# Patient Record
Sex: Female | Born: 1955 | Race: Black or African American | Hispanic: No | Smoking: Current every day smoker
Health system: Southern US, Community
[De-identification: ages and names within clinical notes are randomized; demographics above are authoritative.]

## PROBLEM LIST (undated history)

## (undated) DIAGNOSIS — K5790 Diverticulosis of intestine, part unspecified, without perforation or abscess without bleeding: Secondary | ICD-10-CM

## (undated) DIAGNOSIS — K529 Noninfective gastroenteritis and colitis, unspecified: Secondary | ICD-10-CM

## (undated) DIAGNOSIS — D126 Benign neoplasm of colon, unspecified: Secondary | ICD-10-CM

## (undated) DIAGNOSIS — K469 Unspecified abdominal hernia without obstruction or gangrene: Secondary | ICD-10-CM

## (undated) DIAGNOSIS — S82142A Displaced bicondylar fracture of left tibia, initial encounter for closed fracture: Secondary | ICD-10-CM

## (undated) DIAGNOSIS — E119 Type 2 diabetes mellitus without complications: Secondary | ICD-10-CM

## (undated) DIAGNOSIS — K627 Radiation proctitis: Secondary | ICD-10-CM

## (undated) DIAGNOSIS — E785 Hyperlipidemia, unspecified: Secondary | ICD-10-CM

## (undated) DIAGNOSIS — I10 Essential (primary) hypertension: Secondary | ICD-10-CM

## (undated) DIAGNOSIS — F1410 Cocaine abuse, uncomplicated: Secondary | ICD-10-CM

## (undated) DIAGNOSIS — C19 Malignant neoplasm of rectosigmoid junction: Secondary | ICD-10-CM

## (undated) DIAGNOSIS — Z9289 Personal history of other medical treatment: Secondary | ICD-10-CM

## (undated) DIAGNOSIS — G629 Polyneuropathy, unspecified: Secondary | ICD-10-CM

## (undated) DIAGNOSIS — F32A Depression, unspecified: Secondary | ICD-10-CM

## (undated) DIAGNOSIS — G479 Sleep disorder, unspecified: Secondary | ICD-10-CM

## (undated) DIAGNOSIS — R159 Full incontinence of feces: Secondary | ICD-10-CM

## (undated) DIAGNOSIS — D649 Anemia, unspecified: Secondary | ICD-10-CM

## (undated) DIAGNOSIS — IMO0002 Reserved for concepts with insufficient information to code with codable children: Secondary | ICD-10-CM

## (undated) DIAGNOSIS — F329 Major depressive disorder, single episode, unspecified: Secondary | ICD-10-CM

## (undated) HISTORY — DX: Unspecified abdominal hernia without obstruction or gangrene: K46.9

## (undated) HISTORY — DX: Anemia, unspecified: D64.9

## (undated) HISTORY — DX: Hyperlipidemia, unspecified: E78.5

## (undated) HISTORY — PX: PORTACATH PLACEMENT: SHX2246

## (undated) HISTORY — DX: Malignant neoplasm of rectosigmoid junction: C19

## (undated) HISTORY — DX: Displaced bicondylar fracture of left tibia, initial encounter for closed fracture: S82.142A

## (undated) HISTORY — DX: Benign neoplasm of colon, unspecified: D12.6

## (undated) HISTORY — DX: Polyneuropathy, unspecified: G62.9

## (undated) HISTORY — DX: Diverticulosis of intestine, part unspecified, without perforation or abscess without bleeding: K57.90

## (undated) HISTORY — DX: Essential (primary) hypertension: I10

## (undated) HISTORY — DX: Reserved for concepts with insufficient information to code with codable children: IMO0002

## (undated) HISTORY — DX: Cocaine abuse, uncomplicated: F14.10

## (undated) HISTORY — PX: HERNIA REPAIR: SHX51

## (undated) HISTORY — PX: FINGER FRACTURE SURGERY: SHX638

## (undated) HISTORY — DX: Noninfective gastroenteritis and colitis, unspecified: K52.9

## (undated) HISTORY — DX: Radiation proctitis: K62.7

---

## 2008-10-24 ENCOUNTER — Inpatient Hospital Stay (HOSPITAL_COMMUNITY): Admission: EM | Admit: 2008-10-24 | Discharge: 2008-10-27 | Payer: Self-pay | Admitting: Emergency Medicine

## 2008-10-25 ENCOUNTER — Encounter: Payer: Self-pay | Admitting: Internal Medicine

## 2008-10-25 ENCOUNTER — Encounter (INDEPENDENT_AMBULATORY_CARE_PROVIDER_SITE_OTHER): Payer: Self-pay | Admitting: Gastroenterology

## 2008-10-25 ENCOUNTER — Encounter (INDEPENDENT_AMBULATORY_CARE_PROVIDER_SITE_OTHER): Payer: Self-pay | Admitting: *Deleted

## 2008-10-27 ENCOUNTER — Encounter (INDEPENDENT_AMBULATORY_CARE_PROVIDER_SITE_OTHER): Payer: Self-pay | Admitting: *Deleted

## 2008-11-01 ENCOUNTER — Ambulatory Visit: Payer: Self-pay | Admitting: Oncology

## 2008-11-02 LAB — CBC WITH DIFFERENTIAL/PLATELET
BASO%: 0.7 % (ref 0.0–2.0)
Basophils Absolute: 0.1 10*3/uL (ref 0.0–0.1)
EOS%: 2.5 % (ref 0.0–7.0)
Eosinophils Absolute: 0.3 10*3/uL (ref 0.0–0.5)
HCT: 34.5 % — ABNORMAL LOW (ref 34.8–46.6)
HGB: 10.2 g/dL — ABNORMAL LOW (ref 11.6–15.9)
LYMPH%: 25.9 % (ref 14.0–49.7)
MCH: 20.6 pg — ABNORMAL LOW (ref 25.1–34.0)
MCHC: 29.6 g/dL — ABNORMAL LOW (ref 31.5–36.0)
MCV: 69.6 fL — ABNORMAL LOW (ref 79.5–101.0)
MONO#: 0.8 10*3/uL (ref 0.1–0.9)
MONO%: 8.4 % (ref 0.0–14.0)
NEUT#: 6.2 10*3/uL (ref 1.5–6.5)
NEUT%: 62.5 % (ref 38.4–76.8)
Platelets: 443 10*3/uL — ABNORMAL HIGH (ref 145–400)
RBC: 4.96 10*6/uL (ref 3.70–5.45)
RDW: 27.9 % — ABNORMAL HIGH (ref 11.2–14.5)
WBC: 9.9 10*3/uL (ref 3.9–10.3)
lymph#: 2.6 10*3/uL (ref 0.9–3.3)
nRBC: 0 % (ref 0–0)

## 2008-11-02 LAB — COMPREHENSIVE METABOLIC PANEL
ALT: 15 U/L (ref 0–35)
AST: 20 U/L (ref 0–37)
Albumin: 3.6 g/dL (ref 3.5–5.2)
Alkaline Phosphatase: 87 U/L (ref 39–117)
BUN: 9 mg/dL (ref 6–23)
CO2: 25 mEq/L (ref 19–32)
Calcium: 9 mg/dL (ref 8.4–10.5)
Chloride: 109 mEq/L (ref 96–112)
Creatinine, Ser: 0.69 mg/dL (ref 0.40–1.20)
Glucose, Bld: 101 mg/dL — ABNORMAL HIGH (ref 70–99)
Potassium: 4.2 mEq/L (ref 3.5–5.3)
Sodium: 141 mEq/L (ref 135–145)
Total Bilirubin: 1 mg/dL (ref 0.3–1.2)
Total Protein: 6.4 g/dL (ref 6.0–8.3)

## 2008-11-02 LAB — CEA: CEA: 1.2 ng/mL (ref 0.0–5.0)

## 2008-11-06 ENCOUNTER — Ambulatory Visit (HOSPITAL_COMMUNITY): Admission: RE | Admit: 2008-11-06 | Discharge: 2008-11-06 | Payer: Self-pay | Admitting: General Surgery

## 2008-11-07 ENCOUNTER — Ambulatory Visit: Admission: RE | Admit: 2008-11-07 | Discharge: 2008-12-30 | Payer: Self-pay | Admitting: Radiation Oncology

## 2008-11-13 ENCOUNTER — Ambulatory Visit (HOSPITAL_COMMUNITY): Admission: RE | Admit: 2008-11-13 | Discharge: 2008-11-13 | Payer: Self-pay | Admitting: Oncology

## 2008-11-27 LAB — CBC WITH DIFFERENTIAL/PLATELET
BASO%: 0.7 % (ref 0.0–2.0)
Basophils Absolute: 0 10*3/uL (ref 0.0–0.1)
EOS%: 2.7 % (ref 0.0–7.0)
Eosinophils Absolute: 0.2 10*3/uL (ref 0.0–0.5)
HCT: 32.4 % — ABNORMAL LOW (ref 34.8–46.6)
HGB: 9.7 g/dL — ABNORMAL LOW (ref 11.6–15.9)
LYMPH%: 24 % (ref 14.0–49.7)
MCH: 22.2 pg — ABNORMAL LOW (ref 25.1–34.0)
MCHC: 29.9 g/dL — ABNORMAL LOW (ref 31.5–36.0)
MCV: 74.1 fL — ABNORMAL LOW (ref 79.5–101.0)
MONO#: 0.4 10*3/uL (ref 0.1–0.9)
MONO%: 7.1 % (ref 0.0–14.0)
NEUT#: 3.7 10*3/uL (ref 1.5–6.5)
NEUT%: 65.5 % (ref 38.4–76.8)
Platelets: 360 10*3/uL (ref 145–400)
RBC: 4.37 10*6/uL (ref 3.70–5.45)
RDW: 27.4 % — ABNORMAL HIGH (ref 11.2–14.5)
WBC: 5.7 10*3/uL (ref 3.9–10.3)
lymph#: 1.4 10*3/uL (ref 0.9–3.3)

## 2008-11-27 LAB — COMPREHENSIVE METABOLIC PANEL
ALT: 13 U/L (ref 0–35)
AST: 16 U/L (ref 0–37)
Albumin: 4.1 g/dL (ref 3.5–5.2)
Alkaline Phosphatase: 90 U/L (ref 39–117)
BUN: 13 mg/dL (ref 6–23)
CO2: 22 mEq/L (ref 19–32)
Calcium: 9.6 mg/dL (ref 8.4–10.5)
Chloride: 108 mEq/L (ref 96–112)
Creatinine, Ser: 0.58 mg/dL (ref 0.40–1.20)
Glucose, Bld: 116 mg/dL — ABNORMAL HIGH (ref 70–99)
Potassium: 3.8 mEq/L (ref 3.5–5.3)
Sodium: 139 mEq/L (ref 135–145)
Total Bilirubin: 0.4 mg/dL (ref 0.3–1.2)
Total Protein: 6.4 g/dL (ref 6.0–8.3)

## 2008-12-04 ENCOUNTER — Ambulatory Visit: Payer: Self-pay | Admitting: Oncology

## 2008-12-04 LAB — CBC WITH DIFFERENTIAL/PLATELET
BASO%: 0.4 % (ref 0.0–2.0)
Basophils Absolute: 0 10*3/uL (ref 0.0–0.1)
EOS%: 2.5 % (ref 0.0–7.0)
Eosinophils Absolute: 0.1 10*3/uL (ref 0.0–0.5)
HCT: 31.7 % — ABNORMAL LOW (ref 34.8–46.6)
HGB: 9.8 g/dL — ABNORMAL LOW (ref 11.6–15.9)
LYMPH%: 14.6 % (ref 14.0–49.7)
MCH: 23.2 pg — ABNORMAL LOW (ref 25.1–34.0)
MCHC: 30.9 g/dL — ABNORMAL LOW (ref 31.5–36.0)
MCV: 75.1 fL — ABNORMAL LOW (ref 79.5–101.0)
MONO#: 0.5 10*3/uL (ref 0.1–0.9)
MONO%: 8.5 % (ref 0.0–14.0)
NEUT#: 4.1 10*3/uL (ref 1.5–6.5)
NEUT%: 74 % (ref 38.4–76.8)
Platelets: 257 10*3/uL (ref 145–400)
RBC: 4.22 10*6/uL (ref 3.70–5.45)
RDW: 27.8 % — ABNORMAL HIGH (ref 11.2–14.5)
WBC: 5.6 10*3/uL (ref 3.9–10.3)
lymph#: 0.8 10*3/uL — ABNORMAL LOW (ref 0.9–3.3)

## 2008-12-04 LAB — COMPREHENSIVE METABOLIC PANEL
ALT: 9 U/L (ref 0–35)
AST: 16 U/L (ref 0–37)
Albumin: 4 g/dL (ref 3.5–5.2)
Alkaline Phosphatase: 83 U/L (ref 39–117)
BUN: 13 mg/dL (ref 6–23)
CO2: 23 mEq/L (ref 19–32)
Calcium: 9.6 mg/dL (ref 8.4–10.5)
Chloride: 106 mEq/L (ref 96–112)
Creatinine, Ser: 0.65 mg/dL (ref 0.40–1.20)
Glucose, Bld: 146 mg/dL — ABNORMAL HIGH (ref 70–99)
Potassium: 4.2 mEq/L (ref 3.5–5.3)
Sodium: 140 mEq/L (ref 135–145)
Total Bilirubin: 0.6 mg/dL (ref 0.3–1.2)
Total Protein: 6.5 g/dL (ref 6.0–8.3)

## 2008-12-11 LAB — COMPREHENSIVE METABOLIC PANEL WITH GFR
ALT: 10 U/L (ref 0–35)
AST: 14 U/L (ref 0–37)
Albumin: 3.7 g/dL (ref 3.5–5.2)
Alkaline Phosphatase: 88 U/L (ref 39–117)
BUN: 13 mg/dL (ref 6–23)
CO2: 21 meq/L (ref 19–32)
Calcium: 9.3 mg/dL (ref 8.4–10.5)
Chloride: 108 meq/L (ref 96–112)
Creatinine, Ser: 0.67 mg/dL (ref 0.40–1.20)
Glucose, Bld: 99 mg/dL (ref 70–99)
Potassium: 4.2 meq/L (ref 3.5–5.3)
Sodium: 141 meq/L (ref 135–145)
Total Bilirubin: 0.4 mg/dL (ref 0.3–1.2)
Total Protein: 6.2 g/dL (ref 6.0–8.3)

## 2008-12-11 LAB — CBC WITH DIFFERENTIAL/PLATELET
BASO%: 0.3 % (ref 0.0–2.0)
Basophils Absolute: 0 10*3/uL (ref 0.0–0.1)
EOS%: 1.9 % (ref 0.0–7.0)
Eosinophils Absolute: 0.1 10*3/uL (ref 0.0–0.5)
HCT: 32.6 % — ABNORMAL LOW (ref 34.8–46.6)
HGB: 10 g/dL — ABNORMAL LOW (ref 11.6–15.9)
LYMPH%: 8.8 % — ABNORMAL LOW (ref 14.0–49.7)
MCH: 23.6 pg — ABNORMAL LOW (ref 25.1–34.0)
MCHC: 30.7 g/dL — ABNORMAL LOW (ref 31.5–36.0)
MCV: 77.1 fL — ABNORMAL LOW (ref 79.5–101.0)
MONO#: 0.6 10*3/uL (ref 0.1–0.9)
MONO%: 7.8 % (ref 0.0–14.0)
NEUT#: 5.8 10*3/uL (ref 1.5–6.5)
NEUT%: 81.2 % — ABNORMAL HIGH (ref 38.4–76.8)
Platelets: 374 10*3/uL (ref 145–400)
RBC: 4.23 10*6/uL (ref 3.70–5.45)
RDW: 28.2 % — ABNORMAL HIGH (ref 11.2–14.5)
WBC: 7.2 10*3/uL (ref 3.9–10.3)
lymph#: 0.6 10*3/uL — ABNORMAL LOW (ref 0.9–3.3)

## 2008-12-19 LAB — COMPREHENSIVE METABOLIC PANEL
ALT: 16 U/L (ref 0–35)
AST: 19 U/L (ref 0–37)
Albumin: 4 g/dL (ref 3.5–5.2)
Alkaline Phosphatase: 87 U/L (ref 39–117)
BUN: 13 mg/dL (ref 6–23)
CO2: 22 mEq/L (ref 19–32)
Calcium: 8.8 mg/dL (ref 8.4–10.5)
Chloride: 106 mEq/L (ref 96–112)
Creatinine, Ser: 0.84 mg/dL (ref 0.40–1.20)
Glucose, Bld: 121 mg/dL — ABNORMAL HIGH (ref 70–99)
Potassium: 3.5 mEq/L (ref 3.5–5.3)
Sodium: 141 mEq/L (ref 135–145)
Total Bilirubin: 0.9 mg/dL (ref 0.3–1.2)
Total Protein: 6.4 g/dL (ref 6.0–8.3)

## 2008-12-19 LAB — CBC WITH DIFFERENTIAL/PLATELET
BASO%: 0.7 % (ref 0.0–2.0)
Basophils Absolute: 0 10*3/uL (ref 0.0–0.1)
EOS%: 3 % (ref 0.0–7.0)
Eosinophils Absolute: 0.1 10*3/uL (ref 0.0–0.5)
HCT: 32.9 % — ABNORMAL LOW (ref 34.8–46.6)
HGB: 10.2 g/dL — ABNORMAL LOW (ref 11.6–15.9)
LYMPH%: 15.5 % (ref 14.0–49.7)
MCH: 24.5 pg — ABNORMAL LOW (ref 25.1–34.0)
MCHC: 31 g/dL — ABNORMAL LOW (ref 31.5–36.0)
MCV: 78.9 fL — ABNORMAL LOW (ref 79.5–101.0)
MONO#: 0.5 10*3/uL (ref 0.1–0.9)
MONO%: 10.9 % (ref 0.0–14.0)
NEUT#: 3.1 10*3/uL (ref 1.5–6.5)
NEUT%: 69.9 % (ref 38.4–76.8)
Platelets: 450 10*3/uL — ABNORMAL HIGH (ref 145–400)
RBC: 4.17 10*6/uL (ref 3.70–5.45)
RDW: 28.1 % — ABNORMAL HIGH (ref 11.2–14.5)
WBC: 4.4 10*3/uL (ref 3.9–10.3)
lymph#: 0.7 10*3/uL — ABNORMAL LOW (ref 0.9–3.3)

## 2008-12-25 LAB — CBC WITH DIFFERENTIAL/PLATELET
BASO%: 0.2 % (ref 0.0–2.0)
Basophils Absolute: 0 10*3/uL (ref 0.0–0.1)
EOS%: 1.4 % (ref 0.0–7.0)
Eosinophils Absolute: 0.1 10*3/uL (ref 0.0–0.5)
HCT: 35.4 % (ref 34.8–46.6)
HGB: 11 g/dL — ABNORMAL LOW (ref 11.6–15.9)
LYMPH%: 6.5 % — ABNORMAL LOW (ref 14.0–49.7)
MCH: 25.7 pg (ref 25.1–34.0)
MCHC: 31.1 g/dL — ABNORMAL LOW (ref 31.5–36.0)
MCV: 82.7 fL (ref 79.5–101.0)
MONO#: 0.7 10*3/uL (ref 0.1–0.9)
MONO%: 8 % (ref 0.0–14.0)
NEUT#: 7.3 10*3/uL — ABNORMAL HIGH (ref 1.5–6.5)
NEUT%: 83.9 % — ABNORMAL HIGH (ref 38.4–76.8)
Platelets: 312 10*3/uL (ref 145–400)
RBC: 4.28 10*6/uL (ref 3.70–5.45)
RDW: 28.3 % — ABNORMAL HIGH (ref 11.2–14.5)
WBC: 8.7 10*3/uL (ref 3.9–10.3)
lymph#: 0.6 10*3/uL — ABNORMAL LOW (ref 0.9–3.3)

## 2009-02-01 ENCOUNTER — Ambulatory Visit: Payer: Self-pay | Admitting: Oncology

## 2009-02-05 LAB — CBC WITH DIFFERENTIAL/PLATELET
BASO%: 0.1 % (ref 0.0–2.0)
Basophils Absolute: 0 10*3/uL (ref 0.0–0.1)
EOS%: 1.4 % (ref 0.0–7.0)
Eosinophils Absolute: 0.1 10*3/uL (ref 0.0–0.5)
HCT: 34.1 % — ABNORMAL LOW (ref 34.8–46.6)
HGB: 11 g/dL — ABNORMAL LOW (ref 11.6–15.9)
LYMPH%: 13.4 % — ABNORMAL LOW (ref 14.0–49.7)
MCH: 26.9 pg (ref 25.1–34.0)
MCHC: 32.2 g/dL (ref 31.5–36.0)
MCV: 83.5 fL (ref 79.5–101.0)
MONO#: 0.5 10*3/uL (ref 0.1–0.9)
MONO%: 7.7 % (ref 0.0–14.0)
NEUT#: 4.6 10*3/uL (ref 1.5–6.5)
NEUT%: 77.4 % — ABNORMAL HIGH (ref 38.4–76.8)
Platelets: 379 10*3/uL (ref 145–400)
RBC: 4.08 10*6/uL (ref 3.70–5.45)
RDW: 24 % — ABNORMAL HIGH (ref 11.2–14.5)
WBC: 5.9 10*3/uL (ref 3.9–10.3)
lymph#: 0.8 10*3/uL — ABNORMAL LOW (ref 0.9–3.3)

## 2009-02-05 LAB — COMPREHENSIVE METABOLIC PANEL
ALT: 17 U/L (ref 0–35)
AST: 18 U/L (ref 0–37)
Albumin: 4 g/dL (ref 3.5–5.2)
Alkaline Phosphatase: 90 U/L (ref 39–117)
BUN: 13 mg/dL (ref 6–23)
CO2: 22 mEq/L (ref 19–32)
Calcium: 9.3 mg/dL (ref 8.4–10.5)
Chloride: 108 mEq/L (ref 96–112)
Creatinine, Ser: 0.81 mg/dL (ref 0.40–1.20)
Glucose, Bld: 99 mg/dL (ref 70–99)
Potassium: 4.2 mEq/L (ref 3.5–5.3)
Sodium: 140 mEq/L (ref 135–145)
Total Bilirubin: 0.4 mg/dL (ref 0.3–1.2)
Total Protein: 6.8 g/dL (ref 6.0–8.3)

## 2009-02-05 LAB — CEA: CEA: 1.1 ng/mL (ref 0.0–5.0)

## 2009-03-07 ENCOUNTER — Encounter (INDEPENDENT_AMBULATORY_CARE_PROVIDER_SITE_OTHER): Payer: Self-pay | Admitting: General Surgery

## 2009-03-07 ENCOUNTER — Inpatient Hospital Stay (HOSPITAL_COMMUNITY): Admission: RE | Admit: 2009-03-07 | Discharge: 2009-03-12 | Payer: Self-pay | Admitting: General Surgery

## 2009-03-07 ENCOUNTER — Encounter (INDEPENDENT_AMBULATORY_CARE_PROVIDER_SITE_OTHER): Payer: Self-pay | Admitting: *Deleted

## 2009-03-07 HISTORY — PX: LOW ANTERIOR BOWEL RESECTION: SUR1240

## 2009-03-12 ENCOUNTER — Encounter (INDEPENDENT_AMBULATORY_CARE_PROVIDER_SITE_OTHER): Payer: Self-pay | Admitting: *Deleted

## 2009-04-02 ENCOUNTER — Ambulatory Visit: Payer: Self-pay | Admitting: Oncology

## 2009-04-04 LAB — CBC WITH DIFFERENTIAL/PLATELET
BASO%: 0.3 % (ref 0.0–2.0)
Basophils Absolute: 0 10*3/uL (ref 0.0–0.1)
EOS%: 0.9 % (ref 0.0–7.0)
Eosinophils Absolute: 0.1 10*3/uL (ref 0.0–0.5)
HCT: 32.7 % — ABNORMAL LOW (ref 34.8–46.6)
HGB: 9.6 g/dL — ABNORMAL LOW (ref 11.6–15.9)
LYMPH%: 12.7 % — ABNORMAL LOW (ref 14.0–49.7)
MCH: 23.6 pg — ABNORMAL LOW (ref 25.1–34.0)
MCHC: 29.4 g/dL — ABNORMAL LOW (ref 31.5–36.0)
MCV: 80.3 fL (ref 79.5–101.0)
MONO#: 0.6 10*3/uL (ref 0.1–0.9)
MONO%: 8.3 % (ref 0.0–14.0)
NEUT#: 5.3 10*3/uL (ref 1.5–6.5)
NEUT%: 77.8 % — ABNORMAL HIGH (ref 38.4–76.8)
Platelets: 469 10*3/uL — ABNORMAL HIGH (ref 145–400)
RBC: 4.07 10*6/uL (ref 3.70–5.45)
RDW: 18.4 % — ABNORMAL HIGH (ref 11.2–14.5)
WBC: 6.8 10*3/uL (ref 3.9–10.3)
lymph#: 0.9 10*3/uL (ref 0.9–3.3)

## 2009-04-04 LAB — COMPREHENSIVE METABOLIC PANEL
ALT: 8 U/L (ref 0–35)
AST: 13 U/L (ref 0–37)
Albumin: 4.2 g/dL (ref 3.5–5.2)
Alkaline Phosphatase: 100 U/L (ref 39–117)
BUN: 8 mg/dL (ref 6–23)
CO2: 24 mEq/L (ref 19–32)
Calcium: 9.7 mg/dL (ref 8.4–10.5)
Chloride: 105 mEq/L (ref 96–112)
Creatinine, Ser: 0.68 mg/dL (ref 0.40–1.20)
Glucose, Bld: 113 mg/dL — ABNORMAL HIGH (ref 70–99)
Potassium: 3.6 mEq/L (ref 3.5–5.3)
Sodium: 141 mEq/L (ref 135–145)
Total Bilirubin: 0.9 mg/dL (ref 0.3–1.2)
Total Protein: 7.4 g/dL (ref 6.0–8.3)

## 2009-04-04 LAB — CEA: CEA: 0.9 ng/mL (ref 0.0–5.0)

## 2009-04-14 ENCOUNTER — Ambulatory Visit: Payer: Self-pay | Admitting: Oncology

## 2009-04-14 LAB — HM MAMMOGRAPHY: HM Mammogram: NORMAL

## 2009-04-14 LAB — CONVERTED CEMR LAB

## 2009-04-16 LAB — CBC WITH DIFFERENTIAL/PLATELET
BASO%: 0.8 % (ref 0.0–2.0)
Basophils Absolute: 0.1 10*3/uL (ref 0.0–0.1)
EOS%: 2.7 % (ref 0.0–7.0)
Eosinophils Absolute: 0.2 10*3/uL (ref 0.0–0.5)
HCT: 35.6 % (ref 34.8–46.6)
HGB: 10.8 g/dL — ABNORMAL LOW (ref 11.6–15.9)
LYMPH%: 15.6 % (ref 14.0–49.7)
MCH: 24.2 pg — ABNORMAL LOW (ref 25.1–34.0)
MCHC: 30.3 g/dL — ABNORMAL LOW (ref 31.5–36.0)
MCV: 79.6 fL (ref 79.5–101.0)
MONO#: 0.5 10*3/uL (ref 0.1–0.9)
MONO%: 8.2 % (ref 0.0–14.0)
NEUT#: 4.8 10*3/uL (ref 1.5–6.5)
NEUT%: 72.7 % (ref 38.4–76.8)
Platelets: 543 10*3/uL — ABNORMAL HIGH (ref 145–400)
RBC: 4.47 10*6/uL (ref 3.70–5.45)
RDW: 20.2 % — ABNORMAL HIGH (ref 11.2–14.5)
WBC: 6.6 10*3/uL (ref 3.9–10.3)
lymph#: 1 10*3/uL (ref 0.9–3.3)
nRBC: 0 % (ref 0–0)

## 2009-04-17 LAB — COMPREHENSIVE METABOLIC PANEL
ALT: 8 U/L (ref 0–35)
AST: 20 U/L (ref 0–37)
Albumin: 4.2 g/dL (ref 3.5–5.2)
Alkaline Phosphatase: 96 U/L (ref 39–117)
BUN: 9 mg/dL (ref 6–23)
CO2: 18 mEq/L — ABNORMAL LOW (ref 19–32)
Calcium: 9.6 mg/dL (ref 8.4–10.5)
Chloride: 103 mEq/L (ref 96–112)
Creatinine, Ser: 0.67 mg/dL (ref 0.40–1.20)
Glucose, Bld: 104 mg/dL — ABNORMAL HIGH (ref 70–99)
Potassium: 4.5 mEq/L (ref 3.5–5.3)
Sodium: 138 mEq/L (ref 135–145)
Total Bilirubin: 0.7 mg/dL (ref 0.3–1.2)
Total Protein: 7 g/dL (ref 6.0–8.3)

## 2009-04-17 LAB — CEA: CEA: 0.6 ng/mL (ref 0.0–5.0)

## 2009-04-17 LAB — LACTATE DEHYDROGENASE: LDH: 234 U/L (ref 94–250)

## 2009-04-30 LAB — CBC WITH DIFFERENTIAL/PLATELET
BASO%: 0 % (ref 0.0–2.0)
Basophils Absolute: 0 10*3/uL (ref 0.0–0.1)
EOS%: 0.8 % (ref 0.0–7.0)
Eosinophils Absolute: 0 10*3/uL (ref 0.0–0.5)
HCT: 36.6 % (ref 34.8–46.6)
HGB: 11.6 g/dL (ref 11.6–15.9)
LYMPH%: 14.9 % (ref 14.0–49.7)
MCH: 25.9 pg (ref 25.1–34.0)
MCHC: 31.7 g/dL (ref 31.5–36.0)
MCV: 81.8 fL (ref 79.5–101.0)
MONO#: 0.6 10*3/uL (ref 0.1–0.9)
MONO%: 11.7 % (ref 0.0–14.0)
NEUT#: 4 10*3/uL (ref 1.5–6.5)
NEUT%: 72.6 % (ref 38.4–76.8)
Platelets: 387 10*3/uL (ref 145–400)
RBC: 4.47 10*6/uL (ref 3.70–5.45)
RDW: 24.9 % — ABNORMAL HIGH (ref 11.2–14.5)
WBC: 5.6 10*3/uL (ref 3.9–10.3)
lymph#: 0.8 10*3/uL — ABNORMAL LOW (ref 0.9–3.3)

## 2009-04-30 LAB — COMPREHENSIVE METABOLIC PANEL
ALT: 8 U/L (ref 0–35)
AST: 14 U/L (ref 0–37)
Albumin: 4.2 g/dL (ref 3.5–5.2)
Alkaline Phosphatase: 95 U/L (ref 39–117)
BUN: 10 mg/dL (ref 6–23)
CO2: 23 mEq/L (ref 19–32)
Calcium: 9.7 mg/dL (ref 8.4–10.5)
Chloride: 104 mEq/L (ref 96–112)
Creatinine, Ser: 0.74 mg/dL (ref 0.40–1.20)
Glucose, Bld: 138 mg/dL — ABNORMAL HIGH (ref 70–99)
Potassium: 4.3 mEq/L (ref 3.5–5.3)
Sodium: 139 mEq/L (ref 135–145)
Total Bilirubin: 0.7 mg/dL (ref 0.3–1.2)
Total Protein: 6.8 g/dL (ref 6.0–8.3)

## 2009-05-14 ENCOUNTER — Ambulatory Visit: Payer: Self-pay | Admitting: Oncology

## 2009-05-14 LAB — COMPREHENSIVE METABOLIC PANEL
ALT: 11 U/L (ref 0–35)
AST: 18 U/L (ref 0–37)
Albumin: 3.7 g/dL (ref 3.5–5.2)
Alkaline Phosphatase: 103 U/L (ref 39–117)
BUN: 9 mg/dL (ref 6–23)
CO2: 19 mEq/L (ref 19–32)
Calcium: 8.9 mg/dL (ref 8.4–10.5)
Chloride: 106 mEq/L (ref 96–112)
Creatinine, Ser: 0.62 mg/dL (ref 0.40–1.20)
Glucose, Bld: 104 mg/dL — ABNORMAL HIGH (ref 70–99)
Potassium: 3.6 mEq/L (ref 3.5–5.3)
Sodium: 139 mEq/L (ref 135–145)
Total Bilirubin: 0.5 mg/dL (ref 0.3–1.2)
Total Protein: 6.4 g/dL (ref 6.0–8.3)

## 2009-05-14 LAB — CBC WITH DIFFERENTIAL/PLATELET
BASO%: 0.4 % (ref 0.0–2.0)
Basophils Absolute: 0 10*3/uL (ref 0.0–0.1)
EOS%: 1.1 % (ref 0.0–7.0)
Eosinophils Absolute: 0.1 10*3/uL (ref 0.0–0.5)
HCT: 36.1 % (ref 34.8–46.6)
HGB: 11.1 g/dL — ABNORMAL LOW (ref 11.6–15.9)
LYMPH%: 14.5 % (ref 14.0–49.7)
MCH: 25.8 pg (ref 25.1–34.0)
MCHC: 30.7 g/dL — ABNORMAL LOW (ref 31.5–36.0)
MCV: 84 fL (ref 79.5–101.0)
MONO#: 0.7 10*3/uL (ref 0.1–0.9)
MONO%: 12.4 % (ref 0.0–14.0)
NEUT#: 4 10*3/uL (ref 1.5–6.5)
NEUT%: 71.6 % (ref 38.4–76.8)
Platelets: 218 10*3/uL (ref 145–400)
RBC: 4.3 10*6/uL (ref 3.70–5.45)
RDW: 22.6 % — ABNORMAL HIGH (ref 11.2–14.5)
WBC: 5.6 10*3/uL (ref 3.9–10.3)
lymph#: 0.8 10*3/uL — ABNORMAL LOW (ref 0.9–3.3)

## 2009-05-28 LAB — CBC WITH DIFFERENTIAL/PLATELET
BASO%: 0.5 % (ref 0.0–2.0)
Basophils Absolute: 0 10*3/uL (ref 0.0–0.1)
EOS%: 0.9 % (ref 0.0–7.0)
Eosinophils Absolute: 0.1 10*3/uL (ref 0.0–0.5)
HCT: 39.3 % (ref 34.8–46.6)
HGB: 12.4 g/dL (ref 11.6–15.9)
LYMPH%: 10.3 % — ABNORMAL LOW (ref 14.0–49.7)
MCH: 26.5 pg (ref 25.1–34.0)
MCHC: 31.6 g/dL (ref 31.5–36.0)
MCV: 84 fL (ref 79.5–101.0)
MONO#: 0.5 10*3/uL (ref 0.1–0.9)
MONO%: 9.2 % (ref 0.0–14.0)
NEUT#: 4.5 10*3/uL (ref 1.5–6.5)
NEUT%: 79.1 % — ABNORMAL HIGH (ref 38.4–76.8)
Platelets: 207 10*3/uL (ref 145–400)
RBC: 4.68 10*6/uL (ref 3.70–5.45)
RDW: 23.3 % — ABNORMAL HIGH (ref 11.2–14.5)
WBC: 5.7 10*3/uL (ref 3.9–10.3)
lymph#: 0.6 10*3/uL — ABNORMAL LOW (ref 0.9–3.3)
nRBC: 0 % (ref 0–0)

## 2009-05-28 LAB — COMPREHENSIVE METABOLIC PANEL
ALT: 11 U/L (ref 0–35)
AST: 22 U/L (ref 0–37)
Albumin: 4 g/dL (ref 3.5–5.2)
Alkaline Phosphatase: 96 U/L (ref 39–117)
BUN: 4 mg/dL — ABNORMAL LOW (ref 6–23)
CO2: 20 mEq/L (ref 19–32)
Calcium: 9 mg/dL (ref 8.4–10.5)
Chloride: 100 mEq/L (ref 96–112)
Creatinine, Ser: 0.73 mg/dL (ref 0.40–1.20)
Glucose, Bld: 111 mg/dL — ABNORMAL HIGH (ref 70–99)
Potassium: 3.3 mEq/L — ABNORMAL LOW (ref 3.5–5.3)
Sodium: 134 mEq/L — ABNORMAL LOW (ref 135–145)
Total Bilirubin: 0.6 mg/dL (ref 0.3–1.2)
Total Protein: 6.8 g/dL (ref 6.0–8.3)

## 2009-06-11 LAB — CBC WITH DIFFERENTIAL/PLATELET
BASO%: 2.6 % — ABNORMAL HIGH (ref 0.0–2.0)
Basophils Absolute: 0.1 10*3/uL (ref 0.0–0.1)
EOS%: 2.2 % (ref 0.0–7.0)
Eosinophils Absolute: 0.1 10*3/uL (ref 0.0–0.5)
HCT: 34.3 % — ABNORMAL LOW (ref 34.8–46.6)
HGB: 10.7 g/dL — ABNORMAL LOW (ref 11.6–15.9)
LYMPH%: 33.3 % (ref 14.0–49.7)
MCH: 27 pg (ref 25.1–34.0)
MCHC: 31.2 g/dL — ABNORMAL LOW (ref 31.5–36.0)
MCV: 86.6 fL (ref 79.5–101.0)
MONO#: 0.6 10*3/uL (ref 0.1–0.9)
MONO%: 25 % — ABNORMAL HIGH (ref 0.0–14.0)
NEUT#: 0.8 10*3/uL — ABNORMAL LOW (ref 1.5–6.5)
NEUT%: 36.9 % — ABNORMAL LOW (ref 38.4–76.8)
Platelets: 199 10*3/uL (ref 145–400)
RBC: 3.96 10*6/uL (ref 3.70–5.45)
RDW: 23.6 % — ABNORMAL HIGH (ref 11.2–14.5)
WBC: 2.3 10*3/uL — ABNORMAL LOW (ref 3.9–10.3)
lymph#: 0.8 10*3/uL — ABNORMAL LOW (ref 0.9–3.3)
nRBC: 0 % (ref 0–0)

## 2009-06-11 LAB — TECHNOLOGIST REVIEW

## 2009-06-13 ENCOUNTER — Ambulatory Visit: Payer: Self-pay | Admitting: Oncology

## 2009-06-18 LAB — CBC WITH DIFFERENTIAL/PLATELET
BASO%: 2.1 % — ABNORMAL HIGH (ref 0.0–2.0)
Basophils Absolute: 0.1 10*3/uL (ref 0.0–0.1)
EOS%: 1.7 % (ref 0.0–7.0)
Eosinophils Absolute: 0.1 10*3/uL (ref 0.0–0.5)
HCT: 37.7 % (ref 34.8–46.6)
HGB: 11.7 g/dL (ref 11.6–15.9)
LYMPH%: 21.3 % (ref 14.0–49.7)
MCH: 27.4 pg (ref 25.1–34.0)
MCHC: 31 g/dL — ABNORMAL LOW (ref 31.5–36.0)
MCV: 88.3 fL (ref 79.5–101.0)
MONO#: 1.1 10*3/uL — ABNORMAL HIGH (ref 0.1–0.9)
MONO%: 22.3 % — ABNORMAL HIGH (ref 0.0–14.0)
NEUT#: 2.5 10*3/uL (ref 1.5–6.5)
NEUT%: 52.6 % (ref 38.4–76.8)
Platelets: 481 10*3/uL — ABNORMAL HIGH (ref 145–400)
RBC: 4.27 10*6/uL (ref 3.70–5.45)
RDW: 25.1 % — ABNORMAL HIGH (ref 11.2–14.5)
WBC: 4.7 10*3/uL (ref 3.9–10.3)
lymph#: 1 10*3/uL (ref 0.9–3.3)

## 2009-06-18 LAB — COMPREHENSIVE METABOLIC PANEL
ALT: 12 U/L (ref 0–35)
AST: 27 U/L (ref 0–37)
Albumin: 3.8 g/dL (ref 3.5–5.2)
Alkaline Phosphatase: 102 U/L (ref 39–117)
BUN: 8 mg/dL (ref 6–23)
CO2: 24 mEq/L (ref 19–32)
Calcium: 9.6 mg/dL (ref 8.4–10.5)
Chloride: 103 mEq/L (ref 96–112)
Creatinine, Ser: 0.64 mg/dL (ref 0.40–1.20)
Glucose, Bld: 109 mg/dL — ABNORMAL HIGH (ref 70–99)
Potassium: 3.8 mEq/L (ref 3.5–5.3)
Sodium: 140 mEq/L (ref 135–145)
Total Bilirubin: 0.4 mg/dL (ref 0.3–1.2)
Total Protein: 6.8 g/dL (ref 6.0–8.3)

## 2009-06-18 LAB — TECHNOLOGIST REVIEW

## 2009-07-02 LAB — COMPREHENSIVE METABOLIC PANEL
ALT: 9 U/L (ref 0–35)
AST: 21 U/L (ref 0–37)
Albumin: 4.2 g/dL (ref 3.5–5.2)
Alkaline Phosphatase: 168 U/L — ABNORMAL HIGH (ref 39–117)
BUN: 14 mg/dL (ref 6–23)
CO2: 21 mEq/L (ref 19–32)
Calcium: 9.6 mg/dL (ref 8.4–10.5)
Chloride: 103 mEq/L (ref 96–112)
Creatinine, Ser: 0.84 mg/dL (ref 0.40–1.20)
Glucose, Bld: 117 mg/dL — ABNORMAL HIGH (ref 70–99)
Potassium: 3.7 mEq/L (ref 3.5–5.3)
Sodium: 139 mEq/L (ref 135–145)
Total Bilirubin: 0.6 mg/dL (ref 0.3–1.2)
Total Protein: 7.1 g/dL (ref 6.0–8.3)

## 2009-07-02 LAB — CBC WITH DIFFERENTIAL/PLATELET
BASO%: 0.5 % (ref 0.0–2.0)
Basophils Absolute: 0.1 10*3/uL (ref 0.0–0.1)
EOS%: 1 % (ref 0.0–7.0)
Eosinophils Absolute: 0.1 10*3/uL (ref 0.0–0.5)
HCT: 39.9 % (ref 34.8–46.6)
HGB: 12.6 g/dL (ref 11.6–15.9)
LYMPH%: 7.8 % — ABNORMAL LOW (ref 14.0–49.7)
MCH: 28.8 pg (ref 25.1–34.0)
MCHC: 31.6 g/dL (ref 31.5–36.0)
MCV: 91.1 fL (ref 79.5–101.0)
MONO#: 1.1 10*3/uL — ABNORMAL HIGH (ref 0.1–0.9)
MONO%: 8.3 % (ref 0.0–14.0)
NEUT#: 10.5 10*3/uL — ABNORMAL HIGH (ref 1.5–6.5)
NEUT%: 82.4 % — ABNORMAL HIGH (ref 38.4–76.8)
Platelets: 177 10*3/uL (ref 145–400)
RBC: 4.38 10*6/uL (ref 3.70–5.45)
RDW: 25.3 % — ABNORMAL HIGH (ref 11.2–14.5)
WBC: 12.8 10*3/uL — ABNORMAL HIGH (ref 3.9–10.3)
lymph#: 1 10*3/uL (ref 0.9–3.3)
nRBC: 0 % (ref 0–0)

## 2009-07-16 ENCOUNTER — Ambulatory Visit: Payer: Self-pay | Admitting: Oncology

## 2009-07-16 LAB — CBC WITH DIFFERENTIAL/PLATELET
BASO%: 0.3 % (ref 0.0–2.0)
Basophils Absolute: 0 10*3/uL (ref 0.0–0.1)
EOS%: 1.3 % (ref 0.0–7.0)
Eosinophils Absolute: 0.1 10*3/uL (ref 0.0–0.5)
HCT: 38.3 % (ref 34.8–46.6)
HGB: 12.2 g/dL (ref 11.6–15.9)
LYMPH%: 7.7 % — ABNORMAL LOW (ref 14.0–49.7)
MCH: 29.6 pg (ref 25.1–34.0)
MCHC: 31.9 g/dL (ref 31.5–36.0)
MCV: 93 fL (ref 79.5–101.0)
MONO#: 1 10*3/uL — ABNORMAL HIGH (ref 0.1–0.9)
MONO%: 10.4 % (ref 0.0–14.0)
NEUT#: 7.4 10*3/uL — ABNORMAL HIGH (ref 1.5–6.5)
NEUT%: 80.3 % — ABNORMAL HIGH (ref 38.4–76.8)
Platelets: 149 10*3/uL (ref 145–400)
RBC: 4.12 10*6/uL (ref 3.70–5.45)
RDW: 23 % — ABNORMAL HIGH (ref 11.2–14.5)
WBC: 9.2 10*3/uL (ref 3.9–10.3)
lymph#: 0.7 10*3/uL — ABNORMAL LOW (ref 0.9–3.3)

## 2009-07-16 LAB — COMPREHENSIVE METABOLIC PANEL
ALT: 11 U/L (ref 0–35)
AST: 22 U/L (ref 0–37)
Albumin: 3.9 g/dL (ref 3.5–5.2)
Alkaline Phosphatase: 154 U/L — ABNORMAL HIGH (ref 39–117)
BUN: 17 mg/dL (ref 6–23)
CO2: 21 mEq/L (ref 19–32)
Calcium: 9.1 mg/dL (ref 8.4–10.5)
Chloride: 104 mEq/L (ref 96–112)
Creatinine, Ser: 0.77 mg/dL (ref 0.40–1.20)
Glucose, Bld: 172 mg/dL — ABNORMAL HIGH (ref 70–99)
Potassium: 3.2 mEq/L — ABNORMAL LOW (ref 3.5–5.3)
Sodium: 139 mEq/L (ref 135–145)
Total Bilirubin: 0.6 mg/dL (ref 0.3–1.2)
Total Protein: 6.5 g/dL (ref 6.0–8.3)

## 2009-07-23 LAB — CBC WITH DIFFERENTIAL/PLATELET
BASO%: 0.2 % (ref 0.0–2.0)
Basophils Absolute: 0 10*3/uL (ref 0.0–0.1)
EOS%: 1.1 % (ref 0.0–7.0)
Eosinophils Absolute: 0.1 10*3/uL (ref 0.0–0.5)
HCT: 35.2 % (ref 34.8–46.6)
HGB: 11.5 g/dL — ABNORMAL LOW (ref 11.6–15.9)
LYMPH%: 7.8 % — ABNORMAL LOW (ref 14.0–49.7)
MCH: 30.3 pg (ref 25.1–34.0)
MCHC: 32.7 g/dL (ref 31.5–36.0)
MCV: 92.6 fL (ref 79.5–101.0)
MONO#: 1.5 10*3/uL — ABNORMAL HIGH (ref 0.1–0.9)
MONO%: 12.2 % (ref 0.0–14.0)
NEUT#: 9.4 10*3/uL — ABNORMAL HIGH (ref 1.5–6.5)
NEUT%: 78.7 % — ABNORMAL HIGH (ref 38.4–76.8)
Platelets: 152 10*3/uL (ref 145–400)
RBC: 3.8 10*6/uL (ref 3.70–5.45)
RDW: 21.1 % — ABNORMAL HIGH (ref 11.2–14.5)
WBC: 11.9 10*3/uL — ABNORMAL HIGH (ref 3.9–10.3)
lymph#: 0.9 10*3/uL (ref 0.9–3.3)

## 2009-07-30 LAB — CBC WITH DIFFERENTIAL/PLATELET
BASO%: 0.4 % (ref 0.0–2.0)
Basophils Absolute: 0 10*3/uL (ref 0.0–0.1)
EOS%: 1.6 % (ref 0.0–7.0)
Eosinophils Absolute: 0.1 10*3/uL (ref 0.0–0.5)
HCT: 38.3 % (ref 34.8–46.6)
HGB: 12.3 g/dL (ref 11.6–15.9)
LYMPH%: 15.2 % (ref 14.0–49.7)
MCH: 30.1 pg (ref 25.1–34.0)
MCHC: 32.1 g/dL (ref 31.5–36.0)
MCV: 93.9 fL (ref 79.5–101.0)
MONO#: 0.9 10*3/uL (ref 0.1–0.9)
MONO%: 12.2 % (ref 0.0–14.0)
NEUT#: 4.9 10*3/uL (ref 1.5–6.5)
NEUT%: 70.6 % (ref 38.4–76.8)
Platelets: 196 10*3/uL (ref 145–400)
RBC: 4.08 10*6/uL (ref 3.70–5.45)
RDW: 20.2 % — ABNORMAL HIGH (ref 11.2–14.5)
WBC: 7 10*3/uL (ref 3.9–10.3)
lymph#: 1.1 10*3/uL (ref 0.9–3.3)
nRBC: 0 % (ref 0–0)

## 2009-07-30 LAB — COMPREHENSIVE METABOLIC PANEL
ALT: 9 U/L (ref 0–35)
AST: 24 U/L (ref 0–37)
Albumin: 4.1 g/dL (ref 3.5–5.2)
Alkaline Phosphatase: 194 U/L — ABNORMAL HIGH (ref 39–117)
BUN: 11 mg/dL (ref 6–23)
CO2: 22 mEq/L (ref 19–32)
Calcium: 9.1 mg/dL (ref 8.4–10.5)
Chloride: 102 mEq/L (ref 96–112)
Creatinine, Ser: 0.76 mg/dL (ref 0.40–1.20)
Glucose, Bld: 111 mg/dL — ABNORMAL HIGH (ref 70–99)
Potassium: 3.7 mEq/L (ref 3.5–5.3)
Sodium: 136 mEq/L (ref 135–145)
Total Bilirubin: 0.8 mg/dL (ref 0.3–1.2)
Total Protein: 7 g/dL (ref 6.0–8.3)

## 2009-07-30 LAB — MAGNESIUM: Magnesium: 2 mg/dL (ref 1.5–2.5)

## 2009-08-07 ENCOUNTER — Ambulatory Visit: Payer: Self-pay | Admitting: Dentistry

## 2009-08-07 ENCOUNTER — Encounter: Admission: RE | Admit: 2009-08-07 | Discharge: 2009-08-07 | Payer: Self-pay | Admitting: Dentistry

## 2009-08-13 LAB — COMPREHENSIVE METABOLIC PANEL
ALT: 10 U/L (ref 0–35)
AST: 22 U/L (ref 0–37)
Albumin: 3.3 g/dL — ABNORMAL LOW (ref 3.5–5.2)
Alkaline Phosphatase: 149 U/L — ABNORMAL HIGH (ref 39–117)
BUN: 12 mg/dL (ref 6–23)
CO2: 17 mEq/L — ABNORMAL LOW (ref 19–32)
Calcium: 7.5 mg/dL — ABNORMAL LOW (ref 8.4–10.5)
Chloride: 111 mEq/L (ref 96–112)
Creatinine, Ser: 0.69 mg/dL (ref 0.40–1.20)
Glucose, Bld: 87 mg/dL (ref 70–99)
Potassium: 2.9 mEq/L — ABNORMAL LOW (ref 3.5–5.3)
Sodium: 142 mEq/L (ref 135–145)
Total Bilirubin: 0.5 mg/dL (ref 0.3–1.2)
Total Protein: 5.7 g/dL — ABNORMAL LOW (ref 6.0–8.3)

## 2009-08-13 LAB — CBC WITH DIFFERENTIAL/PLATELET
BASO%: 0.4 % (ref 0.0–2.0)
Basophils Absolute: 0 10*3/uL (ref 0.0–0.1)
EOS%: 0.8 % (ref 0.0–7.0)
Eosinophils Absolute: 0.1 10*3/uL (ref 0.0–0.5)
HCT: 37.3 % (ref 34.8–46.6)
HGB: 12 g/dL (ref 11.6–15.9)
LYMPH%: 6.9 % — ABNORMAL LOW (ref 14.0–49.7)
MCH: 31.1 pg (ref 25.1–34.0)
MCHC: 32.2 g/dL (ref 31.5–36.0)
MCV: 96.6 fL (ref 79.5–101.0)
MONO#: 1.4 10*3/uL — ABNORMAL HIGH (ref 0.1–0.9)
MONO%: 15.5 % — ABNORMAL HIGH (ref 0.0–14.0)
NEUT#: 7.1 10*3/uL — ABNORMAL HIGH (ref 1.5–6.5)
NEUT%: 76.4 % (ref 38.4–76.8)
Platelets: 160 10*3/uL (ref 145–400)
RBC: 3.86 10*6/uL (ref 3.70–5.45)
RDW: 19.9 % — ABNORMAL HIGH (ref 11.2–14.5)
WBC: 9.3 10*3/uL (ref 3.9–10.3)
lymph#: 0.6 10*3/uL — ABNORMAL LOW (ref 0.9–3.3)

## 2009-08-15 ENCOUNTER — Ambulatory Visit: Payer: Self-pay | Admitting: Oncology

## 2009-08-23 ENCOUNTER — Ambulatory Visit: Payer: Self-pay | Admitting: Dentistry

## 2009-08-23 LAB — CBC WITH DIFFERENTIAL/PLATELET
BASO%: 0.4 % (ref 0.0–2.0)
Basophils Absolute: 0.1 10*3/uL (ref 0.0–0.1)
EOS%: 0.7 % (ref 0.0–7.0)
Eosinophils Absolute: 0.1 10*3/uL (ref 0.0–0.5)
HCT: 35.4 % (ref 34.8–46.6)
HGB: 11.7 g/dL (ref 11.6–15.9)
LYMPH%: 3.9 % — ABNORMAL LOW (ref 14.0–49.7)
MCH: 31.5 pg (ref 25.1–34.0)
MCHC: 33.1 g/dL (ref 31.5–36.0)
MCV: 95.2 fL (ref 79.5–101.0)
MONO#: 2 10*3/uL — ABNORMAL HIGH (ref 0.1–0.9)
MONO%: 12.8 % (ref 0.0–14.0)
NEUT#: 12.6 10*3/uL — ABNORMAL HIGH (ref 1.5–6.5)
NEUT%: 82.2 % — ABNORMAL HIGH (ref 38.4–76.8)
Platelets: 142 10*3/uL — ABNORMAL LOW (ref 145–400)
RBC: 3.72 10*6/uL (ref 3.70–5.45)
RDW: 18.9 % — ABNORMAL HIGH (ref 11.2–14.5)
WBC: 15.3 10*3/uL — ABNORMAL HIGH (ref 3.9–10.3)
lymph#: 0.6 10*3/uL — ABNORMAL LOW (ref 0.9–3.3)

## 2009-08-27 LAB — CBC WITH DIFFERENTIAL/PLATELET
BASO%: 0.2 % (ref 0.0–2.0)
Basophils Absolute: 0 10*3/uL (ref 0.0–0.1)
EOS%: 1.5 % (ref 0.0–7.0)
Eosinophils Absolute: 0.1 10*3/uL (ref 0.0–0.5)
HCT: 35.6 % (ref 34.8–46.6)
HGB: 11.7 g/dL (ref 11.6–15.9)
LYMPH%: 8.4 % — ABNORMAL LOW (ref 14.0–49.7)
MCH: 31.6 pg (ref 25.1–34.0)
MCHC: 32.9 g/dL (ref 31.5–36.0)
MCV: 96.2 fL (ref 79.5–101.0)
MONO#: 1.4 10*3/uL — ABNORMAL HIGH (ref 0.1–0.9)
MONO%: 15 % — ABNORMAL HIGH (ref 0.0–14.0)
NEUT#: 6.9 10*3/uL — ABNORMAL HIGH (ref 1.5–6.5)
NEUT%: 74.9 % (ref 38.4–76.8)
Platelets: 128 10*3/uL — ABNORMAL LOW (ref 145–400)
RBC: 3.7 10*6/uL (ref 3.70–5.45)
RDW: 18.6 % — ABNORMAL HIGH (ref 11.2–14.5)
WBC: 9.3 10*3/uL (ref 3.9–10.3)
lymph#: 0.8 10*3/uL — ABNORMAL LOW (ref 0.9–3.3)

## 2009-08-27 LAB — COMPREHENSIVE METABOLIC PANEL
ALT: 12 U/L (ref 0–35)
AST: 29 U/L (ref 0–37)
Albumin: 4 g/dL (ref 3.5–5.2)
Alkaline Phosphatase: 190 U/L — ABNORMAL HIGH (ref 39–117)
BUN: 14 mg/dL (ref 6–23)
CO2: 23 mEq/L (ref 19–32)
Calcium: 9.1 mg/dL (ref 8.4–10.5)
Chloride: 101 mEq/L (ref 96–112)
Creatinine, Ser: 0.77 mg/dL (ref 0.40–1.20)
Glucose, Bld: 102 mg/dL — ABNORMAL HIGH (ref 70–99)
Potassium: 3.3 mEq/L — ABNORMAL LOW (ref 3.5–5.3)
Sodium: 138 mEq/L (ref 135–145)
Total Bilirubin: 1 mg/dL (ref 0.3–1.2)
Total Protein: 7.1 g/dL (ref 6.0–8.3)

## 2009-09-12 LAB — CBC WITH DIFFERENTIAL/PLATELET
BASO%: 0.3 % (ref 0.0–2.0)
Basophils Absolute: 0 10*3/uL (ref 0.0–0.1)
EOS%: 1.1 % (ref 0.0–7.0)
Eosinophils Absolute: 0.1 10*3/uL (ref 0.0–0.5)
HCT: 35.6 % (ref 34.8–46.6)
HGB: 11.7 g/dL (ref 11.6–15.9)
LYMPH%: 9 % — ABNORMAL LOW (ref 14.0–49.7)
MCH: 31.5 pg (ref 25.1–34.0)
MCHC: 32.9 g/dL (ref 31.5–36.0)
MCV: 96 fL (ref 79.5–101.0)
MONO#: 1.2 10*3/uL — ABNORMAL HIGH (ref 0.1–0.9)
MONO%: 13.3 % (ref 0.0–14.0)
NEUT#: 6.9 10*3/uL — ABNORMAL HIGH (ref 1.5–6.5)
NEUT%: 76.3 % (ref 38.4–76.8)
Platelets: 168 10*3/uL (ref 145–400)
RBC: 3.71 10*6/uL (ref 3.70–5.45)
RDW: 18.5 % — ABNORMAL HIGH (ref 11.2–14.5)
WBC: 9.1 10*3/uL (ref 3.9–10.3)
lymph#: 0.8 10*3/uL — ABNORMAL LOW (ref 0.9–3.3)
nRBC: 0 % (ref 0–0)

## 2009-09-12 LAB — COMPREHENSIVE METABOLIC PANEL
ALT: 15 U/L (ref 0–35)
AST: 35 U/L (ref 0–37)
Albumin: 4 g/dL (ref 3.5–5.2)
Alkaline Phosphatase: 177 U/L — ABNORMAL HIGH (ref 39–117)
BUN: 11 mg/dL (ref 6–23)
CO2: 21 mEq/L (ref 19–32)
Calcium: 8.8 mg/dL (ref 8.4–10.5)
Chloride: 101 mEq/L (ref 96–112)
Creatinine, Ser: 0.82 mg/dL (ref 0.40–1.20)
Glucose, Bld: 109 mg/dL — ABNORMAL HIGH (ref 70–99)
Potassium: 3.6 mEq/L (ref 3.5–5.3)
Sodium: 137 mEq/L (ref 135–145)
Total Bilirubin: 0.7 mg/dL (ref 0.3–1.2)
Total Protein: 6.8 g/dL (ref 6.0–8.3)

## 2009-09-21 ENCOUNTER — Ambulatory Visit: Payer: Self-pay | Admitting: Oncology

## 2009-09-25 LAB — COMPREHENSIVE METABOLIC PANEL
ALT: 17 U/L (ref 0–35)
AST: 32 U/L (ref 0–37)
Albumin: 4.3 g/dL (ref 3.5–5.2)
Alkaline Phosphatase: 180 U/L — ABNORMAL HIGH (ref 39–117)
BUN: 14 mg/dL (ref 6–23)
CO2: 25 mEq/L (ref 19–32)
Calcium: 9.2 mg/dL (ref 8.4–10.5)
Chloride: 103 mEq/L (ref 96–112)
Creatinine, Ser: 0.85 mg/dL (ref 0.40–1.20)
Glucose, Bld: 99 mg/dL (ref 70–99)
Potassium: 3.9 mEq/L (ref 3.5–5.3)
Sodium: 139 mEq/L (ref 135–145)
Total Bilirubin: 1 mg/dL (ref 0.3–1.2)
Total Protein: 7.2 g/dL (ref 6.0–8.3)

## 2009-09-25 LAB — CBC WITH DIFFERENTIAL/PLATELET
BASO%: 0.3 % (ref 0.0–2.0)
Basophils Absolute: 0 10*3/uL (ref 0.0–0.1)
EOS%: 0.6 % (ref 0.0–7.0)
Eosinophils Absolute: 0 10*3/uL (ref 0.0–0.5)
HCT: 33.9 % — ABNORMAL LOW (ref 34.8–46.6)
HGB: 11.6 g/dL (ref 11.6–15.9)
LYMPH%: 7.4 % — ABNORMAL LOW (ref 14.0–49.7)
MCH: 33.7 pg (ref 25.1–34.0)
MCHC: 34.2 g/dL (ref 31.5–36.0)
MCV: 98.5 fL (ref 79.5–101.0)
MONO#: 0.5 10*3/uL (ref 0.1–0.9)
MONO%: 6.4 % (ref 0.0–14.0)
NEUT#: 6.8 10*3/uL — ABNORMAL HIGH (ref 1.5–6.5)
NEUT%: 85.3 % — ABNORMAL HIGH (ref 38.4–76.8)
Platelets: 189 10*3/uL (ref 145–400)
RBC: 3.44 10*6/uL — ABNORMAL LOW (ref 3.70–5.45)
RDW: 21 % — ABNORMAL HIGH (ref 11.2–14.5)
WBC: 8 10*3/uL (ref 3.9–10.3)
lymph#: 0.6 10*3/uL — ABNORMAL LOW (ref 0.9–3.3)

## 2009-10-12 HISTORY — PX: MOUTH SURGERY: SHX715

## 2009-10-18 ENCOUNTER — Ambulatory Visit (HOSPITAL_COMMUNITY): Admission: RE | Admit: 2009-10-18 | Discharge: 2009-10-18 | Payer: Self-pay | Admitting: Dentistry

## 2009-10-18 ENCOUNTER — Ambulatory Visit: Payer: Self-pay | Admitting: Dentistry

## 2009-11-26 ENCOUNTER — Ambulatory Visit: Payer: Self-pay | Admitting: Oncology

## 2009-11-27 ENCOUNTER — Encounter (INDEPENDENT_AMBULATORY_CARE_PROVIDER_SITE_OTHER): Payer: Self-pay | Admitting: *Deleted

## 2009-11-27 ENCOUNTER — Ambulatory Visit (HOSPITAL_COMMUNITY): Admission: RE | Admit: 2009-11-27 | Discharge: 2009-11-27 | Payer: Self-pay | Admitting: Oncology

## 2009-11-28 ENCOUNTER — Telehealth: Payer: Self-pay | Admitting: Internal Medicine

## 2009-11-28 LAB — COMPREHENSIVE METABOLIC PANEL
ALT: 24 U/L (ref 0–35)
AST: 34 U/L (ref 0–37)
Albumin: 4.4 g/dL (ref 3.5–5.2)
Alkaline Phosphatase: 168 U/L — ABNORMAL HIGH (ref 39–117)
BUN: 16 mg/dL (ref 6–23)
CO2: 22 mEq/L (ref 19–32)
Calcium: 9.5 mg/dL (ref 8.4–10.5)
Chloride: 107 mEq/L (ref 96–112)
Creatinine, Ser: 0.88 mg/dL (ref 0.40–1.20)
Glucose, Bld: 105 mg/dL — ABNORMAL HIGH (ref 70–99)
Potassium: 4.2 mEq/L (ref 3.5–5.3)
Sodium: 139 mEq/L (ref 135–145)
Total Bilirubin: 0.6 mg/dL (ref 0.3–1.2)
Total Protein: 6.8 g/dL (ref 6.0–8.3)

## 2009-11-28 LAB — CBC WITH DIFFERENTIAL/PLATELET
BASO%: 0.1 % (ref 0.0–2.0)
Basophils Absolute: 0 10*3/uL (ref 0.0–0.1)
EOS%: 2.6 % (ref 0.0–7.0)
Eosinophils Absolute: 0.1 10*3/uL (ref 0.0–0.5)
HCT: 37.5 % (ref 34.8–46.6)
HGB: 12.7 g/dL (ref 11.6–15.9)
LYMPH%: 12.8 % — ABNORMAL LOW (ref 14.0–49.7)
MCH: 33.1 pg (ref 25.1–34.0)
MCHC: 33.8 g/dL (ref 31.5–36.0)
MCV: 97.8 fL (ref 79.5–101.0)
MONO#: 0.3 10*3/uL (ref 0.1–0.9)
MONO%: 7.9 % (ref 0.0–14.0)
NEUT#: 3.1 10*3/uL (ref 1.5–6.5)
NEUT%: 76.6 % (ref 38.4–76.8)
Platelets: 223 10*3/uL (ref 145–400)
RBC: 3.83 10*6/uL (ref 3.70–5.45)
RDW: 14.7 % — ABNORMAL HIGH (ref 11.2–14.5)
WBC: 4 10*3/uL (ref 3.9–10.3)
lymph#: 0.5 10*3/uL — ABNORMAL LOW (ref 0.9–3.3)

## 2009-11-28 LAB — CEA: CEA: 1.7 ng/mL (ref 0.0–5.0)

## 2009-12-06 DIAGNOSIS — D649 Anemia, unspecified: Secondary | ICD-10-CM | POA: Insufficient documentation

## 2009-12-06 DIAGNOSIS — R5383 Other fatigue: Secondary | ICD-10-CM | POA: Insufficient documentation

## 2009-12-06 DIAGNOSIS — G629 Polyneuropathy, unspecified: Secondary | ICD-10-CM | POA: Insufficient documentation

## 2009-12-06 DIAGNOSIS — R197 Diarrhea, unspecified: Secondary | ICD-10-CM | POA: Insufficient documentation

## 2009-12-06 DIAGNOSIS — I10 Essential (primary) hypertension: Secondary | ICD-10-CM | POA: Insufficient documentation

## 2009-12-06 DIAGNOSIS — E1159 Type 2 diabetes mellitus with other circulatory complications: Secondary | ICD-10-CM | POA: Insufficient documentation

## 2009-12-18 ENCOUNTER — Ambulatory Visit: Payer: Self-pay | Admitting: Internal Medicine

## 2009-12-19 ENCOUNTER — Ambulatory Visit: Payer: Self-pay | Admitting: Internal Medicine

## 2009-12-19 LAB — HM COLONOSCOPY

## 2009-12-20 ENCOUNTER — Telehealth: Payer: Self-pay | Admitting: Internal Medicine

## 2009-12-21 ENCOUNTER — Encounter: Payer: Self-pay | Admitting: Internal Medicine

## 2009-12-24 ENCOUNTER — Telehealth (INDEPENDENT_AMBULATORY_CARE_PROVIDER_SITE_OTHER): Payer: Self-pay | Admitting: *Deleted

## 2010-01-01 ENCOUNTER — Ambulatory Visit: Payer: Self-pay | Admitting: Dentistry

## 2010-01-21 ENCOUNTER — Ambulatory Visit: Payer: Self-pay | Admitting: Oncology

## 2010-01-22 ENCOUNTER — Telehealth (INDEPENDENT_AMBULATORY_CARE_PROVIDER_SITE_OTHER): Payer: Self-pay | Admitting: *Deleted

## 2010-03-13 ENCOUNTER — Ambulatory Visit: Payer: Self-pay | Admitting: Oncology

## 2010-03-15 ENCOUNTER — Encounter: Payer: Self-pay | Admitting: Internal Medicine

## 2010-03-15 LAB — CBC WITH DIFFERENTIAL/PLATELET
BASO%: 0.3 % (ref 0.0–2.0)
Basophils Absolute: 0 10*3/uL (ref 0.0–0.1)
EOS%: 2.1 % (ref 0.0–7.0)
Eosinophils Absolute: 0.1 10*3/uL (ref 0.0–0.5)
HCT: 37 % (ref 34.8–46.6)
HGB: 12.8 g/dL (ref 11.6–15.9)
LYMPH%: 15.6 % (ref 14.0–49.7)
MCH: 31.9 pg (ref 25.1–34.0)
MCHC: 34.6 g/dL (ref 31.5–36.0)
MCV: 92.1 fL (ref 79.5–101.0)
MONO#: 0.6 10*3/uL (ref 0.1–0.9)
MONO%: 9.5 % (ref 0.0–14.0)
NEUT#: 4.5 10*3/uL (ref 1.5–6.5)
NEUT%: 72.5 % (ref 38.4–76.8)
Platelets: 261 10*3/uL (ref 145–400)
RBC: 4.01 10*6/uL (ref 3.70–5.45)
RDW: 15.5 % — ABNORMAL HIGH (ref 11.2–14.5)
WBC: 6.2 10*3/uL (ref 3.9–10.3)
lymph#: 1 10*3/uL (ref 0.9–3.3)

## 2010-03-15 LAB — CEA: CEA: 1 ng/mL (ref 0.0–5.0)

## 2010-03-15 LAB — COMPREHENSIVE METABOLIC PANEL
ALT: 27 U/L (ref 0–35)
AST: 33 U/L (ref 0–37)
Albumin: 4.3 g/dL (ref 3.5–5.2)
Alkaline Phosphatase: 176 U/L — ABNORMAL HIGH (ref 39–117)
BUN: 13 mg/dL (ref 6–23)
CO2: 23 mEq/L (ref 19–32)
Calcium: 9.2 mg/dL (ref 8.4–10.5)
Chloride: 106 mEq/L (ref 96–112)
Creatinine, Ser: 0.96 mg/dL (ref 0.40–1.20)
Glucose, Bld: 91 mg/dL (ref 70–99)
Potassium: 4.4 mEq/L (ref 3.5–5.3)
Sodium: 139 mEq/L (ref 135–145)
Total Bilirubin: 0.4 mg/dL (ref 0.3–1.2)
Total Protein: 6.8 g/dL (ref 6.0–8.3)

## 2010-03-18 ENCOUNTER — Ambulatory Visit (HOSPITAL_COMMUNITY)
Admission: RE | Admit: 2010-03-18 | Discharge: 2010-03-18 | Payer: Self-pay | Source: Home / Self Care | Admitting: Oncology

## 2010-03-21 ENCOUNTER — Telehealth: Payer: Self-pay | Admitting: Internal Medicine

## 2010-03-21 ENCOUNTER — Encounter: Payer: Self-pay | Admitting: Internal Medicine

## 2010-04-16 ENCOUNTER — Ambulatory Visit
Admission: RE | Admit: 2010-04-16 | Discharge: 2010-04-16 | Payer: Self-pay | Source: Home / Self Care | Attending: Internal Medicine | Admitting: Internal Medicine

## 2010-04-16 DIAGNOSIS — F17200 Nicotine dependence, unspecified, uncomplicated: Secondary | ICD-10-CM

## 2010-04-16 DIAGNOSIS — J309 Allergic rhinitis, unspecified: Secondary | ICD-10-CM | POA: Insufficient documentation

## 2010-04-16 DIAGNOSIS — F17210 Nicotine dependence, cigarettes, uncomplicated: Secondary | ICD-10-CM | POA: Insufficient documentation

## 2010-04-16 DIAGNOSIS — Z720 Tobacco use: Secondary | ICD-10-CM | POA: Insufficient documentation

## 2010-05-01 ENCOUNTER — Ambulatory Visit
Admission: RE | Admit: 2010-05-01 | Discharge: 2010-05-01 | Payer: Self-pay | Source: Home / Self Care | Attending: Internal Medicine | Admitting: Internal Medicine

## 2010-05-06 ENCOUNTER — Ambulatory Visit: Payer: Self-pay | Admitting: Oncology

## 2010-05-14 NOTE — Discharge Summary (Signed)
Summary: Rectal Cancer  NAME:  [name], [name]                ACCOUNT NO.:  [id number]      MEDICAL RECORD NO.:  [id number]           PATIENT TYPE:      LOCATION:                                 FACILITY:      PHYSICIAN:  Jordan Hawks. Elnoria Howard, MD    DATE OF BIRTH:  01-17-1956      DATE OF ADMISSION:   DATE OF DISCHARGE:                                  DISCHARGE SUMMARY      DISCHARGE DIAGNOSES:   1. Rectal adenocarcinoma.   2. Severe iron deficiency secondary to cancer.   3. Rectal bleeding secondary to the rectal cancer.      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      HOSPITAL COURSE:  Please see the original H and P for full details.  The   patient was admitted to the hospital and noted to have a markedly low   hemoglobin in 5 range.  She is subsequently transfused with 4 units of   packed red blood cells and she felt much better afterwards.  She   underwent a colonoscopy during her second hospital stay and she was   noted to have a large rectal mass that was 50% in the conference and   measuring from 7 cm to 11-12 cm from the anal verge.  No other masses or   polyps were identified during the colonoscopy.  Subsequently, she   underwent a rectal endoscopic ultrasound.  She was found to have a T3,   N0 rectal mass.  The patient did respond well with the blood   transfusions and because of her good response it was felt that she could   be followed up and treated on an outpatient basis.  The patient had the   option of staying in the hospital for further treatment, but she decided   she wanted to go home.      PLAN:  Plan at this time is to arrange for surgical and oncologic   evaluation and repeat colonoscopy in 1 year.               Jordan Hawks Elnoria Howard, MD   Electronically Signed            PDH/MEDQ  D:  10/27/2008  T:  10/28/2008  Job:  [id number]

## 2010-05-14 NOTE — Discharge Summary (Signed)
Summary: Rectal Cancer  NAME:  [name], [name]                ACCOUNT NO.:  [id number]      MEDICAL RECORD NO.:  [id number]          PATIENT TYPE:  INP      LOCATION:  5151                         FACILITY:  [facility]      PHYSICIAN:  Cherylynn Ridges, M.D.    DATE OF BIRTH:  June 29, 1955      DATE OF ADMISSION:  03/07/2009   DATE OF DISCHARGE:  03/12/2009                                  DISCHARGE SUMMARY      DISCHARGE DIAGNOSIS:  Rectal cancer status post neoadjuvant x-ray   therapy and chemotherapy.      PRINCIPAL PROCEDURE:  Low anterior resection performed by Dr. Lindie Spruce on   day of admission.      DISCHARGE MEDICATIONS:  Compazine and Tylenol that the patient was   taking preoperatively and Percocet p.r.n. for pain.      She is follow up to see me in 1 week at which time we will have staple   removal.      DIET ON DISCHARGE:  Soft.      CONDITION:  Stable.      She will be given a prescription for discharge medication.  She will   return to see me in 1 week.      BRIEF SUMMARY OF HOSPITAL COURSE:  The patient was admitted on day of   surgery after a course of neoadjuvant radiation and chemotherapy for low   anterior resection performed for rectal cancer proven by biopsy.   Surgery went well.  She had a low anterior anastomosis without diverting   ileostomy.  First 2 and 3 postop day, she had a postoperative ileus.   She did not receive her preoperative dose of Entereg or alvimopan,   however, did start receiving postop.  By postop day #3, she began to   pass flatus, had a bowel movement on postop day #4, was advanced to a   soft diet.      She has been ambulatory with no evidence of DVT.  Hemoglobin postop was   within normal limits.      Her incision was well healed with no evidence of infection.  She can   shower and pat her wound dry.  She will take Percocet for pain.  Her   Port-A-Cath which was used for access in the hospital was decannulated.   She will go home with that  and use it as needed as an outpatient.  She   will return to see me in 1 week.  She is doing well and otherwise she   has no problems.  Pathology is pending at the time of discharge.               Cherylynn Ridges, M.D.   Electronically Signed            JOW/MEDQ  D:  03/12/2009  T:  03/13/2009  Job:  [id number]

## 2010-05-14 NOTE — Miscellaneous (Signed)
Summary: rx for proctocort  Clinical Lists Changes  Medications: Added new medication of PROCTOCORT 30 MG SUPP (HYDROCORTISONE ACETATE) insert rectally at bedtime - Signed Rx of PROCTOCORT 30 MG SUPP (HYDROCORTISONE ACETATE) insert rectally at bedtime;  #30 x 1;  Signed;  Entered by: Oda Cogan RN;  Authorized by: Hart Carwin MD;  Method used: Electronically to [facility] [phone]*, [address], [place], [place]  [address], Ph: [phone], Fax: [phone]    Prescriptions: PROCTOCORT 30 MG SUPP (HYDROCORTISONE ACETATE) insert rectally at bedtime  #30 x 1   Entered by:   Oda Cogan RN   Authorized by:   Hart Carwin MD   Signed by:   Oda Cogan RN on 12/19/2009   Method used:   Electronically to        [facility] [phone]* (retail)       [address]       [place], [place]  [address]       Ph: [phone]       Fax: [phone]   RxID:   [id number]

## 2010-05-14 NOTE — Progress Notes (Signed)
  Phone Note Other Incoming   Request: Send information Summary of Call: Request for records received from [facility]. Request forwarded to [employer].      Appended Document:  Request for records received from [facility]. Request forwarded to [employer].

## 2010-05-14 NOTE — Progress Notes (Signed)
Summary: Sending Records for Review/ FYI  Phone Note From Other Clinic   Caller: [facility] @ Dr Dante Gang [phone] Call For: Dr Juanda Chance ( Doc of the Day ) Reason for Call: Schedule Patient Appt Summary of Call: Wants to schedule appt within 1 month for HX of rectal bleeding. Hx with Dr Elnoria Howard Nov 2010. Had a Colon. Dr Elnoria Howard refuses to see her anymore because her insurance [employer] maxed out. I asked Meliisa to fax records over for review and we would call her back with appt for patient. Initial call taken by: Leanor Kail [facility],  November 28, 2009 10:05 AM  Follow-up for Phone Call        noted. Follow-up by: Lamona Curl CMA Duncan Dull),  November 28, 2009 11:05 AM

## 2010-05-14 NOTE — Procedures (Signed)
Summary: Colonoscopy  Patient: [name] Note: All result statuses are Final unless otherwise noted.  Tests: (1) Colonoscopy (COL)   COL Colonoscopy           DONE     [facility]     [address]     [place], [place]  [address]           COLONOSCOPY PROCEDURE REPORT           PATIENT:  [name], [name]  MR#:  [id number]     BIRTHDATE:  [birth year], 54 yrs. old  GENDER:  female     ENDOSCOPIST:  Hedwig Morton. Juanda Chance, MD     REF. 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  Digital rectal exam was performed and     revealed decreased sphincter tone.   The LB PCF-H180AL [id number]     endoscope was introduced through the anus and advanced to the     cecum, which was identified by both the appendix and ileocecal     valve, without limitations.  The quality of the prep was good,     using MiraLax.  The instrument was then slowly withdrawn as the     colon was fully examined.     <<PROCEDUREIMAGES>>           FINDINGS:  Colitis was found in the rectum and sigmoid colon.     edematous mucosa 0-10 cm, loss of submucosal blood vessels, no     friability, retained suture at 5 cm, macerated mucosa at the     dentate line With standard forceps, biopsy was obtained and sent     to pathology (see image1, image2, image13, image12, image11, and     image10).  Two polyps were found. at 60 cm 4 and 5 mm sessile     polyps Polyps were snared without cautery. Retrieval was     successful (see image9 and image8). snare polyp  Moderate     diverticulosis was found throughout the colon.   Retroflexed views     in the  rectum revealed no abnormalities.    The scope was then     withdrawn from the patient and the procedure completed.           COMPLICATIONS:  None     ENDOSCOPIC IMPRESSION:     1) Colitis in the rectum and sigmoid colon     2) Two polyps     3) Moderate diverticulosis throughout the colon     mild radiation proctitis, decreased rectal sphincter, no     evidence of recurrent cancer/ ss/p biopsies     RECOMMENDATIONS:     1) Await pathology results     Proctocort HC supp 25 mg q hs, # 30, 1 refill     REPEAT EXAM:  In 2 year(s) for.           ______________________________  Hedwig Morton. Juanda Chance, MD           CC:           n.     eSIGNED:   Hedwig Morton. Brodie at 12/19/2009 02:52 PM           Lenna Sciara, [id number]  Note: An exclamation mark (!) indicates a result that was not dispersed into the flowsheet. Document Creation Date: 12/19/2009 2:53 PM _______________________________________________________________________  (1) Order result status: Final Collection or observation date-time: 12/19/2009 14:39 Requested date-time:  Receipt date-time:  Reported date-time:  Referring Physician:   Ordering Physician: [name] [phone]) Specimen Source:  Source: Launa Grill Order Number: [id number] Lab site:   Appended Document: Colonoscopy CC: Dr Jethro Bolus

## 2010-05-14 NOTE — Letter (Signed)
Summary: Out of Work  [facility]  [address] [place], [place] [address]   Phone: [phone]  Fax: [phone]    March 21, 2010   Employee:  [name]    To Whom It May Concern:   For Medical reasons, please excuse the above named employee from work for the following dates:  Start:03/21/2010   May return to work on 03/25/2010   If you need additional information, please feel free to contact our office.         Sincerely,    [name] Fall RN

## 2010-05-14 NOTE — Progress Notes (Signed)
Summary: med ?  Phone Note From Pharmacy Call back at [phone]   Caller: Cala Bradford, pharmacist Call For: Dr. Juanda Chance  Summary of Call: would like to know if the generic form, Hemril, of Proctocort suppositories is ok Initial call taken by: Vallarie Mare,  December 20, 2009 9:44 AM  Follow-up for Phone Call        Advised pharmacist, Kim that Hemril is okay to use in substitution of proctocort suppositories as long as it is a hydrocortisone suppository. Follow-up by: Lamona Curl CMA Duncan Dull),  December 20, 2009 1:23 PM

## 2010-05-14 NOTE — Procedures (Signed)
Summary: Colonoscopy  Patient: [name] Note: All result statuses are Final unless otherwise noted.  Tests: (1) Colonoscopy (COL)   COL Colonoscopy           DONE (C)     [facility]     [address]     [place], [place]  [address]           COLONOSCOPY PROCEDURE REPORT           PATIENT:  [name], [name]  MR#:  [id number]     BIRTHDATE:  1956-03-29, 54 yrs. old  GENDER:  female     ENDOSCOPIST:  Hedwig Morton. Juanda Chance, MD     REF. 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  Digital rectal exam was performed and     revealed decreased sphincter tone.   The LB PCF-H180AL [id number]     endoscope was introduced through the anus and advanced to the     cecum, which was identified by both the appendix and ileocecal     valve, without limitations.  The quality of the prep was good,     using MiraLax.  The instrument was then slowly withdrawn as the     colon was fully examined.     <<PROCEDUREIMAGES>>           FINDINGS:  Colitis was found in the rectum and sigmoid colon.     edematous mucosa 0-10 cm, loss of submucosal blood vessels, no     friability, retained suture at 5 cm, macerated mucosa at the     dentate line With standard forceps, biopsy was obtained and sent     to pathology (see image1, image2, image13, image12, image11, and     image10).  Two polyps were found. at 60 cm 4 and 5 mm sessile     polyps Polyps were snared without cautery. Retrieval was     successful (see image9 and image8). snare polyp  Moderate     diverticulosis was found throughout the colon.   Retroflexed views     in  the rectum revealed no abnormalities.    The scope was then     withdrawn from the patient and the procedure completed.           COMPLICATIONS:  None     ENDOSCOPIC IMPRESSION:     1) Colitis in the rectum and sigmoid colon     2) Two polyps     3) Moderate diverticulosis throughout the colon     mild radiation proctitis, decreased rectal sphincter, no     evidence of recurrent cancer/ ss/p biopsies     RECOMMENDATIONS:     1) Await pathology results     Proctocort HC supp 25 mg q hs, # 30, 1 refill     REPEAT EXAM:  In 2 year(s) for.           ______________________________  Hedwig Morton. Juanda Chance, MD           CC:           n.     REVISED:  12/21/2009 09:01 AM     eSIGNED:   Hedwig Morton. Brodie at 12/21/2009 09:01 AM           Lenna Sciara, [id number]  Note: An exclamation mark (!) indicates a result that was not dispersed into the flowsheet. Document Creation Date: 12/21/2009 9:02 AM _______________________________________________________________________  (1) Order result status: Final Collection or observation date-time: 12/19/2009 14:39 Requested date-time:  Receipt date-time:  Reported date-time:  Referring Physician:   Ordering Physician: [name] [phone]) Specimen Source:  Source: Launa Grill Order Number: [id number] Lab site:   Appended Document: Colonoscopy     Procedures Next Due Date:    Colonoscopy: 12/2011

## 2010-05-14 NOTE — Letter (Signed)
Summary: Patient Notice- Polyp Results  [facility]  [address] [place], [place] [address]   Phone: [phone]  Fax: [phone]        December 21, 2009 MRN: [id number]    [facility] [address] [place], [place]  [address]    Dear Ms. GRAHAM,  I am pleased to inform you that the colon polyp(s) removed during your recent colonoscopy was (were) found to be benign (no cancer detected) upon pathologic examination.The polyp was adenomatous ( precancerous)  I recommend you have a repeat colonoscopy examination in 2_ years to look for recurrent polyps, as having colon polyps increases your risk for having recurrent polyps or even colon cancer in the future.  Should you develop new or worsening symptoms of abdominal pain, bowel habit changes or bleeding from the rectum or bowels, please schedule an evaluation with either your primary care physician or with me.  Additional information/recommendations:  _x_ No further action with gastroenterology is needed at this time. Please      follow-up with your primary care physician for your other healthcare      needs.  __ Please call [phone] to schedule a return visit to review your      situation.  __ Please keep your follow-up visit as already scheduled.  __ Continue treatment plan as outlined the day of your exam.  Please call us if you are having persistent problems or have questions about your condition that have not been fully answered at this time.  Sincerely,  Hart Carwin MD  This letter has been electronically signed by your physician.  Appended Document: Patient Notice- Polyp Results letter mailed.

## 2010-05-14 NOTE — Progress Notes (Signed)
  Phone Note Other Incoming   Request: Send information Summary of Call: Request for records received from [employer]. Request forwarded to [employer].     

## 2010-05-14 NOTE — Assessment & Plan Note (Signed)
Summary: 1 month rectal bleeding, hx colon ca/dn   History of Present Illness Visit Type: consult  Primary GI MD: [name] MD Primary Provider: na Requesting Provider: Jethro Bolus, MD Chief Complaint: One month of rectal bleeding, bloating, rectal pain, nausea, constipation, diarrhea, fecal incontinence, and hemorrhoids  History of Present Illness:   This is a 55 year old African American female with Hemoccult-positive stool and bright red blood per rectum before each bowel movement. She has diarrhea and constipation. She was diagnosed with adenocarcinoma of the rectum in July 2010 when she presented to the hospital with a hemoglobin of 5.4. She underwent preoperative chemotherapy and radiation and subsequently a lower anterior resection in November 2010. She did have a diverting loop ileostomy. A recent CT scan of the abdomen in August 2011 showed no active disease with prominent periaortic lymph nodes. She has a positive family history of colon cancer and a positive family history of gastric cancer in her sister. She has had rectal soreness from the radiation and lower abdominal discomfort. She lost about 30 pounds since the surgery.   GI Review of Systems    Reports bloating and  nausea.      Denies abdominal pain, acid reflux, belching, chest pain, dysphagia with liquids, dysphagia with solids, heartburn, loss of appetite, vomiting, vomiting blood, weight loss, and  weight gain.      Reports constipation, diarrhea, fecal incontinence, hemorrhoids, rectal bleeding, and  rectal pain.     Denies anal fissure, black tarry stools, change in bowel habit, diverticulosis, heme positive stool, irritable bowel syndrome, jaundice, light color stool, and  liver problems.    Current Medications (verified): 1)  Hydrochlorothiazide 25 Mg Tabs (Hydrochlorothiazide) .... Take 1 Tablet By Mouth Once A Day 2)  Ferrous Sulfate 325 (65 Fe) Mg Tbec (Ferrous Sulfate) .... Take 1 Tablet By Mouth Once A Day 3)   Percocet (Unknown Dosage) .... Take 1 Tablet By Mouth Every 8 Hours As Needed Forpain 4)  Prochlorperazine Maleate 10 Mg Tabs (Prochlorperazine Maleate) .... Take 1 Tablet By Mouth Every 6 Hours As Needed For Nausea and Vomtiing 5)  Vitamin B6 (Unknown Dosage) Tablet .... Take 1 Tablet By Mouth Every Day 6)  Lyrica 50 Mg Caps (Pregabalin) .... One Every Eight Hours As Needed 7)  Anti-Diarrheal 2 Mg Tabs (Loperamide Hcl) .... As Needed 8)  Laxative 25 Mg Tabs (Sennosides) .... As Needed  Allergies (verified): No Known Drug Allergies  Past History:  Past Medical History: Reviewed history from 12/06/2009 and no changes required. Current Problems:  Family Hx of COLON CANCER (ICD-153.9) NEUROPATHY (ICD-355.9) HYPERTENSION (ICD-401.9) FATIGUE (ICD-780.79) DIARRHEA (ICD-787.91) CONSTIPATION (ICD-564.00) Hx of ADENOCARCINOMA, RECTUM (ICD-154.1) GASTROINTESTINAL HEMORRHAGE, HX OF (ICD-V12.79) UNSPECIFIED ANEMIA (ICD-285.9)    Past Surgical History: Colon Resection Dental Surgery  Family History: Family History of Colon Cancer: Mother Family History of Stomach Cancer: Sister Family History of Colon Polyps:Sister  Family History of Diabetes: Father   Social History: Personnel officer at [employer] Patient currently smokes.  Alcohol Use - yes 1-3 drinks per night Illicit Drug Use - yes-cocaine--stopped 15 yrs ago per patient  Daily Caffeine Use: one daily   Review of Systems       The patient complains of fatigue and muscle pains/cramps.  The patient denies allergy/sinus, anemia, anxiety-new, arthritis/joint pain, back pain, blood in urine, breast changes/lumps, change in vision, confusion, cough, coughing up blood, depression-new, fainting, fever, headaches-new, hearing problems, heart murmur, heart rhythm changes, itching, menstrual pain, night sweats, nosebleeds,  pregnancy symptoms, shortness of breath, skin rash, sleeping problems, sore throat, swelling of feet/legs, swollen lymph  glands, thirst - excessive , urination - excessive , urination changes/pain, urine leakage, vision changes, and voice change.         Pertinent positive and negative review of systems were noted in the above HPI. All other ROS was otherwise negative.   Vital Signs:  Patient profile:   55 year old female Height:      66 inches Weight:      152 pounds BMI:     24.62 BSA:     1.78 Pulse rate:   68 / minute Pulse rhythm:   regular BP sitting:   126 / 80  (left arm) Cuff size:   regular  Vitals Entered By: Ok Anis CMA (December 18, 2009 10:18 AM)  Physical Exam  General:  edentulous, alert, oriented and no distress. Eyes:  nonicteric. Mouth:  normal oral mucosa. Neck:  jugular veins not distended. Lungs:  Clear throughout to auscultation. Heart:  Regular rate and rhythm; no murmurs, rubs,  or bruits. Abdomen:  soft abdomen with the well-healed surgical scar. Normoactive bowel sounds. No significant tenderness. Liver edge at costal margin. Rectal:  normal perianal area. Marked tenderness on digital exam of the anal canal and rectal ampulla consistent with radiation changes. Stool is trace Hemoccult positive. Extremities:  no edema. Neurologic:  neuropathy of both feet. Skin:  tattoo marks for radiation landmarks below the umbilicus. Psych:  Alert and cooperative. Normal mood and affect.   Impression & Recommendations:  Problem # 1:  Hx of ADENOCARCINOMA, RECTUM (ICD-154.1)  It has now been one year since the initial diagnosis. She is due for a repeat colonoscopy. We will schedule a colonoscopy with MiraLax prep. I discussed it with the patient.  Orders: Colonoscopy (Colon)  Problem # 2:  DIARRHEA (ICD-787.91) Patient has alternating bowel habits. We need to rule out radiation proctitis.  Problem # 3:  GASTROINTESTINAL HEMORRHAGE, HX OF (ICD-V12.79) Patient has heme positive stool. We will proceed with a colonoscopy to assess for recurrent cancer versus radiation  proctitis.  Patient Instructions: 1)  colonoscopy with biopsies using MiraLax prep. 2)  Depending on the results, patient may need specific treatment for proctitis. 3)  Copy sent to : Dr Gaylyn Rong 4)  The medication list was reviewed and reconciled.  All changed / newly prescribed medications were explained.  A complete medication list was provided to the patient / caregiver. Prescriptions: DULCOLAX 5 MG  TBEC (BISACODYL) Day before procedure take 2 at 3pm and 2 at 8pm.  #4 x 0   Entered by:   Lamona Curl CMA (AAMA)   Authorized by:   Hart Carwin MD   Signed by:   Lamona Curl CMA (AAMA) on 12/18/2009   Method used:   Electronically to        [facility] [phone]* (retail)       [address]       [place], [place]  [address]       Ph: [phone]       Fax: [phone]   RxID:   [id number] REGLAN 10 MG  TABS (METOCLOPRAMIDE HCL) As per prep instructions.  #2 x 0   Entered by:   Lamona Curl CMA (AAMA)   Authorized by:   Hart Carwin MD   Signed by:   Lamona Curl CMA (AAMA) on 12/18/2009   Method used:   Electronically to  [facility] [address] [phone]* (retail)       [address]       [place], [place]  [address]       Ph: [phone]       Fax: [phone]   RxID:   [id number] MIRALAX   POWD (POLYETHYLENE GLYCOL 3350) As per prep  instructions.  #255gm x 0   Entered by:   Lamona Curl CMA (AAMA)   Authorized by:   Hart Carwin MD   Signed by:   Lamona Curl CMA (AAMA) on 12/18/2009   Method used:   Electronically to        [facility] [phone] (retail)       [address]       [place], [place]  [address]       Ph: [phone]       Fax: [phone]   RxID:   [id number]

## 2010-05-14 NOTE — Letter (Signed)
Summary: [facility] Instructions  [facility]  [address] [place], [place] [address]   Phone: [phone]  Fax: [phone]       [name]    April 21, 1955    MRN: [id number]       Procedure Day /Date: 12/19/09 Tuesday     Arrival Time: 1:30 pm     Procedure Time: 2:30 pm     Location of Procedure:                    _x _  [facility] (4th Floor)  PREPARATION FOR COLONOSCOPY WITH MIRALAX    THE DAY BEFORE YOUR PROCEDURE         DATE: 12/18/09 DAY: Monday  1   Drink clear liquids the entire day-NO SOLID FOOD  2   Do not drink anything colored red or purple.  Avoid juices with pulp.  No orange juice.  3   Drink at least 64 oz. (8 glasses) of fluid/clear liquids during the day to prevent dehydration and help the prep work efficiently.  CLEAR LIQUIDS INCLUDE: Water Jello Ice Popsicles Tea (sugar ok, no milk/cream) Powdered fruit flavored drinks Coffee (sugar ok, no milk/cream) Gatorade Juice: apple, white grape, white cranberry  Lemonade Clear bullion, consomm, broth Carbonated beverages (any kind) Strained chicken noodle soup Hard Candy  4   Mix the entire bottle of Miralax with 64 oz. of Gatorade/Powerade in the morning and put in the refrigerator to chill.  5   At 3:00 pm take 2 Dulcolax/Bisacodyl tablets.  6   At 4:30 pm take one Reglan/Metoclopramide tablet.  7  Starting at 5:00 pm drink one 8 oz glass of the Miralax mixture every 15-20 minutes until you have finished drinking the entire 64 oz.  You should finish drinking prep around 7:30 or 8:00 pm.  8   If you are nauseated, you may take the 2nd Reglan/Metoclopramide tablet at 6:30 pm.        9    At 8:00 pm take 2 more DULCOLAX/Bisacodyl tablets.            THE DAY OF YOUR PROCEDURE      DATE:  12/19/09 DAY: Tuesday  You may drink clear liquids until 12:30 pm  (2 HOURS BEFORE PROCEDURE).   MEDICATION INSTRUCTIONS  Unless otherwise instructed, you should take regular  prescription medications with a small sip of water as early as possible the morning of your procedure.         OTHER INSTRUCTIONS  You will need a responsible adult at least 54 years of age to accompany you and drive you home.   This person must remain in the waiting room during your procedure.  Wear loose fitting clothing that is easily removed.  Leave jewelry and other valuables at home.  However, you may wish to bring a book to read or an iPod/MP3 player to listen to music as you wait for your procedure to start.  Remove all body piercing jewelry and leave at home.  Total time from sign-in until discharge is approximately 2-3 hours.  You should go home directly after your procedure and rest.  You can resume normal activities the day after your procedure.  The day of your procedure you should not:   Drive   Make legal decisions   Operate machinery   Drink alcohol   Return to work  You will receive specific instructions about eating, activities and medications before you leave.  The above instructions have been reviewed and explained to me by   Lamona Curl CMA Duncan Dull)  December 18, 2009 10:56 AM    I fully understand and can verbalize these instructions _____________________________ Date 12/18/09

## 2010-05-14 NOTE — Op Note (Signed)
Summary: Lower Anterior Resection   NAME:  [name], [name]                ACCOUNT NO.:  [id number]      MEDICAL RECORD NO.:  [id number]          PATIENT TYPE:  INP      LOCATION:  5151                         FACILITY:  [facility]      PHYSICIAN:  Cherylynn Ridges, M.D.    DATE OF BIRTH:  November 18, 1955      DATE OF PROCEDURE:  03/07/2009   DATE OF DISCHARGE:                                  OPERATIVE REPORT      PREOPERATIVE DIAGNOSIS:  Rectal cancer treated with neoadjuvant chemo   and radiation therapy.      POSTOPERATIVE DIAGNOSIS:  Rectal cancer treated with neoadjuvant chemo   and radiation therapy.      PROCEDURES:   1. Low anterior resection.   2. Rigid sigmoidoscopy.      SURGEON:  Marta Lamas. Lindie Spruce, MD      ASSISTANTS:   1. Thomas A. Cornett, MD   2. Almond Lint, MD      ANESTHESIA:  General endotracheal.      ESTIMATED BLOOD LOSS:  150 mL.      COMPLICATIONS:  None.      CONDITION:  Stable.      FINDINGS:  The patient had only what appeared to be residual scar tissue   at the area of her previous tumor that been treated with x-ray therapy   and chemotherapy.  Rigid sigmoidoscopy preoperatively showed this tissue   to be the only area of evidence of disease of post neoadjuvant therapy.      CONDITION:  Stable.      SPECIMEN:  Rectosigmoid colon.      INDICATIONS FOR OPERATION:  The patient is a 55 year old female who had   undergone a colonoscopy for bleeding and anemia requiring 4 units of   blood prior to getting chemo and radiation therapy for rectal cancer   that was about 7-10 cm from the anal verge.  She now comes in for post-   adjuvant treatment colectomy.      OPERATION IN DETAIL:  The patient was taken to the operating room and   placed on table in supine position.  After an adequate general   endotracheal anesthetic was administered, she was placed in lithotomy   and a rigid sigmoidoscopy was done before she was prepped for the other   part of the procedure.       Using a rigid sigmoidoscope, we were able to insert it through the anus   with minimal difficulty.  As we got up to about 7-8 cm from the anal   verge, we could see a tightening in the area what appeared to be some   scar tissue and stricturing likely at the site where she had had a   previous tumor.  No bulky tumor was noted in that area but it was   friable and we were able to move beyond that to approximately 21 cm   where there appeared to be normal colon.  We retracted the scope and   irrigated with Betadine  on the way out and then subsequently placed the   patient out of steep lithotomy position in order to do the abdominal   portion of her surgery.      We subsequently prepped and draped her in lithotomy position and also   the abdomen in the usual sterile manner.      A midline incision was made from just left of the umbilicus down to the   pubic crest.  We took it down to and through the midline fascia using   electrocautery.  When we were in the peritoneal cavity, we took it   distally down to the peritoneal reflection just above the bladder and   took the fascia proximally parallel to the umbilicus.      A Balfour retractor was used to obtain adequate retraction in the lower   pelvis as we mobilized the sigmoid colon for adequate anastomosis and   subsequent anastomosis.  With a Balfour retractor in place, we placed   the patient in Trendelenburg position.  Lap tapes were used to secure   the small bowel out of the field.  We came across the mid-to-distal   sigmoid colon with a GIA-75 stapler and then subsequently to the   mesentery distally using the LigaSure device.  We mobilized the colon at   the line of Toldt and subsequently identified the left ureter.  The   right ureter was well away from my area of resection and was not   identified or even sought.      The proximal colon which had been transected was placed back up in the   upper abdomen and packed it up  with lap tapes.  We subsequently came   down the mesocolon with LigaSure device and once we got into an adequate   plane along the anterior portion of the sacral, we had to bluntly   dissect down in order to lift up and go posterior and distal to the   tumor.  The superior, middle, and the inferior hemorrhoidal vessels were   taken with a LigaSure device primarily and also electrocautery was used.      Anteriorly at the peritoneal reflection, the patient still had uterus in   place.  We were able to get into the peritoneum posterior to the uterus   and the vaginal cuff.  Allis clamps were used to the lift up the vaginal   cuff.  We dissected between that and the anterior rectal wall without   injury to either of the structures that we could tell.  A lot of   dissection was done sharply and bluntly with electrocautery, however, we   were able to get circumferentially down the rectum in that area to where   we were below the area what we thought was palpable tumor.  It was just   bulky and mesenteric by mesocolon mass.      As we got distal to the area of what we thought to be tumor and staying   away from the vagina and the cervix, we were able to circumferentially   come around the distal rectosigmoid colon.  In this part of the rectum,   we used a Contour blue stapler to come across the rectum transecting and   allowing it to retract just above where the vaginal cuff was.      We removed the specimen out of field and opened it up and we could see   that the scar from  previous tumor where the regress was about 1-2 cm   from the distal margin.  An additional centimeter margin was sent with   the ring from the EEA stapler.      The assistant surgeon at that point changed to Dr. Donell Beers who was able   to go down below and do the anastomosis after we had adequately sized   the proximal colon to be a 33-mm size and placed an anvil in the   proximal colon.  With the assistant surgeon between  the patient's legs   using dilatation and get into the anus and rectum, we were able to pass   33-mm green Endo Premium EEA, just in the middle portion of the staple   line.  As we were doing so, we lifted up the vaginal cuff with Allis   clamps making sure not to get it caught in the anastomosis.  We were   able to secure the anvil in the proximal colon using a pursestring   suture placed with a pursestring device and Keith needle with a 3-0   Prolene suture.  We subsequently passed the anvil onto the protruding   tip of the premium EEA locking and then closing it down making sure not   to entrap the vaginal cuff in the process.  We did so easily and then   fired the Premium EEA and subsequent donuts were 100% intact.      Once the Premium EEA was removed and the distal margin was sent for   specimen, we tested the anastomosis for leakage.  The pelvis was filled   with saline solution.  A spring clamp was placed on the mid sigmoid   colon.  We insufflated the area with gas and there was no evidence of   bulging or leaking.      A decision was made not to perform a diverting loop ileostomy.  We   aspirated all fluid and gas.  We irrigated further after the surgeons   changed gloves and gowns.      After we had irrigated adequately, we closed the abdomen using a running   looped #1 PDS suture.  Stainless steel staples were used on the skin.   All counts were correct.  There was excellent hemostasis.               Cherylynn Ridges, M.D.   Electronically Signed            JOW/MEDQ  D:  03/07/2009  T:  03/08/2009  Job:  [id number]      cc:   Jordan Hawks. Elnoria Howard, MD   Jethro Bolus, MD

## 2010-05-14 NOTE — Progress Notes (Signed)
Summary: Excuse for work  Phone Note Call from Patient Call back at [phone]   Caller: Patient Call For: Dr. Juanda Chance Reason for Call: Talk to Nurse Summary of Call: Having a flareup with her Colitis today and wants to know if she can get a note for work Initial call taken by: Karna Christmas,  March 21, 2010 10:52 AM  Follow-up for Phone Call        Patient called requesting a note to be out of work today because she has a " colitis flare up". Patient states she has had diarrhea since last week. She states she has gone 10-15 times this AM. She states she is taking antitdiarrheal meds. and this sometimes helps. She states she only has bleeding when she wipes due to "being irritated down there." States she has a lot of gas and some stomach cramping. Offered patient an appointment to be seen but she declines to come. She states she only wants to note to be out of work today. Please, advise. Follow-up by: [name] Fall RN,  March 21, 2010 11:16 AM  Additional Follow-up for Phone Call Additional follow up Details #1::        OK to write her an excuse. Additional Follow-up by: Hart Carwin MD,  March 21, 2010 1:04 PM    Additional Follow-up for Phone Call Additional follow up Details #2::    Note written. Message left for patient to call back.[name] Fall RN  March 21, 2010 1:24 PM Spoke with patient she will pick up the letter tomorrow AM. Follow-up by: [name] Fall RN,  March 21, 2010 3:30 PM

## 2010-05-14 NOTE — Procedures (Signed)
Summary: Dortha Kern MD  [facility] Elnoria Howard MD   Imported By: Lester Grapeville 12/27/2009 11:18:57  _____________________________________________________________________  External Attachment:    Type:   Image     Comment:   External Document

## 2010-05-16 NOTE — Letter (Signed)
Summary: [facility]  [facility]   Imported By: Sherian Rein 03/29/2010 11:11:00  _____________________________________________________________________  External Attachment:    Type:   Image     Comment:   External Document

## 2010-05-16 NOTE — Assessment & Plan Note (Signed)
Summary: NEW [employer] PT--#-PER MELISSA/DR HA HUAN/[facility]   Vital Signs:  Patient profile:   55 year old female Height:      66.5 inches (168.91 cm) Weight:      151.0 pounds (68.64 kg) O2 Sat:      99 % on Room air Temp:     98.4 degrees F (36.89 degrees C) oral Pulse rate:   61 / minute BP sitting:   142 / 88  (left arm) Cuff size:   regular  Vitals Entered By: Orlan Leavens RMA (April 16, 2010 8:18 AM)  O2 Flow:  Room air CC: New patient Is Patient Diabetic? No Pain Assessment Patient in pain? no        Primary Care Provider:  Newt Lukes MD  CC:  New patient.  History of Present Illness: new pt to me and our division, known to GI - here to est care  1) colon ca dx 10/2008 - s/p low ant resection 02/2009 and chemo thru 09/26/09 - f/u colo has been unremarkable - residual constip alt with severe diarrhea - takes laxatives then immodium respectively -   2) neuropathy - chemo induced (oxaliplatin) - primarily affects hands and feet - intol of lyrica due to exac of GI signs - remains on percocet 2x/d for control of same  3) HTN - reports compliance with ongoing medical treatment and no changes in medication dose or frequency. denies adverse side effects related to current therapy.   Preventive Screening-Counseling & Management  Alcohol-Tobacco     Alcohol drinks/day: <1     Alcohol Counseling: not indicated; use of alcohol is not excessive or problematic     Smoking Status: current     Tobacco Counseling: to quit use of tobacco products  Caffeine-Diet-Exercise     Does Patient Exercise: no     Exercise Counseling: to improve exercise regimen     Depression Counseling: not indicated; screening negative for depression  Safety-Violence-Falls     Seat Belt Counseling: not indicated; patient wears seat belts     Helmet Counseling: not indicated; patient wears helmet when riding bicycle/motocycle     Firearm Counseling: not applicable     Violence Counseling: not  applicable     Fall Risk Counseling: not indicated; no significant falls noted  Clinical Review Panels:  Prevention   Last Mammogram:  No specific mammographic evidence of malignancy.   (04/14/2009)   Last Pap Smear:  Interpretation Result:Negative for intraepithelial Lesion or Malignancy.    (04/14/2009)   Last Colonoscopy:  DONE (12/19/2009)   Current Medications (verified): 1)  Hydrochlorothiazide 25 Mg Tabs (Hydrochlorothiazide) .... Take 1 Tablet By Mouth Once A Day 2)  Ferrous Sulfate 325 (65 Fe) Mg Tbec (Ferrous Sulfate) .... Take 1 Tablet By Mouth Once A Day 3)  Prochlorperazine Maleate 10 Mg Tabs (Prochlorperazine Maleate) .... Take 1 Tablet By Mouth Every 6 Hours As Needed For Nausea and Vomtiing 4)  Vitamin B6 (Unknown Dosage) Tablet .... Take 1 Tablet By Mouth Every Day 5)  Anti-Diarrheal 2 Mg Tabs (Loperamide Hcl) .... As Needed 6)  Laxative 25 Mg Tabs (Sennosides) .... As Needed 7)  Proctocort 30 Mg Supp (Hydrocortisone Acetate) .... Insert Rectally At Bedtime 8)  Percocet 5-325 Mg Tabs (Oxycodone-Acetaminophen) .... Take 1 Every Eight Hours As Needed  Allergies (verified): 1)  ! * [place]an  Past History:  Past Medical History: COLON CANCER (adenoca of rectum) - dx 10/2008, LAR surg 02/2009, chemo thru 09/2009 NEUROPATHY- chemo induced -  hands/feet HYPERTENSION DIARRHEA s/p LAR CONSTIPATION  UNSPECIFIED ANEMIA Allergic rhinitis  Md roster: onc - [facility] - [facility] - [facility]  Past Surgical History: Colon Resection 02/2009 Dental Surgery  Family History: Family History of Colon Cancer: Mother - expired age 57y Family History of Stomach Cancer: Sister Family History of Colon Polyps:Sister  Family History of Diabetes: Father - expired age 66 due to DM coma  bro expired from primary pulm HTN age 51 bro expired age 11 from cirrhosis  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  Alcohol Use - yes 1-3 drinks per night Illicit Drug Use - yes-cocaine--stopped 15 yrs ago per patient;  occ MJ Daily Caffeine Use: one daily  Does Patient Exercise:  no  Review of Systems       see HPI above. I have reviewed all other systems and they were negative.   Physical Exam  General:  alert, well-developed, well-nourished, and cooperative to examination.    Head:  Normocephalic and atraumatic without obvious abnormalities. No apparent alopecia or balding. Eyes:  vision grossly intact; pupils equal, round and reactive to light.  conjunctiva and lids normal.    Ears:  normal pinnae bilaterally, without erythema, swelling, or tenderness to palpation. TMs clear, without effusion, or cerumen impaction. Hearing grossly normal bilaterally  Mouth:  teeth and gums in good repair; mucous membranes moist, without lesions or ulcers. oropharynx clear without exudate, no erythema.  Neck:  supple, full ROM, no masses, no thyromegaly; no thyroid nodules or tenderness. no JVD or carotid bruits.   Lungs:  normal respiratory effort, no intercostal retractions or use of accessory muscles; normal breath sounds bilaterally - no crackles and no wheezes.    Heart:  normal rate, regular rhythm, no murmur, and no rub. BLE without edema. normal DP pulses and normal cap refill in all 4 extremities    Abdomen:  soft, non-tender, normal bowel sounds, no distention; no masses and no appreciable hepatomegaly or splenomegaly.   Genitalia:  defer Msk:  No deformity or scoliosis noted of thoracic or lumbar spine.   Neurologic:  alert & oriented X3 and cranial nerves II-XII symetrically intact.  strength normal in all extremities, sensation intact to light touch, and gait normal. speech fluent without dysarthria or aphasia; follows commands with good comprehension.  Skin:  no rashes, vesicles, ulcers, or erythema. No nodules or irregularity to palpation.  Psych:  Oriented X3, memory  intact for recent and remote, normally interactive, good eye contact, not anxious appearing, not depressed appearing, and not agitated.      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[name]... As needed    Lomotil 2.5-0.025 Mg Tabs (Diphenoxylate-atropine) .[name]... 1 by mouth every 6 hours as needed for diarrhea symptoms    Align Caps (Probiotic product) .[name]... 1 by mouth once daily x 30days  Problem # 4:  HYPERTENSION (ICD-401.9) elev today but no meds taken yet - usually well controlled -  recheck 2-3 weeks and add meds as needed  review prior labs and draw next OV as needed  Her updated medication list for this problem includes:    Hydrochlorothiazide 25 Mg Tabs (Hydrochlorothiazide) .[name]... Take 1 tablet by mouth once a day  BP today: 142/88 Prior BP: 126/80 (12/18/2009) Time spent with patient 45 minutes, more than 50% of this time was spent counseling patient on diarrhea, neuropathy and hx cancer as well as  med review and plans for f/u  Complete Medication List: 1)  Hydrochlorothiazide 25 Mg Tabs (Hydrochlorothiazide) .... Take 1 tablet by mouth once a day 2)  Prochlorperazine Maleate 10 Mg Tabs (Prochlorperazine maleate) .... Take 1 tablet by mouth every 6 hours as needed for nausea and vomtiing 3)  Vitamin B6 (unknown Dosage) Tablet  .... Take 1 tablet by mouth every day 4)  Anti-diarrheal 2 Mg Tabs (Loperamide hcl) .... As needed 5)  Laxative 25 Mg Tabs (Sennosides) .... As needed 6)  Proctocort 30 Mg Supp (Hydrocortisone acetate) .... Insert rectally at bedtime 7)  Percocet 5-325 Mg Tabs (Oxycodone-acetaminophen) .... Take 1 every 12 hours as needed 8)  Lomotil 2.5-0.025 Mg Tabs (Diphenoxylate-atropine) .[name].. 1 by mouth every 6 hours as needed for diarrhea symptoms 9)  Gabapentin 100 Mg Caps (Gabapentin) .[name].. 1 by mouth two times a day x 3 days, then 2 by mouth two times a day x 3 days, then 3 by mouth two times a day (or as instructed) 10)  Align Caps (Probiotic product) .[name].. 1 by mouth once daily x 30days  Patient Instructions: 1)  it was good to see you today. 2)  medical history, medications and symptoms reviewed today 3)  start gabapentin for neuropathy and pain symptoms and lomotil for diarhhea symptoms - your prescriptions have been electronically submitted or faxed to your pharmacy. Please take as directed. Contact our office if you believe you're having problems with the medication(s).  4)  also start OTC align for GI health (or phillips colon health) - take qone once daily x 30days to see if this helps 5)  Please schedule a follow-up appointment in 2-3 weeks to review symptoms and medications and recheck blood pressure, call sooner if problems. will do cpx labs at that time Prescriptions: GABAPENTIN 100 MG CAPS (GABAPENTIN) 1 by mouth two times a day x 3 days, then 2 by mouth two times a day x 3 days, then 3 by mouth two times a day (or as instructed)  #180 x 1   Entered and  Authorized by:   Newt Lukes MD   Signed by:   Newt Lukes MD on 04/16/2010   Method used:   Electronically to        [facility] [phone]* (retail)       [address]       [place], [place]  [address]       Ph: [phone]  Fax: [phone]   RxID:   [id number] LOMOTIL 2.5-0.025 MG TABS (DIPHENOXYLATE-ATROPINE) 1 by mouth every 6 hours as needed for diarrhea symptoms  #30 x 0   Entered and Authorized by:   Newt Lukes MD   Signed by:   Newt Lukes MD on 04/16/2010   Method used:   Printed then faxed to ...       [facility] [address] [phone]* (retail)       [address]       [place], [place]  [address]       Ph: [phone]       Fax: [phone]   RxID:   [id number]    Orders Added: 1)  New Patient Level IV [99204] 2)  Prescription Created Electronically [phone]     Pap Smear  Procedure date:  04/14/2009  Findings:      Interpretation Result:Negative for intraepithelial Lesion or Malignancy.     Mammogram  Procedure date:  04/14/2009  Findings:      No specific mammographic evidence of malignancy.

## 2010-05-16 NOTE — Assessment & Plan Note (Signed)
Summary: f/u #/cd   Vital Signs:  Patient profile:   55 year old female Height:      66.5 inches (168.91 cm) Weight:      151 pounds (68.64 kg) O2 Sat:      98 % on Room air Temp:     98.7 degrees F (37.06 degrees C) oral Pulse rate:   80 / minute BP sitting:   122 / 70  (left arm) Cuff size:   regular  Vitals Entered By: Orlan Leavens RMA (May 01, 2010 8:20 AM)  O2 Flow:  Room air CC: 3 week follow-up Is Patient Diabetic? No Pain Assessment Patient in pain? yes     Location: (R) knee Type: aching Onset of pain  Pt states she fell last night injured her knee   Primary Care Provider:  Newt Lukes MD  CC:  3 week follow-up.  History of Present Illness: here for f/u  1) colon ca dx 10/2008 - s/p low ant resection 02/2009 and chemo thru 09/26/09 - f/u colo has been unremarkable - residual constip alt with severe diarrhea - takes laxatives then immodium respectively - started lomotil 04/2010 - much improved, ?keep as scheduled med  2) neuropathy - chemo induced (oxaliplatin) - primarily affects hands and feet - intol of lyrica due to exac of GI signs - remains on percocet 2x/d for control of same - gabapentin started 04/2010 - improving  3) HTN - reports compliance with ongoing medical treatment and no changes in medication dose or frequency. denies adverse side effects related to current therapy. no edema or CP or HA  Preventive Screening-Counseling & Management  Alcohol-Tobacco     Alcohol drinks/day: <1     Alcohol Counseling: not indicated; use of alcohol is not excessive or problematic     Smoking Status: current     Tobacco Counseling: to quit use of tobacco products  Caffeine-Diet-Exercise     Does Patient Exercise: no     Exercise Counseling: to improve exercise regimen     Depression Counseling: not indicated; screening negative for depression  Clinical Review Panels:  Prevention   Last Mammogram:  No specific mammographic evidence of malignancy.  (04/14/2009)   Last Pap Smear:  Interpretation Result:Negative for intraepithelial Lesion or Malignancy.    (04/14/2009)   Last Colonoscopy:  DONE (12/19/2009)   Contraindications/Deferment of Procedures/Staging:    Test/Procedure: FLU VAX    Reason for deferment: patient declined   Current Medications (verified): 1)  Hydrochlorothiazide 25 Mg Tabs (Hydrochlorothiazide) .... Take 1 Tablet By Mouth Once A Day 2)  Prochlorperazine Maleate 10 Mg Tabs (Prochlorperazine Maleate) .... Take 1 Tablet By Mouth Every 6 Hours As Needed For Nausea and Vomtiing 3)  Vitamin B6 (Unknown Dosage) Tablet .... Take 1 Tablet By Mouth Every Day 4)  Anti-Diarrheal 2 Mg Tabs (Loperamide Hcl) .... As Needed 5)  Laxative 25 Mg Tabs (Sennosides) .... As Needed 6)  Proctocort 30 Mg Supp (Hydrocortisone Acetate) .... Insert Rectally At Bedtime 7)  Percocet 5-325 Mg Tabs (Oxycodone-Acetaminophen) .... Take 1 Every 12 Hours As Needed 8)  Lomotil 2.5-0.025 Mg Tabs (Diphenoxylate-Atropine) .[name].. 1 By Mouth Every 6 Hours As Needed For Diarrhea Symptoms 9)  Gabapentin 100 Mg Caps (Gabapentin) .[name].. 1 By Mouth Two Times A Day X 3 Days, Then 2 By Mouth Two Times A Day X 3 Days, Then 3 By Mouth Two Times A Day (Or As Instructed) 10)  Align  Caps (Probiotic Product) .[name].. 1 By Mouth Once Daily  X 30days  Allergies (verified): 1)  ! * Amipicillan  Past History:  Past medical, surgical, family and social histories (including risk factors) reviewed, and no changes noted (except as noted below).  Past Medical History: Reviewed history from 04/16/2010 and no changes required. COLON CANCER (adenoca of rectum) - dx 10/2008, LAR surg 02/2009, chemo thru 09/2009 NEUROPATHY- chemo induced - hands/feet HYPERTENSION DIARRHEA s/p LAR CONSTIPATION  UNSPECIFIED ANEMIA Allergic rhinitis  Md roster: onc - [facility] - [facility] - [facility] - [facility]  Past Surgical History: Reviewed history from 04/16/2010 and no changes  required. Colon Resection 02/2009 Dental Surgery  Family History: Reviewed history from 04/16/2010 and no changes required. Family History of Colon Cancer: Mother - expired age 60y Family History of Stomach Cancer: Sister Family History of Colon Polyps:Sister  Family History of Diabetes: Father - expired age 68 due to DM coma  bro expired from primary pulm HTN age 60 bro expired age 31 from cirrhosis  Social History: Reviewed history from 04/16/2010 and no changes required. Food Service at [employer] Divorced, lives with sister sadie Patient currently smokes.  Alcohol Use - yes 1-3 drinks per night Illicit Drug Use - yes-cocaine--stopped 15 yrs ago per patient;  occ MJ Daily Caffeine Use: one daily   Review of Systems       see HPI above. I have reviewed all other systems and they were negative.   Physical Exam  General:  alert, well-developed, well-nourished, and cooperative to examination.    Lungs:  normal respiratory effort, no intercostal retractions or use of accessory muscles; normal breath sounds bilaterally - no crackles and no wheezes.    Heart:  normal rate, regular rhythm, no murmur, and no rub. BLE without edema. normal DP pulses and normal cap refill in all 4 extremities      Impression & Recommendations:  Problem # 1:  NEUROPATHY (ICD-355.9)  residual from chemo tx (oxaliplatin) - intol of lyrica due to GI exac of diarrhea - improved on gabapentin - cont same ok cont percocet as needed (one  two times a day at this time) - helps diarrhea as well as pain per pt  Problem # 2:  DIARRHEA (ICD-787.91)  Her updated medication list for this problem includes:    Anti-diarrheal 2 Mg Tabs (Loperamide hcl) .[name]... As needed    Lomotil 2.5-0.025 Mg Tabs (Diphenoxylate-atropine) .[name]... 1 by mouth every 6 hours as needed for diarrhea symptoms    Align Caps (Probiotic product) .[name]... 1 by mouth once daily x 30days  hx reviewed - alt severe  diarrhea with constipation - complicated by constipation and diarrhea with otc tx for each with immodium and laxatives - rec use of lomotil as needed with daily probiotoics - reassured recent colo 12/2009 found no residual cancer or surg problems  Problem # 3:  HYPERTENSION (ICD-401.9)  Her updated medication list for this problem includes:    Hydrochlorothiazide 25 Mg Tabs (Hydrochlorothiazide) .[name]... Take 1 tablet by mouth once a day  BP today: 122/70 Prior BP: 142/88 (04/16/2010) Prior BP: 126/80 (12/18/2009)  Complete Medication List: 1)  Hydrochlorothiazide 25 Mg Tabs (Hydrochlorothiazide) .... Take 1 tablet by mouth once a day 2)  Prochlorperazine Maleate 10 Mg Tabs (Prochlorperazine maleate) .... Take 1 tablet by mouth every 6 hours as needed for nausea and vomtiing 3)  Vitamin B6 (unknown Dosage) Tablet  .... Take 1 tablet by mouth every day 4)  Anti-diarrheal 2 Mg Tabs (Loperamide hcl) .Marland KitchenMarland KitchenMarland Kitchen  As needed 5)  Laxative 25 Mg Tabs (Sennosides) .... As needed 6)  Proctocort 30 Mg Supp (Hydrocortisone acetate) .... Insert rectally at bedtime 7)  Percocet 5-325 Mg Tabs (Oxycodone-acetaminophen) .... Take 1 every 12 hours as needed 8)  Lomotil 2.5-0.025 Mg Tabs (Diphenoxylate-atropine) .[name].. 1 by mouth every 6 hours as needed for diarrhea symptoms 9)  Gabapentin 300 Mg Caps (Gabapentin) .[name].. 1 by mouth two times a day 10)  Align Caps (Probiotic product) .[name].. 1 by mouth once daily x 30days  Other Orders: Tdap => 18yrs IM (40981) Admin 1st Vaccine (19147)  Patient Instructions: 1)  it was good to see you today. 2)  continue lomotil as discussed - use 2-4/day to control diarrhea symptoms  3)  continue gabapentin - new prescription for 300mg  caps sent to pharmacy 4)  Tdap given today 5)  Please schedule a follow-up appointment for medical physical and labs, call sooner if problems.  Prescriptions: LOMOTIL 2.5-0.025 MG TABS (DIPHENOXYLATE-ATROPINE) 1 by mouth every 6 hours as needed for  diarrhea symptoms  #100 x 3   Entered and Authorized by:   Newt Lukes MD   Signed by:   Newt Lukes MD on 05/01/2010   Method used:   Printed then faxed to ...       [facility] [address] [phone]* (retail)       [address]       [place], [place]  [address]       Ph: [phone]       Fax: [phone]   RxID:   [id number] GABAPENTIN 300 MG CAPS (GABAPENTIN) 1 by mouth two times a day  #60 x 3   Entered and Authorized by:   Newt Lukes MD   Signed by:   Newt Lukes MD on 05/01/2010   Method used:   Electronically to        [facility] [phone]* (retail)       [address]       [place], [place]  [address]       Ph: [phone]       Fax: [phone]   RxID:   [id number]    Orders Added: 1)  Est. Patient Level III [54270] 2)  Tdap => 27yrs IM [62376] 3)  Admin 1st Vaccine [28315]   Immunizations Administered:  Tetanus Vaccine:    Vaccine Type: Tdap    Site: left deltoid    Mfr: Sanofi Pasteur    Dose: 0.5 ml    Route: IM    Given by: Orlan Leavens RMA    Exp. Date: 07/05/2011    Lot #: [id number]    VIS given: 05/01/10   Immunizations Administered:  Tetanus Vaccine:    Vaccine Type: Tdap    Site: left deltoid    Mfr: Sanofi Pasteur    Dose: 0.5 ml    Route: IM    Given by: Orlan Leavens RMA    Exp. Date: 07/05/2011    Lot #: [id number]    VIS given: 05/01/10

## 2010-05-23 ENCOUNTER — Other Ambulatory Visit: Payer: Self-pay

## 2010-05-24 ENCOUNTER — Telehealth: Payer: Self-pay | Admitting: Internal Medicine

## 2010-05-27 ENCOUNTER — Encounter: Payer: Self-pay | Admitting: Internal Medicine

## 2010-05-30 ENCOUNTER — Encounter (INDEPENDENT_AMBULATORY_CARE_PROVIDER_SITE_OTHER): Payer: Managed Care, Other (non HMO) | Admitting: Internal Medicine

## 2010-05-30 ENCOUNTER — Encounter (INDEPENDENT_AMBULATORY_CARE_PROVIDER_SITE_OTHER): Payer: Self-pay | Admitting: *Deleted

## 2010-05-30 ENCOUNTER — Encounter: Payer: Self-pay | Admitting: Internal Medicine

## 2010-05-30 ENCOUNTER — Other Ambulatory Visit: Payer: Managed Care, Other (non HMO)

## 2010-05-30 ENCOUNTER — Other Ambulatory Visit: Payer: Self-pay | Admitting: Internal Medicine

## 2010-05-30 DIAGNOSIS — E785 Hyperlipidemia, unspecified: Secondary | ICD-10-CM

## 2010-05-30 DIAGNOSIS — C20 Malignant neoplasm of rectum: Secondary | ICD-10-CM

## 2010-05-30 DIAGNOSIS — G589 Mononeuropathy, unspecified: Secondary | ICD-10-CM

## 2010-05-30 DIAGNOSIS — I10 Essential (primary) hypertension: Secondary | ICD-10-CM

## 2010-05-30 DIAGNOSIS — Z0000 Encounter for general adult medical examination without abnormal findings: Secondary | ICD-10-CM

## 2010-05-30 DIAGNOSIS — R197 Diarrhea, unspecified: Secondary | ICD-10-CM

## 2010-05-30 LAB — LIPID PANEL
Cholesterol: 168 mg/dL (ref 0–200)
HDL: 53.1 mg/dL (ref >39.00)
Total CHOL/HDL Ratio: 3
Triglycerides: 354 mg/dL — ABNORMAL HIGH (ref 0.0–149.0)
VLDL: 70.8 mg/dL — ABNORMAL HIGH (ref 0.0–40.0)

## 2010-05-30 LAB — HEPATIC FUNCTION PANEL
ALT: 22 U/L (ref 0–35)
AST: 25 U/L (ref 0–37)
Albumin: 3.8 g/dL (ref 3.5–5.2)
Alkaline Phosphatase: 168 U/L — ABNORMAL HIGH (ref 39–117)
Bilirubin, Direct: 0.2 mg/dL (ref 0.0–0.3)
Total Bilirubin: 1.3 mg/dL — ABNORMAL HIGH (ref 0.3–1.2)
Total Protein: 6.9 g/dL (ref 6.0–8.3)

## 2010-05-30 LAB — CBC WITH DIFFERENTIAL/PLATELET
Basophils Absolute: 0 10*3/uL (ref 0.0–0.1)
Basophils Relative: 0.4 % (ref 0.0–3.0)
Eosinophils Absolute: 0.1 10*3/uL (ref 0.0–0.7)
Eosinophils Relative: 1.7 % (ref 0.0–5.0)
HCT: 39.4 % (ref 36.0–46.0)
Hemoglobin: 13.5 g/dL (ref 12.0–15.0)
Lymphocytes Relative: 19.5 % (ref 12.0–46.0)
Lymphs Abs: 1.2 10*3/uL (ref 0.7–4.0)
MCHC: 34.2 g/dL (ref 30.0–36.0)
MCV: 93.8 fl (ref 78.0–100.0)
Monocytes Absolute: 0.6 10*3/uL (ref 0.1–1.0)
Monocytes Relative: 10.4 % (ref 3.0–12.0)
Neutro Abs: 4 10*3/uL (ref 1.4–7.7)
Neutrophils Relative %: 68 % (ref 43.0–77.0)
Platelets: 316 10*3/uL (ref 150.0–400.0)
RBC: 4.2 Mil/uL (ref 3.87–5.11)
RDW: 14.3 % (ref 11.5–14.6)
WBC: 5.9 10*3/uL (ref 4.5–10.5)

## 2010-05-30 LAB — BASIC METABOLIC PANEL
BUN: 16 mg/dL (ref 6–23)
CO2: 25 mEq/L (ref 19–32)
Calcium: 9.4 mg/dL (ref 8.4–10.5)
Chloride: 110 mEq/L (ref 96–112)
Creatinine, Ser: 0.8 mg/dL (ref 0.4–1.2)
GFR: 93.44 mL/min (ref >60.00)
Glucose, Bld: 83 mg/dL (ref 70–99)
Potassium: 4.7 mEq/L (ref 3.5–5.1)
Sodium: 143 mEq/L (ref 135–145)

## 2010-05-30 LAB — LDL CHOLESTEROL, DIRECT: Direct LDL: 78.9 mg/dL

## 2010-05-30 LAB — TSH: TSH: 0.53 u[IU]/mL (ref 0.35–5.50)

## 2010-06-03 ENCOUNTER — Telehealth: Payer: Self-pay | Admitting: Internal Medicine

## 2010-06-05 ENCOUNTER — Encounter: Payer: Self-pay | Admitting: Physician Assistant

## 2010-06-05 ENCOUNTER — Encounter: Payer: Self-pay | Admitting: Internal Medicine

## 2010-06-05 ENCOUNTER — Other Ambulatory Visit: Payer: Managed Care, Other (non HMO)

## 2010-06-05 ENCOUNTER — Other Ambulatory Visit: Payer: Self-pay | Admitting: Physician Assistant

## 2010-06-05 ENCOUNTER — Ambulatory Visit (INDEPENDENT_AMBULATORY_CARE_PROVIDER_SITE_OTHER): Payer: Managed Care, Other (non HMO) | Admitting: Physician Assistant

## 2010-06-05 ENCOUNTER — Encounter (INDEPENDENT_AMBULATORY_CARE_PROVIDER_SITE_OTHER): Payer: Self-pay | Admitting: *Deleted

## 2010-06-05 DIAGNOSIS — C20 Malignant neoplasm of rectum: Secondary | ICD-10-CM

## 2010-06-05 DIAGNOSIS — Z85038 Personal history of other malignant neoplasm of large intestine: Secondary | ICD-10-CM

## 2010-06-05 DIAGNOSIS — R197 Diarrhea, unspecified: Secondary | ICD-10-CM

## 2010-06-05 DIAGNOSIS — K6289 Other specified diseases of anus and rectum: Secondary | ICD-10-CM

## 2010-06-05 DIAGNOSIS — K625 Hemorrhage of anus and rectum: Secondary | ICD-10-CM | POA: Insufficient documentation

## 2010-06-05 DIAGNOSIS — Z85048 Personal history of other malignant neoplasm of rectum, rectosigmoid junction, and anus: Secondary | ICD-10-CM | POA: Insufficient documentation

## 2010-06-05 LAB — CBC WITH DIFFERENTIAL/PLATELET
Basophils Absolute: 0 10*3/uL (ref 0.0–0.1)
Basophils Relative: 0.2 % (ref 0.0–3.0)
Eosinophils Absolute: 0.1 10*3/uL (ref 0.0–0.7)
Eosinophils Relative: 1 % (ref 0.0–5.0)
HCT: 39.7 % (ref 36.0–46.0)
Hemoglobin: 13.7 g/dL (ref 12.0–15.0)
Lymphocytes Relative: 10.9 % — ABNORMAL LOW (ref 12.0–46.0)
Lymphs Abs: 1 10*3/uL (ref 0.7–4.0)
MCHC: 34.4 g/dL (ref 30.0–36.0)
MCV: 93.2 fl (ref 78.0–100.0)
Monocytes Absolute: 0.6 10*3/uL (ref 0.1–1.0)
Monocytes Relative: 6.4 % (ref 3.0–12.0)
Neutro Abs: 7.5 10*3/uL (ref 1.4–7.7)
Neutrophils Relative %: 81.5 % — ABNORMAL HIGH (ref 43.0–77.0)
Platelets: 319 10*3/uL (ref 150.0–400.0)
RBC: 4.26 Mil/uL (ref 3.87–5.11)
RDW: 14.5 % (ref 11.5–14.6)
WBC: 9.2 10*3/uL (ref 4.5–10.5)

## 2010-06-05 LAB — HIGH SENSITIVITY CRP: CRP, High Sensitivity: 1.72 mg/L (ref 0.00–5.00)

## 2010-06-05 NOTE — Letter (Signed)
Summary: Out of Work  [facility]  [address] [place], [place] [address]   Phone: [phone]  Fax: [phone]    May 30, 2010   Employee:  BENICIA BERGEVIN    To Whom It May Concern:   For Medical reasons, please excuse the above named employee from work for the following dates:  Start: 05/26/10, 05/28/10, and 05/30/10    End: 05/31/10, May return to Work on Friday    If you need additional information, please feel free to contact our office.         Sincerely,    Dr. Rene Paci

## 2010-06-05 NOTE — Progress Notes (Signed)
Summary: Note for work  Phone Note Call from Patient Call back at [employer] [phone] Call back at OR [phone]   Call For: Dr Juanda Chance Reason for Call: Talk to Nurse Summary of Call: Has Col[place] and missed work 05-21-10 thru 2-9 Wonders if she can get a note excusing her from work these days? Will come pick up when ready.  Initial call taken by: Leanor Kail [employer],  May 24, 2010 9:46 AM  Follow-up for Phone Call        Dr Juanda Chance, would you like for me to give patient work excuse? Follow-up by: Lamona Curl CMA Duncan Dull),  May 24, 2010 9:58 AM  Additional Follow-up for Phone Call Additional follow up Details #1::        OK Additional Follow-up by: Hart Carwin MD,  May 24, 2010 10:25 PM    Additional Follow-up for Phone Call Additional follow up Details #2::    work note created and given to patient. Dottie Nelson-Smith CMA Duncan Dull)  May 27, 2010 8:19 AM

## 2010-06-05 NOTE — Letter (Signed)
Summary: Work [facility]  [address] [place], [place] [address]   Phone: [phone]  Fax: [phone]         Today's Date: May 27, 2010  Name of Patient: [name]  The above named patient had a medical condition requiring that she miss time from work.   Please take this into consideration when reviewing the time away from work.    Special Instructions:  [ x ] Other-please excuse patient from work on 05/21/10, 05/22/10 and 05/23/10.   Sincerely yours,     Hart Carwin, MD

## 2010-06-10 ENCOUNTER — Encounter: Payer: Self-pay | Admitting: Physician Assistant

## 2010-06-11 ENCOUNTER — Other Ambulatory Visit: Payer: Self-pay | Admitting: Internal Medicine

## 2010-06-11 ENCOUNTER — Other Ambulatory Visit (AMBULATORY_SURGERY_CENTER): Payer: Managed Care, Other (non HMO) | Admitting: Internal Medicine

## 2010-06-11 ENCOUNTER — Encounter: Payer: Self-pay | Admitting: Internal Medicine

## 2010-06-11 DIAGNOSIS — R197 Diarrhea, unspecified: Secondary | ICD-10-CM

## 2010-06-11 DIAGNOSIS — Z85048 Personal history of other malignant neoplasm of rectum, rectosigmoid junction, and anus: Secondary | ICD-10-CM

## 2010-06-11 DIAGNOSIS — K5120 Ulcerative (chronic) proctitis without complications: Secondary | ICD-10-CM

## 2010-06-11 IMAGING — XA IR US GUIDE VASC ACCESS RIGHT
1 series · 1 of 1 positions shown · non-contrast
Comparison: none

CLINICAL DATA: Rectal carcinoma.  Need for Port-A-Cath for
chemotherapy.

[Series 1000: run · 0.17mm/px · 1 of 1 slices shown]
[im 1/1]
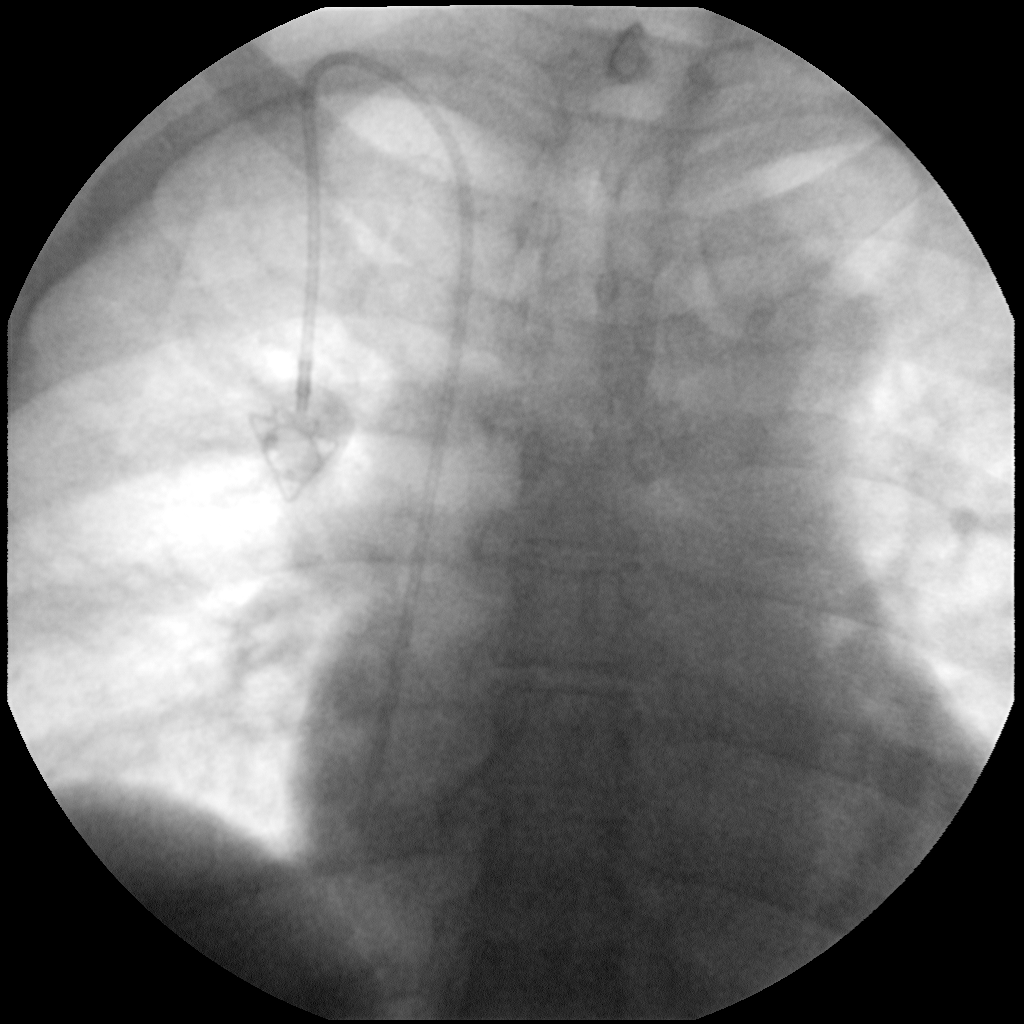

[1 of 1 positions shown; findings below may reference images not displayed]

IMPLANTED PORT A CATH PLACEMENT WITH ULTRASOUND AND FLUOROSCOPIC
GUIDANCE

Sedation:  4.0 mg IV Versed; 100 the mcg IV Fentanyl.

Total Moderate Sedation Time:  45 minutes.

Additional Medications:  1 gram IV Ancef.  IV antibiotic was given
in a appropriate time interval prior to skin puncture.

Fluoroscopy Time:  0.4 minutes.

Procedure:  The procedure, risks, benefits, and alternatives were
explained to the patient.  Questions regarding the procedure were
encouraged and answered.  The patient understands and consents to
the procedure.

The right neck and chest were prepped with chlorhexidine in a
sterile fashion, and a sterile drape was applied covering the
operative field.  Maximum barrier sterile technique with sterile
gowns and gloves were used for the procedure.  Local anesthesia was
provided with 1% lidocaine and lidocaine with epinephrine.

After creating a small venotomy incision, a 21 gauge needle was
advanced into the right internal jugular vein under direct, real-
time ultrasound guidance.  Ultrasound image documentation was
performed.  After securing guidewire access, an 8 Fr dilator was
placed.  A J-wire was kinked to measure appropriate catheter
length.

A subcutaneous port pocket was then created along the upper chest
wall utilizing sharp and blunt dissection.  Portable cautery was
utilized.  The pocket was irrigated with sterile saline.

A single lumen power injectable port was chosen for placement.  The
8 Fr catheter was tunneled from the port pocket site to the
venotomy incision.  The port was placed in the pocket and secured
with two Ethilon tacking sutures.  External catheter was trimmed to
appropriate length based on guidewire measurement.

At the venotomy, an 8 Fr peel-away sheath was placed over a
guidewire.  The catheter was then placed through the sheath and the
sheath removed.  Final catheter positioning was confirmed and
documented with a fluoroscopic spot image.  The port was accessed
with a needle and aspirated and flushed with heparinized saline.
The needle was remove.

The venotomy and port pocket incisions were closed with
subcutaneous 3-0 Monocryl and subcuticular 4-0 Vicryl.  Dermabond
was applied to both incisions.

Complications: None.  No pneumothorax.
FINDINGS: After catheter placement, the tip lies at the cavoatrial
junction.  The catheter aspirates normally and is ready for
immediate use.
IMPRESSION: Placement of single lumen port a cath via right internal jugular
vein.  The catheter tip lies at the cavoatrial junction.  A power
injectable port a cath was placed and is ready for immediate use.

## 2010-06-11 NOTE — Letter (Signed)
Summary: Out of Work  [facility]  [address] [place], [place] [address]   Phone: [phone]  Fax: [phone]    June 05, 2010   Employee:  MAURY BAMBA    To Whom It May Concern:   For Medical reasons, please excuse the above named employee from work for the following dates:  Start:   06-05-2010  End:   06-12-2010  If you need additional information, please feel free to contact our office.         Sincerely,       Amy Essterwood PA-C

## 2010-06-11 NOTE — Progress Notes (Signed)
Summary: triage  Phone Note From Other Clinic Call back at [phone]   Caller: Gavin Pound from Dr Felicity Coyer office Call For: Dr Juanda Chance Reason for Call: Schedule Patient Appt Summary of Call: Dr Felicity Coyer would like this patient seen for chronic diarrhea, theres no available appts. Initial call taken by: Tawni Levy,  June 03, 2010 2:33 PM  Follow-up for Phone Call        [name] and gave her an appointment on 06/05/10 at 10:00 AM with Mike Gip, PA.She will notify the patient Follow-up by: [name] Fall RN,  June 04, 2010 8:42 AM

## 2010-06-11 NOTE — Letter (Signed)
Summary: [facility]  [address] [place], [place] [address]   Phone: [phone]  Fax: [phone]       [name]    November 05, 1955    MRN: [id number]        Procedure Day /Date: 06-11-2010     Arrival Time: 12:30 PM      Procedure Time: 1:30 PM     Location of Procedure:                    X     [facility] (4th Floor)   PREPARATION FOR FLEXIBLE SIGMOIDOSCOPY WITH MAGNESIUM CITRATE  Prior to the day before your procedure, purchase one 8 oz. bottle of Magnesium Citrate and one Fleet Enema from the laxative section of your drugstore.  _________________________________________________________________________________________________  THE DAY BEFORE YOUR PROCEDURE             DATE: 2-272012  Monday 1.   Have a clear liquid dinner the night before your procedure.  2.   Do not drink anything colored red or purple.  Avoid juices with pulp.  No orange juice.              CLEAR LIQUIDS INCLUDE: Water Jello Ice Popsicles Tea (sugar ok, no milk/cream) Powdered fruit flavored drinks Coffee (sugar ok, no milk/cream) Gatorade Juice: apple, white grape, white cranberry  Lemonade Clear bullion, consomm, broth Carbonated beverages (any kind) Strained chicken noodle soup Hard Candy   3.   At 7:00 pm the night before your procedure, drink one bottle of Magnesium Citrate over ice.  4.   Drink at least 3 more glasses of clear liquids before bedtime (preferably juices).  5.   Results are expected usually within 1 to 6 hours after taking the Magnesium Citrate.  ___________________________________________________________________________________________________  THE DAY OF YOUR PROCEDURE            DATE: 06-11-2010  Tuesday  1.   Use Fleet Enema one hour prior to coming for procedure.  2.   You may drink clear liquids until 11:30 AM Before the Exam.       MEDICATION INSTRUCTIONS  Unless otherwise instructed, you should take  regular prescription medications with a small sip of water as early as possible the morning of your procedure.        OTHER INSTRUCTIONS  You will need a responsible adult at least 54 years of age to accompany you and drive you home.   This person must remain in the waiting room during your procedure.  Wear loose fitting clothing that is easily removed.  Leave jewelry and other valuables at home.  However, you may wish to bring a book to read or an iPod/MP3 player to listen to music as you wait for your procedure to start.  Remove all body piercing jewelry and leave at home.  Total time from sign-in until discharge is approximately 2-3 hours.  You should go home directly after your procedure and rest.  You can resume normal activities the day after your procedure.  The day of your procedure you should not:   Drive   Make legal decisions   Operate machinery   Drink alcohol   Return to work  You will receive specific instructions about eating, activities and medications before you leave.   The above instructions have been reviewed and explained to me by   _______________________    I fully understand and can verbalize these instructions _____________________________ Date _________

## 2010-06-11 NOTE — Assessment & Plan Note (Addendum)
Summary: Chronic diarrhea, referral Dr. Felicity Coyer, Gavin Pound, hx Juanda Chance n...   History of Present Illness Visit Type: Initial Consult Primary GI MD: [name] MD Primary Provider: Newt Lukes MD Requesting Provider: Newt Lukes MD Chief Complaint: Pt c/o rectal pain, change in bowel habits, constipation, diarrhea, and hemorrhoids. Pt has history of colorectal cancer  History of Present Illness:   PLEASANT 55 YO FEMALE KNOWN TO DR. Juanda Chance WITH HX OF RECTOSIGMOID ADENOCARCINOMA DX 7/10. SHE UNDERWENT CHEMO/RADIATION ,THEN LOW ANTERIOR RESECTION 11/10. SHE HAD FOLLOW UP COLONOSCOPY IN 9/11 WHICH SHOWED EVIDENCE OF COLITIS IN THE RECTUM/SIGMOID WITH EDEMATOUS MUCOSA AND LOSS OF SUBMUCOSAL BLOOD VESSELS.ALSO SOME MACERATION OF MUCOSA AT THE DENTATE LINE. IT WAS FELT SHE MAY HAVE SOME RADIATION PROCTITIS. BX WAS UNREMARKABLE. SHE WAS GIVEN PROCTOCORT SUPP. PT SAYS SHE HAS HAD SOME CHRONIC DIARRHEA SINCE HER SURGERY, AT TIMES ALTERNATING WITH CONSTIPATION. NOW OVER THE PAST MONTH HAS HAD MUCH WORSE DIARRHEA-HAVING BM'S UP TO 10-15 X PER DAY. STOOL LIQUID OR PENCIL THIN. SHE SEES BLOOD ON THE TISSUE. SHE HAS ALSO BEEN HAVING SHARP  SHOOTING PAINS IN HER RECTUM. APPETITEFAIR, FEELS LIKE EVERYTHING SHE EATS GOES RIGHT THRU. NO ABDOMINAL PAIN. NO FEVER. SHE DENIES ANY ANTIBIOTIC USE IN THE PAST SEVERAL MONTHS. SHE HAS BEEN USING LOMOTIL 6/DAY WITH SOME BENEFIT. STARTED QUESTRAN EARLIER THIS WEEK - NOT SURE IT IS HELPING. SHE IS HAVING A HARD TIME WORKING BECAUSE OF THE DIARRHEA AND URGENCY/WEARING DEPENDS.   GI Review of Systems      Denies abdominal pain, acid reflux, belching, bloating, chest pain, dysphagia with liquids, dysphagia with solids, heartburn, loss of appetite, nausea, vomiting, vomiting blood, weight loss, and  weight gain.      Reports change in bowel habits, constipation, diarrhea, diverticulosis, fecal incontinence, hemorrhoids, rectal bleeding, and  rectal pain.     Denies  anal fissure, black tarry stools, heme positive stool, irritable bowel syndrome, jaundice, light color stool, and  liver problems.    Current Medications (verified): 1)  Hydrochlorothiazide 25 Mg Tabs (Hydrochlorothiazide) .... Take 1 Tablet By Mouth Once A Day 2)  Prochlorperazine Maleate 10 Mg Tabs (Prochlorperazine Maleate) .... As Needed For Nausea and Vomtiing 3)  Vitamin B6 (Unknown Dosage) Tablet .... Take 1 Tablet By Mouth Every Day 4)  Percocet 5-325 Mg Tabs (Oxycodone-Acetaminophen) .... Take 1 Every 12 Hours As Needed 5)  Lomotil 2.5-0.025 Mg Tabs (Diphenoxylate-Atropine) .[name].. 1 By Mouth Every 6 Hours As Needed For Diarrhea Symptoms 6)  Gabapentin 300 Mg Caps (Gabapentin) .[name].. 1 By Mouth Two Times A Day 7)  Questran 4 Gm/dose Powd (Cholestyramine) .... 4g Scoop Three Times A Day Before Each Meal  Allergies (verified): 1)  ! * Amipicillan  Past History:  Past Medical History: COLON CANCER (adenoca of rectum) - dx 10/2008, LAR surg 02/2009, chemo thru 09/2009 NEUROPATHY- chemo induced - hands/feet HYPERTENSION DIARRHEA s/p LAR CONSTIPATION  UNSPECIFIED ANEMIA Allergic rhinitis Colon Polyps  Md roster: onc - [facility] - [facility]  surg - [facility] Du[facility]  Past Surgical History: Colon Resection 02/2009 RECTO-SIGMOID ADENOCARCINOMA Dental Surgery  COLONOSCOPY 9/11 DR. BRODIE  Family History: Reviewed history from 05/30/2010 and no changes required. Family History of Colon Cancer: Mother - expired age 22y Family History of Stomach Cancer: Sister Family History of Colon Polyps:Sister  Family History of Diabetes: Father - expired age 63 due to DM coma  bro expired from primary pulm HTN age 23  bro expired age 74 from cirrhosis  Social History:  Reviewed history from 05/30/2010 and no changes required. 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  Alcohol Use - yes 1-3 drinks per night Illicit Drug  Use - yes-cocaine--stopped 15 yrs ago per patient;  occ MJ Daily Caffeine Use: one daily   Review of Systems       The patient complains of hematochezia and difficulty walking.  The patient denies anorexia, fever, weight loss, weight gain, vision loss, hoarseness, chest pain, syncope, dyspnea on exertion, peripheral edema, prolonged cough, headaches, hemoptysis, abdominal pain, melena, severe indigestion/heartburn, hematuria, incontinence, genital sores, muscle weakness, suspicious skin lesions, unusual weight change, abnormal bleeding, enlarged lymph nodes, and testicular masses.    Vital Signs:  Patient profile:   55 year old female Height:      66.5 inches Weight:      148 pounds BMI:     23.62 BSA:     1.77 Pulse rate:   88 / minute Pulse rhythm:   regular BP sitting:   128 / 76  (left arm) Cuff size:   regular  Vitals Entered By: Ok Anis CMA (June 05, 2010 10:05 AM)  Physical Exam  General:  Well developed, well nourished, no acute distress.THIN Head:  Normocephalic and atraumatic. Eyes:  PERRLA, no icterus. Lungs:  Clear throughout to auscultation. Heart:  Regular rate and rhythm; no murmurs, rubs,  or bruits. 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 Normal mood and affect.anxious.     Impression & Recommendations:  Problem # 1:  DIARRHEA (ICD-787.91) Assessment Deteriorated 55 YO YO FEMALE WITH HX OF RECTO-SIGMOID ADENOCARCINOME 7/10-COMPLETED CHEM/RADIATION, THEN LOW ANTERIOR RESECTION 11/10. PT WITH HX MILD DIARRHEA-PROGRESSIVE X ONE MONTH, AND ASSOCIATED WITH SHARP RECTAL PAIN. R/O RADIATION INDUCED PROCTITIS/COLITIS. R/O RECURRENT MALIGNANCY LABS/STOOL STUDIES AS BELOW SCHEDULE FOR FLEX-SIGMOID WITH DR. Juanda Chance CONTINUE LOMOTIL  6/DAY. Orders: T-Culture, Stool ([id number]) T-C diff by PCR ([id number]) T-Fecal WBC ([id number]) Flex with Sedation (Flex w/Sed) TLB-CRP-High Sensitivity (C-Reactive Protein) (86140-FCRP) TLB-CBC Platelet - w/Differential (85025-CBCD)  Problem # 2:  HYPERTENSION (ICD-401.9) Assessment: Comment Only  Problem # 3:  NEUROPATHY (ICD-355.9) Assessment: Comment Only CHEMO INDUCED  Patient Instructions: 1)  Please go to lab, basement level. 2)  We have scheduled the Flexible Sigmoidoscopy on 06-11-2010. 3)  Directions and brochure provided. 4)  [facility] Patient Information Guide given to patient. 5)  Copy sent to : Dr Otho Najjar 6)  We have given you a work note.

## 2010-06-11 NOTE — Assessment & Plan Note (Signed)
Summary: CPX/NWS # Resch from BMP List/CD   Vital Signs:  Patient profile:   55 year old female Height:      66.5 inches (168.91 cm) Weight:      147 pounds (66.82 kg) O2 Sat:      96 % on Room air Temp:     98.5 degrees F (36.94 degrees C) oral Pulse rate:   88 / minute BP sitting:   120 / 72  (left arm) Cuff size:   regular  Vitals Entered By: Orlan Leavens RMA (May 30, 2010 8:09 AM)  O2 Flow:  Room air CC: CPX Is Patient Diabetic? No Pain Assessment Patient in pain? no        Primary Care Provider:  Newt Lukes MD  CC:  CPX.  History of Present Illness: patient is here today for annual physical. Patient feels well except for severe diarrhea flare - see below  also reviewed chronic med issues: 1) colon ca dx 10/2008 - s/p low ant resection 02/2009 and chemo thru 09/26/09 - f/u colo has been unremarkable - residual constip alt with severe diarrhea - takes laxatives then immodium respectively - started lomotil 04/2010 - much improved until this week - now rectal pain but no soreness - concerned with ?"something not right"  2) neuropathy - chemo induced (oxaliplatin) - primarily affects hands and feet - intol of lyrica due to exac of GI signs - remains on percocet 2x/d for control of same - gabapentin started 04/2010 - improving  3) HTN - reports compliance with ongoing medical treatment and no changes in medication dose or frequency. denies adverse side effects related to current therapy. no edema or CP or HA  Preventive Screening-Counseling & Management  Alcohol-Tobacco     Alcohol drinks/day: <1     Alcohol Counseling: not indicated; use of alcohol is not excessive or problematic     Smoking Status: current     Tobacco Counseling: to quit use of tobacco products  Caffeine-Diet-Exercise     Does Patient Exercise: no     Exercise Counseling: to improve exercise regimen     Depression Counseling: not indicated; screening negative for  depression  Safety-Violence-Falls     Seat Belt Counseling: not indicated; patient wears seat belts     Helmet Counseling: not indicated; patient wears helmet when riding bicycle/motocycle     Firearm Counseling: not applicable     Violence Counseling: not applicable     Fall Risk Counseling: not indicated; no significant falls noted  Clinical Review Panels:  Prevention   Last Mammogram:  No specific mammographic evidence of malignancy.   (04/14/2009)   Last Pap Smear:  Interpretation Result:Negative for intraepithelial Lesion or Malignancy.    (04/14/2009)   Last Colonoscopy:  DONE (12/19/2009)  Immunizations   Last Tetanus Booster:  Tdap (05/01/2010)   Current Medications (verified): 1)  Hydrochlorothiazide 25 Mg Tabs (Hydrochlorothiazide) .... Take 1 Tablet By Mouth Once A Day 2)  Prochlorperazine Maleate 10 Mg Tabs (Prochlorperazine Maleate) .... Take 1 Tablet By Mouth Every 6 Hours As Needed For Nausea and Vomtiing 3)  Vitamin B6 (Unknown Dosage) Tablet .... Take 1 Tablet By Mouth Every Day 4)  Anti-Diarrheal 2 Mg Tabs (Loperamide Hcl) .... As Needed 5)  Laxative 25 Mg Tabs (Sennosides) .... As Needed 6)  Proctocort 30 Mg Supp (Hydrocortisone Acetate) .... Insert Rectally At Bedtime 7)  Percocet 5-325 Mg Tabs (Oxycodone-Acetaminophen) .... Take 1 Every 12 Hours As Needed 8)  Lomotil 2.5-0.025 Mg Tabs (  Diphenoxylate-Atropine) .[name].. 1 By Mouth Every 6 Hours As Needed For Diarrhea Symptoms 9)  Gabapentin 300 Mg Caps (Gabapentin) .[name].. 1 By Mouth Two Times A Day  Allergies (verified): 1)  ! * Amipicillan  Past History:  Past medical, surgical, family and social histories (including risk factors) reviewed, and no changes noted (except as noted below).  Past Medical History: COLON CANCER (adenoca of rectum) - dx 10/2008, LAR surg 02/2009, chemo thru 09/2009 NEUROPATHY- chemo induced - hands/feet HYPERTENSION DIARRHEA s/p LAR CONSTIPATION  UNSPECIFIED ANEMIA Allergic  rhinitis  Md roster: onc - [facility] - [facility]  surg - [facility] Kristin Bruins  Past Surgical History: Colon Resection 02/2009 Dental Surgery   Family History: Reviewed history from 04/16/2010 and no changes required. 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  Alcohol Use - yes 1-3 drinks per night Illicit Drug Use - yes-cocaine--stopped 15 yrs ago per patient;  occ MJ Daily Caffeine Use: one daily   Review of Systems       see HPI above. I have reviewed all other systems and they were negative.   Physical Exam  General:  alert, well-developed, well-nourished, and cooperative to examination.    Head:  Normocephalic and atraumatic without obvious abnormalities. No apparent alopecia or balding. Eyes:  vision grossly intact; pupils equal, round and reactive to light.  conjunctiva and lids normal.    Ears:  normal pinnae bilaterally, without erythema, swelling, or tenderness to palpation. TMs clear, without effusion, or cerumen impaction. Hearing grossly normal bilaterally  Mouth:  teeth and gums in good repair; mucous membranes moist, without lesions or ulcers. oropharynx clear without exudate, no erythema.  Neck:  supple, full ROM, no masses, no thyromegaly; no thyroid nodules or tenderness. no JVD or carotid bruits.   Lungs:  normal respiratory effort, no intercostal retractions or use of accessory muscles; normal breath sounds bilaterally - no crackles and no wheezes.    Heart:  normal rate, regular rhythm, no murmur, and no rub. BLE without edema. normal DP  pulses and normal cap refill in all 4 extremities    Abdomen:  soft, non-tender, normal bowel sounds, no distention; no masses and no appreciable hepatomegaly or splenomegaly.   Genitalia:  defer Msk:  No deformity or scoliosis noted of thoracic or lumbar spine.   Neurologic:  alert & oriented X3 and cranial nerves II-XII symetrically intact.  strength normal in all extremities, sensation intact to light touch, and gait normal. speech fluent without dysarthria or aphasia; follows commands with good comprehension.  Skin:  no rashes, vesicles, ulcers, or erythema. No nodules or irregularity to palpation.  Psych:  Oriented X3, memory intact for recent and remote, normally interactive, good eye contact, not anxious appearing, not depressed appearing, and not agitated.      Impression & Recommendations:  Problem # 1:  PREVENTIVE HEALTH CARE (ICD-V70.0)  Patient has been counseled on age-appropriate routine health concerns for screening and prevention. These are reviewed and up-to-date. Immunizations are up-to-date or declined. Labs and ECG reviewed.   Orders: TLB-BMP (Basic Metabolic Panel-BMET) (80048-METABOL) TLB-CBC  Platelet - w/Differential (85025-CBCD) TLB-Hepatic/Liver Function Pnl (80076-HEPATIC) TLB-TSH (Thyroid Stimulating Hormone) (84443-TSH) TLB-Lipid Panel (80061-LIPID)  Problem # 2:  DIARRHEA (ICD-787.91)  Her updated medication list for this problem includes:    Anti-diarrheal 2 Mg Tabs (Loperamide hcl) .[name]... As needed    Lomotil 2.5-0.025 Mg Tabs (Diphenoxylate-atropine) .[name]... 1 by mouth every 6 hours as needed for diarrhea symptoms  hx reviewed - alt severe diarrhea with constipation - complicated by constipation and diarrhea with otc tx for each with immodium and laxatives - rec cont use of lomotil as needed with daily probiotoics - also add Lanetta Inch - erx done reassured recent colo 12/2009 found no residual cancer or surg problems will refer back to GI and ?surg for  reassurance as needed   Orders: Gastroenterology Referral (GI) Prescription Created Electronically [phone])  Problem # 3:  Hx of ADENOCARCINOMA, RECTUM (ICD-154.1)  follows with GI and onc for same dx 10/2008 - surg 02/2009 - chemo thru 09/2009 clear on f/u colo 12/2009 - residual GI issues related to prior surg - see above - also neuropathy - cont gabapent hx reviewed with pt today  Problem # 4:  NEUROPATHY (ICD-355.9)  residual from chemo tx (oxaliplatin) - intol of lyrica due to GI exac of diarrhea - improved on gabapentin - cont same ok cont percocet as needed (one  two times a day at this time) - helps diarrhea as well as pain per pt  Problem # 5:  HYPERTENSION (ICD-401.9)  Her updated medication list for this problem includes:    Hydrochlorothiazide 25 Mg Tabs (Hydrochlorothiazide) .[name]... Take 1 tablet by mouth once a day  Orders: EKG w/ Interpretation (93000)  Prior BP: 142/88 (04/16/2010) Prior BP: 126/80 (12/18/2009)  BP today: 120/72 Prior BP: 122/70 (05/01/2010)  Complete Medication List: 1)  Hydrochlorothiazide 25 Mg Tabs (Hydrochlorothiazide) .... Take 1 tablet by mouth once a day 2)  Prochlorperazine Maleate 10 Mg Tabs (Prochlorperazine maleate) .... Take 1 tablet by mouth every 6 hours as needed for nausea and vomtiing 3)  Vitamin B6 (unknown Dosage) Tablet  .... Take 1 tablet by mouth every day 4)  Anti-diarrheal 2 Mg Tabs (Loperamide hcl) .... As needed 5)  Laxative 25 Mg Tabs (Sennosides) .... As needed 6)  Proctocort 30 Mg Supp (Hydrocortisone acetate) .... Insert rectally at bedtime 7)  Percocet 5-325 Mg Tabs (Oxycodone-acetaminophen) .... Take 1 every 12 hours as needed 8)  Lomotil 2.5-0.025 Mg Tabs (Diphenoxylate-atropine) .[name].. 1 by mouth every 6 hours as needed for diarrhea symptoms 9)  Gabapentin 300 Mg Caps (Gabapentin) .[name].. 1 by mouth two times a day 10)  Questran 4 Gm/dose Powd (Cholestyramine) .... 4g scoop three times a day before each meal 11)   Analpram-hc 1-2.5 % Lotn (Hydrocortisone ace-pramoxine) .... Apply around rectum three times a day as needed  Patient Instructions: 1)  it was good to see you today. 2)  EKG and exam look fine  3)  test(s) ordered today - your results will be posted on the phone tree for review in 48-72 hours from the time of test completion; call [phone] and enter your 9 digit MRN (listed above on this page, just below your name); if any changes need to be made or there are abnormal results, you will be contacted directly.  4)  we'll make referral to Dr. Juanda Chance (or Dr. Lindie Spruce). Our office will contact you regarding this appointment once made.  5)  In meanwhile, use Questram powder three times a day and Analpram hc as needed  for rectal pain - your prescriptions have been electronically submitted to your pharmacy. Please take as directed. Contact our office if you believe you're having problems with the medication(s). ok to use with lomotil (refills done today) 6)  Followup with gynecology for your PAP/pelvic and mammogram this summer as scheduled 7)  work note given for Feb 12,14 and 16 as requested 8)  Please schedule a follow-up appointment in 4 months to monitor blood pressure, call sooner if problems.  Prescriptions: ANALPRAM-HC 1-2.5 % LOTN (HYDROCORTISONE ACE-PRAMOXINE) apply around rectum three times a day as needed  #1 x 1   Entered and Authorized by:   Newt Lukes MD   Signed by:   Newt Lukes MD on 05/30/2010   Method used:   Electronically to        [facility] [phone]* (retail)       [address]       [place], [place]  [address]       Ph: [phone]       Fax: [phone]   RxID:   [id number] 4 GM/DOSE POWD (CHOLESTYRAMINE) 4g scoop three times a day before each meal  #1 can x 1   Entered and Authorized by:   Newt Lukes MD   Signed by:   Newt Lukes MD on 05/30/2010   Method used:   Electronically to        [facility]  [phone]* (retail)       [address]       [place], [place]  [address]       Ph: [phone]       Fax: [phone]   RxID:   [id number]    Orders Added: 1)  EKG w/ Interpretation [93000] 2)  Est. Patient 40-64 years [99396] 3)  TLB-BMP (Basic Metabolic Panel-BMET) [80048-METABOL] 4)  TLB-CBC Platelet - w/Differential [85025-CBCD] 5)  TLB-Hepatic/Liver Function Pnl [80076-HEPATIC] 6)  TLB-TSH (Thyroid Stimulating Hormone) [84443-TSH] 7)  TLB-Lipid Panel [80061-LIPID] 8)  Gastroenterology Referral [GI] 9)  Est. Patient Level IV [32355] 10)  Prescription Created Electronically [D3220]    Contraindications/Deferment of Procedures/Staging:    Test/Procedure: FLU VAX    Reason for deferment: patient declined

## 2010-06-12 ENCOUNTER — Telehealth (INDEPENDENT_AMBULATORY_CARE_PROVIDER_SITE_OTHER): Payer: Self-pay | Admitting: *Deleted

## 2010-06-12 ENCOUNTER — Telehealth: Payer: Self-pay | Admitting: Internal Medicine

## 2010-06-13 ENCOUNTER — Telehealth: Payer: Self-pay | Admitting: Internal Medicine

## 2010-06-17 ENCOUNTER — Encounter: Payer: Self-pay | Admitting: Internal Medicine

## 2010-06-18 ENCOUNTER — Telehealth (INDEPENDENT_AMBULATORY_CARE_PROVIDER_SITE_OTHER): Payer: Self-pay | Admitting: *Deleted

## 2010-06-20 NOTE — Progress Notes (Signed)
Summary: OTC med  Phone Note Call from Patient Call back at Home Phone [phone]   Caller: Patient Summary of Call: Pt called c/o cold sxs, congestion, cough, ST. Pt is requesting MD advisement on OTC medication that can be used with Rx medication. Pt states she is unable to come in for OV at this time. Initial call taken by: Margaret Pyle, CMA,  June 13, 2010 4:45 PM  Follow-up for Phone Call        robitussin cough/cold per label directions (or mucinex cold/flu) - thanks Follow-up by: Newt Lukes MD,  June 13, 2010 4:55 PM  Additional Follow-up for Phone Call Additional follow up Details #1::        Pt advised of above Additional Follow-up by: Margaret Pyle, CMA,  June 14, 2010 8:08 AM    New/Updated Medications: ROBITUSSIN COUGH/COLD CF 5-10-100 MG/5ML LIQD (PHENYLEPHRINE-DM-GG) as directed (see label)

## 2010-06-20 NOTE — Procedures (Addendum)
Summary: Flexible Sigmoidoscopy  Patient: [name] Note: All result statuses are Final unless otherwise noted.  Tests: (1) Flexible Sigmoidoscopy (FLX)  FLX Flexible Sigmoidoscopy                             DONE     [facility]     [address]     [place], [place]  [address]          FLEXIBLE SIGMOIDOSCOPY PROCEDURE REPORT          PATIENT:  [name], [name]  MR#:  [id number]     BIRTHDATE:  05-07-1955, 55 yrs. old  GENDER:  female          ENDOSCOPIST:  Hedwig Morton. Juanda Chance, MD     Referred by:  Jethro Bolus, M.D.          PROCEDURE DATE:  06/11/2010     PROCEDURE:  Flexible Sigmoidoscopy with biopsy     ASA CLASS:  Class III     INDICATIONS:  hematochezia adenoca rectosigmoid 7/22010, s/p chemo     ans Radiation, s/p ant. resection 02/2009,     colonosc 12/2009 no recurrent cancer     frequent diarrhea and incontinence          MEDICATIONS:   Versed 5 mg, Fentanyl 50 mcg          DESCRIPTION OF PROCEDURE:   After the risks benefits and     alternatives of the procedure were thoroughly explained, informed     consent was obtained.  Digital rectal exam was performed and     revealed decreased sphincter tone.   The [id number]     endoscope was introduced through the anus and advanced to the     descending colon, without limitations.  The quality of the prep     was poor.  The instrument was then slowly withdrawn as the mucosa     was fully examined.     <<PROCEDUREIMAGES>>          A postop change was noted. anastomosis at 5 cm, wide open,     essentially absent rectal shincter tone, mild erythema of the     pouch With standard forceps, biopsy was obtained and sent to     pathology (see image7, image6, image8, and image9).  Mild     diverticulosis was found (see image1).  The examination was     otherwise normal (see image2, image3, image4, and image5).     Retroflexed views in the rectum revealed not possible.    The scope     was then withdrawn from  the patient and the procedure terminated.          COMPLICATIONS:  None          ENDOSCOPIC IMPRESSION:     1) Postop change     2) Mild diverticulosis     3) Otherwise normal examination.     4) Not possible     laxed rectal spincter tone, mild radiation proctitis, wide open     anastomosis at 5 cm- very low lying.     RECOMMENDATIONS:     1) await biopsy results     low residue diet to decrease the stool bulk     Questran 4 gm daily. #30 packa, 2 refills     Bentyl 20 mg bid, #60, 2 refills  OV 6 weeks     may need colostomy if incontinence not controlled,     trial of Pelvic floor and incontinence clinic consultation          REPEAT EXAM:  In 2 year(s) for.          ______________________________     Hedwig Morton. Juanda Chance, MD          CC:          n.     eSIGNED:   Hedwig Morton. Lavance Beazer at 06/11/2010 02:13 PM          Lenna Sciara, [phone]  Note: An exclamation mark (!) indicates a result that was not dispersed into the flowsheet. Document Creation Date: 06/11/2010 2:13 PM _______________________________________________________________________  (1) Order result status: Final Collection or observation date-time: 06/11/2010 13:55 Requested date-time:  Receipt date-time:  Reported date-time:  Referring Physician:   Ordering Physician: [name] [phone]) Specimen Source:  Source: Launa Grill Order Number: [id number] Lab site:   Appended Document: Flexible Sigmoidoscopy     Procedures Next Due Date:    Colonoscopy: 06/2013

## 2010-06-20 NOTE — Progress Notes (Signed)
  Phone Note Outgoing Call   Call placed by: [name] Fall, RN Call placed to: Patient Summary of Call: Called and gave patient appointment to see Dr. Juanda Chance on 07/23/10 at 8:30 AM Initial call taken by: [name] Fall RN,  June 12, 2010 8:47 AM

## 2010-06-20 NOTE — Miscellaneous (Signed)
Summary: new med  Clinical Lists Changes  Medications: Added new medication of BENTYL 20 MG  TABS (DICYCLOMINE HCL) take twice daily - Signed Added new medication of QUESTRAN 4 GM  PACK (CHOLESTYRAMINE) take one pack daily - Signed Rx of BENTYL 20 MG  TABS (DICYCLOMINE HCL) take twice daily;  #60 x 2;  Signed;  Entered by: Oda Cogan RN;  Authorized by: Hart Carwin MD;  Method used: Electronically to [facility] [phone]*, [address], [place], [place]  [address], Ph: [phone], Fax: [phone] Rx of QUESTRAN 4 GM  PACK (CHOLESTYRAMINE) take one pack daily;  #30 x 2;  Signed;  Entered by: Oda Cogan RN;  Authorized by: Hart Carwin MD;  Method used: Electronically to [facility] [phone]*, [address] [place], [place]  [address], Ph: [phone], Fax: [phone]    Prescriptions: Lanetta Inch 4 GM  PACK (CHOLESTYRAMINE) take one pack daily  #30 x 2   Entered by:   Oda Cogan RN   Authorized by:   Hart Carwin MD   Signed by:   Oda Cogan RN on 06/11/2010   Method used:   Electronically to        [facility] [phone]* (retail)       [address]       [place], [place]  [address]       Ph: [phone]       Fax: [phone]   RxID:   [id number] BENTYL 20 MG  TABS (DICYCLOMINE HCL) take twice daily  #60 x 2   Entered by:   Oda Cogan RN   Authorized by:   Hart Carwin MD   Signed by:   Oda Cogan RN on 06/11/2010   Method used:   Electronically to        [facility] [phone]* (retail)       [address]       [place], [place]  [address]       Ph: [phone]       Fax: [phone]   RxID:   [id number]

## 2010-06-20 NOTE — Progress Notes (Signed)
  Phone Note Outgoing Call   Call placed by: Joylene John RN,  June 12, 2010 8:07 AM  Follow-up for Phone Call        Did follow up call this am--pt c/o rectal pain 4 of 5--she hasnt picked up the Questran or bentyl as prescribed yesterday--states continues to have diarrhea--did take lomotil yest but this wasnt helping prior to yesterday--no changes--pt instructed to pick up scripts at pharmacy and schedule OV in 6 weeks...any other instructions need to be given? Follow-up by: Joylene John RN,  June 12, 2010 8:10 AM  Additional Follow-up for Phone Call Additional follow up Details #1::        no, we will refer her to incontinence clinic when I see her in the office. Additional Follow-up by: Hart Carwin MD,  June 12, 2010 9:04 AM    Additional Follow-up for Phone Call Additional follow up Details #2::    Pt called and informed of md instructions...informed to pick up scripts today from pharmacy. instructed pt if pain worsens or changes to please call the office asap. also to call with questions  Pt states she has alreasdy received reck appt in 6 weeks with Dr Juanda Chance

## 2010-06-25 NOTE — Progress Notes (Addendum)
----   Converted from flag ---- ---- 06/17/2010 8:29 PM, Hart Carwin MD wrote: Dagoberto Reef, please send Canasa supp 1000mg , #30, insert 1 at bedtime x 2 weeks then 3x/week. 1 refill. ------------------------------  Phone Note Outgoing Call Call back at Home Phone [phone]   Call placed by: [name] Fall, RN Call placed to: Patient Summary of Call: Message left for patient to call back. [name] Fall, RN 06/18/10 10:37 AM Spoke with patient re: Canasa supp. rx as per Dr. Juanda Chance. Patient wants to wait to have the rx called in until next week because she is on a fixed income and she will get paid next week and can pick up the rx.  Initial call taken by: [name] Fall RN,  June 18, 2010 11:32 AM  Follow-up for Phone Call        reviewed and agree. Follow-up by: Hart Carwin MD,  June 18, 2010 9:46 PM     Appended Document:     Clinical Lists Changes  Medications: Added new medication of CANASA 1000 MG SUPP (MESALAMINE) Insert one per rectum at bedtime x 2 weeks then insert one per rectum at bedtime 3 times per week. - Signed Rx of CANASA 1000 MG SUPP (MESALAMINE) Insert one per rectum at bedtime x 2 weeks then insert one per rectum at bedtime 3 times per week.;  #30 x 1;  Signed;  Entered by: [name] Fall RN;  Authorized by: Hart Carwin MD;  Method used: Electronically to [facility] [phone]*, [address] [place], [place]  [address], Ph: [phone], Fax: [phone]    Prescriptions: CANASA 1000 MG SUPP (MESALAMINE) Insert one per rectum at bedtime x 2 weeks then insert one per rectum at bedtime 3 times per week.  #30 x 1   Entered by:   [name] Fall RN   Authorized by:   Hart Carwin MD   Signed by:   [name] Fall RN on 06/24/2010   Method used:   Electronically to        [facility] [phone]* (retail)       [address]       [place], [place]  [address]       Ph: [phone]       Fax: [phone]   RxID:   [id number]    Appended  Document:  Rx sent to patients pharmacy as per patient request.

## 2010-06-25 NOTE — Letter (Addendum)
Summary: Patient Notice- Colon Biospy Results  [facility]  [address] [place], [place] [address]   Phone: [phone]  Fax: [phone]        June 17, 2010 MRN: [id number]    [facility] [address] [place], [place]  [address]    Dear Ms. GRAHAM,  I am pleased to inform you that the biopsies taken during your recent colonoscopy did not show any evidence of cancer upon pathologic examination.The rectal biopsies show inflammation due to radiation.  Additional information/recommendations:  __No further action is needed at this time.  Please follow-up with      your primary care physician for your other healthcare needs.  _x_Please call [phone] to schedule a return visit to review      your condition.  _x_Continue with the treatment plan as outlined on the day of your      exam.We will send You a new prescription for Canasa suppository 1000mg , to be inserted every night for treatment of the colitis.  _x_You should have a repeat colonoscopy examination for this problem           in 3_ years.  Please call us if you are having persistent problems or have questions about your condition that have not been fully answered at this time.  Sincerely,  Hart Carwin MD   This letter has been electronically signed by your physician.  Appended Document: Patient Notice- Colon Biospy Results letter mailed

## 2010-06-30 LAB — BASIC METABOLIC PANEL
BUN: 14 mg/dL (ref 6–23)
CO2: 29 mEq/L (ref 19–32)
Calcium: 9.6 mg/dL (ref 8.4–10.5)
Chloride: 101 mEq/L (ref 96–112)
Creatinine, Ser: 0.87 mg/dL (ref 0.4–1.2)
GFR calc Af Amer: >60 mL/min (ref >60)
GFR calc non Af Amer: >60 mL/min (ref >60)
Glucose, Bld: 161 mg/dL — ABNORMAL HIGH (ref 70–99)
Potassium: 3.6 mEq/L (ref 3.5–5.1)
Sodium: 138 mEq/L (ref 135–145)

## 2010-06-30 LAB — SURGICAL PCR SCREEN
MRSA, PCR: NEGATIVE
Staphylococcus aureus: NEGATIVE

## 2010-06-30 LAB — CBC
HCT: 35.5 % — ABNORMAL LOW (ref 36.0–46.0)
Hemoglobin: 12.2 g/dL (ref 12.0–15.0)
MCH: 34 pg (ref 26.0–34.0)
MCHC: 34.3 g/dL (ref 30.0–36.0)
MCV: 99.2 fL (ref 78.0–100.0)
Platelets: 260 10*3/uL (ref 150–400)
RBC: 3.58 MIL/uL — ABNORMAL LOW (ref 3.87–5.11)
RDW: 19.5 % — ABNORMAL HIGH (ref 11.5–15.5)
WBC: 7.9 10*3/uL (ref 4.0–10.5)

## 2010-06-30 LAB — APTT: aPTT: 33 seconds (ref 24–37)

## 2010-06-30 LAB — PROTIME-INR
INR: 0.97 (ref 0.00–1.49)
Prothrombin Time: 12.8 seconds (ref 11.6–15.2)

## 2010-07-09 ENCOUNTER — Other Ambulatory Visit: Payer: Self-pay | Admitting: Internal Medicine

## 2010-07-09 NOTE — Telephone Encounter (Signed)
Doctor, general practice Pharmacy at [facility]. They state that they do have rx on hold. They filled it originally but put it back on shelf as patient never picked it up. They will now fill rx again. Patient advised.

## 2010-07-10 ENCOUNTER — Other Ambulatory Visit: Payer: Self-pay | Admitting: Oncology

## 2010-07-10 ENCOUNTER — Encounter (HOSPITAL_BASED_OUTPATIENT_CLINIC_OR_DEPARTMENT_OTHER): Payer: Managed Care, Other (non HMO) | Admitting: Oncology

## 2010-07-10 DIAGNOSIS — D509 Iron deficiency anemia, unspecified: Secondary | ICD-10-CM

## 2010-07-10 DIAGNOSIS — R112 Nausea with vomiting, unspecified: Secondary | ICD-10-CM

## 2010-07-10 DIAGNOSIS — C20 Malignant neoplasm of rectum: Secondary | ICD-10-CM

## 2010-07-10 DIAGNOSIS — C19 Malignant neoplasm of rectosigmoid junction: Secondary | ICD-10-CM

## 2010-07-10 DIAGNOSIS — Z5111 Encounter for antineoplastic chemotherapy: Secondary | ICD-10-CM

## 2010-07-10 LAB — COMPREHENSIVE METABOLIC PANEL
ALT: 22 U/L (ref 0–35)
AST: 25 U/L (ref 0–37)
Albumin: 3.9 g/dL (ref 3.5–5.2)
Alkaline Phosphatase: 149 U/L — ABNORMAL HIGH (ref 39–117)
BUN: 10 mg/dL (ref 6–23)
CO2: 23 mEq/L (ref 19–32)
Calcium: 9.1 mg/dL (ref 8.4–10.5)
Chloride: 106 mEq/L (ref 96–112)
Creatinine, Ser: 0.76 mg/dL (ref 0.40–1.20)
Glucose, Bld: 149 mg/dL — ABNORMAL HIGH (ref 70–99)
Potassium: 3.3 mEq/L — ABNORMAL LOW (ref 3.5–5.3)
Sodium: 140 mEq/L (ref 135–145)
Total Bilirubin: 0.4 mg/dL (ref 0.3–1.2)
Total Protein: 6.1 g/dL (ref 6.0–8.3)

## 2010-07-10 LAB — CBC WITH DIFFERENTIAL/PLATELET
BASO%: 0.4 % (ref 0.0–2.0)
Basophils Absolute: 0 10*3/uL (ref 0.0–0.1)
EOS%: 1.6 % (ref 0.0–7.0)
Eosinophils Absolute: 0.1 10*3/uL (ref 0.0–0.5)
HCT: 38.6 % (ref 34.8–46.6)
HGB: 12.8 g/dL (ref 11.6–15.9)
LYMPH%: 18.2 % (ref 14.0–49.7)
MCH: 30.3 pg (ref 25.1–34.0)
MCHC: 33.2 g/dL (ref 31.5–36.0)
MCV: 91.3 fL (ref 79.5–101.0)
MONO#: 0.4 10*3/uL (ref 0.1–0.9)
MONO%: 8 % (ref 0.0–14.0)
NEUT#: 3.7 10*3/uL (ref 1.5–6.5)
NEUT%: 71.8 % (ref 38.4–76.8)
Platelets: 208 10*3/uL (ref 145–400)
RBC: 4.23 10*6/uL (ref 3.70–5.45)
RDW: 14.2 % (ref 11.2–14.5)
WBC: 5.1 10*3/uL (ref 3.9–10.3)
lymph#: 0.9 10*3/uL (ref 0.9–3.3)
nRBC: 0 % (ref 0–0)

## 2010-07-10 LAB — CEA: CEA: 0.9 ng/mL (ref 0.0–5.0)

## 2010-07-17 LAB — PROTIME-INR
INR: 0.9 (ref 0.00–1.49)
Prothrombin Time: 12.1 seconds (ref 11.6–15.2)

## 2010-07-17 LAB — CEA: CEA: 0.5 ng/mL (ref 0.0–5.0)

## 2010-07-17 LAB — CROSSMATCH
ABO/RH(D): O POS
Antibody Screen: NEGATIVE

## 2010-07-17 LAB — DIFFERENTIAL
Basophils Absolute: 0 10*3/uL (ref 0.0–0.1)
Basophils Relative: 0 % (ref 0–1)
Eosinophils Absolute: 0.1 10*3/uL (ref 0.0–0.7)
Eosinophils Relative: 1 % (ref 0–5)
Lymphocytes Relative: 12 % (ref 12–46)
Lymphs Abs: 0.7 10*3/uL (ref 0.7–4.0)
Monocytes Absolute: 0.5 10*3/uL (ref 0.1–1.0)
Monocytes Relative: 8 % (ref 3–12)
Neutro Abs: 4.7 10*3/uL (ref 1.7–7.7)
Neutrophils Relative %: 79 % — ABNORMAL HIGH (ref 43–77)

## 2010-07-17 LAB — COMPREHENSIVE METABOLIC PANEL
ALT: 19 U/L (ref 0–35)
AST: 21 U/L (ref 0–37)
Albumin: 3.9 g/dL (ref 3.5–5.2)
Alkaline Phosphatase: 93 U/L (ref 39–117)
BUN: 12 mg/dL (ref 6–23)
CO2: 23 mEq/L (ref 19–32)
Calcium: 9.7 mg/dL (ref 8.4–10.5)
Chloride: 106 mEq/L (ref 96–112)
Creatinine, Ser: 0.68 mg/dL (ref 0.4–1.2)
GFR calc Af Amer: >60 mL/min (ref >60)
GFR calc non Af Amer: >60 mL/min (ref >60)
Glucose, Bld: 133 mg/dL — ABNORMAL HIGH (ref 70–99)
Potassium: 3.8 mEq/L (ref 3.5–5.1)
Sodium: 137 mEq/L (ref 135–145)
Total Bilirubin: 0.9 mg/dL (ref 0.3–1.2)
Total Protein: 7.3 g/dL (ref 6.0–8.3)

## 2010-07-17 LAB — CBC
HCT: 31 % — ABNORMAL LOW (ref 36.0–46.0)
HCT: 35.9 % — ABNORMAL LOW (ref 36.0–46.0)
Hemoglobin: 10.2 g/dL — ABNORMAL LOW (ref 12.0–15.0)
Hemoglobin: 11.8 g/dL — ABNORMAL LOW (ref 12.0–15.0)
MCHC: 32.9 g/dL (ref 30.0–36.0)
MCHC: 33 g/dL (ref 30.0–36.0)
MCV: 82.7 fL (ref 78.0–100.0)
MCV: 83.6 fL (ref 78.0–100.0)
Platelets: 384 10*3/uL (ref 150–400)
Platelets: 394 10*3/uL (ref 150–400)
RBC: 3.71 MIL/uL — ABNORMAL LOW (ref 3.87–5.11)
RBC: 4.34 MIL/uL (ref 3.87–5.11)
RDW: 22.2 % — ABNORMAL HIGH (ref 11.5–15.5)
RDW: 22.3 % — ABNORMAL HIGH (ref 11.5–15.5)
WBC: 10.8 10*3/uL — ABNORMAL HIGH (ref 4.0–10.5)
WBC: 6 10*3/uL (ref 4.0–10.5)

## 2010-07-21 LAB — CBC
HCT: 21.7 % — ABNORMAL LOW (ref 36.0–46.0)
HCT: 30 % — ABNORMAL LOW (ref 36.0–46.0)
HCT: 31.4 % — ABNORMAL LOW (ref 36.0–46.0)
HCT: 32.4 % — ABNORMAL LOW (ref 36.0–46.0)
HCT: 36.1 % (ref 36.0–46.0)
Hemoglobin: 11 g/dL — ABNORMAL LOW (ref 12.0–15.0)
Hemoglobin: 6 g/dL — CL (ref 12.0–15.0)
Hemoglobin: 9.3 g/dL — ABNORMAL LOW (ref 12.0–15.0)
Hemoglobin: 9.8 g/dL — ABNORMAL LOW (ref 12.0–15.0)
Hemoglobin: 9.9 g/dL — ABNORMAL LOW (ref 12.0–15.0)
MCHC: 27.6 g/dL — ABNORMAL LOW (ref 30.0–36.0)
MCHC: 30.6 g/dL (ref 30.0–36.0)
MCHC: 30.6 g/dL (ref 30.0–36.0)
MCHC: 31.1 g/dL (ref 30.0–36.0)
MCHC: 31.3 g/dL (ref 30.0–36.0)
MCV: 58 fL — ABNORMAL LOW (ref 78.0–100.0)
MCV: 69.9 fL — ABNORMAL LOW (ref 78.0–100.0)
MCV: 70 fL — ABNORMAL LOW (ref 78.0–100.0)
MCV: 70.2 fL — ABNORMAL LOW (ref 78.0–100.0)
MCV: 70.2 fL — ABNORMAL LOW (ref 78.0–100.0)
Platelets: 376 10*3/uL (ref 150–400)
Platelets: 402 10*3/uL — ABNORMAL HIGH (ref 150–400)
Platelets: 429 10*3/uL — ABNORMAL HIGH (ref 150–400)
Platelets: 487 10*3/uL — ABNORMAL HIGH (ref 150–400)
Platelets: 523 10*3/uL — ABNORMAL HIGH (ref 150–400)
RBC: 3.74 MIL/uL — ABNORMAL LOW (ref 3.87–5.11)
RBC: 4.29 MIL/uL (ref 3.87–5.11)
RBC: 4.48 MIL/uL (ref 3.87–5.11)
RBC: 4.63 MIL/uL (ref 3.87–5.11)
RBC: 5.14 MIL/uL — ABNORMAL HIGH (ref 3.87–5.11)
RDW: 21.3 % — ABNORMAL HIGH (ref 11.5–15.5)
RDW: 31.9 % — ABNORMAL HIGH (ref 11.5–15.5)
RDW: 32.2 % — ABNORMAL HIGH (ref 11.5–15.5)
RDW: 32.5 % — ABNORMAL HIGH (ref 11.5–15.5)
RDW: 32.7 % — ABNORMAL HIGH (ref 11.5–15.5)
WBC: 6.1 10*3/uL (ref 4.0–10.5)
WBC: 7.2 10*3/uL (ref 4.0–10.5)
WBC: 7.3 10*3/uL (ref 4.0–10.5)
WBC: 7.6 10*3/uL (ref 4.0–10.5)
WBC: 9 10*3/uL (ref 4.0–10.5)

## 2010-07-21 LAB — CROSSMATCH
ABO/RH(D): O POS
Antibody Screen: NEGATIVE

## 2010-07-21 LAB — DIFFERENTIAL
Basophils Absolute: 0 10*3/uL (ref 0.0–0.1)
Basophils Absolute: 0.1 10*3/uL (ref 0.0–0.1)
Basophils Relative: 0 % (ref 0–1)
Basophils Relative: 1 % (ref 0–1)
Eosinophils Absolute: 0.1 10*3/uL (ref 0.0–0.7)
Eosinophils Absolute: 0.2 10*3/uL (ref 0.0–0.7)
Eosinophils Relative: 1 % (ref 0–5)
Eosinophils Relative: 2 % (ref 0–5)
Lymphocytes Relative: 16 % (ref 12–46)
Lymphocytes Relative: 25 % (ref 12–46)
Lymphs Abs: 1.4 10*3/uL (ref 0.7–4.0)
Lymphs Abs: 1.9 10*3/uL (ref 0.7–4.0)
Monocytes Absolute: 0.5 10*3/uL (ref 0.1–1.0)
Monocytes Absolute: 0.5 10*3/uL (ref 0.1–1.0)
Monocytes Relative: 6 % (ref 3–12)
Monocytes Relative: 6 % (ref 3–12)
Neutro Abs: 5.1 10*3/uL (ref 1.7–7.7)
Neutro Abs: 6.8 10*3/uL (ref 1.7–7.7)
Neutrophils Relative %: 68 % (ref 43–77)
Neutrophils Relative %: 75 % (ref 43–77)
Smear Review: INCREASED

## 2010-07-21 LAB — BASIC METABOLIC PANEL
BUN: 2 mg/dL — ABNORMAL LOW (ref 6–23)
BUN: 4 mg/dL — ABNORMAL LOW (ref 6–23)
BUN: 4 mg/dL — ABNORMAL LOW (ref 6–23)
CO2: 24 mEq/L (ref 19–32)
CO2: 25 mEq/L (ref 19–32)
CO2: 26 mEq/L (ref 19–32)
Calcium: 8.8 mg/dL (ref 8.4–10.5)
Calcium: 9 mg/dL (ref 8.4–10.5)
Calcium: 9.1 mg/dL (ref 8.4–10.5)
Chloride: 109 mEq/L (ref 96–112)
Chloride: 109 mEq/L (ref 96–112)
Chloride: 111 mEq/L (ref 96–112)
Creatinine, Ser: 0.59 mg/dL (ref 0.4–1.2)
Creatinine, Ser: 0.65 mg/dL (ref 0.4–1.2)
Creatinine, Ser: 0.73 mg/dL (ref 0.4–1.2)
GFR calc Af Amer: >60 mL/min (ref >60)
GFR calc Af Amer: >60 mL/min (ref >60)
GFR calc Af Amer: >60 mL/min (ref >60)
GFR calc non Af Amer: >60 mL/min (ref >60)
GFR calc non Af Amer: >60 mL/min (ref >60)
GFR calc non Af Amer: >60 mL/min (ref >60)
Glucose, Bld: 103 mg/dL — ABNORMAL HIGH (ref 70–99)
Glucose, Bld: 134 mg/dL — ABNORMAL HIGH (ref 70–99)
Glucose, Bld: 90 mg/dL (ref 70–99)
Potassium: 3.3 mEq/L — ABNORMAL LOW (ref 3.5–5.1)
Potassium: 4 mEq/L (ref 3.5–5.1)
Potassium: 4.3 mEq/L (ref 3.5–5.1)
Sodium: 140 mEq/L (ref 135–145)
Sodium: 141 mEq/L (ref 135–145)
Sodium: 141 mEq/L (ref 135–145)

## 2010-07-21 LAB — COMPREHENSIVE METABOLIC PANEL
ALT: 13 U/L (ref 0–35)
ALT: 17 U/L (ref 0–35)
AST: 21 U/L (ref 0–37)
AST: 28 U/L (ref 0–37)
Albumin: 3.7 g/dL (ref 3.5–5.2)
Albumin: 3.7 g/dL (ref 3.5–5.2)
Alkaline Phosphatase: 101 U/L (ref 39–117)
Alkaline Phosphatase: 81 U/L (ref 39–117)
BUN: 11 mg/dL (ref 6–23)
BUN: 3 mg/dL — ABNORMAL LOW (ref 6–23)
CO2: 23 mEq/L (ref 19–32)
CO2: 24 mEq/L (ref 19–32)
Calcium: 9.4 mg/dL (ref 8.4–10.5)
Calcium: 9.5 mg/dL (ref 8.4–10.5)
Chloride: 107 mEq/L (ref 96–112)
Chloride: 109 mEq/L (ref 96–112)
Creatinine, Ser: 0.62 mg/dL (ref 0.4–1.2)
Creatinine, Ser: 0.7 mg/dL (ref 0.4–1.2)
GFR calc Af Amer: >60 mL/min (ref >60)
GFR calc Af Amer: >60 mL/min (ref >60)
GFR calc non Af Amer: >60 mL/min (ref >60)
GFR calc non Af Amer: >60 mL/min (ref >60)
Glucose, Bld: 105 mg/dL — ABNORMAL HIGH (ref 70–99)
Glucose, Bld: 123 mg/dL — ABNORMAL HIGH (ref 70–99)
Potassium: 3.4 mEq/L — ABNORMAL LOW (ref 3.5–5.1)
Potassium: 3.9 mEq/L (ref 3.5–5.1)
Sodium: 139 mEq/L (ref 135–145)
Sodium: 139 mEq/L (ref 135–145)
Total Bilirubin: 0.9 mg/dL (ref 0.3–1.2)
Total Bilirubin: 0.9 mg/dL (ref 0.3–1.2)
Total Protein: 6.7 g/dL (ref 6.0–8.3)
Total Protein: 6.8 g/dL (ref 6.0–8.3)

## 2010-07-21 LAB — CEA
CEA: 1.2 ng/mL (ref 0.0–5.0)
CEA: 1.2 ng/mL (ref 0.0–5.0)

## 2010-07-21 LAB — ABO/RH: ABO/RH(D): O POS

## 2010-07-23 ENCOUNTER — Ambulatory Visit (INDEPENDENT_AMBULATORY_CARE_PROVIDER_SITE_OTHER): Payer: Managed Care, Other (non HMO) | Admitting: Internal Medicine

## 2010-07-23 ENCOUNTER — Encounter: Payer: Self-pay | Admitting: Internal Medicine

## 2010-07-23 DIAGNOSIS — Z85048 Personal history of other malignant neoplasm of rectum, rectosigmoid junction, and anus: Secondary | ICD-10-CM

## 2010-07-23 DIAGNOSIS — R159 Full incontinence of feces: Secondary | ICD-10-CM

## 2010-07-23 DIAGNOSIS — T8189XA Other complications of procedures, not elsewhere classified, initial encounter: Secondary | ICD-10-CM

## 2010-07-23 DIAGNOSIS — K520 Gastroenteritis and colitis due to radiation: Secondary | ICD-10-CM

## 2010-07-23 NOTE — Patient Instructions (Addendum)
You have been scheduled to see Ruben Gottron, Physical Therapist @ [facility] for assessment of your pelvic floor and for rectal sphincter incontinence. Your appointment is scheduled for Aug 15, 2010 @ 9 am. Please make certain to bring ALL medications, a picture ID and insurance cards as well as any copay you may have. [facility] will be sending you new patient paperwork in the mail. CC: Ruben Gottron , PT [facility]

## 2010-07-23 NOTE — Progress Notes (Signed)
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 A colonoscopy in September 2011 showed no recurrence. A flexible sigmoidoscopy in February 2012 for evaluation of fecal incontinence and diarrhea showed a patent anastomosis at 5 cm, decreased rectal sphincter tone and radiation proctitis with ulcerations. She continues to have accidents on a daily basis. 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 She complains of rectal soreness and  odor. She is afraid to go out. She had a bowel movement during intercourse which embarrassed her. She is somewhat depressed about her status. She usually does not eat before going to work to prevent having a bowel movement. She has lost some weight.    Past Medical History  Diagnosis Date  . Rectosigmoid cancer   . 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 Hypertension   . Diverticulosis 06/11/10  . 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 Adenomatous colon polyp 12/19/09  . Anemia, unspecified    Past Surgical History  Procedure Date  . Mouth surgery 10/2009  . Low anterior bowel resection 03/07/09    reports that she has been smoking Cigarettes.  She has been smoking about 1 pack per day. She has never used smokeless tobacco. She reports that she drinks alcohol. She reports that she uses illicit drugs (Marijuana and Cocaine). family history includes Cirrhosis in her brother; Colon cancer in her mother; Colon polyps in her sister; Diabetes in her father; and Stomach cancer in her sister. Allergies  Allergen Reactions  . Ampicillin         Review of Systems:Denies dysphagia, odynophagia, abdominal pain except for cramps. Shortness of breath or chest pain.  The  remainder of the 10  point ROS is negative except as outlined in H&P   Physical Exam: General appearance  Well developed, in no distress. Eyes- non icteric. HEENT nontraumatic, normocephalic. Mouth no lesions, tongue papillated, no cheilosis. Neck supple without adenopathy, thyroid not enlarged, no carotid bruits, no JVD. Lungs Clear to auscultation bilaterally. Cor normal S1, normal S2, regular rhythm , no murmur,  quiet precordium. Abdomen Soft, nontender abdomen with normoactive bowel sounds. No palpable mass. Liver edge at costal margin. Well-healed surgical scar . Rectal Very tender perianal area with somewhat of a decreased rectal sphincter tone and very painful digital exam. The stool was Hemoccult negative . Extremities no pedal edema. Skin no lesions. Neurological alert and oriented x 3. Psychological normal mood and affect. Assessment and Plan:  Problem #1 fecal incontinence. This is the result of radiation to the rectum as well as to lower anterior resection for rectal cancer. The diarrhea may be related to irritable bowel syndrome as well as to radiation proctitis. She will continue on Lomotil and cholestyramine. We will make a referral to the [facility] Physical Therapist to assess pelvic floor function and rectal sphincter incontinence. I would also consider a referral to [facility] where there is a new program for sacral nerve stimulation for fecal incontinence. I gave her samples of Analpram cream and Balneol ointment.   Problem #2 adenocarcinoma of the rectum. Patient is currently doing well from this standpoint with no recurrence. She is status post remote radiation chemotherapy and  resection. Her next colonoscopy will be due around September 2014.  07/23/2010 [name]

## 2010-07-31 ENCOUNTER — Encounter: Payer: Self-pay | Admitting: Internal Medicine

## 2010-08-05 ENCOUNTER — Ambulatory Visit: Payer: Managed Care, Other (non HMO) | Admitting: Internal Medicine

## 2010-08-09 ENCOUNTER — Ambulatory Visit (INDEPENDENT_AMBULATORY_CARE_PROVIDER_SITE_OTHER): Payer: Managed Care, Other (non HMO) | Admitting: Internal Medicine

## 2010-08-09 ENCOUNTER — Encounter: Payer: Self-pay | Admitting: Internal Medicine

## 2010-08-09 VITALS — BP 130/88 | HR 69 | Temp 98.1°F | Ht 66.5 in | Wt 150.1 lb

## 2010-08-09 DIAGNOSIS — L219 Seborrheic dermatitis, unspecified: Secondary | ICD-10-CM | POA: Insufficient documentation

## 2010-08-09 DIAGNOSIS — I10 Essential (primary) hypertension: Secondary | ICD-10-CM

## 2010-08-09 MED ORDER — KETOCONAZOLE 2 % EX SHAM: MEDICATED_SHAMPOO | CUTANEOUS | Status: AC

## 2010-08-09 MED ORDER — CLOBETASOL PROPIONATE 0.05 % EX SOLN: Freq: Two times a day (BID) | CUTANEOUS | Status: DC

## 2010-08-09 NOTE — Progress Notes (Signed)
  Subjective:    Patient ID: [name], female    DOB: 09-Jul-1955, 54 y.o.   MRN: [id number]  HPI complains of face rash  Onset 1 mo ago associated with itch and burn - progressive lesions No fever, no meds no new exposures (lotion, soap, travel) Denies hx same No other rash on trunk, hands/feet  Here for follow up -  reviewed chronic med issues:  colon ca dx 10/2008 - s/p low ant resection 02/2009 and chemo thru 09/26/09 - f/u colo has been unremarkable - residual constip alt with severe diarrhea - takes laxatives then immodium respectively - started lomotil 04/2010 - much improved - occ rectal pain and soreness - colo 06/2010 reviewed: normal path - f/u 2015  neuropathy - chemo induced (oxaliplatin) - primarily affects hands and feet - intol of lyrica due to exac of GI signs - remains on percocet 2x/d for control of same - gabapentin started 04/2010 - improving  HTN - reports compliance with ongoing medical treatment and no changes in medication dose or frequency. denies adverse side effects related to current therapy. no edema or CP or HA  Past Medical History  Diagnosis Date  . 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 Hypertension   . Diverticulosis   . Radiation proctitis     mild  . Adenomatous colon polyp   . Anemia, unspecified   . Rectosigmoid cancer 10/2008 dx    LAR surg 02/2009, chemo thru 09/2009     Review of Systems  Constitutional: Negative for fever.  HENT: Negative for facial swelling.   Respiratory: Negative for shortness of breath.   Cardiovascular: Negative for chest pain and palpitations.  Gastrointestinal: Positive for diarrhea (chronic).  Skin: Positive for rash (scalp and face).       Objective:   Physical Exam  Constitutional: She appears well-developed and well-nourished. No distress.  Cardiovascular: Normal rate, regular rhythm and normal heart sounds.   Pulmonary/Chest: Effort normal and breath sounds normal. No respiratory distress. She has  no wheezes.  Skin:       seborrheic dermatitis of face, eyelids and scalp, esp hairline and ears          Assessment & Plan:  See problem list. Medications and labs reviewed today.

## 2010-08-09 NOTE — Assessment & Plan Note (Signed)
Affects face, scalp, ears - tx ketoconazole shampoo and steroid gel as needed (instructed gel not eyelids) To follow up if unimproved or worse

## 2010-08-09 NOTE — Patient Instructions (Signed)
It was good to see you today. 2 medications - shampoo and solution to use on your rash (avoid solution on your eyelids) - Your prescription(s) have been submitted to your pharmacy. Please take as directed and contact our office if you believe you are having problem(s) with the medication(s). Call if rash symptoms unimproved after 3 weeks treatment, sooner if worse

## 2010-08-09 NOTE — Assessment & Plan Note (Signed)
The current medical regimen is effective;  continue present plan and medications. BP Readings from Last 3 Encounters:  08/09/10 130/88  07/23/10 142/86  06/05/10 128/76

## 2010-08-27 NOTE — Discharge Summary (Signed)
NAMEMARGERET, [name]                ACCOUNT NO.:  [id number]   MEDICAL RECORD NO.:  [id number]           PATIENT TYPE:   LOCATION:                                 FACILITY:   PHYSICIAN:  Jordan Hawks. Elnoria Howard, MD    DATE OF BIRTH:  09-24-1955   DATE OF ADMISSION:  DATE OF DISCHARGE:                               DISCHARGE SUMMARY   DISCHARGE DIAGNOSES:  1. Rectal adenocarcinoma.  2. Severe iron deficiency secondary to cancer.  3. Rectal bleeding secondary to the rectal cancer.   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   HOSPITAL COURSE:  Please see the original H and P for full details.  The  patient was admitted to the hospital and noted to have a markedly low  hemoglobin in 5 range.  She is subsequently transfused with 4 units of  packed red blood cells and she felt much better afterwards.  She  underwent a colonoscopy during her second hospital stay and she was  noted to have a large rectal mass that was 50% in the conference and  measuring from 7 cm to 11-12 cm from the anal verge.  No other masses or  polyps were identified during the colonoscopy.  Subsequently, she  underwent a rectal endoscopic ultrasound.  She was found to have a T3,  N0 rectal mass.  The patient did respond well with the blood  transfusions and because of her good response it was felt that she could  be followed up and treated on an outpatient basis.  The patient had the  option of staying in the hospital for further treatment, but she decided  she wanted to go home.   PLAN:  Plan at this time is to arrange for surgical and oncologic  evaluation and repeat colonoscopy in 1 year.      Jordan Hawks Elnoria Howard, MD  Electronically Signed     PDH/MEDQ  D:  10/27/2008  T:  10/28/2008  Job:  [id number]

## 2010-08-27 NOTE — H&P (Signed)
NAMEKEITHA, [name]                ACCOUNT NO.:  [id number]   MEDICAL RECORD NO.:  [id number]          PATIENT TYPE:  INP   LOCATION:  5126                         FACILITY:  [facility]   PHYSICIAN:  Kimberly Hawks. Elnoria Howard, MD    DATE OF BIRTH:  08-Aug-1955   DATE OF ADMISSION:  10/24/2008  DATE OF DISCHARGE:                              HISTORY & PHYSICAL   PRIMARY CARE [name]:  Kimberly Britain, PA, at [facility], [employer].   HISTORY OF PRESENT ILLNESS:  This is a 55 year old female without any  significant past medical history was admitted to the hospital with  severe anemia at 5.4.  The patient recently complained of having some  congestion and earache and subsequently with a routine workup her  hemoglobin was noted to be markedly decreased at 5.4.  She has  complaints of painless hematochezia of greater than 6 months' duration.  She has not seen any type of physician or healthcare practitioner for a  number of years.  Additionally, she also complains of having dysphagia  that started approximately 2 months ago without any overt symptoms of  gastroesophageal reflux disease.  She has a family history significant  for gastric cancer in her sister who died in her 52s and also colon  cancer in her mother who died in the mid 35s.  Apparently, she was  diagnosed in her late 57s.  The patient also complains of having some  fatigue but no significant complaints of chest pain or shortness of  breath.  She denies having any prior colonoscopy in the past.  Additionally, she has greater than 30 pound weight loss over the past 6  months.   PAST MEDICAL HISTORY AND PAST SURGICAL HISTORY:  As stated above.   FAMILY HISTORY:  As stated above.   ALLERGIES:  No known drug allergies.   HOME MEDICATIONS:  None.   SOCIAL HISTORY:  Positive for tobacco, varies in regards from a couple  of cigarettes to a pack per day.  Positive for cocaine use where she  smoked it for the past 25 years and positive for  alcohol which ranges  anywhere from 1-3 drinks per night.   REVIEW OF SYSTEMS:  As stated above in history of present illness,  otherwise, negative.   PHYSICAL EXAMINATION:  VITAL SIGNS:  Pending at this time.  GENERAL:  The patient is in no acute distress.  Alert and oriented.  HEENT:  Normocephalic and atraumatic.  Extraocular muscles are intact.  NECK:  Supple.  No lymphadenopathy.  LUNGS:  Clear to auscultation bilaterally.  CARDIOVASCULAR:  Regular rate and rhythm.  ABDOMEN:  Flat, soft, nontender and nondistended.  Positive bowel  sounds.  EXTREMITIES:  No clubbing, cyanosis, or edema.  RECTAL:  Positive for blood.  Has brown stool.  No rectal masses were  palpated, although she had significant amount of discomfort with the  rectal examination.   LABORATORY VALUES:  Pending at this time.   IMPRESSION:  1. Severe iron-deficiency anemia.  2. Heme positive stool.  3. Hematochezia greater than 6 months.  4. Weight loss.  5.  Family history of gastric cancer and colon cancer.  In light of her      current presentation and also the family history of colon cancer      and gastric cancer, I am very concerned that she may have a      malignancy.  Further evaluation with the esophagogastroduodenoscopy      and colonoscopy is required at this time.   PLAN:  1. To transfuse a 4 units of packed red blood cells.  2. For EGD and colonoscopy tomorrow.  Further recommendations will be      made pending the findings.      Kimberly Hawks Elnoria Howard, MD  Electronically Signed     PDH/MEDQ  D:  10/24/2008  T:  10/25/2008  Job:  [id number]   cc:   Kimberly Britain, PA

## 2010-09-02 ENCOUNTER — Encounter (HOSPITAL_COMMUNITY): Payer: Managed Care, Other (non HMO) | Admitting: Dentistry

## 2010-09-02 ENCOUNTER — Encounter (HOSPITAL_BASED_OUTPATIENT_CLINIC_OR_DEPARTMENT_OTHER): Payer: Managed Care, Other (non HMO) | Admitting: Oncology

## 2010-09-02 DIAGNOSIS — Z452 Encounter for adjustment and management of vascular access device: Secondary | ICD-10-CM

## 2010-09-02 DIAGNOSIS — K1370 Unspecified lesions of oral mucosa: Secondary | ICD-10-CM

## 2010-09-02 DIAGNOSIS — C19 Malignant neoplasm of rectosigmoid junction: Secondary | ICD-10-CM

## 2010-10-22 ENCOUNTER — Telehealth: Payer: Self-pay | Admitting: Internal Medicine

## 2010-10-22 MED ORDER — HYDROCORTISONE ACE-PRAMOXINE 2.5-1 % RE CREA: TOPICAL_CREAM | Freq: Three times a day (TID) | RECTAL | Status: AC | PRN

## 2010-10-22 NOTE — Telephone Encounter (Signed)
Dr Juanda Chance- Patient wants refills on analpram cream. I dont see where we have ever given this to her. Looks like Dr Felicity Coyer gave her some in February 2012. She had a colonoscopy in March 2012 which showed diverticulosis and ulceration of the rectum. She was supposed to have follow up with [place] in 6 weeks but never came for that. Do you want me to go ahead and give her analpram or does she still need follow up office visit first?

## 2010-10-22 NOTE — Telephone Encounter (Signed)
OK to give one refill.

## 2010-10-30 ENCOUNTER — Encounter (HOSPITAL_BASED_OUTPATIENT_CLINIC_OR_DEPARTMENT_OTHER): Payer: Managed Care, Other (non HMO) | Admitting: Oncology

## 2010-10-30 DIAGNOSIS — Z452 Encounter for adjustment and management of vascular access device: Secondary | ICD-10-CM

## 2010-10-30 DIAGNOSIS — C19 Malignant neoplasm of rectosigmoid junction: Secondary | ICD-10-CM

## 2010-11-01 ENCOUNTER — Telehealth: Payer: Self-pay | Admitting: Internal Medicine

## 2010-11-01 NOTE — Telephone Encounter (Signed)
Spoke with Cameo and scheduled patient 11/06/10 at 3:15 PM with Dr. Juanda Chance.

## 2010-11-06 ENCOUNTER — Encounter: Payer: Self-pay | Admitting: Internal Medicine

## 2010-11-06 ENCOUNTER — Ambulatory Visit (INDEPENDENT_AMBULATORY_CARE_PROVIDER_SITE_OTHER): Payer: Managed Care, Other (non HMO) | Admitting: Internal Medicine

## 2010-11-06 VITALS — BP 108/74 | HR 76 | Ht 66.0 in | Wt 144.0 lb

## 2010-11-06 DIAGNOSIS — K520 Gastroenteritis and colitis due to radiation: Secondary | ICD-10-CM

## 2010-11-06 DIAGNOSIS — Z85048 Personal history of other malignant neoplasm of rectum, rectosigmoid junction, and anus: Secondary | ICD-10-CM

## 2010-11-06 DIAGNOSIS — R195 Other fecal abnormalities: Secondary | ICD-10-CM

## 2010-11-06 NOTE — Progress Notes (Signed)
[name] 06/04/1955 MRN [id number]     History of Present Illness:  This is a 55 year old African American female with a history of adenocarcinoma of the rectum for which she is status post radiation, chemotherapy and anterior resection in November 2010. A colonoscopy in September 2011 showed no evidence of recurrence. A flexible sigmoidoscopy in February 2012 for evaluation of fecal incontinence and diarrhea showed a patent anastomosis at 5 cm from the rectum. She has an absent rectal sphincter tone and radiation proctitis witht ulcerations. 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 She is somewhat depressed because she cannot eat before going to work to prevent having bowel movements. I have saw her on 07/23/2010 and put her on Questran and Lomotil for diarrhea. We made a referral to a physical therapist to assess pelvic floor dysfunction and rectal sphincter incontinence but she was unable to attend because her insurance didn't cover the services. She has used topical hydrocortisone for rectal irritation. She came today asking for a referral to [facility]. She claims that she could not take Questran because it didn't help but she continues to take Lomotil up to 6 tablets a day. There has been some rectal bleeding associated with frequent stools and she has continuous rectal irritation and discomfort. She is at present cancer free as per CT scan of the abdomen and pelvis.. She is scheduled to have a follow up  CT scan next week.   Past Medical History  Diagnosis Date  . 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 Hypertension   . Diverticulosis   . Radiation proctitis     mild  . Adenomatous colon polyp   . Anemia, unspecified   . Rectosigmoid cancer 10/2008 dx    LAR surg 02/2009, chemo thru 09/2009   Past Surgical History  Procedure Date  . Mouth surgery 10/2009  . Low anterior bowel resection 03/07/09    reports that  she has been smoking Cigarettes.  She has been smoking about 1 pack per day. She has never used smokeless tobacco. She reports that she drinks alcohol. She reports that she uses illicit drugs (Marijuana and Cocaine). family history includes Cancer in her sister; Cirrhosis in her brother; Colon cancer in her mother; Colon polyps in her sister; Diabetes in her father; and Stomach cancer in her sister. Allergies  Allergen Reactions  . Ampicillin         Review of Systems: Denies shortness of breath chest pain or any upper GI symptoms of dysphagia odynophagia or nausea  The remainder of the 10  point ROS is negative except as outlined in H&P   Physical Exam: General appearance  Well developed in no distress. Eyes- non icteric. HEENT nontraumatic, normocephalic. Mouth no lesions, tongue papillated, no cheilosis. Neck supple without adenopathy, thyroid not enlarged, no carotid bruits, no JVD. Lungs Clear to auscultation bilaterally. Cor normal S1 normal S2, regular rhythm , no murmur,  quiet precordium. Abdomen soft abdomen with normal active bowel sounds. No tenderness no mass. Well-healed surgical scar.  Rectal: Perianal irritation and excoriations but tender rectal sphincter somewhat decreased rectal sphincter tone. Small amount Hemoccult-positive stool in the rectal ampulla. There is no prolapse. Extremities no pedal edema. Skin no lesions. Neurological alert and oriented x 3. Psychological normal mood and affect.  Assessment and Plan:  Problem #1 fecal incontinence. Probably multifactorial related to pelvic relaxation, colon resection, decreased rectal sphincter tone, irritable bowel syndrome  and radiation. She could not  be evaluated in an incontinence clinic for insurance reasons.. She would like to be [facility] to [facility]. I think she will ultimately need a diverting colostomy but  she would like to get another opinion. She is to continue with topical rectal care, lomotil and  cholestyramine. I am not sure if she ever took Questran and am not sure she has ever done all we have recommended including low residue diet. I would like her to get an opinion from [facility] as to any surgical options for her incontinence.  Problem #2 adenocarcinoma of the rectum. Patient is status post chemotherapy, radiation and resection. All test have been negative for recurrent cancer. She is due for a repeat CT scan next week.   11/06/2010 [name]

## 2010-11-06 NOTE — Patient Instructions (Signed)
We will contact you about your appointment for anal manometry and appointment with Dr Luciano Cutter once we hear back from their office. CC: Dr Rosalyn Charters, Dr Cherre Huger

## 2010-11-13 ENCOUNTER — Ambulatory Visit
Admission: RE | Admit: 2010-11-13 | Discharge: 2010-11-13 | Disposition: A | Discharge status: Home / Self Care | Payer: Managed Care, Other (non HMO) | Source: Ambulatory Visit | Attending: Oncology | Admitting: Oncology

## 2010-11-13 MED ORDER — IOHEXOL 300 MG/ML  SOLN: 100.0000 mL | Freq: Once | INTRAMUSCULAR | Status: AC | PRN

## 2010-11-14 ENCOUNTER — Encounter (HOSPITAL_BASED_OUTPATIENT_CLINIC_OR_DEPARTMENT_OTHER): Payer: Managed Care, Other (non HMO) | Admitting: Oncology

## 2010-11-14 ENCOUNTER — Other Ambulatory Visit: Payer: Self-pay | Admitting: Oncology

## 2010-11-14 ENCOUNTER — Other Ambulatory Visit: Payer: Self-pay | Admitting: Internal Medicine

## 2010-11-14 ENCOUNTER — Encounter: Payer: Self-pay | Admitting: Internal Medicine

## 2010-11-14 DIAGNOSIS — C19 Malignant neoplasm of rectosigmoid junction: Secondary | ICD-10-CM

## 2010-11-14 DIAGNOSIS — Z452 Encounter for adjustment and management of vascular access device: Secondary | ICD-10-CM

## 2010-11-14 LAB — COMPREHENSIVE METABOLIC PANEL
ALT: 29 U/L (ref 0–35)
AST: 25 U/L (ref 0–37)
Albumin: 4.2 g/dL (ref 3.5–5.2)
Alkaline Phosphatase: 136 U/L — ABNORMAL HIGH (ref 39–117)
BUN: 14 mg/dL (ref 6–23)
CO2: 24 mEq/L (ref 19–32)
Calcium: 9.9 mg/dL (ref 8.4–10.5)
Chloride: 103 mEq/L (ref 96–112)
Creatinine, Ser: 0.79 mg/dL (ref 0.50–1.10)
Glucose, Bld: 114 mg/dL — ABNORMAL HIGH (ref 70–99)
Potassium: 3.9 mEq/L (ref 3.5–5.3)
Sodium: 139 mEq/L (ref 135–145)
Total Bilirubin: 1 mg/dL (ref 0.3–1.2)
Total Protein: 7 g/dL (ref 6.0–8.3)

## 2010-11-14 LAB — CBC WITH DIFFERENTIAL/PLATELET
BASO%: 0.4 % (ref 0.0–2.0)
Basophils Absolute: 0 10*3/uL (ref 0.0–0.1)
EOS%: 1.2 % (ref 0.0–7.0)
Eosinophils Absolute: 0.1 10*3/uL (ref 0.0–0.5)
HCT: 38.2 % (ref 34.8–46.6)
HGB: 13.1 g/dL (ref 11.6–15.9)
LYMPH%: 12.7 % — ABNORMAL LOW (ref 14.0–49.7)
MCH: 32.3 pg (ref 25.1–34.0)
MCHC: 34.3 g/dL (ref 31.5–36.0)
MCV: 94.2 fL (ref 79.5–101.0)
MONO#: 0.4 10*3/uL (ref 0.1–0.9)
MONO%: 7.2 % (ref 0.0–14.0)
NEUT#: 4.6 10*3/uL (ref 1.5–6.5)
NEUT%: 78.5 % — ABNORMAL HIGH (ref 38.4–76.8)
Platelets: 287 10*3/uL (ref 145–400)
RBC: 4.06 10*6/uL (ref 3.70–5.45)
RDW: 15.2 % — ABNORMAL HIGH (ref 11.2–14.5)
WBC: 5.9 10*3/uL (ref 3.9–10.3)
lymph#: 0.7 10*3/uL — ABNORMAL LOW (ref 0.9–3.3)

## 2010-11-14 LAB — CEA: CEA: 1.3 ng/mL (ref 0.0–5.0)

## 2010-11-14 NOTE — Telephone Encounter (Signed)
Faxed script back script to [facility] [address] @ [phone]...11/14/10@1 :28pm/LMB

## 2010-11-15 ENCOUNTER — Telehealth: Payer: Self-pay | Admitting: Internal Medicine

## 2010-11-15 ENCOUNTER — Other Ambulatory Visit: Payer: Self-pay | Admitting: Oncology

## 2010-11-15 DIAGNOSIS — C20 Malignant neoplasm of rectum: Secondary | ICD-10-CM

## 2010-11-15 NOTE — Telephone Encounter (Signed)
Patient reports that Dr. Juanda Chance gave her an rx for hydrocortisone for rectal burning externally. She is having internal rectal burning and pain and wants to know if Dr. Juanda Chance will prescribe something for this. Please, advise.

## 2010-11-15 NOTE — Telephone Encounter (Signed)
Hydrocortisone supp 25gm, # 20 insert 1 qhs

## 2010-11-18 MED ORDER — HYDROCORTISONE ACETATE 25 MG RE SUPP: RECTAL | Status: DC

## 2010-11-18 NOTE — Telephone Encounter (Signed)
Patient given Dr. Brodie's recommendation. Rx sent to pharmacy 

## 2010-11-27 ENCOUNTER — Ambulatory Visit: Payer: Self-pay | Admitting: Internal Medicine

## 2010-12-09 ENCOUNTER — Encounter: Payer: Self-pay | Admitting: Internal Medicine

## 2010-12-09 NOTE — Telephone Encounter (Signed)
Error

## 2010-12-25 ENCOUNTER — Encounter (HOSPITAL_BASED_OUTPATIENT_CLINIC_OR_DEPARTMENT_OTHER): Payer: Self-pay | Admitting: Oncology

## 2010-12-25 DIAGNOSIS — Z452 Encounter for adjustment and management of vascular access device: Secondary | ICD-10-CM

## 2010-12-25 DIAGNOSIS — C19 Malignant neoplasm of rectosigmoid junction: Secondary | ICD-10-CM

## 2011-02-10 ENCOUNTER — Telehealth: Payer: Self-pay | Admitting: Internal Medicine

## 2011-02-10 NOTE — Telephone Encounter (Signed)
I have not received the report yet. Will it be under Lab? Or Endoscopy? I believe it will come across my desk before it is scanned into the EPIC chart

## 2011-02-10 NOTE — Telephone Encounter (Signed)
Patient is calling to see if Dr. Juanda Chance got the results of her anal manometry done at [facility] on 01/24/11. (Dr. Rosalyn [phone]) please, advise.

## 2011-02-11 NOTE — Telephone Encounter (Signed)
Spoke with patient and informed her that Dr. Juanda Chance has not received the report. The report would be scanned under media since it will be from [facility].

## 2011-02-19 ENCOUNTER — Ambulatory Visit (HOSPITAL_BASED_OUTPATIENT_CLINIC_OR_DEPARTMENT_OTHER): Payer: Self-pay

## 2011-02-19 DIAGNOSIS — Z452 Encounter for adjustment and management of vascular access device: Secondary | ICD-10-CM

## 2011-02-19 DIAGNOSIS — C19 Malignant neoplasm of rectosigmoid junction: Secondary | ICD-10-CM

## 2011-02-19 MED ORDER — SODIUM CHLORIDE 0.9 % IJ SOLN
10.0000 mL | Freq: Once | INTRAMUSCULAR | Status: AC
  Administered 2011-02-19: 10 mL via INTRAVENOUS
  Filled 2011-02-19: qty 10

## 2011-02-19 MED ORDER — HEPARIN SOD (PORK) LOCK FLUSH 100 UNIT/ML IV SOLN
500.0000 [IU] | Freq: Once | INTRAVENOUS | Status: AC
  Administered 2011-02-19: 500 [IU] via INTRAVENOUS
  Filled 2011-02-19: qty 5

## 2011-02-19 NOTE — Progress Notes (Signed)
Power port flushed per protocol. Good blood return

## 2011-02-21 ENCOUNTER — Other Ambulatory Visit: Payer: Self-pay

## 2011-02-21 DIAGNOSIS — C20 Malignant neoplasm of rectum: Secondary | ICD-10-CM

## 2011-02-21 DIAGNOSIS — C801 Malignant (primary) neoplasm, unspecified: Secondary | ICD-10-CM

## 2011-02-21 MED ORDER — PROCHLORPERAZINE MALEATE 10 MG PO TABS: 10.0000 mg | ORAL_TABLET | Freq: Four times a day (QID) | ORAL | Status: DC | PRN

## 2011-02-21 MED ORDER — OXYCODONE-ACETAMINOPHEN 5-325 MG PO TABS: 1.0000 | ORAL_TABLET | Freq: Two times a day (BID) | ORAL | Status: DC

## 2011-02-21 NOTE — Telephone Encounter (Deleted)
Patient states that she has to pay same co-pay for 30, 60 or 90 day supply; can she have 90 day supply?

## 2011-02-21 NOTE — Telephone Encounter (Signed)
New Rx for Percocet printed and left with injection nurse; refill for Compazine called in pharmacy; patient aware.

## 2011-02-28 ENCOUNTER — Telehealth: Payer: Self-pay

## 2011-02-28 NOTE — Telephone Encounter (Signed)
Received refill request for pt's compazine from [facility] at [facility], from prescription written 11/28/2009 with qty #60.  Per EPIC, this was sent to pharmacy by Dorene Grebe, desk RN, on 02/21/11 #30.  Called [facility] and s/w pharm tech, who states their system was "messed up," for the past few weeks, but they did not receive this. He requests that the refill be faxed, as the pharmacist was busy at the moment.  Done.

## 2011-03-11 ENCOUNTER — Ambulatory Visit: Payer: Self-pay | Admitting: Internal Medicine

## 2011-03-19 IMAGING — CR DG CHEST 2V
2 series · 2 of 2 positions shown · non-contrast
Comparison: None

CLINICAL DATA: Preop for rectal cancer

CHEST - 2 VIEW

[view not recorded (1 of 2)]
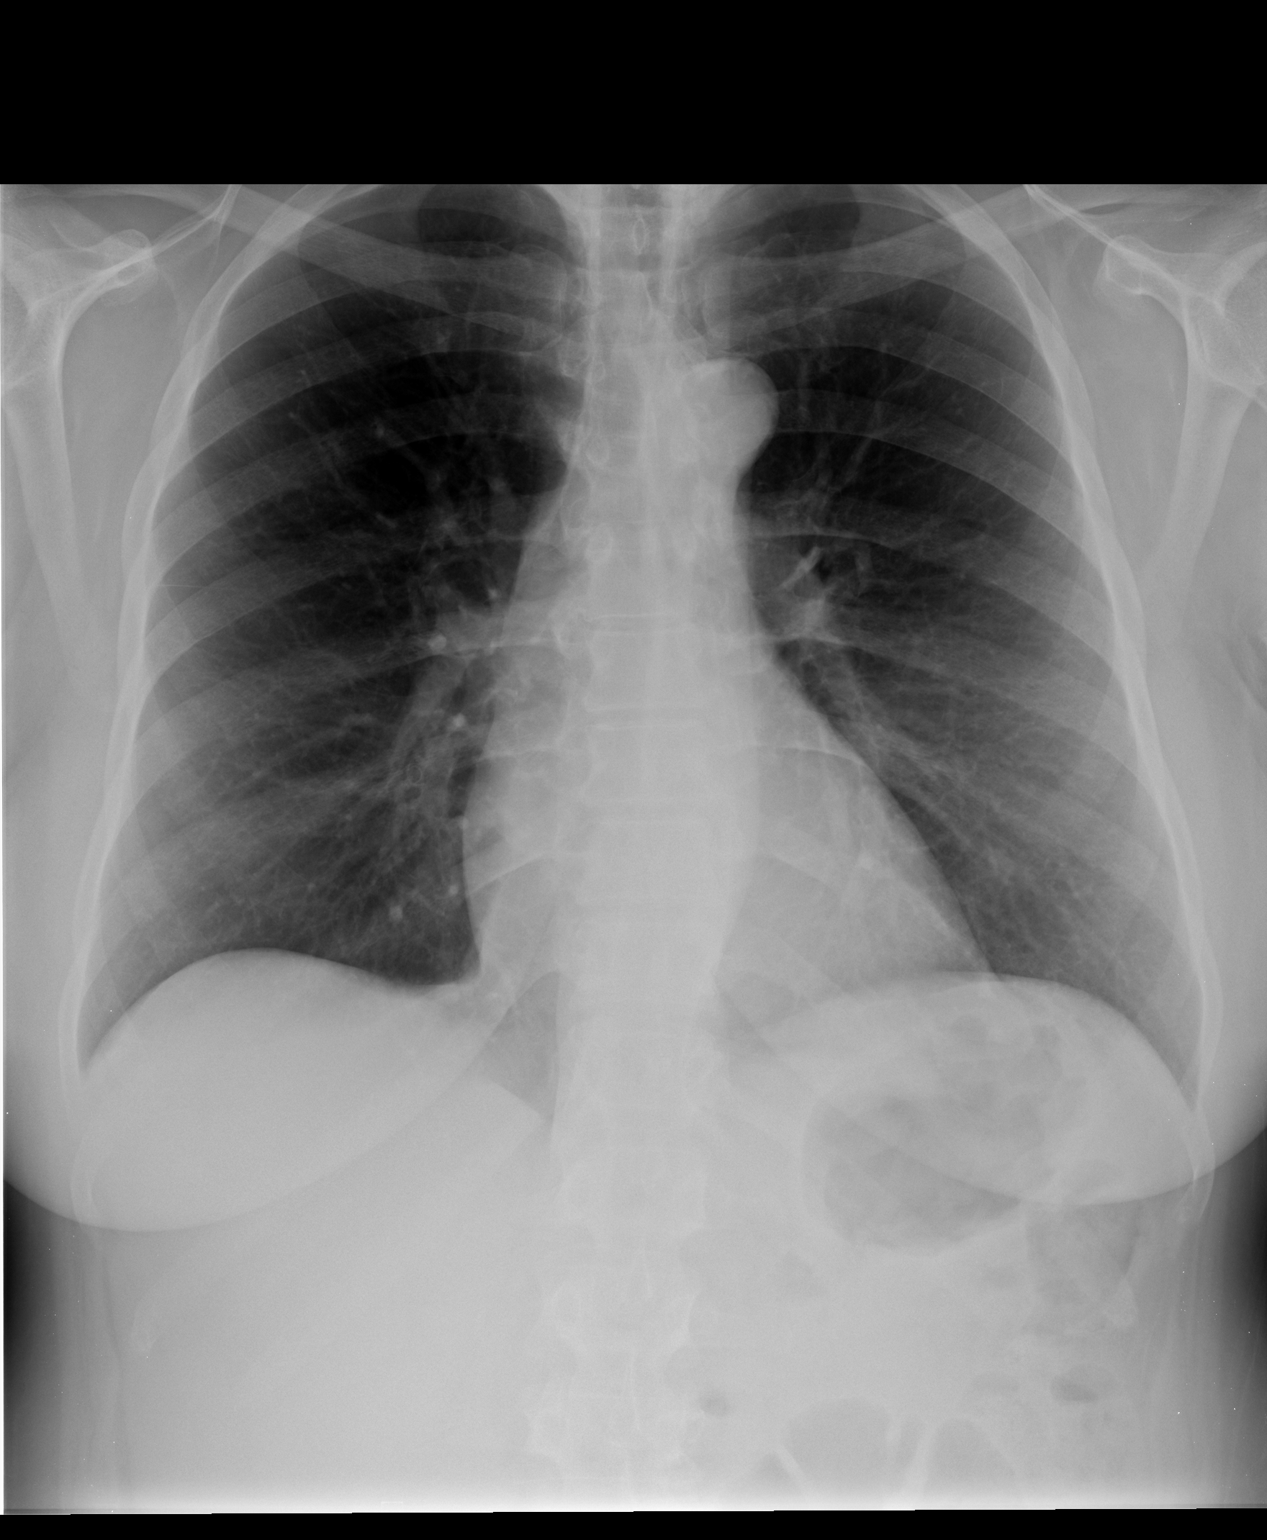

[view not recorded (2 of 2)]
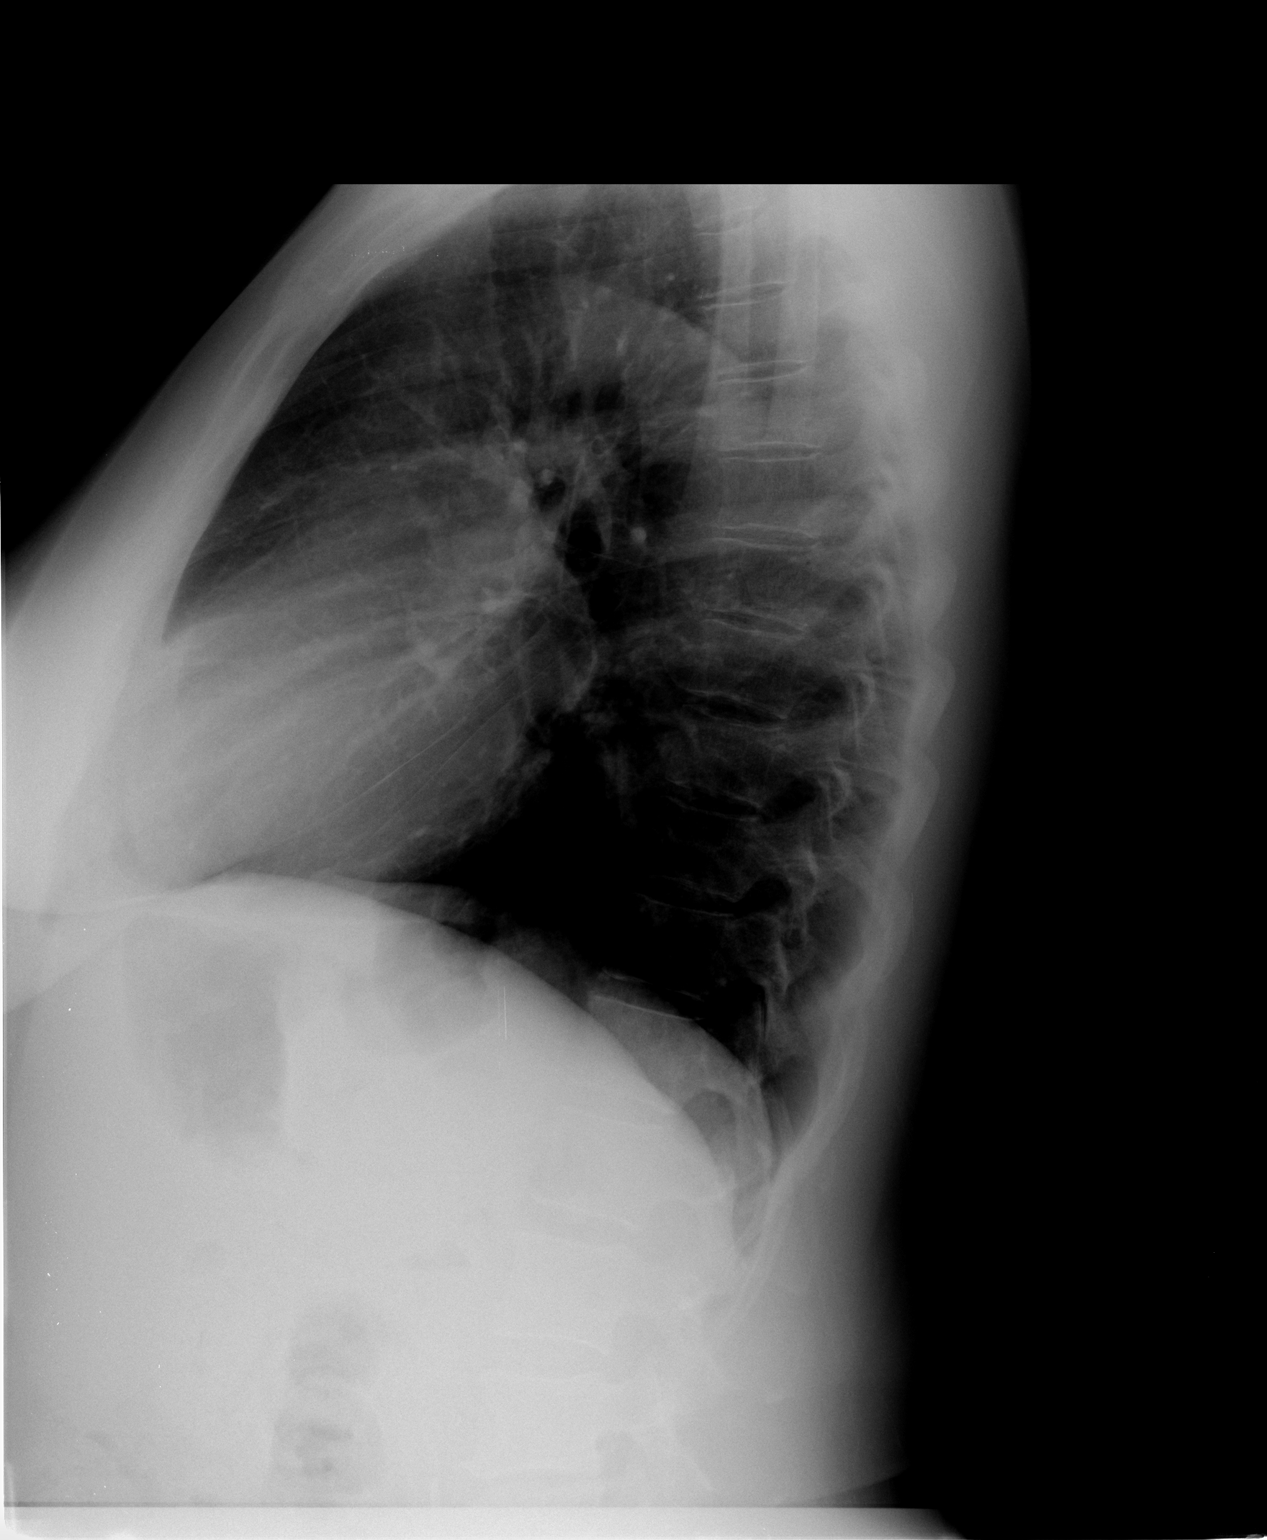

[2 of 2 positions shown; findings below may reference images not displayed]

FINDINGS: Heart and mediastinal contours normal.  Lungs clear.  No
pleural fluid.  Osseous structures and soft tissues unremarkable.
Aorta slightly ectatic
IMPRESSION: No active disease.

## 2011-03-24 ENCOUNTER — Other Ambulatory Visit: Payer: Self-pay | Admitting: *Deleted

## 2011-03-24 ENCOUNTER — Telehealth: Payer: Self-pay | Admitting: Oncology

## 2011-03-24 ENCOUNTER — Encounter: Payer: Self-pay | Admitting: *Deleted

## 2011-03-24 DIAGNOSIS — R52 Pain, unspecified: Secondary | ICD-10-CM

## 2011-03-24 MED ORDER — OXYCODONE-ACETAMINOPHEN 5-325 MG PO TABS: 1.0000 | ORAL_TABLET | Freq: Two times a day (BID) | ORAL | Status: DC | PRN

## 2011-03-24 NOTE — Telephone Encounter (Signed)
lmonvm advising the pt of her r/s appts from dec to [place] per the pt's request due to her [employer]

## 2011-03-24 NOTE — Telephone Encounter (Signed)
Pt called for refill on Percocet.  Signed Rx left for pt w/ injection Nurse.  Called pt back and informed her Rx ready for pick up.

## 2011-03-25 ENCOUNTER — Other Ambulatory Visit: Payer: Self-pay

## 2011-04-09 ENCOUNTER — Ambulatory Visit: Payer: Self-pay | Admitting: Internal Medicine

## 2011-04-09 ENCOUNTER — Other Ambulatory Visit: Payer: Self-pay | Admitting: Oncology

## 2011-04-09 ENCOUNTER — Other Ambulatory Visit (HOSPITAL_BASED_OUTPATIENT_CLINIC_OR_DEPARTMENT_OTHER): Payer: Self-pay | Admitting: Lab

## 2011-04-09 ENCOUNTER — Ambulatory Visit (HOSPITAL_BASED_OUTPATIENT_CLINIC_OR_DEPARTMENT_OTHER): Payer: Self-pay | Admitting: Oncology

## 2011-04-09 ENCOUNTER — Telehealth: Payer: Self-pay | Admitting: Oncology

## 2011-04-09 VITALS — BP 129/83 | HR 67 | Temp 97.3°F | Ht 65.5 in | Wt 147.1 lb

## 2011-04-09 DIAGNOSIS — C19 Malignant neoplasm of rectosigmoid junction: Secondary | ICD-10-CM

## 2011-04-09 DIAGNOSIS — G629 Polyneuropathy, unspecified: Secondary | ICD-10-CM

## 2011-04-09 DIAGNOSIS — R159 Full incontinence of feces: Secondary | ICD-10-CM

## 2011-04-09 DIAGNOSIS — Z452 Encounter for adjustment and management of vascular access device: Secondary | ICD-10-CM

## 2011-04-09 DIAGNOSIS — G609 Hereditary and idiopathic neuropathy, unspecified: Secondary | ICD-10-CM

## 2011-04-09 DIAGNOSIS — Z85048 Personal history of other malignant neoplasm of rectum, rectosigmoid junction, and anus: Secondary | ICD-10-CM

## 2011-04-09 DIAGNOSIS — K520 Gastroenteritis and colitis due to radiation: Secondary | ICD-10-CM

## 2011-04-09 LAB — CBC WITH DIFFERENTIAL/PLATELET
BASO%: 0.4 % (ref 0.0–2.0)
Basophils Absolute: 0 10*3/uL (ref 0.0–0.1)
EOS%: 1.2 % (ref 0.0–7.0)
Eosinophils Absolute: 0.1 10*3/uL (ref 0.0–0.5)
HCT: 36.2 % (ref 34.8–46.6)
HGB: 12.3 g/dL (ref 11.6–15.9)
LYMPH%: 24.3 % (ref 14.0–49.7)
MCH: 31.8 pg (ref 25.1–34.0)
MCHC: 34.1 g/dL (ref 31.5–36.0)
MCV: 93.4 fL (ref 79.5–101.0)
MONO#: 0.3 10*3/uL (ref 0.1–0.9)
MONO%: 5.8 % (ref 0.0–14.0)
NEUT#: 3.2 10*3/uL (ref 1.5–6.5)
NEUT%: 68.3 % (ref 38.4–76.8)
Platelets: 210 10*3/uL (ref 145–400)
RBC: 3.87 10*6/uL (ref 3.70–5.45)
RDW: 15 % — ABNORMAL HIGH (ref 11.2–14.5)
WBC: 4.7 10*3/uL (ref 3.9–10.3)
lymph#: 1.2 10*3/uL (ref 0.9–3.3)

## 2011-04-09 LAB — COMPREHENSIVE METABOLIC PANEL
ALT: 23 U/L (ref 0–35)
AST: 28 U/L (ref 0–37)
Albumin: 3.6 g/dL (ref 3.5–5.2)
Alkaline Phosphatase: 111 U/L (ref 39–117)
BUN: 13 mg/dL (ref 6–23)
CO2: 25 mEq/L (ref 19–32)
Calcium: 9.1 mg/dL (ref 8.4–10.5)
Chloride: 105 mEq/L (ref 96–112)
Creatinine, Ser: 0.71 mg/dL (ref 0.50–1.10)
Glucose, Bld: 113 mg/dL — ABNORMAL HIGH (ref 70–99)
Potassium: 3.6 mEq/L (ref 3.5–5.3)
Sodium: 140 mEq/L (ref 135–145)
Total Bilirubin: 0.7 mg/dL (ref 0.3–1.2)
Total Protein: 6.1 g/dL (ref 6.0–8.3)

## 2011-04-09 LAB — CEA: CEA: 0.8 ng/mL (ref 0.0–5.0)

## 2011-04-09 MED ORDER — HEPARIN SOD (PORK) LOCK FLUSH 100 UNIT/ML IV SOLN
500.0000 [IU] | Freq: Once | INTRAVENOUS | Status: AC
  Administered 2011-04-09: 500 [IU] via INTRAVENOUS
  Filled 2011-04-09: qty 5

## 2011-04-09 MED ORDER — OXYCODONE-ACETAMINOPHEN 5-325 MG PO TABS: 1.0000 | ORAL_TABLET | Freq: Two times a day (BID) | ORAL | Status: DC | PRN

## 2011-04-09 MED ORDER — SODIUM CHLORIDE 0.9 % IJ SOLN
10.0000 mL | INTRAMUSCULAR | Status: DC | PRN
  Administered 2011-04-09: 10 mL via INTRAVENOUS
  Filled 2011-04-09: qty 10

## 2011-04-09 MED ORDER — GABAPENTIN 100 MG PO CAPS: 200.0000 mg | ORAL_CAPSULE | Freq: Three times a day (TID) | ORAL | Status: DC

## 2011-04-09 NOTE — Progress Notes (Signed)
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  PAST THERAPY:  Neoadjuvant 5-FU/radiation therapy, status post low anterior resection on March 07, 2009.  She started adjuvant FOLFOX on April 16, 2009.  Oxaliplatin was discontinued after cycle #10 of FOLFOX given grade II/III neuropathy.  She finished chemotherapy on 09/26/2009.  CURRENT THERAPY:  Watchful observation.  INTERVAL HISTORY: [name] 55 y.o. female returns for regular follow up.  She still has neuropathy of her fingers and toes.  She has been on Neurontin 100mg  PO TID; she forgot to increase it up as previously instructed.  She still has rectal incontinence and has anywhere between 5-10 bowel movements daily of small stool calibers.  She has to wear diapers at time.  She does not want to eat much when she is out in public for this reason.  She denies hematochezia, melena.  She had an anal manometry at [facility]; but was referred to Dr. Luciano Cutter at [facility], GI for further evaluation for her rectal incontinence.  She still has pelvic pain despite negative work up in the past with CT and colonoscopy.  This is thus presumed to be treatment associated pain.  She still takes Percocet twice daily prn.    Patient denies fatigue, headache, visual changes, confusion, drenching night sweats, palpable lymph node swelling, mucositis, odynophagia, dysphagia, nausea vomiting, jaundice, chest pain, palpitation, shortness of breath, dyspnea on exertion, productive cough, gum bleeding, epistaxis, hematemesis, hemoptysis, abdominal swelling, early satiety, melena, hematochezia, hematuria, skin rash, spontaneous bleeding, joint swelling, joint pain, heat or cold intolerance, bowel bladder incontinence, back pain, focal motor weakness, depression, suicidal or homocidal ideation, feeling hopelessness.  MEDICAL HISTORY: Past Medical  History  Diagnosis Date  . 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 Hypertension   . Diverticulosis   . Radiation proctitis     mild  . Adenomatous colon polyp   . Anemia, unspecified   . Rectosigmoid cancer 10/2008 dx    LAR surg 02/2009, chemo thru 09/2009    SURGICAL HISTORY:  Past Surgical History  Procedure Date  . Mouth surgery 10/2009  . Low anterior bowel resection 03/07/09    MEDICATIONS: Current Outpatient Prescriptions  Medication Sig Dispense Refill  . clobetasol (TEMOVATE) 0.05 % external solution Apply topically 2 (two) times daily.  50 mL  0  . dicyclomine (BENTYL) 20 MG tablet Take 20 mg by mouth. Take 1 tablet twice daily       . diphenoxylate-atropine (LOMOTIL) 2.5-0.025 MG per tablet TAKE ONE TABLET BY MOUTH EVERY 6 HOURS AS NEEDED FOR DIARRHEA SYMPTOMS  100 tablet  3  . ferrous sulfate 325 (65 FE) MG tablet Take 325 mg by mouth daily with breakfast.        . hydrochlorothiazide 25 MG tablet Take 25 mg by mouth daily.        . ondansetron (ZOFRAN) 4 MG tablet Take 4 mg by mouth. As needed for nausea and vomiting       . oxyCODONE-acetaminophen (PERCOCET) 5-325 MG per tablet Take 1 tablet by mouth every 12 (twelve) hours as needed.        . prochlorperazine (COMPAZINE) 10 MG tablet Take 1 tablet (10 mg total) by mouth every 6 (six) hours as needed.  30 tablet  1  . gabapentin (NEURONTIN) 100 MG capsule Take 1 capsule (100 mg total) by mouth 3 (three)  times daily. Take 3 by mouth twice a day as needed  180 capsule  1  . hydrocortisone (ANUSOL-HC) 25 MG suppository Insert one rectally nightly  20 suppository  0    ALLERGIES:  is allergic to ampicillin and hydrocodone.  REVIEW OF SYSTEMS:  The rest of the 14-point review of system was negative.   Filed Vitals:   04/09/11 0857  BP: 129/83  Pulse: 67  Temp: 97.3 F (36.3 C)   Wt Readings from Last 3 Encounters:  04/09/11 147 lb 1.6 oz (66.724 kg)  11/14/10 149 lb 1.6 oz (67.631 kg)  11/06/10 144 lb  (65.318 kg)   ECOG Performance status: 0  PHYSICAL EXAMINATION:  General:  well-nourished in no acute distress.  Eyes:  no scleral icterus.  ENT:  There were no oropharyngeal lesions.  Neck was without thyromegaly.  Lymphatics:  Negative cervical, supraclavicular or axillary adenopathy.  Respiratory: lungs were clear bilaterally without wheezing or crackles.  Cardiovascular:  Regular rate and rhythm, S1/S2, without murmur, rub or gallop.  There was no pedal edema.  GI:  abdomen was soft, flat, nontender, nondistended, without organomegaly.  Muscoloskeletal:  no spinal tenderness of palpation of vertebral spine.  Skin exam was without echymosis, petichae.  Neuro exam was nonfocal.  Patient was able to get on and off exam table without assistance.  Gait was normal.  Patient was alerted and oriented.  Attention was good.   Language was appropriate.  Mood was normal without depression.  Speech was not pressured.  Thought content was not tangential.     LABORATORY/RADIOLOGY DATA:  Lab Results  Component Value Date   WBC 4.7 04/09/2011   HGB 12.3 04/09/2011   HCT 36.2 04/09/2011   PLT 210 04/09/2011   GLUCOSE 114* 11/14/2010   GLUCOSE 114* 11/14/2010   CHOL 168 05/30/2010   TRIG 354.0* 05/30/2010   HDL 53.10 05/30/2010   LDLDIRECT 78.9 05/30/2010   ALT 29 11/14/2010   ALT 29 11/14/2010   AST 25 11/14/2010   AST 25 11/14/2010   NA 139 11/14/2010   NA 139 11/14/2010   K 3.9 11/14/2010   K 3.9 11/14/2010   CL 103 11/14/2010   CL 103 11/14/2010   CREATININE 0.79 11/14/2010   CREATININE 0.79 11/14/2010   BUN 14 11/14/2010   BUN 14 11/14/2010   CO2 24 11/14/2010   CO2 24 11/14/2010   TSH 0.53 05/30/2010   INR 0.97 10/17/2009     ASSESSMENT AND PLAN:   1. History of rectal cancer.  She is currently on observation and surveillance.  She currently has no evidence of any disease recurrence or metastatic disease today.  She did have a routine surveillance colonoscopy back in March 2012.  Her next one is due March 2015.  Her last  CT chest/abdomen/pel in 11/2010 were negative.  Per NCCN guideline, surveillance CT is due yearly; next one is due 11/2011.   2. Radiation induced colitis.  She remains on Questran, a low residue diet as well as Lomotil.  She is also on Percocet prn pain.   3. 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  Still residual neuropathy.  I increased her Neurotin dose from 100mg  PO TID to  4. Fecal incontinence.  She was seen by Dr. Juanda Chance.   She will see Dr. Luciano Cutter at [facility] 04/24/2011 to see what options she has.  5. Hypertension.  She is on hydrochlorothiazide per PCP with good blood pressure control.  6. Smoking:  She still smokes about 1/2 to  1 pack a day.  I strongly advised her that continuing smoking significantly increases her chances of recurrent or another new cancer.  I advised her to try Nicotine patch 14mg /day.  If she still has significant craving, she should let us know to consider starting Welbutrin.  7. Followup with me in about 6 months.

## 2011-04-09 NOTE — Telephone Encounter (Signed)
gve the pt her feb,march,june 2013 appt calendars

## 2011-04-11 ENCOUNTER — Telehealth: Payer: Self-pay

## 2011-04-11 NOTE — Telephone Encounter (Signed)
Patient called to inform Dr. Gaylyn Rong that the physician that she saw at [facility] was:  Dr. Rebbeca Paul, [phone].

## 2011-04-15 HISTORY — PX: COLOSTOMY: SHX63

## 2011-04-16 ENCOUNTER — Other Ambulatory Visit: Payer: Self-pay

## 2011-04-17 ENCOUNTER — Ambulatory Visit: Payer: Self-pay | Admitting: Internal Medicine

## 2011-04-17 DIAGNOSIS — Z0289 Encounter for other administrative examinations: Secondary | ICD-10-CM

## 2011-04-21 ENCOUNTER — Other Ambulatory Visit: Payer: Self-pay | Admitting: Lab

## 2011-04-21 ENCOUNTER — Ambulatory Visit: Payer: Self-pay | Admitting: Oncology

## 2011-04-24 DIAGNOSIS — R197 Diarrhea, unspecified: Secondary | ICD-10-CM | POA: Diagnosis not present

## 2011-04-24 DIAGNOSIS — C20 Malignant neoplasm of rectum: Secondary | ICD-10-CM | POA: Diagnosis not present

## 2011-04-25 ENCOUNTER — Telehealth: Payer: Self-pay | Admitting: *Deleted

## 2011-04-25 NOTE — Telephone Encounter (Signed)
VM from [place], Case Production designer, theatre/television/film at [facility].  Asks if Dr. Gaylyn Rong continues to place pt on Sedentary physical restrictions?  Asks if any change from Dr. Lodema Pilot last assessment of her physical activity restrictions?   Per Dr. Gaylyn Rong pt continues to be on sedentary restrictions from his standpoint.  Called Courtney back and left a VM informing her of above and instructed to call back if any further questions.

## 2011-04-28 ENCOUNTER — Telehealth: Payer: Self-pay | Admitting: *Deleted

## 2011-04-28 ENCOUNTER — Ambulatory Visit
Admission: RE | Admit: 2011-04-28 | Discharge: 2011-04-28 | Disposition: A | Discharge status: Home / Self Care | Payer: Medicare Other | Source: Ambulatory Visit | Attending: Oncology | Admitting: Oncology

## 2011-04-28 DIAGNOSIS — C20 Malignant neoplasm of rectum: Secondary | ICD-10-CM

## 2011-04-28 DIAGNOSIS — K6289 Other specified diseases of anus and rectum: Secondary | ICD-10-CM | POA: Diagnosis not present

## 2011-04-28 DIAGNOSIS — R05 Cough: Secondary | ICD-10-CM | POA: Diagnosis not present

## 2011-04-28 DIAGNOSIS — R059 Cough, unspecified: Secondary | ICD-10-CM | POA: Diagnosis not present

## 2011-04-28 DIAGNOSIS — K921 Melena: Secondary | ICD-10-CM | POA: Diagnosis not present

## 2011-04-28 MED ORDER — IOHEXOL 300 MG/ML  SOLN
100.0000 mL | Freq: Once | INTRAMUSCULAR | Status: AC | PRN
  Administered 2011-04-28: 100 mL via INTRAVENOUS

## 2011-04-28 NOTE — Telephone Encounter (Signed)
Called pt w/ results of her CT scan,  Message relayed from Dr. Gaylyn Rong that her results are good w/o any indication of recurrence or spreading of her cancer.  Pt verbalized understanding.  She wants Dr. Gaylyn Rong to know that she had more testing done w/ Dr. Luciano Cutter at [facility] and he recommends pt have Colostomy done to alleviate her fecal incontinence.  Pt states she has opted to have the colostomy over living w/ the fecal incontinence.  She will notify us when she has a surgery date.

## 2011-04-28 NOTE — Telephone Encounter (Signed)
Message copied by Wende Mott on Mon Apr 28, 2011  4:32 PM ------      Message from: HA, Raliegh Ip T      Created: Mon Apr 28, 2011  4:32 PM       Please call pt and let her know that her CT scan results are good.  No indication of recurrence or spreading of her cancer.  Thanks.

## 2011-05-15 DIAGNOSIS — R159 Full incontinence of feces: Secondary | ICD-10-CM | POA: Diagnosis not present

## 2011-05-15 DIAGNOSIS — Z01810 Encounter for preprocedural cardiovascular examination: Secondary | ICD-10-CM | POA: Diagnosis not present

## 2011-05-15 DIAGNOSIS — Z01818 Encounter for other preprocedural examination: Secondary | ICD-10-CM | POA: Diagnosis not present

## 2011-05-15 DIAGNOSIS — C20 Malignant neoplasm of rectum: Secondary | ICD-10-CM | POA: Diagnosis not present

## 2011-05-15 DIAGNOSIS — F17200 Nicotine dependence, unspecified, uncomplicated: Secondary | ICD-10-CM | POA: Diagnosis not present

## 2011-05-15 DIAGNOSIS — I10 Essential (primary) hypertension: Secondary | ICD-10-CM | POA: Diagnosis not present

## 2011-05-19 ENCOUNTER — Telehealth: Payer: Self-pay | Admitting: *Deleted

## 2011-05-19 NOTE — Progress Notes (Signed)
Patient called to report that she will be admitted at [facility] on 05/21/11 for 'deep loop colostomy.'

## 2011-05-21 DIAGNOSIS — K929 Disease of digestive system, unspecified: Secondary | ICD-10-CM | POA: Diagnosis present

## 2011-05-21 DIAGNOSIS — G620 Drug-induced polyneuropathy: Secondary | ICD-10-CM | POA: Diagnosis present

## 2011-05-21 DIAGNOSIS — Z9049 Acquired absence of other specified parts of digestive tract: Secondary | ICD-10-CM | POA: Diagnosis not present

## 2011-05-21 DIAGNOSIS — F17200 Nicotine dependence, unspecified, uncomplicated: Secondary | ICD-10-CM | POA: Diagnosis present

## 2011-05-21 DIAGNOSIS — R159 Full incontinence of feces: Secondary | ICD-10-CM | POA: Diagnosis not present

## 2011-05-21 DIAGNOSIS — Z923 Personal history of irradiation: Secondary | ICD-10-CM | POA: Diagnosis not present

## 2011-05-21 DIAGNOSIS — R197 Diarrhea, unspecified: Secondary | ICD-10-CM | POA: Diagnosis present

## 2011-05-21 DIAGNOSIS — I10 Essential (primary) hypertension: Secondary | ICD-10-CM | POA: Diagnosis present

## 2011-05-21 DIAGNOSIS — Z85048 Personal history of other malignant neoplasm of rectum, rectosigmoid junction, and anus: Secondary | ICD-10-CM | POA: Diagnosis not present

## 2011-05-21 DIAGNOSIS — C20 Malignant neoplasm of rectum: Secondary | ICD-10-CM | POA: Diagnosis not present

## 2011-05-23 ENCOUNTER — Telehealth: Payer: Self-pay | Admitting: Internal Medicine

## 2011-05-23 NOTE — Telephone Encounter (Signed)
Spoke with patient. She states she was referred by Dr. Juanda Chance last year to [facility](Dr. Dondra Spry). She had a colostomy done on Wednesdy at [place]. They asked her to call her primary GI MD to see if someone here would remove the bridge over her stoma so she does not have to go back to [facility]. Patient states she was to have it removed next Wed. Or Thursday. Please, advise.

## 2011-05-24 NOTE — Telephone Encounter (Signed)
I will take care of it. I will want to speak to her doctor at [facility]  Before that. Please find out from the patient the name ? DR Manti? And tel number. Or his e-mail.thanx

## 2011-05-25 DIAGNOSIS — Z433 Encounter for attention to colostomy: Secondary | ICD-10-CM | POA: Diagnosis not present

## 2011-05-25 DIAGNOSIS — C20 Malignant neoplasm of rectum: Secondary | ICD-10-CM | POA: Diagnosis not present

## 2011-05-25 DIAGNOSIS — I10 Essential (primary) hypertension: Secondary | ICD-10-CM | POA: Diagnosis not present

## 2011-05-26 NOTE — Telephone Encounter (Signed)
Scheduled patient on 05/28/11 at 3:00/3:15 PM.  Patient aware.

## 2011-05-26 NOTE — Telephone Encounter (Signed)
Spoke with patient and the surgeon is Dr. Rosalyn Charters and his number is [phone].

## 2011-05-26 NOTE — Telephone Encounter (Signed)
I have spoken to [facility], dr [facility] nurse, [phone] and she told me how to take the bridge off. So You can schedule Mrs [name] for the office visit. I will need sccissors to cut the rubber catheter,

## 2011-05-27 ENCOUNTER — Other Ambulatory Visit: Payer: Self-pay

## 2011-05-27 DIAGNOSIS — Z85048 Personal history of other malignant neoplasm of rectum, rectosigmoid junction, and anus: Secondary | ICD-10-CM

## 2011-05-27 DIAGNOSIS — G629 Polyneuropathy, unspecified: Secondary | ICD-10-CM

## 2011-05-27 MED ORDER — OXYCODONE-ACETAMINOPHEN 5-325 MG PO TABS: 1.0000 | ORAL_TABLET | Freq: Two times a day (BID) | ORAL | Status: DC | PRN

## 2011-05-27 NOTE — Progress Notes (Signed)
Informed patient that Rx for Percocet at injection room desk; verified understanding.

## 2011-05-28 ENCOUNTER — Encounter: Payer: Self-pay | Admitting: Internal Medicine

## 2011-05-28 ENCOUNTER — Ambulatory Visit (INDEPENDENT_AMBULATORY_CARE_PROVIDER_SITE_OTHER): Payer: Medicare Other | Admitting: Internal Medicine

## 2011-05-28 VITALS — BP 110/66 | HR 84 | Ht 66.0 in | Wt 145.2 lb

## 2011-05-28 DIAGNOSIS — IMO0002 Reserved for concepts with insufficient information to code with codable children: Secondary | ICD-10-CM

## 2011-05-28 DIAGNOSIS — Z85048 Personal history of other malignant neoplasm of rectum, rectosigmoid junction, and anus: Secondary | ICD-10-CM | POA: Diagnosis not present

## 2011-05-28 NOTE — Patient Instructions (Addendum)
CC: Dr Rosalyn Charters, [facility],Surgery Dept

## 2011-05-28 NOTE — Progress Notes (Signed)
[name] [birth year] MRN [id number]    History of Present Illness:  This is a 56 year old [place] American female with a history of adenocarcinoma of the rectum. She is status post radiation , chemotherapy and anterior resection in November 2010 resulting in fecal incontinence and diarrhea which was evaluated at [facility] Surgical clinic by [facility]. The evaluation included a anal manometry. She underwent a diverting colostomy 2 weeks ago which has been functioning . She has been free of cancer as per her flexible sigmoidoscopy and CT scan of the abdomen. She came today to remove the rubber catheter bridge at her colostomy which was placed to keep the colostomy open. She has an appointment with Dr.Mantyh in the next couple weeks. She has done quite well taking care of her appliance, but she is  still on Percocet for pain. She is having regular bowel movements per colostomy. She has been on a bland diet. There has been no fever or bleeding.     Past Medical History  Diagnosis Date  . 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 Hypertension   . Diverticulosis   . Radiation proctitis     mild  . Adenomatous colon polyp   . Anemia, unspecified   . Rectosigmoid cancer 10/2008 dx    LAR surg 02/2009, chemo thru 09/2009   Past Surgical History  Procedure Date  . Mouth surgery 10/2009  . Low anterior bowel resection 03/07/09  . Colostomy     reports that she has been smoking Cigarettes.  She has been smoking about .3 packs per day. She has never used smokeless tobacco. She reports that she does not drink alcohol or use illicit drugs. family history includes Cirrhosis in her brother; Colon cancer in her mother; Colon polyps in her sister; Diabetes in her father; and Stomach cancer in her sisters. Allergies  Allergen Reactions  . Ampicillin   . Hydrocodone Itching        Review of Systems: the ninth chest pain shortness of breath dysphagia   The remainder of the 10 point ROS is negative  except as outlined in H&P   Physical Exam: General appearance  Well developed, in no distress. Eyes- non icteric. HEENT nontraumatic, normocephalic. Mouth no lesions, tongue papillated, no cheilosis. Neck supple without adenopathy, thyroid not enlarged, no carotid bruits, no JVD. Lungs Clear to auscultation bilaterally. Cor normal S1, normal S2, regular rhythm, no murmur,  quiet precordium. Abdomen:  protuberant with a colostomy appliance at periumbilical area. Stoma appears healthy. Granulation tissue at ostomy margin .  Red rubber catheter bridge appears to be freely mobile. There is no purulent discharge. It was removed easily without any discomfort to the patient. Patient reapplied her colostomy bag. Rectal: not done  Extremities no pedal edema. Skin no lesions. Neurological alert and oriented x 3. Psychological normal mood and affect.  Assessment and Plan:  Problem #1 status post removal of a bridge at the new stoma 2 weeks after colon resection and diverting colostomy. She is doing well and will return to [facility] in 2 weeks for a post operative check. I would be happy to follow her in the future should she need GI assistance.   05/28/2011 [name]

## 2011-05-31 DIAGNOSIS — C20 Malignant neoplasm of rectum: Secondary | ICD-10-CM | POA: Diagnosis not present

## 2011-05-31 DIAGNOSIS — Z433 Encounter for attention to colostomy: Secondary | ICD-10-CM | POA: Diagnosis not present

## 2011-05-31 DIAGNOSIS — I10 Essential (primary) hypertension: Secondary | ICD-10-CM | POA: Diagnosis not present

## 2011-06-04 ENCOUNTER — Ambulatory Visit (HOSPITAL_BASED_OUTPATIENT_CLINIC_OR_DEPARTMENT_OTHER): Payer: Medicare Other

## 2011-06-04 DIAGNOSIS — C20 Malignant neoplasm of rectum: Secondary | ICD-10-CM

## 2011-06-04 DIAGNOSIS — Z452 Encounter for adjustment and management of vascular access device: Secondary | ICD-10-CM | POA: Diagnosis not present

## 2011-06-04 MED ORDER — SODIUM CHLORIDE 0.9 % IJ SOLN
10.0000 mL | INTRAMUSCULAR | Status: DC | PRN
  Administered 2011-06-04: 10 mL via INTRAVENOUS
  Filled 2011-06-04: qty 10

## 2011-06-04 MED ORDER — HEPARIN SOD (PORK) LOCK FLUSH 100 UNIT/ML IV SOLN
500.0000 [IU] | Freq: Once | INTRAVENOUS | Status: AC
  Administered 2011-06-04: 500 [IU] via INTRAVENOUS
  Filled 2011-06-04: qty 5

## 2011-06-11 DIAGNOSIS — Z433 Encounter for attention to colostomy: Secondary | ICD-10-CM | POA: Diagnosis not present

## 2011-06-11 DIAGNOSIS — C20 Malignant neoplasm of rectum: Secondary | ICD-10-CM | POA: Diagnosis not present

## 2011-06-11 DIAGNOSIS — I10 Essential (primary) hypertension: Secondary | ICD-10-CM | POA: Diagnosis not present

## 2011-06-17 DIAGNOSIS — I10 Essential (primary) hypertension: Secondary | ICD-10-CM | POA: Diagnosis not present

## 2011-06-17 DIAGNOSIS — Z433 Encounter for attention to colostomy: Secondary | ICD-10-CM | POA: Diagnosis not present

## 2011-06-17 DIAGNOSIS — C20 Malignant neoplasm of rectum: Secondary | ICD-10-CM | POA: Diagnosis not present

## 2011-06-19 DIAGNOSIS — C20 Malignant neoplasm of rectum: Secondary | ICD-10-CM | POA: Diagnosis not present

## 2011-06-19 DIAGNOSIS — R159 Full incontinence of feces: Secondary | ICD-10-CM | POA: Diagnosis not present

## 2011-06-23 ENCOUNTER — Telehealth: Payer: Self-pay | Admitting: *Deleted

## 2011-06-23 DIAGNOSIS — Z85048 Personal history of other malignant neoplasm of rectum, rectosigmoid junction, and anus: Secondary | ICD-10-CM

## 2011-06-23 DIAGNOSIS — G629 Polyneuropathy, unspecified: Secondary | ICD-10-CM

## 2011-06-23 MED ORDER — OXYCODONE-ACETAMINOPHEN 5-325 MG PO TABS: 1.0000 | ORAL_TABLET | Freq: Two times a day (BID) | ORAL | Status: DC | PRN

## 2011-06-23 NOTE — Telephone Encounter (Signed)
Pt requests refill on percocet.  Left Rx for Dr. Gaylyn Rong to sign on his desk. Instructed pt to check back tomorrow for Rx.  She verbalized understanding.

## 2011-06-25 IMAGING — CT CT CHEST W/ CM
2 of 6 series · 16 of 46 positions shown, 18 images · IV contrast (agent unspecified)
Comparison: Plain film chest of 11/06/2008.  Abdominal/pelvic CT of
10/25/2008

CT CHEST

CLINICAL DATA: Rectal cancer diagnosed [DATE].  Restaging.  History
of anemia and GI bleeding.  No current complaints.  Chemotherapy
and radiation therapy complete.

CT CHEST, ABDOMEN AND PELVIS WITH CONTRAST
TECHNIQUE: Contiguous axial images of the chest abdomen and pelvis
were obtained after IV contrast administration.
Contrast: 100  ml Cmnipaque-JEE

[Series 2: cap with st · axial · 0.79mm/px · z∈[-562,-42]mm · 13 of 120 slices shown, 15 images]
[im 8/120  soft-tissue]
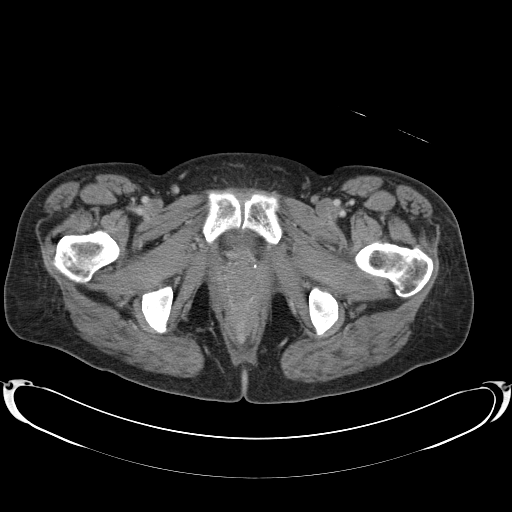
[im 8/120  bone]
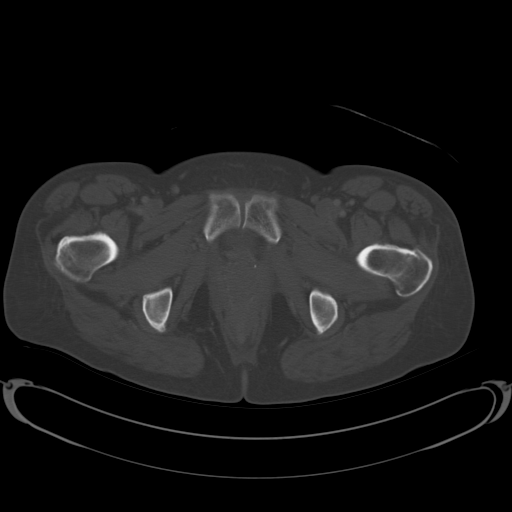
[im 15/120  soft-tissue]
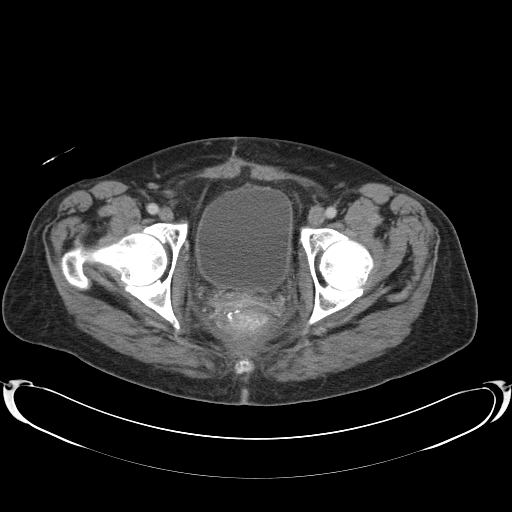
[im 23/120  soft-tissue]
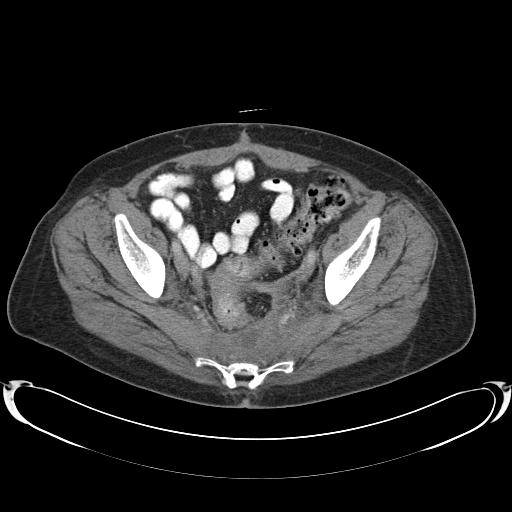
[im 38/120  soft-tissue]
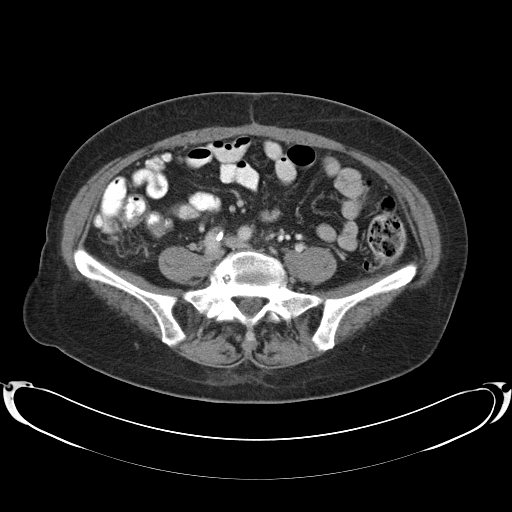
[im 45/120  soft-tissue]
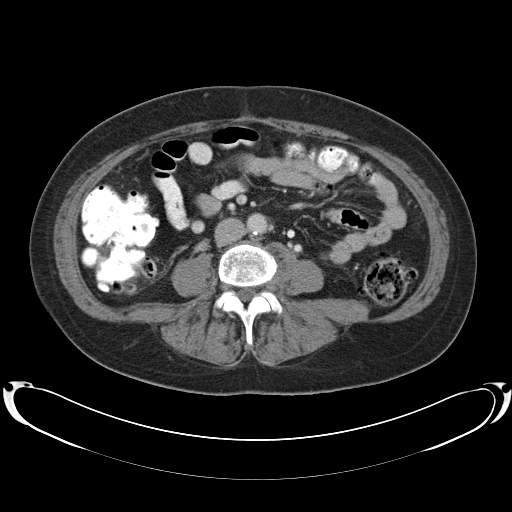
[im 53/120  soft-tissue]
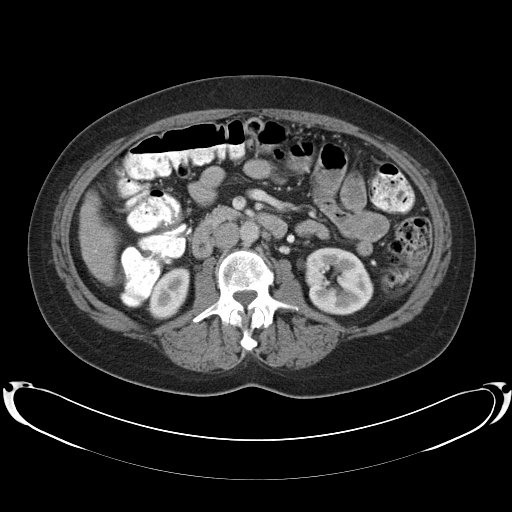
[im 60/120  soft-tissue]
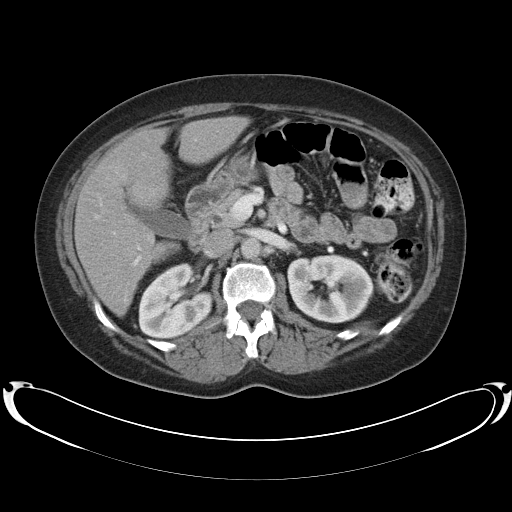
[im 67/120  soft-tissue]
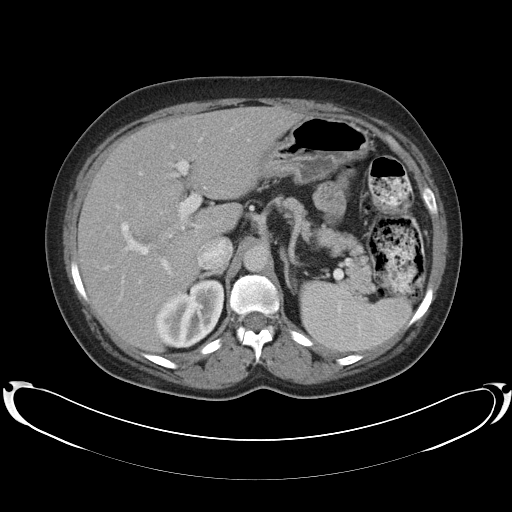
[im 75/120  soft-tissue]
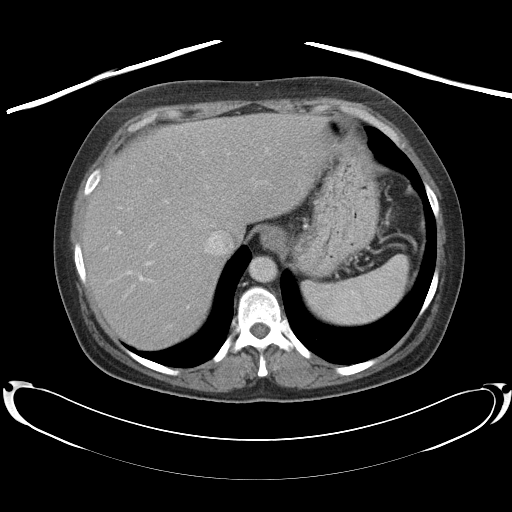
[im 75/120  bone]
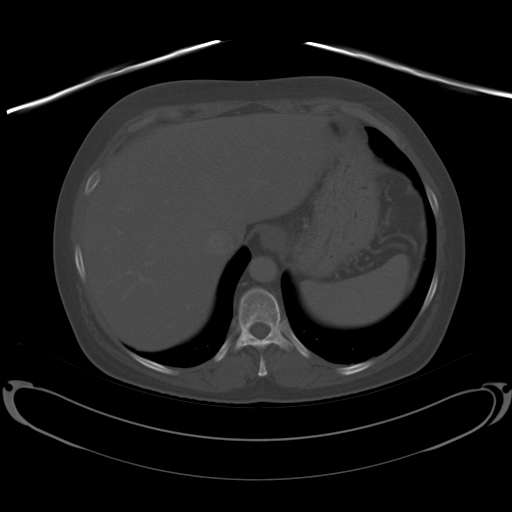
[im 82/120  soft-tissue]
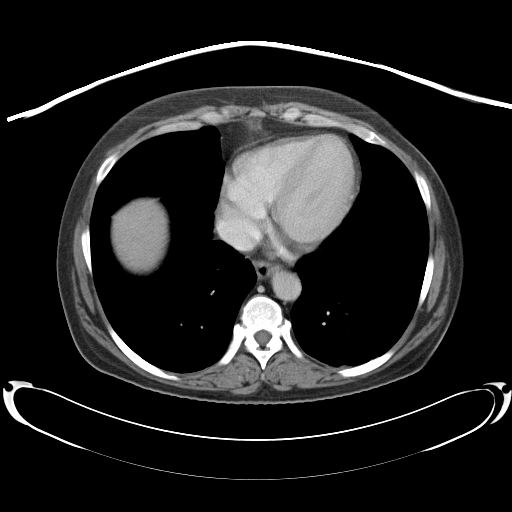
[im 97/120  soft-tissue]
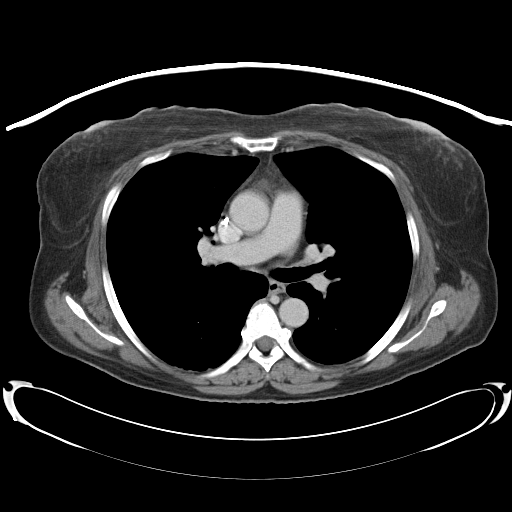
[im 105/120  soft-tissue]
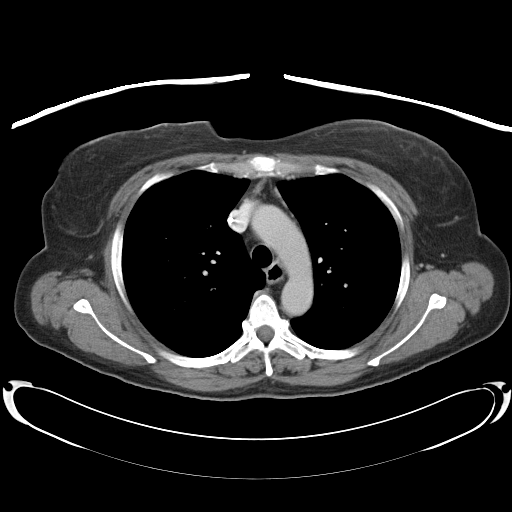
[im 112/120  soft-tissue]
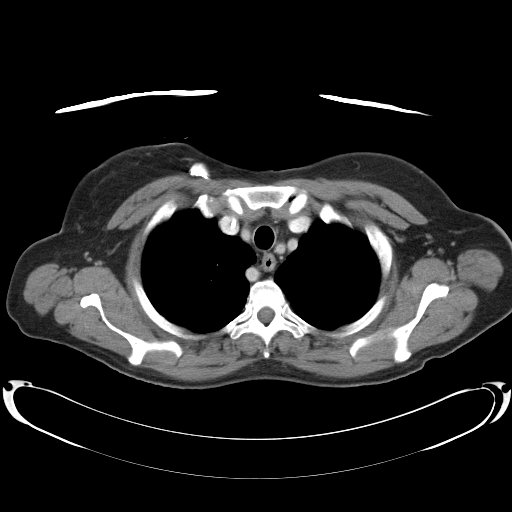

[Series 602: <mpr thick range> · coronal · 1.17mm/px · 3 of 91 slices shown]
[im 31/91  soft-tissue]
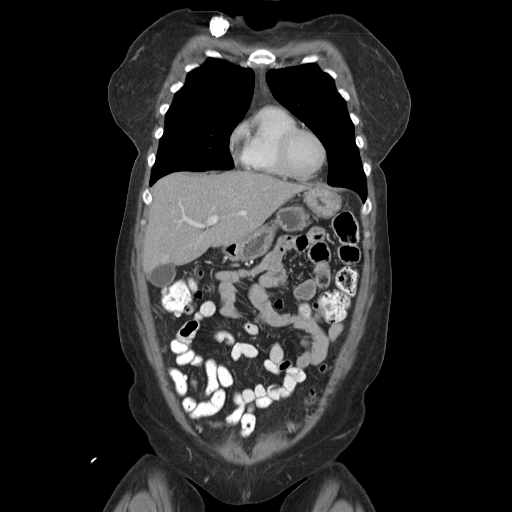
[im 41/91  soft-tissue]
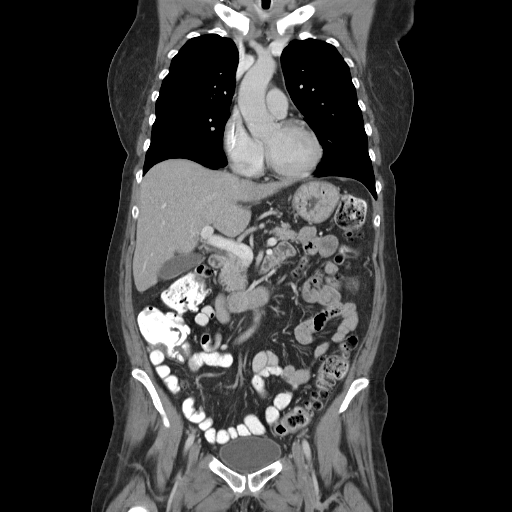
[im 51/91  soft-tissue]
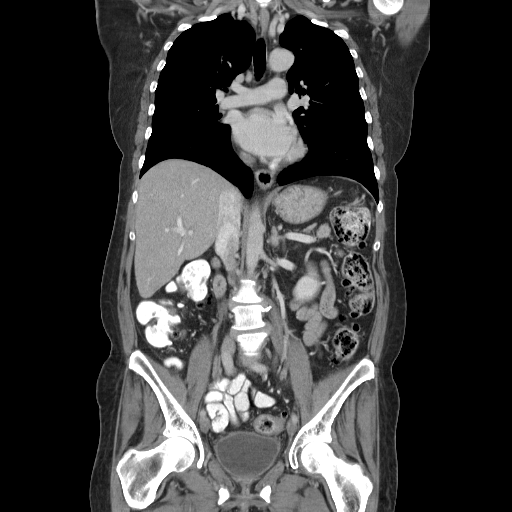

[16 of 46 positions shown; findings below may reference images not displayed]

FINDINGS: Lung windows demonstrate no suspicious lung nodule.
Minimal subsegmental atelectasis with pleural based nodularity is
similar to on the prior exam.

Soft tissue windows demonstrate a right-sided Port-A-Cath which
terminates at the high right atrium.  An aberrant right subclavian
artery, arising from the distal transverse segment.  This extends
posterior to the esophagus without significant proximal esophageal
dilatation. Normal heart size without pericardial or pleural
effusion.  8 mm right paratracheal lymph node. No mediastinal or
hilar adenopathy.Healing 6th lateral left rib fracture on image 30
is without underlying mass.
IMPRESSION: 1. No acute process or evidence of metastatic disease in the chest.
2.  Aberrant right subclavian artery.
3.  Healing left sixth rib fracture.

CT ABDOMEN AND PELVIS
FINDINGS: No focal liver lesions.  The previous described tiny
hypoattenuating areas are not appreciated on the current exam.
Normal spleen.  The proximal stomach is underdistended.  Normal
pancreas, gallbladder, biliary tract, adrenal glands, kidneys.
Small retroperitoneal lymph nodes are again identified.  A left
para-aortic node measures 7 mm on image 64 versus 1.0 cm on the
prior exam.  No new adenopathy.

There has been interval resection of the previous described rectal
primary.  Reanastomosis with surgical sutures identified on image
102.  The immediately proximal rectosigmoid junction is
underdistended.  There is extensive colonic diverticulosis. Normal
terminal ileum and appendix.

There is presacral fascial thickening an ill-defined fluid density.
No abnormal enhancement to suggest abscess or locally
recurrent/residual tumor.  No osseous destruction.

Normal small bowel without abdominal ascites.

No pelvic sidewall adenopathy.  The perirectal lymph nodes
described on the prior exam have been resected.  Normal appearance
of the urinary bladder.  Possible partial hysterectomy.  No adnexal
mass or significant cul-de-sac fluid.

Degenerative disc disease at L3-L4.  Spondylosis at L3-S1.
IMPRESSION: 1.  Interval resection of previously described rectal cancer.
Presacral fascial thickening and ill defined edema/fluid, likely
postoperative.
2.  No evidence of metastatic disease within the pelvis.
3.  Slight decrease in size of a left periaortic lymph node.  This
was borderline enlarged on the prior exam.  This could simply be a
reactive node or represent response to therapy of a retroperitoneal
metastasis.  The former is favored.

## 2011-07-04 ENCOUNTER — Other Ambulatory Visit: Payer: Self-pay | Admitting: Oncology

## 2011-07-07 ENCOUNTER — Telehealth: Payer: Self-pay | Admitting: *Deleted

## 2011-07-07 MED ORDER — HYDROCHLOROTHIAZIDE 25 MG PO TABS: 25.0000 mg | ORAL_TABLET | Freq: Every day | ORAL | Status: DC

## 2011-07-07 NOTE — Telephone Encounter (Signed)
Pt called,  States needs refill on HCTZ.  It appears Rx was sent on 07/04/11 to [facility] on [address],  But pt says she spoke to [facility] today and they say they do not have the refill.   I re-sent the Rx  Electronically to [facility] and instructed pt to call tomorrow if they say they still do not have it.  She verbalized understanding.

## 2011-07-16 ENCOUNTER — Ambulatory Visit (HOSPITAL_BASED_OUTPATIENT_CLINIC_OR_DEPARTMENT_OTHER): Payer: Medicare Other

## 2011-07-16 VITALS — BP 117/76 | HR 64 | Temp 98.0°F

## 2011-07-16 DIAGNOSIS — C20 Malignant neoplasm of rectum: Secondary | ICD-10-CM

## 2011-07-16 DIAGNOSIS — Z469 Encounter for fitting and adjustment of unspecified device: Secondary | ICD-10-CM | POA: Diagnosis not present

## 2011-07-16 MED ORDER — SODIUM CHLORIDE 0.9 % IJ SOLN
10.0000 mL | INTRAMUSCULAR | Status: DC | PRN
  Administered 2011-07-16: 10 mL via INTRAVENOUS
  Filled 2011-07-16: qty 10

## 2011-07-16 MED ORDER — HEPARIN SOD (PORK) LOCK FLUSH 100 UNIT/ML IV SOLN
500.0000 [IU] | Freq: Once | INTRAVENOUS | Status: AC
  Administered 2011-07-16: 500 [IU] via INTRAVENOUS
  Filled 2011-07-16: qty 5

## 2011-07-25 ENCOUNTER — Other Ambulatory Visit: Payer: Self-pay | Admitting: *Deleted

## 2011-07-25 DIAGNOSIS — Z85048 Personal history of other malignant neoplasm of rectum, rectosigmoid junction, and anus: Secondary | ICD-10-CM

## 2011-07-25 DIAGNOSIS — G629 Polyneuropathy, unspecified: Secondary | ICD-10-CM

## 2011-07-25 MED ORDER — OXYCODONE-ACETAMINOPHEN 5-325 MG PO TABS: 1.0000 | ORAL_TABLET | Freq: Two times a day (BID) | ORAL | Status: DC | PRN

## 2011-07-25 NOTE — Telephone Encounter (Signed)
Pt called for refill on percocet.  Signed Rx left in book in injection room for pt to pick up.  Called pt back and notified rx ready for pick up.  She verbalized understanding.

## 2011-08-27 ENCOUNTER — Other Ambulatory Visit: Payer: Self-pay | Admitting: *Deleted

## 2011-08-27 DIAGNOSIS — Z85048 Personal history of other malignant neoplasm of rectum, rectosigmoid junction, and anus: Secondary | ICD-10-CM

## 2011-08-27 DIAGNOSIS — G629 Polyneuropathy, unspecified: Secondary | ICD-10-CM

## 2011-08-27 MED ORDER — OXYCODONE-ACETAMINOPHEN 5-325 MG PO TABS: 1.0000 | ORAL_TABLET | Freq: Two times a day (BID) | ORAL | Status: DC | PRN

## 2011-08-27 NOTE — Telephone Encounter (Signed)
Pt left VM requesting refill on Percocet.  Rx left in book/injection room and left Vm for pt that rx is ready to pick up.

## 2011-09-26 ENCOUNTER — Other Ambulatory Visit: Payer: Self-pay | Admitting: *Deleted

## 2011-09-26 DIAGNOSIS — Z85048 Personal history of other malignant neoplasm of rectum, rectosigmoid junction, and anus: Secondary | ICD-10-CM

## 2011-09-26 DIAGNOSIS — G629 Polyneuropathy, unspecified: Secondary | ICD-10-CM

## 2011-09-26 MED ORDER — OXYCODONE-ACETAMINOPHEN 5-325 MG PO TABS: 1.0000 | ORAL_TABLET | Freq: Two times a day (BID) | ORAL | Status: DC | PRN

## 2011-09-26 NOTE — Telephone Encounter (Signed)
Pt called to request refill on percocet and wants to confirm her next appt..  Rx left for pt to p/u in injection room book.  Informed pt of rx ready to p/u and confirmed her appts on 10/13/11.  She verbalized understanding.

## 2011-10-10 ENCOUNTER — Emergency Department (HOSPITAL_COMMUNITY)
Admission: EM | Admit: 2011-10-10 | Discharge: 2011-10-11 | Disposition: A | Discharge status: Home / Self Care | Payer: Medicare Other | Attending: Emergency Medicine | Admitting: Emergency Medicine

## 2011-10-10 ENCOUNTER — Emergency Department (HOSPITAL_COMMUNITY): Payer: Medicare Other

## 2011-10-10 ENCOUNTER — Encounter (HOSPITAL_COMMUNITY): Payer: Self-pay | Admitting: Family Medicine

## 2011-10-10 DIAGNOSIS — K5289 Other specified noninfective gastroenteritis and colitis: Secondary | ICD-10-CM | POA: Diagnosis not present

## 2011-10-10 DIAGNOSIS — K529 Noninfective gastroenteritis and colitis, unspecified: Secondary | ICD-10-CM

## 2011-10-10 DIAGNOSIS — K9401 Colostomy hemorrhage: Secondary | ICD-10-CM

## 2011-10-10 DIAGNOSIS — R10819 Abdominal tenderness, unspecified site: Secondary | ICD-10-CM | POA: Insufficient documentation

## 2011-10-10 DIAGNOSIS — Z85038 Personal history of other malignant neoplasm of large intestine: Secondary | ICD-10-CM | POA: Diagnosis not present

## 2011-10-10 DIAGNOSIS — R109 Unspecified abdominal pain: Secondary | ICD-10-CM | POA: Diagnosis not present

## 2011-10-10 DIAGNOSIS — IMO0002 Reserved for concepts with insufficient information to code with codable children: Secondary | ICD-10-CM | POA: Insufficient documentation

## 2011-10-10 DIAGNOSIS — I10 Essential (primary) hypertension: Secondary | ICD-10-CM | POA: Insufficient documentation

## 2011-10-10 HISTORY — DX: Noninfective gastroenteritis and colitis, unspecified: K52.9

## 2011-10-10 LAB — COMPREHENSIVE METABOLIC PANEL
ALT: 52 U/L — ABNORMAL HIGH (ref 0–35)
AST: 38 U/L — ABNORMAL HIGH (ref 0–37)
Albumin: 3.6 g/dL (ref 3.5–5.2)
Alkaline Phosphatase: 112 U/L (ref 39–117)
BUN: 14 mg/dL (ref 6–23)
CO2: 20 mEq/L (ref 19–32)
Calcium: 8.9 mg/dL (ref 8.4–10.5)
Chloride: 101 mEq/L (ref 96–112)
Creatinine, Ser: 0.67 mg/dL (ref 0.50–1.10)
GFR calc Af Amer: >90 mL/min (ref >90)
GFR calc non Af Amer: >90 mL/min (ref >90)
Glucose, Bld: 69 mg/dL — ABNORMAL LOW (ref 70–99)
Potassium: 3.4 mEq/L — ABNORMAL LOW (ref 3.5–5.1)
Sodium: 137 mEq/L (ref 135–145)
Total Bilirubin: 0.7 mg/dL (ref 0.3–1.2)
Total Protein: 6.5 g/dL (ref 6.0–8.3)

## 2011-10-10 LAB — TYPE AND SCREEN
ABO/RH(D): O POS
Antibody Screen: NEGATIVE

## 2011-10-10 LAB — CBC WITH DIFFERENTIAL/PLATELET
Basophils Absolute: 0 10*3/uL (ref 0.0–0.1)
Basophils Relative: 0 % (ref 0–1)
Eosinophils Absolute: 0.1 10*3/uL (ref 0.0–0.7)
Eosinophils Relative: 1 % (ref 0–5)
HCT: 35.4 % — ABNORMAL LOW (ref 36.0–46.0)
Hemoglobin: 11.9 g/dL — ABNORMAL LOW (ref 12.0–15.0)
Lymphocytes Relative: 21 % (ref 12–46)
Lymphs Abs: 2.1 10*3/uL (ref 0.7–4.0)
MCH: 32.3 pg (ref 26.0–34.0)
MCHC: 33.6 g/dL (ref 30.0–36.0)
MCV: 96.2 fL (ref 78.0–100.0)
Monocytes Absolute: 0.9 10*3/uL (ref 0.1–1.0)
Monocytes Relative: 9 % (ref 3–12)
Neutro Abs: 6.6 10*3/uL (ref 1.7–7.7)
Neutrophils Relative %: 68 % (ref 43–77)
Platelets: 308 10*3/uL (ref 150–400)
RBC: 3.68 MIL/uL — ABNORMAL LOW (ref 3.87–5.11)
RDW: 14.2 % (ref 11.5–15.5)
WBC: 9.7 10*3/uL (ref 4.0–10.5)

## 2011-10-10 LAB — LIPASE, BLOOD: Lipase: 15 U/L (ref 11–59)

## 2011-10-10 MED ORDER — METRONIDAZOLE IN NACL 5-0.79 MG/ML-% IV SOLN
500.0000 mg | Freq: Once | INTRAVENOUS | Status: AC
  Administered 2011-10-10: 500 mg via INTRAVENOUS
  Filled 2011-10-10: qty 100

## 2011-10-10 MED ORDER — IOHEXOL 300 MG/ML  SOLN
100.0000 mL | Freq: Once | INTRAMUSCULAR | Status: AC | PRN
  Administered 2011-10-10: 100 mL via INTRAVENOUS

## 2011-10-10 MED ORDER — MORPHINE SULFATE 4 MG/ML IJ SOLN
4.0000 mg | Freq: Once | INTRAMUSCULAR | Status: AC
  Administered 2011-10-10: 4 mg via INTRAVENOUS
  Filled 2011-10-10: qty 1

## 2011-10-10 MED ORDER — SODIUM CHLORIDE 0.9 % IV SOLN
Freq: Once | INTRAVENOUS | Status: AC
  Administered 2011-10-10: 22:00:00 via INTRAVENOUS

## 2011-10-10 MED ORDER — CIPROFLOXACIN IN D5W 400 MG/200ML IV SOLN
400.0000 mg | Freq: Once | INTRAVENOUS | Status: AC
  Administered 2011-10-11: 400 mg via INTRAVENOUS
  Filled 2011-10-10: qty 200

## 2011-10-10 NOTE — ED Notes (Signed)
Patient attempted to void in cup and missed cup or forgot to go in cup. Will attempt again.

## 2011-10-10 NOTE — ED Provider Notes (Signed)
History     CSN: [id number]  Arrival date & time 10/10/11  1800   First MD Initiated Contact with Patient 10/10/11 2019      Chief Complaint  Patient presents with  . Abdominal Pain  . Blood in colostomy bag     (Consider location/radiation/quality/duration/timing/severity/associated sxs/prior treatment) HPI  H/o colon ca (last chemo 2011, last radiation 2010) s/p colostomy pw BRB per colostomy. C/O mild weakness. No dizziness. C/O diffuse abdominal cramping > lower abd this morning. Denies hematuria/dysuria/freq/urgency. No anticoagulants. H/O GI bleed in past from colon cancer. Denies diarrhea. Colostomy placed 05/2011 at [facility].  Normal amount of ostomy output.   ED Notes, ED Provider Notes from 10/10/11 0000 to 10/10/11 18:31:58       Benay Spice Dupell, RN 10/10/2011 18:30      Pt reports noting brb in colostomy bag this morning. Reports bag would get about 1/3 full of blood, and she has changed it approx 5 times. Reports colostomy done in Feb 2013. States stoma is normal for her as far as she knows.    Past Medical History  Diagnosis Date  . 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 Hypertension   . Diverticulosis   . Radiation proctitis     mild  . Adenomatous colon polyp   . Anemia, unspecified   . 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 Mouth surgery 10/2009  . Low anterior bowel resection 03/07/09  . Colostomy     Family History  Problem Relation Age of Onset  . Colon cancer Mother   . Stomach cancer Sister   . Stomach cancer Sister   . Colon polyps Sister   . Diabetes Father   . Cirrhosis Brother     History  Substance Use Topics  . Smoking status: Current Everyday Smoker -- 0.3 packs/day    Types: Cigarettes  . Smokeless tobacco: Never Used   Comment: divorced, lives with sister Meryl Dare. 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 Alcohol Use: No      one can  domestic beer per night    OB History    Grav Para Term Preterm Abortions TAB SAB Ect Mult Living                  Review of Systems  All other systems reviewed and are negative.  except as noted HPI   Allergies  Ampicillin and Hydrocodone  Home Medications   Current Outpatient Rx  Name Route Sig Dispense Refill  . GABAPENTIN 100 MG PO CAPS Oral Take 300 mg by mouth 2 (two) times daily.    [name] HYDROCHLOROTHIAZIDE 25 MG PO TABS Oral Take 1 tablet (25 mg total) by mouth daily. 90 tablet 2  . OXYCODONE-ACETAMINOPHEN 5-325 MG PO TABS Oral Take 1 tablet by mouth every 12 (twelve) hours as needed for pain. 60 tablet 0  . CIPROFLOXACIN HCL 500 MG PO TABS Oral Take 1 tablet (500 mg total) by mouth 3 (three) times daily. 20 tablet 0  . METRONIDAZOLE 500 MG PO TABS Oral Take 1 tablet (500 mg total) by mouth 3 (three) times daily. 30 tablet 0    BP 137/90  Pulse 65  Temp 98.9 F (37.2 C) (Oral)  Resp 16  SpO2 98%  Physical Exam  Nursing note and vitals reviewed. Constitutional: She is oriented to person, place, and time. She appears well-developed.  HENT:  Head: Atraumatic.  Mouth/Throat: Oropharynx is clear and moist.  Eyes: Conjunctivae and EOM are normal. Pupils are equal, round, and reactive to light.  Neck: Normal range of motion. Neck supple.  Cardiovascular: Normal rate, regular rhythm, normal heart sounds and intact distal pulses.   Pulmonary/Chest: Effort normal and breath sounds normal. No respiratory distress. She has no wheezes. She has no rales.  Abdominal: Soft. She exhibits no distension. There is tenderness. There is no rebound and no guarding.       Diffuse lower abd ttp Stoma pink and viable BRB per stoma  Musculoskeletal: Normal range of motion.  Neurological: She is alert and oriented to person, place, and time.  Skin: Skin is warm and dry. No rash noted.  Psychiatric: She has a normal mood and affect.    ED Course  Procedures (including critical care  time)  Labs Reviewed  CBC WITH DIFFERENTIAL - Abnormal; Notable for the following:    RBC 3.68 (*)     Hemoglobin 11.9 (*)     HCT 35.4 (*)     All other components within normal limits  COMPREHENSIVE METABOLIC PANEL - Abnormal; Notable for the following:    Potassium 3.4 (*)     Glucose, Bld 69 (*)     AST 38 (*)     ALT 52 (*)     All other components within normal limits  LIPASE, BLOOD  TYPE AND SCREEN  SPECIMEN HOLD  URINALYSIS, ROUTINE W REFLEX MICROSCOPIC  ABO/RH   Ct Abdomen Pelvis W Contrast  10/10/2011  *RADIOLOGY REPORT*  Clinical Data: Blood in colostomy bag.  CT ABDOMEN AND PELVIS WITH CONTRAST  Technique:  Multidetector CT imaging of the abdomen and pelvis was performed following the standard protocol during bolus administration of intravenous contrast.  Contrast: OMNIPAQUE IOHEXOL 300 MG/ML  SOLN  Comparison: 04/28/2011  Findings: The tip of a central venous catheter is noted at the cavoatrial junction.  Normal heart size.  Lung bases are clear.  No pleural or pericardial effusion.  Focal hypoattenuation adjacent to the falciform ligament.  Unremarkable biliary system, spleen, pancreas, adrenal glands.  Symmetric renal enhancement.  No hydronephrosis or hydroureter.  No bowel obstruction.  Postoperative changes involving the rectum with anastomotic suture noted.  There is thickening of the presacral fat and a small loculated fluid collection that is similar to prior.  Colonic diverticulosis.  Left lower quadrant diverting colostomy. There is marked thickening of the segment of transverse colon leading to the colostomy with associated pericolonic fat stranding.  No free intraperitoneal air or fluid.  No lymphadenopathy.  There is scattered atherosclerotic calcification of the aorta and its branches. No aneurysmal dilatation.  Thin-walled bladder.  Unchanged appearance to the uterus and adnexa.  Multilevel degenerative changes.  No acute osseous finding.  IMPRESSION: Status  post diverting colostomy.  There is marked thickening of the segment of transverse colon leading to the colostomy, in keeping with a nonspecific colitis (infectious, inflammatory, and ischemic considerations).  Given the underlying diverticulosis, diverticulitis is also a consideration.  Presacral soft tissue and fluid attenuation is without significant interval change in this patient status post surgery and radiation.  Hypoattenuation within the liver adjacent to the falciform ligament.  This is a common location for focal fat are very perfusion.  However, given that I do not appreciate this appearance on the prior, recommend close attention on follow-up.  Original Report Authenticated By: Waneta Martins, M.D.    1. Colitis  2. Bleeding from colostomy     MDM  History of rectal cancer, colostomy presents with bright red blood per colostomy. She seemed to have a significant amount of bleeding prior to arrival to the emergency department. Since she's been here there has not been a significant amount of blood per her ostomy. She is not having a significant amount of pain and appears well. CT abdomen pelvis with nonspecific colitis. I discussed the case with the [facility] hospitalist who feels that she did not meet inpatient criteria. I also discussed with surgery. They recommend clear liquid diet over the weekend. Patient given Cipro and Flagyl for possible infectious etiology. She appears quite well, ischemic colitis a consideration given her history radiation. Patient is agreeable to discharge home. She has been given strict precautions were returned. She will followup with gastroenterology, oncology, her primary care doctor in 3 days. Strict precautions for return to the Emergency Department.        Forbes Cellar, MD 10/11/11 [phone]

## 2011-10-10 NOTE — ED Notes (Addendum)
AC notified that pt needs colostomy supplies, AC to obtain from central supply and bring to pt room

## 2011-10-10 NOTE — ED Notes (Signed)
Paged IV team to access PORTA Cath

## 2011-10-10 NOTE — ED Notes (Addendum)
Pt has LUQ colostomy, paged facilities to order supplies for pt.

## 2011-10-10 NOTE — ED Notes (Signed)
Pt reports noting brb in colostomy bag this morning.  Reports bag would get about 1/3 full of blood, and she has changed it approx 5 times. Reports colostomy done in Feb 2013.  States stoma is normal for her as far as she knows.

## 2011-10-11 LAB — URINALYSIS, ROUTINE W REFLEX MICROSCOPIC
Bilirubin Urine: NEGATIVE
Glucose, UA: NEGATIVE mg/dL
Ketones, ur: 15 mg/dL — AB
Nitrite: NEGATIVE
Protein, ur: NEGATIVE mg/dL
Specific Gravity, Urine: >1.046 — ABNORMAL HIGH (ref 1.005–1.030)
Urobilinogen, UA: 0.2 mg/dL (ref 0.0–1.0)
pH: 5 (ref 5.0–8.0)

## 2011-10-11 LAB — URINE MICROSCOPIC-ADD ON

## 2011-10-11 LAB — ABO/RH: ABO/RH(D): O POS

## 2011-10-11 MED ORDER — CIPROFLOXACIN HCL 500 MG PO TABS: 500.0000 mg | ORAL_TABLET | Freq: Three times a day (TID) | ORAL | Status: AC

## 2011-10-11 MED ORDER — METRONIDAZOLE 500 MG PO TABS: 500.0000 mg | ORAL_TABLET | Freq: Three times a day (TID) | ORAL | Status: DC

## 2011-10-11 NOTE — Discharge Instructions (Signed)
Return to ED immediately for worsening pain, bleeding, or if you are concerned.  Clear Liquid Diet The clear liquid dietconsists of foods that are liquid or will become liquid at room temperature.You should be able to see through the liquid and beverages. Examples of foods allowed on a clear liquid diet include fruit juice, broth or bouillon, gelatin, or frozen ice pops. The purpose of this diet is to provide necessary fluid, electrolytes such as sodium and potassium, and energy to keep the body functioning during times when you are not able to consume a regular diet.A clear liquid diet should not be continued for long periods of time as it is not nutritionally adequate.  REASONS FOR USING A CLEAR LIQUID DIET  In sudden onset (acute) conditions for a patient before or after surgery.   As the first step in oral feeding.   For fluid and electrolyte replacement in diarrheal diseases.   As a diet before certain medical tests are performed.  ADEQUACY The clear liquid diet is adequate only in ascorbic acid, according to the Recommended Dietary Allowances of the [employer]. CHOOSING FOODS Breads and Starches  Allowed:  None are allowed.   Avoid: All are avoided.  Vegetables  Allowed:  Strained tomato or vegetable juice.   Avoid: Any others.  Fruit  Allowed:  Strained fruit juices and fruit drinks. Include 1 serving of citrus or vitamin C-enriched fruit juice daily.   Avoid: Any others.  Meat and Meat Substitutes  Allowed:  None are allowed.   Avoid: All are avoided.  Milk  Allowed:  None are allowed.   Avoid: All are avoided.  Soups and Combination Foods  Allowed:  Clear bouillon, broth, or strained broth-based soups.   Avoid: Any others.  Desserts and Sweets  Allowed:  Sugar, honey. High protein gelatin. Flavored gelatin, ices, or frozen ice pops that do not contain milk.   Avoid: Any others.  Fats and Oils  Allowed:  None are allowed.   Avoid: All  are avoided.  Beverages  Allowed: Cereal beverages, coffee (regular or decaffeinated), tea, or soda at the discretion of your caregiver.   Avoid: Any others.  Condiments  Allowed:  Iodized salt.   Avoid: Any others, including pepper.  Supplements  Allowed:  Liquid nutrition beverages.   Avoid: Any others that contain lactose or fiber.  SAMPLE MEAL PLAN Breakfast  4 oz (120 mL) strained orange juice.    to 1 cup (125 to 250 mL) gelatin (plain or fortified).   1 cup (250 mL) beverage (coffee or tea).   Sugar, if desired.  Midmorning Snack   cup (125 mL) gelatin (plain or fortified).  Lunch  1 cup (250 mL) broth or consomm.   4 oz (120 mL) strained grapefruit juice.    cup (125 mL) gelatin (plain or fortified).   1 cup (250 mL) beverage (coffee or tea).   Sugar, if desired.  Midafternoon Snack   cup (125 mL) fruit ice.    cup (125 mL) strained fruit juice.  Dinner  1 cup (250 mL) broth or consomm.    cup (125 mL) cranberry juice.    cup (125 mL) flavored gelatin (plain or fortified).   1 cup (250 mL) beverage (coffee or tea).   Sugar, if desired.  Evening Snack  4 oz (120 mL) strained apple juice (vitamin C-fortified).    cup (125 mL) flavored gelatin (plain or fortified).  Document Released: 03/31/2005 Document Revised: 03/20/2011 Document Reviewed: 06/28/2010 ExitCare Patient Information  [address] [place].  Colitis Colitis is inflammation of the colon. Colitis can be a short-term or long-standing (chronic) illness. Crohn's disease and ulcerative colitis are 2 types of colitis which are chronic. They usually require lifelong treatment. CAUSES  There are many different causes of colitis, including:  Viruses.   Germs (bacteria).   Medicine reactions.  SYMPTOMS   Diarrhea.   Intestinal bleeding.   Pain.   Fever.   Throwing up (vomiting).   Tiredness (fatigue).   Weight loss.   Bowel blockage.  DIAGNOSIS  The diagnosis  of colitis is based on examination and stool or blood tests. X-rays, CT scan, and colonoscopy may also be needed. TREATMENT  Treatment may include:  Fluids given through the vein (intravenously).   Bowel rest (nothing to eat or drink for a period of time).   Medicine for pain and diarrhea.   Medicines (antibiotics) that kill germs.   Cortisone medicines.   Surgery.  HOME CARE INSTRUCTIONS   Get plenty of rest.   Drink enough water and fluids to keep your urine clear or pale yellow.   Eat a well-balanced diet.   Call your caregiver for follow-up as recommended.  SEEK IMMEDIATE MEDICAL CARE IF:   You develop chills.   You have an oral temperature above 102 F (38.9 C), not controlled by medicine.   You have extreme weakness, fainting, or dehydration.   You have repeated vomiting.   You develop severe belly (abdominal) pain or are passing bloody or tarry stools.  MAKE SURE YOU:   Understand these instructions.   Will watch your condition.   Will get help right away if you are not doing well or get worse.  Document Released: 05/08/2004 Document Revised: 03/20/2011 Document Reviewed: 08/03/2009 [facility] Patient Information 2012 [place], [place].  RESOURCE GUIDE  Dental Problems  Patients with Medicaid: [facility] [phone]                                           [phone] [address] Phone:  [phone]                                                   Phone:  [phone]  If unable to pay or uninsured, contact:  [facility] or [facility]. to become qualified for the adult dental clinic.  Chronic Pain Problems Contact [facility]  [phone] Patients need to be referred by their primary care doctor.  Insufficient Money for Medicine Contact United Way:  call "211" or [facility] [phone].  No Primary Care Doctor Call [facility]  [phone] Other agencies  that provide inexpensive medical care    [facility]  [phone]    [facility]  [phone]    [facility]  [phone]    [facility]  [phone]    [facility]anned Parenthood  [phone]    [facility]  [phone]  Psychological Services [facility]  [phone] [facility] Services  [phone] [facility] Mental Health   [phone] (emergency services [phone])  Abuse/Neglect [facility] Child Abuse Hotline [phone] [facility] Child Abuse Hotline ([phone] (After Hours)  Emergency Shelter [facility] Ministries [phone]  [facility] 9391289451 [place] Alert Services [phone]  MRSA Hotline #:   7164348806    [facility] Resources  [facility]  United [employer]                           [facility] Dept. [address] [place]                     [address]         [address]  Blondell Reveal Phone:  [phone]                                  Phone:  [phone]                   Phone:  [phone]  [facility] Mental Health Phone:  [phone]  [facility] Child Abuse Hotline [phone] [phone] (After Hours)

## 2011-10-12 NOTE — Patient Instructions (Addendum)
1.  History of rectal cancer:  Most recent CT abdomen showed no evidence of recurrence or metastatic disease.  Next CT scan is due in June 2014.   2.  Blood in colostomy bag:  Most likely due to diverticulosis vs inflammatory colitis.  If recurrent problem, may consider follow up with GI to see if further work up or treatment are indicated.   3.  Follow up in about 4-6 months.

## 2011-10-13 ENCOUNTER — Ambulatory Visit (HOSPITAL_BASED_OUTPATIENT_CLINIC_OR_DEPARTMENT_OTHER): Payer: Medicare Other | Admitting: Oncology

## 2011-10-13 ENCOUNTER — Telehealth: Payer: Self-pay | Admitting: Oncology

## 2011-10-13 ENCOUNTER — Other Ambulatory Visit: Payer: Medicare Other | Admitting: Lab

## 2011-10-13 VITALS — BP 132/81 | HR 61 | Temp 98.0°F | Ht 66.0 in | Wt 144.6 lb

## 2011-10-13 DIAGNOSIS — G622 Polyneuropathy due to other toxic agents: Secondary | ICD-10-CM

## 2011-10-13 DIAGNOSIS — Z933 Colostomy status: Secondary | ICD-10-CM | POA: Diagnosis not present

## 2011-10-13 DIAGNOSIS — Z85048 Personal history of other malignant neoplasm of rectum, rectosigmoid junction, and anus: Secondary | ICD-10-CM

## 2011-10-13 DIAGNOSIS — C20 Malignant neoplasm of rectum: Secondary | ICD-10-CM | POA: Diagnosis not present

## 2011-10-13 DIAGNOSIS — K520 Gastroenteritis and colitis due to radiation: Secondary | ICD-10-CM | POA: Diagnosis not present

## 2011-10-13 DIAGNOSIS — G629 Polyneuropathy, unspecified: Secondary | ICD-10-CM

## 2011-10-13 DIAGNOSIS — K625 Hemorrhage of anus and rectum: Secondary | ICD-10-CM

## 2011-10-13 LAB — CBC WITH DIFFERENTIAL/PLATELET
BASO%: 0.4 % (ref 0.0–2.0)
Basophils Absolute: 0 10*3/uL (ref 0.0–0.1)
EOS%: 1.2 % (ref 0.0–7.0)
Eosinophils Absolute: 0.1 10*3/uL (ref 0.0–0.5)
HCT: 36.7 % (ref 34.8–46.6)
HGB: 11.9 g/dL (ref 11.6–15.9)
LYMPH%: 14.3 % (ref 14.0–49.7)
MCH: 32.3 pg (ref 25.1–34.0)
MCHC: 32.5 g/dL (ref 31.5–36.0)
MCV: 99.1 fL (ref 79.5–101.0)
MONO#: 0.6 10*3/uL (ref 0.1–0.9)
MONO%: 8.8 % (ref 0.0–14.0)
NEUT#: 5.5 10*3/uL (ref 1.5–6.5)
NEUT%: 75.3 % (ref 38.4–76.8)
Platelets: 265 10*3/uL (ref 145–400)
RBC: 3.7 10*6/uL (ref 3.70–5.45)
RDW: 14.3 % (ref 11.2–14.5)
WBC: 7.3 10*3/uL (ref 3.9–10.3)
lymph#: 1 10*3/uL (ref 0.9–3.3)

## 2011-10-13 LAB — COMPREHENSIVE METABOLIC PANEL
ALT: 33 U/L (ref 0–35)
AST: 32 U/L (ref 0–37)
Albumin: 3.9 g/dL (ref 3.5–5.2)
Alkaline Phosphatase: 116 U/L (ref 39–117)
BUN: 11 mg/dL (ref 6–23)
CO2: 23 mEq/L (ref 19–32)
Calcium: 8.8 mg/dL (ref 8.4–10.5)
Chloride: 108 mEq/L (ref 96–112)
Creatinine, Ser: 0.71 mg/dL (ref 0.50–1.10)
Glucose, Bld: 96 mg/dL (ref 70–99)
Potassium: 4.2 mEq/L (ref 3.5–5.3)
Sodium: 141 mEq/L (ref 135–145)
Total Bilirubin: 0.7 mg/dL (ref 0.3–1.2)
Total Protein: 6.3 g/dL (ref 6.0–8.3)

## 2011-10-13 LAB — CEA: CEA: 1.1 ng/mL (ref 0.0–5.0)

## 2011-10-13 NOTE — Progress Notes (Signed)
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   PAST THERAPY: Neoadjuvant 5-FU/radiation therapy, status post low anterior resection on March 07, 2009. She started adjuvant FOLFOX on April 16, 2009. Oxaliplatin was discontinued after cycle #10 of FOLFOX given grade II/III neuropathy. She finished chemotherapy on 09/26/2009.  She underwent at [facility] in 05/2011 with transverse loop colostomy due to poor bowel function.    CURRENT THERAPY: Watchful observation.  INTERVAL HISTORY: [name] 56 y.o. female returns for regular follow up.  She had had slight hematochezia ever since the loop colostomy in 05/2011.  However, last week, she had one episode of large hematochezia.  She presented to ED.  It spontaneously resolved.  She still has frequent stool output.  She changes her colostomy bag about 5 times daily with diarrheal stools.  She still has pelvic pain from history of cancer and treatment.  She requires chronic pain meds.  She also has lower extremity neuropathic pain requiring Neurontin.  She still smokes cigarettes and would like to attend smoking cessation class.  She slept on her right arm last week and has pain in the right wrist; about 5/10; without numbness/tingling.  She has no problem with her right elbow and shoulder joints.  She has chronic fatigue.  She is no longer able to work due to these symptoms.   Patient denies headache, visual changes, confusion, drenching night sweats, palpable lymph node swelling, mucositis, odynophagia, dysphagia, nausea vomiting, jaundice, chest pain, palpitation, shortness of breath, dyspnea on exertion, productive cough, gum bleeding, epistaxis, hematemesis, hemoptysis, abdominal pain, abdominal swelling, early satiety, melena, hematuria, skin rash, spontaneous bleeding, joint  swelling, joint pain, heat or cold intolerance, bowel bladder incontinence, back pain.    Past Medical History  Diagnosis Date  . 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 Hypertension   . Diverticulosis   . Radiation proctitis     mild  . Adenomatous colon polyp   . Anemia, unspecified   . 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 Mouth surgery 10/2009  . Low anterior bowel resection 03/07/09  . Colostomy     Current Outpatient Prescriptions  Medication Sig Dispense Refill  . ciprofloxacin (CIPRO) 500 MG tablet Take 1 tablet (500 mg total) by mouth 3 (three) times daily.  20 tablet  0  . gabapentin (NEURONTIN) 100 MG capsule Take 300 mg by mouth 2 (two) times daily.      . hydrochlorothiazide (HYDRODIURIL) 25 MG tablet Take 1 tablet (25 mg total) by mouth daily.  90 tablet  2  . metroNIDAZOLE (FLAGYL) 500 MG tablet Take 1 tablet (500 mg total) by mouth 3 (three) times daily.  30 tablet  0  . oxyCODONE-acetaminophen (PERCOCET) 5-325 MG per tablet Take 1 tablet by mouth every 12 (twelve) hours as needed for pain.  60 tablet  0    ALLERGIES:  is allergic to ampicillin and hydrocodone.  REVIEW OF SYSTEMS:  The rest of the 14-point review of system was negative.   Filed Vitals:   10/13/11 0939  BP: 132/81  Pulse: 61  Temp: 98 F (36.7 C)   Wt Readings from Last 3 Encounters:  10/13/11 144 lb 9.6 oz (65.59 kg)  05/28/11 145 lb  3.2 oz (65.862 kg)  04/09/11 147 lb 1.6 oz (66.724 kg)   ECOG Performance status: 1  PHYSICAL EXAMINATION:  General: well nourished woman, in no acute distress. Eyes: no scleral icterus. ENT: There were no oropharyngeal lesions. Neck was without thyromegaly. Lymphatics: Negative cervical, supraclavicular or axillary adenopathy. Respiratory: lungs were clear bilaterally without wheezing or crackles. Cardiovascular: Regular rate and rhythm, S1/S2, without murmur, rub or gallop.  There was no pedal edema. GI: abdomen was soft, flat, nontender, nondistended, without organomegaly. Muscoloskeletal: no spinal tenderness of palpation of vertebral spine. Skin exam was without echymosis, petichae. Neuro exam was nonfocal. Patient was able to get on and off exam table without assistance. Gait was normal. Patient was alerted and oriented. Attention was good. Language was appropriate. Mood was normal without depression. Speech was not pressured. Thought content was not tangential.   LABORATORY/RADIOLOGY DATA:  Lab Results  Component Value Date   WBC 7.3 10/13/2011   HGB 11.9 10/13/2011   HCT 36.7 10/13/2011   PLT 265 10/13/2011   GLUCOSE 96 10/13/2011   CHOL 168 05/30/2010   TRIG 354.0* 05/30/2010   HDL 53.10 05/30/2010   LDLDIRECT 78.9 05/30/2010   ALKPHOS 116 10/13/2011   ALT 33 10/13/2011   AST 32 10/13/2011   NA 141 10/13/2011   K 4.2 10/13/2011   CL 108 10/13/2011   CREATININE 0.71 10/13/2011   BUN 11 10/13/2011   CO2 23 10/13/2011   INR 0.97 10/17/2009    Ct Abdomen Pelvis W Contrast:  I personally reviewed this CT scan.   10/10/2011  *RADIOLOGY REPORT*  Clinical Data: Blood in colostomy bag.  CT ABDOMEN AND PELVIS WITH CONTRAST  Technique:  Multidetector CT imaging of the abdomen and pelvis was performed following the standard protocol during bolus administration of intravenous contrast.  Contrast: OMNIPAQUE IOHEXOL 300 MG/ML  SOLN  Comparison: 04/28/2011  Findings: The tip of a central venous catheter is noted at the cavoatrial junction.  Normal heart size.  Lung bases are clear.  No pleural or pericardial effusion.  Focal hypoattenuation adjacent to the falciform ligament.  Unremarkable biliary system, spleen, pancreas, adrenal glands.  Symmetric renal enhancement.  No hydronephrosis or hydroureter.  No bowel obstruction.  Postoperative changes involving the rectum with anastomotic suture noted.  There is thickening of the presacral fat and a small loculated fluid collection that is similar  to prior.  Colonic diverticulosis.  Left lower quadrant diverting colostomy. There is marked thickening of the segment of transverse colon leading to the colostomy with associated pericolonic fat stranding.  No free intraperitoneal air or fluid.  No lymphadenopathy.  There is scattered atherosclerotic calcification of the aorta and its branches. No aneurysmal dilatation.  Thin-walled bladder.  Unchanged appearance to the uterus and adnexa.  Multilevel degenerative changes.  No acute osseous finding.  IMPRESSION: Status post diverting colostomy.  There is marked thickening of the segment of transverse colon leading to the colostomy, in keeping with a nonspecific colitis (infectious, inflammatory, and ischemic considerations).  Given the underlying diverticulosis, diverticulitis is also a consideration.  Presacral soft tissue and fluid attenuation is without significant interval change in this patient status post surgery and radiation.  Hypoattenuation within the liver adjacent to the falciform ligament.  This is a common location for focal fat are very perfusion.  However, given that I do not appreciate this appearance on the prior, recommend close attention on follow-up.  Original Report Authenticated By: Waneta Martins, M.D.    ASSESSMENT AND PLAN:  1. History of rectal cancer. She is currently on observation and surveillance. She currently has no evidence of any disease recurrence or metastatic disease today on clinical history, lab and CT. She did have a routine surveillance colonoscopy back in March 2012. Her next one is due March 2015. Her last CT abd 09/2011 was negative Per NCCN guideline, surveillance CT is due yearly; next one is due 09/2012.  2. Radiation induced colitis. She remains on a low residue diet as well as Lomotil prn. She is also on Percocet prn pain.  3. 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 Still residual neuropathy. I increased her Neurotin dose 300mg  PO BID.  4. Hematochezia:  No  anemia.  This is most likely due to diverticulosis vs inflammatory colitis.  It is less common to have radiation-induced colitis about 3 years out.  She is calling GI for appointment for further eval and treatment as appropriate.  5. Hypertension. She is on hydrochlorothiazide per PCP with good blood pressure control.  6. Smoking: She still smokes about 1/2 to 1 pack a day. She is at high risk of secondary cancer.  I referred her to smoking cessation class.  7. Right wrist pain:  Most likely musculoskeletal.  Exam showed no problem in the right shoulder or elbow.  Her right wrist did not show swelling or erythema.  I advised prn pain med and limit activity with right wrist for the next few days.  8. Followup with the Cancer Center in about 4-6 months.         The length of time of the face-to-face encounter was 25 minutes. More than 50% of time was spent counseling and coordination of care.

## 2011-10-13 NOTE — Telephone Encounter (Signed)
gv pt appt schedule for August thru December. Flush appts added per pt request. Per pt port last accessed in ED this past weekend.

## 2011-10-14 ENCOUNTER — Encounter: Payer: Self-pay | Admitting: Internal Medicine

## 2011-10-14 IMAGING — CR DG CHEST 2V
2 series · 2 of 2 positions shown · non-contrast
Comparison: The CT 11/27/2009

CLINICAL DATA: Rectal cancer.  No chest pain, shortness of breath.
History of hypertension.  History of smoking.  The patient reports
a knot on the sternum, soreness.

CHEST - 2 VIEW

[w chest pa]
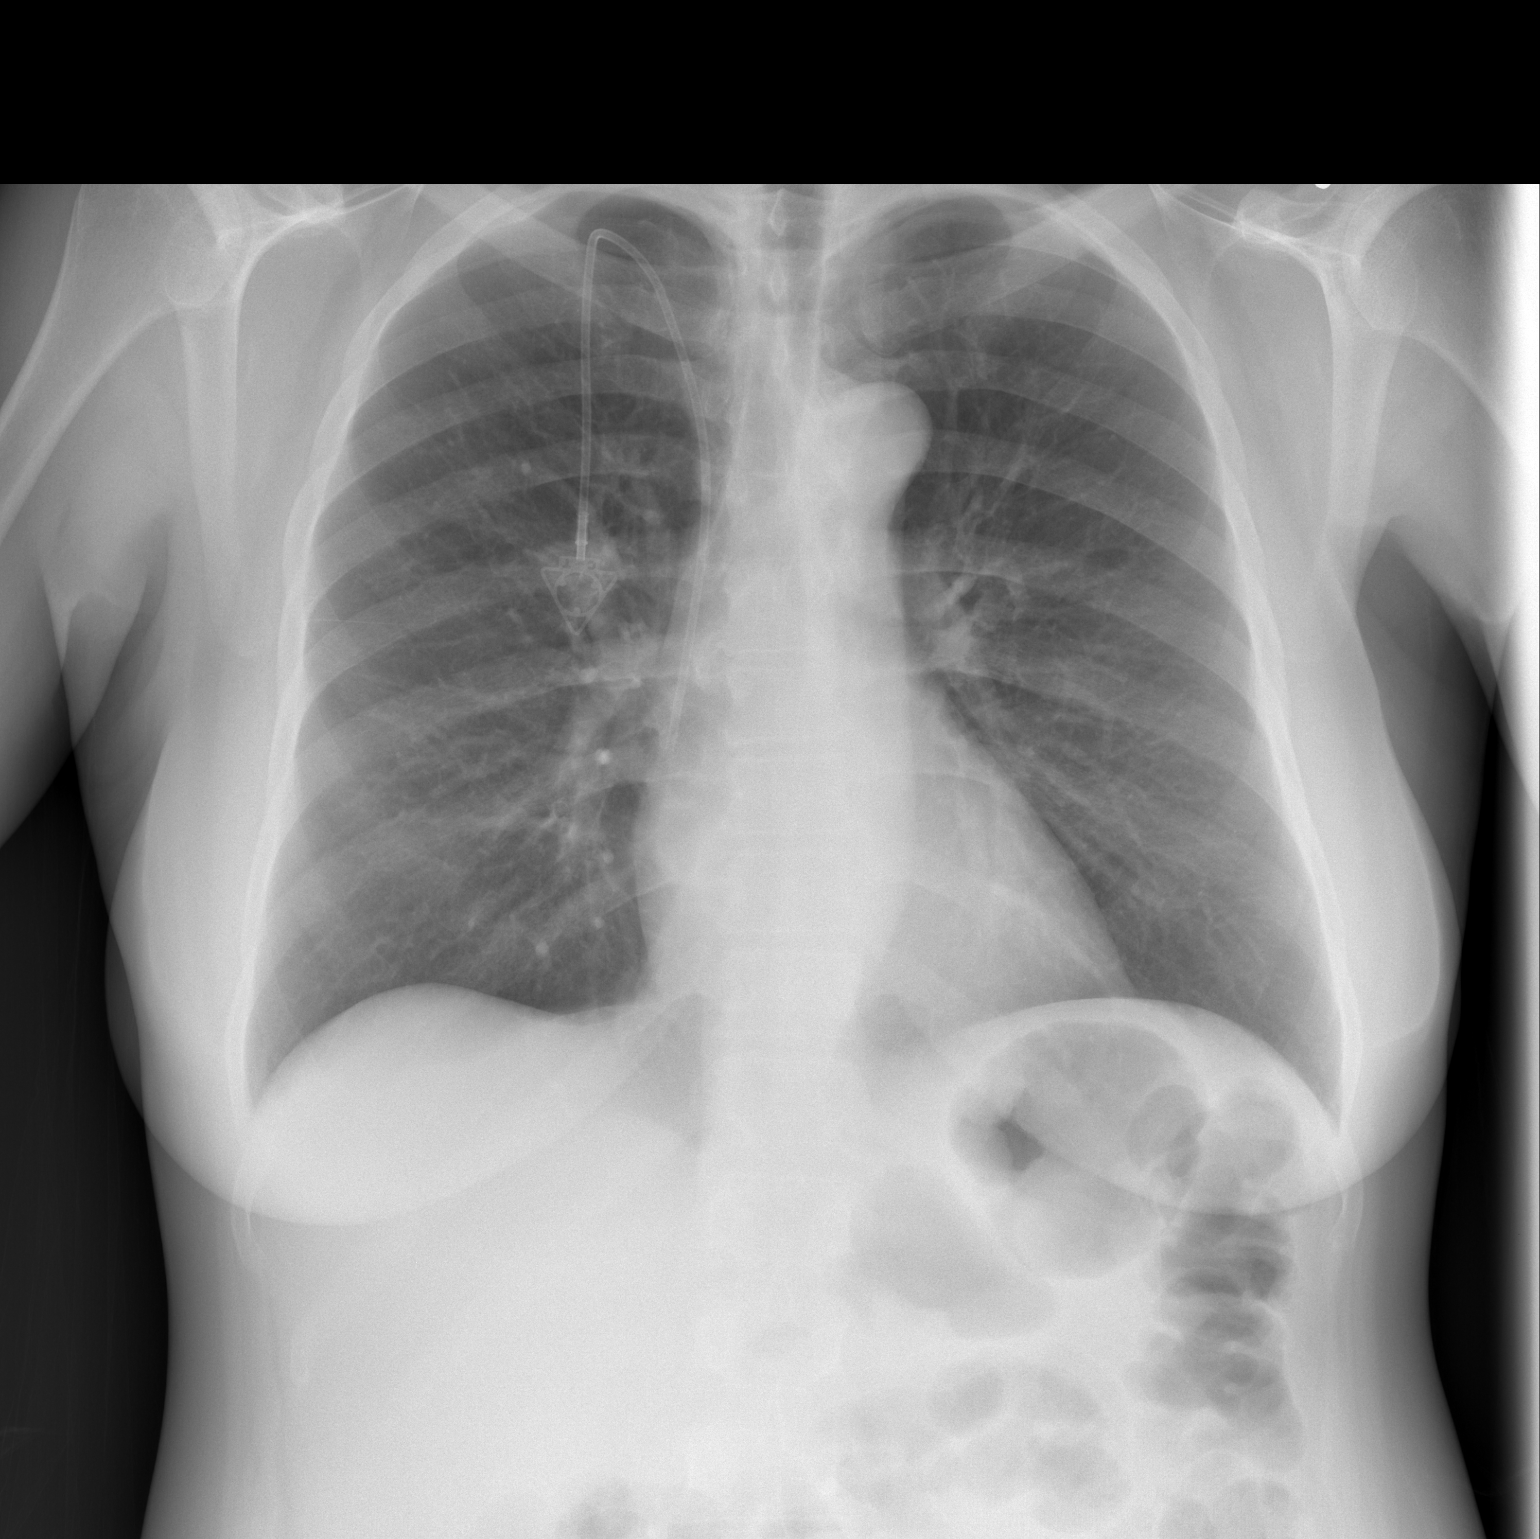

[w chest lat]
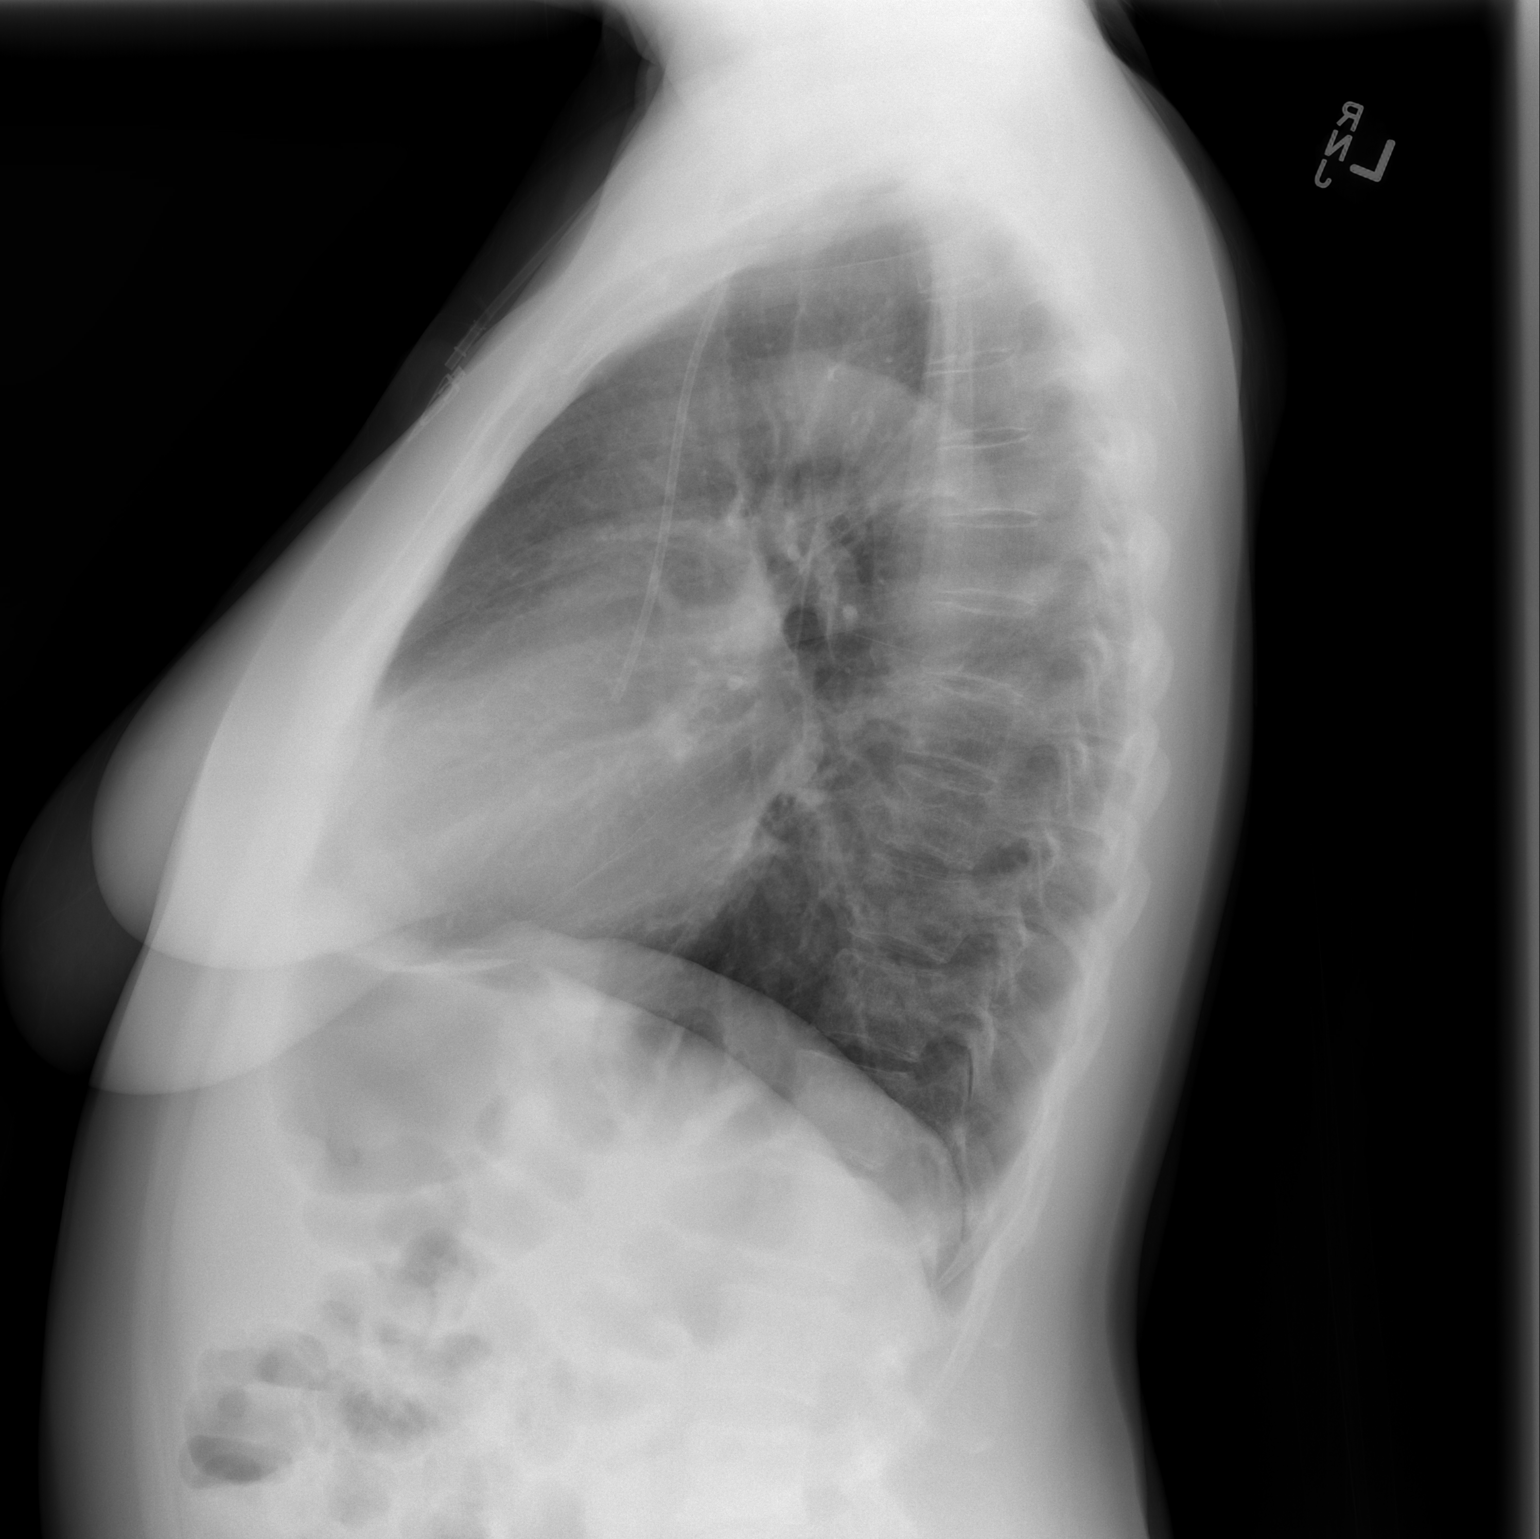

[2 of 2 positions shown; findings below may reference images not displayed]

FINDINGS: The patient has a right-sided Port-A-Cath with tip to the
level of superior vena cava.  The heart size is normal.  Lungs are
free of focal consolidations and pleural effusions.  No pulmonary
nodules are identified. Visualized osseous structures have a normal
appearance.
IMPRESSION: No evidence for acute cardiopulmonary abnormality.

## 2011-10-20 ENCOUNTER — Ambulatory Visit: Payer: Medicare Other | Admitting: Internal Medicine

## 2011-10-21 ENCOUNTER — Telehealth (HOSPITAL_COMMUNITY): Payer: Self-pay | Admitting: Licensed Clinical Social Worker

## 2011-10-21 ENCOUNTER — Encounter (HOSPITAL_COMMUNITY): Payer: Self-pay | Admitting: *Deleted

## 2011-10-21 ENCOUNTER — Emergency Department (HOSPITAL_COMMUNITY)
Admission: EM | Admit: 2011-10-21 | Discharge: 2011-10-21 | Disposition: A | Discharge status: Home / Self Care | Payer: Medicare Other | Attending: Emergency Medicine | Admitting: Emergency Medicine

## 2011-10-21 DIAGNOSIS — T50901A Poisoning by unspecified drugs, medicaments and biological substances, accidental (unintentional), initial encounter: Secondary | ICD-10-CM | POA: Insufficient documentation

## 2011-10-21 DIAGNOSIS — T50904A Poisoning by unspecified drugs, medicaments and biological substances, undetermined, initial encounter: Secondary | ICD-10-CM | POA: Insufficient documentation

## 2011-10-21 DIAGNOSIS — F10929 Alcohol use, unspecified with intoxication, unspecified: Secondary | ICD-10-CM

## 2011-10-21 DIAGNOSIS — Z933 Colostomy status: Secondary | ICD-10-CM | POA: Insufficient documentation

## 2011-10-21 DIAGNOSIS — E876 Hypokalemia: Secondary | ICD-10-CM | POA: Insufficient documentation

## 2011-10-21 DIAGNOSIS — F1010 Alcohol abuse, uncomplicated: Secondary | ICD-10-CM | POA: Insufficient documentation

## 2011-10-21 DIAGNOSIS — T50902A Poisoning by unspecified drugs, medicaments and biological substances, intentional self-harm, initial encounter: Secondary | ICD-10-CM

## 2011-10-21 HISTORY — DX: Depression, unspecified: F32.A

## 2011-10-21 HISTORY — DX: Major depressive disorder, single episode, unspecified: F32.9

## 2011-10-21 LAB — COMPREHENSIVE METABOLIC PANEL
ALT: 21 U/L (ref 0–35)
AST: 28 U/L (ref 0–37)
Albumin: 3.5 g/dL (ref 3.5–5.2)
Alkaline Phosphatase: 102 U/L (ref 39–117)
BUN: 9 mg/dL (ref 6–23)
CO2: 24 mEq/L (ref 19–32)
Calcium: 9.2 mg/dL (ref 8.4–10.5)
Chloride: 103 mEq/L (ref 96–112)
Creatinine, Ser: 0.74 mg/dL (ref 0.50–1.10)
GFR calc Af Amer: >90 mL/min (ref >90)
GFR calc non Af Amer: >90 mL/min (ref >90)
Glucose, Bld: 100 mg/dL — ABNORMAL HIGH (ref 70–99)
Potassium: 2.8 mEq/L — ABNORMAL LOW (ref 3.5–5.1)
Sodium: 140 mEq/L (ref 135–145)
Total Bilirubin: 0.3 mg/dL (ref 0.3–1.2)
Total Protein: 6.6 g/dL (ref 6.0–8.3)

## 2011-10-21 LAB — ACETAMINOPHEN LEVEL: Acetaminophen (Tylenol), Serum: <15 ug/mL (ref 10–30)

## 2011-10-21 LAB — ETHANOL: Alcohol, Ethyl (B): 255 mg/dL — ABNORMAL HIGH (ref 0–11)

## 2011-10-21 LAB — CBC
HCT: 36.2 % (ref 36.0–46.0)
Hemoglobin: 11.9 g/dL — ABNORMAL LOW (ref 12.0–15.0)
MCH: 32 pg (ref 26.0–34.0)
MCHC: 32.9 g/dL (ref 30.0–36.0)
MCV: 97.3 fL (ref 78.0–100.0)
Platelets: 335 10*3/uL (ref 150–400)
RBC: 3.72 MIL/uL — ABNORMAL LOW (ref 3.87–5.11)
RDW: 14.2 % (ref 11.5–15.5)
WBC: 6.5 10*3/uL (ref 4.0–10.5)

## 2011-10-21 LAB — RAPID URINE DRUG SCREEN, HOSP PERFORMED
Amphetamines: NOT DETECTED
Barbiturates: NOT DETECTED
Benzodiazepines: NOT DETECTED
Cocaine: POSITIVE — AB
Opiates: POSITIVE — AB
Tetrahydrocannabinol: NOT DETECTED

## 2011-10-21 LAB — SALICYLATE LEVEL: Salicylate Lvl: <2 mg/dL — ABNORMAL LOW (ref 2.8–20.0)

## 2011-10-21 MED ORDER — POTASSIUM CHLORIDE CRYS ER 20 MEQ PO TBCR
40.0000 meq | EXTENDED_RELEASE_TABLET | Freq: Once | ORAL | Status: AC
  Administered 2011-10-21: 40 meq via ORAL
  Filled 2011-10-21 (×2): qty 2

## 2011-10-21 MED ORDER — ACETAMINOPHEN 325 MG PO TABS: 650.0000 mg | ORAL_TABLET | ORAL | Status: DC | PRN

## 2011-10-21 MED ORDER — ZOLPIDEM TARTRATE 5 MG PO TABS: 5.0000 mg | ORAL_TABLET | Freq: Every evening | ORAL | Status: DC | PRN

## 2011-10-21 MED ORDER — NALOXONE HCL 1 MG/ML IJ SOLN
INTRAMUSCULAR | Status: AC
  Administered 2011-10-21: 2 mg via NASAL
  Filled 2011-10-21: qty 4

## 2011-10-21 MED ORDER — POTASSIUM CHLORIDE CRYS ER 20 MEQ PO TBCR
40.0000 meq | EXTENDED_RELEASE_TABLET | Freq: Once | ORAL | Status: AC
  Administered 2011-10-21: 40 meq via ORAL
  Filled 2011-10-21: qty 2

## 2011-10-21 MED ORDER — HYDROCHLOROTHIAZIDE 25 MG PO TABS
25.0000 mg | ORAL_TABLET | Freq: Every day | ORAL | Status: DC
  Administered 2011-10-21: 25 mg via ORAL
  Filled 2011-10-21: qty 1

## 2011-10-21 MED ORDER — GABAPENTIN 300 MG PO CAPS
300.0000 mg | ORAL_CAPSULE | Freq: Two times a day (BID) | ORAL | Status: DC
  Administered 2011-10-21: 300 mg via ORAL
  Filled 2011-10-21 (×2): qty 1

## 2011-10-21 MED ORDER — NICOTINE 21 MG/24HR TD PT24
21.0000 mg | MEDICATED_PATCH | Freq: Every day | TRANSDERMAL | Status: DC
  Filled 2011-10-21: qty 1

## 2011-10-21 MED ORDER — CITALOPRAM HYDROBROMIDE 10 MG PO TABS: 10.0000 mg | ORAL_TABLET | Freq: Every day | ORAL | Status: DC

## 2011-10-21 NOTE — ED Notes (Signed)
Pt inf faxed and tele psych called. Expecting call back within an hour.

## 2011-10-21 NOTE — BH Assessment (Signed)
Assessment Note   [name] is an 56 y.o. female found by her sister unresponsive. The paramedics arrived to assist patient with medical treatment. EMS found the patient unresponsive with pinpoint pupils on the sofa in her home. Per ED notes, on arrival to the emergency department the patient is without complaints. EDP noted that she did admit to drinking alcohol as she also admitted during the [facility] assessment. She does not remember taking any pills. Sts, "I must have taken the remainder of my pills but I don't remember doing this to myself". ED notes state that patient suspects taking 20-25 pills. Patient's bottle of Vicoprofen was found to be empty. Patient denies previously feeling suicidal and has no mental health history. No suicidal thoughts at this time and patient stating that she is able to contract for safety. Patient admits to on-going and depression. Her depression is a result of her feeling sick. Patient sts, "I'm sick of being sick". 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  She denies homicidal ideations.  No AVH's. Patient reports times of excessive alcohol use and other times she will not drink for weeks at a time. She admits to drinking 1pint of liqour last night. Sts that she doesn't typically drink that much at one time. She also admits to cocaine use. She last used last night. Pt unable to provide amt of use however sts that she rarely uses cocaine.   Patient pending a telepsych consult to assist with determining her disposition. Telepsych initiated by [facility] staff.   Axis I: Major Depressive Disorder, Single Episode, without Psychotic Features. Axis II: Deferred Axis III:  Past Medical History  Diagnosis Date  . 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 Hypertension   . Diverticulosis   . Radiation proctitis     mild  . Adenomatous colon polyp   .  Anemia, unspecified   . 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 Depression    Axis IV: other psychosocial or environmental problems, problems related to social environment, problems with access to health care services and problems with primary support group Axis V: 31-40 impairment in reality testing  Past Medical History:  Past Medical History  Diagnosis Date  . 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 Hypertension   . Diverticulosis   . Radiation proctitis     mild  . Adenomatous colon polyp   . Anemia, unspecified   . 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 Depression     Past Surgical History  Procedure Date  . Mouth surgery 10/2009  . Low anterior bowel resection 03/07/09  . Colostomy     Family History:  Family History  Problem Relation Age of Onset  . Colon cancer Mother   . Stomach cancer Sister   . Stomach cancer Sister   . Colon polyps Sister   . Diabetes Father   . Cirrhosis Brother     Social History:  reports that she has been smoking Cigarettes.  She has been smoking about .3 packs per day. She has never used smokeless tobacco. She reports that she drinks alcohol. She reports that she does not use illicit drugs.  Additional Social History:  Alcohol / Drug Use Pain Medications: SEE MAR and/or [facility] notes  Prescriptions: SEE MAR and/or [facility] notes Over the Counter: SEE MAR and/or [facility] notes History of alcohol / drug use?: Yes Substance #1 Name of Substance 1: Alcohol 1 - Age of First Use: "5 or 56 yrs old" 1 - Amount (size/oz): varies; "anywhere from a little bit to 1 pint" 1 - Frequency: varies; "some weeks I don't drink at all and others I drink every day" 1 - Duration: on-going  1 - Last Use / Amount: last night 10/20/2011; 1 pint Substance #2 Name of Substance 2: Cocaine 2 - Age of First Use: "I don't recall" 2 - Amount (size/oz): varies; "I just really can't say .Marland KitchenMarland KitchenI don't know" 2 -  Frequency:  "I don't really use like that and I can't even remember the last time I used before last night" 2 - Duration: on-going 2 - Last Use / Amount: last night 10/20/2011; pt unable to recall amt of use last night Substance #3 Name of Substance 3: THC, per nurses notes. Patient denies THC use. UDS is also negative. 3 - Age of First Use: [name] 3 - Amount (size/oz): [name] 3 - Frequency: [name] 3 - Duration: [name] 3 - Last Use / Amount: [name]  CIWA: CIWA-Ar BP: 107/72 mmHg Pulse Rate: 78  COWS:    Allergies:  Allergies  Allergen Reactions  . Ampicillin Hives  . Hydrocodone Itching    Home Medications:  (Not in a hospital admission)  OB/GYN Status:  No LMP recorded. Patient is postmenopausal.  General Assessment Data Location of Assessment: [facility] Living Arrangements: Other (Comment);Other relatives (lives with sister) Can pt return to current living arrangement?: No Admission Status: Involuntary Is patient capable of signing voluntary admission?: Yes Transfer from: Acute Hospital Referral Source: Self/Family/Friend  Education Status Is patient currently in school?: No  Risk to self Suicidal Ideation: No Suicidal Intent: No (OD, drank 1 pint of liqour, used cocaine-denies intentional) Is patient at risk for suicide?: No Suicidal Plan?: No Access to Means: No What has been your use of drugs/alcohol within the last 12 months?:  (cocaine and alcohol; THC also noted in nurses notes) Previous Attempts/Gestures: No How many times?:  (no previous attempts per patient) Other Self Harm Risks:  (none reported) Triggers for Past Attempts: None known Intentional Self Injurious Behavior: None Family Suicide History: No Recent stressful life event(s): Other (Comment) (medical complications (pt reports having cancer)) Persecutory voices/beliefs?: No Depression: Yes Depression Symptoms: Feeling worthless/self pity;Loss of interest in usual  pleasures;Fatigue;Isolating;Tearfulness;Despondent;Insomnia Substance abuse history and/or treatment for substance abuse?: No Suicide prevention information given to non-admitted patients: Not applicable  Risk to Others Homicidal Ideation: No Thoughts of Harm to Others: No Current Homicidal Intent: No Current Homicidal Plan: No Access to Homicidal Means: No Identified Victim:  ([name]) History of harm to others?: No Assessment of Violence: None Noted Violent Behavior Description:  (patient has remained calm and cooperative ) Does patient have access to weapons?: No Criminal Charges Pending?: No Does patient have a court date: No  Psychosis Hallucinations: None noted Delusions: None noted  Mental Status Report Appear/Hygiene: Disheveled Eye Contact: Good Motor Activity: Freedom of movement Speech: Logical/coherent Level of Consciousness: Alert Mood: Depressed Affect: Appropriate to circumstance Anxiety Level: Minimal Thought Processes: Coherent Judgement: Impaired Orientation: Person;Place;Time;Situation Obsessive Compulsive Thoughts/Behaviors: None  Cognitive Functioning Concentration: Normal Memory: Recent Intact;Remote Intact IQ: Average Insight: Poor Impulse Control: Fair Appetite: Good Weight Loss:  ([name]) Weight Gain:  ([name]) Sleep: Decreased Total Hours of Sleep:  ("I sleep for a few hour wake up  and then I'm up for hrs") Vegetative Symptoms: None  ADLScreening [facility] Assessment Services) Patient's cognitive ability adequate to safely complete daily activities?: Yes Patient able to express need for assistance with ADLs?: Yes Independently performs ADLs?: Yes  Abuse/Neglect [facility]) Physical Abuse: Denies Verbal Abuse: Denies Sexual Abuse: Denies  Prior Inpatient Therapy Prior Inpatient Therapy: No Prior Therapy Dates:  ([name]) Prior Therapy Facilty/Provider(s):  ([name]) Reason for Treatment:  ([name])  Prior Outpatient Therapy Prior Outpatient Therapy: No Prior  Therapy Dates:  ([name]) Prior Therapy Facilty/Provider(s):  ([name]) Reason for Treatment:  ([name])  ADL Screening (condition at time of admission) Patient's cognitive ability adequate to safely complete daily activities?: Yes Patient able to express need for assistance with ADLs?: Yes Independently performs ADLs?: Yes Weakness of Legs: None Weakness of Arms/Hands: None  Home Assistive Devices/Equipment Home Assistive Devices/Equipment: None    Abuse/Neglect Assessment (Assessment to be complete while patient is alone) Physical Abuse: Denies Verbal Abuse: Denies Sexual Abuse: Denies Exploitation of patient/patient's resources: Denies Self-Neglect: Denies Values / Beliefs Cultural Requests During Hospitalization: None Spiritual Requests During Hospitalization: None     Nutrition Screen Diet: Regular  Additional Information 1:1 In Past 12 Months?: No CIRT Risk: No Elopement Risk: No Does patient have medical clearance?: Yes     Disposition:  Disposition Disposition of Patient: Other dispositions (Disposition pending telepsych consult (telepsych  initiated))  On Site Evaluation by:   Reviewed with Physician:     Octaviano Batty 10/21/2011 12:20 PM

## 2011-10-21 NOTE — ED Notes (Addendum)
Sister just called to inform me that pt may have taken her Vicoprofen 7.5 - 200 mg. Sister states she had at least 30+ pills but now they're all gone. Dr. Patria Mane made aware

## 2011-10-21 NOTE — ED Notes (Signed)
Pt's DC complete, awaiting outpatient counseling info from ACT team prior to pt leaving, will monitor.

## 2011-10-21 NOTE — ED Notes (Signed)
Dr. Patria Mane made aware of pt unable to take PO K+

## 2011-10-21 NOTE — ED Provider Notes (Signed)
Patient reassessed at 1600. She is calm cooperative in no distress. She denies SI or HI. She is not recall events of last night. The chart he had overdosed on unknown pills and alcohol. Her tox labs were unremarkable other than her elevated alcohol level. Her hypokalemia has been treated. Psychiatry consult is complete and patient demonstrated no suicidal or homicidal thoughts and was thought to be stable for discharge. BP 99/55  Pulse 70  Temp 98 F (36.7 C) (Oral)  Resp 16  SpO2 98%   Glynn Octave, MD 10/21/11 1553

## 2011-10-21 NOTE — ED Provider Notes (Addendum)
History     CSN: [id number]  Arrival date & time 10/21/11  0408   First MD Initiated Contact with Patient 10/21/11 [phone]      Chief Complaint  Patient presents with  . Drug Overdose    (Consider location/radiation/quality/duration/timing/severity/associated sxs/prior treatment) The history is provided by the patient and the EMS personnel. History Limited By: Level V caveat: Intoxication.   as reported that the patient presented from home after drinking alcohol and taking unknown amount of prescription medication in attempt to herself.  As reported that she then called family members and his sister called the paramedics.  EMS found the patient unresponsive with pinpoint pupils on the sofa and EMS gave 2 mg of intranasal Narcan with increased alertness.  On arrival to the emergency department the patient is without complaints.  She does admit to drinking alcohol.  She does not remember taking any pills.  She denies homicidal or suicidal thoughts at this time.  She does report a history of rectosigmoid cancer and states she is having issues dealing with her new colostomy as she feels as though she is not herself with it.   Past Medical History  Diagnosis Date  . 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 Hypertension   . Diverticulosis   . Radiation proctitis     mild  . Adenomatous colon polyp   . Anemia, unspecified   . 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 Mouth surgery 10/2009  . Low anterior bowel resection 03/07/09  . Colostomy     Family History  Problem Relation Age of Onset  . Colon cancer Mother   . Stomach cancer Sister   . Stomach cancer Sister   . Colon polyps Sister   . Diabetes Father   . Cirrhosis Brother     History  Substance Use Topics  . Smoking status: Current Everyday Smoker -- 0.3 packs/day    Types: Cigarettes  . Smokeless tobacco: Never Used   Comment: divorced, lives  with sister Meryl Dare. Food Service at [employer]  . Alcohol Use: No      one can domestic beer per night    OB History    Grav Para Term Preterm Abortions TAB SAB Ect Mult Living                  Review of Systems  Unable to perform ROS   Allergies  Ampicillin and Hydrocodone  Home Medications   Current Outpatient Rx  Name Route Sig Dispense Refill  . CIPROFLOXACIN HCL 500 MG PO TABS Oral Take 1 tablet (500 mg total) by mouth 3 (three) times daily. 20 tablet 0  . GABAPENTIN 100 MG PO CAPS Oral Take 300 mg by mouth 2 (two) times daily.    [name] HYDROCHLOROTHIAZIDE 25 MG PO TABS Oral Take 1 tablet (25 mg total) by mouth daily. 90 tablet 2  . METRONIDAZOLE 500 MG PO TABS Oral Take 1 tablet (500 mg total) by mouth 3 (three) times daily. 30 tablet 0  . OXYCODONE-ACETAMINOPHEN 5-325 MG PO TABS Oral Take 1 tablet by mouth every 12 (twelve) hours as needed for pain. 60 tablet 0    BP 118/70  Pulse 83  Temp 98 F (36.7 C) (Oral)  Resp 16  SpO2 85%  Physical Exam  Nursing note and vitals reviewed. Constitutional: She appears well-developed and well-nourished. No distress.  HENT:  Head: Normocephalic and atraumatic.  Eyes: EOM are normal.  Neck: Normal range of motion.  Cardiovascular: Normal rate, regular rhythm and normal heart sounds.   Pulmonary/Chest: Effort normal and breath sounds normal.  Abdominal: Soft. She exhibits no distension. There is no tenderness.  Musculoskeletal: Normal range of motion.  Neurological: She is alert.  Skin: Skin is warm and dry.  Psychiatric: She has a normal mood and affect. Judgment normal.    ED Course  Procedures (including critical care time)  Labs Reviewed  CBC - Abnormal; Notable for the following:    RBC 3.72 (*)     Hemoglobin 11.9 (*)     All other components within normal limits  COMPREHENSIVE METABOLIC PANEL - Abnormal; Notable for the following:    Potassium 2.8 (*)     Glucose, Bld 100 (*)     All  other components within normal limits  ETHANOL - Abnormal; Notable for the following:    Alcohol, Ethyl (B) 255 (*)     All other components within normal limits  URINE RAPID DRUG SCREEN (HOSP PERFORMED) - Abnormal; Notable for the following:    Opiates POSITIVE (*)     Cocaine POSITIVE (*)     All other components within normal limits  ACETAMINOPHEN LEVEL  SALICYLATE LEVEL   No results found.   No diagnosis found.    MDM  The patient is intoxicated this time.  She denies homicidal or suicidal thoughts at this time.  It'll be nice to discuss this further with the patient when she is more sober.  We'll continue to monitor the patient emergency department.   Lyanne Co, MD 10/21/11 [phone]  7:25 AM The nursing team spoke with the patient's sister who went to her house and noted that her bottle of Vicoprofen was empty.  She suspects that there were 20-25 missing.  Discussed the case with the act team who will by with the patient emergency department when she becomes more sober  Lyanne Co, MD 10/21/11 [phone]

## 2011-10-21 NOTE — ED Notes (Signed)
MD (Dr. Manus Gunning) at bedside, pt for possible DC home after tele psych, awaiting further orders.

## 2011-10-21 NOTE — ED Notes (Signed)
Per EMS: pt coming from home with c/o drug overdose. Pt took unknown amount of prescription medication in an attempt to hurt herself. She then called son or nephew. Sister who lives with pt called EMS. EMS found pt on sofa unresponsive with pin point pupils, EMS gave 2 mg of narcan intranasally. Pt is now A&O with no complaints.

## 2011-10-21 NOTE — ED Notes (Signed)
ACT team at bedside.  

## 2011-10-21 NOTE — ED Notes (Signed)
ZOX:[id number]<AV> Expected date:<BR> Expected time:<BR> Means of arrival:<BR> Comments:<BR> Suicidal overdose

## 2011-10-23 ENCOUNTER — Other Ambulatory Visit: Payer: Self-pay | Admitting: *Deleted

## 2011-10-23 ENCOUNTER — Encounter: Payer: Self-pay | Admitting: *Deleted

## 2011-10-23 DIAGNOSIS — G629 Polyneuropathy, unspecified: Secondary | ICD-10-CM

## 2011-10-23 DIAGNOSIS — Z85048 Personal history of other malignant neoplasm of rectum, rectosigmoid junction, and anus: Secondary | ICD-10-CM

## 2011-10-23 MED ORDER — OXYCODONE-ACETAMINOPHEN 5-325 MG PO TABS: 1.0000 | ORAL_TABLET | Freq: Two times a day (BID) | ORAL | Status: DC | PRN

## 2011-10-23 NOTE — Telephone Encounter (Signed)
Rx for Percocet left in injection room book for pt to pick up.

## 2011-10-27 ENCOUNTER — Telehealth: Payer: Self-pay | Admitting: *Deleted

## 2011-10-27 NOTE — Telephone Encounter (Signed)
Patient referred by Dr. Gaylyn Rong to smoking cessation classes. Danelle Berry Smoking Cessation class facilitator called patient 10/27/11 to let patient know about our smoking cessation program at the [facility]. Patient signed up for the next class that starts on December 16, 2011. Patient given details of classes and told patient will call a few days prior to remind. Patient verbalized understanding.

## 2011-11-11 ENCOUNTER — Ambulatory Visit (INDEPENDENT_AMBULATORY_CARE_PROVIDER_SITE_OTHER): Payer: Medicare Other | Admitting: Internal Medicine

## 2011-11-11 ENCOUNTER — Ambulatory Visit: Payer: Medicare Other | Admitting: Internal Medicine

## 2011-11-11 ENCOUNTER — Encounter: Payer: Self-pay | Admitting: Internal Medicine

## 2011-11-11 VITALS — BP 102/70 | HR 64 | Ht 66.0 in | Wt 141.2 lb

## 2011-11-11 DIAGNOSIS — K469 Unspecified abdominal hernia without obstruction or gangrene: Secondary | ICD-10-CM | POA: Diagnosis not present

## 2011-11-11 DIAGNOSIS — K5289 Other specified noninfective gastroenteritis and colitis: Secondary | ICD-10-CM

## 2011-11-11 DIAGNOSIS — K435 Parastomal hernia without obstruction or  gangrene: Secondary | ICD-10-CM

## 2011-11-11 DIAGNOSIS — R197 Diarrhea, unspecified: Secondary | ICD-10-CM | POA: Diagnosis not present

## 2011-11-11 DIAGNOSIS — R933 Abnormal findings on diagnostic imaging of other parts of digestive tract: Secondary | ICD-10-CM

## 2011-11-11 DIAGNOSIS — K529 Noninfective gastroenteritis and colitis, unspecified: Secondary | ICD-10-CM

## 2011-11-11 MED ORDER — METRONIDAZOLE 250 MG PO TABS: 250.0000 mg | ORAL_TABLET | Freq: Three times a day (TID) | ORAL | Status: DC

## 2011-11-11 MED ORDER — MOVIPREP 100 G PO SOLR: ORAL | Status: DC

## 2011-11-11 NOTE — Patient Instructions (Addendum)
You have been given a separate informational sheet regarding your tobacco use, the importance of quitting and local resources to help you quit. You have been scheduled for a colonoscopy with propofol. Please follow written instructions given to you at your visit today.  Please pick up your prep kit at the pharmacy within the next 1-3 days. If you use inhalers (even only as needed), please bring them with you on the day of your procedure. We have sent the following medications to your pharmacy for you to pick up at your convenience: Flagyl-3 times daily x 14 days. It is very important that you take the prescription until ALL tablets are gone. Your physician has requested that you go to the basement for the following lab work before leaving today: Stool for C Diff. CC: Dr Rene Paci, Dr Gaylyn Rong

## 2011-11-11 NOTE — Progress Notes (Signed)
[name] [birth year] MRN [id number]   History of Present Illness:  This is a 56 year old African American female with a parastomal hernia and colitis on a CT scan of the abdomen in June 2013 showing marked thickening of the transverse colon leading to the colostomy with associated pericolic stranding consistent with nonspecific colitis. Diverticulitis could not be ruled out. She was treated with Cipro and Flagyl. She has a history of adenocarcinoma of the rectum T2N1M0, status post radiation, chemotherapy and anterior resection in November 2010 resulting in fecal incontinence.She subsequently underwent  diverting transverse colostomy at [facility] in January 2013.which solved the problem of soiling, leakage and incontinence. I saw her in February 2013 for removal of the catheter bridge which was keeping the colostomy open. She was recently seen in the emergency room on 10/21/2011 with alcohol intoxication and positive cocaine and opiates in the blood. She is complaining of loose stools and drainage. The smell to her stool is suggestive of  C. difficile colitis. She denies fever or abdominal pain. She denies any significant rectal drainage. Her weight has been stable.  Past Medical History  Diagnosis Date  . 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 Hypertension   . Diverticulosis   . Radiation proctitis     mild  . Adenomatous colon polyp   . Anemia, unspecified   . 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 Depression   . Nonspecific colitis 10/10/11  . Cocaine abuse     as recently as 10/21/11   Past Surgical History  Procedure Date  . Mouth surgery 10/2009  . Low anterior bowel resection 03/07/09  . Colostomy     reports that she has been smoking Cigarettes.  She has been smoking about .3 packs per day. She has never used smokeless tobacco. She reports that she drinks alcohol. She reports that she uses illicit drugs (Marijuana and Cocaine). family history  includes Cirrhosis in her brother; Colon cancer in her mother; Colon polyps in her sister; Diabetes in her father; and Stomach cancer in her sisters. Allergies  Allergen Reactions  . Ampicillin Hives  . Hydrocodone Itching  . Penicillins         Review of Systems: Denies heartburn dysphagia odynophagia or abdominal pain, positive for diarrhea  The remainder of the 10 point ROS is negative except as outlined in H&P   Physical Exam: General appearance  Well developed, in no distress. Eyes- non icteric. HEENT nontraumatic, normocephalic. Mouth no lesions, tongue papillated, no cheilosis. Neck supple without adenopathy, thyroid not enlarged, no carotid bruits, no JVD. Lungs Clear to auscultation bilaterally. Cor normal S1, normal S2, regular rhythm, no murmur,  quiet precordium. Abdomen: Protuberant with large peristomal hernia in the upper abdomen. The stoma appears healthy. There is a small amount of dermatitis under the appliance. The stool is Hemoccult negative. Digital exam of the stoma shows colostomy to be open. The hernia is easily reducible when patient lays down. Rectal: Not done. Extremities no pedal edema. Skin no lesions. Neurological alert and oriented x 3. Psychological normal mood and affect.  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 Patient will apply an abdominal binder of some type for reduction of the hernia.  Problem #2 Question of recurrent C. difficile colitis. Nonspecific colitis was found on a CT scan of the abdomen 2 months ago. Stool now smells like C.  difficile again. We will start her on Flagyl 250 mg by mouth 3 times a day and check the stool for C. Difficile. We will also set her up for a colonoscopy to rule out ischemic colitis versus infectious colitis versus diverticulitis.  Problem #3 History of adenocarcinoma of the rectum for which patient is status post  resection and diverting transverse colostomy. Patient is cancer free at this point.  Problem #4 radiation proctitis- has not bee na problem since the diverting colostomy has been functioning.  Problem #5 Mild anemia with a hemoglobin of 11.9, hematocrit of 36.7. Her albumin was 3.5 on recent blood tests. She is Hemoccult-negative.  Problem #6  Drug abuse; specifically alcohol and cocaine found in the blood on recent ER visit on 10/21/2011.    11/11/2011 [name]

## 2011-11-12 ENCOUNTER — Encounter: Payer: Self-pay | Admitting: Internal Medicine

## 2011-11-12 ENCOUNTER — Other Ambulatory Visit: Payer: Medicare Other

## 2011-11-12 ENCOUNTER — Ambulatory Visit (INDEPENDENT_AMBULATORY_CARE_PROVIDER_SITE_OTHER): Payer: Medicare Other | Admitting: Internal Medicine

## 2011-11-12 VITALS — BP 120/80 | HR 77 | Temp 98.4°F | Ht 66.0 in | Wt 147.2 lb

## 2011-11-12 DIAGNOSIS — G589 Mononeuropathy, unspecified: Secondary | ICD-10-CM

## 2011-11-12 DIAGNOSIS — Z85048 Personal history of other malignant neoplasm of rectum, rectosigmoid junction, and anus: Secondary | ICD-10-CM | POA: Diagnosis not present

## 2011-11-12 DIAGNOSIS — F329 Major depressive disorder, single episode, unspecified: Secondary | ICD-10-CM | POA: Diagnosis not present

## 2011-11-12 DIAGNOSIS — F32A Depression, unspecified: Secondary | ICD-10-CM | POA: Insufficient documentation

## 2011-11-12 DIAGNOSIS — K529 Noninfective gastroenteritis and colitis, unspecified: Secondary | ICD-10-CM

## 2011-11-12 MED ORDER — CITALOPRAM HYDROBROMIDE 10 MG PO TABS: 10.0000 mg | ORAL_TABLET | Freq: Every day | ORAL | Status: DC

## 2011-11-12 NOTE — Patient Instructions (Signed)
It was good to see you today. We have reviewed your interval records including labs and tests today Medications reviewed, no changes at this time. Refill on medication(s) as discussed today. Call if you need a referral to individual counseling  Please schedule followup in 3-6 months to review depression and medications, call sooner if problems.

## 2011-11-12 NOTE — Progress Notes (Signed)
Subjective:    Patient ID: [name], female    DOB: Sep 30, 1955, 56 y.o.   MRN: [id number]  HPI  Here for follow up - 10/21/11 hospitalization for unintentional OD with EtOH and muscle relaxers in setting of marked depression about colostomy and cancer hx   Denies SI/HI, then and now Started citalopram and reaching out to church and family (sister) - feels like she is improved  Also reviewed chronic medical issues:  colon ca dx 10/2008 - s/p low ant resection 02/2009 and chemo thru 09/26/09 - f/u colo has been unremarkable - residual constip alt with severe diarrhea - takes laxatives then immodium respectively - occ rectal pain and soreness, relieved with oxy prn colo 06/2010 reviewed: normal path - f/u 12/2011 due to colitis on CT  neuropathy - chemo induced (oxaliplatin) - primarily affects hands and feet - intol of lyrica due to exac of GI signs - remains on percocet 2x/d for control of same - gabapentin started 04/2010 - improving  HTN - reports compliance with ongoing medical treatment and no changes in medication dose or frequency. denies adverse side effects related to current therapy. no edema, chest pain or headache   Past Medical History  Diagnosis Date  . 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 Hypertension   . Diverticulosis   . Radiation proctitis     mild  . Adenomatous colon polyp   . Anemia, unspecified   . 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 Depression   . Nonspecific colitis 10/10/11  . Cocaine abuse     as recently as 10/21/11    Review of Systems  Constitutional: Negative for fever and fatigue.  HENT: Negative for facial swelling.   Respiratory: Negative for shortness of breath.   Cardiovascular: Negative for chest pain and palpitations.  Gastrointestinal: Positive for diarrhea (chronic) and rectal pain. Negative for vomiting.  Psychiatric/Behavioral: Negative for suicidal ideas, behavioral problems, confusion, disturbed  wake/sleep cycle, dysphoric mood and decreased concentration. The patient is not nervous/anxious.        Objective:   Physical Exam BP 120/80  Pulse 77  Temp 98.4 F (36.9 C) (Oral)  Ht 5\' 6"  (1.676 m)  Wt 147 lb 3.2 oz (66.769 kg)  BMI 23.76 kg/m2  SpO2 97% Wt Readings from Last 3 Encounters:  11/12/11 147 lb 3.2 oz (66.769 kg)  11/11/11 141 lb 3.2 oz (64.048 kg)  10/13/11 144 lb 9.6 oz (65.59 kg)   Constitutional: She appears well-developed and well-nourished. No distress.  Neck: Normal range of motion. Neck supple. No JVD present. No thyromegaly present.  Cardiovascular: Normal rate, regular rhythm and normal heart sounds.  No murmur heard. No BLE edema. Pulmonary/Chest: Effort normal and breath sounds normal. No respiratory distress. She has no wheezes.  Abdominal:  Colostomy bag intact across transverse colostomy site (mid anterior upper abdomen) - Soft. Bowel sounds are normal. She exhibits no distension. There is no tenderness. no masses Skin: Skin is warm and dry. No rash noted. No erythema.  Psychiatric: She has a normal mood and affect. Her behavior is normal. Judgment and thought content normal.   Lab Results  Component Value Date   WBC 6.5 10/21/2011   HGB 11.9* 10/21/2011   HCT 36.2 10/21/2011   PLT 335 10/21/2011   GLUCOSE 100* 10/21/2011   CHOL 168 05/30/2010   TRIG 354.0* 05/30/2010   HDL 53.10 05/30/2010   LDLDIRECT 78.9 05/30/2010  ALT 21 10/21/2011   AST 28 10/21/2011   NA 140 10/21/2011   K 2.8* 10/21/2011   CL 103 10/21/2011   CREATININE 0.74 10/21/2011   BUN 9 10/21/2011   CO2 24 10/21/2011   TSH 0.53 05/30/2010   INR 0.97 10/17/2009        Assessment & Plan:  See problem list. Medications and labs reviewed today.  Time spent with pt today 25 minutes, greater than 50% time spent counseling patient on depression and medication review. Also review of interval records and support

## 2011-11-12 NOTE — Assessment & Plan Note (Signed)
Hands/feet - related to prior chemo  Started gabapentin 2012 for same - titrate up as needed, but declines need for change at this time Also uses Oxy prn neuro pain < rectal pain

## 2011-11-12 NOTE — Assessment & Plan Note (Signed)
Hospitalization for same with unintentional overdose 10/2011 Started citalopram and improved - continue same [facility] or individual therapy, but will call if needs change Support offered today

## 2011-11-14 LAB — CLOSTRIDIUM DIFFICILE BY PCR: Toxigenic C. Difficile by PCR: NOT DETECTED

## 2011-11-24 ENCOUNTER — Ambulatory Visit: Payer: Medicare Other

## 2011-11-24 NOTE — Progress Notes (Signed)
Patient missed appointment.  Called and left message on machine with phone number to call to reschedule.

## 2011-11-28 NOTE — Progress Notes (Signed)
Patient called to request refill on Percocet; patient needs to sign a Pain Contract, per Dr. Gaylyn Rong to receive future refills; attempted to contact patient [phone]) several times with no response.

## 2011-12-01 ENCOUNTER — Telehealth: Payer: Self-pay | Admitting: *Deleted

## 2011-12-01 NOTE — Telephone Encounter (Signed)
Pt called about request for refill on percocet.  Informed her of note from desk nurse stating Dr. Gaylyn Rong wants pt to sign a pain contract.   Dr. Gaylyn Rong not in office today and Clenton Pare, NP says she is not familiar w/ pt or pain contract and instructs for Dr. Gaylyn Rong to address this tomorrow.   I called pt back and she says she is willing to sign pain contract.  She has appt for PAC flush tomorrow morning.  Instructed her to ask for desk nurse tomorrow after her flush appt and I will ask Dr. Gaylyn Rong about Rx.   Copy of pain contract in nurse's folder at desk.   Pt verbalized understanding.

## 2011-12-02 ENCOUNTER — Other Ambulatory Visit: Payer: Self-pay

## 2011-12-02 ENCOUNTER — Ambulatory Visit (HOSPITAL_BASED_OUTPATIENT_CLINIC_OR_DEPARTMENT_OTHER): Payer: Medicare Other

## 2011-12-02 VITALS — BP 116/86 | HR 56 | Temp 98.5°F

## 2011-12-02 DIAGNOSIS — Z452 Encounter for adjustment and management of vascular access device: Secondary | ICD-10-CM | POA: Diagnosis not present

## 2011-12-02 DIAGNOSIS — Z85048 Personal history of other malignant neoplasm of rectum, rectosigmoid junction, and anus: Secondary | ICD-10-CM

## 2011-12-02 DIAGNOSIS — G629 Polyneuropathy, unspecified: Secondary | ICD-10-CM

## 2011-12-02 DIAGNOSIS — C20 Malignant neoplasm of rectum: Secondary | ICD-10-CM

## 2011-12-02 MED ORDER — OXYCODONE-ACETAMINOPHEN 5-325 MG PO TABS: 1.0000 | ORAL_TABLET | Freq: Two times a day (BID) | ORAL | Status: DC | PRN

## 2011-12-02 MED ORDER — SODIUM CHLORIDE 0.9 % IJ SOLN
10.0000 mL | INTRAMUSCULAR | Status: DC | PRN
  Administered 2011-12-02: 10 mL via INTRAVENOUS
  Filled 2011-12-02: qty 10

## 2011-12-02 MED ORDER — HEPARIN SOD (PORK) LOCK FLUSH 100 UNIT/ML IV SOLN
500.0000 [IU] | Freq: Once | INTRAVENOUS | Status: AC
  Administered 2011-12-02: 500 [IU] via INTRAVENOUS
  Filled 2011-12-02: qty 5

## 2011-12-02 NOTE — Patient Instructions (Signed)
Call MD for problems 

## 2011-12-17 ENCOUNTER — Other Ambulatory Visit: Payer: Self-pay | Admitting: *Deleted

## 2011-12-17 MED ORDER — CITALOPRAM HYDROBROMIDE 10 MG PO TABS: 10.0000 mg | ORAL_TABLET | Freq: Every day | ORAL | Status: DC

## 2011-12-17 NOTE — Telephone Encounter (Signed)
Received fax stating pt is wanting a 90 day on her citalopram due to insurance...Raechel Chute

## 2011-12-21 NOTE — Progress Notes (Signed)
Rescheduled

## 2011-12-23 ENCOUNTER — Telehealth: Payer: Self-pay | Admitting: Internal Medicine

## 2011-12-23 NOTE — Telephone Encounter (Signed)
Pharmacy states that they did fill the prescription 11-11-11 but patient never came to get it so they put it back on the shelf. I have asked that they fill rx again for patient.

## 2011-12-23 NOTE — Telephone Encounter (Signed)
Advised patient that I will put a free prep coupon at the front desk for her.

## 2011-12-29 ENCOUNTER — Telehealth: Payer: Self-pay | Admitting: *Deleted

## 2011-12-29 NOTE — Telephone Encounter (Signed)
Pt called requests to pick up refill Rx for Percocet on Wed 12/31/11. Pt has colonoscopy scheduled that day across the street and would like to pick up Rx prior to procedure.   Notified Dr. Gaylyn Rong of request for refill and he orders for Nurse to get urine tox screen on Pt when she comes in for refill per her signed pain contract.

## 2011-12-31 ENCOUNTER — Ambulatory Visit (AMBULATORY_SURGERY_CENTER): Payer: Medicare Other | Admitting: Internal Medicine

## 2011-12-31 ENCOUNTER — Ambulatory Visit: Payer: Medicare Other

## 2011-12-31 ENCOUNTER — Encounter: Payer: Self-pay | Admitting: Internal Medicine

## 2011-12-31 ENCOUNTER — Other Ambulatory Visit: Payer: Self-pay | Admitting: *Deleted

## 2011-12-31 VITALS — BP 136/85 | HR 57 | Temp 98.4°F | Resp 29 | Ht 66.0 in | Wt 141.0 lb

## 2011-12-31 DIAGNOSIS — Z85038 Personal history of other malignant neoplasm of large intestine: Secondary | ICD-10-CM | POA: Diagnosis not present

## 2011-12-31 DIAGNOSIS — K5289 Other specified noninfective gastroenteritis and colitis: Secondary | ICD-10-CM | POA: Diagnosis not present

## 2011-12-31 DIAGNOSIS — Z85048 Personal history of other malignant neoplasm of rectum, rectosigmoid junction, and anus: Secondary | ICD-10-CM

## 2011-12-31 DIAGNOSIS — I10 Essential (primary) hypertension: Secondary | ICD-10-CM | POA: Diagnosis not present

## 2011-12-31 DIAGNOSIS — C189 Malignant neoplasm of colon, unspecified: Secondary | ICD-10-CM | POA: Diagnosis not present

## 2011-12-31 DIAGNOSIS — R197 Diarrhea, unspecified: Secondary | ICD-10-CM | POA: Diagnosis not present

## 2011-12-31 DIAGNOSIS — D126 Benign neoplasm of colon, unspecified: Secondary | ICD-10-CM | POA: Diagnosis not present

## 2011-12-31 DIAGNOSIS — Z933 Colostomy status: Secondary | ICD-10-CM

## 2011-12-31 DIAGNOSIS — G629 Polyneuropathy, unspecified: Secondary | ICD-10-CM

## 2011-12-31 DIAGNOSIS — R52 Pain, unspecified: Secondary | ICD-10-CM | POA: Diagnosis not present

## 2011-12-31 MED ORDER — OXYCODONE-ACETAMINOPHEN 5-325 MG PO TABS: 1.0000 | ORAL_TABLET | Freq: Two times a day (BID) | ORAL | Status: DC | PRN

## 2011-12-31 MED ORDER — SODIUM CHLORIDE 0.9 % IV SOLN: 500.0000 mL | INTRAVENOUS | Status: DC

## 2011-12-31 NOTE — Op Note (Signed)
[facility] [address] [place], [address]   COLONOSCOPY PROCEDURE REPORT  PATIENT: [name], [name]  MR#: [id number] BIRTHDATE: March 03, 1956 , 56  yrs. old GENDER: Female ENDOSCOPIST: Hart Carwin, MD REFERRED BY: PROCEDURE DATE:  12/31/2011 PROCEDURE:   Colonoscopy via stoma and biopsy ASA CLASS:   Class III INDICATIONS:an abnormal CT, 09/2011, rectal carcinoma 02/2009, s/p radiation and diverting colostomy 04/2011, CT scan in 09/2011 shows colitis in transverse colon. MEDICATIONS: MAC sedation, administered by CRNA and Propofol (Diprivan) 350 mg IV  DESCRIPTION OF PROCEDURE:   After the risks and benefits and of the procedure were explained, informed consent was obtained.  A digital rectal exam was not performed.    The LB [id number] endoscope was introduced through the anus and advanced to the cecum, which was identified by the appendix .  The quality of the prep was Moviprep fair .Pt has a double barrel colostomy, efferent limg  is 60 cm long and terminates in the rectum, biopsies were taken to r/o radfiation colitis. The afferent limb of the colon is 50 cm long and mucosa appears normal to the cecum and ileocecal valve, biopsies were taken There is nothing to suggest colitis  The instrument was then slowly withdrawn as the colon was fully examined.   Retroflexion was not performed.     The scope was then withdrawn from the patient and the procedure completed.  COMPLICATIONS: There were no complications. ENDOSCOPIC IMPRESSION: normal colonoscopy via double barrell colostomy with normal appearing efferent and afferent limbs, no evidence of colitis, random biopsies pending  Diverticulosis of the left and the right colon  RECOMMENDATIONS: colostomy care , high fiber diet observe  stomal hernia for any complications await biopsy results  REPEAT EXAM: In 3 year(s)  for Colonoscopy.  cc:  _______________________________ eSignedHart Carwin, MD 12/31/2011 3:16 PM     PATIENT NAME:  [name], [name] MR#: [id number]

## 2011-12-31 NOTE — Telephone Encounter (Signed)
Pt came in to pick up Rx for refill on Percocet.  Dr. Gaylyn Rong ordered urine drug screen prior to giving pt Rx.  Pt complied w/ request and urine specimen obtained and given to lab.  Signed Rx given to pt by this RN.   Pt just had Colonscopy this afternoon.  States she was told it was negative for any polyps or signs of cancer.

## 2011-12-31 NOTE — Progress Notes (Signed)
Patient did not experience any of the following events: a burn prior to discharge; a fall within the facility; wrong site/side/patient/procedure/implant event; or a hospital transfer or hospital admission upon discharge from the facility. (G8907) Patient did not have preoperative order for IV antibiotic SSI prophylaxis. (G8918)  

## 2011-12-31 NOTE — Patient Instructions (Addendum)

## 2012-01-01 ENCOUNTER — Telehealth: Payer: Self-pay

## 2012-01-01 ENCOUNTER — Other Ambulatory Visit: Payer: Self-pay | Admitting: Oncology

## 2012-01-01 ENCOUNTER — Telehealth: Payer: Self-pay | Admitting: *Deleted

## 2012-01-01 DIAGNOSIS — Z85048 Personal history of other malignant neoplasm of rectum, rectosigmoid junction, and anus: Secondary | ICD-10-CM

## 2012-01-01 DIAGNOSIS — K6289 Other specified diseases of anus and rectum: Secondary | ICD-10-CM

## 2012-01-01 LAB — URINE DRUGS OF ABUSE SCREEN W ALC, ROUTINE (REF LAB)
Amphetamine Screen, Ur: NEGATIVE
Barbiturate Quant, Ur: NEGATIVE
Benzodiazepines.: NEGATIVE
Cocaine Metabolites: POSITIVE — AB
Creatinine,U: 37.9 mg/dL
Marijuana Metabolite: NEGATIVE
Methadone: NEGATIVE
Opiate Screen, Urine: NEGATIVE
Phencyclidine (PCP): NEGATIVE
Propoxyphene: NEGATIVE

## 2012-01-01 NOTE — Telephone Encounter (Signed)
  Follow up Call-  Call back number 12/31/2011  Post procedure Call Back phone  # [phone] home or cell [phone]  Permission to leave phone message Yes     Patient questions:  Do you have a fever, pain , or abdominal swelling? no Pain Score  0 *  Have you tolerated food without any problems? yes  Have you been able to return to your normal activities? yes  Do you have any questions about your discharge instructions: Diet   no Medications  no Follow up visit  no  Do you have questions or concerns about your Care? no  Actions: * If pain score is 4 or above: No action needed, pain <4.

## 2012-01-01 NOTE — Telephone Encounter (Signed)
Called pt and informed her of Positve Tox Screen for Cocaine. Informed her Dr. Gaylyn Rong not able to prescribe her pain meds any longer as she broke the pain contract.  Explained she has option of having pain managed by pain clinic and Dr. Gaylyn Rong made referral or she can f/u w/ her PCP, RadOnc or GI surgeon.  Pt states she will likely f/u w/ PCP for pain management.  Pt verbalized understanding,  States hopes Dr. Gaylyn Rong not "disappointed w/ me."  Had Homero Fellers discussion w/ pt,  Explained no judgement, but we are concerned for her if she has addiction problem and wants help there is help available.  Pt states does not feel she has addiction issues and she will discuss this w/ Dr. Felicity Coyer.  Pt states will keep next appt as scheduled w/ Dr. Gaylyn Rong and understands he will not prescribe her pain meds any longer.

## 2012-01-01 NOTE — Telephone Encounter (Signed)
Message copied by Wende Mott on Thu Jan 01, 2012  3:27 PM ------      Message from: HA, Raliegh Ip T      Created: Thu Jan 01, 2012  8:23 AM       Please call and inform patient that her urine tox again showed cocaine.  This is the 2nd time.  She signed a pain contract promising not to use illegal substance.  Therefore, I will not be able to prescribe her pain med any more.  She has chronic abdominal pain from treatment of rectal cancer.  There is no active cancer at this time.  She can see her primary care physician, radiation oncologist or GI surgeon to ask if they would be willing to prescribe her pain med.  Another option is a referral to pain clinic which I will do today.

## 2012-01-05 ENCOUNTER — Telehealth: Payer: Self-pay | Admitting: *Deleted

## 2012-01-05 ENCOUNTER — Encounter: Payer: Self-pay | Admitting: Internal Medicine

## 2012-01-05 NOTE — Telephone Encounter (Signed)
Per patient voicemail I have canceled the appt for today. The patient stated "I'm not feeling well today and I will call back to reschedule."   JMW

## 2012-01-06 ENCOUNTER — Telehealth: Payer: Self-pay | Admitting: Oncology

## 2012-01-06 NOTE — Telephone Encounter (Signed)
S/w the pain clinc regarding an appt with dr Ethelene Hal and was informed that they do not participate in rectal pain. S/w the desk nurse natalie and she is aware.

## 2012-02-10 ENCOUNTER — Telehealth: Payer: Self-pay | Admitting: Oncology

## 2012-02-10 NOTE — Telephone Encounter (Signed)
Changed pt appt for pt...need 11.5.13 appt

## 2012-02-13 ENCOUNTER — Ambulatory Visit: Payer: Medicare Other | Admitting: Oncology

## 2012-02-13 ENCOUNTER — Other Ambulatory Visit: Payer: Medicare Other | Admitting: Lab

## 2012-02-16 ENCOUNTER — Ambulatory Visit: Payer: Medicare Other | Admitting: Internal Medicine

## 2012-02-16 DIAGNOSIS — Z0289 Encounter for other administrative examinations: Secondary | ICD-10-CM

## 2012-02-17 ENCOUNTER — Other Ambulatory Visit: Payer: Medicare Other | Admitting: Lab

## 2012-02-17 ENCOUNTER — Ambulatory Visit: Payer: Medicare Other | Admitting: Oncology

## 2012-02-29 ENCOUNTER — Other Ambulatory Visit: Payer: Self-pay | Admitting: Oncology

## 2012-03-01 ENCOUNTER — Telehealth: Payer: Self-pay | Admitting: Oncology

## 2012-03-01 NOTE — Telephone Encounter (Signed)
S/w the pt and she is aware of her dec appts that were r/s. Pt missed her last appts in nov.

## 2012-03-16 ENCOUNTER — Telehealth: Payer: Self-pay | Admitting: Oncology

## 2012-03-16 ENCOUNTER — Ambulatory Visit (HOSPITAL_BASED_OUTPATIENT_CLINIC_OR_DEPARTMENT_OTHER): Payer: Medicare Other | Admitting: Oncology

## 2012-03-16 ENCOUNTER — Other Ambulatory Visit (HOSPITAL_BASED_OUTPATIENT_CLINIC_OR_DEPARTMENT_OTHER): Payer: Medicare Other | Admitting: Lab

## 2012-03-16 ENCOUNTER — Encounter: Payer: Self-pay | Admitting: Oncology

## 2012-03-16 VITALS — BP 152/99 | HR 71 | Temp 97.9°F | Resp 20 | Ht 66.0 in | Wt 158.2 lb

## 2012-03-16 DIAGNOSIS — F17200 Nicotine dependence, unspecified, uncomplicated: Secondary | ICD-10-CM | POA: Diagnosis not present

## 2012-03-16 DIAGNOSIS — C19 Malignant neoplasm of rectosigmoid junction: Secondary | ICD-10-CM | POA: Diagnosis not present

## 2012-03-16 DIAGNOSIS — G620 Drug-induced polyneuropathy: Secondary | ICD-10-CM

## 2012-03-16 DIAGNOSIS — G589 Mononeuropathy, unspecified: Secondary | ICD-10-CM | POA: Diagnosis not present

## 2012-03-16 DIAGNOSIS — Z85048 Personal history of other malignant neoplasm of rectum, rectosigmoid junction, and anus: Secondary | ICD-10-CM

## 2012-03-16 DIAGNOSIS — G629 Polyneuropathy, unspecified: Secondary | ICD-10-CM

## 2012-03-16 DIAGNOSIS — K520 Gastroenteritis and colitis due to radiation: Secondary | ICD-10-CM

## 2012-03-16 LAB — CBC WITH DIFFERENTIAL/PLATELET
BASO%: 0.4 % (ref 0.0–2.0)
Basophils Absolute: 0 10*3/uL (ref 0.0–0.1)
EOS%: 1 % (ref 0.0–7.0)
Eosinophils Absolute: 0.1 10*3/uL (ref 0.0–0.5)
HCT: 44.1 % (ref 34.8–46.6)
HGB: 14.8 g/dL (ref 11.6–15.9)
LYMPH%: 17.3 % (ref 14.0–49.7)
MCH: 30.6 pg (ref 25.1–34.0)
MCHC: 33.5 g/dL (ref 31.5–36.0)
MCV: 91.2 fL (ref 79.5–101.0)
MONO#: 0.5 10*3/uL (ref 0.1–0.9)
MONO%: 6.4 % (ref 0.0–14.0)
NEUT#: 6.3 10*3/uL (ref 1.5–6.5)
NEUT%: 74.9 % (ref 38.4–76.8)
Platelets: 289 10*3/uL (ref 145–400)
RBC: 4.84 10*6/uL (ref 3.70–5.45)
RDW: 14.3 % (ref 11.2–14.5)
WBC: 8.4 10*3/uL (ref 3.9–10.3)
lymph#: 1.5 10*3/uL (ref 0.9–3.3)

## 2012-03-16 LAB — COMPREHENSIVE METABOLIC PANEL (CC13)
ALT: 45 U/L (ref 0–55)
AST: 33 U/L (ref 5–34)
Albumin: 4 g/dL (ref 3.5–5.0)
Alkaline Phosphatase: 112 U/L (ref 40–150)
BUN: 16 mg/dL (ref 7.0–26.0)
CO2: 20 mEq/L — ABNORMAL LOW (ref 22–29)
Calcium: 9.6 mg/dL (ref 8.4–10.4)
Chloride: 105 mEq/L (ref 98–107)
Creatinine: 0.8 mg/dL (ref 0.6–1.1)
Glucose: 109 mg/dl — ABNORMAL HIGH (ref 70–99)
Potassium: 3.5 mEq/L (ref 3.5–5.1)
Sodium: 139 mEq/L (ref 136–145)
Total Bilirubin: 2 mg/dL — ABNORMAL HIGH (ref 0.20–1.20)
Total Protein: 7.2 g/dL (ref 6.4–8.3)

## 2012-03-16 LAB — CEA: CEA: 1.2 ng/mL (ref 0.0–5.0)

## 2012-03-16 MED ORDER — SODIUM CHLORIDE 0.9 % IJ SOLN
10.0000 mL | INTRAMUSCULAR | Status: DC | PRN
  Administered 2012-03-16: 10 mL via INTRAVENOUS
  Filled 2012-03-16: qty 10

## 2012-03-16 MED ORDER — HEPARIN SOD (PORK) LOCK FLUSH 100 UNIT/ML IV SOLN
500.0000 [IU] | Freq: Once | INTRAVENOUS | Status: AC
  Administered 2012-03-16: 500 [IU] via INTRAVENOUS
  Filled 2012-03-16: qty 5

## 2012-03-16 NOTE — Telephone Encounter (Signed)
appts made and printed for pt aom °

## 2012-03-16 NOTE — Progress Notes (Signed)
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   PAST THERAPY: Neoadjuvant 5-FU/radiation therapy, status post low anterior resection on March 07, 2009. She started adjuvant FOLFOX on April 16, 2009. Oxaliplatin was discontinued after cycle #10 of FOLFOX given grade II/III neuropathy. She finished chemotherapy on 09/26/2009.  She underwent at [facility] in 05/2011 with transverse loop colostomy due to poor bowel function.    CURRENT THERAPY: Watchful observation.  INTERVAL HISTORY: [name] 56 y.o. female returns for regular follow up.  She denies recurrent hematochezia in her colostomy. She still has frequent stool output.  She changes her colostomy bag about 5 times daily with diarrheal stools.  She still has pelvic pain from history of cancer and treatment.  Off Percocet at this time, but plans to discuss pain with PCP at next visit. She also has lower extremity neuropathic pain requiring Neurontin.  She still smokes cigarettes and does not wish to quit at this time. She has chronic fatigue.  She is no longer able to work due to these symptoms.   Patient denies headache, visual changes, confusion, drenching night sweats, palpable lymph node swelling, mucositis, odynophagia, dysphagia, nausea vomiting, jaundice, chest pain, palpitation, shortness of breath, dyspnea on exertion, productive cough, gum bleeding, epistaxis, hematemesis, hemoptysis, abdominal pain, abdominal swelling, early satiety, melena, hematuria, skin rash, spontaneous bleeding, joint swelling, joint pain, heat or cold intolerance, bowel bladder incontinence, back pain.    Past Medical History  Diagnosis Date  . 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 Hypertension   . Diverticulosis   . Radiation proctitis     mild  . Adenomatous colon polyp     . Anemia, unspecified   . 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 Depression   . Nonspecific colitis 10/10/11  . Cocaine abuse     as recently as 10/21/11    Past Surgical History  Procedure Date  . Mouth surgery 10/2009  . Low anterior bowel resection 03/07/09  . Colostomy   . Portacath placement     Current Outpatient Prescriptions  Medication Sig Dispense Refill  . citalopram (CELEXA) 10 MG tablet Take 1 tablet (10 mg total) by mouth daily.  90 tablet  1  . gabapentin (NEURONTIN) 100 MG capsule Take 300 mg by mouth 2 (two) times daily.      . hydrochlorothiazide (HYDRODIURIL) 25 MG tablet Take 1 tablet (25 mg total) by mouth daily.  90 tablet  2   Current Facility-Administered Medications  Medication Dose Route Frequency Provider Last Rate Last Dose  . [COMPLETED] heparin lock flush 100 unit/mL  500 Units Intravenous Once Myrtis Ser, NP   500 Units at 03/16/12 0954  . sodium chloride 0.9 % injection 10 mL  10 mL Intravenous PRN Myrtis Ser, NP   10 mL at 03/16/12 0953    ALLERGIES:  is allergic to ampicillin; hydrocodone; and penicillins.  REVIEW OF SYSTEMS:  The rest of the 14-point review of system was negative.   Filed Vitals:   03/16/12 0932  BP: 152/99  Pulse: 71  Temp: 97.9 F (36.6 C)  Resp: 20   Wt Readings from Last 3 Encounters:  03/16/12 158 lb 3.2 oz (71.759 kg)  12/31/11 141 lb (63.957 kg)  11/12/11 147 lb 3.2  oz (66.769 kg)   ECOG Performance status: 1  PHYSICAL EXAMINATION:  General: well nourished woman, in no acute distress. Eyes: no scleral icterus. ENT: There were no oropharyngeal lesions. Neck was without thyromegaly. Lymphatics: Negative cervical, supraclavicular or axillary adenopathy. Respiratory: lungs were clear bilaterally without wheezing or crackles. Cardiovascular: Regular rate and rhythm, S1/S2, without murmur, rub or gallop. There was no pedal edema. GI: abdomen was soft, flat, nontender,  nondistended, without organomegaly. Muscoloskeletal: no spinal tenderness of palpation of vertebral spine. Skin exam was without echymosis, petichae. Neuro exam was nonfocal. Patient was able to get on and off exam table without assistance. Gait was normal. Patient was alerted and oriented. Attention was good. Language was appropriate. Mood was normal without depression. Speech was not pressured. Thought content was not tangential.   LABORATORY/RADIOLOGY DATA:  Lab Results  Component Value Date   WBC 8.4 03/16/2012   HGB 14.8 03/16/2012   HCT 44.1 03/16/2012   PLT 289 03/16/2012   GLUCOSE 109* 03/16/2012   CHOL 168 05/30/2010   TRIG 354.0* 05/30/2010   HDL 53.10 05/30/2010   LDLDIRECT 78.9 05/30/2010   ALKPHOS 112 03/16/2012   ALT 45 03/16/2012   AST 33 03/16/2012   NA 139 03/16/2012   K 3.5 03/16/2012   CL 105 03/16/2012   CREATININE 0.8 03/16/2012   BUN 16.0 03/16/2012   CO2 20* 03/16/2012   INR 0.97 10/17/2009    ASSESSMENT AND PLAN:  1. History of rectal cancer. She is currently on observation and surveillance. She currently has no evidence of any disease recurrence or metastatic disease today on history, clinical exam, and lab. She did have a routine surveillance colonoscopy back in September 2013. Her next one is due March 2015. Her last CT abd 09/2011 was negative Per NCCN guideline, surveillance CT is due yearly; next one is due in June 2014.  2. Radiation induced colitis. She remains on a low residue diet as well as Lomotil prn.  3. 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 Still residual neuropathy. On Neurontin.  4. Hematochezia:  No anemia.  Resolved.  5. Hypertension. She is on hydrochlorothiazide per PCP. BP is elevated today and I will defer to PCP for further management. 6. Smoking: She still smokes about 1/2 to 1 pack a day. She is at high risk of secondary cancer. Does not wish to quit. 7. Followup with the Cancer Center in about 6 months.         The length of time of the  face-to-face encounter was 15 minutes. More than 50% of time was spent counseling and coordination of care.

## 2012-03-19 ENCOUNTER — Encounter: Payer: Self-pay | Admitting: Internal Medicine

## 2012-03-19 ENCOUNTER — Ambulatory Visit (INDEPENDENT_AMBULATORY_CARE_PROVIDER_SITE_OTHER): Payer: Medicare Other | Admitting: Internal Medicine

## 2012-03-19 VITALS — BP 138/82 | HR 71 | Temp 98.9°F | Ht 66.0 in | Wt 157.1 lb

## 2012-03-19 DIAGNOSIS — I10 Essential (primary) hypertension: Secondary | ICD-10-CM

## 2012-03-19 DIAGNOSIS — G589 Mononeuropathy, unspecified: Secondary | ICD-10-CM | POA: Diagnosis not present

## 2012-03-19 DIAGNOSIS — G8929 Other chronic pain: Secondary | ICD-10-CM

## 2012-03-19 DIAGNOSIS — F32A Depression, unspecified: Secondary | ICD-10-CM

## 2012-03-19 DIAGNOSIS — F329 Major depressive disorder, single episode, unspecified: Secondary | ICD-10-CM | POA: Diagnosis not present

## 2012-03-19 MED ORDER — GABAPENTIN 300 MG PO CAPS: 300.0000 mg | ORAL_CAPSULE | Freq: Three times a day (TID) | ORAL | Status: DC

## 2012-03-19 MED ORDER — CITALOPRAM HYDROBROMIDE 20 MG PO TABS: 20.0000 mg | ORAL_TABLET | Freq: Every day | ORAL | Status: DC

## 2012-03-19 NOTE — Assessment & Plan Note (Signed)
Feet>hands - related to prior chemo  Started gabapentin 2012 for same - titrate up now Previously on Oxy prn neuro pain < rectal pain, but onc stopped rx'ing due to +UDS (coc) 10/2011 - Will make refer to pain clinic now at pt request to consider re-rx same

## 2012-03-19 NOTE — Patient Instructions (Signed)
It was good to see you today. We have reviewed your prior records including labs and tests today Increase dose citalopram and gabapentin now as discussed - see below we'll make referral to pain clinic. Our office will contact you regarding appointment(s) once made. Please schedule followup in 6 months, call sooner if problems.

## 2012-03-19 NOTE — Assessment & Plan Note (Signed)
Hospitalization for same with unintentional overdose 10/2011 Started citalopram 10/2011 and improved, titrate up now - [facility] group or individual therapy, but will call if needs change Support offered today

## 2012-03-19 NOTE — Assessment & Plan Note (Signed)
The current medical regimen is effective;  continue present plan and medications. BP Readings from Last 3 Encounters:  03/19/12 138/82  03/16/12 152/99  12/31/11 136/85

## 2012-03-19 NOTE — Progress Notes (Signed)
Subjective:    Patient ID: [name], female    DOB: 1956/02/02, 56 y.o.   MRN: [id number]  HPI  Here for follow up - reviewed chronic medical issues:  colon ca dx 10/2008 - s/p low ant resection 02/2009 and chemo thru 09/26/09 - follow up colo has been unremarkable - residual constip alt with severe diarrhea - takes laxatives then immodium respectively - occ rectal pain and soreness, relieved with oxy prn -?pain clinic referral - colo 12/2011 reviewed due to colitis on CT  neuropathy - chemo induced (oxaliplatin) - primarily affects hands and feet - intol of lyrica due to exac of GI symptoms - remains on percocet 2x/d for control of same - gabapentin started 04/2010 - improving  HTN - reports compliance with ongoing medical treatment and no changes in medication dose or frequency. denies adverse side effects related to current therapy. no edema, chest pain or headache   Past Medical History  Diagnosis Date  . 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 Hypertension   . Diverticulosis   . Radiation proctitis     mild  . Adenomatous colon polyp   . Anemia, unspecified   . 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 Depression   . Nonspecific colitis 10/10/11  . Cocaine abuse     as recently as 10/21/11    Review of Systems  Constitutional: Negative for fever and fatigue.  HENT: Negative for facial swelling.   Respiratory: Negative for shortness of breath.   Cardiovascular: Negative for chest pain and palpitations.  Gastrointestinal: Positive for diarrhea (chronic) and rectal pain. Negative for vomiting.  Psychiatric/Behavioral: Negative for suicidal ideas, behavioral problems, confusion, sleep disturbance, dysphoric mood and decreased concentration. The patient is not nervous/anxious.        Objective:   Physical Exam  BP 138/82  Pulse 71  Temp 98.9 F (37.2 C) (Oral)  Ht 5\' 6"  (1.676 m)  Wt 157 lb 1.9 oz (71.269 kg)  BMI 25.36 kg/m2  SpO2  95% Wt Readings from Last 3 Encounters:  03/19/12 157 lb 1.9 oz (71.269 kg)  03/16/12 158 lb 3.2 oz (71.759 kg)  12/31/11 141 lb (63.957 kg)   Constitutional: She appears well-developed and well-nourished. No distress.  Neck: Normal range of motion. Neck supple. No JVD present. No thyromegaly present.  Cardiovascular: Normal rate, regular rhythm and normal heart sounds.  No murmur heard. No BLE edema. Pulmonary/Chest: Effort normal and breath sounds normal. No respiratory distress. She has no wheezes.  Abdominal:  Colostomy bag intact across transverse colostomy site (mid anterior upper abdomen) - Soft. Bowel sounds are normal. She exhibits no distension. There is no tenderness. no masses Skin: Skin is warm and dry. No rash noted. No erythema.  Psychiatric: She has a normal mood and affect. Her behavior is normal. Judgment and thought content normal.   Lab Results  Component Value Date   WBC 8.4 03/16/2012   HGB 14.8 03/16/2012   HCT 44.1 03/16/2012   PLT 289 03/16/2012   GLUCOSE 109* 03/16/2012   CHOL 168 05/30/2010   TRIG 354.0* 05/30/2010   HDL 53.10 05/30/2010   LDLDIRECT 78.9 05/30/2010   ALT 45 03/16/2012   AST 33 03/16/2012   NA 139 03/16/2012   K 3.5 03/16/2012   CL 105 03/16/2012   CREATININE 0.8 03/16/2012   BUN 16.0 03/16/2012   CO2 20* 03/16/2012   TSH 0.53 05/30/2010   INR 0.97  10/17/2009        Assessment & Plan:  See problem list. Medications and labs reviewed today.

## 2012-04-15 ENCOUNTER — Telehealth: Payer: Self-pay | Admitting: Internal Medicine

## 2012-04-15 NOTE — Telephone Encounter (Signed)
Patient calling to report her stoma has drainage and it has an odor. States she is having trouble keeping the appliance on because of the drainage. The stoma is red also. States she has had this for the last week. Scheduled patient with Willette Cluster, NP tomorrow at 2:00 PM.

## 2012-04-15 NOTE — Telephone Encounter (Signed)
I agree with her seeing Gunnar Fusi, may need cultures and Flagyl.

## 2012-04-16 ENCOUNTER — Encounter: Payer: Self-pay | Admitting: Nurse Practitioner

## 2012-04-16 ENCOUNTER — Ambulatory Visit (INDEPENDENT_AMBULATORY_CARE_PROVIDER_SITE_OTHER): Payer: Medicare Other | Admitting: Nurse Practitioner

## 2012-04-16 VITALS — BP 140/80 | HR 72 | Ht 66.0 in | Wt 168.0 lb

## 2012-04-16 DIAGNOSIS — K469 Unspecified abdominal hernia without obstruction or gangrene: Secondary | ICD-10-CM | POA: Diagnosis not present

## 2012-04-16 NOTE — Progress Notes (Addendum)
04/16/2012 [name] [id number] 1955-07-18   History of Present Illness:  Patient is a 57 year old female with a history of T2 N1C MO rectal cancer, s/p chemoradiation and anterior resection in 2010. 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 Anti-diarrheals didn't help. Her insurance wouldn't pay for therapy for pelvic floor dysfunction. Patient ultimately referred to [facility] where she underwent diverting colostomy (Dr. Luciano Cutter). In June of this year patient had a CTscan for hematochezia. Findings included marked thickening of segment of transverse colon leading to colostomy. For evaluation patient was sceduled for a colonoscopy. Findings included normal double barrel colostomy / normal bowel.   Patient comes in today with complains of bleeding from stoma She has been passing blood in ostomy bag since summertime. Stool output hasn't change. No abdominal pain. She wants referral to surgeon for huge peristomal hernia.  Current Medications, Allergies, Past Medical History, Past Surgical History, Family History and Social History were reviewed in [employer] record.   Physical Exam: General: Well developed , black female in no acute distress Head: Normocephalic and atraumatic Eyes:  sclerae anicteric, conjunctiva pink  Ears: Normal auditory acuity Lungs: Clear throughout to auscultation Heart: Regular rate and rhythm Abdomen: Soft, non tender and non distended. Large peristomal hernia. Approximately 100cc bloody drainage in ostomy bag. No masses, no hepatomegaly. Normal bowel sounds Musculoskeletal: Symmetrical with no gross deformities  Extremities: No edema  Neurological: Alert oriented x 4, grossly nonfocal Psychological:  Alert and cooperative. Normal mood and affect  Assessment and Recommendations:  50. 57 year old female with large peristomal hernia. Patient requesting referral back to her surgeon (Dr. Luciano Cutter) at [facility]. This is reasonable  as parastomal hernia is quite large and new since last visit to [facility]. We will arrange for referral.   2. Chronic FI bleeding. Small amount of blood tinged fluid in ostomy bag some of which seems to be coming from peristomal irritation. No abdominal pain. Will await surgical evaluation.

## 2012-04-19 ENCOUNTER — Telehealth: Payer: Self-pay | Admitting: *Deleted

## 2012-04-19 DIAGNOSIS — K469 Unspecified abdominal hernia without obstruction or gangrene: Secondary | ICD-10-CM | POA: Insufficient documentation

## 2012-04-19 NOTE — Progress Notes (Signed)
Reviewed. Is Dr Luciano Cutter at [facility] now? Her surgery was at [facility]. I agree she needs to see a Careers adviser but it could be here in [place] as well.

## 2012-04-19 NOTE — Telephone Encounter (Signed)
Patient given appointment date and time. States she knows where the clinic is located.

## 2012-04-19 NOTE — Telephone Encounter (Signed)
Per Willette Cluster, NP patient needs to be referred back to Dr. Luciano Cutter her surgeon at [facility]. [phone]). Patient scheduled with Dr. Jeralene Peters) on 04/22/12 at 2:00 PM @ cancer center 3rd floor clinic 3-2. Left a message for patient to call me.

## 2012-04-22 DIAGNOSIS — C20 Malignant neoplasm of rectum: Secondary | ICD-10-CM | POA: Diagnosis not present

## 2012-04-29 ENCOUNTER — Ambulatory Visit: Payer: Medicare Other | Admitting: Internal Medicine

## 2012-05-11 ENCOUNTER — Emergency Department (HOSPITAL_COMMUNITY)
Admission: EM | Admit: 2012-05-11 | Discharge: 2012-05-11 | Disposition: A | Discharge status: Home / Self Care | Payer: Medicare Other | Attending: Emergency Medicine | Admitting: Emergency Medicine

## 2012-05-11 DIAGNOSIS — K469 Unspecified abdominal hernia without obstruction or gangrene: Secondary | ICD-10-CM

## 2012-05-11 DIAGNOSIS — Z79899 Other long term (current) drug therapy: Secondary | ICD-10-CM | POA: Insufficient documentation

## 2012-05-11 DIAGNOSIS — F1410 Cocaine abuse, uncomplicated: Secondary | ICD-10-CM | POA: Insufficient documentation

## 2012-05-11 DIAGNOSIS — F329 Major depressive disorder, single episode, unspecified: Secondary | ICD-10-CM | POA: Insufficient documentation

## 2012-05-11 DIAGNOSIS — Z860100 Personal history of colon polyps, unspecified: Secondary | ICD-10-CM | POA: Insufficient documentation

## 2012-05-11 DIAGNOSIS — K458 Other specified abdominal hernia without obstruction or gangrene: Secondary | ICD-10-CM | POA: Diagnosis not present

## 2012-05-11 DIAGNOSIS — Z85038 Personal history of other malignant neoplasm of large intestine: Secondary | ICD-10-CM | POA: Insufficient documentation

## 2012-05-11 DIAGNOSIS — R109 Unspecified abdominal pain: Secondary | ICD-10-CM | POA: Diagnosis not present

## 2012-05-11 DIAGNOSIS — F3289 Other specified depressive episodes: Secondary | ICD-10-CM | POA: Insufficient documentation

## 2012-05-11 DIAGNOSIS — F17200 Nicotine dependence, unspecified, uncomplicated: Secondary | ICD-10-CM | POA: Insufficient documentation

## 2012-05-11 DIAGNOSIS — Z862 Personal history of diseases of the blood and blood-forming organs and certain disorders involving the immune mechanism: Secondary | ICD-10-CM | POA: Insufficient documentation

## 2012-05-11 DIAGNOSIS — Y849 Medical procedure, unspecified as the cause of abnormal reaction of the patient, or of later complication, without mention of misadventure at the time of the procedure: Secondary | ICD-10-CM | POA: Insufficient documentation

## 2012-05-11 DIAGNOSIS — R6889 Other general symptoms and signs: Secondary | ICD-10-CM | POA: Diagnosis not present

## 2012-05-11 DIAGNOSIS — Z933 Colostomy status: Secondary | ICD-10-CM | POA: Insufficient documentation

## 2012-05-11 DIAGNOSIS — Z8669 Personal history of other diseases of the nervous system and sense organs: Secondary | ICD-10-CM | POA: Insufficient documentation

## 2012-05-11 DIAGNOSIS — D649 Anemia, unspecified: Secondary | ICD-10-CM | POA: Insufficient documentation

## 2012-05-11 DIAGNOSIS — Z8719 Personal history of other diseases of the digestive system: Secondary | ICD-10-CM | POA: Insufficient documentation

## 2012-05-11 DIAGNOSIS — I10 Essential (primary) hypertension: Secondary | ICD-10-CM | POA: Insufficient documentation

## 2012-05-11 DIAGNOSIS — IMO0002 Reserved for concepts with insufficient information to code with codable children: Secondary | ICD-10-CM | POA: Insufficient documentation

## 2012-05-11 LAB — CBC
HCT: 37.8 % (ref 36.0–46.0)
Hemoglobin: 12.7 g/dL (ref 12.0–15.0)
MCH: 31.5 pg (ref 26.0–34.0)
MCHC: 33.6 g/dL (ref 30.0–36.0)
MCV: 93.8 fL (ref 78.0–100.0)
Platelets: 238 10*3/uL (ref 150–400)
RBC: 4.03 MIL/uL (ref 3.87–5.11)
RDW: 14.9 % (ref 11.5–15.5)
WBC: 5.4 10*3/uL (ref 4.0–10.5)

## 2012-05-11 MED ORDER — ACETAMINOPHEN 325 MG PO TABS
650.0000 mg | ORAL_TABLET | Freq: Once | ORAL | Status: AC
  Administered 2012-05-11: 650 mg via ORAL

## 2012-05-11 MED ORDER — ACETAMINOPHEN 325 MG PO TABS
ORAL_TABLET | ORAL | Status: AC
  Administered 2012-05-11: 650 mg via ORAL
  Filled 2012-05-11: qty 2
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
## 2012-07-06 ENCOUNTER — Ambulatory Visit (HOSPITAL_BASED_OUTPATIENT_CLINIC_OR_DEPARTMENT_OTHER): Payer: Medicare Other

## 2012-07-06 VITALS — BP 146/81 | HR 63 | Temp 98.3°F | Resp 16

## 2012-07-06 DIAGNOSIS — Z452 Encounter for adjustment and management of vascular access device: Secondary | ICD-10-CM | POA: Diagnosis not present

## 2012-07-06 DIAGNOSIS — Z85048 Personal history of other malignant neoplasm of rectum, rectosigmoid junction, and anus: Secondary | ICD-10-CM | POA: Diagnosis not present

## 2012-07-06 MED ORDER — HEPARIN SOD (PORK) LOCK FLUSH 100 UNIT/ML IV SOLN
500.0000 [IU] | Freq: Once | INTRAVENOUS | Status: AC
  Administered 2012-07-06: 500 [IU] via INTRAVENOUS
  Filled 2012-07-06: qty 5

## 2012-07-06 MED ORDER — SODIUM CHLORIDE 0.9 % IJ SOLN
10.0000 mL | INTRAMUSCULAR | Status: DC | PRN
  Administered 2012-07-06: 10 mL via INTRAVENOUS
  Filled 2012-07-06: qty 10

## 2012-07-20 ENCOUNTER — Other Ambulatory Visit: Payer: Self-pay | Admitting: Oncology

## 2012-08-03 ENCOUNTER — Telehealth: Payer: Self-pay | Admitting: *Deleted

## 2012-08-03 NOTE — Telephone Encounter (Signed)
Pt called for refill on her HCTZ from Dr. Gaylyn Rong.  Suggested she have her PCP, Dr. Felicity Coyer, take over this anti hypertension medication.   Instructed her if unable to get Rx right away from Dr. Felicity Coyer, then Dr. Gaylyn Rong may give a one time refill.  Pt verbalized understanding and will call Dr. Diamantina Monks office first about refill and call us back if need one time refill from Dr. Gaylyn Rong.

## 2012-08-05 ENCOUNTER — Ambulatory Visit (INDEPENDENT_AMBULATORY_CARE_PROVIDER_SITE_OTHER): Payer: Medicare Other | Admitting: Internal Medicine

## 2012-08-05 ENCOUNTER — Encounter: Payer: Self-pay | Admitting: Internal Medicine

## 2012-08-05 VITALS — BP 140/92 | HR 75 | Temp 98.2°F | Wt 176.8 lb

## 2012-08-05 DIAGNOSIS — I10 Essential (primary) hypertension: Secondary | ICD-10-CM | POA: Diagnosis not present

## 2012-08-05 DIAGNOSIS — F329 Major depressive disorder, single episode, unspecified: Secondary | ICD-10-CM | POA: Diagnosis not present

## 2012-08-05 DIAGNOSIS — F32A Depression, unspecified: Secondary | ICD-10-CM

## 2012-08-05 DIAGNOSIS — G589 Mononeuropathy, unspecified: Secondary | ICD-10-CM | POA: Diagnosis not present

## 2012-08-05 DIAGNOSIS — F3289 Other specified depressive episodes: Secondary | ICD-10-CM

## 2012-08-05 MED ORDER — BUPROPION HCL ER (XL) 150 MG PO TB24: 150.0000 mg | ORAL_TABLET | Freq: Every day | ORAL | Status: DC

## 2012-08-05 MED ORDER — TRIAMCINOLONE ACETONIDE 0.1 % MT PSTE: PASTE | Freq: Two times a day (BID) | OROMUCOSAL | Status: DC

## 2012-08-05 MED ORDER — HYDROCHLOROTHIAZIDE 25 MG PO TABS: 25.0000 mg | ORAL_TABLET | Freq: Every day | ORAL | Status: DC

## 2012-08-05 MED ORDER — ALPRAZOLAM 0.5 MG PO TABS: 0.5000 mg | ORAL_TABLET | Freq: Every evening | ORAL | Status: DC | PRN

## 2012-08-05 NOTE — Assessment & Plan Note (Signed)
The current medical regimen is effective;  continue present plan and medications. BP Readings from Last 3 Encounters:  08/05/12 140/92  07/06/12 146/81  05/11/12 115/55

## 2012-08-05 NOTE — Assessment & Plan Note (Signed)
Feet>hands - related to prior chemo  Started gabapentin 2012 for same - titrate up 03/2012 Previously on Oxy prn neuro pain < rectal pain, but onc stopped rx'ing due to +UDS (coc) 10/2011 - Refer to podiatry now for help with foot care -
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
## 2012-08-05 NOTE — Patient Instructions (Signed)
It was good to see you today. We have reviewed your prior records including labs and tests today add Wellbutrin once daily to ongoing Celexa. Use Xanax as needed for anxiety and nervous spells in addition to daily prescribed medications Use triamcinolone paste to mouth sore as needed Your prescription(s) have been submitted to your pharmacy. Please take as directed and contact our office if you believe you are having problem(s) with the medication(s). Other medications reviewed and updated, refills provided as requested. No additional changes we'll make referral to Podiatry to assist you with foot care. Our office will contact you regarding appointment(s) once made. Please schedule followup in 6-8 weeks to review symptoms and medications, call sooner if problems.

## 2012-08-17 DIAGNOSIS — M79609 Pain in unspecified limb: Secondary | ICD-10-CM | POA: Diagnosis not present

## 2012-08-17 DIAGNOSIS — M2040 Other hammer toe(s) (acquired), unspecified foot: Secondary | ICD-10-CM | POA: Diagnosis not present

## 2012-08-17 DIAGNOSIS — G609 Hereditary and idiopathic neuropathy, unspecified: Secondary | ICD-10-CM | POA: Diagnosis not present

## 2012-08-17 DIAGNOSIS — IMO0002 Reserved for concepts with insufficient information to code with codable children: Secondary | ICD-10-CM | POA: Diagnosis not present

## 2012-08-30 ENCOUNTER — Telehealth: Payer: Self-pay | Admitting: Oncology

## 2012-08-31 ENCOUNTER — Ambulatory Visit (HOSPITAL_BASED_OUTPATIENT_CLINIC_OR_DEPARTMENT_OTHER): Payer: Medicare Other

## 2012-08-31 VITALS — BP 141/83 | HR 65 | Temp 98.0°F

## 2012-08-31 DIAGNOSIS — C19 Malignant neoplasm of rectosigmoid junction: Secondary | ICD-10-CM | POA: Diagnosis not present

## 2012-08-31 DIAGNOSIS — Z452 Encounter for adjustment and management of vascular access device: Secondary | ICD-10-CM

## 2012-08-31 DIAGNOSIS — C20 Malignant neoplasm of rectum: Secondary | ICD-10-CM

## 2012-08-31 MED ORDER — HEPARIN SOD (PORK) LOCK FLUSH 100 UNIT/ML IV SOLN
500.0000 [IU] | Freq: Once | INTRAVENOUS | Status: AC
  Administered 2012-08-31: 500 [IU] via INTRAVENOUS
  Filled 2012-08-31: qty 5

## 2012-08-31 MED ORDER — SODIUM CHLORIDE 0.9 % IJ SOLN
10.0000 mL | INTRAMUSCULAR | Status: DC | PRN
  Administered 2012-08-31: 10 mL via INTRAVENOUS
  Filled 2012-08-31: qty 10

## 2012-09-13 ENCOUNTER — Telehealth: Payer: Self-pay | Admitting: Internal Medicine

## 2012-09-13 MED ORDER — CITALOPRAM HYDROBROMIDE 20 MG PO TABS: 20.0000 mg | ORAL_TABLET | Freq: Every day | ORAL | Status: DC

## 2012-09-13 NOTE — Telephone Encounter (Signed)
Request refill of celexa, please call into [facility]. Pt request to be made aware once sent

## 2012-09-13 NOTE — Telephone Encounter (Signed)
Called pt was not home left msg with sister rx sent to [facility]...lmb

## 2012-09-14 ENCOUNTER — Ambulatory Visit (HOSPITAL_COMMUNITY)
Admission: RE | Admit: 2012-09-14 | Discharge: 2012-09-14 | Disposition: A | Discharge status: Home / Self Care | Payer: Medicare Other | Source: Ambulatory Visit | Attending: Oncology | Admitting: Oncology

## 2012-09-14 ENCOUNTER — Encounter (HOSPITAL_COMMUNITY): Payer: Self-pay

## 2012-09-14 ENCOUNTER — Other Ambulatory Visit (HOSPITAL_BASED_OUTPATIENT_CLINIC_OR_DEPARTMENT_OTHER): Payer: Medicare Other | Admitting: Lab

## 2012-09-14 DIAGNOSIS — Z85048 Personal history of other malignant neoplasm of rectum, rectosigmoid junction, and anus: Secondary | ICD-10-CM

## 2012-09-14 DIAGNOSIS — C19 Malignant neoplasm of rectosigmoid junction: Secondary | ICD-10-CM | POA: Diagnosis not present

## 2012-09-14 DIAGNOSIS — C20 Malignant neoplasm of rectum: Secondary | ICD-10-CM | POA: Insufficient documentation

## 2012-09-14 LAB — CBC WITH DIFFERENTIAL/PLATELET
BASO%: 0.3 % (ref 0.0–2.0)
Basophils Absolute: 0 10*3/uL (ref 0.0–0.1)
EOS%: 1.2 % (ref 0.0–7.0)
Eosinophils Absolute: 0.1 10*3/uL (ref 0.0–0.5)
HCT: 39.5 % (ref 34.8–46.6)
HGB: 13.1 g/dL (ref 11.6–15.9)
LYMPH%: 24.8 % (ref 14.0–49.7)
MCH: 30.4 pg (ref 25.1–34.0)
MCHC: 33.2 g/dL (ref 31.5–36.0)
MCV: 91.6 fL (ref 79.5–101.0)
MONO#: 0.5 10*3/uL (ref 0.1–0.9)
MONO%: 8 % (ref 0.0–14.0)
NEUT#: 4.3 10*3/uL (ref 1.5–6.5)
NEUT%: 65.7 % (ref 38.4–76.8)
Platelets: 288 10*3/uL (ref 145–400)
RBC: 4.31 10*6/uL (ref 3.70–5.45)
RDW: 14.3 % (ref 11.2–14.5)
WBC: 6.5 10*3/uL (ref 3.9–10.3)
lymph#: 1.6 10*3/uL (ref 0.9–3.3)

## 2012-09-14 LAB — COMPREHENSIVE METABOLIC PANEL (CC13)
ALT: 27 U/L (ref 0–55)
AST: 22 U/L (ref 5–34)
Albumin: 3.7 g/dL (ref 3.5–5.0)
Alkaline Phosphatase: 98 U/L (ref 40–150)
BUN: 11.5 mg/dL (ref 7.0–26.0)
CO2: 23 mEq/L (ref 22–29)
Calcium: 9.3 mg/dL (ref 8.4–10.4)
Chloride: 108 mEq/L — ABNORMAL HIGH (ref 98–107)
Creatinine: 0.8 mg/dL (ref 0.6–1.1)
Glucose: 106 mg/dl — ABNORMAL HIGH (ref 70–99)
Potassium: 3.3 mEq/L — ABNORMAL LOW (ref 3.5–5.1)
Sodium: 143 mEq/L (ref 136–145)
Total Bilirubin: 0.66 mg/dL (ref 0.20–1.20)
Total Protein: 7.2 g/dL (ref 6.4–8.3)

## 2012-09-14 MED ORDER — IOHEXOL 300 MG/ML  SOLN
100.0000 mL | Freq: Once | INTRAMUSCULAR | Status: AC | PRN
  Administered 2012-09-14: 100 mL via INTRAVENOUS

## 2012-09-16 ENCOUNTER — Ambulatory Visit (HOSPITAL_BASED_OUTPATIENT_CLINIC_OR_DEPARTMENT_OTHER): Payer: Medicare Other | Admitting: Oncology

## 2012-09-16 ENCOUNTER — Telehealth: Payer: Self-pay | Admitting: Oncology

## 2012-09-16 VITALS — BP 126/79 | HR 79 | Temp 98.3°F | Resp 18 | Ht 66.0 in | Wt 172.1 lb

## 2012-09-16 DIAGNOSIS — K520 Gastroenteritis and colitis due to radiation: Secondary | ICD-10-CM

## 2012-09-16 DIAGNOSIS — F17200 Nicotine dependence, unspecified, uncomplicated: Secondary | ICD-10-CM

## 2012-09-16 DIAGNOSIS — C19 Malignant neoplasm of rectosigmoid junction: Secondary | ICD-10-CM | POA: Diagnosis not present

## 2012-09-16 DIAGNOSIS — K921 Melena: Secondary | ICD-10-CM | POA: Diagnosis not present

## 2012-09-16 DIAGNOSIS — Z85048 Personal history of other malignant neoplasm of rectum, rectosigmoid junction, and anus: Secondary | ICD-10-CM

## 2012-09-16 NOTE — Progress Notes (Signed)
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   PAST THERAPY: Neoadjuvant 5-FU/radiation therapy, status post low anterior resection on March 07, 2009. She started adjuvant FOLFOX on April 16, 2009. Oxaliplatin was discontinued after cycle #10 of FOLFOX given grade II/III neuropathy. She finished chemotherapy on 09/26/2009.  She underwent at [facility] in 05/2011 with transverse loop colostomy due to poor bowel function.    CURRENT THERAPY: Watchful observation.  INTERVAL HISTORY: [name] 57 y.o. female returns for regular follow up by herself.  She recently moved from her sister's [place]  She has been much less active.  She watches lots of TV and snacks and does not exercise.  She is doing her best to cut down her smoking.  She is on Wellbutrin per PCP.  She still has high stool output in the colostomy.  She has hematochezia in the colostomy once every few weeks.  Her last surveillance colonoscopy in 06/2010 was reportedly negative.  Her next one is due around 2015 unless she has recurrent, frequent hematochezia.  She still has neuropathy in the feet worst than the hands.  She thinks that the symptom is manageable with Neurontin.  She denied pelvic pain, back pain, cough, SOB, skin rash.  The rest of the 14-point review of system was negative.   Past Medical History  Diagnosis Date  . 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 Hypertension   . Diverticulosis   . Radiation proctitis     mild  . Adenomatous colon polyp   . Anemia, unspecified   . 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 Depression   . Nonspecific colitis 10/10/11  . Cocaine abuse     as recently as 10/21/11    Past Surgical History  Procedure Laterality Date  . Mouth surgery   10/2009  . Low anterior bowel resection  03/07/09  . Colostomy  2013    [facility]  . Portacath placement      Current Outpatient Prescriptions  Medication Sig Dispense Refill  . ALPRAZolam (XANAX) 0.5 MG tablet Take 1 tablet (0.5 mg total) by mouth at bedtime as needed for sleep or anxiety.  30 tablet  1  . buPROPion (WELLBUTRIN XL) 150 MG 24 hr tablet Take 1 tablet (150 mg total) by mouth daily.  30 tablet  5  . citalopram (CELEXA) 20 MG tablet Take 1 tablet (20 mg total) by mouth daily.  90 tablet  1  . gabapentin (NEURONTIN) 300 MG capsule Take 1-2 capsules (300-600 mg total) by mouth 3 (three) times daily.  180 capsule  3  . hydrochlorothiazide (HYDRODIURIL) 25 MG tablet Take 1 tablet (25 mg total) by mouth daily.  90 tablet  2   No current facility-administered medications for this visit.    ALLERGIES:  is allergic to ampicillin; hydrocodone; and penicillins.  REVIEW OF SYSTEMS:  The rest of the 14-point review of system was negative.   Filed Vitals:   09/16/12 1527  BP: 126/79  Pulse: 79  Temp: 98.3 F (36.8 C)  Resp: 18   Wt Readings from Last 3 Encounters:  09/16/12 172 lb 1.6 oz (78.064 kg)  08/05/12 176 lb 12.8 oz (80.196 kg)  04/16/12 168 lb (76.204 kg)  ECOG Performance status: 1  PHYSICAL EXAMINATION:  General: well nourished woman, in no acute distress. Eyes: no scleral icterus. ENT: There were no oropharyngeal lesions. Neck was without thyromegaly. Lymphatics: Negative cervical, supraclavicular or axillary adenopathy. Respiratory: lungs were clear bilaterally without wheezing or crackles. Cardiovascular: Regular rate and rhythm, S1/S2, without murmur, rub or gallop. There was no pedal edema. GI: abdomen was soft, flat, nontender, nondistended, without organomegaly. Colostomy was in place with normal colonic tissue and no obvious bleeding.  Muscoloskeletal: no spinal tenderness of palpation of vertebral spine. Skin exam was without echymosis, petichae. Neuro exam was  nonfocal. Patient was able to get on and off exam table without assistance. Gait was normal. Patient was alert and oriented. Attention was good. Language was appropriate. Mood was normal without depression. Speech was not pressured. Thought content was not tangential.   LABORATORY/RADIOLOGY DATA:  Lab Results  Component Value Date   WBC 6.5 09/14/2012   HGB 13.1 09/14/2012   HCT 39.5 09/14/2012   PLT 288 09/14/2012   GLUCOSE 106* 09/14/2012   CHOL 168 05/30/2010   TRIG 354.0* 05/30/2010   HDL 53.10 05/30/2010   LDLDIRECT 78.9 05/30/2010   ALKPHOS 98 09/14/2012   ALT 27 09/14/2012   AST 22 09/14/2012   NA 143 09/14/2012   K 3.3* 09/14/2012   CL 108* 09/14/2012   CREATININE 0.8 09/14/2012   BUN 11.5 09/14/2012   CO2 23 09/14/2012   INR 0.97 10/17/2009    Ct Abdomen Pelvis W Contrast 09/14/12:  I personally reviewed this CT scan.   Findings: There is been a previous rectal, abdominal peroneal,  resection. There is soft tissue attenuation in the prerectal space  with a small chronic fluid collection, measuring 26 mm by 12 mm in  size, without significant change. This is all presumed to be  postsurgical change. Anastomose staple line is seen in the region  of the rectosigmoid junction. The colon above this is  decompressed. There are diverticula along the colon without  diverticulitis. The transverse colon diverts into a mid abdomen  loop colostomy. No bowel masses are seen. There is no bowel  inflammation or obstruction.  Clear lung bases. The heart is normal in size.  There are no liver lesions. The hypoattenuating area noted in the  inferior medial segment of the left lobe of the liver on the prior  study is no longer evident.  Normal spleen, gallbladder and pancreas. No bile duct dilation.  Normal adrenal glands, kidneys and bladder.  No pathologically enlarged lymph nodes. No abnormal fluid  collections.  No osteoblastic or osteolytic lesions.  IMPRESSION:  No evidence of metastatic disease from rectal  carcinoma. Soft  tissue in the presacral space consistent with stable postoperative  change with a small associated fluid collection likely a chronic  seroma. No evidence of locally recurrent rectal carcinoma.  The area of hypoattenuation noted along the inferior liver adjacent  to the falciform ligament is no longer evident.    ASSESSMENT AND PLAN:  1. History of rectal cancer. She continues to be in remission.  She did have a routine surveillance colonoscopy back in March 2012. Her next one is due March 2015. Her last CT abd 09/2012 was negative.   Per NCCN guideline, surveillance CT is due yearly; next one is due 09/2013.  2. Radiation induced colitis. She is on Lomotil and Imodium prn.   3. 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  I advised her to consider acupuncture.  4. Hematochezia:  Chronic.  Most likely due to radiation colitis.  Past colonoscopy was negative.  I advised her to follow up with GI and [facility] colon surgeon if she has worsening hematochezia.  5. Hypertension. She is on hydrochlorothiazide.  6. Smoking:  She is on Wellburin.   7. Followup with the Cancer Center in about 6 months.     I informed Ms. Cheree Ditto that I am leaving the practice.  The [facility] will arrange for her to see another provider when she returns.      The length of time of the face-to-face encounter was 15 minutes. More than 50% of time was spent counseling and coordination of care.

## 2012-09-16 NOTE — Telephone Encounter (Signed)
gv and printd appt sched and avs for pt. °

## 2012-10-11 ENCOUNTER — Ambulatory Visit (INDEPENDENT_AMBULATORY_CARE_PROVIDER_SITE_OTHER): Payer: Medicare Other | Admitting: Internal Medicine

## 2012-10-11 ENCOUNTER — Encounter: Payer: Self-pay | Admitting: Internal Medicine

## 2012-10-11 VITALS — BP 140/80 | HR 64 | Temp 98.6°F | Wt 171.8 lb

## 2012-10-11 DIAGNOSIS — F32A Depression, unspecified: Secondary | ICD-10-CM

## 2012-10-11 DIAGNOSIS — Z85048 Personal history of other malignant neoplasm of rectum, rectosigmoid junction, and anus: Secondary | ICD-10-CM | POA: Diagnosis not present

## 2012-10-11 DIAGNOSIS — F329 Major depressive disorder, single episode, unspecified: Secondary | ICD-10-CM

## 2012-10-11 MED ORDER — AMITRIPTYLINE HCL 25 MG PO TABS: 12.5000 mg | ORAL_TABLET | Freq: Every day | ORAL | Status: DC

## 2012-10-11 MED ORDER — BUPROPION HCL 75 MG PO TABS: 75.0000 mg | ORAL_TABLET | Freq: Two times a day (BID) | ORAL | Status: DC

## 2012-10-11 NOTE — Patient Instructions (Signed)
It was good to see you today. We have reviewed your prior records including labs and tests today Change to short acting Wellbutrin twice daily and use amitriptyline at bedtime for sleep - Your prescription(s) have been submitted to your pharmacy. Please take as directed and contact our office if you believe you are having problem(s) with the medication(s). Continue ongoing Celexa and use Xanax only as needed for anxiety and nervous spells Other medications reviewed and updated, refills provided as requested. No additional changes  followup with colorectal surgery as discussed to consider colostomy revision Please schedule followup in 6 months to review symptoms and medications, call sooner if problems.

## 2012-10-11 NOTE — Assessment & Plan Note (Signed)
History of rectal cancer June 2010 dx. She continues to be in remission. She did have a routine surveillance colonoscopy back in March 2012. Her next one is due March 2015. Her last CT abd 09/2012 was negative. Per NCCN guideline, surveillance CT is due yearly; next one is due 09/2013.  Complicated by radiation induced colitis/chronic diarrhea. She is on Lomotil and Imodium prn.  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 I advised her to consider acupuncture.  Leaking colostomy - I advised her to follow up with GI and [facility] colon surgeon if she has worsening hematochezia
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
## 2012-10-28 ENCOUNTER — Other Ambulatory Visit: Payer: Self-pay | Admitting: Internal Medicine

## 2012-10-29 NOTE — Telephone Encounter (Signed)
Faxed script bck to [facility]...lmb 

## 2012-11-01 ENCOUNTER — Telehealth: Payer: Self-pay

## 2012-11-01 ENCOUNTER — Other Ambulatory Visit: Payer: Self-pay

## 2012-11-01 NOTE — Telephone Encounter (Signed)
lvm that pt missed her 8 am flush appt and to please call to reschedule.

## 2012-11-03 ENCOUNTER — Telehealth: Payer: Self-pay | Admitting: Hematology and Oncology

## 2012-11-03 NOTE — Telephone Encounter (Signed)
S/W THE PT'S FAMILY MEMBER REGARDING THE PT NEEDED TO R/S HER FLUSH APPT. PER PT'S FAMILY MEMBER THEY WILL GIVE HER THE MESSAGE FOR HER TO CALL us TO R/S THAT APPT.

## 2012-11-16 ENCOUNTER — Telehealth: Payer: Self-pay | Admitting: *Deleted

## 2012-11-16 ENCOUNTER — Encounter: Payer: Self-pay | Admitting: *Deleted

## 2012-11-16 NOTE — Telephone Encounter (Signed)
Pt called requesting status of Jury Duty excuse.  Pt states she is to report on 8.19.2014.  Please advise

## 2012-11-16 NOTE — Telephone Encounter (Signed)
Spoke with pt, she did not have Chief of Staff with her.  States she would call back  On 8.6.14 to give information needed to complete [employer].

## 2012-11-16 NOTE — Telephone Encounter (Signed)
I do not see where this excuse was previosuly requested - Regardless, ok to generate letter from me stating same and I will sign -  Please also let pt know a medical letter may or may not get her "excused" due to new jury requirements thanks

## 2012-11-26 ENCOUNTER — Telehealth: Payer: Self-pay | Admitting: *Deleted

## 2012-11-26 NOTE — Telephone Encounter (Signed)
Patient called and reschduled missed port flush appt.  JMW

## 2012-11-29 ENCOUNTER — Ambulatory Visit (HOSPITAL_BASED_OUTPATIENT_CLINIC_OR_DEPARTMENT_OTHER): Payer: Medicare Other

## 2012-11-29 VITALS — BP 134/90 | HR 83 | Temp 97.2°F

## 2012-11-29 DIAGNOSIS — Z452 Encounter for adjustment and management of vascular access device: Secondary | ICD-10-CM

## 2012-11-29 DIAGNOSIS — C20 Malignant neoplasm of rectum: Secondary | ICD-10-CM

## 2012-11-29 DIAGNOSIS — C19 Malignant neoplasm of rectosigmoid junction: Secondary | ICD-10-CM | POA: Diagnosis not present

## 2012-11-29 MED ORDER — SODIUM CHLORIDE 0.9 % IJ SOLN
10.0000 mL | INTRAMUSCULAR | Status: DC | PRN
  Administered 2012-11-29: 10 mL via INTRAVENOUS
  Filled 2012-11-29: qty 10

## 2012-11-29 MED ORDER — HEPARIN SOD (PORK) LOCK FLUSH 100 UNIT/ML IV SOLN
500.0000 [IU] | Freq: Once | INTRAVENOUS | Status: AC
  Administered 2012-11-29: 500 [IU] via INTRAVENOUS
  Filled 2012-11-29: qty 5

## 2012-11-29 NOTE — Patient Instructions (Addendum)
Implanted Port Instructions  An implanted port is a central line that has a round shape and is placed under the skin. It is used for long-term IV (intravenous) access for:  · Medicine.  · Fluids.  · Liquid nutrition, such as TPN (total parenteral nutrition).  · Blood samples.  Ports can be placed:  · In the chest area just below the collarbone (this is the most common place.)  · In the arms.  · In the belly (abdomen) area.  · In the legs.  PARTS OF THE PORT  A port has 2 main parts:  · The reservoir. The reservoir is round, disc-shaped, and will be a small, raised area under your skin.  · The reservoir is the part where a needle is inserted (accessed) to either give medicines or to draw blood.  · The catheter. The catheter is a long, slender tube that extends from the reservoir. The catheter is placed into a large vein.  · Medicine that is inserted into the reservoir goes into the catheter and then into the vein.  INSERTION OF THE PORT  · The port is surgically placed in either an operating room or in a procedural area (interventional radiology).  · Medicine may be given to help you relax during the procedure.  · The skin where the port will be inserted is numbed (local anesthetic).  · 1 or 2 small cuts (incisions) will be made in the skin to insert the port.  · The port can be used after it has been inserted.  INCISION SITE CARE  · The incision site may have small adhesive strips on it. This helps keep the incision site closed. Sometimes, no adhesive strips are placed. Instead of adhesive strips, a special kind of surgical glue is used to keep the incision closed.  · If adhesive strips were placed on the incision sites, do not take them off. They will fall off on their own.  · The incision site may be sore for 1 to 2 days. Pain medicine can help.  · Do not get the incision site wet. Bathe or shower as directed by your caregiver.  · The incision site should heal in 5 to 7 days. A small scar may form after the  incision has healed.  ACCESSING THE PORT  Special steps must be taken to access the port:  · Before the port is accessed, a numbing cream can be placed on the skin. This helps numb the skin over the port site.  · A sterile technique is used to access the port.  · The port is accessed with a needle. Only "non-coring" port needles should be used to access the port. Once the port is accessed, a blood return should be checked. This helps ensure the port is in the vein and is not clogged (clotted).  · If your caregiver believes your port should remain accessed, a clear (transparent) bandage will be placed over the needle site. The bandage and needle will need to be changed every week or as directed by your caregiver.  · Keep the bandage covering the needle clean and dry. Do not get it wet. Follow your caregiver's instructions on how to take a shower or bath when the port is accessed.  · If your port does not need to stay accessed, no bandage is needed over the port.  FLUSHING THE PORT  Flushing the port keeps it from getting clogged. How often the port is flushed depends on:  · If a   constant infusion is running. If a constant infusion is running, the port may not need to be flushed.  · If intermittent medicines are given.  · If the port is not being used.  For intermittent medicines:  · The port will need to be flushed:  · After medicines have been given.  · After blood has been drawn.  · As part of routine maintenance.  · A port is normally flushed with:  · Normal saline.  · Heparin.  · Follow your caregiver's advice on how often, how much, and the type of flush to use on your port.  IMPORTANT PORT INFORMATION  · Tell your caregiver if you are allergic to heparin.  · After your port is placed, you will get a manufacturer's information card. The card has information about your port. Keep this card with you at all times.  · There are many types of ports available. Know what kind of port you have.  · In case of an  emergency, it may be helpful to wear a medical alert bracelet. This can help alert health care workers that you have a port.  · The port can stay in for as long as your caregiver believes it is necessary.  · When it is time for the port to come out, surgery will be done to remove it. The surgery will be similar to how the port was put in.  · If you are in the hospital or clinic:  · Your port will be taken care of and flushed by a nurse.  · If you are at home:  · A home health care nurse may give medicines and take care of the port.  · You or a family member can get special training and directions for giving medicine and taking care of the port at home.  SEEK IMMEDIATE MEDICAL CARE IF:   · Your port does not flush or you are unable to get a blood return.  · New drainage or pus is coming from the incision.  · A bad smell is coming from the incision site.  · You develop swelling or increased redness at the incision site.  · You develop increased swelling or pain at the port site.  · You develop swelling or pain in the surrounding skin near the port.  · You have an oral temperature above 102° F (38.9° C), not controlled by medicine.  MAKE SURE YOU:   · Understand these instructions.  · Will watch your condition.  · Will get help right away if you are not doing well or get worse.  Document Released: 03/31/2005 Document Revised: 06/23/2011 Document Reviewed: 06/22/2008  Exit[employer]® Patient Information ©2014 [employer].

## 2012-12-07 ENCOUNTER — Telehealth: Payer: Self-pay | Admitting: Internal Medicine

## 2012-12-07 NOTE — Telephone Encounter (Signed)
Spoke with patient and she states she has had ongoing blood from her stoma. She thought she was to report it if it was greater than 3 teaspoons but she reread her information and it says to report 1 teaspoon. She states she has had this much for a long time. She also has noted an place on her stoma that looks like an ulcer and is tender. Offered OV today but patient cannot come today. Scheduled patient on 12/08/12 at 10:30 AM with Willette Cluster, NP.

## 2012-12-08 ENCOUNTER — Ambulatory Visit (INDEPENDENT_AMBULATORY_CARE_PROVIDER_SITE_OTHER): Payer: Medicare Other | Admitting: Nurse Practitioner

## 2012-12-08 ENCOUNTER — Encounter: Payer: Self-pay | Admitting: Nurse Practitioner

## 2012-12-08 VITALS — BP 124/80 | HR 72 | Ht 66.0 in | Wt 178.0 lb

## 2012-12-08 DIAGNOSIS — K9403 Colostomy malfunction: Secondary | ICD-10-CM | POA: Diagnosis not present

## 2012-12-08 NOTE — Patient Instructions (Addendum)
See wound care nurse.  Thank you for coming to our office today.

## 2012-12-08 NOTE — Progress Notes (Signed)
  History of Present Illness:   Patient is a 57 year old female with a history of T2 N1C MO rectal cancer, s/p chemoradiation and anterior resection in 2010. Patient developed diarrhea and fecal incontinence from radiation proctitis. Anti-diarrheals didn't help. Her insurance wouldn't pay for therapy for pelvic floor dysfunction. Patient ultimately referred to [facility] where she underwent diverting colostomy (Dr. Luciano Cutter). In June of this year patient had a CTscan for hematochezia. Findings included marked thickening of segment of transverse colon leading to colostomy. Patient followed by Dr. Gaylyn Rong who has deemed her in remission. Surveillance CTscan due June 2015. She is followed by Dr. Juanda Chance, her last colonoscopy, done September 2013, revealed a double barrel colostomy / normal bowel. I saw the patient for peristomal bleeding in January at which time she had a herniation of bowel though the stoma. I referred back back to Dr. Luciano Cutter. Although I do not do records, patient tells me he did not want to "go back into her colon".    Ms. Cheree Ditto is back with recurrent bleeding. She recently upsized her stoma appliance but still has some peristomal irritation and bleeding. Her stool has always been runny but lately is a little more loose. No recent antibiotics. No abdominal pain.  Patient joined at local support group. Someone in the group recommended she see an ostomy nurse at [facility].   Current Medications, Allergies, Past Medical History, Past Surgical History, Family History and Social History were reviewed in [employer] record.  Physical Exam: General: Well developed , pleasant black female in no acute distress Ears: Normal auditory acuity Lungs: Clear throughout to auscultation Heart: Regular rate and rhythm Abdomen: Soft, non distended, non-tender. No masses, no hepatomegaly. Normal bowel sounds. Several inches of colon protruding from stoma. At the right base of the  protruding colon there is what appears to be granulation tissue.   Musculoskeletal: Symmetrical with no gross deformities  Extremities: No edema  Neurological: Alert oriented x 4, grossly nonfocal Psychological:  Alert and cooperative. Normal mood and affect  Assessment and Recommendations: 86.  57 year old female with a history of T2 N1C MO rectal cancer, s/p chemoradiation and resection with colostomy. Followed by Dr. Gaylyn Rong.  2. Peristomal bleeding, minor. Several inches of bowel has herniated through stoma but surgery does not want to re-operate. Bleeding likely result of local irritation. There appears to be granulation tissue at the right base of herniated bowel (where it exits stoma). Dr. Leone Payor examined the colostomy site today. We both agree with patient's plan to see a wound ostomy nurse at [facility]

## 2012-12-09 ENCOUNTER — Other Ambulatory Visit: Payer: Self-pay | Admitting: Internal Medicine

## 2012-12-09 DIAGNOSIS — K9403 Colostomy malfunction: Secondary | ICD-10-CM | POA: Insufficient documentation

## 2012-12-10 NOTE — Progress Notes (Signed)
Agree with Ms. Guenther's assessment and plan. Ersie Savino E. Tadd Holtmeyer, MD, FACG   

## 2012-12-10 NOTE — Telephone Encounter (Signed)
Faxed script back to [facility].../lmb 

## 2012-12-27 ENCOUNTER — Other Ambulatory Visit: Payer: Self-pay | Admitting: Internal Medicine

## 2013-01-14 ENCOUNTER — Other Ambulatory Visit: Payer: Self-pay | Admitting: Internal Medicine

## 2013-01-14 NOTE — Telephone Encounter (Signed)
Faxed script back to [facility].../lmb 

## 2013-01-14 NOTE — Telephone Encounter (Signed)
MD out of office. Pls advise on refill.../lmb 

## 2013-02-02 ENCOUNTER — Telehealth: Payer: Self-pay | Admitting: Hematology and Oncology

## 2013-02-02 NOTE — Telephone Encounter (Signed)
Pt called today for flush appt and was given appt for 10/27 @ 9:30am - d/t per pt request. Pt was also on schedule for lb/KC 12/5 which was moved to NG w/a flush appt added. Pt aware of change and that she will see NG. Pt given new time for 12/5 lb/flush/NG @ 11:45am.

## 2013-02-07 ENCOUNTER — Encounter (INDEPENDENT_AMBULATORY_CARE_PROVIDER_SITE_OTHER): Payer: Self-pay

## 2013-02-07 ENCOUNTER — Ambulatory Visit (HOSPITAL_BASED_OUTPATIENT_CLINIC_OR_DEPARTMENT_OTHER): Payer: Medicare Other

## 2013-02-07 VITALS — BP 164/97 | HR 52 | Temp 98.5°F | Resp 18

## 2013-02-07 DIAGNOSIS — Z452 Encounter for adjustment and management of vascular access device: Secondary | ICD-10-CM

## 2013-02-07 DIAGNOSIS — C19 Malignant neoplasm of rectosigmoid junction: Secondary | ICD-10-CM | POA: Diagnosis not present

## 2013-02-07 DIAGNOSIS — C20 Malignant neoplasm of rectum: Secondary | ICD-10-CM

## 2013-02-07 MED ORDER — HEPARIN SOD (PORK) LOCK FLUSH 100 UNIT/ML IV SOLN
500.0000 [IU] | Freq: Once | INTRAVENOUS | Status: AC
  Administered 2013-02-07: 500 [IU] via INTRAVENOUS
  Filled 2013-02-07: qty 5

## 2013-02-07 MED ORDER — SODIUM CHLORIDE 0.9 % IJ SOLN
10.0000 mL | INTRAMUSCULAR | Status: DC | PRN
  Administered 2013-02-07: 10 mL via INTRAVENOUS
  Filled 2013-02-07: qty 10

## 2013-03-17 ENCOUNTER — Other Ambulatory Visit: Payer: Self-pay | Admitting: Hematology and Oncology

## 2013-03-17 DIAGNOSIS — Z85048 Personal history of other malignant neoplasm of rectum, rectosigmoid junction, and anus: Secondary | ICD-10-CM

## 2013-03-18 ENCOUNTER — Other Ambulatory Visit: Payer: Medicare Other | Admitting: Lab

## 2013-03-18 ENCOUNTER — Ambulatory Visit (HOSPITAL_BASED_OUTPATIENT_CLINIC_OR_DEPARTMENT_OTHER): Payer: Medicare Other | Admitting: Hematology and Oncology

## 2013-03-18 ENCOUNTER — Ambulatory Visit (HOSPITAL_BASED_OUTPATIENT_CLINIC_OR_DEPARTMENT_OTHER): Payer: Medicare Other

## 2013-03-18 ENCOUNTER — Telehealth: Payer: Self-pay | Admitting: *Deleted

## 2013-03-18 ENCOUNTER — Other Ambulatory Visit (HOSPITAL_BASED_OUTPATIENT_CLINIC_OR_DEPARTMENT_OTHER): Payer: Medicare Other | Admitting: Lab

## 2013-03-18 ENCOUNTER — Telehealth: Payer: Self-pay | Admitting: Hematology and Oncology

## 2013-03-18 ENCOUNTER — Ambulatory Visit: Payer: Medicare Other | Admitting: Oncology

## 2013-03-18 ENCOUNTER — Encounter: Payer: Self-pay | Admitting: Hematology and Oncology

## 2013-03-18 ENCOUNTER — Encounter: Payer: Self-pay | Admitting: Internal Medicine

## 2013-03-18 ENCOUNTER — Ambulatory Visit (INDEPENDENT_AMBULATORY_CARE_PROVIDER_SITE_OTHER): Payer: Medicare Other | Admitting: Internal Medicine

## 2013-03-18 ENCOUNTER — Encounter: Payer: Self-pay | Admitting: *Deleted

## 2013-03-18 VITALS — BP 132/74 | HR 72 | Temp 98.6°F

## 2013-03-18 VITALS — BP 130/72 | HR 69 | Temp 98.3°F | Wt 182.8 lb

## 2013-03-18 VITALS — Resp 18 | Wt 185.2 lb

## 2013-03-18 DIAGNOSIS — C19 Malignant neoplasm of rectosigmoid junction: Secondary | ICD-10-CM

## 2013-03-18 DIAGNOSIS — Z85048 Personal history of other malignant neoplasm of rectum, rectosigmoid junction, and anus: Secondary | ICD-10-CM

## 2013-03-18 DIAGNOSIS — F17200 Nicotine dependence, unspecified, uncomplicated: Secondary | ICD-10-CM

## 2013-03-18 DIAGNOSIS — F329 Major depressive disorder, single episode, unspecified: Secondary | ICD-10-CM | POA: Diagnosis not present

## 2013-03-18 DIAGNOSIS — R197 Diarrhea, unspecified: Secondary | ICD-10-CM

## 2013-03-18 DIAGNOSIS — Z23 Encounter for immunization: Secondary | ICD-10-CM

## 2013-03-18 DIAGNOSIS — C189 Malignant neoplasm of colon, unspecified: Secondary | ICD-10-CM

## 2013-03-18 DIAGNOSIS — E876 Hypokalemia: Secondary | ICD-10-CM

## 2013-03-18 DIAGNOSIS — F32A Depression, unspecified: Secondary | ICD-10-CM

## 2013-03-18 DIAGNOSIS — G620 Drug-induced polyneuropathy: Secondary | ICD-10-CM

## 2013-03-18 DIAGNOSIS — K469 Unspecified abdominal hernia without obstruction or gangrene: Secondary | ICD-10-CM | POA: Insufficient documentation

## 2013-03-18 DIAGNOSIS — I10 Essential (primary) hypertension: Secondary | ICD-10-CM

## 2013-03-18 HISTORY — DX: Unspecified abdominal hernia without obstruction or gangrene: K46.9

## 2013-03-18 LAB — CBC WITH DIFFERENTIAL/PLATELET
BASO%: 0.9 % (ref 0.0–2.0)
Basophils Absolute: 0.1 10*3/uL (ref 0.0–0.1)
EOS%: 1.3 % (ref 0.0–7.0)
Eosinophils Absolute: 0.1 10*3/uL (ref 0.0–0.5)
HCT: 36.6 % (ref 34.8–46.6)
HGB: 12 g/dL (ref 11.6–15.9)
LYMPH%: 20.5 % (ref 14.0–49.7)
MCH: 30 pg (ref 25.1–34.0)
MCHC: 32.8 g/dL (ref 31.5–36.0)
MCV: 91.6 fL (ref 79.5–101.0)
MONO#: 0.4 10*3/uL (ref 0.1–0.9)
MONO%: 6.5 % (ref 0.0–14.0)
NEUT#: 4.5 10*3/uL (ref 1.5–6.5)
NEUT%: 70.8 % (ref 38.4–76.8)
Platelets: 292 10*3/uL (ref 145–400)
RBC: 4 10*6/uL (ref 3.70–5.45)
RDW: 15.2 % — ABNORMAL HIGH (ref 11.2–14.5)
WBC: 6.3 10*3/uL (ref 3.9–10.3)
lymph#: 1.3 10*3/uL (ref 0.9–3.3)

## 2013-03-18 LAB — COMPREHENSIVE METABOLIC PANEL (CC13)
ALT: 25 U/L (ref 0–55)
AST: 22 U/L (ref 5–34)
Albumin: 3.4 g/dL — ABNORMAL LOW (ref 3.5–5.0)
Alkaline Phosphatase: 95 U/L (ref 40–150)
Anion Gap: 10 mEq/L (ref 3–11)
BUN: 12.5 mg/dL (ref 7.0–26.0)
CO2: 19 mEq/L — ABNORMAL LOW (ref 22–29)
Calcium: 9.3 mg/dL (ref 8.4–10.4)
Chloride: 112 mEq/L — ABNORMAL HIGH (ref 98–109)
Creatinine: 0.8 mg/dL (ref 0.6–1.1)
Glucose: 130 mg/dl (ref 70–140)
Potassium: 3.1 mEq/L — ABNORMAL LOW (ref 3.5–5.1)
Sodium: 141 mEq/L (ref 136–145)
Total Bilirubin: 0.47 mg/dL (ref 0.20–1.20)
Total Protein: 6.6 g/dL (ref 6.4–8.3)

## 2013-03-18 MED ORDER — SODIUM CHLORIDE 0.9 % IJ SOLN
10.0000 mL | INTRAMUSCULAR | Status: DC | PRN
  Administered 2013-03-18: 10 mL via INTRAVENOUS
  Filled 2013-03-18: qty 10

## 2013-03-18 MED ORDER — GABAPENTIN 400 MG PO CAPS: 800.0000 mg | ORAL_CAPSULE | Freq: Three times a day (TID) | ORAL | Status: DC

## 2013-03-18 MED ORDER — CITALOPRAM HYDROBROMIDE 20 MG PO TABS: 20.0000 mg | ORAL_TABLET | Freq: Every day | ORAL | Status: DC

## 2013-03-18 MED ORDER — INFLUENZA VAC SPLIT QUAD 0.5 ML IM SUSP
0.5000 mL | INTRAMUSCULAR | Status: AC
Start: 2013-03-19 — End: 2013-03-18
  Administered 2013-03-18: 0.5 mL via INTRAMUSCULAR
  Filled 2013-03-18: qty 0.5

## 2013-03-18 MED ORDER — HEPARIN SOD (PORK) LOCK FLUSH 100 UNIT/ML IV SOLN
500.0000 [IU] | Freq: Once | INTRAVENOUS | Status: AC
  Administered 2013-03-18: 500 [IU] via INTRAVENOUS
  Filled 2013-03-18: qty 5

## 2013-03-18 MED ORDER — ALPRAZOLAM 0.5 MG PO TABS: ORAL_TABLET | ORAL | Status: DC

## 2013-03-18 MED ORDER — LOPERAMIDE HCL 2 MG PO CAPS: 2.0000 mg | ORAL_CAPSULE | Freq: Four times a day (QID) | ORAL | Status: DC | PRN

## 2013-03-18 MED ORDER — AMITRIPTYLINE HCL 25 MG PO TABS: 12.5000 mg | ORAL_TABLET | Freq: Every day | ORAL | Status: DC

## 2013-03-18 MED ORDER — BUPROPION HCL 75 MG PO TABS: 75.0000 mg | ORAL_TABLET | Freq: Two times a day (BID) | ORAL | Status: DC

## 2013-03-18 NOTE — Telephone Encounter (Signed)
Pt states after md write the letter for Housing she would also like a copy so she can have...[name]
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
## 2013-03-18 NOTE — Telephone Encounter (Signed)
Called pt spoke with sister she was not there. Left msg for her to return call...Raechel Chute

## 2013-03-18 NOTE — Patient Instructions (Signed)
It was good to see you today.  We have reviewed your prior records including labs and tests today  increase gabapentin 400 mg capsules -take 2 capsules 3 times daily for pain Resume all medications for depression and anxiety as previously prescribed  Your prescription(s) have been submitted to your pharmacy. Please take as directed and contact our office if you believe you are having problem(s) with the medication(s).  Will generate a letter to [employer] as requested  followup with medical specialist as scheduled and ongoing  Please schedule followup in 6 months to review symptoms and medications, call sooner if problems.
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
## 2013-03-18 NOTE — Progress Notes (Signed)
Pre-visit discussion using our clinic review tool. No additional management support is needed unless otherwise documented below in the visit note.  

## 2013-03-18 NOTE — Assessment & Plan Note (Signed)
History of rectal cancer June 2010 dx. She continues to be in remission.  She did have a routine surveillance colonoscopy back in March 2012. Her next one is due March 2015.  Her last CT abd 09/2012 was negative. Per NCCN guideline, surveillance CT is due yearly; next one is due 09/2013.  Complicated by radiation induced colitis/chronic diarrhea. She is on Lomotil and Imodium prn.  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

## 2013-03-18 NOTE — Progress Notes (Signed)
[facility] OFFICE PROGRESS NOTE  Patient Care Team: Newt Lukes, MD as PCP - General Hart Carwin, MD (Gastroenterology) Exie Parody, MD (Hematology and Oncology) Melony Overly, MD (Obstetrics and Gynecology) Sherin Quarry, DPM (Podiatry)  DIAGNOSIS: History of rectal Cancer, no evidence of disease  SUMMARY OF ONCOLOGIC HISTORY: This is a patient was diagnosed with rectal cancer after presentation with significant rectal bleeding. Colonoscopy & biopsies confirmed cancer. She received neoadjuvant chemotherapy and radiation therapy followed by low anterior resection in November of 2010. 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 Starting in January of 2011 she received adjuvant FOLFOX chemotherapy complicated by peripheral neuropathy. Oxaliplatin  was subsequently discontinued. She completed all chemotherapy by June of 2011. She was operated at [facility] in 2013 due to poor bowel function  INTERVAL HISTORY: [name] 57 y.o. female returns for further followup. She had intermittent diarrhea. Due to high output colostomy, she was placed on Imodium. She continues to smoke but is attempting to quit smoking. She denies any further bleeding. She still persistent peripheral neuropathy but they were improving with Neurontin. She complained of persistent large para stomal hernia that bothers her from time to time. The patient desire to move out from living with her niece. She denies any recent fever, chills, night sweats or abnormal weight loss  I have reviewed the past medical history, past surgical history, social history and family history with the patient and they are unchanged from previous note.  ALLERGIES:  is allergic to ampicillin; hydrocodone; and penicillins.  MEDICATIONS:  Current Outpatient Prescriptions  Medication Sig Dispense Refill  . ALPRAZolam (XANAX) 0.5 MG tablet TAKE ONE  TABLET BY MOUTH ONCE DAILY AT BEDTIME AS NEEDED FOR SLEEP OR ANXIETY  30 tablet  0  . amitriptyline (ELAVIL) 25 MG tablet Take 0.5-1 tablets (12.5-25 mg total) by mouth at bedtime.  30 tablet  5  . buPROPion (WELLBUTRIN) 75 MG tablet Take 1 tablet (75 mg total) by mouth 2 (two) times daily.  60 tablet  5  . citalopram (CELEXA) 20 MG tablet Take 1 tablet (20 mg total) by mouth daily.  30 tablet  5  . gabapentin (NEURONTIN) 400 MG capsule Take 2 capsules (800 mg total) by mouth 3 (three) times daily.  180 capsule  3  . hydrochlorothiazide (HYDRODIURIL) 25 MG tablet Take 1 tablet (25 mg total) by mouth daily.  90 tablet  2  . loperamide (IMODIUM) 2 MG capsule Take 1 capsule (2 mg total) by mouth 4 (four) times daily as needed for diarrhea or loose stools.  30 capsule  0  . Probiotic Product (ALIGN) 4 MG CAPS Take 1 capsule by mouth daily.       No current facility-administered medications for this visit.    REVIEW OF SYSTEMS:   Constitutional: Denies fevers, chills or abnormal weight loss Eyes: Denies blurriness of vision Ears, nose, mouth, throat, and face: Denies mucositis or sore throat Respiratory: Denies cough, dyspnea or wheezes Cardiovascular: Denies palpitation, chest discomfort or lower extremity swelling Gastrointestinal:  Denies nausea, heartburn or change in bowel habits Skin: Denies abnormal skin rashes Lymphatics: Denies new lymphadenopathy or easy bruising Neurological:Denies numbness, tingling or new weaknesses Behavioral/Psych: Mood is stable, no new changes  All other systems were reviewed with the patient and are negative.  PHYSICAL EXAMINATION: ECOG PERFORMANCE STATUS: 0 - Asymptomatic  Filed Vitals:   03/18/13 1201  Resp:  18   Filed Weights   03/18/13 1201  Weight: 185 lb 3.2 oz (84.006 kg)    GENERAL:alert, no distress and comfortable SKIN: skin color, texture, turgor are normal, no rashes or significant lesions EYES: normal, Conjunctiva are pink and  non-injected, sclera clear OROPHARYNX:no exudate, no erythema and lips, buccal mucosa, and tongue normal  NECK: supple, thyroid normal size, non-tender, without nodularity LYMPH:  no palpable lymphadenopathy in the cervical, axillary or inguinal LUNGS: clear to auscultation and percussion with normal breathing effort HEART: regular rate & rhythm and no murmurs and no lower extremity edema ABDOMEN:abdomen soft, non-tender and normal bowel sounds very large abdominal hernia at the site of the stoma  Musculoskeletal:no cyanosis of digits and no clubbing  NEURO: alert & oriented x 3 with fluent speech, no focal motor/sensory deficits  LABORATORY DATA:  I have reviewed the data as listed    Component Value Date/Time   NA 141 03/18/2013 1120   NA 140 10/21/2011 0420   K 3.1* 03/18/2013 1120   K 2.8* 10/21/2011 0420   CL 108* 09/14/2012 1453   CL 103 10/21/2011 0420   CO2 19* 03/18/2013 1120   CO2 24 10/21/2011 0420   GLUCOSE 130 03/18/2013 1120   GLUCOSE 106* 09/14/2012 1453   GLUCOSE 100* 10/21/2011 0420   BUN 12.5 03/18/2013 1120   BUN 9 10/21/2011 0420   CREATININE 0.8 03/18/2013 1120   CREATININE 0.74 10/21/2011 0420   CALCIUM 9.3 03/18/2013 1120   CALCIUM 9.2 10/21/2011 0420   PROT 6.6 03/18/2013 1120   PROT 6.6 10/21/2011 0420   ALBUMIN 3.4* 03/18/2013 1120   ALBUMIN 3.5 10/21/2011 0420   AST 22 03/18/2013 1120   AST 28 10/21/2011 0420   ALT 25 03/18/2013 1120   ALT 21 10/21/2011 0420   ALKPHOS 95 03/18/2013 1120   ALKPHOS 102 10/21/2011 0420   BILITOT 0.47 03/18/2013 1120   BILITOT 0.3 10/21/2011 0420   GFRNONAA >90 10/21/2011 0420   GFRAA >90 10/21/2011 0420    No results found for this basename: SPEP, UPEP,  kappa and lambda light chains    Lab Results  Component Value Date   WBC 6.3 03/18/2013   NEUTROABS 4.5 03/18/2013   HGB 12.0 03/18/2013   HCT 36.6 03/18/2013   MCV 91.6 03/18/2013   PLT 292 03/18/2013      Chemistry      Component Value Date/Time   NA 141 03/18/2013 1120   NA 140 10/21/2011 0420   K  3.1* 03/18/2013 1120   K 2.8* 10/21/2011 0420   CL 108* 09/14/2012 1453   CL 103 10/21/2011 0420   CO2 19* 03/18/2013 1120   CO2 24 10/21/2011 0420   BUN 12.5 03/18/2013 1120   BUN 9 10/21/2011 0420   CREATININE 0.8 03/18/2013 1120   CREATININE 0.74 10/21/2011 0420      Component Value Date/Time   CALCIUM 9.3 03/18/2013 1120   CALCIUM 9.2 10/21/2011 0420   ALKPHOS 95 03/18/2013 1120   ALKPHOS 102 10/21/2011 0420   AST 22 03/18/2013 1120   AST 28 10/21/2011 0420   ALT 25 03/18/2013 1120   ALT 21 10/21/2011 0420   BILITOT 0.47 03/18/2013 1120   BILITOT 0.3 10/21/2011 0420     ASSESSMENT & PLAN:  #1 rectal cancer Clinically she has no evidence of disease recurrence. Her last colonoscopy was in 2012. That was negative. Her last CT scan was normal. I will order CT scan for her next visit. #2 large hernia  I am consulting general surgery for review whether she would benefit from surgery to correct a large hernia. I am concerned about strangulation of the bowels #3 hypokalemia Disease due to diarrhea and a blood pressure medicine. I recommended she increase oral intake of potassium rich food #4 smoking I spent a lot of time educating her the importance of nicotine cessation. The patient is interested to quit with the help of Wellbutrin. #5 poor social circumstances She had some issues living with her niece. 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 We'll continue with Neurontin #7 port access I will consult interventional radiology to remove her port as this is no longer needed #8 preventive care We discussed the importance of preventive care and reviewed the vaccination programs. She does not have any prior allergic reactions to influenza vaccination. She agrees to proceed with influenza vaccination today and we will administer it today at the clinic.  Orders Placed This Encounter  Procedures  . IR Removal Tun Access W/ Port W/O FL     Standing Status: Future     Number of Occurrences:      Standing Expiration Date: 05/19/2014    Order Specific Question:  Reason for exam:    Answer:  no need port anymore    Order Specific Question:  Is the patient pregnant?    Answer:  No    Order Specific Question:  Preferred Imaging Location?    Answer:  [facility]  . CT Chest W Contrast    Standing Status: Future     Number of Occurrences:      Standing Expiration Date: 05/18/2014    Order Specific Question:  Reason for Exam (SYMPTOM  OR DIAGNOSIS REQUIRED)    Answer:  hx rectal ca, r/o recurrence    Order Specific Question:  Is the patient pregnant?    Answer:  No    Order Specific Question:  Preferred imaging location?    Answer:  [facility]  . CT Abdomen Pelvis W Contrast    Standing Status: Future     Number of Occurrences:      Standing Expiration Date: 06/18/2014    Order Specific Question:  Reason for Exam (SYMPTOM  OR DIAGNOSIS REQUIRED)    Answer:  hx rectal ca, r.o recurrence    Order Specific Question:  Is the patient pregnant?    Answer:  No    Order Specific Question:  Preferred imaging location?    Answer:  [facility]  . Comprehensive metabolic panel    Standing Status: Future     Number of Occurrences:      Standing Expiration Date: 03/18/2014  . CBC with Differential    Standing Status: Future     Number of Occurrences:      Standing Expiration Date: 12/08/2013  . CEA    Standing Status: Future     Number of Occurrences:      Standing Expiration Date: 03/18/2014  . Ambulatory referral to General Surgery    Referral Priority:  Routine    Referral Type:  Surgical    Referral Reason:  Specialty Services Required    Requested Specialty:  General Surgery    Number of Visits Requested:  1   All questions were answered. The patient knows to call the clinic with any problems, questions or concerns. No barriers to learning was detected.    [facility], Tomaz Janis, MD 03/18/2013 12:45 PM

## 2013-03-18 NOTE — Telephone Encounter (Signed)
ok 

## 2013-03-18 NOTE — Telephone Encounter (Signed)
gv and printed appt sched and avs for pt for Dec, May and June.Kimberly KitchenMarland KitchenPt sched to see Dr. Maisie Fus on 12.8.14 @ 3:50pm...lvm for IR to port removal.with my and pt info .[name]

## 2013-03-18 NOTE — Assessment & Plan Note (Signed)
The current medical regimen is effective;  continue present plan and medications. BP Readings from Last 3 Encounters:  03/18/13 130/72  02/07/13 164/97  12/08/12 124/80

## 2013-03-18 NOTE — Telephone Encounter (Signed)
Pt return call back will be picking letter up. Niticha put up front for pick-up...lmb

## 2013-03-19 LAB — CEA: CEA: 0.6 ng/mL (ref 0.0–5.0)

## 2013-03-21 ENCOUNTER — Encounter (INDEPENDENT_AMBULATORY_CARE_PROVIDER_SITE_OTHER): Payer: Self-pay | Admitting: General Surgery

## 2013-03-21 ENCOUNTER — Ambulatory Visit (INDEPENDENT_AMBULATORY_CARE_PROVIDER_SITE_OTHER): Payer: Medicare Other | Admitting: General Surgery

## 2013-03-21 ENCOUNTER — Encounter (INDEPENDENT_AMBULATORY_CARE_PROVIDER_SITE_OTHER): Payer: Self-pay

## 2013-03-21 VITALS — BP 120/78 | HR 60 | Temp 97.2°F | Resp 18 | Ht 66.0 in | Wt 181.5 lb

## 2013-03-21 DIAGNOSIS — K469 Unspecified abdominal hernia without obstruction or gangrene: Secondary | ICD-10-CM

## 2013-03-21 DIAGNOSIS — Z85048 Personal history of other malignant neoplasm of rectum, rectosigmoid junction, and anus: Secondary | ICD-10-CM | POA: Diagnosis not present

## 2013-03-21 NOTE — Progress Notes (Signed)
Chief Complaint  Patient presents with  . Hernia    peristomal    HISTORY:  [name] is a 57 y.o. female who presents to clinic with a parastoma hernia.  She is s/p LAR in 2010.  She had a colostomy placed at [facility] in 2012 for incontinence.  She has a parastomal hernia that has been enlarging over the past few years.  She denies any obstuctive symptoms.  She is having trouble pouching the colostomy. She is at the highest number (widest) ostomy aperture and is still having trouble with it fitting.  She will be 5 years cancer free in January.  Past Medical History  Diagnosis Date  . 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 Hypertension   . Diverticulosis   . Radiation proctitis     mild  . Adenomatous colon polyp   . Anemia, unspecified   . 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 Depression   . Nonspecific colitis 10/10/11  . Cocaine abuse     as recently as 10/21/11  . Hernia 03/18/2013  . Need for prophylactic vaccination and inoculation against influenza 03/18/2013  . Neuromuscular disorder        Past Surgical History  Procedure Laterality Date  . Mouth surgery  10/2009  . Low anterior bowel resection  03/07/09  . Colostomy  2013    [facility]  . Portacath placement        Current Outpatient Prescriptions  Medication Sig Dispense Refill  . ALPRAZolam (XANAX) 0.5 MG tablet TAKE ONE TABLET BY MOUTH ONCE DAILY AT BEDTIME AS NEEDED FOR SLEEP OR ANXIETY  30 tablet  0  . amitriptyline (ELAVIL) 25 MG tablet Take 0.5-1 tablets (12.5-25 mg total) by mouth at bedtime.  30 tablet  5  . buPROPion (WELLBUTRIN) 75 MG tablet Take 1 tablet (75 mg total) by mouth 2 (two) times daily.  60 tablet  5  . citalopram (CELEXA) 20 MG tablet Take 1 tablet (20 mg total) by mouth daily.  30 tablet  5  . gabapentin (NEURONTIN) 400 MG capsule Take 2 capsules (800 mg total) by mouth 3 (three) times daily.  180 capsule  3  . hydrochlorothiazide (HYDRODIURIL) 25 MG tablet  Take 1 tablet (25 mg total) by mouth daily.  90 tablet  2  . loperamide (IMODIUM) 2 MG capsule Take 1 capsule (2 mg total) by mouth 4 (four) times daily as needed for diarrhea or loose stools.  30 capsule  0  . Probiotic Product (ALIGN) 4 MG CAPS Take 1 capsule by mouth daily.       No current facility-administered medications for this visit.     Allergies  Allergen Reactions  . Ampicillin Hives  . Hydrocodone Itching  . Penicillins Other (See Comments)    Unknown reaction      Family History  Problem Relation Age of Onset  . Colon cancer Mother   . Stomach cancer Sister   . Colon polyps Sister   . Diabetes Father   . Cirrhosis Brother   . Pulmonary Hypertension Brother       History   Social History  . Marital Status: Divorced    Spouse Name: N/A    Number of Children: 0  . Years of Education: N/A   Occupational History  . disabiled    Social History Main Topics  . Smoking status: Current Every Day Smoker -- 0.30 packs/day for 40 years  Types: Cigarettes  . Smokeless tobacco: Never Used     Comment: form given 04-15-12.divorced, lives with sister Meryl Dare. 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 Alcohol Use: Yes     Comment: "anywhere from a little bit to 1 pint"..."some weeks I dont drink at all and others I drink everyday"  . Drug Use: Yes    Special: Marijuana, Cocaine     Comment: no longer using Marijuana; has occasional cocaine  . Sexual Activity: None   Other Topics Concern  . None   Social History Narrative  . None       REVIEW OF SYSTEMS - PERTINENT POSITIVES ONLY: Review of Systems - General ROS: negative for - chills or fever Respiratory ROS: no cough, shortness of breath, or wheezing Cardiovascular ROS: no chest pain or dyspnea on exertion Gastrointestinal ROS: no abdominal pain, change in bowel habits, or black or bloody stools Genito-Urinary ROS: no dysuria, trouble voiding, or hematuria  EXAM: Filed Vitals:   03/21/13 1611   BP: 120/78  Pulse: 60  Temp: 97.2 F (36.2 C)  Resp: 18    General appearance: alert and cooperative Resp: clear to auscultation bilaterally Cardio: regular rate and rhythm GI: normal findings: soft, non-tender There are 2 prolapsed limbs of the colostomy. They prolapse approximately 15-20 cm.   RADIOLOGY RESULTS:   Images and reports are reviewed. CT scan from December 2013 shows parastomal hernia with colostomy placement just medial to the rectus muscle  ASSESSMENT AND PLAN: [name] is a 57 y.o. female who presents to the office with a large peristomal hernia. I would like to start by repeating her CT scan from last year to further evaluate her hernia anatomy. The options for repair include closing the ostomy, which doesn't seem to be an option given her incontinence, placing a piece of mesh internally by performing a Sugarbaker type repair and then trimming the excess ostomy tissue or changing the site of ostomy to the rectus muscle and converting it to an end ostomy with a Hartmann pouch.  I think the latter of the 3 options would give her the best results long term. I think this is reasonable, considering he has been 5 years since her cancer operation and she has shows no signs of recurrence.  I've asked her to stop smoking, as this will increase her chances of hernia recurrence. I will see her back in one month to see how she is doing with this. I think if she stops smoking completely, we could perform a long lasting repair.    Vanita Panda, MD Colon and Rectal Surgery / General Surgery [facility]      Visit Diagnoses: 1. Parastomal hernia   2. History of rectal cancer     Primary Care Physician: Rene Paci, MD

## 2013-03-21 NOTE — Patient Instructions (Signed)
We strongly recommend that you stop smoking.  Smoking increases the risk of surgery including infection in the form of an open wound, pus formation, abscess, hernia at an incision on the abdomen, etc.  You have an increased risk of other MAJOR complications such as stroke, heart attack, forming clots in the leg and/or lungs, and death.    While it can be one of the most difficult things to do, the [place] community has programs to help you stop.  Consider talking with your primary care physician about options.  Also, Smoking Cessation classes are available through the [facility]:  The smoking cessation program is a proven-effective program from the American [employer] Association. The program is available for anyone 68 and older who currently smokes. The program lasts for 7 weeks and is 8 sessions. Each class will be approximately 1 1/2 hours. The program is every Tuesday.  All classes are 12-1:30pm and same location.  Event Location Information:  Location: [facility] 2nd Floor Conference Room 2-037; located next to [facility] cross streets: [address] [facility] Entrance into the [facility] is adjacent to the [facility] main entrance. The conference room is located on the 2nd floor.  Parking Instructions: Visitor parking is adjacent to [employer] main entrance and the [employer] [phone] or check the Classes and Support Groups   [email or web address] the event of inclemet weather please call [phone] or view online at [email or web address]

## 2013-03-22 ENCOUNTER — Telehealth (INDEPENDENT_AMBULATORY_CARE_PROVIDER_SITE_OTHER): Payer: Self-pay | Admitting: *Deleted

## 2013-03-22 NOTE — Telephone Encounter (Signed)
I spoke with pt and informed her of the appt for her CT scan at [facility] on 12/11 with an arrival time of 8:15am.  I instructed pt on when to drink her contrast as well as to have NO solid foods 4 hours prior to scan.  Pt is agreeable with this appt and instructions given.

## 2013-03-24 ENCOUNTER — Encounter (HOSPITAL_COMMUNITY): Payer: Self-pay

## 2013-03-24 ENCOUNTER — Ambulatory Visit (HOSPITAL_COMMUNITY)
Admission: RE | Admit: 2013-03-24 | Discharge: 2013-03-24 | Disposition: A | Discharge status: Home / Self Care | Payer: Medicare Other | Source: Ambulatory Visit | Attending: General Surgery | Admitting: General Surgery

## 2013-03-24 DIAGNOSIS — Z85048 Personal history of other malignant neoplasm of rectum, rectosigmoid junction, and anus: Secondary | ICD-10-CM | POA: Diagnosis not present

## 2013-03-24 DIAGNOSIS — M51379 Other intervertebral disc degeneration, lumbosacral region without mention of lumbar back pain or lower extremity pain: Secondary | ICD-10-CM | POA: Insufficient documentation

## 2013-03-24 DIAGNOSIS — Z9221 Personal history of antineoplastic chemotherapy: Secondary | ICD-10-CM | POA: Insufficient documentation

## 2013-03-24 DIAGNOSIS — IMO0002 Reserved for concepts with insufficient information to code with codable children: Secondary | ICD-10-CM | POA: Insufficient documentation

## 2013-03-24 DIAGNOSIS — Z933 Colostomy status: Secondary | ICD-10-CM | POA: Diagnosis not present

## 2013-03-24 DIAGNOSIS — R188 Other ascites: Secondary | ICD-10-CM | POA: Insufficient documentation

## 2013-03-24 DIAGNOSIS — M5137 Other intervertebral disc degeneration, lumbosacral region: Secondary | ICD-10-CM | POA: Insufficient documentation

## 2013-03-24 DIAGNOSIS — R109 Unspecified abdominal pain: Secondary | ICD-10-CM | POA: Diagnosis not present

## 2013-03-24 MED ORDER — IOHEXOL 300 MG/ML  SOLN
100.0000 mL | Freq: Once | INTRAMUSCULAR | Status: AC | PRN
  Administered 2013-03-24: 100 mL via INTRAVENOUS

## 2013-03-25 IMAGING — CT CT CHEST W/ CM
2 of 5 series · 12 of 36 positions shown, 19 images · IV contrast (READICAT/WATER & [ID] OMNI 300)
Comparison: 11/27/2009

CT CHEST

CLINICAL DATA: Chronic abdominal pain.  History of rectal cancer.
Smoker.

CT CHEST, ABDOMEN AND PELVIS WITH CONTRAST
TECHNIQUE: Multidetector CT imaging of the chest, abdomen and
pelvis was performed following the standard protocol during bolus
administration of intravenous contrast.
Contrast: 100 ml Omnipaque 300 IV.

[Series 601: coronal body · coronal · 1.23mm/px · 1 of 149 slices shown, 2 images]
[im 50/149  soft-tissue]
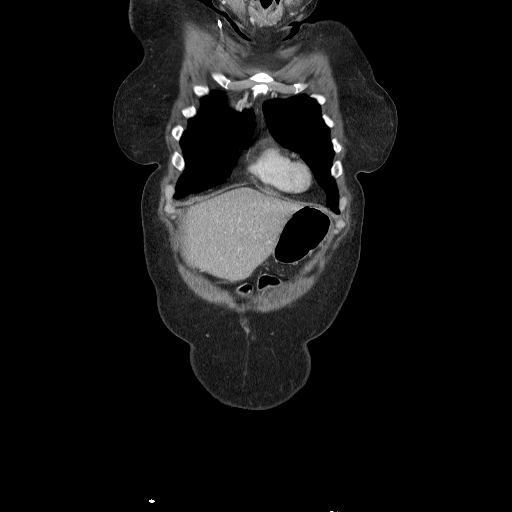
[im 50/149  bone]
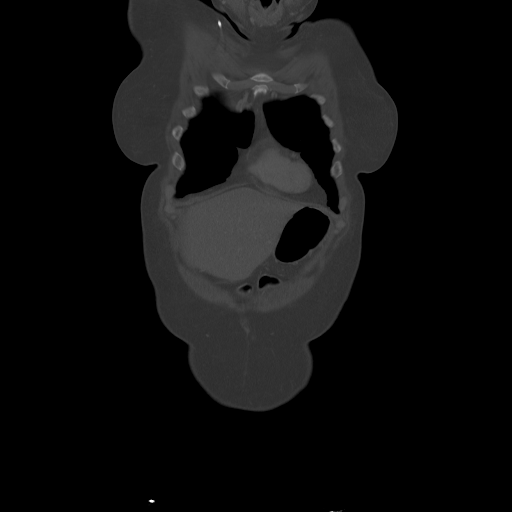

[Series 602: sagittal body · sagittal · 1.23mm/px · 11 of 159 slices shown, 17 images]
[im 13/159  soft-tissue]
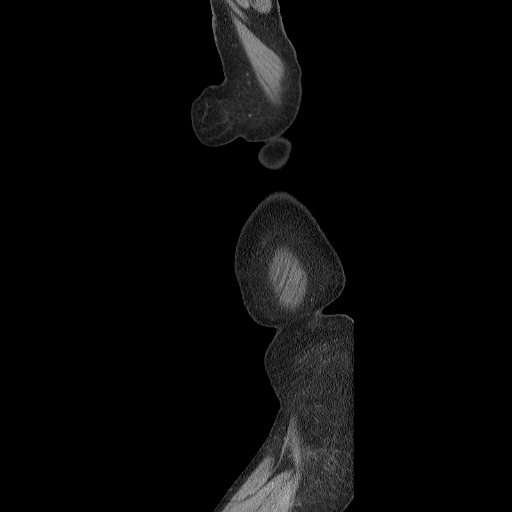
[im 13/159  lung]
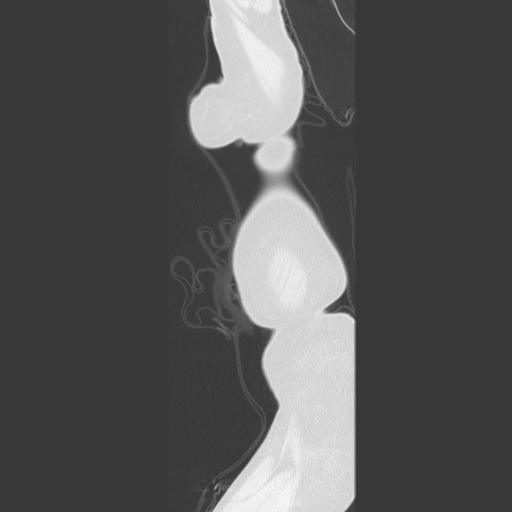
[im 13/159  bone]
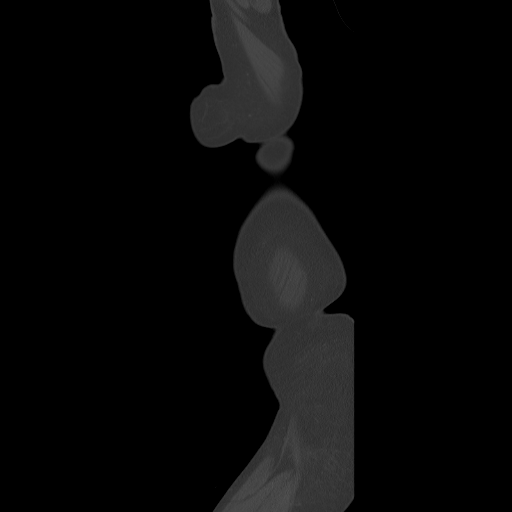
[im 25/159  soft-tissue]
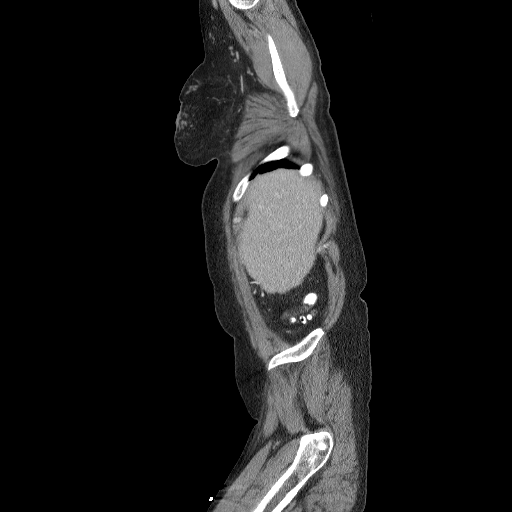
[im 25/159  lung]
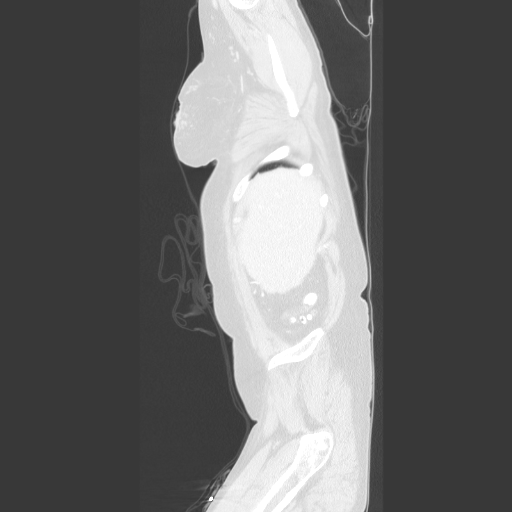
[im 37/159  soft-tissue]
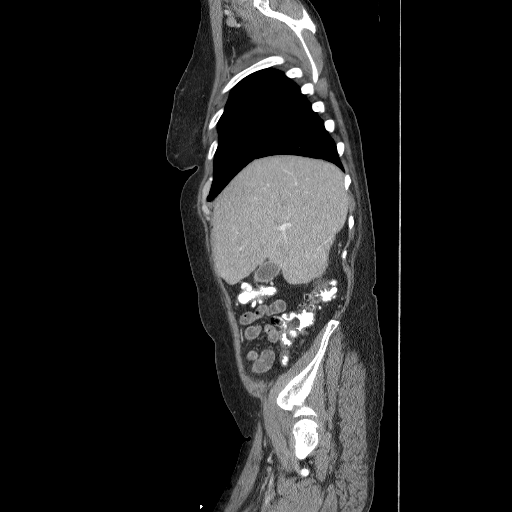
[im 37/159  lung]
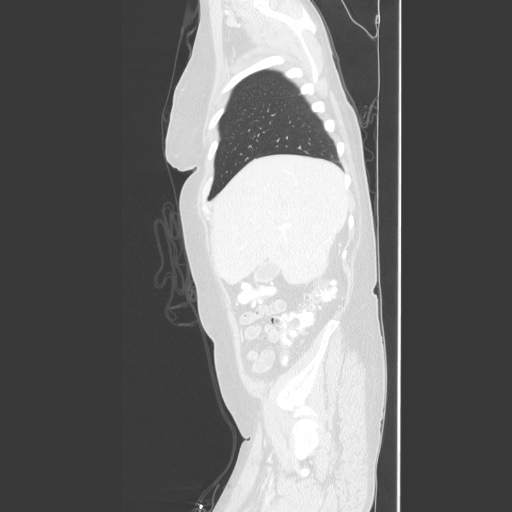
[im 49/159  soft-tissue]
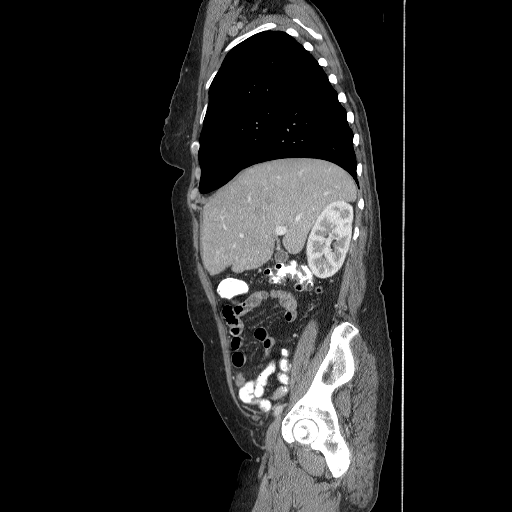
[im 49/159  lung]
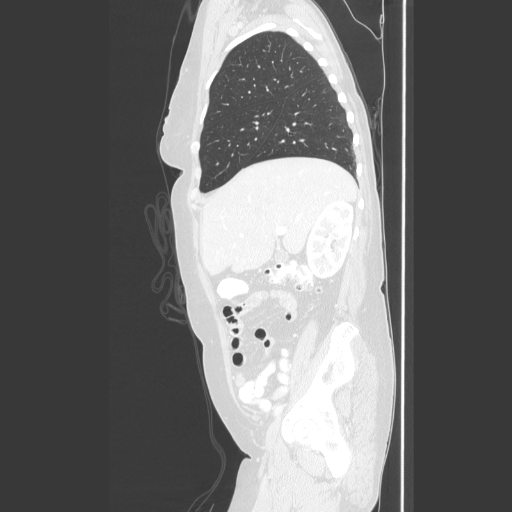
[im 61/159  soft-tissue]
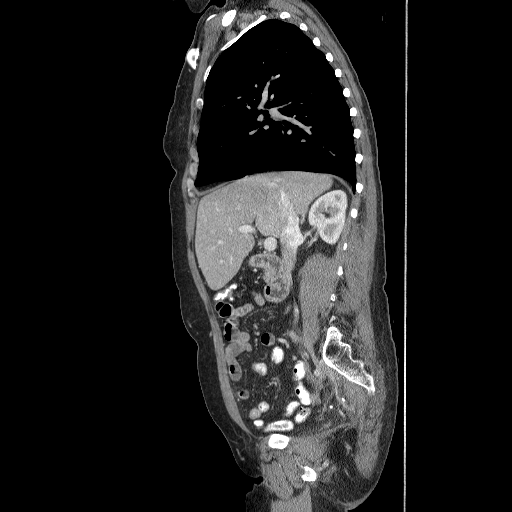
[im 86/159  soft-tissue]
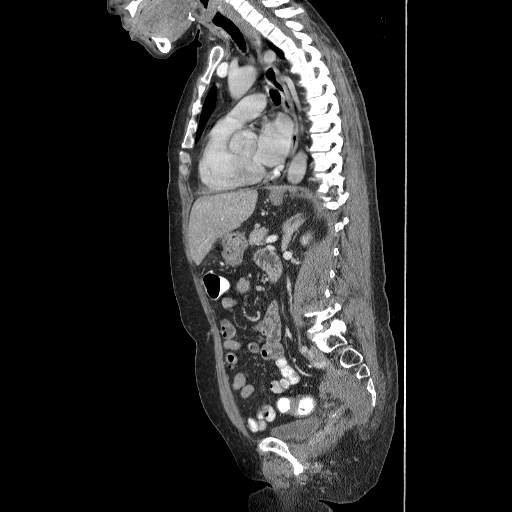
[im 98/159  soft-tissue]
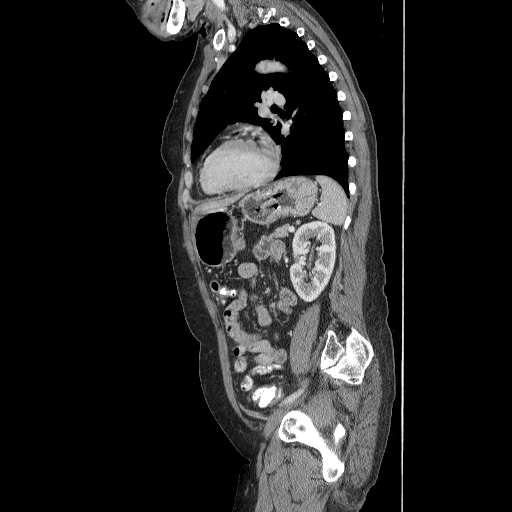
[im 110/159  soft-tissue]
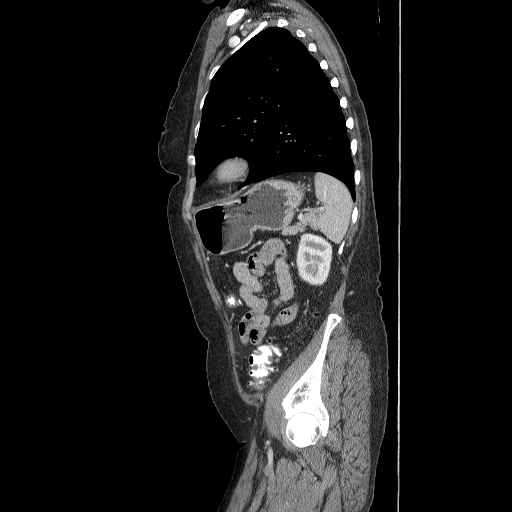
[im 122/159  soft-tissue]
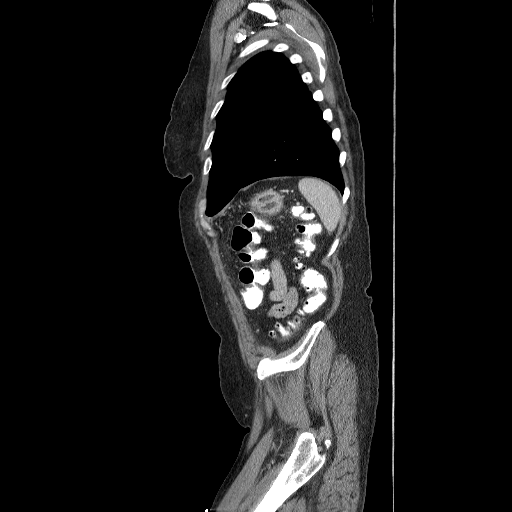
[im 122/159  bone]
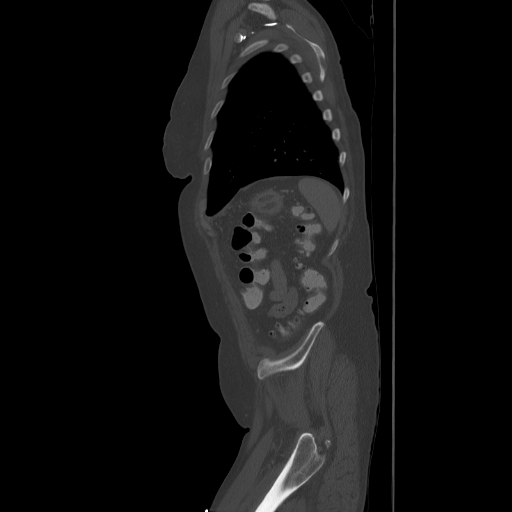
[im 134/159  soft-tissue]
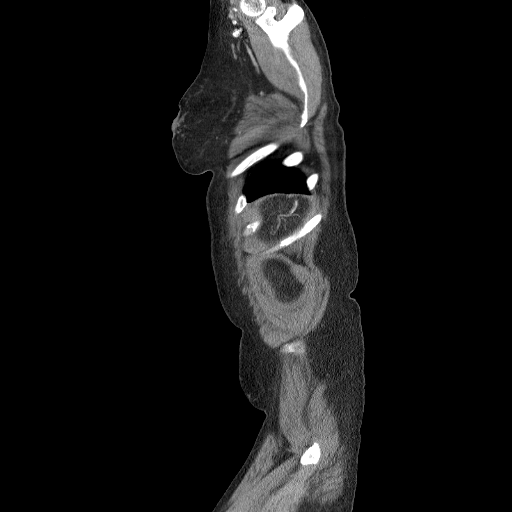
[im 146/159  soft-tissue]
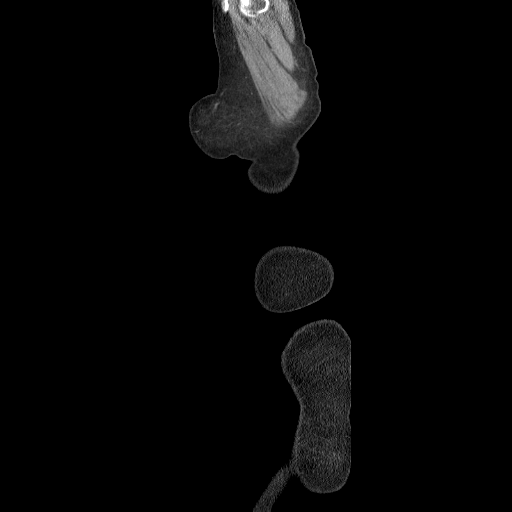

[12 of 36 positions shown; findings below may reference images not displayed]

FINDINGS: Lungs are clear.  No focal airspace opacities or
suspicious nodules.  No effusions. Heart is normal size. Aorta is
normal caliber. No mediastinal, hilar, or axillary adenopathy.
Right Port-A-Cath is in place with the tip at the cavoatrial
junction. Visualized thyroid and chest wall soft tissues
unremarkable.  Incidentally noted is a retroesophageal right
subclavian artery.

No acute bony abnormality.
IMPRESSION: No active cardiopulmonary disease.

CT ABDOMEN AND PELVIS
FINDINGS: Liver, gallbladder, spleen, pancreas, adrenals, kidneys
are normal.  There is diffuse colonic diverticulosis.  No evidence
of active diverticulitis.  Small bowel is decompressed.  Aorta is
normal caliber.

Postoperative changes noted in the rectum.  There is stable
presacral soft tissue swelling.  Stable low density area within the
presacral presacral soft tissues.  This is presumably related to
prior surgery and/or radiation and is unchanged.  No acute bony
abnormality.  Degenerative changes in the lumbar spine.
IMPRESSION: Stable postoperative changes in the rectum.  Stable presacral soft
tissue thickening and focal low density fluid collection.  This is
presumably postoperative/post radiation.

Diffuse colonic diverticulosis.  No active diverticulitis.

No acute findings in the abdomen or pelvis.

## 2013-03-29 ENCOUNTER — Other Ambulatory Visit (HOSPITAL_COMMUNITY): Payer: Self-pay | Admitting: Radiology

## 2013-03-29 ENCOUNTER — Other Ambulatory Visit: Payer: Self-pay | Admitting: Radiology

## 2013-04-01 ENCOUNTER — Ambulatory Visit (HOSPITAL_COMMUNITY)
Admission: RE | Admit: 2013-04-01 | Discharge: 2013-04-01 | Disposition: A | Discharge status: Home / Self Care | Payer: Medicare Other | Source: Ambulatory Visit | Attending: Hematology and Oncology | Admitting: Hematology and Oncology

## 2013-04-01 ENCOUNTER — Encounter (HOSPITAL_COMMUNITY): Payer: Self-pay

## 2013-04-01 DIAGNOSIS — F17200 Nicotine dependence, unspecified, uncomplicated: Secondary | ICD-10-CM | POA: Insufficient documentation

## 2013-04-01 DIAGNOSIS — I10 Essential (primary) hypertension: Secondary | ICD-10-CM | POA: Diagnosis not present

## 2013-04-01 DIAGNOSIS — Z79899 Other long term (current) drug therapy: Secondary | ICD-10-CM | POA: Diagnosis not present

## 2013-04-01 DIAGNOSIS — Z452 Encounter for adjustment and management of vascular access device: Secondary | ICD-10-CM | POA: Diagnosis not present

## 2013-04-01 DIAGNOSIS — Z85048 Personal history of other malignant neoplasm of rectum, rectosigmoid junction, and anus: Secondary | ICD-10-CM | POA: Diagnosis not present

## 2013-04-01 LAB — BASIC METABOLIC PANEL
BUN: 10 mg/dL (ref 6–23)
CO2: 23 mEq/L (ref 19–32)
Calcium: 9.5 mg/dL (ref 8.4–10.5)
Chloride: 99 mEq/L (ref 96–112)
Creatinine, Ser: 0.65 mg/dL (ref 0.50–1.10)
GFR calc Af Amer: >90 mL/min (ref >90)
GFR calc non Af Amer: >90 mL/min (ref >90)
Glucose, Bld: 158 mg/dL — ABNORMAL HIGH (ref 70–99)
Potassium: 3.5 mEq/L (ref 3.5–5.1)
Sodium: 136 mEq/L (ref 135–145)

## 2013-04-01 LAB — CBC
HCT: 37.5 % (ref 36.0–46.0)
Hemoglobin: 12.5 g/dL (ref 12.0–15.0)
MCH: 29.5 pg (ref 26.0–34.0)
MCHC: 33.3 g/dL (ref 30.0–36.0)
MCV: 88.4 fL (ref 78.0–100.0)
Platelets: 258 10*3/uL (ref 150–400)
RBC: 4.24 MIL/uL (ref 3.87–5.11)
RDW: 13.9 % (ref 11.5–15.5)
WBC: 5.1 10*3/uL (ref 4.0–10.5)

## 2013-04-01 LAB — PROTIME-INR
INR: 0.91 (ref 0.00–1.49)
Prothrombin Time: 12.1 seconds (ref 11.6–15.2)

## 2013-04-01 LAB — APTT: aPTT: 30 seconds (ref 24–37)

## 2013-04-01 MED ORDER — MIDAZOLAM HCL 2 MG/2ML IJ SOLN
INTRAMUSCULAR | Status: AC
  Filled 2013-04-01: qty 2

## 2013-04-01 MED ORDER — SODIUM CHLORIDE 0.9 % IV SOLN
Freq: Once | INTRAVENOUS | Status: AC
  Administered 2013-04-01: 08:00:00 via INTRAVENOUS

## 2013-04-01 MED ORDER — FENTANYL CITRATE 0.05 MG/ML IJ SOLN
INTRAMUSCULAR | Status: AC
  Filled 2013-04-01: qty 2

## 2013-04-01 MED ORDER — VANCOMYCIN HCL IN DEXTROSE 1-5 GM/200ML-% IV SOLN
1000.0000 mg | Freq: Once | INTRAVENOUS | Status: AC
  Administered 2013-04-01: 1000 mg via INTRAVENOUS
  Filled 2013-04-01: qty 200

## 2013-04-01 NOTE — Procedures (Signed)
Procedure:  Removal of right chest port. Findings:  Right port removed in entirety.  Wound closed.

## 2013-04-01 NOTE — H&P (Signed)
[name] is an 57 y.o. female.   Chief Complaint: "I'm getting my port out" HPI: Patient with history of rectal carcinoma and currently without evidence of disease recurrence presents today for port a cath removal.  Past Medical History  Diagnosis Date  . 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 Hypertension   . Diverticulosis   . Radiation proctitis     mild  . Adenomatous colon polyp   . Anemia, unspecified   . Depression   . Nonspecific colitis 10/10/11  . Cocaine abuse     as recently as 10/21/11  . Hernia 03/18/2013  . Need for prophylactic vaccination and inoculation against influenza 03/18/2013  . Neuromuscular disorder   . 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 Mouth surgery  10/2009  . Low anterior bowel resection  03/07/09  . Colostomy  2013    [facility]  . Portacath placement      Family History  Problem Relation Age of Onset  . Colon cancer Mother   . Stomach cancer Sister   . Colon polyps Sister   . Diabetes Father   . Cirrhosis Brother   . Pulmonary Hypertension Brother    Social History:  reports that she has been smoking Cigarettes.  She has a 12 pack-year smoking history. She has never used smokeless tobacco. She reports that she drinks alcohol. She reports that she uses illicit drugs (Marijuana and Cocaine).  Allergies:  Allergies  Allergen Reactions  . Ampicillin Hives  . Hydrocodone Itching  . Penicillins Other (See Comments)    Unknown reaction    Current outpatient prescriptions:ALPRAZolam (XANAX) 0.5 MG tablet, TAKE ONE TABLET BY MOUTH ONCE DAILY AT BEDTIME AS NEEDED FOR SLEEP OR ANXIETY, Disp: 30 tablet, Rfl: 0;  amitriptyline (ELAVIL) 25 MG tablet, Take 0.5-1 tablets (12.5-25 mg total) by mouth at bedtime., Disp: 30 tablet, Rfl: 5;  buPROPion (WELLBUTRIN) 75 MG tablet, Take 1 tablet (75 mg total) by mouth 2 (two) times daily., Disp: 60 tablet, Rfl:  5 citalopram (CELEXA) 20 MG tablet, Take 1 tablet (20 mg total) by mouth daily., Disp: 30 tablet, Rfl: 5;  gabapentin (NEURONTIN) 400 MG capsule, Take 2 capsules (800 mg total) by mouth 3 (three) times daily., Disp: 180 capsule, Rfl: 3;  hydrochlorothiazide (HYDRODIURIL) 25 MG tablet, Take 1 tablet (25 mg total) by mouth daily., Disp: 90 tablet, Rfl: 2 loperamide (IMODIUM) 2 MG capsule, Take 1 capsule (2 mg total) by mouth 4 (four) times daily as needed for diarrhea or loose stools., Disp: 30 capsule, Rfl: 0;  Probiotic Product (ALIGN) 4 MG CAPS, Take 1 capsule by mouth daily., Disp: , Rfl:  Current facility-administered medications:fentaNYL (SUBLIMAZE) 0.05 MG/ML injection, , , , ;  midazolam (VERSED) 2 MG/2ML injection, , , , ;  vancomycin (VANCOCIN) IVPB 1000 mg/200 mL premix, 1,000 mg, Intravenous, Once, Robet Leu, PA-C   Results for orders placed during the hospital encounter of 04/01/13 (from the past 48 hour(s))  APTT     Status: None   Collection Time    04/01/13  7:50 AM      Result Value Range   aPTT 30  24 - 37 seconds  BASIC METABOLIC PANEL     Status: Abnormal   Collection Time    04/01/13  7:50 AM      Result Value Range   Sodium 136  135 -  145 mEq/L   Potassium 3.5  3.5 - 5.1 mEq/L   Chloride 99  96 - 112 mEq/L   CO2 23  19 - 32 mEq/L   Glucose, Bld 158 (*) 70 - 99 mg/dL   BUN 10  6 - 23 mg/dL   Creatinine, Ser 1.91  0.50 - 1.10 mg/dL   Calcium 9.5  8.4 - 47.8 mg/dL   GFR calc non Af Amer >90  >90 mL/min   GFR calc Af Amer >90  >90 mL/min   Comment: (NOTE)     The eGFR has been calculated using the CKD EPI equation.     This calculation has not been validated in all clinical situations.     eGFR's persistently <90 mL/min signify possible Chronic Kidney     Disease.  CBC     Status: None   Collection Time    04/01/13  7:50 AM      Result Value Range   WBC 5.1  4.0 - 10.5 K/uL   RBC 4.24  3.87 - 5.11 MIL/uL   Hemoglobin 12.5  12.0 - 15.0 g/dL   HCT 29.5  62.1  - 30.8 %   MCV 88.4  78.0 - 100.0 fL   MCH 29.5  26.0 - 34.0 pg   MCHC 33.3  30.0 - 36.0 g/dL   RDW 65.7  84.6 - 96.2 %   Platelets 258  150 - 400 K/uL  PROTIME-INR     Status: None   Collection Time    04/01/13  7:50 AM      Result Value Range   Prothrombin Time 12.1  11.6 - 15.2 seconds   INR 0.91  0.00 - 1.49   No results found.  Review of Systems  Constitutional: Negative for fever and chills.  Respiratory: Negative for cough and shortness of breath.   Cardiovascular: Negative for chest pain.  Gastrointestinal: Negative for nausea and vomiting.       Occ abd pain secondary to parastomal hernia  Musculoskeletal: Negative for back pain.  Neurological: Negative for headaches.       Occ peripheral neuropathy    Blood pressure 114/69, pulse 72, temperature 98.2 F (36.8 C), temperature source Oral, resp. rate 18, SpO2 96.00%. Physical Exam  Constitutional: She is oriented to person, place, and time. She appears well-developed and well-nourished.  Cardiovascular: Normal rate and regular rhythm.   Respiratory: Effort normal and breath sounds normal.  Clean, intact rt upper chest wall PAC  GI: Soft. Bowel sounds are normal.  Large parastomal hernia present  Musculoskeletal: Normal range of motion. She exhibits no edema.  Neurological: She is alert and oriented to person, place, and time.     Assessment/Plan Pt with hx of rectal carcinoma, currently without evidence of recurrence. Plan is for port a cath removal today. Details/risks of procedure d/w pt with her understanding and consent.  Rakhi Romagnoli,D KEVIN 04/01/2013, 9:04 AM

## 2013-04-01 NOTE — H&P (Signed)
Agree 

## 2013-04-19 ENCOUNTER — Encounter (INDEPENDENT_AMBULATORY_CARE_PROVIDER_SITE_OTHER): Payer: Self-pay

## 2013-04-19 ENCOUNTER — Ambulatory Visit (INDEPENDENT_AMBULATORY_CARE_PROVIDER_SITE_OTHER): Payer: Medicare Other | Admitting: General Surgery

## 2013-04-19 ENCOUNTER — Encounter (INDEPENDENT_AMBULATORY_CARE_PROVIDER_SITE_OTHER): Payer: Self-pay | Admitting: General Surgery

## 2013-04-19 VITALS — BP 140/90 | HR 72 | Temp 98.9°F | Resp 14 | Ht 66.0 in | Wt 185.4 lb

## 2013-04-19 DIAGNOSIS — K432 Incisional hernia without obstruction or gangrene: Secondary | ICD-10-CM

## 2013-04-19 DIAGNOSIS — K435 Parastomal hernia without obstruction or  gangrene: Secondary | ICD-10-CM

## 2013-04-19 NOTE — Patient Instructions (Signed)
Continue to work on quitting smoking.  We will schedule you for surgery.

## 2013-04-19 NOTE — Progress Notes (Signed)
[name] is a 58 y.o. female who is here for a follow up visit regarding her parastomal hernia.  She has been working on quitting smoking.  She is down to 1-2 cigarettes a day.  She has gained about 3 lbs over the last month.    Objective: Filed Vitals:   04/19/13 1205  BP: 140/90  Pulse: 72  Temp: 98.9 F (37.2 C)  Resp: 14    General appearance: alert and cooperative GI: normal findings: soft, non-tender large prolapsing loop colostomy with parastomal hernia   CT IMPRESSION:  1. No evidence of metastatic disease status post distal colon  resection and transverse colostomy.  2. Large parastomal herniation of small bowel adjacent to the  transverse colostomy, not significantly changed from the most recent  study. No evidence of incarceration or bowel obstruction.  3. 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    Assessment and Plan: [name] Is a 58 year old female with a large peristomal hernia. She is here to discuss surgical options. She is in the last month working on quitting smoking. She is down to 1-2 cigarettes a day. On exam she has a large prolapsing loop colostomy with a peristomal hernia. CT scan shows no evidence of metastatic disease and a large peristomal hernia with small bowel and transverse colon. We discussed surgical options. Options include moving the ostomy to another site and inverting this into an end colostomy. We could also reduce her current hernia and convert to an end colostomy and reinforce her current ostomy with a mesh placement in a Sugarbaker type repair.  She is open to any of these options. I told her we would definitely convert her loop colostomy into an end colostomy given the signs of recurrent rectal disease. I also offered her closure of her loop colostomy as well. She declined this due to the problem she was having with incontinence. We discussed the risk of surgery which include pain, bleeding, damage to adjacent  structures, bowel obstructions, recurrent hernias and infections of mesh. She understands that we will not have a final decision until we are in the operating room and can take a look at the inside of her abdominal wall. We will decide exactly where her ostomy will be placed once we evaluate everything completely internally. I will have her marked by the ostomy nurse preoperatively so that I may give her the best option possible. I think with quitting smoking and this hernia repair we should be able to minimize her chances of recurrence.    Rosario Adie, [facility], [place]

## 2013-05-03 ENCOUNTER — Encounter (HOSPITAL_COMMUNITY): Payer: Self-pay | Admitting: Pharmacy Technician

## 2013-05-05 ENCOUNTER — Encounter (HOSPITAL_COMMUNITY): Payer: Self-pay

## 2013-05-05 ENCOUNTER — Ambulatory Visit (HOSPITAL_COMMUNITY)
Admission: RE | Admit: 2013-05-05 | Discharge: 2013-05-05 | Disposition: A | Discharge status: Home / Self Care | Payer: Medicare Other | Source: Ambulatory Visit | Attending: General Surgery | Admitting: General Surgery

## 2013-05-05 ENCOUNTER — Encounter (HOSPITAL_COMMUNITY)
Admission: RE | Admit: 2013-05-05 | Discharge: 2013-05-05 | Disposition: A | Discharge status: Home / Self Care | Payer: Medicare Other | Source: Ambulatory Visit | Attending: General Surgery | Admitting: General Surgery

## 2013-05-05 DIAGNOSIS — Z85048 Personal history of other malignant neoplasm of rectum, rectosigmoid junction, and anus: Secondary | ICD-10-CM | POA: Diagnosis not present

## 2013-05-05 DIAGNOSIS — I517 Cardiomegaly: Secondary | ICD-10-CM | POA: Insufficient documentation

## 2013-05-05 DIAGNOSIS — IMO0002 Reserved for concepts with insufficient information to code with codable children: Secondary | ICD-10-CM | POA: Diagnosis not present

## 2013-05-05 DIAGNOSIS — Z0183 Encounter for blood typing: Secondary | ICD-10-CM | POA: Insufficient documentation

## 2013-05-05 DIAGNOSIS — I771 Stricture of artery: Secondary | ICD-10-CM | POA: Insufficient documentation

## 2013-05-05 DIAGNOSIS — I10 Essential (primary) hypertension: Secondary | ICD-10-CM | POA: Insufficient documentation

## 2013-05-05 DIAGNOSIS — Z01812 Encounter for preprocedural laboratory examination: Secondary | ICD-10-CM | POA: Insufficient documentation

## 2013-05-05 DIAGNOSIS — Z01818 Encounter for other preprocedural examination: Secondary | ICD-10-CM | POA: Insufficient documentation

## 2013-05-05 DIAGNOSIS — Z933 Colostomy status: Secondary | ICD-10-CM | POA: Insufficient documentation

## 2013-05-05 DIAGNOSIS — Z01810 Encounter for preprocedural cardiovascular examination: Secondary | ICD-10-CM | POA: Insufficient documentation

## 2013-05-05 HISTORY — DX: Full incontinence of feces: R15.9

## 2013-05-05 HISTORY — DX: Personal history of other medical treatment: Z92.89

## 2013-05-05 HISTORY — DX: Sleep disorder, unspecified: G47.9

## 2013-05-05 LAB — BASIC METABOLIC PANEL
BUN: 12 mg/dL (ref 6–23)
CO2: 24 mEq/L (ref 19–32)
Calcium: 9.2 mg/dL (ref 8.4–10.5)
Chloride: 103 mEq/L (ref 96–112)
Creatinine, Ser: 0.74 mg/dL (ref 0.50–1.10)
GFR calc Af Amer: >90 mL/min (ref >90)
GFR calc non Af Amer: >90 mL/min (ref >90)
Glucose, Bld: 107 mg/dL — ABNORMAL HIGH (ref 70–99)
Potassium: 3.4 mEq/L — ABNORMAL LOW (ref 3.7–5.3)
Sodium: 140 mEq/L (ref 137–147)

## 2013-05-05 LAB — CBC
HCT: 36.4 % (ref 36.0–46.0)
Hemoglobin: 12 g/dL (ref 12.0–15.0)
MCH: 29.5 pg (ref 26.0–34.0)
MCHC: 33 g/dL (ref 30.0–36.0)
MCV: 89.4 fL (ref 78.0–100.0)
Platelets: 282 10*3/uL (ref 150–400)
RBC: 4.07 MIL/uL (ref 3.87–5.11)
RDW: 13.8 % (ref 11.5–15.5)
WBC: 7 10*3/uL (ref 4.0–10.5)

## 2013-05-05 NOTE — Patient Instructions (Addendum)
[name]  05/05/2013                           YOUR PROCEDURE IS SCHEDULED ON: 05/12/13               PLEASE REPORT TO SHORT STAY CENTER AT : 8:30am               CALL THIS NUMBER IF ANY PROBLEMS THE DAY OF SURGERY :               [phone]                      REMEMBER:   Do not eat food or drink liquids AFTER MIDNIGHT   Take these medicines the morning of surgery with A SIP OF WATER: GABAPENTIN / CELEXA   Do not wear jewelry, make-up   Do not wear lotions, powders, or perfumes.   Do not shave legs or underarms 12 hrs. before surgery (men may shave face)  Do not bring valuables to the hospital.  Contacts, dentures or bridgework may not be worn into surgery.  Leave suitcase in the car. After surgery it may be brought to your room.  For patients admitted to the hospital more than one night, checkout time is 11:00                          The day of discharge.   Patients discharged the day of surgery will not be allowed to drive home                             If going home same day of surgery, must have someone stay with you first                           24 hrs at home and arrange for some one to drive you home from hospital.    Special Instructions:   Please read over the following fact sheets that you were given:                            1. [name]                                                X_____________________________________________________________________        Failure to follow these instructions may result in cancellation of your surgery

## 2013-05-05 NOTE — Consult Note (Signed)
[name] ostomy consult note Patient seen today for preoperative stoma site selection prior to surgery on 05/12/13.  Dr. Marcello Moores' note appreciated. Patient and I discussed her hopes and goals for ostomy revision surgery and hernia repair. Patient wears a 4 inch, 2-piece [employer]va[employer]ec pouching system and the loop stoma barely fits inside the 4-inch ring.  It is clear that her large loop stoma that prolapses and parastoma hernia have had an impact on her self-esteem and quality of life.She is hopeful for a successful revision and believes that either option for relocation: the right side or left, will have a positive impact on her future. I assessed the patient in the sitting and standing positions.  She indicated where she would like to wear her clacks (at her natural waist, slightly above her umbilicus) and notes that this had not been possible since her surgery in 2012. I have marked [name] on the right at 6.5cm to the right of the umbilicus and 1cm below and on the left at 8.5cm to the left of the umbilicus and 1cm below. A surgical skin marking pen is used for the mark and the marks are covered with a thin-film transparent dressing.   Education provided: patient is reassured that if dressing comes off or if marks are removed by routine washing that this note will guide the surgeon to the places marked preoperatively. Patient and I discussed her large volume of supplies at home; she is instructed not to purchase any more and taught that [facility], her provider, will often exchange pouching supplies if the boxers are unopened for her new supplies.   The [facility] team looks forward to following this nice patient and to working with her post-operatively. Thank you for this referral. Thanks, Maudie Flakes, MSN, RN, [place], [place], [place] [phone])

## 2013-05-07 IMAGING — CT CT ABD-PELV W/ CM
1 of 3 series · 13 of 32 positions shown, 18 images · IV contrast (OMNIPAQUE 300)
Comparison: 04/28/2011

CLINICAL DATA: Blood in colostomy bag.

CT ABDOMEN AND PELVIS WITH CONTRAST
TECHNIQUE: Multidetector CT imaging of the abdomen and pelvis was
performed following the standard protocol during bolus
administration of intravenous contrast.
Contrast: 100mL OMNIPAQUE IOHEXOL 300 MG/ML  SOLN

[Series 2: abd/pel with · axial · 0.82mm/px · z∈[+1266,+1631]mm · 13 of 83 slices shown, 18 images]
[im 5/83  soft-tissue]
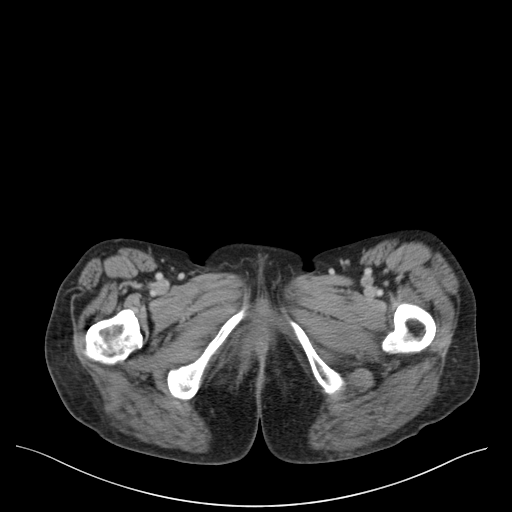
[im 5/83  bone]
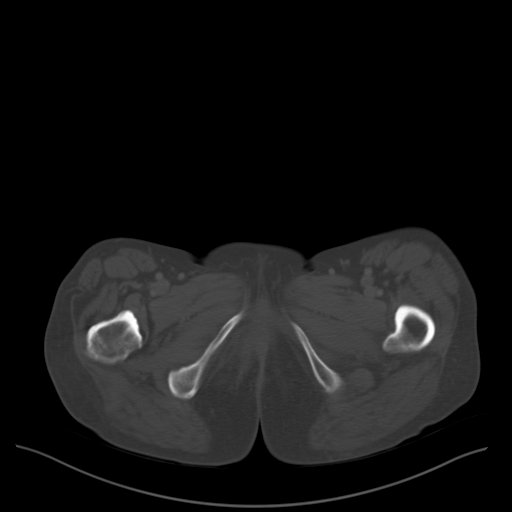
[im 13/83  soft-tissue]
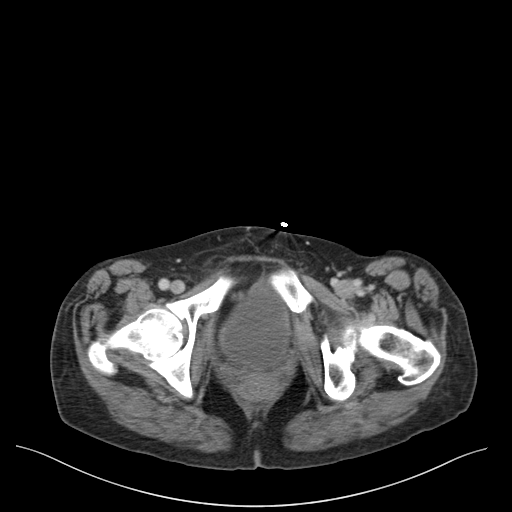
[im 18/83  soft-tissue]
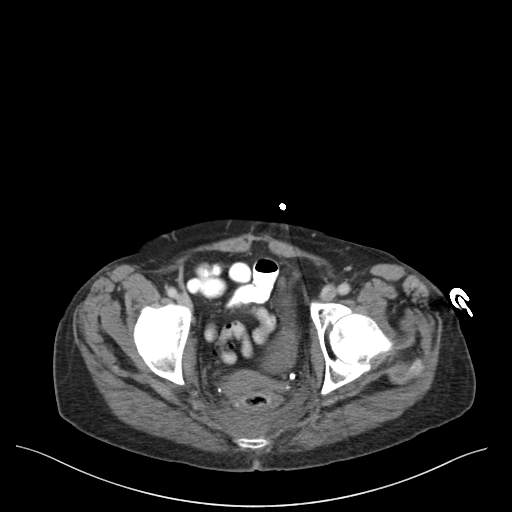
[im 26/83  soft-tissue]
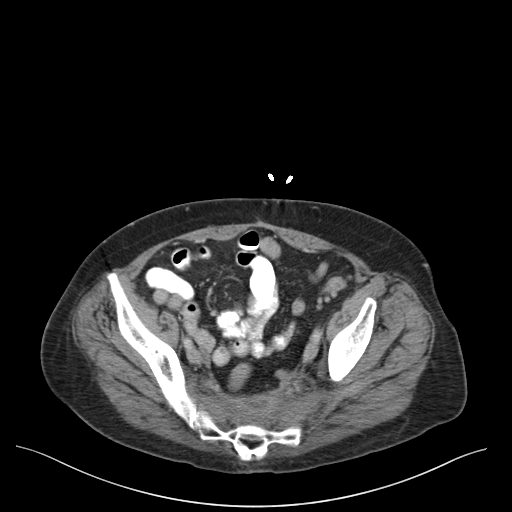
[im 31/83  soft-tissue]
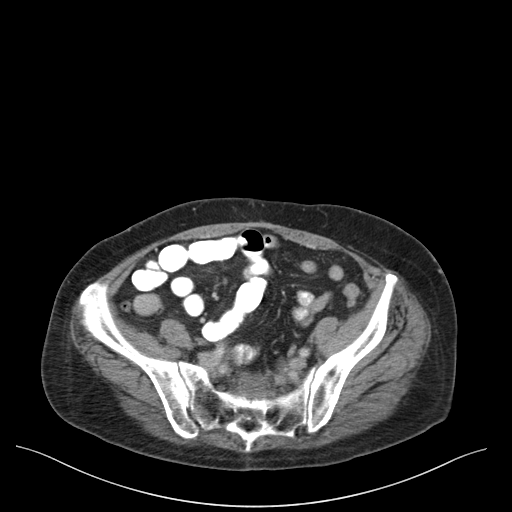
[im 39/83  soft-tissue]
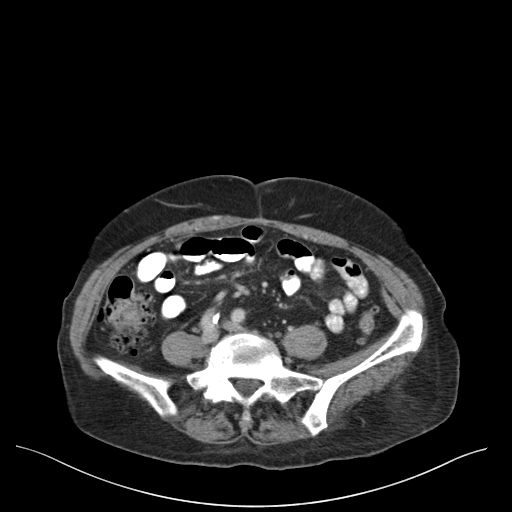
[im 44/83  soft-tissue]
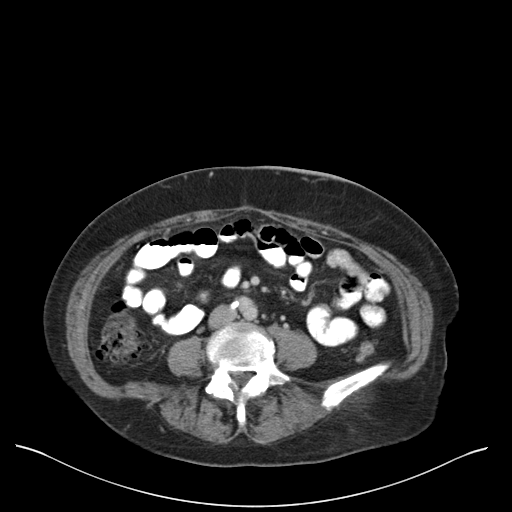
[im 52/83  soft-tissue]
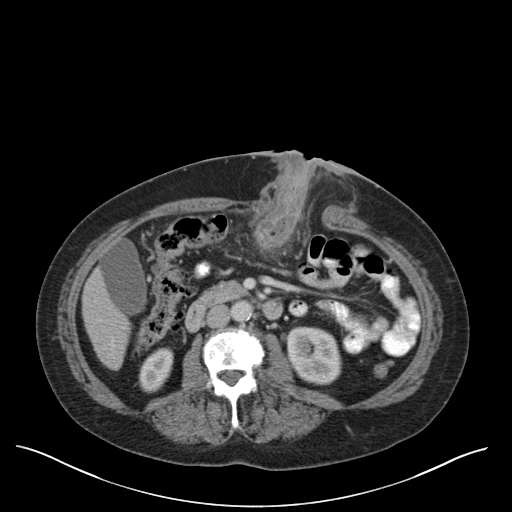
[im 57/83  soft-tissue]
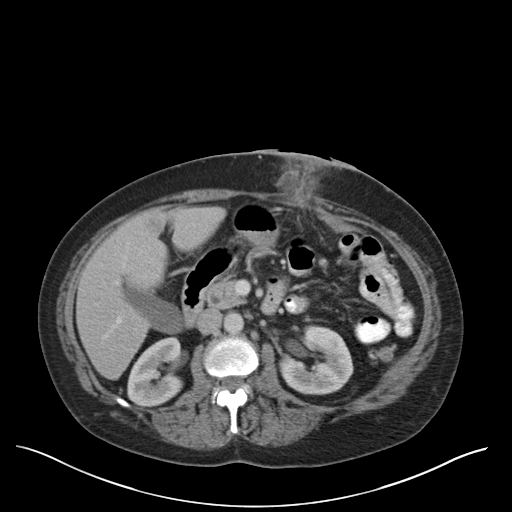
[im 57/83  bone]
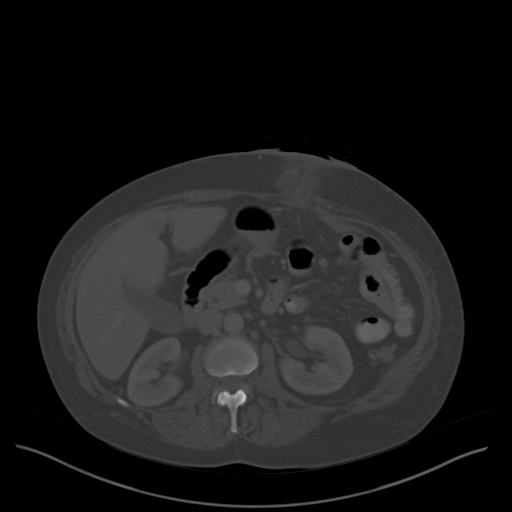
[im 65/83  soft-tissue]
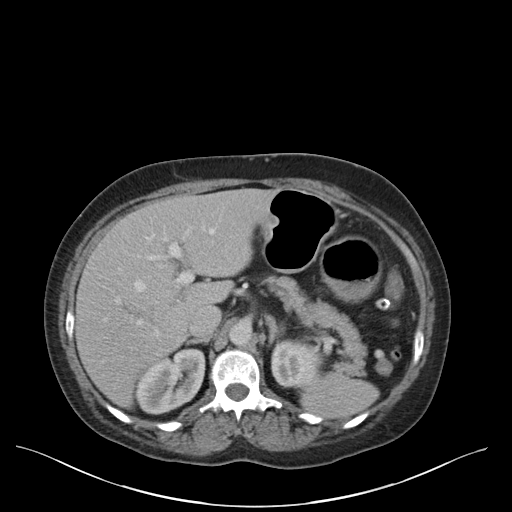
[im 65/83  lung]
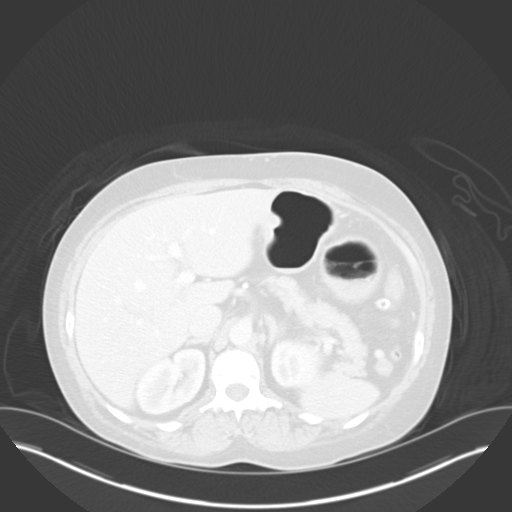
[im 70/83  soft-tissue]
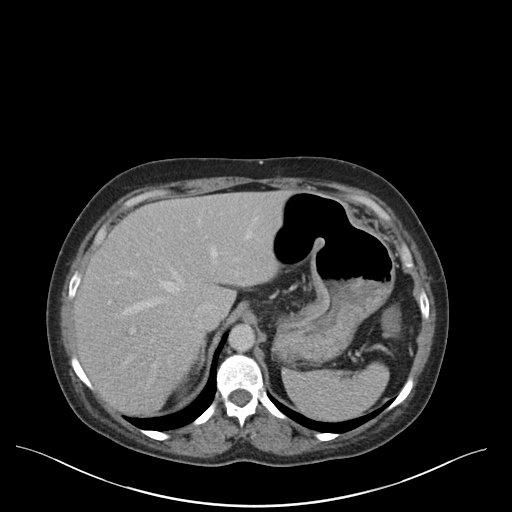
[im 70/83  lung]
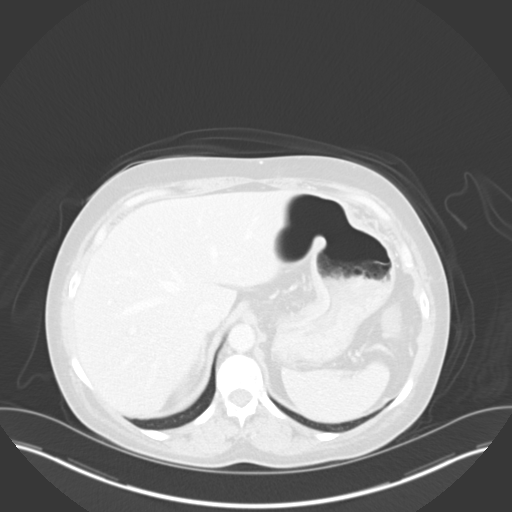
[im 74/83  lung]
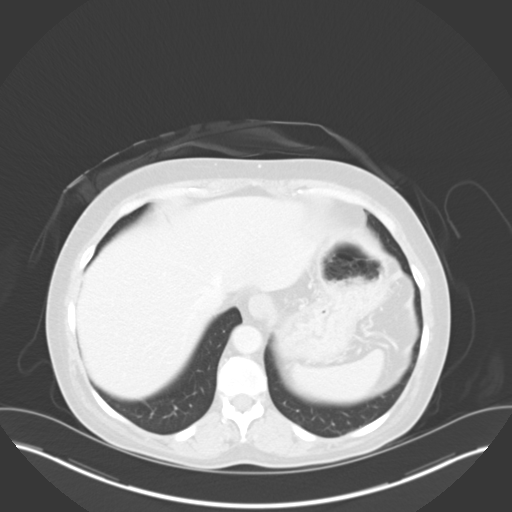
[im 78/83  soft-tissue]
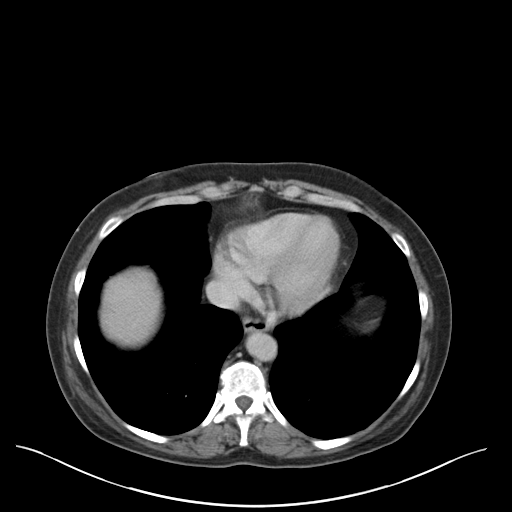
[im 78/83  lung]
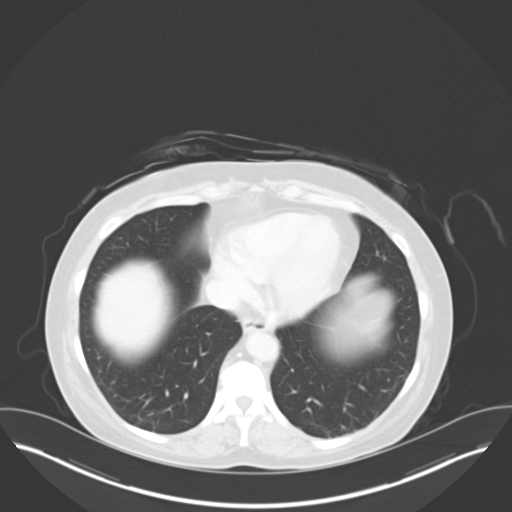

[13 of 32 positions shown; findings below may reference images not displayed]

FINDINGS: The tip of a central venous catheter is noted at the
cavoatrial junction.  Normal heart size.  Lung bases are clear.  No
pleural or pericardial effusion.

Focal hypoattenuation adjacent to the falciform ligament.

Unremarkable biliary system, spleen, pancreas, adrenal glands.

Symmetric renal enhancement.  No hydronephrosis or hydroureter.

No bowel obstruction.  Postoperative changes involving the rectum
with anastomotic suture noted.  There is thickening of the
presacral fat and a small loculated fluid collection that is
similar to prior.

Colonic diverticulosis.  Left lower quadrant diverting colostomy.
There is marked thickening of the segment of transverse colon
leading to the colostomy with associated pericolonic fat stranding.

No free intraperitoneal air or fluid.  No lymphadenopathy.

There is scattered atherosclerotic calcification of the aorta and
its branches. No aneurysmal dilatation.

Thin-walled bladder.  Unchanged appearance to the uterus and
adnexa.  Multilevel degenerative changes.  No acute osseous
finding.
IMPRESSION: Status post diverting colostomy.  There is marked thickening of the
segment of transverse colon leading to the colostomy, in keeping
with a nonspecific colitis (infectious, inflammatory, and ischemic
considerations).  Given the underlying diverticulosis,
diverticulitis is also a consideration.

Presacral soft tissue and fluid attenuation is without significant
interval change in this patient status post surgery and radiation.

Hypoattenuation within the liver adjacent to the falciform
ligament.  This is a common location for focal fat are very
perfusion.  However, given that I do not appreciate this appearance
on the prior, recommend close attention on follow-up.

## 2013-05-12 ENCOUNTER — Encounter (HOSPITAL_COMMUNITY)
Admission: RE | Disposition: A | Discharge status: Home Health | Payer: Self-pay | Source: Ambulatory Visit | Attending: General Surgery

## 2013-05-12 ENCOUNTER — Encounter (HOSPITAL_COMMUNITY): Payer: Medicare Other | Admitting: Registered Nurse

## 2013-05-12 ENCOUNTER — Inpatient Hospital Stay (HOSPITAL_COMMUNITY): Payer: Medicare Other | Admitting: Registered Nurse

## 2013-05-12 ENCOUNTER — Encounter (HOSPITAL_COMMUNITY): Payer: Self-pay | Admitting: *Deleted

## 2013-05-12 ENCOUNTER — Inpatient Hospital Stay (HOSPITAL_COMMUNITY)
Admission: RE | Admit: 2013-05-12 | Discharge: 2013-05-17 | DRG: 349 | Disposition: A | Discharge status: Home Health | Payer: Medicare Other | Source: Ambulatory Visit | Attending: General Surgery | Admitting: General Surgery

## 2013-05-12 DIAGNOSIS — R5082 Postprocedural fever: Secondary | ICD-10-CM | POA: Diagnosis not present

## 2013-05-12 DIAGNOSIS — Z85048 Personal history of other malignant neoplasm of rectum, rectosigmoid junction, and anus: Secondary | ICD-10-CM

## 2013-05-12 DIAGNOSIS — F3289 Other specified depressive episodes: Secondary | ICD-10-CM | POA: Diagnosis present

## 2013-05-12 DIAGNOSIS — C19 Malignant neoplasm of rectosigmoid junction: Secondary | ICD-10-CM | POA: Diagnosis not present

## 2013-05-12 DIAGNOSIS — F329 Major depressive disorder, single episode, unspecified: Secondary | ICD-10-CM | POA: Diagnosis not present

## 2013-05-12 DIAGNOSIS — Z860100 Personal history of colon polyps, unspecified: Secondary | ICD-10-CM

## 2013-05-12 DIAGNOSIS — I10 Essential (primary) hypertension: Secondary | ICD-10-CM | POA: Diagnosis present

## 2013-05-12 DIAGNOSIS — F17200 Nicotine dependence, unspecified, uncomplicated: Secondary | ICD-10-CM | POA: Diagnosis not present

## 2013-05-12 DIAGNOSIS — F1411 Cocaine abuse, in remission: Secondary | ICD-10-CM | POA: Diagnosis present

## 2013-05-12 DIAGNOSIS — K435 Parastomal hernia without obstruction or  gangrene: Secondary | ICD-10-CM | POA: Diagnosis present

## 2013-05-12 DIAGNOSIS — D649 Anemia, unspecified: Secondary | ICD-10-CM | POA: Diagnosis not present

## 2013-05-12 DIAGNOSIS — IMO0002 Reserved for concepts with insufficient information to code with codable children: Principal | ICD-10-CM | POA: Diagnosis present

## 2013-05-12 HISTORY — PX: LAPAROSCOPIC PARASTOMAL HERNIA: SHX6347

## 2013-05-12 LAB — TYPE AND SCREEN
ABO/RH(D): O POS
Antibody Screen: NEGATIVE

## 2013-05-12 SURGERY — REPAIR, HERNIA, PARASTOMAL, LAPAROSCOPIC
Anesthesia: General | Site: Abdomen

## 2013-05-12 MED ORDER — MORPHINE SULFATE (PF) 1 MG/ML IV SOLN
INTRAVENOUS | Status: DC
  Administered 2013-05-12: 7.5 mg via INTRAVENOUS
  Administered 2013-05-12: 1.5 mg via INTRAVENOUS
  Administered 2013-05-13: 5.74 mg via INTRAVENOUS
  Administered 2013-05-13: 09:00:00 via INTRAVENOUS
  Administered 2013-05-13: 13.09 mg via INTRAVENOUS
  Administered 2013-05-13: 4.5 mg via INTRAVENOUS
  Administered 2013-05-14: 1.5 mg via INTRAVENOUS
  Administered 2013-05-14: 3 mg via INTRAVENOUS
  Administered 2013-05-14: 10:00:00 via INTRAVENOUS
  Administered 2013-05-14: 102.3 mg via INTRAVENOUS
  Filled 2013-05-12 (×3): qty 25

## 2013-05-12 MED ORDER — ROCURONIUM BROMIDE 100 MG/10ML IV SOLN
INTRAVENOUS | Status: DC | PRN
  Administered 2013-05-12: 10 mg via INTRAVENOUS
  Administered 2013-05-12: 5 mg via INTRAVENOUS
  Administered 2013-05-12 (×2): 10 mg via INTRAVENOUS
  Administered 2013-05-12: 50 mg via INTRAVENOUS
  Administered 2013-05-12 (×2): 10 mg via INTRAVENOUS

## 2013-05-12 MED ORDER — LOPERAMIDE HCL 2 MG PO CAPS
2.0000 mg | ORAL_CAPSULE | Freq: Four times a day (QID) | ORAL | Status: DC
  Administered 2013-05-12 – 2013-05-15 (×13): 2 mg via ORAL
  Filled 2013-05-12 (×23): qty 1

## 2013-05-12 MED ORDER — HYDROMORPHONE HCL PF 1 MG/ML IJ SOLN
0.2500 mg | INTRAMUSCULAR | Status: DC | PRN
  Administered 2013-05-12: 0.5 mg via INTRAVENOUS

## 2013-05-12 MED ORDER — CITALOPRAM HYDROBROMIDE 20 MG PO TABS
20.0000 mg | ORAL_TABLET | Freq: Every morning | ORAL | Status: DC
  Administered 2013-05-13 – 2013-05-17 (×5): 20 mg via ORAL
  Filled 2013-05-12 (×5): qty 1

## 2013-05-12 MED ORDER — DIPHENHYDRAMINE HCL 50 MG/ML IJ SOLN: 12.5000 mg | Freq: Four times a day (QID) | INTRAMUSCULAR | Status: DC | PRN

## 2013-05-12 MED ORDER — LACTATED RINGERS IV SOLN
INTRAVENOUS | Status: DC
  Administered 2013-05-12: 1000 mL via INTRAVENOUS
  Administered 2013-05-12: 12:00:00 via INTRAVENOUS

## 2013-05-12 MED ORDER — PROPOFOL 10 MG/ML IV BOLUS
INTRAVENOUS | Status: AC
  Filled 2013-05-12: qty 20

## 2013-05-12 MED ORDER — LIDOCAINE HCL (CARDIAC) 20 MG/ML IV SOLN
INTRAVENOUS | Status: DC | PRN
  Administered 2013-05-12: 80 mg via INTRAVENOUS

## 2013-05-12 MED ORDER — FENTANYL CITRATE 0.05 MG/ML IJ SOLN: 25.0000 ug | INTRAMUSCULAR | Status: DC | PRN

## 2013-05-12 MED ORDER — GLYCOPYRROLATE 0.2 MG/ML IJ SOLN
INTRAMUSCULAR | Status: DC | PRN
  Administered 2013-05-12: .6 mg via INTRAVENOUS

## 2013-05-12 MED ORDER — MIDAZOLAM HCL 2 MG/2ML IJ SOLN
INTRAMUSCULAR | Status: AC
  Filled 2013-05-12: qty 2

## 2013-05-12 MED ORDER — KCL IN DEXTROSE-NACL 20-5-0.45 MEQ/L-%-% IV SOLN
INTRAVENOUS | Status: DC
  Administered 2013-05-12 – 2013-05-15 (×4): via INTRAVENOUS
  Filled 2013-05-12 (×7): qty 1000

## 2013-05-12 MED ORDER — DIPHENHYDRAMINE HCL 50 MG/ML IJ SOLN
INTRAMUSCULAR | Status: AC
  Administered 2013-05-12: 12.6 mg
  Filled 2013-05-12: qty 1

## 2013-05-12 MED ORDER — BUPROPION HCL 75 MG PO TABS
75.0000 mg | ORAL_TABLET | Freq: Two times a day (BID) | ORAL | Status: DC
  Administered 2013-05-12 – 2013-05-17 (×10): 75 mg via ORAL
  Filled 2013-05-12 (×11): qty 1

## 2013-05-12 MED ORDER — FENTANYL CITRATE 0.05 MG/ML IJ SOLN
INTRAMUSCULAR | Status: AC
  Filled 2013-05-12: qty 5

## 2013-05-12 MED ORDER — DIPHENHYDRAMINE HCL 50 MG/ML IJ SOLN
12.5000 mg | Freq: Four times a day (QID) | INTRAMUSCULAR | Status: DC | PRN
Start: 2013-05-12 — End: 2013-05-14

## 2013-05-12 MED ORDER — EPHEDRINE SULFATE 50 MG/ML IJ SOLN
INTRAMUSCULAR | Status: DC | PRN
  Administered 2013-05-12 (×5): 5 mg via INTRAVENOUS

## 2013-05-12 MED ORDER — SODIUM CHLORIDE 0.9 % IJ SOLN: 9.0000 mL | INTRAMUSCULAR | Status: DC | PRN

## 2013-05-12 MED ORDER — CLINDAMYCIN PHOSPHATE 900 MG/50ML IV SOLN
900.0000 mg | INTRAVENOUS | Status: AC
  Administered 2013-05-12: 900 mg via INTRAVENOUS
  Filled 2013-05-12: qty 50

## 2013-05-12 MED ORDER — MORPHINE SULFATE (PF) 1 MG/ML IV SOLN
INTRAVENOUS | Status: AC
  Filled 2013-05-12: qty 25

## 2013-05-12 MED ORDER — HYDROMORPHONE HCL PF 2 MG/ML IJ SOLN
INTRAMUSCULAR | Status: AC
  Filled 2013-05-12: qty 1

## 2013-05-12 MED ORDER — CLINDAMYCIN PHOSPHATE 900 MG/50ML IV SOLN
INTRAVENOUS | Status: AC
  Filled 2013-05-12: qty 50

## 2013-05-12 MED ORDER — PROMETHAZINE HCL 25 MG/ML IJ SOLN: 6.2500 mg | INTRAMUSCULAR | Status: DC | PRN

## 2013-05-12 MED ORDER — CLINDAMYCIN PHOSPHATE 900 MG/50ML IV SOLN
900.0000 mg | Freq: Three times a day (TID) | INTRAVENOUS | Status: AC
  Administered 2013-05-12: 900 mg via INTRAVENOUS
  Filled 2013-05-12: qty 50

## 2013-05-12 MED ORDER — ONDANSETRON HCL 4 MG/2ML IJ SOLN
INTRAMUSCULAR | Status: AC
  Filled 2013-05-12: qty 2

## 2013-05-12 MED ORDER — PROPOFOL 10 MG/ML IV BOLUS
INTRAVENOUS | Status: DC | PRN
  Administered 2013-05-12: 190 mg via INTRAVENOUS

## 2013-05-12 MED ORDER — LIDOCAINE HCL (CARDIAC) 20 MG/ML IV SOLN
INTRAVENOUS | Status: AC
  Filled 2013-05-12: qty 5

## 2013-05-12 MED ORDER — HYDROMORPHONE HCL PF 1 MG/ML IJ SOLN
INTRAMUSCULAR | Status: DC | PRN
  Administered 2013-05-12 (×5): .4 mg via INTRAVENOUS

## 2013-05-12 MED ORDER — ALVIMOPAN 12 MG PO CAPS
12.0000 mg | ORAL_CAPSULE | Freq: Once | ORAL | Status: AC
  Administered 2013-05-12: 12 mg via ORAL
  Filled 2013-05-12: qty 1

## 2013-05-12 MED ORDER — GLYCOPYRROLATE 0.2 MG/ML IJ SOLN
INTRAMUSCULAR | Status: AC
  Filled 2013-05-12: qty 3

## 2013-05-12 MED ORDER — DEXTROSE 5 % IV SOLN
340.0000 mg | INTRAVENOUS | Status: AC
  Administered 2013-05-12: 340 mg via INTRAVENOUS
  Filled 2013-05-12: qty 8.5

## 2013-05-12 MED ORDER — NALOXONE HCL 0.4 MG/ML IJ SOLN: 0.4000 mg | INTRAMUSCULAR | Status: DC | PRN

## 2013-05-12 MED ORDER — HYDROMORPHONE HCL PF 1 MG/ML IJ SOLN
INTRAMUSCULAR | Status: AC
  Filled 2013-05-12: qty 1

## 2013-05-12 MED ORDER — KETOROLAC TROMETHAMINE 15 MG/ML IJ SOLN
30.0000 mg | Freq: Four times a day (QID) | INTRAMUSCULAR | Status: AC
  Administered 2013-05-12 – 2013-05-15 (×12): 30 mg via INTRAVENOUS
  Filled 2013-05-12 (×16): qty 2

## 2013-05-12 MED ORDER — AMITRIPTYLINE HCL 25 MG PO TABS
12.5000 mg | ORAL_TABLET | Freq: Every day | ORAL | Status: DC
  Administered 2013-05-12 – 2013-05-16 (×5): 25 mg via ORAL
  Filled 2013-05-12 (×6): qty 1

## 2013-05-12 MED ORDER — HYDROCHLOROTHIAZIDE 25 MG PO TABS
25.0000 mg | ORAL_TABLET | Freq: Every morning | ORAL | Status: DC
  Administered 2013-05-13 – 2013-05-17 (×5): 25 mg via ORAL
  Filled 2013-05-12 (×5): qty 1

## 2013-05-12 MED ORDER — ENOXAPARIN SODIUM 40 MG/0.4ML ~~LOC~~ SOLN
40.0000 mg | SUBCUTANEOUS | Status: DC
  Administered 2013-05-13 – 2013-05-17 (×5): 40 mg via SUBCUTANEOUS
  Filled 2013-05-12 (×7): qty 0.4

## 2013-05-12 MED ORDER — ONDANSETRON HCL 4 MG/2ML IJ SOLN: 4.0000 mg | Freq: Four times a day (QID) | INTRAMUSCULAR | Status: DC | PRN

## 2013-05-12 MED ORDER — GABAPENTIN 400 MG PO CAPS
800.0000 mg | ORAL_CAPSULE | Freq: Three times a day (TID) | ORAL | Status: DC
  Administered 2013-05-12 – 2013-05-17 (×15): 800 mg via ORAL
  Filled 2013-05-12 (×17): qty 2

## 2013-05-12 MED ORDER — ONDANSETRON HCL 4 MG/2ML IJ SOLN
INTRAMUSCULAR | Status: DC | PRN
  Administered 2013-05-12: 4 mg via INTRAVENOUS

## 2013-05-12 MED ORDER — PNEUMOCOCCAL VAC POLYVALENT 25 MCG/0.5ML IJ INJ
0.5000 mL | INJECTION | INTRAMUSCULAR | Status: DC
  Filled 2013-05-12 (×2): qty 0.5

## 2013-05-12 MED ORDER — MIDAZOLAM HCL 5 MG/5ML IJ SOLN
INTRAMUSCULAR | Status: DC | PRN
  Administered 2013-05-12: 1 mg via INTRAVENOUS

## 2013-05-12 MED ORDER — ALPRAZOLAM ER 0.5 MG PO TB24: 0.5000 mg | ORAL_TABLET | Freq: Every evening | ORAL | Status: DC | PRN

## 2013-05-12 MED ORDER — ROCURONIUM BROMIDE 100 MG/10ML IV SOLN
INTRAVENOUS | Status: AC
  Filled 2013-05-12: qty 1

## 2013-05-12 MED ORDER — LACTATED RINGERS IV SOLN
INTRAVENOUS | Status: DC | PRN
  Administered 2013-05-12: 1000 mL

## 2013-05-12 MED ORDER — NEOSTIGMINE METHYLSULFATE 1 MG/ML IJ SOLN
INTRAMUSCULAR | Status: DC | PRN
  Administered 2013-05-12: 4 mg via INTRAVENOUS

## 2013-05-12 MED ORDER — 0.9 % SODIUM CHLORIDE (POUR BTL) OPTIME
TOPICAL | Status: DC | PRN
  Administered 2013-05-12: 1000 mL

## 2013-05-12 MED ORDER — FENTANYL CITRATE 0.05 MG/ML IJ SOLN
INTRAMUSCULAR | Status: DC | PRN
  Administered 2013-05-12 (×5): 50 ug via INTRAVENOUS

## 2013-05-12 MED ORDER — BUPIVACAINE-EPINEPHRINE 0.25% -1:200000 IJ SOLN
INTRAMUSCULAR | Status: DC | PRN
  Administered 2013-05-12: 13 mL

## 2013-05-12 MED ORDER — BUPIVACAINE-EPINEPHRINE PF 0.25-1:200000 % IJ SOLN
INTRAMUSCULAR | Status: AC
  Filled 2013-05-12: qty 30

## 2013-05-12 MED ORDER — SODIUM CHLORIDE 0.9 % IJ SOLN
INTRAMUSCULAR | Status: AC
  Filled 2013-05-12: qty 10

## 2013-05-12 MED ORDER — DIPHENHYDRAMINE HCL 12.5 MG/5ML PO ELIX: 12.5000 mg | ORAL_SOLUTION | Freq: Four times a day (QID) | ORAL | Status: DC | PRN

## 2013-05-12 SURGICAL SUPPLY — 54 items
BENZOIN TINCTURE PRP APPL 2/3 (GAUZE/BANDAGES/DRESSINGS) IMPLANT
BLADE HEX COATED 2.75 (ELECTRODE) ×2 IMPLANT
CABLE HIGH FREQUENCY MONO STRZ (ELECTRODE) ×2 IMPLANT
CANISTER SUCTION 2500CC (MISCELLANEOUS) ×2 IMPLANT
CHLORAPREP W/TINT 26ML (MISCELLANEOUS) ×2 IMPLANT
DECANTER SPIKE VIAL GLASS SM (MISCELLANEOUS) IMPLANT
DERMABOND ADVANCED (GAUZE/BANDAGES/DRESSINGS) ×1
DERMABOND ADVANCED .7 DNX12 (GAUZE/BANDAGES/DRESSINGS) ×1 IMPLANT
DEVICE SECURE STRAP 25 ABSORB (INSTRUMENTS) IMPLANT
DEVICE TROCAR PUNCTURE CLOSURE (ENDOMECHANICALS) ×2 IMPLANT
DRAPE INCISE IOBAN 66X45 STRL (DRAPES) ×2 IMPLANT
DRAPE LAPAROSCOPIC ABDOMINAL (DRAPES) ×2 IMPLANT
DRAPE UTILITY XL STRL (DRAPES) ×2 IMPLANT
DRSG TEGADERM 4X4.75 (GAUZE/BANDAGES/DRESSINGS) ×2 IMPLANT
ELECT REM PT RETURN 9FT ADLT (ELECTROSURGICAL) ×2
ELECTRODE REM PT RTRN 9FT ADLT (ELECTROSURGICAL) ×1 IMPLANT
GLOVE BIOGEL PI IND STRL 7.0 (GLOVE) ×2 IMPLANT
GLOVE BIOGEL PI INDICATOR 7.0 (GLOVE) ×2
GLOVE ECLIPSE 6.5 STRL STRAW (GLOVE) ×2 IMPLANT
GLOVE ECLIPSE 8.0 STRL XLNG CF (GLOVE) ×2 IMPLANT
GOWN STRL REUS W/TWL XL LVL3 (GOWN DISPOSABLE) ×4 IMPLANT
GRAFT FLEX HD 20X25 THICK (Tissue Mesh) ×2 IMPLANT
KIT BASIN OR (CUSTOM PROCEDURE TRAY) ×2 IMPLANT
MARKER SKIN DUAL TIP RULER LAB (MISCELLANEOUS) ×2 IMPLANT
NEEDLE INSUFFLATION 14GA 120MM (NEEDLE) IMPLANT
NEEDLE SPNL 22GX3.5 QUINCKE BK (NEEDLE) IMPLANT
NS IRRIG 1000ML POUR BTL (IV SOLUTION) ×2 IMPLANT
PAD ABD 8X10 STRL (GAUZE/BANDAGES/DRESSINGS) ×2 IMPLANT
SCALPEL HARMONIC ACE (MISCELLANEOUS) ×2 IMPLANT
SCISSORS LAP 5X35 DISP (ENDOMECHANICALS) IMPLANT
SET IRRIG TUBING LAPAROSCOPIC (IRRIGATION / IRRIGATOR) IMPLANT
SOLUTION ANTI FOG 6CC (MISCELLANEOUS) ×2 IMPLANT
SPONGE GAUZE 4X4 12PLY (GAUZE/BANDAGES/DRESSINGS) ×2 IMPLANT
SPONGE LAP 18X18 X RAY DECT (DISPOSABLE) ×2 IMPLANT
STAPLER PROXIMATE 75MM BLUE (STAPLE) ×2 IMPLANT
STAPLER VISISTAT 35W (STAPLE) ×2 IMPLANT
STRIP CLOSURE SKIN 1/2X4 (GAUZE/BANDAGES/DRESSINGS) IMPLANT
SUT MNCRL AB 4-0 PS2 18 (SUTURE) ×2 IMPLANT
SUT NOVA NAB DX-16 0-1 5-0 T12 (SUTURE) ×8 IMPLANT
SUT NOVA NAB GS-21 0 18 T12 DT (SUTURE) ×2 IMPLANT
SUT PROLENE 0 CT 1 CR/8 (SUTURE) ×2 IMPLANT
SUT SILK 2 0 SH CR/8 (SUTURE) ×2 IMPLANT
SUT VIC AB 2-0 SH 18 (SUTURE) ×6 IMPLANT
SUT VIC AB 4-0 PS2 27 (SUTURE) ×2 IMPLANT
TACKER 5MM HERNIA 3.5CML NAB (ENDOMECHANICALS) ×4 IMPLANT
TOWEL OR 17X26 10 PK STRL BLUE (TOWEL DISPOSABLE) ×2 IMPLANT
TRAY FOLEY CATH 14FRSI W/METER (CATHETERS) ×2 IMPLANT
TRAY LAP CHOLE (CUSTOM PROCEDURE TRAY) ×2 IMPLANT
TROCAR BLADELESS OPT 5 75 (ENDOMECHANICALS) ×2 IMPLANT
TROCAR SLEEVE XCEL 5X75 (ENDOMECHANICALS) ×6 IMPLANT
TROCAR XCEL BLUNT TIP 100MML (ENDOMECHANICALS) IMPLANT
TROCAR XCEL NON-BLD 11X100MML (ENDOMECHANICALS) ×2 IMPLANT
TUBING INSUFFLATION 10FT LAP (TUBING) ×2 IMPLANT
YANKAUER SUCT BULB TIP NO VENT (SUCTIONS) ×4 IMPLANT

## 2013-05-12 NOTE — H&P (Signed)
[name] is a 58 y.o. female who is here for regarding her parastomal hernia. She has been working on quitting smoking. She is down to 1-2 cigarettes a day. She has gained about 3 lbs over the last month.   Past Medical History  Diagnosis Date  . 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 Hypertension   . Diverticulosis   . Radiation proctitis     mild  . Adenomatous colon polyp   . Anemia, unspecified   . Depression   . Nonspecific colitis 10/10/11  . Cocaine abuse     as recently as 10/21/11  . Hernia 03/18/2013  . Neuromuscular disorder   . 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 Incontinence of bowel     since colostomy  . History of transfusion   . Difficulty sleeping     takes meds to sleep   Past Surgical History  Procedure Laterality Date  . Mouth surgery  10/2009  . Low anterior bowel resection  03/07/09  . Colostomy  2013    [facility]  . Portacath placement      and removal     Medication List           ALIGN 4 MG Caps  Take 1 capsule by mouth daily.     ALPRAZolam 0.5 MG 24 hr tablet  Commonly known as:  XANAX XR  Take 0.5 mg by mouth at bedtime as needed for anxiety or sleep.     amitriptyline 25 MG tablet  Commonly known as:  ELAVIL  Take 12.5-25 mg by mouth at bedtime.     buPROPion 75 MG tablet  Commonly known as:  WELLBUTRIN  Take 75 mg by mouth 2 (two) times daily.     citalopram 20 MG tablet  Commonly known as:  CELEXA  Take 20 mg by mouth every morning.     gabapentin 400 MG capsule  Commonly known as:  NEURONTIN  Take 800 mg by mouth 3 (three) times daily.     hydrochlorothiazide 25 MG tablet  Commonly known as:  HYDRODIURIL  Take 25 mg by mouth every morning.     loperamide 2 MG capsule  Commonly known as:  IMODIUM  Take 2 mg by mouth 4 (four) times daily.       Allergies  Allergen Reactions  . Ampicillin Hives  . Hydrocodone Itching  . Penicillins Other (See Comments)    Unknown reaction    Review of Systems - General ROS: negative for - chills or fever Hematological and Lymphatic ROS: negative for - bleeding problems or blood clots Respiratory ROS: no cough, shortness of breath, or wheezing Cardiovascular ROS: no chest pain or dyspnea on exertion Gastrointestinal ROS: no abdominal pain, change in bowel habits, or black or bloody stools Genito-Urinary ROS: no dysuria, trouble voiding, or hematuria    Objective:  Filed Vitals:   05/12/13 0827  BP: 148/89  Pulse: 70  Temp: 97.8 F (36.6 C)  Resp: 18   Gen: NAD CV: RRR Resp: CTA GI: soft, non-tender, large prolapsing loop colostomy with parastomal hernia    CT IMPRESSION:  1. No evidence of metastatic disease status post distal colon resection and transverse colostomy.  2. Large parastomal herniation of small bowel adjacent to the transverse colostomy, not significantly changed from the most recent study. No evidence of incarceration or bowel obstruction.  3. 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   Assessment  and Plan:  [name] Is a 58 year old female with a large peristomal hernia. She is here to discuss surgical options. She is in the last month working on quitting smoking. She is down to 1-2 cigarettes a day. On exam she has a large prolapsing loop colostomy with a peristomal hernia. CT scan shows no evidence of metastatic disease and a large peristomal hernia with small bowel and transverse colon. We discussed surgical options. Options include moving the ostomy to another site and inverting this into an end colostomy. We could also reduce her current hernia and convert to an end colostomy and reinforce her current ostomy with a mesh placement in a Sugarbaker type repair. She is open to any of these options. I told her we would definitely convert her loop colostomy into an end colostomy given the signs of recurrent rectal disease. I also offered her closure of her loop colostomy as well. She  declined this due to the problem she was having with incontinence. We discussed the risk of surgery which include pain, bleeding, damage to adjacent structures, bowel obstructions, recurrent hernias and infections of mesh. She understands that we will not have a final decision until we are in the operating room and can take a look at the inside of her abdominal wall. We will decide exactly where her ostomy will be placed once we evaluate everything completely internally. I will have her marked by the ostomy nurse preoperatively so that I may give her the best option possible. I think with quitting smoking and this hernia repair we should be able to minimize her chances of recurrence.

## 2013-05-12 NOTE — Anesthesia Preprocedure Evaluation (Addendum)
Anesthesia Evaluation  Patient identified by MRN, date of birth, ID band Patient awake  General Assessment Comment:.  Neuropathy         secondary to oxaliplatin   .  Hypertension     .  Diverticulosis     .  Radiation proctitis         mild   .  Adenomatous colon polyp     .  Anemia, unspecified     .  Depression     .  Nonspecific colitis  10/10/11   .  Cocaine abuse         as recently as 10/21/11   .  Hernia  03/18/2013   .  Neuromuscular disorder     .  Rectosigmoid cancer  10/2008 dx       LAR surg 02/2009, chemo thru 09/2009   .  Incontinence of bowel         since colostomy   .  History of transfusion     .  Difficulty sleeping         takes meds to sleep     Reviewed: Allergy & Precautions, H&P , NPO status , Patient's Chart, lab work & pertinent test results  History of Anesthesia Complications (+) MALIGNANT HYPERTHERMIA  Airway Mallampati: II TM Distance: >3 FB Neck ROM: Full    Dental  (+) Edentulous Upper and Edentulous Lower   Pulmonary neg pulmonary ROS, Current Smoker,  breath sounds clear to auscultation  Pulmonary exam normal       Cardiovascular Exercise Tolerance: Good hypertension, Pt. on medications Rhythm:Regular Rate:Normal  Denies cardiac symptoms.   Neuro/Psych PSYCHIATRIC DISORDERS Depression  Neuromuscular disease    GI/Hepatic negative GI ROS, (+)     substance abuse  cocaine use, She denies recent cocaine use. Last 2013   Endo/Other  negative endocrine ROS  Renal/GU negative Renal ROS  negative genitourinary   Musculoskeletal negative musculoskeletal ROS (+)   Abdominal   Peds negative pediatric ROS (+)  Hematology  (+) anemia ,   Anesthesia Other Findings   Reproductive/Obstetrics negative OB ROS                        Anesthesia Physical Anesthesia Plan  ASA: II  Anesthesia Plan: General   Post-op Pain Management:    Induction:  Intravenous  Airway Management Planned: Oral ETT  Additional Equipment:   Intra-op Plan:   Post-operative Plan: Extubation in OR  Informed Consent: I have reviewed the patients History and Physical, chart, labs and discussed the procedure including the risks, benefits and alternatives for the proposed anesthesia with the patient or authorized representative who has indicated his/her understanding and acceptance.   Dental advisory given  Plan Discussed with: CRNA  Anesthesia Plan Comments:         Anesthesia Quick Evaluation

## 2013-05-12 NOTE — Anesthesia Postprocedure Evaluation (Signed)
  Anesthesia Post-op Note  Patient: [name]  Procedure(s) Performed: Procedure(s) (LRB): LAPAROSCOPIC PARASTOMAL HERNIA (N/A)  Patient Location: PACU  Anesthesia Type: General  Level of Consciousness: awake and alert   Airway and Oxygen Therapy: Patient Spontanous Breathing  Post-op Pain: mild  Post-op Assessment: Post-op Vital signs reviewed, Patient's Cardiovascular Status Stable, Respiratory Function Stable, Patent Airway and No signs of Nausea or vomiting  Last Vitals:  Filed Vitals:   05/12/13 1650  BP: 159/88  Pulse: 71  Temp: 37.4 C  Resp: 16    Post-op Vital Signs: stable   Complications: No apparent anesthesia complications

## 2013-05-12 NOTE — Preoperative (Signed)
Beta Blockers   Reason not to administer Beta Blockers:Not applicable

## 2013-05-12 NOTE — Op Note (Signed)
05/12/2013  3:38 PM  PATIENT:  [name]  58 y.o. female  Patient Care Team: Rowe Clack, MD as PCP - General [place] Dragon, MD (Gastroenterology) Jamey Reas de Berton Lan, MD (Obstetrics and Gynecology) Jana Half, DPM (Podiatry) Heath Lark, MD as Consulting Physician (Hematology and Oncology)  PRE-OPERATIVE DIAGNOSIS:  parastomal hernia with prolapse   POST-OPERATIVE DIAGNOSIS:  parastomal hernia with prolapse   PROCEDURE: LAPAROSCOPIC PARASTOMAL HERNIA REPAIR WITH MESH (Sugarbaker technique)   SURGEON:  Surgeon(s): Leighton Ruff, MD Adin Hector, MD  ASSISTANT: Johney Maine   ANESTHESIA:   general  EBL:  Total I/O In: 3000 [I.V.:3000] Out: 24 [Urine:350; Blood:120]  DRAINS: none   SPECIMEN:  Source of Specimen:  distal colon and loop colostomy  DISPOSITION OF SPECIMEN:  PATHOLOGY  COUNTS:  YES  PLAN OF CARE: Admit to inpatient   PATIENT DISPOSITION:  PACU - hemodynamically stable.  INDICATION: This is a 58 year old female who is status post low anterior resection for rectal cancer. She developed incontinence afterwards and was sent to [facility] for evaluation. She received a loop colostomy in her left upper quadrant a couple years ago. Since then she has had trouble with ostomy prolapse and difficulty pouching. We reviewed the causes of hernias and discussed anti-smoking techniques. After complying with an anti-smoking program and reducing her cigarette use we decided to repair her hernia and fix her ostomy prolapse.   OR FINDINGS: Significantly prolapsed loop colostomy with parastomal hernia. Hernia measures 7 x 7 cm  DESCRIPTION: the patient was identified in the preoperative holding area and taken to the OR where they were laid Supine on the operating room table.  Gen. anesthesia was induced without difficulty. SCDs were also noted to be in place prior to the initiation of anesthesia.  A Foley catheter was inserted after sterile  conditions.  The patient was then prepped and draped in the usual sterile fashion.    A surgical timeout was performed indicating the correct patient, procedure, positioning and need for preoperative antibiotics.  I began by using an optical injury trocar to enter the right upper quadrant under direct visualization. The abdomen was insufflated to approximately 15 mm of mercury. I then placed 2 more trochars in the right lateral sidewall under direct visualization. A third trocar was placed in the left lower quadrant also under direct visualization after the omentum was taken down using the harmonic scalpel. We then dissected out the hernia sac from the abdominal wall using the harmonic scalpel. Both loops of the ostomy were visible. There were no other adhesions noted. I then took down the loop colostomy using Bovie electrocautery and blunt dissection. The hernia sac was removed as well. Once the loop colostomy was freed, I decided to transect a portion of the distal colostomy to assist with rectal drainage. I dissected to approximately the level of the splenic flexure and then transected this using a GIA blue load stapler. After this was completed the mesentery was divided using the harmonic scalpel. I then transected the proximal colon just proximal to the loop colostomy site using Bovie electrocautery. This was then sent to pathology as a specimen. After this was completed I made a new ostomy in the patient's left lower quadrant at the previously marked ostomy marking.  I dissected down to the level of the fascia using Bovie electric cautery. The fascia was then incised in a cruciate manner. The rectus muscle was then split. The peritoneum was incised using Bovie  electrocautery. I then brought the ostomy through the new incision and abdominal wall. I left the ostomy fascial defect at a width where I could get my index finger between the ostomy and the fascia but nothing else. I then secured the ostomy to the  fascia using a Novafil suture. I then matured the ostomy in standard Brooke fashion using 2-0 Vicryl sutures. We then changed our gown and gloves and returned to the hernia defect. A 20 x 25 cm Flex HD biologic mesh was brought onto the field after properly measuring the ostomy defect.  We decided to place a piece of mesh that would cover the previous ostomy site as well as the new ostomy site to create a Sugarbaker repair and prevent recurrence of peristomal hernia. 8 stay sutures were placed around the edges of the ostomy mesh using oh Novafil sutures. The mesh was then placed into the abdomen in the correct orientation. I then closed the fascial defect of the previous hernia using interrupted Novafil sutures. The abdomen was then insufflated using the remaining trochars. We used an Endoloop suture passer to pull the sutures through the fascia and tied them into place. Once this was completed the edges of the mesh were tacked using a permanent metal tacker. At the end of the procedure the mesh was oriented in a Sugarbaker type repair and flush with the peritoneum. The abdomen was then desufflated. The 5 mm port sites were closed using a 4-0 Vicryl suture. I used 2-0 interrupted Vicryl sutures to close the subcutaneous tissue of the previous ostomy incision. I used a pursestring 2-0 Vicryl suture to help assist with secondary closure of the ostomy wound. Dermabond was then used to close all skin incisions. An ostomy appliance was placed. The previous ostomy site was packed with a wet-to-dry dressing. The patient was then awakened from anesthesia and sent to the post anesthesia care unit stable condition. All counts were correct per operating room staff.

## 2013-05-12 NOTE — Transfer of Care (Signed)
Immediate Anesthesia Transfer of Care Note  Patient: [name]  Procedure(s) Performed: Procedure(s): LAPAROSCOPIC PARASTOMAL HERNIA (N/A)  Patient Location: PACU  Anesthesia Type:General  Level of Consciousness: sedated  Airway & Oxygen Therapy: Patient Spontanous Breathing and Patient connected to face mask oxygen  Post-op Assessment: Report given to PACU RN and Post -op Vital signs reviewed and stable  Post vital signs: Reviewed and stable  Complications: No apparent anesthesia complications

## 2013-05-13 ENCOUNTER — Encounter (HOSPITAL_COMMUNITY): Payer: Self-pay | Admitting: General Surgery

## 2013-05-13 LAB — CBC
HCT: 31.9 % — ABNORMAL LOW (ref 36.0–46.0)
Hemoglobin: 10.5 g/dL — ABNORMAL LOW (ref 12.0–15.0)
MCH: 29.6 pg (ref 26.0–34.0)
MCHC: 32.9 g/dL (ref 30.0–36.0)
MCV: 89.9 fL (ref 78.0–100.0)
Platelets: 270 10*3/uL (ref 150–400)
RBC: 3.55 MIL/uL — ABNORMAL LOW (ref 3.87–5.11)
RDW: 14.2 % (ref 11.5–15.5)
WBC: 6.2 10*3/uL (ref 4.0–10.5)

## 2013-05-13 LAB — BASIC METABOLIC PANEL
BUN: 12 mg/dL (ref 6–23)
CO2: 26 mEq/L (ref 19–32)
Calcium: 8.2 mg/dL — ABNORMAL LOW (ref 8.4–10.5)
Chloride: 98 mEq/L (ref 96–112)
Creatinine, Ser: 0.86 mg/dL (ref 0.50–1.10)
GFR calc Af Amer: 86 mL/min — ABNORMAL LOW (ref >90)
GFR calc non Af Amer: 74 mL/min — ABNORMAL LOW (ref >90)
Glucose, Bld: 124 mg/dL — ABNORMAL HIGH (ref 70–99)
Potassium: 3.6 mEq/L — ABNORMAL LOW (ref 3.7–5.3)
Sodium: 138 mEq/L (ref 137–147)

## 2013-05-13 MED ORDER — ACETAMINOPHEN 500 MG PO TABS
500.0000 mg | ORAL_TABLET | Freq: Four times a day (QID) | ORAL | Status: DC | PRN
  Administered 2013-05-13 – 2013-05-16 (×2): 500 mg via ORAL
  Filled 2013-05-13 (×2): qty 1

## 2013-05-13 NOTE — Progress Notes (Signed)
1 Day Post-Op Lap parastomal hernia repair Subjective: Pt doing well.  Pain controlled with PCA.  No nausea  Objective: Vital signs in last 24 hours: Temp:  [98.1 F (36.7 C)-100.5 F (38.1 C)] 100 F (37.8 C) (01/30 1319) Pulse Rate:  [67-90] 90 (01/30 1319) Resp:  [12-20] 20 (01/30 1319) BP: (93-160)/(63-88) 93/76 mmHg (01/30 1319) SpO2:  [92 %-100 %] 95 % (01/30 1319) Weight:  [185 lb (83.915 kg)] 185 lb (83.915 kg) (01/29 1809)   Intake/Output from previous day: 01/29 0701 - 01/30 0700 In: 4812.5 [P.O.:120; I.V.:4642.5; IV Piggyback:50] Out: 2035 [Urine:1125; Blood:120] Intake/Output this shift: Total I/O In: 640 [P.O.:240; I.V.:400] Out: 400 [Urine:400]   General appearance: alert and cooperative GI: normal findings: soft, non-distended  Incision: no significant erythema  Lab Results:   Recent Labs  05/13/13 0500  WBC 6.2  HGB 10.5*  HCT 31.9*  PLT 270   BMET  Recent Labs  05/13/13 0500  NA 138  K 3.6*  CL 98  CO2 26  GLUCOSE 124*  BUN 12  CREATININE 0.86  CALCIUM 8.2*   PT/INR No results found for this basename: LABPROT, INR,  in the last 72 hours ABG No results found for this basename: PHART, PCO2, PO2, HCO3,  in the last 72 hours  MEDS, Scheduled . amitriptyline  12.5-25 mg Oral QHS  . buPROPion  75 mg Oral BID  . citalopram  20 mg Oral q morning - 10a  . enoxaparin (LOVENOX) injection  40 mg Subcutaneous Q24H  . gabapentin  800 mg Oral TID  . hydrochlorothiazide  25 mg Oral q morning - 10a  . ketorolac  30 mg Intravenous Q6H  . loperamide  2 mg Oral QID  . morphine   Intravenous Q4H  . pneumococcal 23 valent vaccine  0.5 mL Intramuscular Tomorrow-1000    Studies/Results: No results found.  Assessment: s/p Procedure(s): LAPAROSCOPIC PARASTOMAL HERNIA Patient Active Problem List   Diagnosis Date Noted  . Parastomal hernia 05/12/2013  . Hernia 03/18/2013  . Need for prophylactic vaccination and inoculation against influenza  03/18/2013  . Colostomy malfunction 12/09/2012  . Peristomal hernia 04/19/2012  . Depression   . Seborrheic dermatitis 08/09/2010  . PERSONAL HX RECTAL CANCER 06/05/2010  . SMOKER 04/16/2010  . ALLERGIC RHINITIS 04/16/2010  . UNSPECIFIED ANEMIA 12/06/2009  . 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 HYPERTENSION 12/06/2009  . FATIGUE 12/06/2009  . DIARRHEA 12/06/2009     Plan: Cont foley and IVF's, d/c foley tom Ambulate Cont clears until better bowel function Cont PCA for pain control   LOS: 1 day     .Rosario Adie, [facility], [place]   05/13/2013 2:13 PM

## 2013-05-13 NOTE — Progress Notes (Signed)
2200

## 2013-05-13 NOTE — Consult Note (Signed)
WOC ostomy consult note Stoma type/location: LLQ COlostomy Stomal assessment/size: 1 and 3/4 inches round, slightly budded, moist, non-functioning.  Patient wept (with joy and relief) when she viewed it today). Peristomal assessment: intact, clear Treatment options for stomal/peristomal skin: none indicated.  Stoma is slightly more elevated at the proximal end than distal, so a 1/2 of a skin barrier ring (from 3-9 o'clock) may be needed. Output None at this time Ostomy pouching: 2pc. , 2 and 3/4 inch ostomy pouching system with edges trimmed to avoid the surgical incision. Education provided: Patient supported as she wept with joy at the size, shape and location of this new stoma.Supplies ordered for bedside.  Secure Start notified of patient's newly sized ostomies and supplies (including a closed end pouch for intimacy) is ordered. [place] nursing team will not follow routinely, but will remain available to this patient, the nursing and surgical team.  Please re-consult if needed. Thanks, Maudie Flakes, MSN, RN, [place], [place], [place] [phone])

## 2013-05-13 NOTE — Care Management Note (Addendum)
    Page 1 of 1   05/17/2013     10:30:50 AM   CARE MANAGEMENT NOTE 05/17/2013  Patient:  [name], [name]   Account Number:  [id number]  Date Initiated:  05/13/2013  Documentation initiated by:  Sunday Spillers  Subjective/Objective Assessment:   58 yo female admitted s/p lap parastomal hernia repair. PTA lived at home with spouse.     Action/Plan:   Home when stable   Anticipated DC Date:  05/18/2013   Anticipated DC Plan:  [place]  CM consult      [facility] Choice  HOME HEALTH   Choice offered to / List presented to:  C-1 Patient        [place] arranged  HH-1 RN  [place] PT      [place].   Status of service:  Completed, signed off Medicare Important Message given?  NA - LOS <3 / Initial given by admissions (If response is "NO", the following Medicare IM given date fields will be blank) Date Medicare IM given:   Date Additional Medicare IM given:    Discharge Disposition:  [place]  Per UR Regulation:  Reviewed for med. necessity/level of care/duration of stay  If discussed at [place] of Stay Meetings, dates discussed:    Comments:  05-17-13 Sunday Spillers RN CM 1000 Spoke with patient at bedside, states she has used [facility] in the past and would like to use them again for [facility] services. Contacted [facility] and they with follow up for [facility] needs.

## 2013-05-13 NOTE — Evaluation (Signed)
Physical Therapy Evaluation Patient Details Name: [name] MRN: [id number] DOB: 05-03-1955 Today's Date: 05/13/2013 Time: 0911-0926 PT Time Calculation (min): 15 min  PT Assessment / Plan / Recommendation History of Present Illness  58 yo female s/p lap parastomal hernia repair with mesh 1/29. Hx iof bil hand/feet neuropathy, HTN, cocaine abuse, NM d/o?[name]  Clinical Impression  On eval, pt required Mod assist of 2 for safe mobility-pt still somewhat drowsy from pain meds and is also limited by fatigue, pain. Appeared to tolerate session well. Will continue to follow. Recommend HHPT, int supervision. May need RW-will continue to assess gait, balance.     PT Assessment  Patient needs continued PT services    Follow Up Recommendations  Home health PT;Supervision - Intermittent    Does the patient have the potential to tolerate intense rehabilitation      Barriers to Discharge        Equipment Recommendations  Rolling walker with 5" wheels (may need-will continue to assess)    Recommendations for Other Services     Frequency Min 3X/week    Precautions / Restrictions Precautions Precautions: Fall Precaution Comments: abdomianl surgery, colostomy Restrictions Weight Bearing Restrictions: No   Pertinent Vitals/Pain 5/10 abdomen with activity. PCA encouraged      Mobility  Bed Mobility Overal bed mobility: Needs Assistance Bed Mobility: Supine to Sit;Sit to Supine Supine to sit: Mod assist;HOB elevated Sit to supine: Mod assist;HOB elevated General bed mobility comments: reliance on bedrail. Increased time. VCs for logroll technique however pt preferred to use rail to go from supine to sit with HOB elevated Transfers Overall transfer level: Needs assistance Transfers: Sit to/from Stand Sit to Stand: Mod assist;From elevated surface General transfer comment: Assist to rise, stabilize, control descent. VCS safety, technqiue, hand  placement Ambulation/Gait Ambulation/Gait assistance: Mod assist;+2 physical assistance;+2 safety/equipment Ambulation Distance (Feet): 60 Feet Assistive device: Rolling walker (2 wheeled) Gait Pattern/deviations: Decreased stride length;Decreased step length - left;Decreased step length - right General Gait Details: pt somewhat drowsy from meds. Intermittent cues for pt to keep eyes open. Assist to stabilize throughout ambulation and to maneuver with walker. Fatigues easily.     Exercises     PT Diagnosis: Difficulty walking;Generalized weakness;Acute pain  PT Problem List: Decreased strength;Decreased activity tolerance;Decreased balance;Decreased mobility;Pain;Decreased knowledge of precautions;Decreased knowledge of use of DME PT Treatment Interventions: DME instruction;Gait training;Functional mobility training;Therapeutic activities;Therapeutic exercise;Patient/family education;Balance training     PT Goals(Current goals can be found in the care plan section) Acute Rehab PT Goals Patient Stated Goal: home PT Goal Formulation: With patient Time For Goal Achievement: 05/27/13 Potential to Achieve Goals: Good  Visit Information  Last PT Received On: 05/13/13 Assistance Needed: +2 (safety) History of Present Illness: 58 yo female s/p lap parastomal hernia repair with mesh 1/29. Hx iof bil hand/feet neuropathy, HTN, cocaine abuse, NM d/o?[name]       Prior [place] expects to be discharged to:: Private residence Available Help at Discharge:  (pt states she will be home alone during day) Type of Home: House Home Access: Stairs to enter [employer] of Steps: 3 steps Home Layout: Two level;Able to live on main level with bedroom/bathroom Home Equipment: Kasandra Knudsen - single point Prior Function Level of Independence: Independent Communication Communication: No difficulties    Cognition  Cognition Arousal/Alertness: Awake/alert Behavior During  Therapy: WFL for tasks assessed/performed Overall Cognitive Status: Within Functional Limits for tasks assessed    Extremity/Trunk Assessment Upper Extremity Assessment Upper Extremity Assessment: Generalized  weakness Lower Extremity Assessment Lower Extremity Assessment: Generalized weakness Cervical / Trunk Assessment Cervical / Trunk Assessment: Normal   Balance    End of Session PT - End of Session Activity Tolerance: Patient limited by pain Patient left: in bed;with call bell/phone within reach  GP     Weston Anna, MPT Pager: [phone]

## 2013-05-13 NOTE — Progress Notes (Signed)
Dr. Hassell Done notified pt has temp of 103.0 oral; tylenol 500 mg given and pt encouraged to use Incentive Spirometer.  RN continuing to monitor.

## 2013-05-14 ENCOUNTER — Inpatient Hospital Stay (HOSPITAL_COMMUNITY): Payer: Medicare Other

## 2013-05-14 DIAGNOSIS — R509 Fever, unspecified: Secondary | ICD-10-CM | POA: Diagnosis not present

## 2013-05-14 LAB — URINALYSIS, ROUTINE W REFLEX MICROSCOPIC
Bilirubin Urine: NEGATIVE
Glucose, UA: NEGATIVE mg/dL
Ketones, ur: NEGATIVE mg/dL
Nitrite: NEGATIVE
Protein, ur: NEGATIVE mg/dL
Specific Gravity, Urine: 1.012 (ref 1.005–1.030)
Urobilinogen, UA: 0.2 mg/dL (ref 0.0–1.0)
pH: 5.5 (ref 5.0–8.0)

## 2013-05-14 LAB — CBC
HCT: 32 % — ABNORMAL LOW (ref 36.0–46.0)
Hemoglobin: 10.1 g/dL — ABNORMAL LOW (ref 12.0–15.0)
MCH: 29.2 pg (ref 26.0–34.0)
MCHC: 31.6 g/dL (ref 30.0–36.0)
MCV: 92.5 fL (ref 78.0–100.0)
Platelets: 246 10*3/uL (ref 150–400)
RBC: 3.46 MIL/uL — ABNORMAL LOW (ref 3.87–5.11)
RDW: 14.5 % (ref 11.5–15.5)
WBC: 8.9 10*3/uL (ref 4.0–10.5)

## 2013-05-14 LAB — BASIC METABOLIC PANEL
BUN: 9 mg/dL (ref 6–23)
CO2: 27 mEq/L (ref 19–32)
Calcium: 8.4 mg/dL (ref 8.4–10.5)
Chloride: 97 mEq/L (ref 96–112)
Creatinine, Ser: 0.87 mg/dL (ref 0.50–1.10)
GFR calc Af Amer: 85 mL/min — ABNORMAL LOW (ref >90)
GFR calc non Af Amer: 73 mL/min — ABNORMAL LOW (ref >90)
Glucose, Bld: 145 mg/dL — ABNORMAL HIGH (ref 70–99)
Potassium: 4 mEq/L (ref 3.7–5.3)
Sodium: 136 mEq/L — ABNORMAL LOW (ref 137–147)

## 2013-05-14 LAB — URINE MICROSCOPIC-ADD ON

## 2013-05-14 MED ORDER — DIPHENHYDRAMINE HCL 50 MG/ML IJ SOLN: 12.5000 mg | Freq: Four times a day (QID) | INTRAMUSCULAR | Status: DC | PRN

## 2013-05-14 MED ORDER — SODIUM CHLORIDE 0.9 % IJ SOLN: 9.0000 mL | INTRAMUSCULAR | Status: DC | PRN

## 2013-05-14 MED ORDER — ONDANSETRON HCL 4 MG/2ML IJ SOLN: 4.0000 mg | Freq: Four times a day (QID) | INTRAMUSCULAR | Status: DC | PRN

## 2013-05-14 MED ORDER — MORPHINE SULFATE (PF) 1 MG/ML IV SOLN
INTRAVENOUS | Status: DC
  Administered 2013-05-14: 10.87 mg via INTRAVENOUS
  Administered 2013-05-14: 8 mg via INTRAVENOUS
  Administered 2013-05-14: 3 mg via INTRAVENOUS
  Administered 2013-05-14: 16 mg via INTRAVENOUS
  Administered 2013-05-15: 17 mg via INTRAVENOUS
  Administered 2013-05-15: 9.97 mg via INTRAVENOUS
  Administered 2013-05-15: 9 mg via INTRAVENOUS
  Administered 2013-05-15: 11.97 mg via INTRAVENOUS
  Administered 2013-05-16: 4 mg via INTRAVENOUS
  Administered 2013-05-16: 1 mg via INTRAVENOUS
  Filled 2013-05-14 (×2): qty 25

## 2013-05-14 MED ORDER — NALOXONE HCL 0.4 MG/ML IJ SOLN: 0.4000 mg | INTRAMUSCULAR | Status: DC | PRN

## 2013-05-14 MED ORDER — DIPHENHYDRAMINE HCL 12.5 MG/5ML PO ELIX
12.5000 mg | ORAL_SOLUTION | Freq: Four times a day (QID) | ORAL | Status: DC | PRN
Start: 2013-05-14 — End: 2013-05-16

## 2013-05-14 NOTE — Progress Notes (Signed)
Patient ID: [name], female   DOB: 08/04/1955, 57 y.o.   MRN: [id number] [facility] Progress Note:   2 Days Post-Op  Subjective: Mental status is clear.  No complaints.  Wants more PO Objective: Vital signs in last 24 hours: Temp:  [98.3 F (36.8 C)-103 F (39.4 C)] 99.3 F (37.4 C) (01/31 0518) Pulse Rate:  [76-95] 81 (01/31 0518) Resp:  [14-25] 16 (01/31 0518) BP: (93-117)/(63-76) 116/72 mmHg (01/31 0518) SpO2:  [95 %-99 %] 98 % (01/31 0518)  Intake/Output from previous day: 01/30 0701 - 01/31 0700 In: 1730 [P.O.:480; I.V.:1250] Out: 1900 [Urine:1900] Intake/Output this shift: Total I/O In: 240 [P.O.:240] Out: -   Physical Exam: Work of breathing is not labored.  Ostomy bag filling.    Lab Results:  Results for orders placed during the hospital encounter of 05/12/13 (from the past 48 hour(s))  BASIC METABOLIC PANEL     Status: Abnormal   Collection Time    05/13/13  5:00 AM      Result Value Range   Sodium 138  137 - 147 mEq/L   Potassium 3.6 (*) 3.7 - 5.3 mEq/L   Chloride 98  96 - 112 mEq/L   CO2 26  19 - 32 mEq/L   Glucose, Bld 124 (*) 70 - 99 mg/dL   BUN 12  6 - 23 mg/dL   Creatinine, Ser 0.86  0.50 - 1.10 mg/dL   Calcium 8.2 (*) 8.4 - 10.5 mg/dL   GFR calc non Af Amer 74 (*) >90 mL/min   GFR calc Af Amer 86 (*) >90 mL/min   Comment: (NOTE)     The eGFR has been calculated using the CKD EPI equation.     This calculation has not been validated in all clinical situations.     eGFR's persistently <90 mL/min signify possible Chronic Kidney     Disease.  CBC     Status: Abnormal   Collection Time    05/13/13  5:00 AM      Result Value Range   WBC 6.2  4.0 - 10.5 K/uL   RBC 3.55 (*) 3.87 - 5.11 MIL/uL   Hemoglobin 10.5 (*) 12.0 - 15.0 g/dL   HCT 31.9 (*) 36.0 - 46.0 %   MCV 89.9  78.0 - 100.0 fL   MCH 29.6  26.0 - 34.0 pg   MCHC 32.9  30.0 - 36.0 g/dL   RDW 14.2  11.5 - 15.5 %   Platelets 270  150 - 400 K/uL  BASIC METABOLIC PANEL      Status: Abnormal   Collection Time    05/14/13  4:44 AM      Result Value Range   Sodium 136 (*) 137 - 147 mEq/L   Potassium 4.0  3.7 - 5.3 mEq/L   Chloride 97  96 - 112 mEq/L   CO2 27  19 - 32 mEq/L   Glucose, Bld 145 (*) 70 - 99 mg/dL   BUN 9  6 - 23 mg/dL   Creatinine, Ser 0.87  0.50 - 1.10 mg/dL   Calcium 8.4  8.4 - 10.5 mg/dL   GFR calc non Af Amer 73 (*) >90 mL/min   GFR calc Af Amer 85 (*) >90 mL/min   Comment: (NOTE)     The eGFR has been calculated using the CKD EPI equation.     This calculation has not been validated in all clinical situations.     eGFR's persistently <90 mL/min signify possible Chronic   Kidney     Disease.  CBC     Status: Abnormal   Collection Time    05/14/13  4:44 AM      Result Value Range   WBC 8.9  4.0 - 10.5 K/uL   RBC 3.46 (*) 3.87 - 5.11 MIL/uL   Hemoglobin 10.1 (*) 12.0 - 15.0 g/dL   HCT 32.0 (*) 36.0 - 46.0 %   MCV 92.5  78.0 - 100.0 fL   MCH 29.2  26.0 - 34.0 pg   MCHC 31.6  30.0 - 36.0 g/dL   RDW 14.5  11.5 - 15.5 %   Platelets 246  150 - 400 K/uL    Radiology/Results: No results found.  Anti-infectives: Anti-infectives   Start     Dose/Rate Route Frequency Ordered Stop   05/12/13 1900  clindamycin (CLEOCIN) IVPB 900 mg     900 mg 100 mL/hr over 30 Minutes Intravenous 3 times per day 05/12/13 1652 05/12/13 1851   05/12/13 0839  clindamycin (CLEOCIN) IVPB 900 mg     900 mg 100 mL/hr over 30 Minutes Intravenous 60 min pre-op 05/12/13 0839 05/12/13 1109   05/12/13 0839  gentamicin (GARAMYCIN) 340 mg in dextrose 5 % 100 mL IVPB     340 mg 108.5 mL/hr over 60 Minutes Intravenous 60 min pre-op 05/12/13 1657 05/12/13 1121      Assessment/Plan: Problem List: Patient Active Problem List   Diagnosis Date Noted  . Parastomal hernia 05/12/2013  . Hernia 03/18/2013  . Need for prophylactic vaccination and inoculation against influenza 03/18/2013  . Colostomy malfunction 12/09/2012  . Peristomal hernia 04/19/2012  . Depression    . Seborrheic dermatitis 08/09/2010  . PERSONAL HX RECTAL CANCER 06/05/2010  . SMOKER 04/16/2010  . ALLERGIC RHINITIS 04/16/2010  . UNSPECIFIED ANEMIA 12/06/2009  . 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 HYPERTENSION 12/06/2009  . FATIGUE 12/06/2009  . DIARRHEA 12/06/2009    Diet advance to full  2 Days Post-Op    LOS: 2 days   Matt B. Hassell Done, MD, [facility] [phone] be[place] [phone]  05/14/2013 8:15 AM

## 2013-05-15 LAB — BASIC METABOLIC PANEL
BUN: 7 mg/dL (ref 6–23)
CO2: 25 mEq/L (ref 19–32)
Calcium: 8.5 mg/dL (ref 8.4–10.5)
Chloride: 96 mEq/L (ref 96–112)
Creatinine, Ser: 0.84 mg/dL (ref 0.50–1.10)
GFR calc Af Amer: 88 mL/min — ABNORMAL LOW (ref >90)
GFR calc non Af Amer: 76 mL/min — ABNORMAL LOW (ref >90)
Glucose, Bld: 122 mg/dL — ABNORMAL HIGH (ref 70–99)
Potassium: 3.6 mEq/L — ABNORMAL LOW (ref 3.7–5.3)
Sodium: 134 mEq/L — ABNORMAL LOW (ref 137–147)

## 2013-05-15 LAB — URINE CULTURE: Colony Count: >=100000

## 2013-05-15 LAB — CBC
HCT: 28.7 % — ABNORMAL LOW (ref 36.0–46.0)
Hemoglobin: 9.5 g/dL — ABNORMAL LOW (ref 12.0–15.0)
MCH: 29.9 pg (ref 26.0–34.0)
MCHC: 33.1 g/dL (ref 30.0–36.0)
MCV: 90.3 fL (ref 78.0–100.0)
Platelets: 243 10*3/uL (ref 150–400)
RBC: 3.18 MIL/uL — ABNORMAL LOW (ref 3.87–5.11)
RDW: 14 % (ref 11.5–15.5)
WBC: 8.1 10*3/uL (ref 4.0–10.5)

## 2013-05-15 MED ORDER — STERILE WATER FOR INJECTION IJ SOLN
INTRAMUSCULAR | Status: AC
  Administered 2013-05-15: 10 mL
  Filled 2013-05-15: qty 10

## 2013-05-15 NOTE — Progress Notes (Signed)
Patient ID: [name], female   DOB: Jul 03, 1955, 58 y.o.   MRN: [id number] [facility] Surgery Progress Note:   3 Days Post-Op  Subjective: Mental status is clear.  Eating full liquid tray Objective: Vital signs in last 24 hours: Temp:  [98.5 F (36.9 C)-102.3 F (39.1 C)] 98.5 F (36.9 C) (02/01 0555) Pulse Rate:  [74-87] 74 (02/01 0555) Resp:  [16-18] 18 (02/01 0555) BP: (97-113)/(59-68) 97/61 mmHg (02/01 0555) SpO2:  [95 %-98 %] 98 % (02/01 0555)  Intake/Output from previous day: 01/31 0701 - 02/01 0700 In: 1440 [P.O.:840; I.V.:600] Out: 2025 [Urine:2000; Stool:25] Intake/Output this shift:    Physical Exam: Work of breathing is normal.  Ostomy functioning since last ngiht  Lab Results:  Results for orders placed during the hospital encounter of 05/12/13 (from the past 48 hour(s))  BASIC METABOLIC PANEL     Status: Abnormal   Collection Time    05/14/13  4:44 AM      Result Value Range   Sodium 136 (*) 137 - 147 mEq/L   Potassium 4.0  3.7 - 5.3 mEq/L   Chloride 97  96 - 112 mEq/L   CO2 27  19 - 32 mEq/L   Glucose, Bld 145 (*) 70 - 99 mg/dL   BUN 9  6 - 23 mg/dL   Creatinine, Ser 0.87  0.50 - 1.10 mg/dL   Calcium 8.4  8.4 - 10.5 mg/dL   GFR calc non Af Amer 73 (*) >90 mL/min   GFR calc Af Amer 85 (*) >90 mL/min   Comment: (NOTE)     The eGFR has been calculated using the CKD EPI equation.     This calculation has not been validated in all clinical situations.     eGFR's persistently <90 mL/min signify possible Chronic Kidney     Disease.  CBC     Status: Abnormal   Collection Time    05/14/13  4:44 AM      Result Value Range   WBC 8.9  4.0 - 10.5 K/uL   RBC 3.46 (*) 3.87 - 5.11 MIL/uL   Hemoglobin 10.1 (*) 12.0 - 15.0 g/dL   HCT 32.0 (*) 36.0 - 46.0 %   MCV 92.5  78.0 - 100.0 fL   MCH 29.2  26.0 - 34.0 pg   MCHC 31.6  30.0 - 36.0 g/dL   RDW 14.5  11.5 - 15.5 %   Platelets 246  150 - 400 K/uL  URINALYSIS, ROUTINE W REFLEX MICROSCOPIC     Status:  Abnormal   Collection Time    05/14/13  4:47 PM      Result Value Range   Color, Urine YELLOW  YELLOW   APPearance CLOUDY (*) CLEAR   Specific Gravity, Urine 1.012  1.005 - 1.030   pH 5.5  5.0 - 8.0   Glucose, UA NEGATIVE  NEGATIVE mg/dL   Hgb urine dipstick MODERATE (*) NEGATIVE   Bilirubin Urine NEGATIVE  NEGATIVE   Ketones, ur NEGATIVE  NEGATIVE mg/dL   Protein, ur NEGATIVE  NEGATIVE mg/dL   Urobilinogen, UA 0.2  0.0 - 1.0 mg/dL   Nitrite NEGATIVE  NEGATIVE   Leukocytes, UA SMALL (*) NEGATIVE  URINE MICROSCOPIC-ADD ON     Status: Abnormal   Collection Time    05/14/13  4:47 PM      Result Value Range   Squamous Epithelial / LPF FEW (*) RARE   WBC, UA 7-10  <3 WBC/hpf   Bacteria, UA FEW (*)  RARE  BASIC METABOLIC PANEL     Status: Abnormal   Collection Time    05/15/13  5:38 AM      Result Value Range   Sodium 134 (*) 137 - 147 mEq/L   Potassium 3.6 (*) 3.7 - 5.3 mEq/L   Chloride 96  96 - 112 mEq/L   CO2 25  19 - 32 mEq/L   Glucose, Bld 122 (*) 70 - 99 mg/dL   BUN 7  6 - 23 mg/dL   Creatinine, Ser 0.84  0.50 - 1.10 mg/dL   Calcium 8.5  8.4 - 10.5 mg/dL   GFR calc non Af Amer 76 (*) >90 mL/min   GFR calc Af Amer 88 (*) >90 mL/min   Comment: (NOTE)     The eGFR has been calculated using the CKD EPI equation.     This calculation has not been validated in all clinical situations.     eGFR's persistently <90 mL/min signify possible Chronic Kidney     Disease.  CBC     Status: Abnormal   Collection Time    05/15/13  5:38 AM      Result Value Range   WBC 8.1  4.0 - 10.5 K/uL   RBC 3.18 (*) 3.87 - 5.11 MIL/uL   Hemoglobin 9.5 (*) 12.0 - 15.0 g/dL   HCT 28.7 (*) 36.0 - 46.0 %   MCV 90.3  78.0 - 100.0 fL   MCH 29.9  26.0 - 34.0 pg   MCHC 33.1  30.0 - 36.0 g/dL   RDW 14.0  11.5 - 15.5 %   Platelets 243  150 - 400 K/uL    Radiology/Results: Dg Chest Port 1 View  05/14/2013   CLINICAL DATA:  Fever.  Hypertension  EXAM: PORTABLE CHEST - 1 VIEW  COMPARISON:  05/05/2013   FINDINGS: Mild reticular opacity extends from the inferior left hilum, likely subsegmental atelectasis. There is no convincing infiltrate. The lungs are otherwise clear.  There is a small, curved, wire like density that is superimposed over the medial right lung base. This was not present previously. It may reside external to this patient  Lungs are otherwise clear. No pleural effusion or pneumothorax. Normal heart, mediastinum and hila.  IMPRESSION: No acute cardiopulmonary disease.   Electronically Signed   By: Lajean Manes M.D.   On: 05/14/2013 14:23    Anti-infectives: Anti-infectives   Start     Dose/Rate Route Frequency Ordered Stop   05/12/13 1900  clindamycin (CLEOCIN) IVPB 900 mg     900 mg 100 mL/hr over 30 Minutes Intravenous 3 times per day 05/12/13 1652 05/12/13 1851   05/12/13 0839  clindamycin (CLEOCIN) IVPB 900 mg     900 mg 100 mL/hr over 30 Minutes Intravenous 60 min pre-op 05/12/13 0839 05/12/13 1109   05/12/13 0839  gentamicin (GARAMYCIN) 340 mg in dextrose 5 % 100 mL IVPB     340 mg 108.5 mL/hr over 60 Minutes Intravenous 60 min pre-op 05/12/13 7026 05/12/13 1121      Assessment/Plan: Problem List: Patient Active Problem List   Diagnosis Date Noted  . Parastomal hernia 05/12/2013  . Hernia 03/18/2013  . Need for prophylactic vaccination and inoculation against influenza 03/18/2013  . Colostomy malfunction 12/09/2012  . Peristomal hernia 04/19/2012  . Depression   . Seborrheic dermatitis 08/09/2010  . PERSONAL HX RECTAL CANCER 06/05/2010  . SMOKER 04/16/2010  . ALLERGIC RHINITIS 04/16/2010  . UNSPECIFIED ANEMIA 12/06/2009  . 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  HYPERTENSION 12/06/2009  . FATIGUE 12/06/2009  . DIARRHEA 12/06/2009    Progressing well.  Hopeful discharge tomorrow 3 Days Post-Op    LOS: 3 days   Matt B. Hassell Done, MD, [facility] [phone] beeper [phone]  05/15/2013 7:46 AM

## 2013-05-15 NOTE — Progress Notes (Signed)
According to night nurse pt was restless and a little confused last night even though we reduced her dose of Morphine PCA. Pt states she is not in very much pain.

## 2013-05-16 MED ORDER — ACETAMINOPHEN 500 MG PO TABS
500.0000 mg | ORAL_TABLET | Freq: Four times a day (QID) | ORAL | Status: DC
  Administered 2013-05-16 – 2013-05-17 (×4): 500 mg via ORAL
  Filled 2013-05-16 (×8): qty 1

## 2013-05-16 MED ORDER — MORPHINE SULFATE 2 MG/ML IJ SOLN: 2.0000 mg | INTRAMUSCULAR | Status: DC | PRN

## 2013-05-16 MED ORDER — OXYCODONE HCL 5 MG PO TABS
5.0000 mg | ORAL_TABLET | ORAL | Status: DC | PRN
  Administered 2013-05-16 – 2013-05-17 (×3): 10 mg via ORAL
  Filled 2013-05-16 (×3): qty 2

## 2013-05-16 NOTE — Progress Notes (Signed)
Physical Therapy Treatment Patient Details Name: [name] MRN: [id number] DOB: 07-Aug-1955 Today's Date: 05/16/2013 Time: 6440-3474 PT Time Calculation (min): 38 min  PT Assessment / Plan / Recommendation  History of Present Illness 58 yo female s/p lap parastomal hernia repair with mesh 1/29. Hx iof bil hand/feet neuropathy, HTN, cocaine abuse, NM d/o?[name]   PT Comments   Pt tolerated ambulation on hall x 400+ ' Pt requires RW for stability and safety.   Follow Up Recommendations  Home health PT;Supervision - Intermittent     Does the patient have the potential to tolerate intense rehabilitation     Barriers to Discharge        Equipment Recommendations  Rolling walker with 5" wheels    Recommendations for Other Services    Frequency Min 3X/week   Progress towards PT Goals Progress towards PT goals: Progressing toward goals  Plan Current plan remains appropriate    Precautions / Restrictions Precautions Precautions: Fall Precaution Comments: abdomianl surgery, colostomy   Pertinent Vitals/Pain Abdomen 5-6-intermittent.    Mobility  Transfers Overall transfer level: Needs assistance Equipment used: Rolling walker (2 wheeled) Transfers: Sit to/from Stand Sit to Stand: Supervision General transfer comment: no assistance today needed for rising. cues for safety. Ambulation/Gait Ambulation/Gait assistance: Min guard Ambulation Distance (Feet): 400 Feet Assistive device: Rolling walker (2 wheeled) Gait Pattern/deviations: Step-through pattern;Decreased stride length Gait velocity: slow General Gait Details:  pt takes extra time while ambulating, very slow pace.    Exercises     PT Diagnosis:    PT Problem List:   PT Treatment Interventions:     PT Goals (current goals can now be found in the care plan section)    Visit Information  Last PT Received On: 05/16/13 Assistance Needed: +1 History of Present Illness: 58 yo female s/p lap parastomal hernia repair with  mesh 1/29. Hx iof bil hand/feet neuropathy, HTN, cocaine abuse, NM d/o?[name]    Subjective Data      Cognition  Cognition Arousal/Alertness: Awake/alert Behavior During Therapy: WFL for tasks assessed/performed Overall Cognitive Status: Within Functional Limits for tasks assessed    Balance  Balance Overall balance assessment: Needs assistance Sitting-balance support: No upper extremity supported Sitting balance-Leahy Scale: Normal Sitting balance - Comments: able to dress lower body at edge of bed. Standing balance support: Single extremity supported Standing balance-Leahy Scale: Fair Standing balance comment: leans on counter to wash out colostomy bag in bathroom  End of Session PT - End of Session Activity Tolerance: Patient tolerated treatment well Patient left: in bed;with call bell/phone within reach Nurse Communication: Mobility status   GP     Claretha Cooper 05/16/2013, 5:55 PM Tresa Endo PT [phone]

## 2013-05-16 NOTE — Progress Notes (Signed)
4 Days Post-Op Lap parastomal hernia repair Subjective: Pain controlled with PCA.  No nausea but no appetite.  Having some fevers and fell overnight.  Oriented today.  No cough or SOB.  No calf pain.  No pain with urination.   Objective: Vital signs in last 24 hours: Temp:  [98.7 F (37.1 C)-102.3 F (39.1 C)] 102.1 F (38.9 C) (02/02 0601) Pulse Rate:  [76-89] 82 (02/02 0601) Resp:  [16-20] 18 (02/02 0800) BP: (94-130)/(56-77) 120/73 mmHg (02/02 0601) SpO2:  [91 %-98 %] 98 % (02/02 0800)   Intake/Output from previous day: 02/01 0701 - 02/02 0700 In: 2663.3 [P.O.:840; I.V.:1823.3] Out: 3070 [Urine:2800; Stool:270] Intake/Output this shift:   General appearance: alert and cooperative GI: normal findings: soft, non-distended Lungs: CTA Incision: no significant erythema  Lab Results:   Recent Labs  05/14/13 0444 05/15/13 0538  WBC 8.9 8.1  HGB 10.1* 9.5*  HCT 32.0* 28.7*  PLT 246 243   BMET  Recent Labs  05/14/13 0444 05/15/13 0538  NA 136* 134*  K 4.0 3.6*  CL 97 96  CO2 27 25  GLUCOSE 145* 122*  BUN 9 7  CREATININE 0.87 0.84  CALCIUM 8.4 8.5   PT/INR No results found for this basename: LABPROT, INR,  in the last 72 hours ABG No results found for this basename: PHART, PCO2, PO2, HCO3,  in the last 72 hours  MEDS, Scheduled . acetaminophen  500 mg Oral Q6H  . amitriptyline  12.5-25 mg Oral QHS  . buPROPion  75 mg Oral BID  . citalopram  20 mg Oral q morning - 10a  . enoxaparin (LOVENOX) injection  40 mg Subcutaneous Q24H  . gabapentin  800 mg Oral TID  . hydrochlorothiazide  25 mg Oral q morning - 10a  . pneumococcal 23 valent vaccine  0.5 mL Intramuscular Tomorrow-1000    Studies/Results: Dg Chest Port 1 View  05/14/2013   CLINICAL DATA:  Fever.  Hypertension  EXAM: PORTABLE CHEST - 1 VIEW  COMPARISON:  05/05/2013  FINDINGS: Mild reticular opacity extends from the inferior left hilum, likely subsegmental atelectasis. There is no convincing  infiltrate. The lungs are otherwise clear.  There is a small, curved, wire like density that is superimposed over the medial right lung base. This was not present previously. It may reside external to this patient  Lungs are otherwise clear. No pleural effusion or pneumothorax. Normal heart, mediastinum and hila.  IMPRESSION: No acute cardiopulmonary disease.   Electronically Signed   By: Lajean Manes M.D.   On: 05/14/2013 14:23    Assessment: s/p Procedure(s): LAPAROSCOPIC PARASTOMAL HERNIA Patient Active Problem List   Diagnosis Date Noted  . Parastomal hernia 05/12/2013  . Hernia 03/18/2013  . Need for prophylactic vaccination and inoculation against influenza 03/18/2013  . Colostomy malfunction 12/09/2012  . Peristomal hernia 04/19/2012  . Depression   . Seborrheic dermatitis 08/09/2010  . PERSONAL HX RECTAL CANCER 06/05/2010  . SMOKER 04/16/2010  . ALLERGIC RHINITIS 04/16/2010  . UNSPECIFIED ANEMIA 12/06/2009  . 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 HYPERTENSION 12/06/2009  . FATIGUE 12/06/2009  . DIARRHEA 12/06/2009     Plan: Ambulate, Inc spirometry Will start low residue diet PO pain meds Fevers: unknown etiology, UA does not look like UTI, no signs of pneumonia, no leg swelling, no elevated wbc.  Will schedule tylenol today and switch to PO narcotics.  Recheck wbc in AM.  No indications for antibiotics at the moment.  SL IV   LOS: 4 days     .  Rosario Adie, [facility], [place]   05/16/2013 8:36 AM

## 2013-05-16 NOTE — Progress Notes (Signed)
Pt with bed alarm sounding. Patient sitting on side of bed.  Patient moved to room 1540 to be closer to nurses station.  Call light within reach, sister called to inform of room change and patients fall.  Sister asked if any questions or concerns, none voiced at this time.

## 2013-05-16 NOTE — Progress Notes (Signed)
Patient found lying on the floor with head propped up on bedside bench.  Patient alert and orientedx4. Denies pain including headache, back, leg/hip pain. Denies tenderness to skull, spine,hips. No broken skin noted, pupils wnl,vss 117/77, 83, 102.3, 99% 2L [place], 20.  Patient states, "i was just being stubborn, and got tangled up in these hose."  Patient LOC wnl, grips wnl, ROM to joints wnl.  Notified Luz Brazen, Nursing Supervisor.  Bedside huddle performed.  Patient assisted back to bed, bed alarm reset, call light within reach, dim light left on, door open, SRx3 up, reeducated patient to call for assistance if needs to get up.  Patient verbalizes understanding.  Notified Dr Hassell Done, no new orders received at this time.  Instructed to encourage patient to CDB and use IS for temp.  Patient request family not be notified of fall until daylight. Patient informed of plan of care and demonstrates use of call light.

## 2013-05-17 LAB — BASIC METABOLIC PANEL
BUN: 11 mg/dL (ref 6–23)
CO2: 26 mEq/L (ref 19–32)
Calcium: 9.4 mg/dL (ref 8.4–10.5)
Chloride: 94 mEq/L — ABNORMAL LOW (ref 96–112)
Creatinine, Ser: 0.92 mg/dL (ref 0.50–1.10)
GFR calc Af Amer: 79 mL/min — ABNORMAL LOW (ref >90)
GFR calc non Af Amer: 68 mL/min — ABNORMAL LOW (ref >90)
Glucose, Bld: 116 mg/dL — ABNORMAL HIGH (ref 70–99)
Potassium: 3.8 mEq/L (ref 3.7–5.3)
Sodium: 136 mEq/L — ABNORMAL LOW (ref 137–147)

## 2013-05-17 LAB — CBC
HCT: 29.6 % — ABNORMAL LOW (ref 36.0–46.0)
Hemoglobin: 9.8 g/dL — ABNORMAL LOW (ref 12.0–15.0)
MCH: 29.3 pg (ref 26.0–34.0)
MCHC: 33.1 g/dL (ref 30.0–36.0)
MCV: 88.6 fL (ref 78.0–100.0)
Platelets: 313 10*3/uL (ref 150–400)
RBC: 3.34 MIL/uL — ABNORMAL LOW (ref 3.87–5.11)
RDW: 14 % (ref 11.5–15.5)
WBC: 8.4 10*3/uL (ref 4.0–10.5)

## 2013-05-17 MED ORDER — ACETAMINOPHEN 500 MG PO TABS: 500.0000 mg | ORAL_TABLET | Freq: Four times a day (QID) | ORAL | Status: DC

## 2013-05-17 MED ORDER — OXYCODONE HCL 5 MG PO TABS: 5.0000 mg | ORAL_TABLET | ORAL | Status: DC | PRN

## 2013-05-17 NOTE — Discharge Summary (Signed)
Physician Discharge Summary  Patient ID: [name] MRN: [id number] DOB/AGE: 57/06/1955 57 y.o.  Admit date: 05/12/2013 Discharge date: 05/17/2013  Admission Diagnoses: Parastomal hernia and prolapse  Discharge Diagnoses:  Active Problems:   Parastomal hernia and prolapse   Discharged Condition: good  Hospital Course: The patient was admitted to the hospital after laparoscopic parastomal hernia repair. Her pain was managed with a PCA. Her diet was advanced slowly. By postop day 5 she was ambulating without difficulty and tolerating by mouth narcotics. She did have some fevers on the night of postoperative 3. A UA was negative for any signs of infection. She had no signs of pneumonia or DVT on his clinical exam.  Her white blood cell count remained normal. Her GI function remained normal. She was placed on scheduled Tylenol and her temperatures reduced. I believe she is in stable condition for discharge to home. She will continue the scheduled Tylenol at home for 5 days.  Consults: None  Significant Diagnostic Studies: labs: cbc, chemistries  Treatments: analgesia: acetaminophen and oxycodone and surgery: Laparoscopic parastomal hernia repair, Sugar[place]aker technique  Discharge Exam: Blood pressure 119/78, pulse 75, temperature 98.1 F (36.7 C), temperature source Oral, resp. rate 18, height 5\' 6"  (1.676 m), weight 185 lb (83.915 kg), SpO2 90.00%. General appearance: alert and cooperative GI: normal findings: soft, non-tender Incision/Wound:Packed, good granulation tissue at the base.  Disposition: 01-Home or Self Care with home health and home PT   Future Appointments Provider Department Dept Phone   07/21/8117 14:78 AM Leighton Ruff, MD [facility], [place]   09/09/2013 8:00 AM [facility] [phone]   09/09/2013 9:00 AM [facility] [phone]   Please pick up your oral  contrast prep at your scheduled location at least 1 day prior to your appointment. Liquids only 4 hours prior to your exam. Any medications can be taken as usual. Please arrive 15 min prior to your scheduled exam time.   09/16/2013 8:30 AM Heath Lark, MD [facility] [phone]       Medication List    STOP taking these medications       loperamide 2 MG capsule  Commonly known as:  IMODIUM      TAKE these medications       acetaminophen 500 MG tablet  Commonly known as:  TYLENOL  Take 1 tablet (500 mg total) by mouth every 6 (six) hours.     ALIGN 4 MG Caps  Take 1 capsule by mouth daily.     ALPRAZolam 0.5 MG 24 hr tablet  Commonly known as:  XANAX XR  Take 0.5 mg by mouth at bedtime as needed for anxiety or sleep.     amitriptyline 25 MG tablet  Commonly known as:  ELAVIL  Take 12.5-25 mg by mouth at bedtime.     buPROPion 75 MG tablet  Commonly known as:  WELLBUTRIN  Take 75 mg by mouth 2 (two) times daily.     citalopram 20 MG tablet  Commonly known as:  CELEXA  Take 20 mg by mouth every morning.     gabapentin 400 MG capsule  Commonly known as:  NEURONTIN  Take 800 mg by mouth 3 (three) times daily.     hydrochlorothiazide 25 MG tablet  Commonly known as:  HYDRODIURIL  Take 25 mg by mouth every morning.     oxyCODONE 5 MG immediate release tablet  Commonly known  as:  Oxy IR/ROXICODONE  Take 1-2 tablets (5-10 mg total) by mouth every 4 (four) hours as needed for moderate pain, severe pain or breakthrough pain.           Follow-up Information   Follow up with Rosario Adie., MD. Schedule an appointment as soon as possible for a visit in 2 weeks.   Specialty:  General Surgery   Contact information:   [address]  [place] [address] [phone]       Signed: Rosario Adie 12/20/3580, 5:05 AM

## 2013-05-17 NOTE — Progress Notes (Signed)
Received referral from inpatient RNCM. Met with patient at bedside to explain and offer [facility] services. She states " you can offer those services to someone else who needs them". Left brochure at bedside for her to call in the future if she should change her mind. Made inpatient RNCM aware of patient's declination of services for [facility] Care Management.  Kimberly Rolling, MSN, RN,BSN- [facility] [phone]

## 2013-05-17 NOTE — Consult Note (Signed)
WOC ostomy follow up Stoma type/location: LLQ Colostomy.   Stomal assessment/size:  1 5/8" today.   Peristomal assessment: Skin intact with surgical incision 4 cm from stoma.  NS moist dressing applied to surgical wound.  Wound measures 5 cm x 4.2 cm x 2 cm.  100% beefy red.  One suture noted in wound bed.  Treatment options for stomal/peristomal skin:  Barrier ring placed around stoma as stoma is more flush from 4 to 9 o'clock.  Brown stool noted in  Pouch.  Patient is provided with 2 3/4" 2 piece pouching system.  States she is prepared for her discharge today.  Student nurse in the room and assisted with ostomy and wound care.  Output  Soft, brown stool.  Ostomy pouching:2pc.  Education provided:  Discussed need for barrier ring with pouching and to measure stoma at each pouch change for at least six weeks. Patient is to have [facility] with [facility] and orders supplies through [facility].  Demonstrated NS moist to moist dressing change that patient and HH will provide after discharge home.  Patient verbalized understanding and was comfortable with dressing change.  Enrolled patient in [place] Start Discharge program: Yes Will not follow at this time.  Please re-consult if needed.  Domenic Moras RN BSN [place] Pager [phone]

## 2013-05-17 NOTE — Discharge Instructions (Addendum)
** **  NO SMOKING  ** **    HERNIA REPAIR: POST OP INSTRUCTIONS  1. DIET: Follow a light bland diet the first 24 hours after arrival home, such as soup, liquids, crackers, etc.  Be sure to include lots of fluids daily.  Avoid fast food or heavy meals as your are more likely to get nauseated.  Eat a low fat the next few days after surgery. 2. Take your usually prescribed home medications unless otherwise directed. 3. PAIN CONTROL: a. Pain is best controlled by a usual combination of three different methods TOGETHER: i. Ice/Heat ii. Over the counter pain medication iii. Prescription pain medication b. Most patients will experience some swelling and bruising around the hernia(s) such as the bellybutton, groins, or old incisions.  Ice packs or heating pads (30-60 minutes up to 6 times a day) will help. Use ice for the first few days to help decrease swelling and bruising, then switch to heat to help relax tight/sore spots and speed recovery.  Some people prefer to use ice alone, heat alone, alternating between ice & heat.  Experiment to what works for you.  Swelling and bruising can take several weeks to resolve.   c. It is helpful to take an over-the-counter pain medication regularly for the first few weeks.  Choose one of the following that works best for you: i. Tylenol 500-650mg  by mouth every 6 hours for 5 days d. A  prescription for pain medication should be given to you upon discharge.  Take your pain medication as prescribed.  i. If you are having problems/concerns with the prescription medicine (does not control pain, nausea, vomiting, rash, itching, etc), please call us [phone] to see if we need to switch you to a different pain medicine that will work better for you and/or control your side effect better. ii. If you need a refill on your pain medication, please contact your pharmacy.  They will contact our office to request authorization. Prescriptions will not be filled after 5 pm  or on week-ends. 4. Avoid getting constipated.  Between the surgery and the pain medications, it is common to experience some constipation.  Increasing fluid intake and taking a fiber supplement (such as Metamucil, Citrucel, FiberCon, MiraLax, etc) 1-2 times a day regularly will usually help prevent this problem from occurring.  A mild laxative (prune juice, Milk of Magnesia, MiraLax, etc) should be taken according to package directions if there are no bowel movements after 48 hours.   5. Wash / shower every day.  You may shower over the dressings as they are waterproof.   6. Pack wound daily.  Remove dressing and unpack wound.  Clean with soap and water.  Pack with moistened gauze.  Cover with a dry dressing. 7. ACTIVITIES as tolerated:   a. You may resume regular (light) daily activities beginning the next day--such as daily self-care, walking, climbing stairs--gradually increasing activities as tolerated.  If you can walk 30 minutes without difficulty, it is safe to try more intense activity such as jogging, treadmill, bicycling, low-impact aerobics, swimming, etc. b. Save the most intensive and strenuous activity for last such as sit-ups, heavy lifting, contact sports, etc  Refrain from any heavy lifting or straining until you are off narcotics for pain control.   c. DO NOT PUSH THROUGH PAIN.  Let pain be your guide: If it hurts to do something, don't do it.  Pain is your body warning you to avoid that activity for another week until the  pain goes down. d. You may drive when you are no longer taking prescription pain medication, you can comfortably wear a seatbelt, and you can safely maneuver your car and apply brakes. e. Dennis Bast may have sexual intercourse when it is comfortable.  8. FOLLOW UP in our office a. Please call [facility] at [phone] to set up an appointment to see your surgeon in the office for a follow-up appointment approximately 2-3 weeks after your surgery. b. Make sure that you call for  this appointment the day you arrive home to insure a convenient appointment time. 9.  IF YOU HAVE DISABILITY OR FAMILY LEAVE FORMS, BRING THEM TO THE OFFICE FOR PROCESSING.  DO NOT GIVE THEM TO YOUR DOCTOR.  WHEN TO CALL us [phone]: 1. Poor pain control 2. Reactions / problems with new medications (rash/itching, nausea, etc)  3. Fever over 101.5 F (38.5 C) 4. Inability to urinate 5. Nausea and/or vomiting 6. Worsening swelling or bruising 7. Continued bleeding from incision. 8. Increased pain, redness, or drainage from the incision   The clinic staff is available to answer your questions during regular business hours (8:30am-5pm).  Please dont hesitate to call and ask to speak to one of our nurses for clinical concerns.   If you have a medical emergency, go to the nearest emergency room or call 911.  A surgeon from [facility] is always on call at the hospitals in [facility], [place], [place], [place], [place]  [address] ?  [address] [address], [place], [place]   [address] MAIN: [phone] ? TOLL FREE: [phone] ? FAX: [phone] [email or web address]

## 2013-05-19 DIAGNOSIS — D649 Anemia, unspecified: Secondary | ICD-10-CM | POA: Diagnosis not present

## 2013-05-19 DIAGNOSIS — Z933 Colostomy status: Secondary | ICD-10-CM | POA: Diagnosis not present

## 2013-05-19 DIAGNOSIS — IMO0002 Reserved for concepts with insufficient information to code with codable children: Secondary | ICD-10-CM | POA: Diagnosis not present

## 2013-05-19 DIAGNOSIS — Z48815 Encounter for surgical aftercare following surgery on the digestive system: Secondary | ICD-10-CM | POA: Diagnosis not present

## 2013-05-19 DIAGNOSIS — I10 Essential (primary) hypertension: Secondary | ICD-10-CM | POA: Diagnosis not present

## 2013-05-19 DIAGNOSIS — Z85048 Personal history of other malignant neoplasm of rectum, rectosigmoid junction, and anus: Secondary | ICD-10-CM | POA: Diagnosis not present

## 2013-05-20 ENCOUNTER — Telehealth (INDEPENDENT_AMBULATORY_CARE_PROVIDER_SITE_OTHER): Payer: Self-pay | Admitting: *Deleted

## 2013-05-20 NOTE — Telephone Encounter (Signed)
Pt called in stating that she was discharged from the hospital on Tuesday and no one from [facility] called her until yesterday and someone came out yesterday and watched her do the dressing change herself and they stated they would set up for someone to come check on her next week.  I informed her that at times the insurance company will not continue to pay for a [facility] RN to come out if the pt is able to do the dressing change themselves.  She states that she has been changing the dressing herself since she was discharged but it is very difficult for her due to the pain.  I informed her that I would get in touch with [facility] and see what their plan was.  She wants to make sure Dr. Marcello Moores is aware and I informed her that I would route this message to her as well.  After I get a response from [facility] I will get in touch with the pt.  She is agreeable at this time.

## 2013-05-20 NOTE — Telephone Encounter (Signed)
Agree with pt.  She lives alone and needs the help.  Please contact [facility].  Pt can come to the office for dressing changes if she can arrange transportation.

## 2013-05-20 NOTE — Telephone Encounter (Signed)
I spoke with [name] at [facility] and she contacted the patient and a nurse will be going out tomorrow 05/21/13 to assist her with the wound care.  I called pt to ensure she received the same message and she was very appreciative.  I instructed her to call us back with any other questions or concerns.  She is agreeable with plan of care at this time.

## 2013-05-23 ENCOUNTER — Encounter (HOSPITAL_COMMUNITY): Payer: Self-pay | Admitting: Emergency Medicine

## 2013-05-23 ENCOUNTER — Emergency Department (HOSPITAL_COMMUNITY)
Admission: EM | Admit: 2013-05-23 | Discharge: 2013-05-23 | Disposition: A | Discharge status: Home / Self Care | Payer: Medicare Other | Attending: Emergency Medicine | Admitting: Emergency Medicine

## 2013-05-23 DIAGNOSIS — Z85048 Personal history of other malignant neoplasm of rectum, rectosigmoid junction, and anus: Secondary | ICD-10-CM | POA: Insufficient documentation

## 2013-05-23 DIAGNOSIS — IMO0002 Reserved for concepts with insufficient information to code with codable children: Secondary | ICD-10-CM | POA: Diagnosis not present

## 2013-05-23 DIAGNOSIS — Z862 Personal history of diseases of the blood and blood-forming organs and certain disorders involving the immune mechanism: Secondary | ICD-10-CM | POA: Insufficient documentation

## 2013-05-23 DIAGNOSIS — Y92009 Unspecified place in unspecified non-institutional (private) residence as the place of occurrence of the external cause: Secondary | ICD-10-CM | POA: Insufficient documentation

## 2013-05-23 DIAGNOSIS — Y939 Activity, unspecified: Secondary | ICD-10-CM | POA: Insufficient documentation

## 2013-05-23 DIAGNOSIS — Z8719 Personal history of other diseases of the digestive system: Secondary | ICD-10-CM | POA: Diagnosis not present

## 2013-05-23 DIAGNOSIS — S91311A Laceration without foreign body, right foot, initial encounter: Secondary | ICD-10-CM

## 2013-05-23 DIAGNOSIS — G589 Mononeuropathy, unspecified: Secondary | ICD-10-CM | POA: Diagnosis not present

## 2013-05-23 DIAGNOSIS — Z860100 Personal history of colon polyps, unspecified: Secondary | ICD-10-CM | POA: Insufficient documentation

## 2013-05-23 DIAGNOSIS — M79609 Pain in unspecified limb: Secondary | ICD-10-CM | POA: Diagnosis not present

## 2013-05-23 DIAGNOSIS — F3289 Other specified depressive episodes: Secondary | ICD-10-CM | POA: Insufficient documentation

## 2013-05-23 DIAGNOSIS — F17200 Nicotine dependence, unspecified, uncomplicated: Secondary | ICD-10-CM | POA: Insufficient documentation

## 2013-05-23 DIAGNOSIS — D649 Anemia, unspecified: Secondary | ICD-10-CM | POA: Diagnosis not present

## 2013-05-23 DIAGNOSIS — F329 Major depressive disorder, single episode, unspecified: Secondary | ICD-10-CM | POA: Insufficient documentation

## 2013-05-23 DIAGNOSIS — W01119A Fall on same level from slipping, tripping and stumbling with subsequent striking against unspecified sharp object, initial encounter: Secondary | ICD-10-CM | POA: Insufficient documentation

## 2013-05-23 DIAGNOSIS — Z880 Allergy status to penicillin: Secondary | ICD-10-CM | POA: Insufficient documentation

## 2013-05-23 DIAGNOSIS — G709 Myoneural disorder, unspecified: Secondary | ICD-10-CM | POA: Insufficient documentation

## 2013-05-23 DIAGNOSIS — I10 Essential (primary) hypertension: Secondary | ICD-10-CM | POA: Diagnosis not present

## 2013-05-23 DIAGNOSIS — Z79899 Other long term (current) drug therapy: Secondary | ICD-10-CM | POA: Insufficient documentation

## 2013-05-23 DIAGNOSIS — Z933 Colostomy status: Secondary | ICD-10-CM | POA: Diagnosis not present

## 2013-05-23 DIAGNOSIS — S91309A Unspecified open wound, unspecified foot, initial encounter: Secondary | ICD-10-CM | POA: Insufficient documentation

## 2013-05-23 DIAGNOSIS — Z48815 Encounter for surgical aftercare following surgery on the digestive system: Secondary | ICD-10-CM | POA: Diagnosis not present

## 2013-05-23 NOTE — ED Notes (Signed)
Per EMS pt fell in her bathroom resulting in a laceration to her pinky toe on her right foot.  Bleeding is controlled.

## 2013-05-23 NOTE — ED Notes (Signed)
Bed: WA20 Expected date: 05/23/13 Expected time: 6:41 AM Means of arrival: Ambulance Comments: Fall, foot laceration

## 2013-05-23 NOTE — ED Provider Notes (Signed)
CSN: [id number]     Arrival date & time 05/23/13  8280 History   First MD Initiated Contact with Patient 05/23/13 [phone]     Chief Complaint  Patient presents with  . Fall   (Consider location/radiation/quality/duration/timing/severity/associated sxs/prior Treatment) Patient is a 58 y.o. female presenting with fall. The history is provided by the patient.  Fall   She injured her foot while in the bathroom today. Isolated injury to the bottom of her. She apparently stepped on something causing the injury, causing a fall, no other injury. She recently had abdominal surgery to replace an ostomy. She denies abdominal pain, head injury, neck pain or neck pain. She states that her tetanus has been updated within the last 5 years. There are no other known modifying factors.   Past Medical History  Diagnosis Date  . 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 Hypertension   . Diverticulosis   . Radiation proctitis     mild  . Adenomatous colon polyp   . Anemia, unspecified   . Depression   . Nonspecific colitis 10/10/11  . Cocaine abuse     as recently as 10/21/11  . Hernia 03/18/2013  . Neuromuscular disorder   . 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 Incontinence of bowel     since colostomy  . History of transfusion   . Difficulty sleeping     takes meds to sleep   Past Surgical History  Procedure Laterality Date  . Mouth surgery  10/2009  . Low anterior bowel resection  03/07/09  . Colostomy  2013    [facility]  . Portacath placement      and removal  . Laparoscopic parastomal hernia N/A 05/12/2013    Procedure: LAPAROSCOPIC PARASTOMAL HERNIA;  Surgeon: Leighton Ruff, MD;  Location: [facility];  Service: General;  Laterality: N/A;   Family History  Problem Relation Age of Onset  . Colon cancer Mother   . Stomach cancer Sister   . Colon polyps Sister   . Diabetes Father   . Cirrhosis Brother   . Pulmonary Hypertension Brother    History  Substance  Use Topics  . Smoking status: Current Every Day Smoker -- 40 years    Types: Cigarettes  . Smokeless tobacco: Never Used     Comment: form given 04-15-12.divorced, lives with sister Willaim Rayas. Food Service at [employer]  . Alcohol Use: Yes     Comment: occasional  driink   OB History   Grav Para Term Preterm Abortions TAB SAB Ect Mult Living                 Review of Systems  All other systems reviewed and are negative.    Allergies  Ampicillin; Hydrocodone; and Penicillins  Home Medications   Current Outpatient Rx  Name  Route  Sig  Dispense  Refill  . acetaminophen (TYLENOL) 500 MG tablet   Oral   Take 1,000 mg by mouth every 4 (four) hours as needed for mild pain or headache.         . ALPRAZolam (XANAX XR) 0.5 MG 24 hr tablet   Oral   Take 0.5 mg by mouth at bedtime as needed for anxiety or sleep.         [name] amitriptyline (ELAVIL) 25 MG tablet   Oral   Take 25 mg by mouth at bedtime.          [name]  buPROPion (WELLBUTRIN) 75 MG tablet   Oral   Take 75 mg by mouth 2 (two) times daily.         . citalopram (CELEXA) 20 MG tablet   Oral   Take 20 mg by mouth every morning.         . gabapentin (NEURONTIN) 300 MG capsule   Oral   Take 600-900 mg by mouth 3 (three) times daily. Takes 900mg  in the morning and at bedtime. And takes 600mg  at noon.         . hydrochlorothiazide (HYDRODIURIL) 25 MG tablet   Oral   Take 25 mg by mouth every morning.         . loperamide (IMODIUM) 2 MG capsule   Oral   Take 2 mg by mouth 4 (four) times daily as needed for diarrhea or loose stools.         [name] OVER THE COUNTER MEDICATION   Oral   Take 2 tablets by mouth every 4 (four) hours as needed (pain). Med has aspirin and tylenol         . oxyCODONE (OXY IR/ROXICODONE) 5 MG immediate release tablet   Oral   Take 1-2 tablets (5-10 mg total) by mouth every 4 (four) hours as needed for moderate pain, severe pain or breakthrough pain.   30 tablet    0   . Probiotic Product (ALIGN) 4 MG CAPS   Oral   Take 1 capsule by mouth daily.          BP 116/64  Pulse 66  Temp(Src) 98.9 F (37.2 C) (Oral)  Resp 18  Ht 5\' 6"  (1.676 m)  Wt 185 lb (83.915 kg)  BMI 29.87 kg/m2  SpO2 99% Physical Exam  Nursing note and vitals reviewed. Constitutional: She is oriented to person, place, and time. She appears well-developed and well-nourished.  HENT:  Head: Normocephalic and atraumatic.  Eyes: Conjunctivae and EOM are normal. Pupils are equal, round, and reactive to light.  Neck: Normal range of motion and phonation normal. Neck supple.  Cardiovascular: Normal rate.   Pulmonary/Chest: Effort normal. She exhibits no tenderness.  Abdominal: Soft. She exhibits no distension. There is no tenderness. There is no guarding.  Musculoskeletal: Normal range of motion. She exhibits no tenderness.  Right foot, fifth toe, with laceration at the flexion MTP flexion crease. The wound gapes somewhat and the toe is mal- aligned.  Neurological: She is alert and oriented to person, place, and time. She exhibits normal muscle tone.  Skin: Skin is warm and dry.  Psychiatric: She has a normal mood and affect. Her behavior is normal. Judgment and thought content normal.    ED Course  Procedures (including critical care time) Medications - No data to display  Patient Vitals for the past 24 hrs:  BP Temp Temp src Pulse Resp SpO2 Height Weight  05/23/13 0813 - - - - 18 - - -  05/23/13 0809 116/64 mmHg - - 66 - 99 % - -  05/23/13 0658 107/73 mmHg 98.9 F (37.2 C) Oral 68 18 100 % 5\' 6"  (1.676 m) 185 lb (83.915 kg)  05/23/13 0653 - - - - - 98 % - -    LACERATION REPAIR Performed by: me Consent: Verbal consent obtained. Risks and benefits: risks, benefits and alternatives were discussed Patient identity confirmed: provided demographic data Time out performed prior to procedure Prepped and Draped in normal sterile fashion Wound explored Laceration  Location: 5th toe plantar Laceration Length: 4.0cm No  Foreign Bodies seen or palpated Anesthesia: local infiltration Local anesthetic: lidocaine 2% no epinephrine Anesthetic total: 4 ml Irrigation method: syringe Amount of cleaning: standard Skin closure: 4-0 prolene Number of sutures or staples: 6 Technique: simple, loose to prevent inversion of skin edges Patient tolerance: Patient tolerated the procedure well with no immediate complications.   7:40 AM Reevaluation with update and discussion. After initial assessment and treatment, an updated evaluation reveals comfortable. Treatment and plans discussed, questions answered. Rexford Prevo L      MDM   1. Laceration of foot, right      Fall facial injury, laceration, foot. Doubt fracture, medical instability, or remote injury  Nursing Notes Reviewed/ Care Coordinated Applicable Imaging Reviewed Interpretation of Laboratory Data incorporated into ED treatment  The patient appears reasonably screened and/or stabilized for discharge and I doubt any other medical condition or other [facility] requiring further screening, evaluation, or treatment in the ED at this time prior to discharge.  Plan: Home Medications- Tylenol; Home Treatments- rest, wound care; return here if the recommended treatment, does not improve the symptoms; Recommended follow up- PCP for suture removal in 7-8 days    Richarda Blade, MD 05/23/13 1538

## 2013-05-23 NOTE — ED Notes (Signed)
MD at bedside. 

## 2013-05-23 NOTE — Discharge Instructions (Signed)
Sutured Wound Care °Sutures are stitches that can be used to close wounds. Wound care helps prevent pain and infection.  °HOME CARE INSTRUCTIONS  °· Rest and elevate the injured area until all the pain and swelling are gone. °· Only take over-the-counter or prescription medicines for pain, discomfort, or fever as directed by your caregiver. °· After 48 hours, gently wash the area with mild soap and water once a day, or as directed. Rinse off the soap. Pat the area dry with a clean towel. Do not rub the wound. This may cause bleeding. °· Follow your caregiver's instructions for how often to change the bandage (dressing). Stop using a dressing after 2 days or after the wound stops draining. °· If the dressing sticks, moisten it with soapy water and gently remove it. °· Apply ointment on the wound as directed. °· Avoid stretching a sutured wound. °· Drink enough fluids to keep your urine clear or pale yellow. °· Follow up with your caregiver for suture removal as directed. °· Use sunscreen on your wound for the next 3 to 6 months so the scar will not darken. °SEEK IMMEDIATE MEDICAL CARE IF:  °· Your wound becomes red, swollen, hot, or tender. °· You have increasing pain in the wound. °· You have a red streak that extends from the wound. °· There is pus coming from the wound. °· You have a fever. °· You have shaking chills. °· There is a bad smell coming from the wound. °· You have persistent bleeding from the wound. °MAKE SURE YOU:  °· Understand these instructions. °· Will watch your condition. °· Will get help right away if you are not doing well or get worse. °Document Released: 05/08/2004 Document Revised: 06/23/2011 Document Reviewed: 08/04/2010 °ExitCare® Patient Information ©2014 [employer]. ° °

## 2013-05-23 NOTE — ED Notes (Signed)
Per pt she falls frequently at home d/t her neuropathy.  Denies pain.

## 2013-05-24 ENCOUNTER — Ambulatory Visit (INDEPENDENT_AMBULATORY_CARE_PROVIDER_SITE_OTHER): Payer: Medicare Other | Admitting: General Surgery

## 2013-05-24 ENCOUNTER — Encounter (INDEPENDENT_AMBULATORY_CARE_PROVIDER_SITE_OTHER): Payer: Self-pay | Admitting: General Surgery

## 2013-05-24 VITALS — BP 112/68 | HR 80 | Temp 98.9°F | Resp 14 | Ht 66.0 in | Wt 172.6 lb

## 2013-05-24 DIAGNOSIS — Z933 Colostomy status: Secondary | ICD-10-CM | POA: Diagnosis not present

## 2013-05-24 DIAGNOSIS — Z85048 Personal history of other malignant neoplasm of rectum, rectosigmoid junction, and anus: Secondary | ICD-10-CM | POA: Diagnosis not present

## 2013-05-24 DIAGNOSIS — Z48815 Encounter for surgical aftercare following surgery on the digestive system: Secondary | ICD-10-CM | POA: Diagnosis not present

## 2013-05-24 DIAGNOSIS — IMO0002 Reserved for concepts with insufficient information to code with codable children: Secondary | ICD-10-CM | POA: Diagnosis not present

## 2013-05-24 DIAGNOSIS — D649 Anemia, unspecified: Secondary | ICD-10-CM | POA: Diagnosis not present

## 2013-05-24 DIAGNOSIS — Z9889 Other specified postprocedural states: Secondary | ICD-10-CM

## 2013-05-24 DIAGNOSIS — I10 Essential (primary) hypertension: Secondary | ICD-10-CM | POA: Diagnosis not present

## 2013-05-24 MED ORDER — OXYCODONE HCL 5 MG PO TABS: 5.0000 mg | ORAL_TABLET | ORAL | Status: DC | PRN

## 2013-05-24 NOTE — Patient Instructions (Signed)
Continue packing wound daily.  Okay to start advancing activity. Okay to start and unrestricted diet. No heavy lifting greater than 20 pounds for 3 months.

## 2013-05-24 NOTE — Progress Notes (Signed)
[name] is a 58 y.o. female who is status post a peristomal hernia repair on 1/29.  She is doing well.  Her fevers have went away.  Her appetite is coming back slowly. She is increasing her activity slowly as well. She did have a couple falls due to a slippery floor. One of which required stitches in the emergency department of her foot. She denies any dizziness or being lightheaded.  Her ostomy is functioning well. She was packing her wound daily.  Objective: Filed Vitals:   05/24/13 1041  BP: 112/68  Pulse: 80  Temp: 98.9 F (37.2 C)  Resp: 14    General appearance: alert and cooperative GI: normal findings: soft, non-tender ostomy pink, flat and viable.  Incision: Wound packed. Small area of tunneling laterally. Appears to be closing well.   Assessment: s/p  Patient Active Problem List   Diagnosis Date Noted  . Parastomal hernia 05/12/2013  . Hernia 03/18/2013  . Need for prophylactic vaccination and inoculation against influenza 03/18/2013  . Colostomy malfunction 12/09/2012  . Peristomal hernia 04/19/2012  . Depression   . Seborrheic dermatitis 08/09/2010  . PERSONAL HX RECTAL CANCER 06/05/2010  . SMOKER 04/16/2010  . ALLERGIC RHINITIS 04/16/2010  . UNSPECIFIED ANEMIA 12/06/2009  . 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 HYPERTENSION 12/06/2009  . FATIGUE 12/06/2009  . DIARRHEA 12/06/2009    Plan: Patient seems to be doing well after surgery except for some falls at home. I have refilled her oxycodone prescription. She will continue to change her dressing daily. I will see her back in the office in 2 weeks.    Rosario Adie, [facility], [place] [phone]   05/24/2013 10:43 AM

## 2013-05-26 DIAGNOSIS — Z48815 Encounter for surgical aftercare following surgery on the digestive system: Secondary | ICD-10-CM | POA: Diagnosis not present

## 2013-05-26 DIAGNOSIS — I10 Essential (primary) hypertension: Secondary | ICD-10-CM | POA: Diagnosis not present

## 2013-05-26 DIAGNOSIS — Z85048 Personal history of other malignant neoplasm of rectum, rectosigmoid junction, and anus: Secondary | ICD-10-CM | POA: Diagnosis not present

## 2013-05-26 DIAGNOSIS — Z933 Colostomy status: Secondary | ICD-10-CM | POA: Diagnosis not present

## 2013-05-26 DIAGNOSIS — D649 Anemia, unspecified: Secondary | ICD-10-CM | POA: Diagnosis not present

## 2013-05-26 DIAGNOSIS — IMO0002 Reserved for concepts with insufficient information to code with codable children: Secondary | ICD-10-CM | POA: Diagnosis not present

## 2013-05-30 ENCOUNTER — Telehealth (INDEPENDENT_AMBULATORY_CARE_PROVIDER_SITE_OTHER): Payer: Self-pay

## 2013-05-30 DIAGNOSIS — I10 Essential (primary) hypertension: Secondary | ICD-10-CM | POA: Diagnosis not present

## 2013-05-30 DIAGNOSIS — D649 Anemia, unspecified: Secondary | ICD-10-CM | POA: Diagnosis not present

## 2013-05-30 DIAGNOSIS — Z933 Colostomy status: Secondary | ICD-10-CM | POA: Diagnosis not present

## 2013-05-30 DIAGNOSIS — Z48815 Encounter for surgical aftercare following surgery on the digestive system: Secondary | ICD-10-CM | POA: Diagnosis not present

## 2013-05-30 DIAGNOSIS — IMO0002 Reserved for concepts with insufficient information to code with codable children: Secondary | ICD-10-CM | POA: Diagnosis not present

## 2013-05-30 DIAGNOSIS — Z85048 Personal history of other malignant neoplasm of rectum, rectosigmoid junction, and anus: Secondary | ICD-10-CM | POA: Diagnosis not present

## 2013-05-30 NOTE — Telephone Encounter (Signed)
Can refill but she needs to start tapering down and use tylenol and ibuprofen on a scheduled basis.  I can print and sign Rx this afternoon when I get to the office.

## 2013-05-30 NOTE — Telephone Encounter (Signed)
Pt is s/p laparoscopic parastomal hernia repair on 05/12/13 by Dr. Marcello Moores.  She is calling to request a refill of her Oxycodone 5mg .  She received a refill for #30 on 05/24/13.  She says that she still needs to take 2 q 4 hours for her pain.  I told her the request would be sent to Dr. Marcello Moores.  If approved, she would need to have someone come to our office to p/u.  Pt understood and agreed.

## 2013-05-30 NOTE — Telephone Encounter (Signed)
Pt made aware of Dr. Marcello Moores' instructions and told she would receive a phone call this afternoon when the Rx is ready to be picked up.

## 2013-05-31 ENCOUNTER — Other Ambulatory Visit (INDEPENDENT_AMBULATORY_CARE_PROVIDER_SITE_OTHER): Payer: Self-pay | Admitting: General Surgery

## 2013-05-31 ENCOUNTER — Ambulatory Visit: Payer: Medicare Other | Admitting: Internal Medicine

## 2013-05-31 DIAGNOSIS — Z9889 Other specified postprocedural states: Secondary | ICD-10-CM

## 2013-05-31 MED ORDER — OXYCODONE HCL 5 MG PO TABS: 5.0000 mg | ORAL_TABLET | ORAL | Status: DC | PRN

## 2013-05-31 NOTE — Telephone Encounter (Signed)
Attempted to call patient to make her aware that her prescription is at the front desk for pickup.  Home number rings to a fax machine and cell phone number just rings.  Unable to get in touch with patient at this time.

## 2013-05-31 NOTE — Telephone Encounter (Signed)
Rx printed and signed.  Given to [facility]

## 2013-06-01 DIAGNOSIS — D649 Anemia, unspecified: Secondary | ICD-10-CM | POA: Diagnosis not present

## 2013-06-01 DIAGNOSIS — Z85048 Personal history of other malignant neoplasm of rectum, rectosigmoid junction, and anus: Secondary | ICD-10-CM | POA: Diagnosis not present

## 2013-06-01 DIAGNOSIS — Z933 Colostomy status: Secondary | ICD-10-CM | POA: Diagnosis not present

## 2013-06-01 DIAGNOSIS — Z48815 Encounter for surgical aftercare following surgery on the digestive system: Secondary | ICD-10-CM | POA: Diagnosis not present

## 2013-06-01 DIAGNOSIS — I10 Essential (primary) hypertension: Secondary | ICD-10-CM | POA: Diagnosis not present

## 2013-06-01 DIAGNOSIS — IMO0002 Reserved for concepts with insufficient information to code with codable children: Secondary | ICD-10-CM | POA: Diagnosis not present

## 2013-06-02 DIAGNOSIS — Z85048 Personal history of other malignant neoplasm of rectum, rectosigmoid junction, and anus: Secondary | ICD-10-CM | POA: Diagnosis not present

## 2013-06-02 DIAGNOSIS — I10 Essential (primary) hypertension: Secondary | ICD-10-CM | POA: Diagnosis not present

## 2013-06-02 DIAGNOSIS — IMO0002 Reserved for concepts with insufficient information to code with codable children: Secondary | ICD-10-CM | POA: Diagnosis not present

## 2013-06-02 DIAGNOSIS — D649 Anemia, unspecified: Secondary | ICD-10-CM | POA: Diagnosis not present

## 2013-06-02 DIAGNOSIS — Z933 Colostomy status: Secondary | ICD-10-CM | POA: Diagnosis not present

## 2013-06-02 DIAGNOSIS — Z48815 Encounter for surgical aftercare following surgery on the digestive system: Secondary | ICD-10-CM | POA: Diagnosis not present

## 2013-06-03 ENCOUNTER — Telehealth: Payer: Self-pay | Admitting: *Deleted

## 2013-06-03 ENCOUNTER — Ambulatory Visit: Payer: Medicare Other | Admitting: Internal Medicine

## 2013-06-03 NOTE — Telephone Encounter (Signed)
ToShelba Flake Fax: [phone] From: [facility] Date/ Time: 06/02/2013 11:59 PM Taken By: Benita Stabile, RN Caller: [place]: not collected Patient: [name] DOB: 1955/08/11 Phone: [phone] Reason for Call: See info below Regarding Appointment: Yes Appt Date: 06/03/2013 Appt Time: 0830 Provider: Gwendolyn Grant (Adults only) Reason: Cancel Appointment Details: lack of transportation Outcome: Cancelled appointment in [facility])

## 2013-06-06 DIAGNOSIS — Z85048 Personal history of other malignant neoplasm of rectum, rectosigmoid junction, and anus: Secondary | ICD-10-CM | POA: Diagnosis not present

## 2013-06-06 DIAGNOSIS — D649 Anemia, unspecified: Secondary | ICD-10-CM | POA: Diagnosis not present

## 2013-06-06 DIAGNOSIS — I10 Essential (primary) hypertension: Secondary | ICD-10-CM | POA: Diagnosis not present

## 2013-06-06 DIAGNOSIS — Z48815 Encounter for surgical aftercare following surgery on the digestive system: Secondary | ICD-10-CM | POA: Diagnosis not present

## 2013-06-06 DIAGNOSIS — Z933 Colostomy status: Secondary | ICD-10-CM | POA: Diagnosis not present

## 2013-06-06 DIAGNOSIS — IMO0002 Reserved for concepts with insufficient information to code with codable children: Secondary | ICD-10-CM | POA: Diagnosis not present

## 2013-06-07 ENCOUNTER — Ambulatory Visit: Payer: Medicare Other | Admitting: Internal Medicine

## 2013-06-07 DIAGNOSIS — Z48815 Encounter for surgical aftercare following surgery on the digestive system: Secondary | ICD-10-CM | POA: Diagnosis not present

## 2013-06-07 DIAGNOSIS — Z933 Colostomy status: Secondary | ICD-10-CM | POA: Diagnosis not present

## 2013-06-07 DIAGNOSIS — I10 Essential (primary) hypertension: Secondary | ICD-10-CM | POA: Diagnosis not present

## 2013-06-07 DIAGNOSIS — IMO0002 Reserved for concepts with insufficient information to code with codable children: Secondary | ICD-10-CM | POA: Diagnosis not present

## 2013-06-07 DIAGNOSIS — D649 Anemia, unspecified: Secondary | ICD-10-CM | POA: Diagnosis not present

## 2013-06-07 DIAGNOSIS — Z85048 Personal history of other malignant neoplasm of rectum, rectosigmoid junction, and anus: Secondary | ICD-10-CM | POA: Diagnosis not present

## 2013-06-08 DIAGNOSIS — D649 Anemia, unspecified: Secondary | ICD-10-CM | POA: Diagnosis not present

## 2013-06-08 DIAGNOSIS — Z933 Colostomy status: Secondary | ICD-10-CM | POA: Diagnosis not present

## 2013-06-08 DIAGNOSIS — IMO0002 Reserved for concepts with insufficient information to code with codable children: Secondary | ICD-10-CM | POA: Diagnosis not present

## 2013-06-08 DIAGNOSIS — Z48815 Encounter for surgical aftercare following surgery on the digestive system: Secondary | ICD-10-CM | POA: Diagnosis not present

## 2013-06-08 DIAGNOSIS — Z85048 Personal history of other malignant neoplasm of rectum, rectosigmoid junction, and anus: Secondary | ICD-10-CM | POA: Diagnosis not present

## 2013-06-08 DIAGNOSIS — I10 Essential (primary) hypertension: Secondary | ICD-10-CM | POA: Diagnosis not present

## 2013-06-10 ENCOUNTER — Ambulatory Visit: Payer: Medicare Other | Admitting: Internal Medicine

## 2013-06-13 ENCOUNTER — Other Ambulatory Visit: Payer: Self-pay | Admitting: Internal Medicine

## 2013-06-13 ENCOUNTER — Ambulatory Visit (INDEPENDENT_AMBULATORY_CARE_PROVIDER_SITE_OTHER): Payer: Medicare Other | Admitting: General Surgery

## 2013-06-13 ENCOUNTER — Encounter (INDEPENDENT_AMBULATORY_CARE_PROVIDER_SITE_OTHER): Payer: Self-pay

## 2013-06-13 DIAGNOSIS — Z9889 Other specified postprocedural states: Secondary | ICD-10-CM

## 2013-06-13 NOTE — Patient Instructions (Signed)
F/U in 3 wks.  Cont current care

## 2013-06-13 NOTE — Progress Notes (Signed)
[name] is a 58 y.o. female who is status post a parastomal hernia repair.  She is doing well.  Her wound is closing.    Objective: 170kg 124/84 72 97.6 F General appearance: alert and cooperative GI: normal findings: soft, non-tender  Incision: healing well, ~3cm deep and 2cm wide, good granulation tissue   Assessment: s/p  Patient Active Problem List   Diagnosis Date Noted  . Parastomal hernia 05/12/2013  . Hernia 03/18/2013  . Need for prophylactic vaccination and inoculation against influenza 03/18/2013  . Colostomy malfunction 12/09/2012  . Peristomal hernia 04/19/2012  . Depression   . Seborrheic dermatitis 08/09/2010  . PERSONAL HX RECTAL CANCER 06/05/2010  . SMOKER 04/16/2010  . ALLERGIC RHINITIS 04/16/2010  . UNSPECIFIED ANEMIA 12/06/2009  . 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 HYPERTENSION 12/06/2009  . FATIGUE 12/06/2009  . DIARRHEA 12/06/2009    Plan: Doing well.  F/U in 3 weeks    .Rosario Adie, [facility], [place]   06/13/2013 9:59 AM

## 2013-06-14 NOTE — Telephone Encounter (Signed)
Last office visit 6/14 and this medication is in historical but never filled by you that I see--please advise

## 2013-06-14 NOTE — Telephone Encounter (Signed)
MD is out of office. Pls advise on refill.../lmb 

## 2013-06-14 NOTE — Telephone Encounter (Signed)
Dr. Ronnald Ramp ok script & sign i have miss place. Called [facility] spoke with pharmacist [place] gave md approval.../lmb

## 2013-06-20 ENCOUNTER — Telehealth (INDEPENDENT_AMBULATORY_CARE_PROVIDER_SITE_OTHER): Payer: Self-pay

## 2013-06-20 NOTE — Telephone Encounter (Signed)
Pt called requesting refill on oxycodone. Pt states she is still very sore and this makes it difficult to sleep. Pt states no redness. No fever. No increased swelling. Pt states she is eating well and BM normal. Pt advised request will be sent to Dr Marcello Moores and her nurse for review. Pt can be reached at [phone].

## 2013-06-21 NOTE — Telephone Encounter (Signed)
[place] for one more refill, but she needs to wean off with this Rx.  Oxycodone 5mg  1 tab q6h #30

## 2013-06-22 NOTE — Telephone Encounter (Signed)
Patient calling in to check the status of her pain medication refill.  Patient advised that her refill has been approved and we will have the urgent office physician to sign the prescription and it will be after 2:30 at the front desk.  Message given to Deanna to address with Dr. Dalbert Batman

## 2013-06-25 ENCOUNTER — Emergency Department (HOSPITAL_COMMUNITY)
Admission: EM | Admit: 2013-06-25 | Discharge: 2013-06-25 | Disposition: A | Discharge status: Home / Self Care | Payer: Medicare Other | Attending: Emergency Medicine | Admitting: Emergency Medicine

## 2013-06-25 ENCOUNTER — Encounter (HOSPITAL_COMMUNITY): Payer: Self-pay | Admitting: Emergency Medicine

## 2013-06-25 DIAGNOSIS — Z860100 Personal history of colon polyps, unspecified: Secondary | ICD-10-CM | POA: Insufficient documentation

## 2013-06-25 DIAGNOSIS — Z79899 Other long term (current) drug therapy: Secondary | ICD-10-CM | POA: Insufficient documentation

## 2013-06-25 DIAGNOSIS — Z8711 Personal history of peptic ulcer disease: Secondary | ICD-10-CM | POA: Diagnosis not present

## 2013-06-25 DIAGNOSIS — G589 Mononeuropathy, unspecified: Secondary | ICD-10-CM | POA: Insufficient documentation

## 2013-06-25 DIAGNOSIS — Z4801 Encounter for change or removal of surgical wound dressing: Secondary | ICD-10-CM | POA: Diagnosis not present

## 2013-06-25 DIAGNOSIS — F3289 Other specified depressive episodes: Secondary | ICD-10-CM | POA: Diagnosis not present

## 2013-06-25 DIAGNOSIS — Z4802 Encounter for removal of sutures: Secondary | ICD-10-CM | POA: Diagnosis not present

## 2013-06-25 DIAGNOSIS — F329 Major depressive disorder, single episode, unspecified: Secondary | ICD-10-CM | POA: Diagnosis not present

## 2013-06-25 DIAGNOSIS — Z85048 Personal history of other malignant neoplasm of rectum, rectosigmoid junction, and anus: Secondary | ICD-10-CM | POA: Insufficient documentation

## 2013-06-25 DIAGNOSIS — F17200 Nicotine dependence, unspecified, uncomplicated: Secondary | ICD-10-CM | POA: Diagnosis not present

## 2013-06-25 DIAGNOSIS — Z880 Allergy status to penicillin: Secondary | ICD-10-CM | POA: Insufficient documentation

## 2013-06-25 DIAGNOSIS — Z862 Personal history of diseases of the blood and blood-forming organs and certain disorders involving the immune mechanism: Secondary | ICD-10-CM | POA: Insufficient documentation

## 2013-06-25 DIAGNOSIS — Z8719 Personal history of other diseases of the digestive system: Secondary | ICD-10-CM | POA: Insufficient documentation

## 2013-06-25 DIAGNOSIS — I10 Essential (primary) hypertension: Secondary | ICD-10-CM | POA: Insufficient documentation

## 2013-06-25 NOTE — ED Notes (Signed)
Pt presents needing sutures removed from R pinky toe.  Pt reports that sutures were placed at [facility] on 05/23/2013.

## 2013-06-25 NOTE — Discharge Instructions (Signed)
Suture Removal, Care After Refer to this sheet in the next few weeks. These instructions provide you with information on caring for yourself after your procedure. Your health care provider may also give you more specific instructions. Your treatment has been planned according to current medical practices, but problems sometimes occur. Call your health care provider if you have any problems or questions after your procedure. WHAT TO EXPECT AFTER THE PROCEDURE After your stitches (sutures) are removed, it is typical to have the following:  Some discomfort and swelling in the wound area.  Slight redness in the area. HOME CARE INSTRUCTIONS   If you have skin adhesive strips over the wound area, do not take the strips off. They will fall off on their own in a few days. If the strips remain in place after 14 days, you may remove them.  Change any bandages (dressings) at least once a day or as directed by your health care provider. If the bandage sticks, soak it off with warm, soapy water.  Apply cream or ointment only as directed by your health care provider. If using cream or ointment, wash the area with soap and water 2 times a day to remove all the cream or ointment. Rinse off the soap and pat the area dry with a clean towel.  Keep the wound area dry and clean. If the bandage becomes wet or dirty, or if it develops a bad smell, change it as soon as possible.  Continue to protect the wound from injury.  Use sunscreen when out in the sun. New scars become sunburned easily. SEEK MEDICAL CARE IF:  You have increasing redness, swelling, or pain in the wound.  You see pus coming from the wound.  You have a fever.  You notice a bad smell coming from the wound or dressing.  Your wound breaks open (edges not staying together). Document Released: 12/24/2000 Document Revised: 01/19/2013 Document Reviewed: 11/10/2012 [facility] Patient Information 2014 [place].  Scar Minimization You will  have a scar anytime you have surgery and a cut is made in the skin or you have something removed from your skin (mole, skin cancer, cyst). Although scars are unavoidable following surgery, there are ways to minimize their appearance. It is important to follow all the instructions you receive from your caregiver about wound care. How your wound heals will influence the appearance of your scar. If you do not follow the wound care instructions as directed, complications such as infection may occur. Wound instructions include keeping the wound clean, moist, and not letting the wound form a scab. Some people form scars that are raised and lumpy (hypertrophic) or larger than the initial wound (keloidal). HOME CARE INSTRUCTIONS   Follow wound care instructions as directed.  Keep the wound clean by washing it with soap and water.  Keep the wound moist with provided antibiotic cream or petroleum jelly until completely healed. Moisten twice a day for about 2 weeks.  Get stitches (sutures) taken out at the scheduled time.  Avoid touching or manipulating your wound unless needed. Wash your hands thoroughly before and after touching your wound.  Follow all restrictions such as limits on exercise or work. This depends on where your scar is located.  Keep the scar protected from sunburn. Cover the scar with sunscreen/sunblock with SPF 30 or higher.  Gently massage the scar using a circular motion to help minimize the appearance of the scar. Do this only after the wound has closed and all the sutures have  been removed.  For hypertrophic or keloidal scars, there are several ways to treat and minimize their appearance. Methods include compression therapy, intralesional corticosteroids, laser therapy, or surgery. These methods are performed by your caregiver. Remember that the scar may appear lighter or darker than your normal skin color. This difference in color should even out with time. SEEK MEDICAL CARE IF:     You have a fever.  You develop signs of infection such as pain, redness, pus, and warmth.  You have questions or concerns. Document Released: 09/18/2009 Document Revised: 06/23/2011 Document Reviewed: 09/18/2009 [facility] Patient Information 2014 [place].

## 2013-06-25 NOTE — ED Provider Notes (Signed)
CSN: [id number]     Arrival date & time 06/25/13  1508 History   First MD Initiated Contact with Patient 06/25/13 1528     This chart was scribed for non-physician practitioner, Margarita Mail, PA-C, working with Wandra Arthurs, MD by Forrestine Him, ED Scribe. This patient was seen in room WTR6/WTR6 and the patient's care was started at 4:15 PM.   Chief Complaint  Patient presents with  . Suture / Staple Removal   The history is provided by the patient. No language interpreter was used.    HPI Comments: [name] is a 58 y.o. female who presents to the Emergency Department for suture removal to the right pinky toe today. Pt states sutures were placed at [facility] ED 05/23/13. She states she planned to have her sutures removed earlier by per primary care provider, however, she states she was unable to follow up due to the weather. A this time she denies any fever, chills, or associated symptoms. No other concerns this visit.  Past Medical History  Diagnosis Date  . 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 Hypertension   . Diverticulosis   . Radiation proctitis     mild  . Adenomatous colon polyp   . Anemia, unspecified   . Depression   . Nonspecific colitis 10/10/11  . Cocaine abuse     as recently as 10/21/11  . Hernia 03/18/2013  . Neuromuscular disorder   . 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 Incontinence of bowel     since colostomy  . History of transfusion   . Difficulty sleeping     takes meds to sleep   Past Surgical History  Procedure Laterality Date  . Mouth surgery  10/2009  . Low anterior bowel resection  03/07/09  . Colostomy  2013    [facility]  . Portacath placement      and removal  . Laparoscopic parastomal hernia N/A 05/12/2013    Procedure: LAPAROSCOPIC PARASTOMAL HERNIA;  Surgeon: Leighton Ruff, MD;  Location: [facility] ORS;  Service: General;  Laterality: N/A;  . Hernia repair     Family History  Problem Relation Age of Onset  . Colon  cancer Mother   . Stomach cancer Sister   . Colon polyps Sister   . Diabetes Father   . Cirrhosis Brother   . Pulmonary Hypertension Brother    History  Substance Use Topics  . Smoking status: Current Every Day Smoker -- 40 years    Types: Cigarettes  . Smokeless tobacco: Never Used     Comment: form given 04-15-12.divorced, lives with sister Willaim Rayas. Food Service at [employer]  . Alcohol Use: Yes     Comment: occasional  driink   OB History   Grav Para Term Preterm Abortions TAB SAB Ect Mult Living                 Review of Systems  Constitutional: Negative for fever and chills.  HENT: Negative for congestion.   Eyes: Negative for redness.  Respiratory: Negative for cough.   Skin: Positive for wound.      Allergies  Ampicillin; Hydrocodone; and Penicillins  Home Medications   Current Outpatient Rx  Name  Route  Sig  Dispense  Refill  . acetaminophen (TYLENOL) 500 MG tablet   Oral   Take 1,000 mg by mouth every 4 (four) hours as needed for mild pain or headache.         [name]  ALPRAZolam (XANAX XR) 0.5 MG 24 hr tablet   Oral   Take 0.5 mg by mouth at bedtime as needed for anxiety or sleep.         [name] amitriptyline (ELAVIL) 25 MG tablet   Oral   Take 25 mg by mouth at bedtime.          . citalopram (CELEXA) 20 MG tablet   Oral   Take 20 mg by mouth every morning.         . gabapentin (NEURONTIN) 300 MG capsule   Oral   Take 600-900 mg by mouth 3 (three) times daily. 3 capsules in the morning, 2 capsules at noon and 3 capsules at bedtime.         . hydrochlorothiazide (HYDRODIURIL) 25 MG tablet   Oral   Take 25 mg by mouth every morning.         [name] OVER THE COUNTER MEDICATION   Oral   Take 2 tablets by mouth every 4 (four) hours as needed (pain (percogesic)). Med has aspirin and tylenol         . OxyCODONE HCl, Abuse Deter, 5 MG TABA   Oral   Take 1 tablet by mouth every 6 (six) hours as needed (pain).          Triage  Vitals: BP 149/80  Pulse 61  Temp(Src) 98 F (36.7 C) (Oral)  Resp 16  SpO2 99%   Physical Exam  Nursing note and vitals reviewed. Constitutional: She is oriented to person, place, and time. She appears well-developed and well-nourished.  HENT:  Head: Normocephalic and atraumatic.  Eyes: EOM are normal.  Neck: Normal range of motion.  Cardiovascular: Normal rate.   Pulmonary/Chest: Effort normal.  Musculoskeletal: Normal range of motion.  Neurological: She is alert and oriented to person, place, and time.  Skin: Skin is warm and dry.  Psychiatric: She has a normal mood and affect. Her behavior is normal.    ED Course  Procedures (including critical care time)  DIAGNOSTIC STUDIES: Oxygen Saturation is 99% on RA, Normal by my interpretation.    COORDINATION OF CARE: 4:21 PM- Will remove sutures. Discussed treatment plan with pt at bedside and pt agreed to plan.     4:22 PM-   Labs Review Labs Reviewed - No data to display Imaging Review No results found.   EKG Interpretation None      MDM   Final diagnoses:  Visit for suture removal    Staple removal   Pt to ER for staple/suture removal and wound check as above. Procedure tolerated well. Vitals normal, no signs of infection. Scar minimization & return precautions given at dc.   I personally performed the services described in this documentation, which was scribed in my presence. The recorded information has been reviewed and is accurate.    Margarita Mail, PA-C 06/25/13 1928

## 2013-06-25 NOTE — ED Provider Notes (Signed)
Medical screening examination/treatment/procedure(s) were performed by non-physician practitioner and as supervising physician I was immediately available for consultation/collaboration.   EKG Interpretation None        Wandra Arthurs, MD 06/25/13 2322

## 2013-06-28 DIAGNOSIS — Z85048 Personal history of other malignant neoplasm of rectum, rectosigmoid junction, and anus: Secondary | ICD-10-CM | POA: Diagnosis not present

## 2013-06-28 DIAGNOSIS — IMO0002 Reserved for concepts with insufficient information to code with codable children: Secondary | ICD-10-CM | POA: Diagnosis not present

## 2013-06-28 DIAGNOSIS — I10 Essential (primary) hypertension: Secondary | ICD-10-CM | POA: Diagnosis not present

## 2013-06-28 DIAGNOSIS — D649 Anemia, unspecified: Secondary | ICD-10-CM | POA: Diagnosis not present

## 2013-06-28 DIAGNOSIS — Z48815 Encounter for surgical aftercare following surgery on the digestive system: Secondary | ICD-10-CM | POA: Diagnosis not present

## 2013-06-28 DIAGNOSIS — Z933 Colostomy status: Secondary | ICD-10-CM | POA: Diagnosis not present

## 2013-07-05 ENCOUNTER — Ambulatory Visit (INDEPENDENT_AMBULATORY_CARE_PROVIDER_SITE_OTHER): Payer: Medicare Other | Admitting: General Surgery

## 2013-07-05 ENCOUNTER — Encounter (INDEPENDENT_AMBULATORY_CARE_PROVIDER_SITE_OTHER): Payer: Self-pay | Admitting: General Surgery

## 2013-07-05 VITALS — BP 126/70 | HR 74 | Temp 97.4°F | Ht 66.0 in | Wt 170.0 lb

## 2013-07-05 DIAGNOSIS — Z9889 Other specified postprocedural states: Secondary | ICD-10-CM

## 2013-07-05 MED ORDER — OXYCODONE HCL 5 MG PO TABA: 1.0000 | ORAL_TABLET | Freq: Four times a day (QID) | ORAL | Status: DC | PRN

## 2013-07-05 NOTE — Progress Notes (Signed)
[name] is a 58 y.o. female who is status post a parastomal hernia repair. She is doing well. Her wound is closing. She is packing it daily.  She is having some sharp left upper quadrant pain with movement. Objective:  Filed Vitals:   07/05/13 0948  BP: 126/70  Pulse: 74  Temp: 97.4 F (36.3 C)    General appearance: alert and cooperative  GI: normal findings: soft, non-tender  Incision: healing well, ~3cm deep and 0.5cm wide, good granulation tissue  Assessment:  s/p  Patient Active Problem List    Diagnosis  Date Noted   .  Parastomal hernia  05/12/2013   .  Hernia  03/18/2013   .  Need for prophylactic vaccination and inoculation against influenza  03/18/2013   .  Colostomy malfunction  12/09/2012   .  Peristomal hernia  04/19/2012   .  Depression    .  Seborrheic dermatitis  08/09/2010   .  PERSONAL HX RECTAL CANCER  06/05/2010   .  SMOKER  04/16/2010   .  ALLERGIC RHINITIS  04/16/2010   .  UNSPECIFIED ANEMIA  12/06/2009   .  Peripheral neuropathy, toxic  12/06/2009   .  HYPERTENSION  12/06/2009   .  FATIGUE  12/06/2009   .  DIARRHEA  12/06/2009   Plan:  Doing well. F/U in 4 weeks.  I think her pain is mostly due to the surgical tacks used for the hernia repair. I've recommended that she try some gentle massage to the areas that seemed to be giving her the most pain. I have refilled her narcotics one last time. I will like to wean her off of these at this point. I suspect she may have some chronic left upper quadrant pain which hopefully will get better with time. If not we will send her to a pain specialist to evaluate further.  Rosario Adie, [facility], [place]

## 2013-07-05 NOTE — Patient Instructions (Signed)
Try to wean off of the pain medication. Okay to return to full activity. May use gentle massage on areas of frequent pain. Return to the office in 4 weeks.

## 2013-07-06 DIAGNOSIS — Z933 Colostomy status: Secondary | ICD-10-CM | POA: Diagnosis not present

## 2013-07-06 DIAGNOSIS — Z85048 Personal history of other malignant neoplasm of rectum, rectosigmoid junction, and anus: Secondary | ICD-10-CM | POA: Diagnosis not present

## 2013-07-06 DIAGNOSIS — IMO0002 Reserved for concepts with insufficient information to code with codable children: Secondary | ICD-10-CM | POA: Diagnosis not present

## 2013-07-06 DIAGNOSIS — D649 Anemia, unspecified: Secondary | ICD-10-CM | POA: Diagnosis not present

## 2013-07-06 DIAGNOSIS — I10 Essential (primary) hypertension: Secondary | ICD-10-CM | POA: Diagnosis not present

## 2013-07-06 DIAGNOSIS — Z48815 Encounter for surgical aftercare following surgery on the digestive system: Secondary | ICD-10-CM | POA: Diagnosis not present

## 2013-07-11 ENCOUNTER — Other Ambulatory Visit: Payer: Self-pay | Admitting: Internal Medicine

## 2013-07-13 DIAGNOSIS — D649 Anemia, unspecified: Secondary | ICD-10-CM | POA: Diagnosis not present

## 2013-07-13 DIAGNOSIS — Z933 Colostomy status: Secondary | ICD-10-CM | POA: Diagnosis not present

## 2013-07-13 DIAGNOSIS — IMO0002 Reserved for concepts with insufficient information to code with codable children: Secondary | ICD-10-CM | POA: Diagnosis not present

## 2013-07-13 DIAGNOSIS — Z85048 Personal history of other malignant neoplasm of rectum, rectosigmoid junction, and anus: Secondary | ICD-10-CM | POA: Diagnosis not present

## 2013-07-13 DIAGNOSIS — Z48815 Encounter for surgical aftercare following surgery on the digestive system: Secondary | ICD-10-CM | POA: Diagnosis not present

## 2013-07-13 DIAGNOSIS — I10 Essential (primary) hypertension: Secondary | ICD-10-CM | POA: Diagnosis not present

## 2013-07-20 ENCOUNTER — Telehealth: Payer: Self-pay | Admitting: Internal Medicine

## 2013-07-20 ENCOUNTER — Encounter: Payer: Self-pay | Admitting: Internal Medicine

## 2013-07-20 ENCOUNTER — Ambulatory Visit (INDEPENDENT_AMBULATORY_CARE_PROVIDER_SITE_OTHER): Payer: Medicare Other | Admitting: Internal Medicine

## 2013-07-20 VITALS — BP 128/70 | HR 60 | Temp 98.6°F | Wt 168.0 lb

## 2013-07-20 DIAGNOSIS — Z0000 Encounter for general adult medical examination without abnormal findings: Secondary | ICD-10-CM | POA: Diagnosis not present

## 2013-07-20 DIAGNOSIS — F3289 Other specified depressive episodes: Secondary | ICD-10-CM

## 2013-07-20 DIAGNOSIS — I10 Essential (primary) hypertension: Secondary | ICD-10-CM | POA: Diagnosis not present

## 2013-07-20 DIAGNOSIS — F32A Depression, unspecified: Secondary | ICD-10-CM

## 2013-07-20 DIAGNOSIS — Z1239 Encounter for other screening for malignant neoplasm of breast: Secondary | ICD-10-CM

## 2013-07-20 DIAGNOSIS — F329 Major depressive disorder, single episode, unspecified: Secondary | ICD-10-CM | POA: Diagnosis not present

## 2013-07-20 DIAGNOSIS — Z124 Encounter for screening for malignant neoplasm of cervix: Secondary | ICD-10-CM

## 2013-07-20 NOTE — Telephone Encounter (Signed)
Relevant patient education mailed to patient.  
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
## 2013-07-20 NOTE — Progress Notes (Signed)
Pre visit review using our clinic review tool, if applicable. No additional management support is needed unless otherwise documented below in the visit note. 
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
## 2013-07-20 NOTE — Patient Instructions (Addendum)
It was good to see you today.  We have reviewed your prior records including labs and tests today  Health Maintenance reviewed - all recommended immunizations and age-appropriate screenings are up-to-date.  we'll make referral to or Pap and pelvic. Also for mammogram screening . Our office will contact you regarding appointment(s) once made.  Test(s) ordered today. Returned and your fasting. Your results will be released to [facility] (or called to you) after review, usually within 72hours after test completion. If any changes need to be made, you will be notified at that same time.  Medications reviewed and updated, no changes recommended at this time. Refill on medication(s) as discussed today.  Please schedule followup in 6 months for semiannual exam and labs, call sooner if problems.  Health Maintenance, Female A healthy lifestyle and preventative care can promote health and wellness.  Maintain regular health, dental, and eye exams.  Eat a healthy diet. Foods like vegetables, fruits, whole grains, low-fat dairy products, and lean protein foods contain the nutrients you need without too many calories. Decrease your intake of foods high in solid fats, added sugars, and salt. Get information about a proper diet from your caregiver, if necessary.  Regular physical exercise is one of the most important things you can do for your health. Most adults should get at least 150 minutes of moderate-intensity exercise (any activity that increases your heart rate and causes you to sweat) each week. In addition, most adults need muscle-strengthening exercises on 2 or more days a week.   Maintain a healthy weight. The body mass index (BMI) is a screening tool to identify possible weight problems. It provides an estimate of body fat based on height and weight. Your caregiver can help determine your BMI, and can help you achieve or maintain a healthy weight. For adults 20 years and older:  A BMI below 18.5  is considered underweight.  A BMI of 18.5 to 24.9 is normal.  A BMI of 25 to 29.9 is considered overweight.  A BMI of 30 and above is considered obese.  Maintain normal blood lipids and cholesterol by exercising and minimizing your intake of saturated fat. Eat a balanced diet with plenty of fruits and vegetables. Blood tests for lipids and cholesterol should begin at age 67 and be repeated every 5 years. If your lipid or cholesterol levels are high, you are over 50, or you are a high risk for heart disease, you may need your cholesterol levels checked more frequently.Ongoing high lipid and cholesterol levels should be treated with medicines if diet and exercise are not effective.  If you smoke, find out from your caregiver how to quit. If you do not use tobacco, do not start.  Lung cancer screening is recommended for adults aged 40 80 years who are at high risk for developing lung cancer because of a history of smoking. Yearly low-dose computed tomography (CT) is recommended for people who have at least a 30-pack-year history of smoking and are a current smoker or have quit within the past 15 years. A pack year of smoking is smoking an average of 1 pack of cigarettes a day for 1 year (for example: 1 pack a day for 30 years or 2 packs a day for 15 years). Yearly screening should continue until the smoker has stopped smoking for at least 15 years. Yearly screening should also be stopped for people who develop a health problem that would prevent them from having lung cancer treatment.  If you are pregnant,  do not drink alcohol. If you are breastfeeding, be very cautious about drinking alcohol. If you are not pregnant and choose to drink alcohol, do not exceed 1 drink per day. One drink is considered to be 12 ounces (355 mL) of beer, 5 ounces (148 mL) of wine, or 1.5 ounces (44 mL) of liquor.  Avoid use of street drugs. Do not share needles with anyone. Ask for help if you need support or instructions  about stopping the use of drugs.  High blood pressure causes heart disease and increases the risk of stroke. Blood pressure should be checked at least every 1 to 2 years. Ongoing high blood pressure should be treated with medicines, if weight loss and exercise are not effective.  If you are 73 to 57 years old, ask your caregiver if you should take aspirin to prevent strokes.  Diabetes screening involves taking a blood sample to check your fasting blood sugar level. This should be done once every 3 years, after age 31, if you are within normal weight and without risk factors for diabetes. Testing should be considered at a younger age or be carried out more frequently if you are overweight and have at least 1 risk factor for diabetes.  Breast cancer screening is essential preventative care for women. You should practice "breast self-awareness." This means understanding the normal appearance and feel of your breasts and may include breast self-examination. Any changes detected, no matter how small, should be reported to a caregiver. Women in their 39s and 30s should have a clinical breast exam (CBE) by a caregiver as part of a regular health exam every 1 to 3 years. After age 7, women should have a CBE every year. Starting at age 28, women should consider having a mammogram (breast X-ray) every year. Women who have a family history of breast cancer should talk to their caregiver about genetic screening. Women at a high risk of breast cancer should talk to their caregiver about having an MRI and a mammogram every year.  Breast cancer gene (BRCA)-related cancer risk assessment is recommended for women who have family members with BRCA-related cancers. BRCA-related cancers include breast, ovarian, tubal, and peritoneal cancers. Having family members with these cancers may be associated with an increased risk for harmful changes (mutations) in the breast cancer genes BRCA1 and BRCA2. Results of the assessment  will determine the need for genetic counseling and BRCA1 and BRCA2 testing.  The Pap test is a screening test for cervical cancer. Women should have a Pap test starting at age 14. Between ages 7 and 49, Pap tests should be repeated every 2 years. Beginning at age 87, you should have a Pap test every 3 years as long as the past 3 Pap tests have been normal. If you had a hysterectomy for a problem that was not cancer or a condition that could lead to cancer, then you no longer need Pap tests. If you are between ages 99 and 17, and you have had normal Pap tests going back 10 years, you no longer need Pap tests. If you have had past treatment for cervical cancer or a condition that could lead to cancer, you need Pap tests and screening for cancer for at least 20 years after your treatment. If Pap tests have been discontinued, risk factors (such as a new sexual partner) need to be reassessed to determine if screening should be resumed. Some women have medical problems that increase the chance of getting cervical cancer. In these cases,  your caregiver may recommend more frequent screening and Pap tests.  The human papillomavirus (HPV) test is an additional test that may be used for cervical cancer screening. The HPV test looks for the virus that can cause the cell changes on the cervix. The cells collected during the Pap test can be tested for HPV. The HPV test could be used to screen women aged 36 years and older, and should be used in women of any age who have unclear Pap test results. After the age of 13, women should have HPV testing at the same frequency as a Pap test.  Colorectal cancer can be detected and often prevented. Most routine colorectal cancer screening begins at the age of 96 and continues through age 53. However, your caregiver may recommend screening at an earlier age if you have risk factors for colon cancer. On a yearly basis, your caregiver may provide home test kits to check for hidden blood  in the stool. Use of a small camera at the end of a tube, to directly examine the colon (sigmoidoscopy or colonoscopy), can detect the earliest forms of colorectal cancer. Talk to your caregiver about this at age 14, when routine screening begins. Direct examination of the colon should be repeated every 5 to 10 years through age 38, unless early forms of pre-cancerous polyps or small growths are found.  Hepatitis C blood testing is recommended for all people born from [birth year] through 1964 and any individual with known risks for hepatitis C.  Practice safe sex. Use condoms and avoid high-risk sexual practices to reduce the spread of sexually transmitted infections (STIs). Sexually active women aged 44 and younger should be checked for Chlamydia, which is a common sexually transmitted infection. Older women with new or multiple partners should also be tested for Chlamydia. Testing for other STIs is recommended if you are sexually active and at increased risk.  Osteoporosis is a disease in which the bones lose minerals and strength with aging. This can result in serious bone fractures. The risk of osteoporosis can be identified using a bone density scan. Women ages 5 and over and women at risk for fractures or osteoporosis should discuss screening with their caregivers. Ask your caregiver whether you should be taking a calcium supplement or vitamin D to reduce the rate of osteoporosis.  Menopause can be associated with physical symptoms and risks. Hormone replacement therapy is available to decrease symptoms and risks. You should talk to your caregiver about whether hormone replacement therapy is right for you.  Use sunscreen. Apply sunscreen liberally and repeatedly throughout the day. You should seek shade when your shadow is shorter than you. Protect yourself by wearing long sleeves, pants, a wide-brimmed hat, and sunglasses year round, whenever you are outdoors.  Notify your caregiver of new moles or  changes in moles, especially if there is a change in shape or color. Also notify your caregiver if a mole is larger than the size of a pencil eraser.  Stay current with your immunizations. Document Released: 10/14/2010 Document Revised: 07/26/2012 Document Reviewed: 10/14/2010 [facility] Patient Information 2014 [place].

## 2013-07-20 NOTE — Assessment & Plan Note (Signed)
The current medical regimen is effective;  continue present plan and medications. BP Readings from Last 3 Encounters:  07/20/13 128/70  07/05/13 126/70  06/25/13 149/80

## 2013-08-03 ENCOUNTER — Ambulatory Visit (HOSPITAL_COMMUNITY): Payer: Medicare Other

## 2013-08-04 ENCOUNTER — Encounter (INDEPENDENT_AMBULATORY_CARE_PROVIDER_SITE_OTHER): Payer: Medicare Other | Admitting: General Surgery

## 2013-08-11 ENCOUNTER — Other Ambulatory Visit: Payer: Self-pay | Admitting: Internal Medicine

## 2013-08-15 ENCOUNTER — Ambulatory Visit (HOSPITAL_COMMUNITY)
Admission: RE | Admit: 2013-08-15 | Discharge: 2013-08-15 | Disposition: A | Discharge status: Home / Self Care | Payer: Medicare Other | Source: Ambulatory Visit | Attending: Internal Medicine | Admitting: Internal Medicine

## 2013-08-15 ENCOUNTER — Encounter (INDEPENDENT_AMBULATORY_CARE_PROVIDER_SITE_OTHER): Payer: Self-pay

## 2013-08-15 DIAGNOSIS — Z1231 Encounter for screening mammogram for malignant neoplasm of breast: Secondary | ICD-10-CM | POA: Insufficient documentation

## 2013-08-15 DIAGNOSIS — Z1239 Encounter for other screening for malignant neoplasm of breast: Secondary | ICD-10-CM

## 2013-08-16 ENCOUNTER — Encounter (INDEPENDENT_AMBULATORY_CARE_PROVIDER_SITE_OTHER): Payer: Self-pay | Admitting: General Surgery

## 2013-08-16 ENCOUNTER — Ambulatory Visit (INDEPENDENT_AMBULATORY_CARE_PROVIDER_SITE_OTHER): Payer: Medicare Other | Admitting: General Surgery

## 2013-08-16 VITALS — BP 124/80 | HR 72 | Temp 97.2°F | Resp 16 | Ht 66.0 in | Wt 170.2 lb

## 2013-08-16 DIAGNOSIS — Z9889 Other specified postprocedural states: Secondary | ICD-10-CM

## 2013-08-16 NOTE — Progress Notes (Signed)
[name] is a 58 y.o. female who is status post a parastomal hernia repair. She is doing well. Her wound is closed now.  She is having some left upper quadrant soreness with movement. She is off her narcotics. Objective:  Filed Vitals:   08/16/13 1226  BP: 124/80  Pulse: 72  Temp: 97.2 F (36.2 C)  Resp: 16   General appearance: alert and cooperative  GI: normal findings: soft, non-tender  Ostomy intact. No prolapse. She does have some small non-bleeding polyps noted on the colon mucosa which appear to be traction-related. Incision: healing well  Assessment:  s/p  Patient Active Problem List    Diagnosis  Date Noted   .  Parastomal hernia  05/12/2013   .  Hernia  03/18/2013   .  Need for prophylactic vaccination and inoculation against influenza  03/18/2013   .  Colostomy malfunction  12/09/2012   .  Peristomal hernia  04/19/2012   .  Depression    .  Seborrheic dermatitis  08/09/2010   .  PERSONAL HX RECTAL CANCER  06/05/2010   .  SMOKER  04/16/2010   .  ALLERGIC RHINITIS  04/16/2010   .  UNSPECIFIED ANEMIA  12/06/2009   .  Peripheral neuropathy, toxic  12/06/2009   .  HYPERTENSION  12/06/2009   .  FATIGUE  12/06/2009   .  DIARRHEA  12/06/2009   Plan:  Doing well. I think her soreness is mostly due to the surgical tacks used for the hernia repair.   I suspect she may have some chronic left upper quadrant pain which hopefully will get better with time.  She is scheduled to see Dr. Maurene Capes shortly. I will see her back on an as-needed basis.  Rosario Adie, MD  [facility], [place]

## 2013-08-16 NOTE — Patient Instructions (Signed)
Return to the office as needed 

## 2013-09-07 IMAGING — CT CT CHEST W/ CM
2 of 5 series · 12 of 36 positions shown, 19 images · IV contrast (READICAT/WATER & [ID] OMNI 300)
Comparison: Prior examinations 11/13/2010 and 11/27/2009.

CT CHEST

CLINICAL DATA: History of rectal cancer status post surgery,
chemotherapy and radiation therapy in 1020.  Cough with melena and
rectal pain.

CT CHEST, ABDOMEN AND PELVIS WITH CONTRAST
TECHNIQUE: Multidetector CT imaging of the chest, abdomen and
pelvis was performed following the standard protocol during bolus
administration of intravenous contrast.
Contrast: 100mL OMNIPAQUE IOHEXOL 300 MG/ML IV SOLN

[Series 601: coronal body · coronal · 1.24mm/px · 1 of 124 slices shown, 2 images]
[im 42/124  soft-tissue]
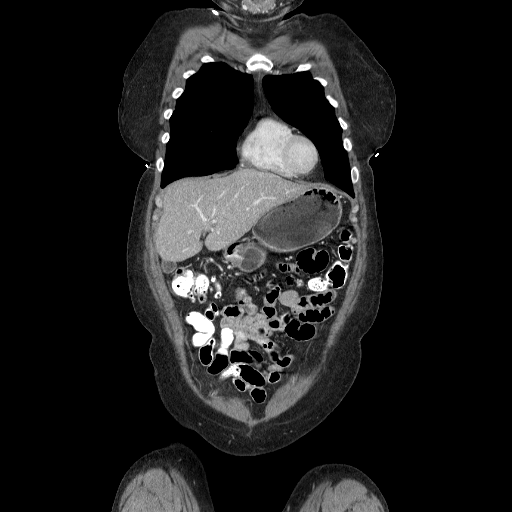
[im 42/124  bone]
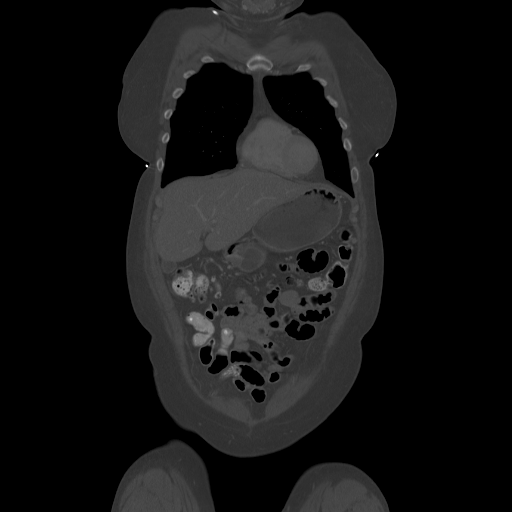

[Series 602: sagittal body · sagittal · 1.24mm/px · 11 of 153 slices shown, 17 images]
[im 12/153  soft-tissue]
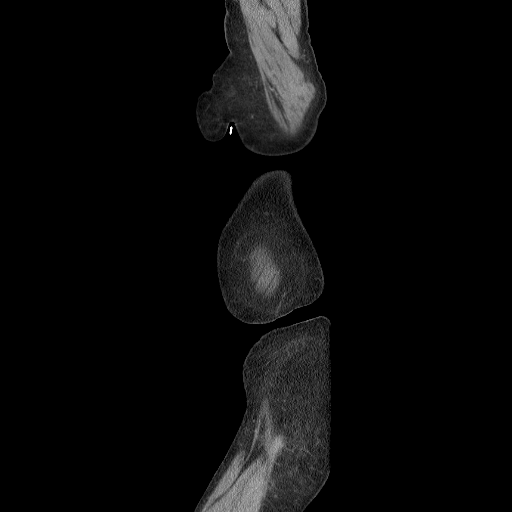
[im 12/153  lung]
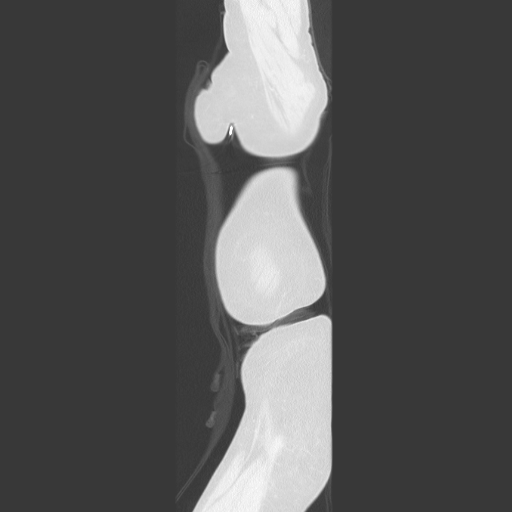
[im 12/153  bone]
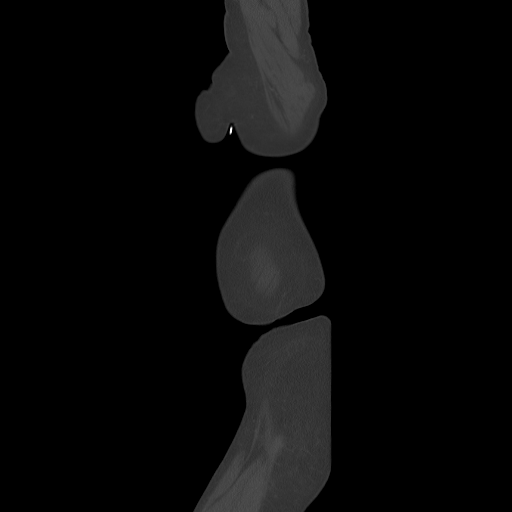
[im 24/153  soft-tissue]
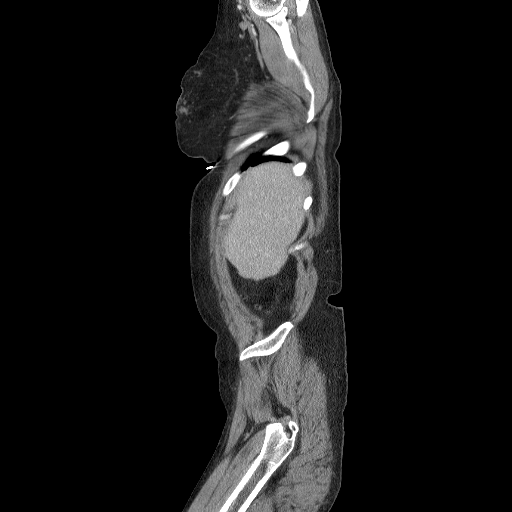
[im 24/153  lung]
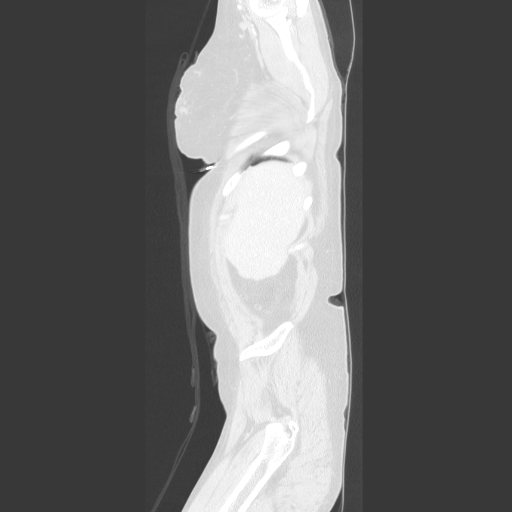
[im 36/153  soft-tissue]
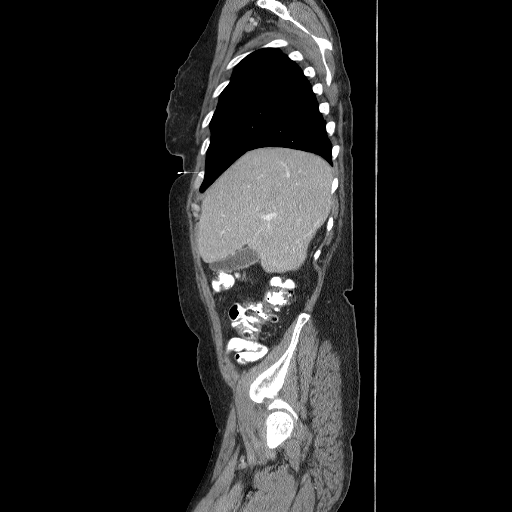
[im 36/153  lung]
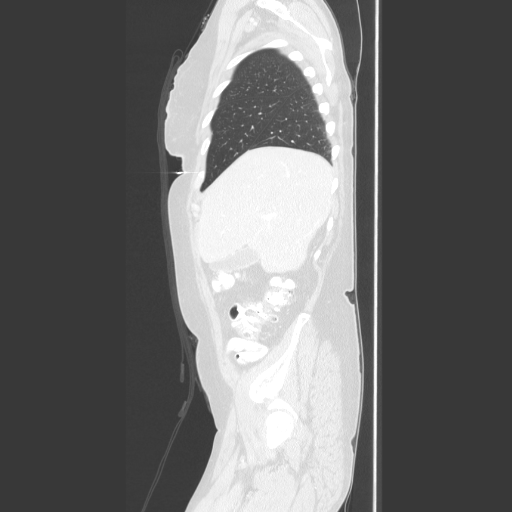
[im 47/153  soft-tissue]
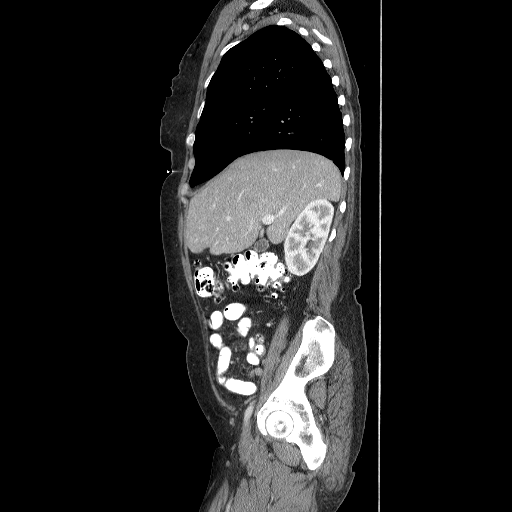
[im 47/153  lung]
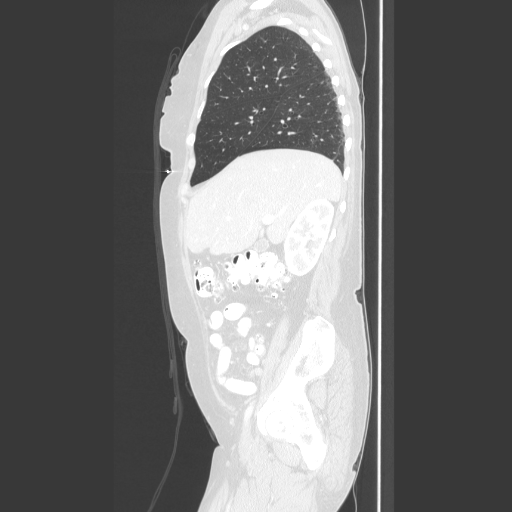
[im 59/153  soft-tissue]
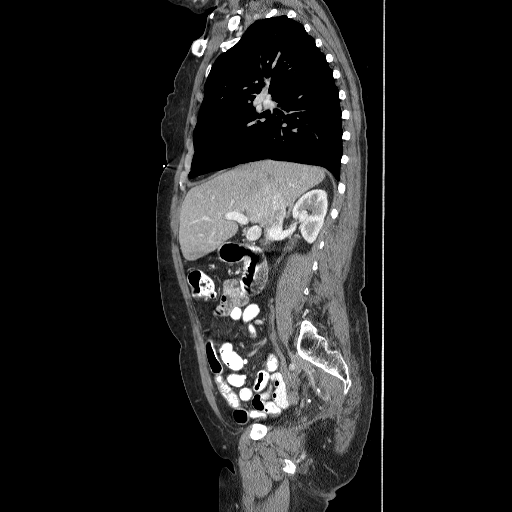
[im 82/153  soft-tissue]
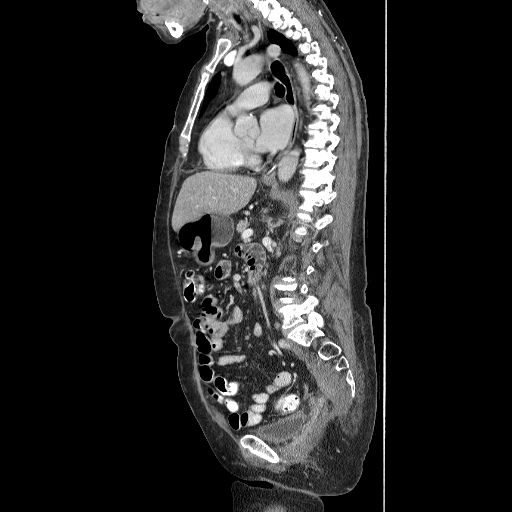
[im 94/153  soft-tissue]
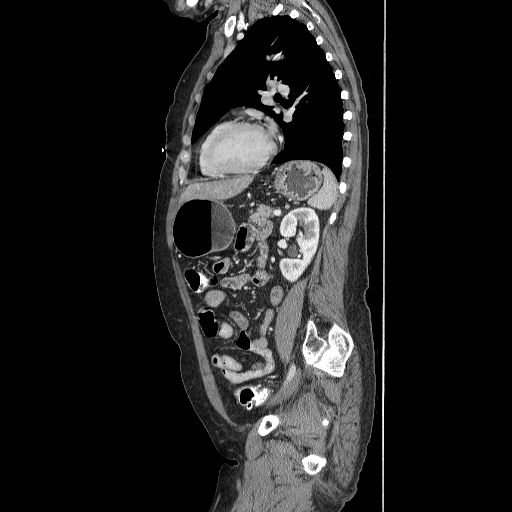
[im 106/153  soft-tissue]
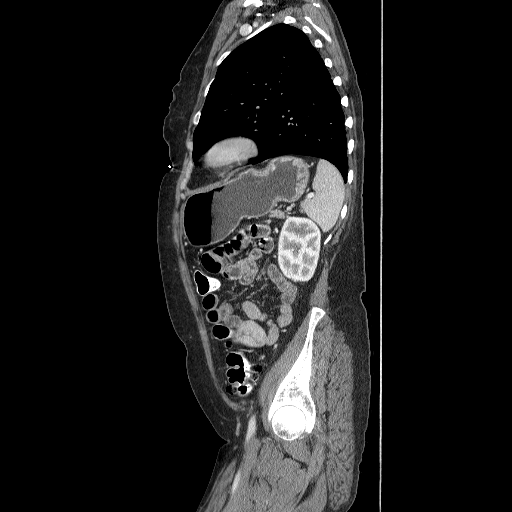
[im 117/153  soft-tissue]
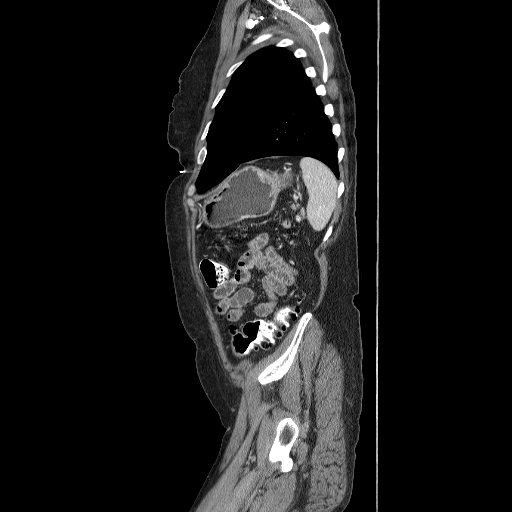
[im 117/153  bone]
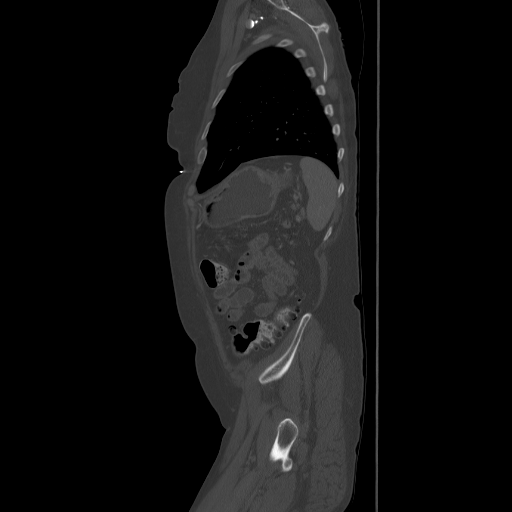
[im 129/153  soft-tissue]
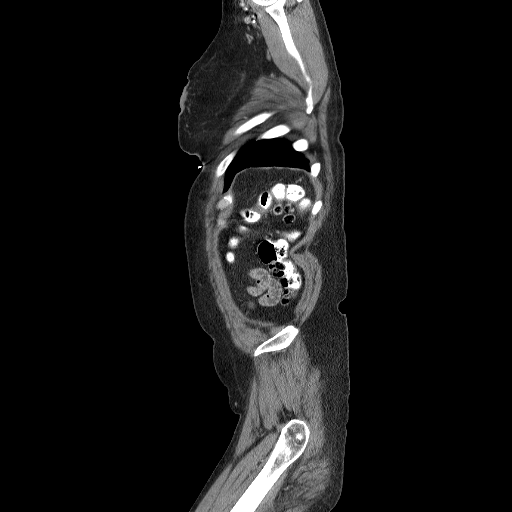
[im 141/153  soft-tissue]
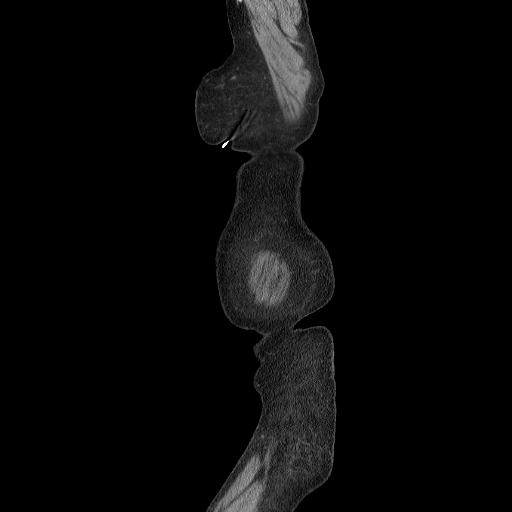

[12 of 36 positions shown; findings below may reference images not displayed]

FINDINGS: Right IJ Port-A-Cath tip is unchanged.  There is stable
minimal thyroid nodularity.  No enlarged mediastinal or hilar lymph
nodes are demonstrated.  Vascular structures are stable.  The
patient has an aberrant right subclavian artery.

The lungs are clear.  There is no pleural or pericardial effusion.
There are no suspicious osseous findings.
IMPRESSION: Stable chest CT.  No acute findings or evidence of metastatic
disease.

CT ABDOMEN AND PELVIS
FINDINGS: The liver has a stable appearance without evidence of
metastatic disease.  The gallbladder, biliary system, pancreas and
spleen appear normal.  The adrenal glands and kidneys appear
normal.

There are scattered diverticular changes of the colon without
surrounding inflammation.  The appendix appears normal.  The
colorectal anastomosis appears stable.  There is stable presacral
fibrosis with a 1.6 cm low density lesion on image 98.  There is no
adnexal mass or pelvic lymphadenopathy.  Urinary bladder appears
normal.

Lower lumbar spine degenerative changes are stable.  There are no
suspicious osseous findings.
IMPRESSION: 1.  Stable postsurgical changes in the pelvis with presacral
fibrosis.
2.  No evidence of local recurrence or metastatic disease.
3.  No acute findings identified.

## 2013-09-09 ENCOUNTER — Encounter (HOSPITAL_COMMUNITY): Payer: Self-pay

## 2013-09-09 ENCOUNTER — Other Ambulatory Visit (HOSPITAL_BASED_OUTPATIENT_CLINIC_OR_DEPARTMENT_OTHER): Payer: Medicare Other

## 2013-09-09 ENCOUNTER — Ambulatory Visit (HOSPITAL_COMMUNITY)
Admission: RE | Admit: 2013-09-09 | Discharge: 2013-09-09 | Disposition: A | Discharge status: Home / Self Care | Payer: Medicare Other | Source: Ambulatory Visit | Attending: Hematology and Oncology | Admitting: Hematology and Oncology

## 2013-09-09 DIAGNOSIS — K5730 Diverticulosis of large intestine without perforation or abscess without bleeding: Secondary | ICD-10-CM | POA: Diagnosis not present

## 2013-09-09 DIAGNOSIS — C19 Malignant neoplasm of rectosigmoid junction: Secondary | ICD-10-CM

## 2013-09-09 DIAGNOSIS — Z933 Colostomy status: Secondary | ICD-10-CM | POA: Diagnosis not present

## 2013-09-09 DIAGNOSIS — I700 Atherosclerosis of aorta: Secondary | ICD-10-CM | POA: Insufficient documentation

## 2013-09-09 DIAGNOSIS — I2510 Atherosclerotic heart disease of native coronary artery without angina pectoris: Secondary | ICD-10-CM | POA: Insufficient documentation

## 2013-09-09 DIAGNOSIS — F17200 Nicotine dependence, unspecified, uncomplicated: Secondary | ICD-10-CM

## 2013-09-09 DIAGNOSIS — M5137 Other intervertebral disc degeneration, lumbosacral region: Secondary | ICD-10-CM | POA: Insufficient documentation

## 2013-09-09 DIAGNOSIS — Z85048 Personal history of other malignant neoplasm of rectum, rectosigmoid junction, and anus: Secondary | ICD-10-CM | POA: Diagnosis not present

## 2013-09-09 DIAGNOSIS — C20 Malignant neoplasm of rectum: Secondary | ICD-10-CM | POA: Diagnosis not present

## 2013-09-09 DIAGNOSIS — M51379 Other intervertebral disc degeneration, lumbosacral region without mention of lumbar back pain or lower extremity pain: Secondary | ICD-10-CM | POA: Insufficient documentation

## 2013-09-09 LAB — CBC WITH DIFFERENTIAL/PLATELET
BASO%: 0.6 % (ref 0.0–2.0)
Basophils Absolute: 0.1 10*3/uL (ref 0.0–0.1)
EOS%: 1.2 % (ref 0.0–7.0)
Eosinophils Absolute: 0.1 10*3/uL (ref 0.0–0.5)
HCT: 41.1 % (ref 34.8–46.6)
HGB: 13.2 g/dL (ref 11.6–15.9)
LYMPH%: 17.9 % (ref 14.0–49.7)
MCH: 27.7 pg (ref 25.1–34.0)
MCHC: 32.1 g/dL (ref 31.5–36.0)
MCV: 86.2 fL (ref 79.5–101.0)
MONO#: 0.7 10*3/uL (ref 0.1–0.9)
MONO%: 7.1 % (ref 0.0–14.0)
NEUT#: 7.4 10*3/uL — ABNORMAL HIGH (ref 1.5–6.5)
NEUT%: 73.2 % (ref 38.4–76.8)
Platelets: 264 10*3/uL (ref 145–400)
RBC: 4.77 10*6/uL (ref 3.70–5.45)
RDW: 17.8 % — ABNORMAL HIGH (ref 11.2–14.5)
WBC: 10 10*3/uL (ref 3.9–10.3)
lymph#: 1.8 10*3/uL (ref 0.9–3.3)

## 2013-09-09 LAB — COMPREHENSIVE METABOLIC PANEL (CC13)
ALT: 28 U/L (ref 0–55)
AST: 26 U/L (ref 5–34)
Albumin: 3.7 g/dL (ref 3.5–5.0)
Alkaline Phosphatase: 95 U/L (ref 40–150)
Anion Gap: 15 mEq/L — ABNORMAL HIGH (ref 3–11)
BUN: 18.2 mg/dL (ref 7.0–26.0)
CO2: 21 mEq/L — ABNORMAL LOW (ref 22–29)
Calcium: 9.4 mg/dL (ref 8.4–10.4)
Chloride: 106 mEq/L (ref 98–109)
Creatinine: 0.8 mg/dL (ref 0.6–1.1)
Glucose: 98 mg/dl (ref 70–140)
Potassium: 4.1 mEq/L (ref 3.5–5.1)
Sodium: 142 mEq/L (ref 136–145)
Total Bilirubin: 0.48 mg/dL (ref 0.20–1.20)
Total Protein: 7 g/dL (ref 6.4–8.3)

## 2013-09-09 LAB — CEA: CEA: 0.9 ng/mL (ref 0.0–5.0)

## 2013-09-09 MED ORDER — IOHEXOL 300 MG/ML  SOLN
100.0000 mL | Freq: Once | INTRAMUSCULAR | Status: AC | PRN
  Administered 2013-09-09: 100 mL via INTRAVENOUS

## 2013-09-14 ENCOUNTER — Other Ambulatory Visit: Payer: Self-pay | Admitting: Internal Medicine

## 2013-09-14 ENCOUNTER — Telehealth: Payer: Self-pay | Admitting: Internal Medicine

## 2013-09-14 MED ORDER — ALPRAZOLAM 0.5 MG PO TABS: 0.5000 mg | ORAL_TABLET | Freq: Every evening | ORAL | Status: DC | PRN

## 2013-09-14 MED ORDER — AMITRIPTYLINE HCL 25 MG PO TABS: 25.0000 mg | ORAL_TABLET | Freq: Every evening | ORAL | Status: DC | PRN

## 2013-09-14 MED ORDER — GABAPENTIN 400 MG PO CAPS: 800.0000 mg | ORAL_CAPSULE | Freq: Three times a day (TID) | ORAL | Status: DC

## 2013-09-14 NOTE — Telephone Encounter (Signed)
Ok done

## 2013-09-14 NOTE — Telephone Encounter (Signed)
Pt request, gabapentin 400 mg for 180 tab(Dr. leschber suppose to increase the dosage since last ov), Xanax, amitriptyline and citalopram to be send into [facility] on [address]. Please advise.   [phone]

## 2013-09-14 NOTE — Telephone Encounter (Signed)
Notified pt meds has been sent to [facility]...[name]

## 2013-09-15 ENCOUNTER — Telehealth: Payer: Self-pay | Admitting: *Deleted

## 2013-09-15 MED ORDER — CITALOPRAM HYDROBROMIDE 20 MG PO TABS: 20.0000 mg | ORAL_TABLET | Freq: Every morning | ORAL | Status: DC

## 2013-09-15 NOTE — Telephone Encounter (Signed)
Notified pt rx sent to pharmacy../[facility] 

## 2013-09-15 NOTE — Telephone Encounter (Signed)
Pt called requesting Citalopram refill.  Please advise

## 2013-09-16 ENCOUNTER — Ambulatory Visit (HOSPITAL_BASED_OUTPATIENT_CLINIC_OR_DEPARTMENT_OTHER): Payer: Medicare Other | Admitting: Hematology and Oncology

## 2013-09-16 ENCOUNTER — Encounter: Payer: Self-pay | Admitting: Hematology and Oncology

## 2013-09-16 VITALS — BP 127/89 | HR 67 | Temp 97.7°F | Resp 18 | Ht 66.0 in | Wt 166.1 lb

## 2013-09-16 DIAGNOSIS — IMO0002 Reserved for concepts with insufficient information to code with codable children: Secondary | ICD-10-CM | POA: Diagnosis not present

## 2013-09-16 DIAGNOSIS — F17200 Nicotine dependence, unspecified, uncomplicated: Secondary | ICD-10-CM

## 2013-09-16 DIAGNOSIS — C19 Malignant neoplasm of rectosigmoid junction: Secondary | ICD-10-CM | POA: Diagnosis not present

## 2013-09-16 DIAGNOSIS — K469 Unspecified abdominal hernia without obstruction or gangrene: Secondary | ICD-10-CM

## 2013-09-16 DIAGNOSIS — Z85048 Personal history of other malignant neoplasm of rectum, rectosigmoid junction, and anus: Secondary | ICD-10-CM

## 2013-09-16 NOTE — Assessment & Plan Note (Signed)
This has been repaired. She is pleased with the progress she is making.

## 2013-09-16 NOTE — Assessment & Plan Note (Signed)
I spent some time counseling the patient the importance of tobacco cessation. she is currently attempting to quit on her own 

## 2013-09-16 NOTE — Progress Notes (Signed)
[facility] OFF[facility] PROGRESS NOTE  Patient Care Team: Rowe Clack, MD as PCP - General [place] Dragon, MD (Gastroenterology) Jamey Reas de Berton Lan, MD (Obstetrics and Gynecology) Jana Half, DPM (Podiatry) Heath Lark, MD as Consulting Physician (Hematology and Oncology) Leighton Ruff, MD (General Surgery)  SUMMARY OF ONCOLOGIC HISTORY: This is a patient was diagnosed with rectal cancer after presentation with significant rectal bleeding. Colonoscopy & biopsies confirmed cancer. She received neoadjuvant chemotherapy and radiation therapy followed by low anterior resection in November of 2010. 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 Starting in January of 2011 she received adjuvant FOLFOX chemotherapy complicated by peripheral neuropathy. Oxaliplatin  was subsequently discontinued. She completed all chemotherapy by June of 2011. She was operated at [facility] in 2013 due to poor bowel function In January 2015, she had repair of her parastomal hernia and reconstruction surgery.  INTERVAL HISTORY: Please see below for problem oriented charting. She has lost a lot of weight. This is related to recent surgery. Overall she is doing well. She continue taking Imodium for intermittent diarrhea.  REVIEW OF SYSTEMS:   Constitutional: Denies fevers, chills  Eyes: Denies blurriness of vision Ears, nose, mouth, throat, and face: Denies mucositis or sore throat Respiratory: Denies cough, dyspnea or wheezes Cardiovascular: Denies palpitation, chest discomfort or lower extremity swelling Skin: Denies abnormal skin rashes Lymphatics: Denies new lymphadenopathy or easy bruising Neurological:Denies numbness, tingling or new weaknesses Behavioral/Psych: Mood is stable, no new changes  All other systems were reviewed with the patient and are negative.  I have reviewed the past medical  history, past surgical history, social history and family history with the patient and they are unchanged from previous note.  ALLERGIES:  is allergic to ampicillin; hydrocodone; and penicillins.  MEDICATIONS:  Current Outpatient Prescriptions  Medication Sig Dispense Refill  . acetaminophen (TYLENOL) 500 MG tablet Take 1,000 mg by mouth every 4 (four) hours as needed for mild pain or headache.      . ALPRAZolam (XANAX) 0.5 MG tablet Take 1 tablet (0.5 mg total) by mouth at bedtime as needed for anxiety or sleep.  30 tablet  2  . amitriptyline (ELAVIL) 25 MG tablet Take 1 tablet (25 mg total) by mouth at bedtime as needed for sleep.  30 tablet  5  . citalopram (CELEXA) 20 MG tablet Take 1 tablet (20 mg total) by mouth every morning.  90 tablet  3  . gabapentin (NEURONTIN) 400 MG capsule Take 2 capsules (800 mg total) by mouth 3 (three) times daily.  180 capsule  5  . hydrochlorothiazide (HYDRODIURIL) 25 MG tablet Take 25 mg by mouth every morning.      [name] OVER THE COUNTER MEDICATION Take 2 tablets by mouth every 4 (four) hours as needed (pain (percogesic)). Med has aspirin and tylenol       No current facility-administered medications for this visit.    PHYSICAL EXAMINATION: ECOG PERFORMANCE STATUS: 1 - Symptomatic but completely ambulatory  Filed Vitals:   09/16/13 0932  BP: 127/89  Pulse: 67  Temp: 97.7 F (36.5 C)  Resp: 18   Filed Weights   09/16/13 0932  Weight: 166 lb 1.6 oz (75.342 kg)    GENERAL:alert, no distress and comfortable SKIN: skin color, texture, turgor are normal, no rashes or significant lesions EYES: normal, Conjunctiva are pink and non-injected, sclera clear OROPHARYNX:no exudate, no erythema and lips, buccal mucosa, and  tongue normal  NECK: supple, thyroid normal size, non-tender, without nodularity LYMPH:  no palpable lymphadenopathy in the cervical, axillary or inguinal LUNGS: clear to auscultation and percussion with normal breathing effort HEART:  regular rate & rhythm and no murmurs and no lower extremity edema ABDOMEN:abdomen soft, non-tender and normal bowel sounds. Colostomy looks okay. Well-healed surgical scar. Musculoskeletal:no cyanosis of digits and no clubbing  NEURO: alert & oriented x 3 with fluent speech, no focal motor/sensory deficits  LABORATORY DATA:  I have reviewed the data as listed    Component Value Date/Time   NA 142 09/09/2013 0809   NA 136* 05/17/2013 0525   K 4.1 09/09/2013 0809   K 3.8 05/17/2013 0525   CL 94* 05/17/2013 0525   CL 108* 09/14/2012 1453   CO2 21* 09/09/2013 0809   CO2 26 05/17/2013 0525   GLUCOSE 98 09/09/2013 0809   GLUCOSE 116* 05/17/2013 0525   GLUCOSE 106* 09/14/2012 1453   BUN 18.2 09/09/2013 0809   BUN 11 05/17/2013 0525   CREATININE 0.8 09/09/2013 0809   CREATININE 0.92 05/17/2013 0525   CALCIUM 9.4 09/09/2013 0809   CALCIUM 9.4 05/17/2013 0525   PROT 7.0 09/09/2013 0809   PROT 6.6 10/21/2011 0420   ALBUMIN 3.7 09/09/2013 0809   ALBUMIN 3.5 10/21/2011 0420   AST 26 09/09/2013 0809   AST 28 10/21/2011 0420   ALT 28 09/09/2013 0809   ALT 21 10/21/2011 0420   ALKPHOS 95 09/09/2013 0809   ALKPHOS 102 10/21/2011 0420   BILITOT 0.48 09/09/2013 0809   BILITOT 0.3 10/21/2011 0420   GFRNONAA 68* 05/17/2013 0525   GFRAA 79* 05/17/2013 0525    No results found for this basename: SPEP,  UPEP,   kappa and lambda light chains    Lab Results  Component Value Date   WBC 10.0 09/09/2013   NEUTROABS 7.4* 09/09/2013   HGB 13.2 09/09/2013   HCT 41.1 09/09/2013   MCV 86.2 09/09/2013   PLT 264 09/09/2013      Chemistry      Component Value Date/Time   NA 142 09/09/2013 0809   NA 136* 05/17/2013 0525   K 4.1 09/09/2013 0809   K 3.8 05/17/2013 0525   CL 94* 05/17/2013 0525   CL 108* 09/14/2012 1453   CO2 21* 09/09/2013 0809   CO2 26 05/17/2013 0525   BUN 18.2 09/09/2013 0809   BUN 11 05/17/2013 0525   CREATININE 0.8 09/09/2013 0809   CREATININE 0.92 05/17/2013 0525      Component Value Date/Time   CALCIUM 9.4 09/09/2013 0809   CALCIUM  9.4 05/17/2013 0525   ALKPHOS 95 09/09/2013 0809   ALKPHOS 102 10/21/2011 0420   AST 26 09/09/2013 0809   AST 28 10/21/2011 0420   ALT 28 09/09/2013 0809   ALT 21 10/21/2011 0420   BILITOT 0.48 09/09/2013 0809   BILITOT 0.3 10/21/2011 0420     RADIOGRAPHIC STUDIES: I reviewed her most recent imaging study which show no evidence of recurrence. I have personally reviewed the radiological images as listed and agreed with the findings in the report.  ASSESSMENT & PLAN:  PERSONAL HX RECTAL CANCER She is almost 5 years out from treatment. A recent CEA level and imaging study showed no evidence of recurrence. She will be discharged from this clinic in follow with GI only. I did offer to follow her on a yearly basis but the patient declined.  SMOKER I spent some time counseling the patient the importance of tobacco  cessation. she is currently attempting to quit on her own   Peristomal hernia This has been repaired. She is pleased with the progress she is making.    All questions were answered. The patient knows to call the clinic with any problems, questions or concerns. No barriers to learning was detected. I spent 25 minutes counseling the patient face to face. The total time spent in the appointment was 30 minutes and more than 50% was on counseling and review of test results     Heath Lark, MD 09/16/2013 12:43 PM

## 2013-09-16 NOTE — Assessment & Plan Note (Signed)
She is almost 5 years out from treatment. A recent CEA level and imaging study showed no evidence of recurrence. She will be discharged from this clinic in follow with GI only. I did offer to follow her on a yearly basis but the patient declined.

## 2013-09-30 ENCOUNTER — Ambulatory Visit (INDEPENDENT_AMBULATORY_CARE_PROVIDER_SITE_OTHER): Payer: Medicare Other | Admitting: Internal Medicine

## 2013-09-30 ENCOUNTER — Encounter: Payer: Self-pay | Admitting: Internal Medicine

## 2013-09-30 VITALS — BP 110/66 | HR 72 | Ht 66.0 in | Wt 166.0 lb

## 2013-09-30 DIAGNOSIS — Z85048 Personal history of other malignant neoplasm of rectum, rectosigmoid junction, and anus: Secondary | ICD-10-CM

## 2013-09-30 NOTE — Patient Instructions (Signed)
Dr Joyice Faster

## 2013-09-30 NOTE — Progress Notes (Signed)
[name] 1955/09/28 [id number]  Note: This dictation was prepared with Dragon digital system. Any transcriptional errors that result from this procedure are unintentional.   History of Present Illness:  This is a 57 year old African American female with a rectal carcinoma resected in November 2010 with low anterior resection. She is status post radiation followed by radiation proctitis necessitating a diverting colostomy at [facility] in 2013.[name] Her last colonoscopy in September 2013 showed a normal efferent and afferent limb. She had a reduction of the large parastomal hernia containing small bowel in January 2015.She  Is satisfied with the results. The new colostomy has been reinforced with mesh. A CT scan of the abdomen showed  changes attributed to a prior surgery. She has a small stable fluid collection. Her CEA level is less than 1. She would be due for a colonoscopy in September 2016.    Past Medical History  Diagnosis Date  . 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 Hypertension   . Diverticulosis   . Radiation proctitis     mild  . Adenomatous colon polyp   . Anemia, unspecified   . Depression   . Nonspecific colitis 10/10/11  . Cocaine abuse     as recently as 10/21/11  . Hernia 03/18/2013  . Neuromuscular disorder   . 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 Incontinence of bowel     since colostomy  . History of transfusion   . Difficulty sleeping     takes meds to sleep    Past Surgical History  Procedure Laterality Date  . Mouth surgery  10/2009  . Low anterior bowel resection  03/07/09  . Colostomy  2013    [facility]  . Portacath placement      and removal  . Laparoscopic parastomal hernia N/A 05/12/2013    Procedure: LAPAROSCOPIC PARASTOMAL HERNIA;  Surgeon: Leighton Ruff, MD;  Location: [facility];  Service: General;  Laterality: N/A;  . Hernia repair      Allergies  Allergen Reactions  . Ampicillin Hives  . Hydrocodone Itching  .  Penicillins Other (See Comments)    Unknown reaction    Family history and social history have been reviewed.  Review of Systems:   The remainder of the 10 point ROS is negative except as outlined in the H&P  Physical Exam: General Appearance Well developed, in no distress Eyes  Non icteric  HEENT  Non traumatic, normocephalic  Mouth No lesion, tongue papillated, no cheilosis Neck Supple without adenopathy, thyroid not enlarged, no carotid bruits, no JVD Lungs Clear to auscultation bilaterally COR Normal S1, normal S2, regular rhythm, no murmur, quiet precordium Abdomen soft with normoactive bowel sounds. Well-healed surgical scar from prior colostomy. New colostomy with healthy-appearing stoma in left to the umbilicus. Rectal status post rectal closure Extremities  No pedal edema Skin No lesions Neurological Alert and oriented x 3 Psychological Normal mood and affect  Assessment and Plan:   Problem #41 58 year old [place] American female with rectal carcinoma resected, radiated and treated with chemotherapy. She is currently cancer free. She has a permanent diverting colostomy .She has had a recent  repair of a parastomal hernia. The stoma  is functioning well and we will see her in one year. A recall colonoscopy will be planned for September 2016.    Delfin Edis 09/30/2013

## 2013-10-22 ENCOUNTER — Emergency Department (HOSPITAL_COMMUNITY): Payer: Medicare Other

## 2013-10-22 ENCOUNTER — Encounter (HOSPITAL_COMMUNITY): Payer: Self-pay | Admitting: Emergency Medicine

## 2013-10-22 ENCOUNTER — Emergency Department (HOSPITAL_COMMUNITY)
Admission: EM | Admit: 2013-10-22 | Discharge: 2013-10-22 | Disposition: A | Discharge status: Home / Self Care | Payer: Medicare Other | Attending: Emergency Medicine | Admitting: Emergency Medicine

## 2013-10-22 DIAGNOSIS — G608 Other hereditary and idiopathic neuropathies: Secondary | ICD-10-CM | POA: Insufficient documentation

## 2013-10-22 DIAGNOSIS — Z85048 Personal history of other malignant neoplasm of rectum, rectosigmoid junction, and anus: Secondary | ICD-10-CM | POA: Insufficient documentation

## 2013-10-22 DIAGNOSIS — Y929 Unspecified place or not applicable: Secondary | ICD-10-CM | POA: Insufficient documentation

## 2013-10-22 DIAGNOSIS — G4700 Insomnia, unspecified: Secondary | ICD-10-CM | POA: Diagnosis not present

## 2013-10-22 DIAGNOSIS — I10 Essential (primary) hypertension: Secondary | ICD-10-CM | POA: Insufficient documentation

## 2013-10-22 DIAGNOSIS — Z79899 Other long term (current) drug therapy: Secondary | ICD-10-CM | POA: Insufficient documentation

## 2013-10-22 DIAGNOSIS — S4980XA Other specified injuries of shoulder and upper arm, unspecified arm, initial encounter: Secondary | ICD-10-CM | POA: Diagnosis not present

## 2013-10-22 DIAGNOSIS — Z860100 Personal history of colon polyps, unspecified: Secondary | ICD-10-CM | POA: Insufficient documentation

## 2013-10-22 DIAGNOSIS — S46911A Strain of unspecified muscle, fascia and tendon at shoulder and upper arm level, right arm, initial encounter: Secondary | ICD-10-CM

## 2013-10-22 DIAGNOSIS — F17200 Nicotine dependence, unspecified, uncomplicated: Secondary | ICD-10-CM | POA: Diagnosis not present

## 2013-10-22 DIAGNOSIS — IMO0002 Reserved for concepts with insufficient information to code with codable children: Secondary | ICD-10-CM | POA: Insufficient documentation

## 2013-10-22 DIAGNOSIS — Z8719 Personal history of other diseases of the digestive system: Secondary | ICD-10-CM | POA: Insufficient documentation

## 2013-10-22 DIAGNOSIS — Z880 Allergy status to penicillin: Secondary | ICD-10-CM | POA: Diagnosis not present

## 2013-10-22 DIAGNOSIS — S46909A Unspecified injury of unspecified muscle, fascia and tendon at shoulder and upper arm level, unspecified arm, initial encounter: Secondary | ICD-10-CM | POA: Diagnosis not present

## 2013-10-22 DIAGNOSIS — Y9389 Activity, other specified: Secondary | ICD-10-CM | POA: Insufficient documentation

## 2013-10-22 DIAGNOSIS — M25519 Pain in unspecified shoulder: Secondary | ICD-10-CM | POA: Diagnosis not present

## 2013-10-22 DIAGNOSIS — W010XXA Fall on same level from slipping, tripping and stumbling without subsequent striking against object, initial encounter: Secondary | ICD-10-CM | POA: Insufficient documentation

## 2013-10-22 MED ORDER — MELOXICAM 15 MG PO TABS: 15.0000 mg | ORAL_TABLET | Freq: Every day | ORAL | Status: DC

## 2013-10-22 MED ORDER — OXYCODONE-ACETAMINOPHEN 5-325 MG PO TABS: 1.0000 | ORAL_TABLET | ORAL | Status: DC | PRN

## 2013-10-22 MED ORDER — OXYCODONE-ACETAMINOPHEN 5-325 MG PO TABS
1.0000 | ORAL_TABLET | Freq: Once | ORAL | Status: AC
  Administered 2013-10-22: 1 via ORAL
  Filled 2013-10-22: qty 1

## 2013-10-22 NOTE — ED Provider Notes (Signed)
CSN: [id number]     Arrival date & time 10/22/13  1913 History   None    This chart was scribed for non-physician practitioner, Hazel Sams, PA-C working with Jasper Riling. Alvino Chapel, MD by Forrestine Him, ED Scribe. This patient was seen in room WTR6/WTR6 and the patient's care was started at 8:41 PM.   Chief Complaint  Patient presents with  . Fall  . Arm Injury   The history is provided by the patient. No language interpreter was used.    HPI Comments: [name] is a 58 y.o. Female with a PMHx of neuropathy and HTN who presents to the Emergency Department complaining of a fall that occurred a few hours prior to arrival. Pt states she tripped over her dog this afternoon resulting in her falling and landing on her R side. States she is unaware of the mechanism of her arm at time of fall. She now c/o constant, moderate R shoulder pain. She describes this pain as achy. This pain is exacerbated with abduction of the R arm. At this time she denies any fever, chills, new numbness, paresthesia, or loss of sensation. She reports known medications to Ampicillin, Hydrocodone, and Penicillin. No other concerns this visit.  Past Medical History  Diagnosis Date  . 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 Hypertension   . Diverticulosis   . Radiation proctitis     mild  . Adenomatous colon polyp   . Anemia, unspecified   . Depression   . Nonspecific colitis 10/10/11  . Cocaine abuse     as recently as 10/21/11  . Hernia 03/18/2013  . Neuromuscular disorder   . 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 Incontinence of bowel     since colostomy  . History of transfusion   . Difficulty sleeping     takes meds to sleep   Past Surgical History  Procedure Laterality Date  . Mouth surgery  10/2009  . Low anterior bowel resection  03/07/09  . Colostomy  2013    [facility]  . Portacath placement      and removal  . Laparoscopic parastomal hernia N/A 05/12/2013     Procedure: LAPAROSCOPIC PARASTOMAL HERNIA;  Surgeon: Leighton Ruff, MD;  Location: [facility];  Service: General;  Laterality: N/A;  . Hernia repair     Family History  Problem Relation Age of Onset  . Colon cancer Mother   . Stomach cancer Sister   . Colon polyps Sister   . Diabetes Father   . Cirrhosis Brother   . Pulmonary Hypertension Brother    History  Substance Use Topics  . Smoking status: Current Every Day Smoker -- 40 years    Types: Cigarettes  . Smokeless tobacco: Never Used     Comment: form given 04-15-12.divorced, lives with sister Willaim Rayas. Food Service at [employer]  . Alcohol Use: Yes     Comment: occasional  driink   OB History   Grav Para Term Preterm Abortions TAB SAB Ect Mult Living                 Review of Systems  Constitutional: Negative for fever and chills.  Musculoskeletal: Positive for arthralgias (R shoulder). Negative for back pain.  Neurological: Negative for weakness and numbness.  All other systems reviewed and are negative.     Allergies  Ampicillin; Hydrocodone; and Penicillins  Home Medications   Prior to Admission medications  Medication Sig Start Date End Date Taking? Authorizing Provider  ALPRAZolam Duanne Moron) 0.5 MG tablet Take 1 tablet (0.5 mg total) by mouth at bedtime as needed for anxiety or sleep. 09/14/13  Yes Rowe Clack, MD  amitriptyline (ELAVIL) 25 MG tablet Take 1 tablet (25 mg total) by mouth at bedtime as needed for sleep. 09/14/13  Yes Rowe Clack, MD  citalopram (CELEXA) 20 MG tablet Take 1 tablet (20 mg total) by mouth every morning. 09/15/13  Yes Rowe Clack, MD  gabapentin (NEURONTIN) 400 MG capsule Take 2 capsules (800 mg total) by mouth 3 (three) times daily. 09/14/13  Yes Rowe Clack, MD  hydrochlorothiazide (HYDRODIURIL) 25 MG tablet Take 25 mg by mouth every morning.   Yes Historical Provider, MD   Triage Vitals: BP 135/89  Pulse 85  Temp(Src) 98 F (36.7 C) (Oral)   Resp 18  SpO2 100%   Physical Exam  Nursing note and vitals reviewed. Constitutional: She is oriented to person, place, and time. She appears well-developed and well-nourished.  HENT:  Head: Normocephalic and atraumatic.  Eyes: EOM are normal.  Neck: Normal range of motion. Neck supple.  No cervical midline tenderness.  Pulmonary/Chest: Effort normal. No respiratory distress.  Abdominal: She exhibits no distension.  Musculoskeletal: Normal range of motion.  Reduced range of motion of the right shoulder secondary to pain. Patient does have near full posterior extension however limited anterior flexion and abduction. There is tenderness over the anterior lateral portion of the shoulder and deltoid. No gross deformity. No pain or deformity over the a.c. joint. Normal range of motion and elbow exam. Normal distal grip strengths, pulses and sensations in the right hand.  Neurological: She is alert and oriented to person, place, and time.  Skin: Skin is warm.  Psychiatric: She has a normal mood and affect.    ED Course  Procedures  DIAGNOSTIC STUDIES: Oxygen Saturation is 100% on RA, Normal by my interpretation.    COORDINATION OF CARE: 8:46 PM- Will order DG Shoulder R. Will give Percocet. Discussed treatment plan with pt at bedside and pt agreed to plan.     X-rays reviewed. No signs of fracture or dislocation in the shoulder. No gross deformities. Patient is neurovascularly intact distally. This time suspect slight strain and contusion. Placed in sling and recommend Rice therapy.   Imaging Review Dg Shoulder Right  10/22/2013   CLINICAL DATA:  Fall, right shoulder pain  EXAM: RIGHT SHOULDER - 2+ VIEW  COMPARISON:  None.  FINDINGS: There is no evidence of fracture or dislocation. There is no evidence of arthropathy or other focal bone abnormality. Soft tissues are unremarkable. Axillary view is suboptimal positioned.  IMPRESSION: Negative.   Electronically Signed   By: Conchita Paris  M.D.   On: 10/22/2013 20:15     MDM   Final diagnoses:  Shoulder strain, right, initial encounter      I personally performed the services described in this documentation, which was scribed in my presence. The recorded information has been reviewed and is accurate.    Martie Lee, PA-C 10/22/13 2103

## 2013-10-22 NOTE — ED Notes (Signed)
Ortho called for arm sling.

## 2013-10-22 NOTE — ED Notes (Addendum)
Initial contact: pt A&Ox4. Moving all extremities. pt reports tripping over dog and hitting right shoulder on metal briefcase and heavy recliner. Pt has present hand grip on right side but isn't able to extend shoulder. No obvious deformities. Awaiting MD. No other questions/concerns.

## 2013-10-22 NOTE — ED Notes (Signed)
PT states that her dog tripped her this afternoon and injured her rt shoulder.  C/o pain since.  Unable to lift arm above head.

## 2013-10-22 NOTE — Discharge Instructions (Signed)
Your x-ray did not show any broken bones or other concerning injury. At this time your providers feel you may have strain and bruising to your shoulder. Use rest, ice, compression and elevation to reduce pain and swelling. Followup with your primary care provider for continued evaluation and treatment.    Shoulder Sprain A shoulder sprain is the result of damage to the tough, fiber-like tissues (ligaments) that help hold your shoulder in place. The ligaments may be stretched or torn. Besides the main shoulder joint (the ball and socket), there are several smaller joints that connect the bones in this area. A sprain usually involves one of those joints. Most often it is the acromioclavicular (or AC) joint. That is the joint that connects the collarbone (clavicle) and the shoulder blade (scapula) at the top point of the shoulder blade (acromion). A shoulder sprain is a mild form of what is called a shoulder separation. Recovering from a shoulder sprain may take some time. For some, pain lingers for several months. Most people recover without long term problems. CAUSES   A shoulder sprain is usually caused by some kind of trauma. This might be:  Falling on an outstretched arm.  Being hit hard on the shoulder.  Twisting the arm.  Shoulder sprains are more likely to occur in people who:  Play sports.  Have balance or coordination problems. SYMPTOMS   Pain when you move your shoulder.  Limited ability to move the shoulder.  Swelling and tenderness on top of the shoulder.  Redness or warmth in the shoulder.  Bruising.  A change in the shape of the shoulder. DIAGNOSIS  Your healthcare provider may:  Ask about your symptoms.  Ask about recent activity that might have caused those symptoms.  Examine your shoulder. You may be asked to do simple exercises to test movement. The other shoulder will be examined for comparison.  Order some tests that provide a look inside the body. They  can show the extent of the injury. The tests could include:  X-rays.  CT (computed tomography) scan.  MRI (magnetic resonance imaging) scan. RISKS AND COMPLICATIONS  Loss of full shoulder motion.  Ongoing shoulder pain. TREATMENT  How long it takes to recover from a shoulder sprain depends on how severe it was. Treatment options may include:  Rest. You should not use the arm or shoulder until it heals.  Ice. For 2 or 3 days after the injury, put an ice pack on the shoulder up to 4 times a day. It should stay on for 15 to 20 minutes each time. Wrap the ice in a towel so it does not touch your skin.  Over-the-counter medicine to relieve pain.  A sling or brace. This will keep the arm still while the shoulder is healing.  Physical therapy or rehabilitation exercises. These will help you regain strength and motion. Ask your healthcare provider when it is OK to begin these exercises.  Surgery. The need for surgery is rare with a sprained shoulder, but some people may need surgery to keep the joint in place and reduce pain. HOME CARE INSTRUCTIONS   Ask your healthcare provider about what you should and should not do while your shoulder heals.  Make sure you know how to apply ice to the correct area of your shoulder.  Talk with your healthcare provider about which medications should be used for pain and swelling.  If rehabilitation therapy will be needed, ask your healthcare provider to refer you to a therapist. If  it is not recommended, then ask about at-home exercises. Find out when exercise should begin. SEEK MEDICAL CARE IF:  Your pain, swelling, or redness at the joint increases. SEEK IMMEDIATE MEDICAL CARE IF:   You have a fever.  You cannot move your arm or shoulder. Document Released: 08/17/2008 Document Revised: 06/23/2011 Document Reviewed: 08/17/2008 [facility] Patient Information 2015 [place], [place]. This information is not intended to replace advice given to you by  your health care provider. Make sure you discuss any questions you have with your health care provider.    Cryotherapy Cryotherapy means treatment with cold. Ice or gel packs can be used to reduce both pain and swelling. Ice is the most helpful within the first 24 to 48 hours after an injury or flareup from overusing a muscle or joint. Sprains, strains, spasms, burning pain, shooting pain, and aches can all be eased with ice. Ice can also be used when recovering from surgery. Ice is effective, has very few side effects, and is safe for most people to use. PRECAUTIONS  Ice is not a safe treatment option for people with:  Raynaud's phenomenon. This is a condition affecting small blood vessels in the extremities. Exposure to cold may cause your problems to return.  Cold hypersensitivity. There are many forms of cold hypersensitivity, including:  Cold urticaria. Red, itchy hives appear on the skin when the tissues begin to warm after being iced.  Cold erythema. This is a red, itchy rash caused by exposure to cold.  Cold hemoglobinuria. Red blood cells break down when the tissues begin to warm after being iced. The hemoglobin that carry oxygen are passed into the urine because they cannot combine with blood proteins fast enough.  Numbness or altered sensitivity in the area being iced. If you have any of the following conditions, do not use ice until you have discussed cryotherapy with your caregiver:  Heart conditions, such as arrhythmia, angina, or chronic heart disease.  High blood pressure.  Healing wounds or open skin in the area being iced.  Current infections.  Rheumatoid arthritis.  Poor circulation.  Diabetes. Ice slows the blood flow in the region it is applied. This is beneficial when trying to stop inflamed tissues from spreading irritating chemicals to surrounding tissues. However, if you expose your skin to cold temperatures for too long or without the proper protection,  you can damage your skin or nerves. Watch for signs of skin damage due to cold. HOME CARE INSTRUCTIONS Follow these tips to use ice and cold packs safely.  Place a dry or damp towel between the ice and skin. A damp towel will cool the skin more quickly, so you may need to shorten the time that the ice is used.  For a more rapid response, add gentle compression to the ice.  Ice for no more than 10 to 20 minutes at a time. The bonier the area you are icing, the less time it will take to get the benefits of ice.  Check your skin after 5 minutes to make sure there are no signs of a poor response to cold or skin damage.  Rest 20 minutes or more in between uses.  Once your skin is numb, you can end your treatment. You can test numbness by very lightly touching your skin. The touch should be so light that you do not see the skin dimple from the pressure of your fingertip. When using ice, most people will feel these normal sensations in this order: cold, burning,  aching, and numbness.  Do not use ice on someone who cannot communicate their responses to pain, such as small children or people with dementia. HOW TO MAKE AN ICE PACK Ice packs are the most common way to use ice therapy. Other methods include ice massage, ice baths, and cryo-sprays. Muscle creams that cause a cold, tingly feeling do not offer the same benefits that ice offers and should not be used as a substitute unless recommended by your caregiver. To make an ice pack, do one of the following:  Place crushed ice or a bag of frozen vegetables in a sealable plastic bag. Squeeze out the excess air. Place this bag inside another plastic bag. Slide the bag into a pillowcase or place a damp towel between your skin and the bag.  Mix 3 parts water with 1 part rubbing alcohol. Freeze the mixture in a sealable plastic bag. When you remove the mixture from the freezer, it will be slushy. Squeeze out the excess air. Place this bag inside another  plastic bag. Slide the bag into a pillowcase or place a damp towel between your skin and the bag. SEEK MEDICAL CARE IF:  You develop white spots on your skin. This may give the skin a blotchy (mottled) appearance.  Your skin turns blue or pale.  Your skin becomes waxy or hard.  Your swelling gets worse. MAKE SURE YOU:   Understand these instructions.  Will watch your condition.  Will get help right away if you are not doing well or get worse. Document Released: 11/25/2010 Document Revised: 06/23/2011 Document Reviewed: 11/25/2010 [facility] Patient Information 2015 [place], [place]. This information is not intended to replace advice given to you by your health care provider. Make sure you discuss any questions you have with your health care provider.

## 2013-10-24 NOTE — ED Provider Notes (Signed)
Medical screening examination/treatment/procedure(s) were performed by non-physician practitioner and as supervising physician I was immediately available for consultation/collaboration.   EKG Interpretation None       Jasper Riling. Alvino Chapel, MD 10/24/13 [id number]

## 2013-11-07 ENCOUNTER — Ambulatory Visit (INDEPENDENT_AMBULATORY_CARE_PROVIDER_SITE_OTHER): Payer: Medicare Other | Admitting: Internal Medicine

## 2013-11-07 ENCOUNTER — Encounter: Payer: Self-pay | Admitting: Internal Medicine

## 2013-11-07 VITALS — BP 148/90 | HR 68 | Temp 98.2°F | Wt 170.4 lb

## 2013-11-07 DIAGNOSIS — F17200 Nicotine dependence, unspecified, uncomplicated: Secondary | ICD-10-CM

## 2013-11-07 DIAGNOSIS — IMO0001 Reserved for inherently not codable concepts without codable children: Secondary | ICD-10-CM

## 2013-11-07 DIAGNOSIS — S4991XS Unspecified injury of right shoulder and upper arm, sequela: Secondary | ICD-10-CM

## 2013-11-07 DIAGNOSIS — S46911S Strain of unspecified muscle, fascia and tendon at shoulder and upper arm level, right arm, sequela: Secondary | ICD-10-CM

## 2013-11-07 DIAGNOSIS — R479 Unspecified speech disturbances: Secondary | ICD-10-CM

## 2013-11-07 DIAGNOSIS — R4789 Other speech disturbances: Secondary | ICD-10-CM

## 2013-11-07 DIAGNOSIS — I10 Essential (primary) hypertension: Secondary | ICD-10-CM | POA: Diagnosis not present

## 2013-11-07 MED ORDER — DICLOFENAC SODIUM 1 % TD GEL: 2.0000 g | Freq: Four times a day (QID) | TRANSDERMAL | Status: DC

## 2013-11-07 MED ORDER — LISINOPRIL 20 MG PO TABS: 20.0000 mg | ORAL_TABLET | Freq: Every day | ORAL | Status: DC

## 2013-11-07 NOTE — Patient Instructions (Signed)
Use an anti-inflammatory cream such as Aspercreme or Zostrix cream twice a day to the affected area as needed if the prescription gel is too expensive. In lieu of this warm moist compresses or  hot water bottle can be used. Do not apply ice .The Sports Medicine referral will be scheduled and you'll be notified of the time.  Consider glucosamine sulfate 1500 mg daily for joint symptoms. Take this daily  Until R shoulder pain gone. This will rehydrate the cartilages.  Please take enteric-coated aspirin 81 mg daily with breakfast.  Please think about quitting smoking. Review the risks we discussed. Please call 1-800-QUIT-NOW [phone]) for free smoking cessation counseling.

## 2013-11-07 NOTE — Progress Notes (Signed)
   Subjective:    Patient ID: [name], female    DOB: [birth year], 58 y.o.   MRN: [id number]  HPI   She was seen in emergency room 10/22/13 having tripped over her dog in the dark. There was no neuro or cardiac prodrome prior to the fall  She struck her right upper shoulder area. Films were negative. Rest, ice, compression, and elevation were recommended for strain/contusion. Sling was not employed as recommended.  She continued to have pain in this area with decreased ROM ,especially elevation. Increased pain with elevation or abduction described.  Last week she had slurred speech and stuttering for approximately an hour. This was noted when she was talking to her relative on the phone.  She is an active smoker; CV risk was discussed.  Labs were done in May of this year. Renal function and potassium were normal.Glucose had been elevated in 01/15. No lipids since 2012.     Review of Systems  Denied were any change in heart rhythm or rate prior to either event. There was no associated chest pain or shortness of breath .  Also specifically denied prior to either episode were headache, limb weakness, tingling, or numbness. No seizure activity noted.      Objective:   Physical Exam Positive or pertinent  findings: There is a slight flexion contracture of the fifth right finger. Full ROM R shoulder but subjective pain over deltoid with elevation. Ostomy bag is noted in the left lower quadrant.  General appearance is one of good health and nourishment w/o distress. Eyes: No conjunctival inflammation or scleral icterus is present. Oral exam:  lips and gums are healthy appearing.There is no oropharyngeal erythema or exudate noted.  Heart:  Normal rate and regular rhythm. S1 and S2 normal without gallop, murmur, click, rub or other extra sounds  Lungs:Chest clear to auscultation; no wheezes, rhonchi,rales ,or rubs present.No increased work of breathing.  Abdomen: bowel sounds  normal, soft and non-tender without masses, organomegaly or hernias noted.  No guarding or rebound .  Musculoskeletal: Able to lie flat and sit up without help. Negative straight leg raising bilaterally. Gait normal Skin:Warm & dry.  Intact without suspicious lesions or rashes ; no jaundice or tenting Lymphatic: No lymphadenopathy is noted about the head, neck, axilla Oriented X 3; articulate.  No cranial nerve deficit noted.DTRs, strength & tone WNL.              Assessment & Plan:  #1 tendon injury with residual tendinitis. Rotator cuff tear is not present clinically.  #2 temporary speech impairment; rule out TIA. This is in the context of suboptimally controlled blood pressure and continued smoking  #3 intermittent hyperglycemia  See after visit summary.

## 2013-11-07 NOTE — Progress Notes (Signed)
   Subjective:    Patient ID: [name], female    DOB: 11-22-1955, 58 y.o.   MRN: [id number]  HPI Pt here for hospital f/u. Seen in ED 10/22/13 s/p fall on R shoulder and subsequent pain. Per ED report, pt fell over dog and landed on her R sided body. S/p R shoulder Xray negative for dislocation or fracture. Pt given percocet x 6 pills and mobic and d/c'd.   Today the pt is continuing to have pain at night in particular. The pt reports that the mobic made her sick so she quit taking it. She has completed the percocet. She was also given a sling in the ED that she did not use.   The pain is on the superior aspect of her R shoulder. The pain is felt with only with movement, particularly overhead or internal rotation. It is also worse at night because she sleeps on her R side due to a L sided ostomy. The pain is achy. The pain radiates to her R elbow. She does have a history of neuropathy with constant tingling in her fingers. The pain is rated at a 7/10 when she moves. She has taken tylenol with no relief.   Review of Systems  Constitutional: Negative for fever and chills.  Musculoskeletal: Negative for neck pain and neck stiffness.       No radiation to neck  Neurological: Positive for numbness.      Objective:   Physical Exam Decreased passive ROM of R shoulder Pain with internal rotation of R shoulder and abduction  Mild tenderness to touch      Assessment & Plan:

## 2013-11-07 NOTE — Progress Notes (Signed)
Pre visit review using our clinic review tool, if applicable. No additional management support is needed unless otherwise documented below in the visit note. 

## 2013-11-08 ENCOUNTER — Other Ambulatory Visit: Payer: Self-pay | Admitting: Internal Medicine

## 2013-11-08 DIAGNOSIS — G458 Other transient cerebral ischemic attacks and related syndromes: Secondary | ICD-10-CM

## 2013-11-08 DIAGNOSIS — R7309 Other abnormal glucose: Secondary | ICD-10-CM

## 2013-11-09 ENCOUNTER — Telehealth: Payer: Self-pay | Admitting: Internal Medicine

## 2013-11-09 ENCOUNTER — Telehealth: Payer: Self-pay | Admitting: *Deleted

## 2013-11-09 NOTE — Telephone Encounter (Signed)
Left msg on triage changing medical supply company. Will be getting her coloscopy bags & supplies threw [place] [facility]. Called pt back she stated that Dr. Olevia Perches went ahead and sign orders to disregard msg...[name]

## 2013-11-09 NOTE — Telephone Encounter (Signed)
I have spoken to [place] at [employer] (phone [phone]) and have okayed medical supplies for patient's ostomy.

## 2013-11-10 ENCOUNTER — Telehealth: Payer: Self-pay | Admitting: *Deleted

## 2013-11-10 NOTE — Telephone Encounter (Signed)
Left msg on triage stating saw Dr. Linna Darner on Mon and he was going to make an appt to see sport medicine md. Someone called yesterday but they identified there self as cardiology. Checking status. Called pt bck no answer LMOM not sure why someone called from cariology md put a referral in for pt to see sport medicaine md here "Dr Tamala Julian". Left msg for her to call and make appt to see Dr. Tamala Julian...[name]

## 2013-11-16 ENCOUNTER — Telehealth: Payer: Self-pay | Admitting: Family Medicine

## 2013-11-16 ENCOUNTER — Ambulatory Visit: Payer: Medicare Other | Admitting: Family Medicine

## 2013-11-16 NOTE — Telephone Encounter (Signed)
Patient no showed for new patient appointment on 08/5.  Please advise.

## 2013-11-16 NOTE — Telephone Encounter (Signed)
Noted  

## 2013-11-30 ENCOUNTER — Ambulatory Visit: Payer: Medicare Other | Admitting: Family Medicine

## 2013-12-05 ENCOUNTER — Ambulatory Visit: Payer: Medicare Other | Admitting: Internal Medicine

## 2013-12-12 ENCOUNTER — Other Ambulatory Visit: Payer: Self-pay | Admitting: Internal Medicine

## 2013-12-13 NOTE — Telephone Encounter (Signed)
Ok x 1

## 2013-12-13 NOTE — Telephone Encounter (Signed)
Faxed hardcopy to [place] [facility]

## 2013-12-13 NOTE — Telephone Encounter (Signed)
Done hardcopy to [facility]  

## 2013-12-16 ENCOUNTER — Ambulatory Visit: Payer: Medicare Other | Admitting: Family Medicine

## 2013-12-16 DIAGNOSIS — Z0289 Encounter for other administrative examinations: Secondary | ICD-10-CM

## 2013-12-23 ENCOUNTER — Encounter (HOSPITAL_COMMUNITY): Payer: Self-pay | Admitting: Emergency Medicine

## 2013-12-23 ENCOUNTER — Emergency Department (HOSPITAL_COMMUNITY)
Admission: EM | Admit: 2013-12-23 | Discharge: 2013-12-23 | Disposition: A | Discharge status: Home / Self Care | Payer: Medicare Other | Attending: Emergency Medicine | Admitting: Emergency Medicine

## 2013-12-23 ENCOUNTER — Emergency Department (HOSPITAL_COMMUNITY): Payer: Medicare Other

## 2013-12-23 DIAGNOSIS — F3289 Other specified depressive episodes: Secondary | ICD-10-CM | POA: Diagnosis not present

## 2013-12-23 DIAGNOSIS — S62319A Displaced fracture of base of unspecified metacarpal bone, initial encounter for closed fracture: Secondary | ICD-10-CM | POA: Diagnosis not present

## 2013-12-23 DIAGNOSIS — S6290XA Unspecified fracture of unspecified wrist and hand, initial encounter for closed fracture: Secondary | ICD-10-CM | POA: Diagnosis not present

## 2013-12-23 DIAGNOSIS — Y9389 Activity, other specified: Secondary | ICD-10-CM | POA: Diagnosis not present

## 2013-12-23 DIAGNOSIS — I10 Essential (primary) hypertension: Secondary | ICD-10-CM | POA: Insufficient documentation

## 2013-12-23 DIAGNOSIS — G589 Mononeuropathy, unspecified: Secondary | ICD-10-CM | POA: Diagnosis not present

## 2013-12-23 DIAGNOSIS — F17200 Nicotine dependence, unspecified, uncomplicated: Secondary | ICD-10-CM | POA: Insufficient documentation

## 2013-12-23 DIAGNOSIS — S62307A Unspecified fracture of fifth metacarpal bone, left hand, initial encounter for closed fracture: Secondary | ICD-10-CM

## 2013-12-23 DIAGNOSIS — Y9229 Other specified public building as the place of occurrence of the external cause: Secondary | ICD-10-CM | POA: Insufficient documentation

## 2013-12-23 DIAGNOSIS — Z85048 Personal history of other malignant neoplasm of rectum, rectosigmoid junction, and anus: Secondary | ICD-10-CM | POA: Insufficient documentation

## 2013-12-23 DIAGNOSIS — IMO0002 Reserved for concepts with insufficient information to code with codable children: Secondary | ICD-10-CM | POA: Diagnosis not present

## 2013-12-23 DIAGNOSIS — Z862 Personal history of diseases of the blood and blood-forming organs and certain disorders involving the immune mechanism: Secondary | ICD-10-CM | POA: Diagnosis not present

## 2013-12-23 DIAGNOSIS — Z8719 Personal history of other diseases of the digestive system: Secondary | ICD-10-CM | POA: Diagnosis not present

## 2013-12-23 DIAGNOSIS — S6990XA Unspecified injury of unspecified wrist, hand and finger(s), initial encounter: Secondary | ICD-10-CM | POA: Insufficient documentation

## 2013-12-23 DIAGNOSIS — Z860100 Personal history of colon polyps, unspecified: Secondary | ICD-10-CM | POA: Diagnosis not present

## 2013-12-23 DIAGNOSIS — Z880 Allergy status to penicillin: Secondary | ICD-10-CM | POA: Insufficient documentation

## 2013-12-23 DIAGNOSIS — W1809XA Striking against other object with subsequent fall, initial encounter: Secondary | ICD-10-CM | POA: Insufficient documentation

## 2013-12-23 DIAGNOSIS — S62619A Displaced fracture of proximal phalanx of unspecified finger, initial encounter for closed fracture: Secondary | ICD-10-CM

## 2013-12-23 DIAGNOSIS — F329 Major depressive disorder, single episode, unspecified: Secondary | ICD-10-CM | POA: Insufficient documentation

## 2013-12-23 DIAGNOSIS — Z79899 Other long term (current) drug therapy: Secondary | ICD-10-CM | POA: Diagnosis not present

## 2013-12-23 DIAGNOSIS — Z791 Long term (current) use of non-steroidal anti-inflammatories (NSAID): Secondary | ICD-10-CM | POA: Diagnosis not present

## 2013-12-23 DIAGNOSIS — S62309A Unspecified fracture of unspecified metacarpal bone, initial encounter for closed fracture: Secondary | ICD-10-CM | POA: Diagnosis not present

## 2013-12-23 MED ORDER — OXYCODONE-ACETAMINOPHEN 5-325 MG PO TABS: 1.0000 | ORAL_TABLET | Freq: Four times a day (QID) | ORAL | Status: DC | PRN

## 2013-12-23 NOTE — ED Notes (Addendum)
Pt reports falling on Monday and fell on left hand crossing an intersection.  Pts hand is visibly swollen, no LOC, no care was sought on Monday. Pt has neuropathy and has not taken gabapentin.

## 2013-12-23 NOTE — ED Provider Notes (Signed)
CSN: [id number]     Arrival date & time 12/23/13  0740 History   First MD Initiated Contact with Patient 12/23/13 0813     Chief Complaint  Patient presents with  . Hand Injury     (Consider location/radiation/quality/duration/timing/severity/associated sxs/prior Treatment) Patient is a 58 y.o. female presenting with hand injury. The history is provided by the patient.  Hand Injury Location:  Hand Time since incident:  4 days Injury: yes   Mechanism of injury: fall   Fall:    Fall occurred: from standing.   Height of fall:  Standing   Impact surface:  Concrete   Point of impact: hand, knees, face.   Entrapped after fall: no   Hand location:  L hand Pain details:    Quality:  Aching   Radiates to:  Does not radiate   Severity:  Mild   Onset quality:  Sudden   Duration:  4 days   Timing:  Constant   Progression:  Unchanged Chronicity:  New Handedness:  Right-handed Dislocation: no   Foreign body present:  No foreign bodies Tetanus status:  Up to date Prior injury to area:  No Relieved by:  Nothing Worsened by:  Nothing tried Ineffective treatments:  NSAIDs Associated symptoms: no back pain, no fatigue, no fever and no neck pain     Past Medical History  Diagnosis Date  . 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 Hypertension   . Diverticulosis   . Radiation proctitis     mild  . Adenomatous colon polyp   . Anemia, unspecified   . Depression   . Nonspecific colitis 10/10/11  . Cocaine abuse     as recently as 10/21/11  . Hernia 03/18/2013  . Neuromuscular disorder   . 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 Incontinence of bowel     since colostomy  . History of transfusion   . Difficulty sleeping     takes meds to sleep   Past Surgical History  Procedure Laterality Date  . Mouth surgery  10/2009  . Low anterior bowel resection  03/07/09  . Colostomy  2013    [facility]  . Portacath placement      and removal  .  Laparoscopic parastomal hernia N/A 05/12/2013    Procedure: LAPAROSCOPIC PARASTOMAL HERNIA;  Surgeon: Leighton Ruff, MD;  Location: [facility];  Service: General;  Laterality: N/A;  . Hernia repair     Family History  Problem Relation Age of Onset  . Colon cancer Mother   . Stomach cancer Sister   . Colon polyps Sister   . Diabetes Father   . Cirrhosis Brother   . Pulmonary Hypertension Brother    History  Substance Use Topics  . Smoking status: Current Every Day Smoker -- 40 years    Types: Cigarettes  . Smokeless tobacco: Never Used     Comment: form given 04-15-12.divorced, lives with sister Willaim Rayas. Food Service at [employer]  . Alcohol Use: Yes     Comment: occasional  driink   OB History   Grav Para Term Preterm Abortions TAB SAB Ect Mult Living                 Review of Systems  Constitutional: Negative for fever and fatigue.  HENT: Negative for congestion and drooling.   Eyes: Negative for pain.  Respiratory: Negative for cough and shortness of breath.   Cardiovascular: Negative for  chest pain.  Gastrointestinal: Negative for nausea, vomiting, abdominal pain and diarrhea.  Genitourinary: Negative for dysuria and hematuria.  Musculoskeletal: Negative for back pain, gait problem and neck pain.  Skin: Negative for color change.  Neurological: Negative for dizziness and headaches.  Hematological: Negative for adenopathy.  Psychiatric/Behavioral: Negative for behavioral problems.  All other systems reviewed and are negative.     Allergies  Ampicillin; Hydrocodone; and Penicillins  Home Medications   Prior to Admission medications   Medication Sig Start Date End Date Taking? Authorizing Provider  ALPRAZolam Duanne Moron) 0.5 MG tablet TAKE ONE TABLET BY MOUTH AT BEDTIME AS NEEDED FOR ANXIETY OR  SLEEP 12/13/13   Biagio Borg, MD  amitriptyline (ELAVIL) 25 MG tablet Take 1 tablet (25 mg total) by mouth at bedtime as needed for sleep. 09/14/13   Rowe Clack, MD  citalopram (CELEXA) 20 MG tablet Take 1 tablet (20 mg total) by mouth every morning. 09/15/13   Rowe Clack, MD  diclofenac sodium (VOLTAREN) 1 % GEL Apply 2 g topically 4 (four) times daily. 11/07/13   Hendricks Limes, MD  gabapentin (NEURONTIN) 400 MG capsule Take 2 capsules (800 mg total) by mouth 3 (three) times daily. 09/14/13   Rowe Clack, MD  hydrochlorothiazide (HYDRODIURIL) 25 MG tablet Take 25 mg by mouth every morning.    Historical Provider, MD  lisinopril (PRINIVIL,ZESTRIL) 20 MG tablet Take 1 tablet (20 mg total) by mouth daily. Start if BP averages > 140/90 11/07/13   Hendricks Limes, MD   BP 175/79  Pulse 61  Temp(Src) 98.1 F (36.7 C) (Oral)  Resp 16  SpO2 100% Physical Exam  Nursing note and vitals reviewed. Constitutional: She is oriented to person, place, and time. She appears well-developed and well-nourished.  HENT:  Head: Normocephalic.  Mouth/Throat: Oropharynx is clear and moist. No oropharyngeal exudate.  Mild abrasion to left lateral nare. Mild abrasion to left maxillary area.  Eyes: Conjunctivae and EOM are normal. Pupils are equal, round, and reactive to light.  Neck: Normal range of motion. Neck supple.  No vertebral ttp.   Cardiovascular: Normal rate, regular rhythm, normal heart sounds and intact distal pulses.  Exam reveals no gallop and no friction rub.   No murmur heard. Pulmonary/Chest: Effort normal and breath sounds normal. No respiratory distress. She has no wheezes.  Abdominal: Soft. Bowel sounds are normal. There is no tenderness. There is no rebound and no guarding.  Musculoskeletal: Normal range of motion. She exhibits no edema and no tenderness.  Mild abrasions to anterior bilateral knees.  Mild to moderate swelling mostly localized to the ulnar aspect of the left hand and the fourth and fifth digits of the left hand.  Normal sensation of the left upper extremity.  2+ radial and ulnar pulses in the left upper  extremity.  Mildly limited range of motion of the fourth and fifth digit of the left hand due to pain. Otherwise normal motor skills of the left hand.  Neurological: She is alert and oriented to person, place, and time.  Skin: Skin is warm and dry.  Psychiatric: She has a normal mood and affect. Her behavior is normal.    ED Course  Procedures (including critical care time) Labs Review Labs Reviewed - No data to display  Imaging Review Dg Hand Complete Left  12/23/2013   CLINICAL DATA:  Recent fall with injury  EXAM: LEFT HAND - COMPLETE 3+ VIEW  COMPARISON:  None.  FINDINGS: There is a  transverse fracture through the base of the fourth and fifth proximal phalanges. There is also a mildly displaced fracture through the base of the fifth metacarpal. No other fractures are identified.  IMPRESSION: Fractures of the fifth metacarpal as well as the fourth and fifth proximal phalanges.   Electronically Signed   By: Inez Catalina M.D.   On: 12/23/2013 08:31     EKG Interpretation None      MDM   Final diagnoses:  Fracture of fifth metacarpal bone of left hand, closed, initial encounter  Fracture of proximal phalanx of finger, closed, initial encounter    8:20 AM 58 y.o. female who presents with a mechanical fall which occurred 4 days ago. She fell onto concrete at a bus stop hitting her left hand , bilateral knees, and the left side of her face. She denies loss of consciousness. She denies any headache or neck pain. She currently complains of pain only in her left hand. She is right-handed. Will get plain film of left hand.  9:07 AM: Found to have fx's noted above. Will place in ulnar gutter.  I have discussed the diagnosis/risks/treatment options with the patient and believe the pt to be eligible for discharge home to follow-up with Dr. Burney Gauze (hand). We also discussed returning to the ED immediately if new or worsening sx occur. We discussed the sx which are most concerning (e.g.,  worsening pain, fever) that necessitate immediate return. Medications administered to the patient during their visit and any new prescriptions provided to the patient are listed below.  Medications given during this visit Medications - No data to display  New Prescriptions   OXYCODONE-ACETAMINOPHEN (PERCOCET) 5-325 MG PER TABLET    Take 1 tablet by mouth every 6 (six) hours as needed for moderate pain.     Pamella Pert, MD 12/23/13 [phone]

## 2013-12-23 NOTE — ED Notes (Addendum)
Ortho tech at bedside 

## 2013-12-23 NOTE — Discharge Instructions (Signed)

## 2013-12-27 DIAGNOSIS — IMO0002 Reserved for concepts with insufficient information to code with codable children: Secondary | ICD-10-CM | POA: Diagnosis not present

## 2013-12-27 DIAGNOSIS — S62339A Displaced fracture of neck of unspecified metacarpal bone, initial encounter for closed fracture: Secondary | ICD-10-CM | POA: Diagnosis not present

## 2013-12-28 DIAGNOSIS — Y999 Unspecified external cause status: Secondary | ICD-10-CM | POA: Diagnosis not present

## 2013-12-28 DIAGNOSIS — S62329A Displaced fracture of shaft of unspecified metacarpal bone, initial encounter for closed fracture: Secondary | ICD-10-CM | POA: Diagnosis not present

## 2013-12-28 DIAGNOSIS — S62319A Displaced fracture of base of unspecified metacarpal bone, initial encounter for closed fracture: Secondary | ICD-10-CM | POA: Diagnosis not present

## 2013-12-28 DIAGNOSIS — Y939 Activity, unspecified: Secondary | ICD-10-CM | POA: Diagnosis not present

## 2013-12-28 DIAGNOSIS — IMO0002 Reserved for concepts with insufficient information to code with codable children: Secondary | ICD-10-CM | POA: Diagnosis not present

## 2013-12-28 DIAGNOSIS — Y929 Unspecified place or not applicable: Secondary | ICD-10-CM | POA: Diagnosis not present

## 2013-12-28 DIAGNOSIS — X58XXXA Exposure to other specified factors, initial encounter: Secondary | ICD-10-CM | POA: Diagnosis not present

## 2013-12-29 ENCOUNTER — Telehealth: Payer: Self-pay | Admitting: Internal Medicine

## 2013-12-29 NOTE — Telephone Encounter (Signed)
Patient had to cancel appt with Dr. Tamala Julian for 9/18 b.c she fell on Labor Day and broke 3 bones in her hand.  She had surgery on 9/16 and is in recovery.  She just wanted to inform Dr.Smith and Dr. Asa Lente.  Thanks!

## 2013-12-29 NOTE — Telephone Encounter (Signed)
Noted  

## 2013-12-30 ENCOUNTER — Ambulatory Visit: Payer: Medicare Other | Admitting: Family Medicine

## 2014-01-02 DIAGNOSIS — S62329A Displaced fracture of shaft of unspecified metacarpal bone, initial encounter for closed fracture: Secondary | ICD-10-CM | POA: Diagnosis not present

## 2014-01-02 DIAGNOSIS — IMO0002 Reserved for concepts with insufficient information to code with codable children: Secondary | ICD-10-CM | POA: Diagnosis not present

## 2014-01-05 ENCOUNTER — Telehealth: Payer: Self-pay | Admitting: Internal Medicine

## 2014-01-05 ENCOUNTER — Telehealth: Payer: Self-pay | Admitting: *Deleted

## 2014-01-05 NOTE — Telephone Encounter (Signed)
Spoke with supplier and gave verbal order to increase deodorant. He will fax order to be signed.

## 2014-01-05 NOTE — Telephone Encounter (Signed)
Left msg on triage stating pt is requesting osteomy bags to be increase from 6 oz to 16 oz. Called adrian back no answer LMOM will have to contact pt GI md " [name]" she is the one ok orders...[name]

## 2014-01-16 ENCOUNTER — Encounter (HOSPITAL_COMMUNITY): Payer: Self-pay | Admitting: Emergency Medicine

## 2014-01-16 ENCOUNTER — Emergency Department (HOSPITAL_COMMUNITY)
Admission: EM | Admit: 2014-01-16 | Discharge: 2014-01-16 | Disposition: A | Discharge status: Home / Self Care | Payer: Medicare Other | Attending: Emergency Medicine | Admitting: Emergency Medicine

## 2014-01-16 ENCOUNTER — Emergency Department (HOSPITAL_COMMUNITY): Payer: Medicare Other

## 2014-01-16 DIAGNOSIS — Y9289 Other specified places as the place of occurrence of the external cause: Secondary | ICD-10-CM | POA: Diagnosis not present

## 2014-01-16 DIAGNOSIS — Z8719 Personal history of other diseases of the digestive system: Secondary | ICD-10-CM | POA: Insufficient documentation

## 2014-01-16 DIAGNOSIS — Z8669 Personal history of other diseases of the nervous system and sense organs: Secondary | ICD-10-CM | POA: Diagnosis not present

## 2014-01-16 DIAGNOSIS — F329 Major depressive disorder, single episode, unspecified: Secondary | ICD-10-CM | POA: Diagnosis not present

## 2014-01-16 DIAGNOSIS — Z86010 Personal history of colonic polyps: Secondary | ICD-10-CM | POA: Insufficient documentation

## 2014-01-16 DIAGNOSIS — Z79899 Other long term (current) drug therapy: Secondary | ICD-10-CM | POA: Insufficient documentation

## 2014-01-16 DIAGNOSIS — M25569 Pain in unspecified knee: Secondary | ICD-10-CM | POA: Diagnosis not present

## 2014-01-16 DIAGNOSIS — Z862 Personal history of diseases of the blood and blood-forming organs and certain disorders involving the immune mechanism: Secondary | ICD-10-CM | POA: Diagnosis not present

## 2014-01-16 DIAGNOSIS — S8982XA Other specified injuries of left lower leg, initial encounter: Secondary | ICD-10-CM | POA: Diagnosis present

## 2014-01-16 DIAGNOSIS — R404 Transient alteration of awareness: Secondary | ICD-10-CM | POA: Diagnosis not present

## 2014-01-16 DIAGNOSIS — S82142A Displaced bicondylar fracture of left tibia, initial encounter for closed fracture: Secondary | ICD-10-CM | POA: Diagnosis not present

## 2014-01-16 DIAGNOSIS — Z85048 Personal history of other malignant neoplasm of rectum, rectosigmoid junction, and anus: Secondary | ICD-10-CM | POA: Diagnosis not present

## 2014-01-16 DIAGNOSIS — W07XXXA Fall from chair, initial encounter: Secondary | ICD-10-CM | POA: Insufficient documentation

## 2014-01-16 DIAGNOSIS — Z880 Allergy status to penicillin: Secondary | ICD-10-CM | POA: Insufficient documentation

## 2014-01-16 DIAGNOSIS — Y9389 Activity, other specified: Secondary | ICD-10-CM | POA: Diagnosis not present

## 2014-01-16 DIAGNOSIS — Z720 Tobacco use: Secondary | ICD-10-CM | POA: Diagnosis not present

## 2014-01-16 DIAGNOSIS — M25562 Pain in left knee: Secondary | ICD-10-CM | POA: Insufficient documentation

## 2014-01-16 DIAGNOSIS — R531 Weakness: Secondary | ICD-10-CM | POA: Diagnosis not present

## 2014-01-16 DIAGNOSIS — S82102A Unspecified fracture of upper end of left tibia, initial encounter for closed fracture: Secondary | ICD-10-CM | POA: Diagnosis not present

## 2014-01-16 DIAGNOSIS — I10 Essential (primary) hypertension: Secondary | ICD-10-CM | POA: Diagnosis not present

## 2014-01-16 MED ORDER — OXYCODONE-ACETAMINOPHEN 5-325 MG PO TABS: 1.0000 | ORAL_TABLET | ORAL | Status: DC | PRN

## 2014-01-16 NOTE — Discharge Instructions (Signed)
Take Percocet as needed for pain. Rest, ice and elevate for pain and swelling relief. Follow up with Dr. Latanya Maudlin for further evaluation and management of your fracture. Refer to attached documents for more information.

## 2014-01-16 NOTE — ED Notes (Signed)
Pt states she fell out of a chair approx. 4 hours ago and is now c/o L knee pain. Alert and oriented.

## 2014-01-16 NOTE — ED Notes (Signed)
Pt states that she fell out of a chair helping her neighbor get a limb, she fell on her left knee

## 2014-01-16 NOTE — ED Provider Notes (Signed)
CSN: [id number]     Arrival date & time 01/16/14  1945 History  This chart was scribed for non-physician practitioner working with Debby Freiberg, MD by Mercy Moore, ED Scribe. This patient was seen in room WTR5/WTR5 and the patient's care was started at 9:12 PM.   Chief Complaint  Patient presents with  . Knee Pain   The history is provided by the patient. No language interpreter was used.   HPI Comments: [name] is a 58 y.o. female who presents to the Emergency Department complaining of left lower leg pain after falling from a chair today. Patient reports attempting to assist neighbor in reaching broken tree limb on a cable. Due to ground saturation from the rain, patient states that the chair gave way and she fell directly on her left knee. Patient denies head injury or loss of consciousness due to her falling.  Patient states that she has been unable to walk on the leg due to pain severity. Patient reports alleviation with standing on the leg laterally rotated.  Past Medical History  Diagnosis Date  . 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 Hypertension   . Diverticulosis   . Radiation proctitis     mild  . Adenomatous colon polyp   . Anemia, unspecified   . Depression   . Nonspecific colitis 10/10/11  . Cocaine abuse     as recently as 10/21/11  . Hernia 03/18/2013  . Neuromuscular disorder   . 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 Incontinence of bowel     since colostomy  . History of transfusion   . Difficulty sleeping     takes meds to sleep   Past Surgical History  Procedure Laterality Date  . Mouth surgery  10/2009  . Low anterior bowel resection  03/07/09  . Colostomy  2013    [facility]  . Portacath placement      and removal  . Laparoscopic parastomal hernia N/A 05/12/2013    Procedure: LAPAROSCOPIC PARASTOMAL HERNIA;  Surgeon: Leighton Ruff, MD;  Location: [facility];  Service: General;  Laterality: N/A;  . Hernia repair      Family History  Problem Relation Age of Onset  . Colon cancer Mother   . Stomach cancer Sister   . Colon polyps Sister   . Diabetes Father   . Cirrhosis Brother   . Pulmonary Hypertension Brother    History  Substance Use Topics  . Smoking status: Current Every Day Smoker -- 40 years    Types: Cigarettes  . Smokeless tobacco: Never Used     Comment: form given 04-15-12.divorced, lives with sister Willaim Rayas. Food Service at [employer]  . Alcohol Use: Yes     Comment: occasional  driink   OB History   Grav Para Term Preterm Abortions TAB SAB Ect Mult Living                 Review of Systems  Constitutional: Negative for fever and chills.  Musculoskeletal: Positive for arthralgias. Negative for gait problem and joint swelling.  Neurological: Negative for weakness and numbness.  All other systems reviewed and are negative.   Allergies  Ampicillin; Hydrocodone; Penicillins; and Grapeseed extract  Home Medications   Prior to Admission medications   Medication Sig Start Date End Date Taking? Authorizing Provider  acetaminophen (TYLENOL) 500 MG tablet Take 1,000 mg by mouth every 6 (six) hours as needed (pain).  Historical Provider, MD  ALPRAZolam Duanne Moron) 0.5 MG tablet Take 0.5 mg by mouth at bedtime as needed for anxiety or sleep.    Historical Provider, MD  amitriptyline (ELAVIL) 25 MG tablet Take 1 tablet (25 mg total) by mouth at bedtime as needed for sleep. 09/14/13   Rowe Clack, MD  citalopram (CELEXA) 20 MG tablet Take 1 tablet (20 mg total) by mouth every morning. 09/15/13   Rowe Clack, MD  gabapentin (NEURONTIN) 400 MG capsule Take 2 capsules (800 mg total) by mouth 3 (three) times daily. 09/14/13   Rowe Clack, MD  hydrochlorothiazide (HYDRODIURIL) 25 MG tablet Take 25 mg by mouth every morning.    Historical Provider, MD  oxyCODONE-acetaminophen (PERCOCET) 5-325 MG per tablet Take 1 tablet by mouth every 6 (six) hours as needed for  moderate pain. 12/23/13   Pamella Pert, MD   Triage Vitals: BP 132/88  Pulse 95  Temp(Src) 98.2 F (36.8 C) (Oral)  Resp 16  SpO2 97%  Physical Exam  Nursing note and vitals reviewed. Constitutional: She is oriented to person, place, and time. She appears well-developed and well-nourished. No distress.  HENT:  Head: Normocephalic and atraumatic.  Eyes: EOM are normal.  Neck: Neck supple.  Cardiovascular: Normal rate.   Pulmonary/Chest: Effort normal. No respiratory distress.  Musculoskeletal: Normal range of motion. She exhibits tenderness.  Left knee: tenderness to palpation over medial joint line with associated generalized edema. Limited ROM due to pain. No obvious deformity.   Neurological: She is alert and oriented to person, place, and time.  Skin: Skin is warm and dry.  Psychiatric: She has a normal mood and affect. Her behavior is normal.    ED Course  Procedures (including critical care time)  COORDINATION OF CARE: 9:22 PM- Will order splint for knee and prescribe pain medication. Discussed treatment plan with patient at bedside and patient agreed to plan.   Labs Review Labs Reviewed - No data to display  Imaging Review Dg Knee Complete 4 Views Left  01/16/2014   CLINICAL DATA:  Status post fall at home striking the knee  EXAM: LEFT KNEE - COMPLETE 4+ VIEW  COMPARISON:  None.  FINDINGS: There is subtle heterogeneous density projecting over the lateral tibial plateau worrisome for mildly depressed tibial plateau fracture. The adjacent fibula is intact. The distal femur and patella are intact. A knee joint effusion is suspected.  IMPRESSION: A mildly depressed lateral tibial plateau fracture is present.   Electronically Signed   By: David  Martinique   On: 01/16/2014 28:31   SPLINT APPLICATION Date/Time: 5:17 PM Authorized by: Alvina Chou Consent: Verbal consent obtained. Risks and benefits: risks, benefits and alternatives were discussed Consent given by:  patient Splint applied by: orthopedic technician Location details: left knee Splint type: knee immobilizer Supplies used: knee immobilizer Post-procedure: The splinted body part was neurovascularly unchanged following the procedure. Patient tolerance: Patient tolerated the procedure well with no immediate complications.    EKG Interpretation None      MDM   Final diagnoses:  Tibial plateau fracture, left, closed, initial encounter   9:36 PM Patient's xray shows mildly depressed tibial plateau fracture. No neurovascular compromise. Patient will have knee immobilizer place and recommended Ortho follow up. No other injuries. Vitals stable and patient afebrile.   I personally performed the services described in this documentation, which was scribed in my presence. The recorded information has been reviewed and is accurate.    Alvina Chou, [place] 01/16/14 2333

## 2014-01-19 ENCOUNTER — Telehealth: Payer: Self-pay | Admitting: Internal Medicine

## 2014-01-19 ENCOUNTER — Ambulatory Visit (INDEPENDENT_AMBULATORY_CARE_PROVIDER_SITE_OTHER): Payer: Medicare Other | Admitting: Family Medicine

## 2014-01-19 ENCOUNTER — Other Ambulatory Visit (INDEPENDENT_AMBULATORY_CARE_PROVIDER_SITE_OTHER): Payer: Medicare Other

## 2014-01-19 ENCOUNTER — Encounter: Payer: Self-pay | Admitting: Family Medicine

## 2014-01-19 VITALS — BP 134/88 | HR 95 | Ht 66.0 in

## 2014-01-19 DIAGNOSIS — S62615D Displaced fracture of proximal phalanx of left ring finger, subsequent encounter for fracture with routine healing: Secondary | ICD-10-CM | POA: Diagnosis not present

## 2014-01-19 DIAGNOSIS — M25511 Pain in right shoulder: Secondary | ICD-10-CM | POA: Diagnosis not present

## 2014-01-19 DIAGNOSIS — R296 Repeated falls: Secondary | ICD-10-CM

## 2014-01-19 DIAGNOSIS — F32A Depression, unspecified: Secondary | ICD-10-CM

## 2014-01-19 DIAGNOSIS — M7551 Bursitis of right shoulder: Secondary | ICD-10-CM

## 2014-01-19 DIAGNOSIS — S62316D Displaced fracture of base of fifth metacarpal bone, right hand, subsequent encounter for fracture with routine healing: Secondary | ICD-10-CM | POA: Diagnosis not present

## 2014-01-19 DIAGNOSIS — S82142A Displaced bicondylar fracture of left tibia, initial encounter for closed fracture: Secondary | ICD-10-CM

## 2014-01-19 DIAGNOSIS — F329 Major depressive disorder, single episode, unspecified: Secondary | ICD-10-CM

## 2014-01-19 DIAGNOSIS — S62614D Displaced fracture of proximal phalanx of right ring finger, subsequent encounter for fracture with routine healing: Secondary | ICD-10-CM | POA: Diagnosis not present

## 2014-01-19 HISTORY — DX: Displaced bicondylar fracture of left tibia, initial encounter for closed fracture: S82.142A

## 2014-01-19 NOTE — Assessment & Plan Note (Signed)
Patient does have what appears to be a very small moderately depressed tibial plateau fracture. We will monitor this. Patient has no signs of vascular compromise at this time. Patient will continue to be in the knee immobilizer for the next 10 days and likely a total of 14 days. Patient then likely see some change for the better on x-ray patient will be able to start doing some range of motion exercises but will continue to avoid weightbearing for a total of 4-6 weeks likely. We discussed an icing regimen at this time. Depending on the amount of swelling at followup we may need aspiration as well. Due to patient's history of cancer he may also want to consider a CT scan. This will evaluate the bone in greater detail.

## 2014-01-19 NOTE — Assessment & Plan Note (Signed)
Patient states that she has stopped all her depressive medications patient did not want to discuss this.

## 2014-01-19 NOTE — Patient Instructions (Signed)
Good to meet you I recommend trying to decrease the gabapentin because this can make you fall.  Vitamin D 2000 IU daily B12 1070mcg daily B6 200mg  daily Stay in brace another 10 days and avoid excessive walking on knee.  Ice 20 minutes 2 times a day Good luck with the hand.  See me again in 10 days.

## 2014-01-19 NOTE — Progress Notes (Signed)
[name] [name], [name] [address] Phone: [phone] Subjective:    I'm seeing this patient by the request  of:  Gwendolyn Grant, MD   CC: left knee and right shoulder.   ION:[id number] [name] is a 58 y.o. female coming in with complaint of right shoulder and left knee pain.   Patient does have right shoulder pain and has had for quite some time. Patient states her in reaching across her body she does have some mild discomfort. Patient unfortunately continues to have difficulty and has had these are more secondary to recent falls. Patient did hurt the shoulder when she fell as well. Denies any weakness but states that when she rolls onto her right side when sleeping she has difficulty. Patient has been taking oxycodone as well as gabapentin on a regular basis. Patient is up to 800 mg of gabapentin 3 times a day. Does wake her up at night.  States that the severity is 10 out of 10.  Patient also is having left knee pain. Patient did fall directly onto her knee went to try and help a neighbor get a branch off of a wire. Patient was seen in the emergency department and x-rays were done. Questionable tibial plateau fracture seen. Patient was put in a knee immobilizer and states that she continues to have swelling. Patient has been attempting to not use the crutches and try to put some weight on it because patient has difficulty secondary to having surgery recently for a hand fracture.       Past medical history, social, surgical and family history all reviewed in electronic medical record.   Review of Systems: No headache, visual changes, nausea, vomiting, diarrhea, constipation, dizziness, abdominal pain, skin rash, fevers, chills, night sweats, weight loss, swollen lymph nodes, body aches, joint swelling, muscle aches, chest pain, shortness of breath, mood changes.   Objective Blood pressure 134/88, pulse 95, height 5\' 6"  (1.676 m),  SpO2 97.00%.  General: Patient is moderately anxious and dresses disheveled HEENT: Pupils pinpoint and patient is slurring her words, extraocular movements intact  Respiratory: Patient's speak in full sentences and does not appear short of breath  Cardiovascular: No lower extremity edema, non tender, no erythema  Skin: Warm dry intact with no signs of infection or rash on extremities or on axial skeleton.  Abdomen: Soft nontender  Neuro: Cranial nerves II through XII are intact, neurovascularly intact in all extremities with 2+ DTRs and 2+ pulses.  Lymph: No lymphadenopathy of posterior or anterior cervical chain or axillae bilaterally.  Gait normal with good balance and coordination.  MSK:  Non tender with full range of motion and good stability and symmetric strength and tone of  elbows, wrist, hip,  and ankles bilaterally. Patient's right wrist patient has an ulnar gutter splint and unable to fully Stanley. Shoulder: Right Inspection reveals no abnormalities, atrophy or asymmetry. Palpation is normal with no tenderness over AC joint or bicipital groove. ROM is full in all planes passively. Rotator cuff strength normal throughout. signs of impingement with positive Neer and Hawkin's tests, but negative empty can sign. Speeds and Yergason's tests normal. No labral pathology noted with negative Obrien's, negative clunk and good stability. Normal scapular function observed. No painful arc and no drop arm sign. No apprehension sign  Knee: Left 1+ effusion. Patient is severely tender and generalized. Did not do significant range of motion testing secondary to pain Neurovascularly intact distally.  MSK US performed  of: Right This study was ordered, performed, and interpreted by Charlann Boxer D.O.  Shoulder:   Supraspinatus:  Appears normal on long and transverse views, Bursal bulge seen with shoulder abduction on impingement view. Infraspinatus:  Appears normal on long and transverse views.  Significant increase in Doppler flow Subscapularis:  Appears normal on long and transverse views. Positive bursa Teres Minor:  Appears normal on long and transverse views. AC joint:  Capsule undistended, no geyser sign. Glenohumeral Joint:  Appears normal without effusion. Glenoid Labrum:  Intact without visualized tears. Biceps Tendon:  Appears normal on long and transverse views, no fraying of tendon, tendon located in intertubercular groove, no subluxation with shoulder internal or external rotation.  Impression: Subacromial bursitis  Procedure: Real-time Ultrasound Guided Injection of right glenohumeral joint Device: GE Logiq E  Ultrasound guided injection is preferred based studies that show increased duration, increased effect, greater accuracy, decreased procedural pain, increased response rate with ultrasound guided versus blind injection.  Verbal informed consent obtained.  Time-out conducted.  Noted no overlying erythema, induration, or other signs of local infection.  Skin prepped in a sterile fashion.  Local anesthesia: Topical Ethyl chloride.  With sterile technique and under real time ultrasound guidance:  Joint visualized.  23g 1  inch needle inserted posterior approach. Pictures taken for needle placement. Patient did have injection of 2 cc of 1% lidocaine, 2 cc of 0.5% Marcaine, and 1.0 cc of Kenalog 40 mg/dL. Completed without difficulty  Pain immediately resolved suggesting accurate placement of the medication.  Advised to call if fevers/chills, erythema, induration, drainage, or persistent bleeding.  Images permanently stored and available for review in the ultrasound unit.  Impression: Technically successful ultrasound guided injection.     Impression and Recommendations:     This case required medical decision making of moderate complexity.

## 2014-01-19 NOTE — ED Provider Notes (Signed)
Medical screening examination/treatment/procedure(s) were performed by non-physician practitioner and as supervising physician I was immediately available for consultation/collaboration.   EKG Interpretation None        Debby Freiberg, MD 01/19/14 [phone]

## 2014-01-19 NOTE — Assessment & Plan Note (Signed)
Patient has had multiple falls and I think patient is likely overmedicated. There is a chance that this is somewhat weakness and some of a peripheral neuropathy as well. We discussed over-the-counter medications he can be beneficial for her peripheral neuropathy. Patient will come back and see me again in and 3-4 weeks. Patient was adamant that she does not overly using the pain medication significant think she needs more she states. Also was very adamant not to decrease her gabapentin at this time.

## 2014-01-19 NOTE — Assessment & Plan Note (Signed)
Patient was given injection today with good resolution of pain. We discussed an icing regimen. I do think is likely some underlying osteophytic changes that can be contributing this as well. Patient continues to fall and I think that some main concern. Patient has stopped all antidepressant medications and is doing chronic narcotics the patient had a history of cocaine abuse. Patient did have difficulty with is slurring her words today as well as pinpoint pupils which makes me very concern. Discussed about decreasing patient's pain medication as well as her gabapentin which patient declined. Will talk to her PCP.

## 2014-01-20 ENCOUNTER — Telehealth: Payer: Self-pay | Admitting: Internal Medicine

## 2014-01-20 NOTE — Telephone Encounter (Signed)
emmi mailed  °

## 2014-01-23 DIAGNOSIS — S82142D Displaced bicondylar fracture of left tibia, subsequent encounter for closed fracture with routine healing: Secondary | ICD-10-CM | POA: Diagnosis not present

## 2014-01-25 ENCOUNTER — Ambulatory Visit: Payer: Medicare Other | Admitting: Family Medicine

## 2014-01-31 ENCOUNTER — Ambulatory Visit: Payer: Medicare Other | Admitting: Family Medicine

## 2014-01-31 DIAGNOSIS — Z0289 Encounter for other administrative examinations: Secondary | ICD-10-CM

## 2014-02-03 ENCOUNTER — Emergency Department (HOSPITAL_COMMUNITY): Payer: Medicare Other

## 2014-02-03 ENCOUNTER — Telehealth: Payer: Self-pay | Admitting: *Deleted

## 2014-02-03 ENCOUNTER — Encounter (HOSPITAL_COMMUNITY): Payer: Self-pay | Admitting: Emergency Medicine

## 2014-02-03 ENCOUNTER — Emergency Department (HOSPITAL_COMMUNITY)
Admission: EM | Admit: 2014-02-03 | Discharge: 2014-02-03 | Disposition: A | Discharge status: Home / Self Care | Payer: Medicare Other | Attending: Emergency Medicine | Admitting: Emergency Medicine

## 2014-02-03 DIAGNOSIS — Z8659 Personal history of other mental and behavioral disorders: Secondary | ICD-10-CM | POA: Diagnosis not present

## 2014-02-03 DIAGNOSIS — Z720 Tobacco use: Secondary | ICD-10-CM | POA: Diagnosis not present

## 2014-02-03 DIAGNOSIS — G479 Sleep disorder, unspecified: Secondary | ICD-10-CM | POA: Diagnosis not present

## 2014-02-03 DIAGNOSIS — G629 Polyneuropathy, unspecified: Secondary | ICD-10-CM | POA: Diagnosis not present

## 2014-02-03 DIAGNOSIS — N202 Calculus of kidney with calculus of ureter: Secondary | ICD-10-CM | POA: Diagnosis not present

## 2014-02-03 DIAGNOSIS — Z8719 Personal history of other diseases of the digestive system: Secondary | ICD-10-CM | POA: Diagnosis not present

## 2014-02-03 DIAGNOSIS — N23 Unspecified renal colic: Secondary | ICD-10-CM | POA: Diagnosis not present

## 2014-02-03 DIAGNOSIS — Z79899 Other long term (current) drug therapy: Secondary | ICD-10-CM | POA: Diagnosis not present

## 2014-02-03 DIAGNOSIS — R1111 Vomiting without nausea: Secondary | ICD-10-CM | POA: Diagnosis not present

## 2014-02-03 DIAGNOSIS — K2970 Gastritis, unspecified, without bleeding: Secondary | ICD-10-CM | POA: Diagnosis not present

## 2014-02-03 DIAGNOSIS — Z862 Personal history of diseases of the blood and blood-forming organs and certain disorders involving the immune mechanism: Secondary | ICD-10-CM | POA: Diagnosis not present

## 2014-02-03 DIAGNOSIS — Z85048 Personal history of other malignant neoplasm of rectum, rectosigmoid junction, and anus: Secondary | ICD-10-CM | POA: Diagnosis not present

## 2014-02-03 DIAGNOSIS — N201 Calculus of ureter: Secondary | ICD-10-CM | POA: Diagnosis not present

## 2014-02-03 DIAGNOSIS — Z86010 Personal history of colonic polyps: Secondary | ICD-10-CM | POA: Insufficient documentation

## 2014-02-03 DIAGNOSIS — Z880 Allergy status to penicillin: Secondary | ICD-10-CM | POA: Insufficient documentation

## 2014-02-03 DIAGNOSIS — N138 Other obstructive and reflux uropathy: Secondary | ICD-10-CM | POA: Diagnosis not present

## 2014-02-03 DIAGNOSIS — R1031 Right lower quadrant pain: Secondary | ICD-10-CM | POA: Diagnosis not present

## 2014-02-03 DIAGNOSIS — I10 Essential (primary) hypertension: Secondary | ICD-10-CM | POA: Diagnosis not present

## 2014-02-03 DIAGNOSIS — R109 Unspecified abdominal pain: Secondary | ICD-10-CM

## 2014-02-03 LAB — CBC WITH DIFFERENTIAL/PLATELET
Basophils Absolute: 0 10*3/uL (ref 0.0–0.1)
Basophils Relative: 0 % (ref 0–1)
Eosinophils Absolute: 0.1 10*3/uL (ref 0.0–0.7)
Eosinophils Relative: 1 % (ref 0–5)
HCT: 38 % (ref 36.0–46.0)
Hemoglobin: 12.5 g/dL (ref 12.0–15.0)
Lymphocytes Relative: 14 % (ref 12–46)
Lymphs Abs: 1.5 10*3/uL (ref 0.7–4.0)
MCH: 29.6 pg (ref 26.0–34.0)
MCHC: 32.9 g/dL (ref 30.0–36.0)
MCV: 89.8 fL (ref 78.0–100.0)
Monocytes Absolute: 0.7 10*3/uL (ref 0.1–1.0)
Monocytes Relative: 6 % (ref 3–12)
Neutro Abs: 8.4 10*3/uL — ABNORMAL HIGH (ref 1.7–7.7)
Neutrophils Relative %: 79 % — ABNORMAL HIGH (ref 43–77)
Platelets: 408 10*3/uL — ABNORMAL HIGH (ref 150–400)
RBC: 4.23 MIL/uL (ref 3.87–5.11)
RDW: 14.6 % (ref 11.5–15.5)
WBC: 10.6 10*3/uL — ABNORMAL HIGH (ref 4.0–10.5)

## 2014-02-03 LAB — COMPREHENSIVE METABOLIC PANEL
ALT: 23 U/L (ref 0–35)
AST: 25 U/L (ref 0–37)
Albumin: 3.4 g/dL — ABNORMAL LOW (ref 3.5–5.2)
Alkaline Phosphatase: 191 U/L — ABNORMAL HIGH (ref 39–117)
Anion gap: 16 — ABNORMAL HIGH (ref 5–15)
BUN: 16 mg/dL (ref 6–23)
CO2: 18 mEq/L — ABNORMAL LOW (ref 19–32)
Calcium: 9.3 mg/dL (ref 8.4–10.5)
Chloride: 104 mEq/L (ref 96–112)
Creatinine, Ser: 0.79 mg/dL (ref 0.50–1.10)
GFR calc Af Amer: >90 mL/min (ref >90)
GFR calc non Af Amer: >90 mL/min (ref >90)
Glucose, Bld: 124 mg/dL — ABNORMAL HIGH (ref 70–99)
Potassium: 3.8 mEq/L (ref 3.7–5.3)
Sodium: 138 mEq/L (ref 137–147)
Total Bilirubin: 0.5 mg/dL (ref 0.3–1.2)
Total Protein: 7.2 g/dL (ref 6.0–8.3)

## 2014-02-03 LAB — URINALYSIS, ROUTINE W REFLEX MICROSCOPIC
Bilirubin Urine: NEGATIVE
Glucose, UA: NEGATIVE mg/dL
Ketones, ur: NEGATIVE mg/dL
Leukocytes, UA: NEGATIVE
Nitrite: NEGATIVE
Protein, ur: NEGATIVE mg/dL
Specific Gravity, Urine: 1.012 (ref 1.005–1.030)
Urobilinogen, UA: 0.2 mg/dL (ref 0.0–1.0)
pH: 5 (ref 5.0–8.0)

## 2014-02-03 LAB — URINE MICROSCOPIC-ADD ON

## 2014-02-03 LAB — LIPASE, BLOOD: Lipase: 33 U/L (ref 11–59)

## 2014-02-03 MED ORDER — SODIUM CHLORIDE 0.9 % IV BOLUS (SEPSIS)
500.0000 mL | Freq: Once | INTRAVENOUS | Status: AC
  Administered 2014-02-03: 500 mL via INTRAVENOUS

## 2014-02-03 MED ORDER — ONDANSETRON HCL 4 MG/2ML IJ SOLN
4.0000 mg | Freq: Once | INTRAMUSCULAR | Status: AC
  Administered 2014-02-03: 4 mg via INTRAVENOUS
  Filled 2014-02-03: qty 2

## 2014-02-03 MED ORDER — OXYCODONE-ACETAMINOPHEN 5-325 MG PO TABS
1.0000 | ORAL_TABLET | Freq: Once | ORAL | Status: AC
  Administered 2014-02-03: 1 via ORAL
  Filled 2014-02-03: qty 1

## 2014-02-03 MED ORDER — FENTANYL CITRATE 0.05 MG/ML IJ SOLN
100.0000 ug | Freq: Once | INTRAMUSCULAR | Status: AC
  Administered 2014-02-03: 100 ug via INTRAVENOUS
  Filled 2014-02-03: qty 2

## 2014-02-03 NOTE — ED Provider Notes (Signed)
Medical screening examination/treatment/procedure(s) were performed by non-physician practitioner and as supervising physician I was immediately available for consultation/collaboration.   EKG Interpretation None        Ernestina Patches, MD 02/03/14 1644

## 2014-02-03 NOTE — ED Notes (Signed)
Per EMS pt coming from home with c/o RLQ and right flank pain since last night. Also reports nausea, no vomiting. Pain is 6/10

## 2014-02-03 NOTE — Telephone Encounter (Signed)
[facility] Triage Call Report Triage Record Num: [id number] Operator: Doug Sou Patient Name: [name] Call Date & Time: 02/03/2014 5:05:00AM Patient Phone: [phone] PCP: Gwendolyn Grant Patient Gender: Female PCP Fax : [phone] Patient DOB: February 14, 1956 Practice Name: [facility] Reason for Call: LMP was 30 years ago. Caller: [name]/Patient; PCP: Gwendolyn Grant (Adults only); CB#: [phone]; Call regarding pain on right side of abdomen and radiates to the pelvic area and to the back. States the pain is a deep, boring or tearing pain. Started at 10 pm on 02/03/14. Has been nauseated. Triaged per Abdominal Pain guideline. To see in ED immediately due to pain described as deep, boring or tearing. Care advice given. Advised ED and will go to [facility]. Protocol(s) Used: Abdominal Pain Recommended Outcome per Protocol: See ED Immediately Reason for Outcome: Pain described as deep, boring, or tearing Care Advice: ~ Another adult should drive. ~ Do not give the patient anything to eat or drink. ~ Do not push on abdomen. ~ IMMEDIATE ACTION Write down provider's name. List or place the following in a bag for transport with the patient: current prescription and/or nonprescription medications; alternative treatments, therapies and medications; and street drugs.

## 2014-02-03 NOTE — ED Notes (Signed)
Patient transported to CT 

## 2014-02-03 NOTE — ED Notes (Signed)
Bed: WA10 Expected date: 02/03/14 Expected time: 5:47 AM Means of arrival: Ambulance Comments: Flank pain

## 2014-02-03 NOTE — ED Provider Notes (Signed)
CSN: [id number]     Arrival date & time 02/03/14  0602 History   First MD Initiated Contact with Patient 02/03/14 [phone]     Chief Complaint  Patient presents with  . Flank Pain  . Abdominal Pain   HPI  Patient is a 58 y.o. Female who presents to the ED with right flank pain and RLQ pain since last night at 10:00 pm.  Patient states that she has constant 7/10 right flank pain that wraps around to her right lower quadrant which is aching, nagging, and constant.  Patient states that she has never had pain like this before.  She states that she has been unable to get comfortable with position changes.  Patient states that she has tried no relieving factors at this time.  Patient has found no aggravating factors at this time.  Patient states that she has no history of kidney stones or pyelonephritis.  Patient has had no changes in her ostomy output which is secondary to a history of colon cancer which was treated with resection and oxaliplatin chemotherapy.  Patient has no other surgical history.  Patient is a current everyday smoker and admits to drinking several beers daily.    Past Medical History  Diagnosis Date  . 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 Hypertension   . Diverticulosis   . Radiation proctitis     mild  . Adenomatous colon polyp   . Anemia, unspecified   . Depression   . Nonspecific colitis 10/10/11  . Cocaine abuse     as recently as 10/21/11  . Hernia 03/18/2013  . Neuromuscular disorder   . 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 Incontinence of bowel     since colostomy  . History of transfusion   . Difficulty sleeping     takes meds to sleep   Past Surgical History  Procedure Laterality Date  . Mouth surgery  10/2009  . Low anterior bowel resection  03/07/09  . Colostomy  2013    [facility]  . Portacath placement      and removal  . Laparoscopic parastomal hernia N/A 05/12/2013    Procedure: LAPAROSCOPIC PARASTOMAL HERNIA;   Surgeon: Leighton Ruff, MD;  Location: [facility] ORS;  Service: General;  Laterality: N/A;  . Hernia repair     Family History  Problem Relation Age of Onset  . Colon cancer Mother   . Stomach cancer Sister   . Colon polyps Sister   . Diabetes Father   . Cirrhosis Brother   . Pulmonary Hypertension Brother    History  Substance Use Topics  . Smoking status: Current Every Day Smoker -- 40 years    Types: Cigarettes  . Smokeless tobacco: Never Used     Comment: form given 04-15-12.divorced, lives with sister Willaim Rayas. Food Service at [employer]  . Alcohol Use: Yes     Comment: occasional  driink   OB History   Grav Para Term Preterm Abortions TAB SAB Ect Mult Living                 Review of Systems  Constitutional: Negative for fever, chills and fatigue.  Respiratory: Negative for chest tightness and shortness of breath.   Cardiovascular: Negative for chest pain and palpitations.  Gastrointestinal: Positive for nausea and abdominal pain. Negative for vomiting, diarrhea, constipation, blood in stool and anal bleeding.  Genitourinary: Positive for frequency and flank pain. Negative  for dysuria, urgency, hematuria, decreased urine volume and difficulty urinating.  Musculoskeletal: Negative for back pain.  All other systems reviewed and are negative.     Allergies  Ampicillin; Hydrocodone; Penicillins; and Grapeseed extract  Home Medications   Prior to Admission medications   Medication Sig Start Date End Date Taking? Authorizing Provider  gabapentin (NEURONTIN) 400 MG capsule Take 2 capsules (800 mg total) by mouth 3 (three) times daily. 09/14/13  Yes Rowe Clack, MD  hydrochlorothiazide (HYDRODIURIL) 25 MG tablet Take 25 mg by mouth every morning.   Yes Historical Provider, MD  naproxen sodium (ANAPROX) 220 MG tablet Take 440 mg by mouth 2 (two) times daily as needed (pain).   Yes Historical Provider, MD  oxyCODONE-acetaminophen (PERCOCET/ROXICET) 5-325 MG  per tablet Take 1-2 tablets by mouth every 4 (four) hours as needed for moderate pain or severe pain. 01/16/14  Yes Kaitlyn Szekalski, PA-C   BP 163/80  Pulse 61  Temp(Src) 99 F (37.2 C) (Oral)  Resp 18  SpO2 99% Physical Exam  Nursing note and vitals reviewed. Constitutional: She is oriented to person, place, and time. She appears well-developed and well-nourished. No distress.  HENT:  Head: Normocephalic and atraumatic.  Mouth/Throat: Oropharynx is clear and moist. No oropharyngeal exudate.  Eyes: Conjunctivae and EOM are normal. Pupils are equal, round, and reactive to light. No scleral icterus.  Neck: Normal range of motion. Neck supple. No JVD present. No thyromegaly present.  Cardiovascular: Normal rate, regular rhythm, normal heart sounds and intact distal pulses.  Exam reveals no gallop and no friction rub.   No murmur heard. Pulmonary/Chest: Effort normal and breath sounds normal. No respiratory distress. She has no wheezes. She has no rales. She exhibits no tenderness.  Abdominal: Soft. Bowel sounds are normal. She exhibits no distension and no mass. There is tenderness in the right lower quadrant. There is CVA tenderness. There is no rebound and no guarding.    Musculoskeletal: Normal range of motion.  Lymphadenopathy:    She has no cervical adenopathy.  Neurological: She is alert and oriented to person, place, and time. She has normal strength. No cranial nerve deficit or sensory deficit. Coordination normal.  Skin: Skin is warm and dry. She is not diaphoretic.  Psychiatric: She has a normal mood and affect. Her behavior is normal. Judgment and thought content normal.    ED Course  Procedures (including critical care time) Labs Review Labs Reviewed  CBC WITH DIFFERENTIAL - Abnormal; Notable for the following:    WBC 10.6 (*)    Platelets 408 (*)    Neutrophils Relative % 79 (*)    Neutro Abs 8.4 (*)    All other components within normal limits  COMPREHENSIVE  METABOLIC PANEL - Abnormal; Notable for the following:    CO2 18 (*)    Glucose, Bld 124 (*)    Albumin 3.4 (*)    Alkaline Phosphatase 191 (*)    Anion gap 16 (*)    All other components within normal limits  URINALYSIS, ROUTINE W REFLEX MICROSCOPIC - Abnormal; Notable for the following:    Hgb urine dipstick LARGE (*)    All other components within normal limits  LIPASE, BLOOD  URINE MICROSCOPIC-ADD ON    Imaging Review Ct Abdomen Pelvis Wo Contrast  02/03/2014   CLINICAL DATA:  Right lower quadrant and right flank pain since yesterday  EXAM: CT ABDOMEN AND PELVIS WITHOUT CONTRAST  TECHNIQUE: Multidetector CT imaging of the abdomen and pelvis was performed following  the standard protocol without IV contrast.  COMPARISON:  09/09/2013  FINDINGS: The lung bases are free of acute infiltrate or sizable effusion.  The liver, gallbladder spleen, adrenal glands and pancreas are all normal in their CT appearance. The left kidney shows no renal calculi or obstructive changes.  The right kidney demonstrates moderate hydronephrosis extending to the level of the ureterovesical junction. A small 1-2 mm stone is noted at the right UVJ causing the obstructive change. The bladder is partially distended.  Diffuse diverticular change of the colon is noted. The appendix is well visualized and within normal limits. A left anterior abdominal wall ostomy is noted. Multiple herniated loops of small bowel are now noted adjacent to the ostomy site although no incarceration is seen. Persistent presacral changes are noted. 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 No significant lymphadenopathy is seen. The osseous structures are within normal limits for the patient's given age.  IMPRESSION: Distal right ureteral stone with obstructive change as described.  New herniated small bowel loops adjacent to the patient's known left-sided ostomy. No incarceration is noted.  Chronic changes as described stable from the previous exam.    Electronically Signed   By: Inez Catalina M.D.   On: 02/03/2014 09:16     EKG Interpretation None      MDM   Final diagnoses:  Right flank pain  Ureteral colic   Patient is a 58 y.o. Female with history of colon cancer who presents to the ED with right flank and right lower abdominal pain.  Physical exam reveals right CVA tenderness.  UA reveals no evidence of infection but does have 11-20 RBCs.  Lipase negative.  CMP appears to be at baseline with mild elevation of Alk phos.  There is only mild leukocytosis.  CT abdomen and pelvis shows 1-2 mm stone in the right UVJ with moderate hydronephrosis.  Patient treated here with 500 ml NS bolus and fentanyl with good relief.  Patient tolerating PO here and was given percocet.  Patient has percocet at home.  Patient does have some new herniation of bowel near ostomy site.  There is no evidence of incarceration and or strangulation.  Patient had no tenderness to palpation over ostomy site.  Patient to follow-up with Dr. Maurene Capes for hernia.  Patient to follow-up with [facility] for stone follow-up.  Patient to return for infected stone symptoms.  Patient was discussed with Dr. Tawnya Crook who agrees with the above plan and workup.      Jamie Kato Forcucci, PA-C 02/03/14 1100

## 2014-02-03 NOTE — Discharge Instructions (Signed)
Ureteral Colic Ureteral colic is spasm-like pain from the kidney or the ureter. This is often caused by a kidney stone. The pain is caused by the stone trying to get through the tubes that pass your pee. HOME CARE   Drink enough fluids to keep your pee (urine) clear or pale yellow.  Strain all your pee. A strainer will be provided. Keep anything caught in the strainer and bring it to your doctor. The stone causing the pain may be very small.  Only take medicine as told by your doctor.  Follow up with your doctor as told. GET HELP RIGHT AWAY IF:   Pain is not controlled with medicine.  Pain continues or gets worse.  The pain changes and there is chest or belly (abdominal) pain.  You pass out (faint).  You cannot pee.  You keep throwing up (vomiting).  You have a temperature by mouth above 102 F (38.9 C), not controlled by medicine. MAKE SURE YOU:   Understand these instructions.  Will watch this condition.  Will get help right away if you are not doing well or get worse. Document Released: 09/17/2007 Document Revised: 06/23/2011 Document Reviewed: 09/17/2007 Exit[employer] Patient Information 2015 [employer]. This information is not intended to replace advice given to you by your health care provider. Make sure you discuss any questions you have with your health care provider.  

## 2014-02-06 DIAGNOSIS — S82142D Displaced bicondylar fracture of left tibia, subsequent encounter for closed fracture with routine healing: Secondary | ICD-10-CM | POA: Diagnosis not present

## 2014-02-27 DIAGNOSIS — S82142D Displaced bicondylar fracture of left tibia, subsequent encounter for closed fracture with routine healing: Secondary | ICD-10-CM | POA: Diagnosis not present

## 2014-02-27 DIAGNOSIS — S62614D Displaced fracture of proximal phalanx of right ring finger, subsequent encounter for fracture with routine healing: Secondary | ICD-10-CM | POA: Diagnosis not present

## 2014-02-27 DIAGNOSIS — S62615D Displaced fracture of proximal phalanx of left ring finger, subsequent encounter for fracture with routine healing: Secondary | ICD-10-CM | POA: Diagnosis not present

## 2014-02-27 DIAGNOSIS — S62316D Displaced fracture of base of fifth metacarpal bone, right hand, subsequent encounter for fracture with routine healing: Secondary | ICD-10-CM | POA: Diagnosis not present

## 2014-03-07 ENCOUNTER — Ambulatory Visit: Payer: Medicare Other | Admitting: Internal Medicine

## 2014-03-20 DIAGNOSIS — S82142D Displaced bicondylar fracture of left tibia, subsequent encounter for closed fracture with routine healing: Secondary | ICD-10-CM | POA: Diagnosis not present

## 2014-03-24 ENCOUNTER — Ambulatory Visit (INDEPENDENT_AMBULATORY_CARE_PROVIDER_SITE_OTHER): Payer: Medicare Other | Admitting: Internal Medicine

## 2014-03-24 ENCOUNTER — Encounter: Payer: Self-pay | Admitting: Internal Medicine

## 2014-03-24 VITALS — BP 120/82 | HR 92 | Ht 66.0 in | Wt 171.5 lb

## 2014-03-24 DIAGNOSIS — K469 Unspecified abdominal hernia without obstruction or gangrene: Secondary | ICD-10-CM

## 2014-03-24 DIAGNOSIS — Z85048 Personal history of other malignant neoplasm of rectum, rectosigmoid junction, and anus: Secondary | ICD-10-CM

## 2014-03-24 NOTE — Progress Notes (Signed)
ALESSANDRIA HENKEN 27-Feb-1956 [id number]  Note: This dictation was prepared with Dragon digital system. Any transcriptional errors that result from this procedure are unintentional.   History of Present Illness:  This is a 58 year old African-American female diagnosed with T2N1 M0 rectal adenocarcinoma in November 2010 who underwent a lower anterior resection followed by radiation of chemotherapy. She had a subsequent diverting colostomy. She had radiation proctitis, incontinence surgery performed at [facility] in 2013. She had a large parastomal hernia and prolapsed colostomy repaired by Dr. Marcello Moores in January 2015. The colostomy was reinforced with mesh. Her last CEA level was less than 1. Her last colonoscopy via colostomy in September 2013 showed no evidence of recurrent tumor. Her next recall colonoscopy is September 2016. She has been satisfied with the repair of the ventral hernia but most recently, she has developed a recurrence of the hernia around the colostomy. This is confirmed on CT scan of the abdomen in October 2015 which showed multiple herniated loops of the small bowel in ostomy. She also has presacral postoperative changes in the Hartmann's pouch. she has moderate hydronephrosis at the UV junction. patient remains asymptomatic except for a large protuberance around the colostomy. She denies constipation, fever or bleeding per colostomy.    Past Medical History  Diagnosis Date  . 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 Hypertension   . Diverticulosis   . Radiation proctitis     mild  . Adenomatous colon polyp   . Anemia, unspecified   . Depression   . Nonspecific colitis 10/10/11  . Cocaine abuse     as recently as 10/21/11  . Hernia 03/18/2013  . Neuromuscular disorder   . 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 Incontinence of bowel     since colostomy  . History of transfusion   . Difficulty sleeping     takes meds to sleep  . Knee fracture,  left     Past Surgical History  Procedure Laterality Date  . Mouth surgery  10/2009  . Low anterior bowel resection  03/07/09  . Colostomy  2013    [facility]  . Portacath placement      and removal  . Laparoscopic parastomal hernia N/A 05/12/2013    Procedure: LAPAROSCOPIC PARASTOMAL HERNIA;  Surgeon: Leighton Ruff, MD;  Location: [facility];  Service: General;  Laterality: N/A;  . Hernia repair    . Finger fracture surgery Left     and hand surgery    Allergies  Allergen Reactions  . Ampicillin Hives  . Hydrocodone Itching  . Penicillins Other (See Comments)    Unknown reaction  . Grapeseed Extract [Nutritional Supplements] Rash    Family history and social history have been reviewed.  Review of Systems: Positive for weight gain. Negative for bleeding per colostomy  The remainder of the 10 point ROS is negative except as outlined in the H&P  Physical Exam: General Appearance Well developed, in no distress, walks with pain Eyes  Non icteric  HEENT  Non traumatic, normocephalic  Mouth No lesion, tongue papillated, no cheilosis Neck Supple without adenopathy, thyroid not enlarged, no carotid bruits, no JVD Lungs Clear to auscultation bilaterally COR Normal S1, normal S2, regular rhythm, no murmur, quiet precordium Abdomen soft with large protuberant peristomal hernia which is easily reducible around the colostomy in the left middle quadrant. Bowel sounds are normoactive. There are multiple surgical scars which are well-healed. There is no tenderness. There is  a normal stoma without protrusion. Stool is somewhat liquidy Rectal she had a rectal closure Extremities  No pedal edema, peripheral neuropathy due to chemotherapy Skin No lesions Neurological Alert and oriented x 3 Psychological Normal mood and affect  Assessment and Plan:   Problem #8 58 year old African-American female with recurrent peristomal hernia around the colostomy. She already had a repair and mesh placement in  January 2015. She has laxed abdominal wall around the ostomy. The stoma itself appears healthy and there is no prolapse. I have referred her back to Dr. Marcello Moores.It would be difficult to repair. We have discussed using an abdominal support such as abdominal binder or a corset. There is no evidence of small bowel obstruction. She is cancer free. She will be due for a colonoscopy in September 2016.    Delfin Edis 03/24/2014

## 2014-03-24 NOTE — Patient Instructions (Signed)
Follow up with Dr A.Thomas concerning ventral hernia Dr A.Marcello Moores, Dr Alvy Bimler

## 2014-04-03 ENCOUNTER — Telehealth: Payer: Self-pay | Admitting: Internal Medicine

## 2014-04-03 NOTE — Telephone Encounter (Signed)
Fill Rx & monitor BP OV if BP stays up on medication Fill that appt with acute visit please

## 2014-04-03 NOTE — Telephone Encounter (Signed)
Notified pt with md response. Cancel appt for tomorrow...[name]

## 2014-04-03 NOTE — Telephone Encounter (Signed)
Patient states she has scheduled an appointment with Dr. Linna Darner for in the morning due to high BP.  She states Dr. Linna Darner wrote her a script for bp meds and told her to take it if her bp went up again.  She has found this script.  Patient would like to know if she should get the script filled and start the meds or if she should keep her appointment to come in tomorrow.

## 2014-04-04 ENCOUNTER — Other Ambulatory Visit: Payer: Self-pay | Admitting: Internal Medicine

## 2014-04-04 ENCOUNTER — Ambulatory Visit: Payer: Medicare Other | Admitting: Internal Medicine

## 2014-04-12 IMAGING — CT CT ABD-PELV W/ CM
2 of 5 series · 17 of 46 positions shown, 19 images · IV contrast (OMNIPAQUE)
Comparison: CT, 10/10/2011

CLINICAL DATA: Follow-up rectal carcinoma.

CT ABDOMEN AND PELVIS WITH CONTRAST
TECHNIQUE: Multidetector CT imaging of the abdomen and pelvis was
performed following the standard protocol during bolus
administration of intravenous contrast.
Contrast: 100mL OMNIPAQUE IOHEXOL 300 MG/ML  SOLN

[Series 2: rtn a/p with · axial · 0.92mm/px · z∈[-498,-98]mm · 14 of 92 slices shown, 16 images]
[im 6/92  soft-tissue]
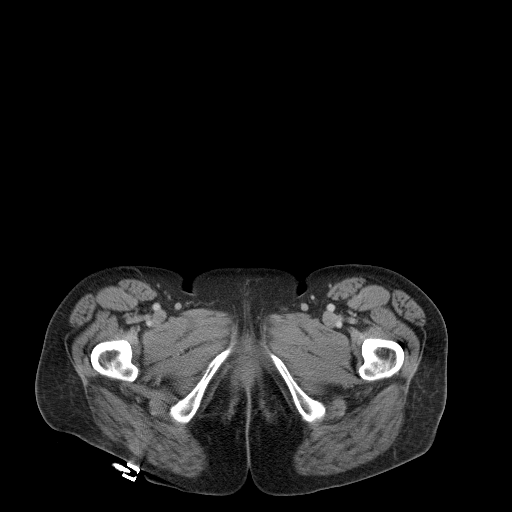
[im 6/92  bone]
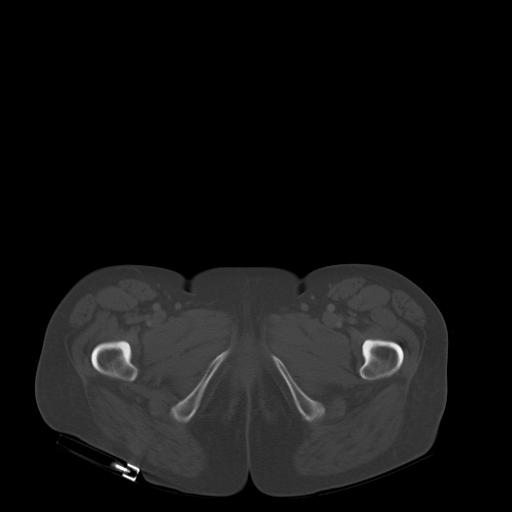
[im 11/92  soft-tissue]
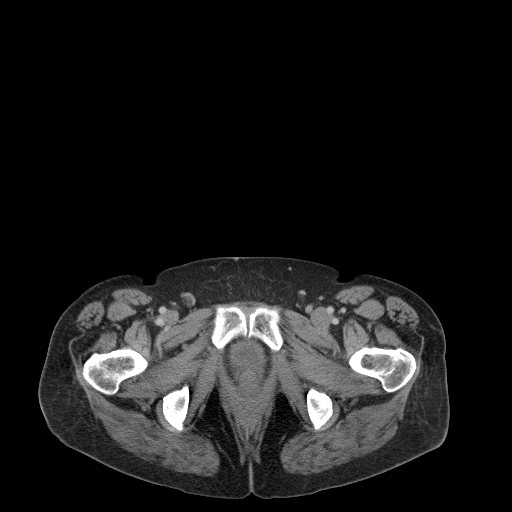
[im 21/92  soft-tissue]
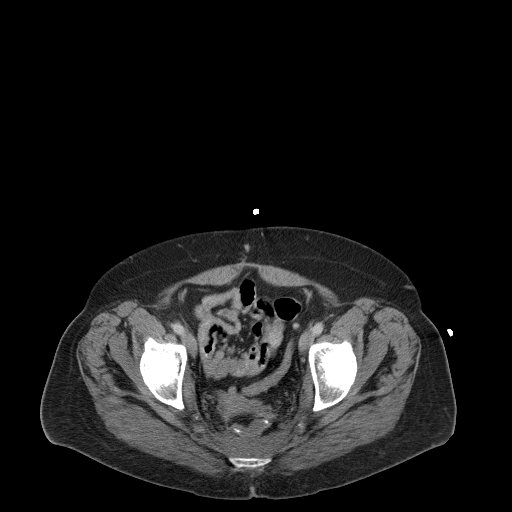
[im 26/92  soft-tissue]
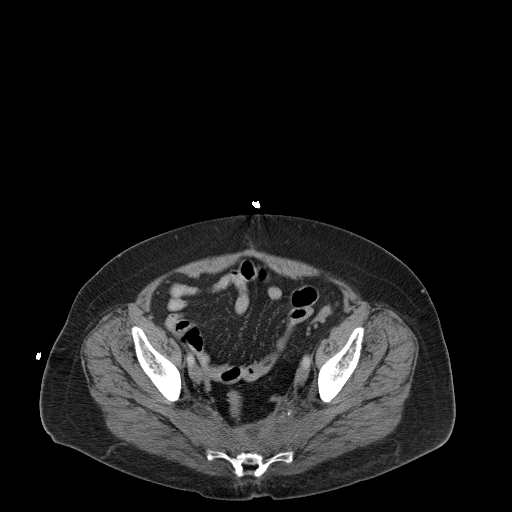
[im 31/92  soft-tissue]
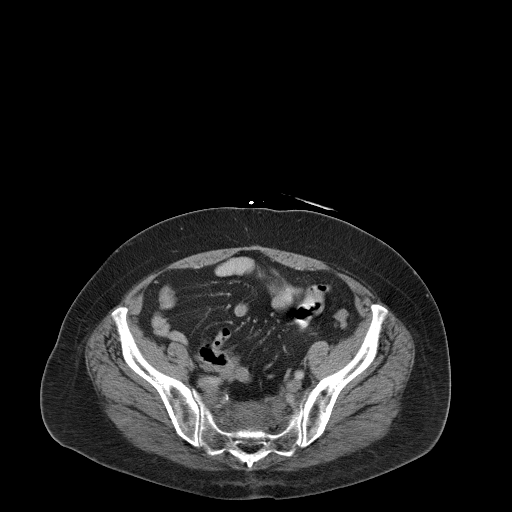
[im 36/92  soft-tissue]
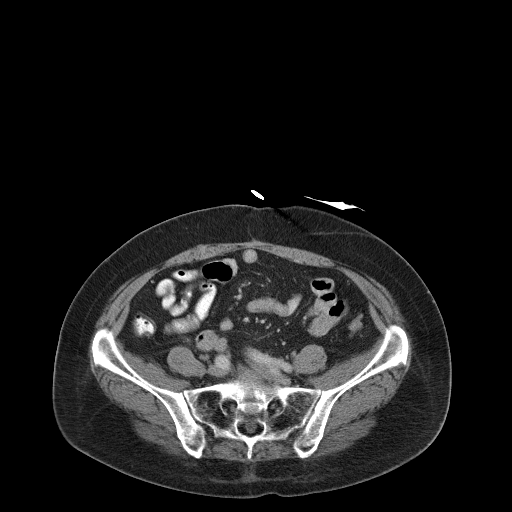
[im 41/92  soft-tissue]
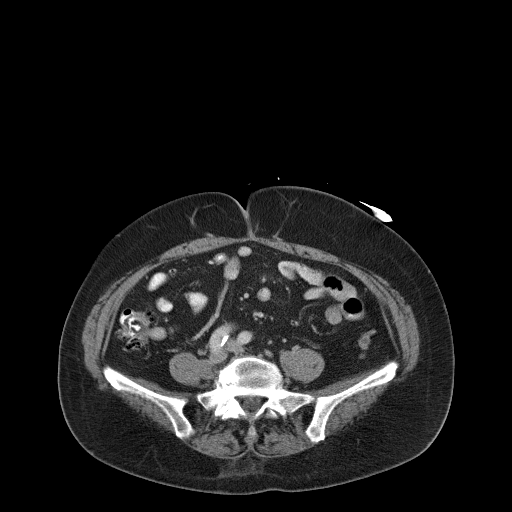
[im 51/92  soft-tissue]
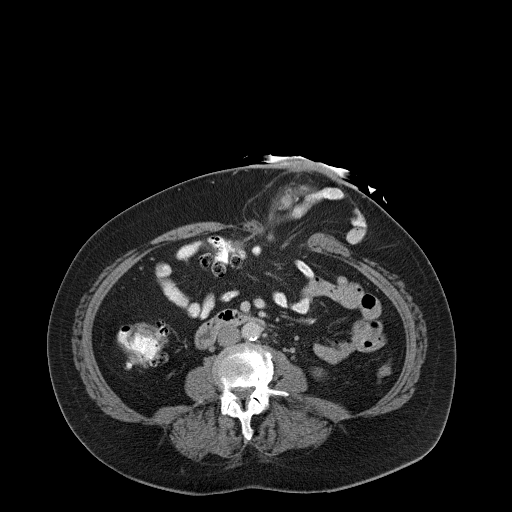
[im 56/92  soft-tissue]
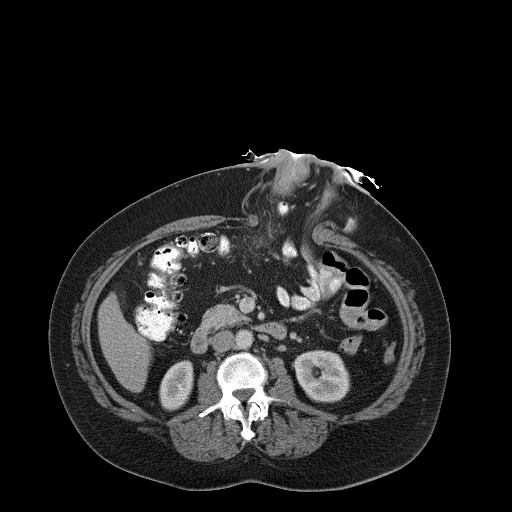
[im 56/92  bone]
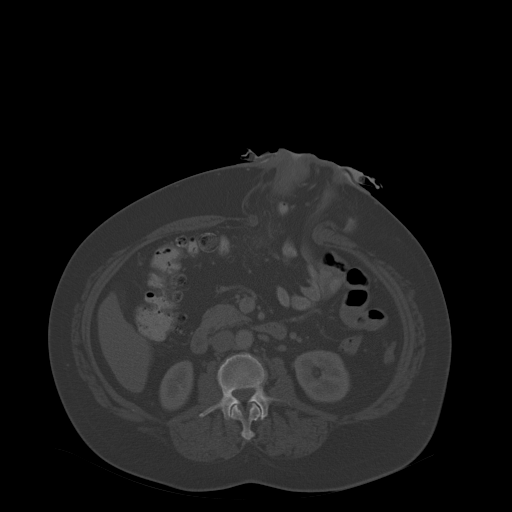
[im 61/92  soft-tissue]
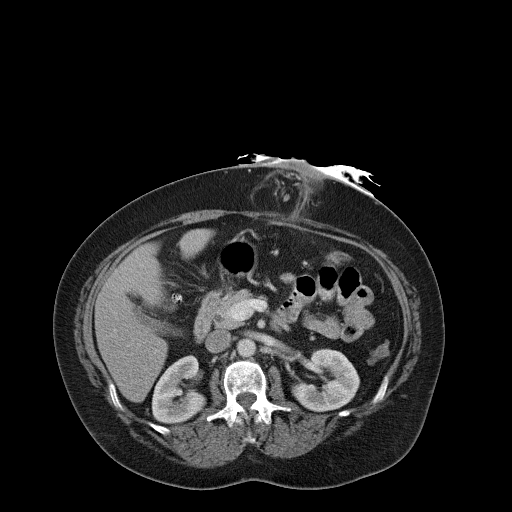
[im 66/92  soft-tissue]
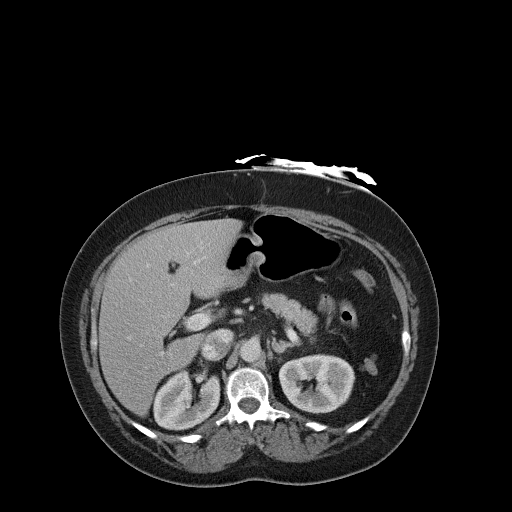
[im 71/92  soft-tissue]
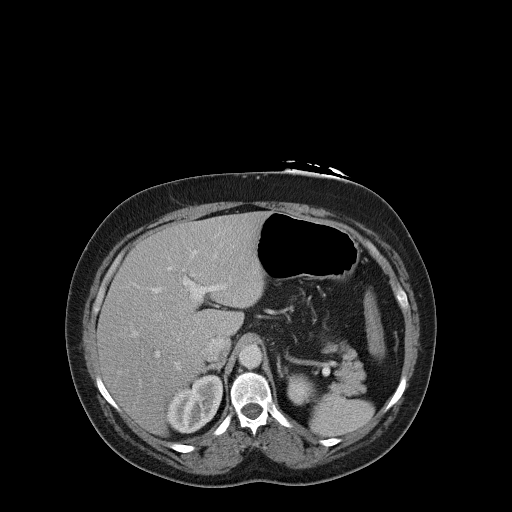
[im 81/92  soft-tissue]
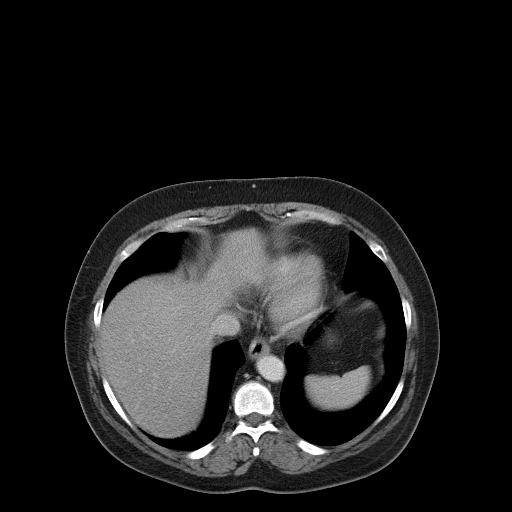
[im 86/92  soft-tissue]
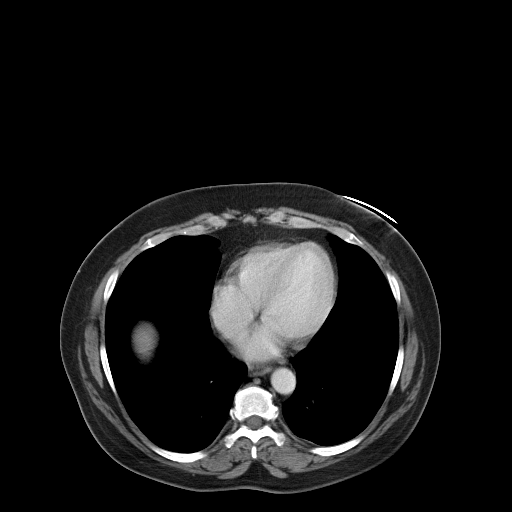

[Series 602: cor · coronal · 0.92mm/px · 3 of 90 slices shown]
[im 30/90  soft-tissue]
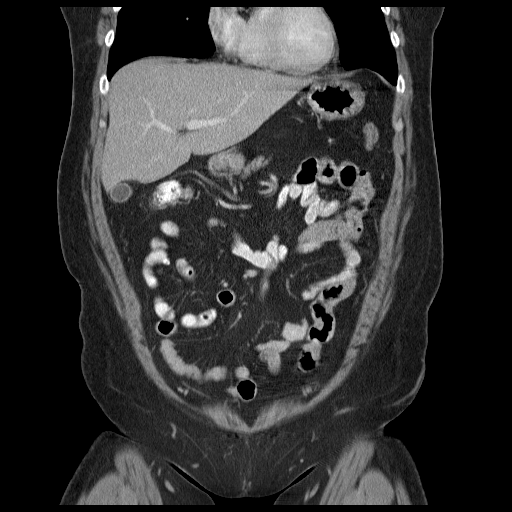
[im 40/90  soft-tissue]
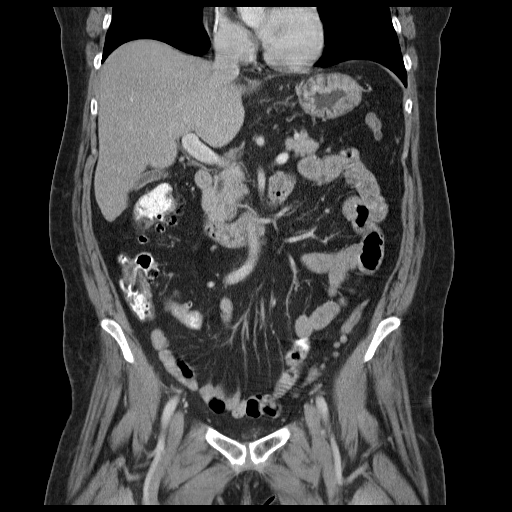
[im 50/90  soft-tissue]
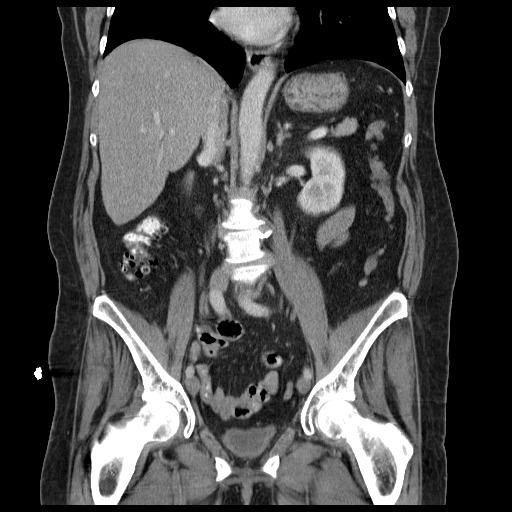

[17 of 46 positions shown; findings below may reference images not displayed]

FINDINGS: There is been a previous rectal, abdominal peroneal,
resection.  There is soft tissue attenuation in the prerectal space
with a small chronic fluid collection, measuring 26 mm by 12 mm in
size, without significant change.  This is all presumed to be
postsurgical change.  Anastomose staple line is seen in the region
of the  rectosigmoid junction.  The colon above this is
decompressed.  There are diverticula along the colon without
diverticulitis.  The transverse colon diverts into a mid abdomen
loop colostomy.  No bowel masses are seen.  There is no bowel
inflammation or obstruction.

Clear lung bases.  The heart is normal in size.

There are no liver lesions.  The hypoattenuating area noted in the
inferior medial segment of the left lobe of the liver on the prior
study is no longer evident.

Normal spleen, gallbladder and pancreas.  No bile duct dilation.
Normal adrenal glands, kidneys and bladder.

No pathologically enlarged lymph nodes.  No abnormal fluid
collections.

No osteoblastic or osteolytic lesions.
IMPRESSION: No evidence of metastatic disease from rectal carcinoma.  Soft
tissue in the presacral space consistent with stable postoperative
change with a small associated fluid collection likely a chronic
seroma.  No evidence of locally recurrent rectal carcinoma.

The area of hypoattenuation noted along the inferior liver adjacent
to the falciform ligament is no longer evident.

## 2014-04-17 DIAGNOSIS — S82142D Displaced bicondylar fracture of left tibia, subsequent encounter for closed fracture with routine healing: Secondary | ICD-10-CM | POA: Diagnosis not present

## 2014-05-23 ENCOUNTER — Ambulatory Visit (INDEPENDENT_AMBULATORY_CARE_PROVIDER_SITE_OTHER): Payer: Medicare Other | Admitting: Internal Medicine

## 2014-05-23 ENCOUNTER — Other Ambulatory Visit (INDEPENDENT_AMBULATORY_CARE_PROVIDER_SITE_OTHER): Payer: Medicare Other

## 2014-05-23 ENCOUNTER — Encounter: Payer: Self-pay | Admitting: Internal Medicine

## 2014-05-23 VITALS — BP 140/84 | HR 66 | Temp 97.7°F | Ht 66.0 in | Wt 170.8 lb

## 2014-05-23 DIAGNOSIS — Z23 Encounter for immunization: Secondary | ICD-10-CM | POA: Diagnosis not present

## 2014-05-23 DIAGNOSIS — F329 Major depressive disorder, single episode, unspecified: Secondary | ICD-10-CM

## 2014-05-23 DIAGNOSIS — F32A Depression, unspecified: Secondary | ICD-10-CM

## 2014-05-23 DIAGNOSIS — I10 Essential (primary) hypertension: Secondary | ICD-10-CM | POA: Diagnosis not present

## 2014-05-23 LAB — CBC WITH DIFFERENTIAL/PLATELET
Basophils Absolute: 0 10*3/uL (ref 0.0–0.1)
Basophils Relative: 0.2 % (ref 0.0–3.0)
Eosinophils Absolute: 0.1 10*3/uL (ref 0.0–0.7)
Eosinophils Relative: 0.7 % (ref 0.0–5.0)
HCT: 39.4 % (ref 36.0–46.0)
Hemoglobin: 13.2 g/dL (ref 12.0–15.0)
Lymphocytes Relative: 15.5 % (ref 12.0–46.0)
Lymphs Abs: 1.4 10*3/uL (ref 0.7–4.0)
MCHC: 33.6 g/dL (ref 30.0–36.0)
MCV: 88.3 fl (ref 78.0–100.0)
Monocytes Absolute: 0.6 10*3/uL (ref 0.1–1.0)
Monocytes Relative: 6.5 % (ref 3.0–12.0)
Neutro Abs: 6.9 10*3/uL (ref 1.4–7.7)
Neutrophils Relative %: 77.1 % — ABNORMAL HIGH (ref 43.0–77.0)
Platelets: 322 10*3/uL (ref 150.0–400.0)
RBC: 4.46 Mil/uL (ref 3.87–5.11)
RDW: 14.8 % (ref 11.5–15.5)
WBC: 9 10*3/uL (ref 4.0–10.5)

## 2014-05-23 LAB — HEPATIC FUNCTION PANEL
ALT: 34 U/L (ref 0–35)
AST: 36 U/L (ref 0–37)
Albumin: 4 g/dL (ref 3.5–5.2)
Alkaline Phosphatase: 112 U/L (ref 39–117)
Bilirubin, Direct: 0.1 mg/dL (ref 0.0–0.3)
Total Bilirubin: 0.5 mg/dL (ref 0.2–1.2)
Total Protein: 7 g/dL (ref 6.0–8.3)

## 2014-05-23 LAB — BASIC METABOLIC PANEL
BUN: 15 mg/dL (ref 6–23)
CO2: 21 mEq/L (ref 19–32)
Calcium: 9.2 mg/dL (ref 8.4–10.5)
Chloride: 110 mEq/L (ref 96–112)
Creatinine, Ser: 0.86 mg/dL (ref 0.40–1.20)
GFR: 87.17 mL/min (ref >60.00)
Glucose, Bld: 123 mg/dL — ABNORMAL HIGH (ref 70–99)
Potassium: 3.9 mEq/L (ref 3.5–5.1)
Sodium: 141 mEq/L (ref 135–145)

## 2014-05-23 LAB — LIPID PANEL
Cholesterol: 136 mg/dL (ref 0–200)
HDL: 44.2 mg/dL (ref >39.00)
NonHDL: 91.8
Total CHOL/HDL Ratio: 3
Triglycerides: 320 mg/dL — ABNORMAL HIGH (ref 0.0–149.0)
VLDL: 64 mg/dL — ABNORMAL HIGH (ref 0.0–40.0)

## 2014-05-23 LAB — TSH: TSH: 0.87 u[IU]/mL (ref 0.35–4.50)

## 2014-05-23 LAB — LDL CHOLESTEROL, DIRECT: Direct LDL: 44 mg/dL

## 2014-05-23 MED ORDER — ALPRAZOLAM 0.5 MG PO TABS: 0.5000 mg | ORAL_TABLET | Freq: Two times a day (BID) | ORAL | Status: DC | PRN

## 2014-05-23 MED ORDER — BUPROPION HCL ER (XL) 150 MG PO TB24: 150.0000 mg | ORAL_TABLET | Freq: Every day | ORAL | Status: DC

## 2014-05-23 MED ORDER — AMITRIPTYLINE HCL 25 MG PO TABS: 25.0000 mg | ORAL_TABLET | Freq: Every evening | ORAL | Status: DC | PRN

## 2014-05-23 MED ORDER — GABAPENTIN 400 MG PO CAPS: 800.0000 mg | ORAL_CAPSULE | Freq: Three times a day (TID) | ORAL | Status: DC

## 2014-05-23 MED ORDER — LISINOPRIL 20 MG PO TABS: 20.0000 mg | ORAL_TABLET | Freq: Every day | ORAL | Status: DC

## 2014-05-23 MED ORDER — CITALOPRAM HYDROBROMIDE 20 MG PO TABS: 20.0000 mg | ORAL_TABLET | Freq: Every morning | ORAL | Status: DC

## 2014-05-23 NOTE — Progress Notes (Signed)
Pre visit review using our clinic review tool, if applicable. No additional management support is needed unless otherwise documented below in the visit note. 
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
## 2014-05-23 NOTE — Assessment & Plan Note (Signed)
The current medical regimen is effective;  continue present plan and medications. BP Readings from Last 3 Encounters:  05/23/14 140/84  03/24/14 120/82  02/03/14 136/69

## 2014-05-23 NOTE — Patient Instructions (Signed)
It was good to see you today.  We have reviewed your prior records including labs and tests today  Prevnar (pneumonia) vaccine and flu shot updated today  Test(s) ordered today. Your results will be released to [place] (or called to you) after review, usually within 72hours after test completion. If any changes need to be made, you will be notified at that same time.  Medications reviewed and updated Resume antidepressants as previously prescribed - no other changes recommended at this time.  Your prescription(s) have been submitted to your pharmacy. Please take as directed and contact our office if you believe you are having problem(s) with the medication(s).  Please schedule followup in 3-4 months, call sooner if problems.

## 2014-05-24 DIAGNOSIS — S82142D Displaced bicondylar fracture of left tibia, subsequent encounter for closed fracture with routine healing: Secondary | ICD-10-CM | POA: Diagnosis not present

## 2014-05-25 ENCOUNTER — Other Ambulatory Visit: Payer: Self-pay | Admitting: Internal Medicine

## 2014-05-25 DIAGNOSIS — R739 Hyperglycemia, unspecified: Secondary | ICD-10-CM

## 2014-08-08 NOTE — Telephone Encounter (Signed)
Encounter closed

## 2014-09-05 ENCOUNTER — Other Ambulatory Visit (INDEPENDENT_AMBULATORY_CARE_PROVIDER_SITE_OTHER): Payer: Medicare Other

## 2014-09-05 ENCOUNTER — Encounter: Payer: Self-pay | Admitting: Internal Medicine

## 2014-09-05 ENCOUNTER — Ambulatory Visit (INDEPENDENT_AMBULATORY_CARE_PROVIDER_SITE_OTHER): Payer: Medicare Other | Admitting: Internal Medicine

## 2014-09-05 VITALS — BP 120/70 | HR 60 | Temp 97.6°F | Ht 66.0 in | Wt 180.0 lb

## 2014-09-05 DIAGNOSIS — K469 Unspecified abdominal hernia without obstruction or gangrene: Secondary | ICD-10-CM

## 2014-09-05 DIAGNOSIS — F32A Depression, unspecified: Secondary | ICD-10-CM

## 2014-09-05 DIAGNOSIS — Z85048 Personal history of other malignant neoplasm of rectum, rectosigmoid junction, and anus: Secondary | ICD-10-CM

## 2014-09-05 DIAGNOSIS — R739 Hyperglycemia, unspecified: Secondary | ICD-10-CM | POA: Diagnosis not present

## 2014-09-05 DIAGNOSIS — F329 Major depressive disorder, single episode, unspecified: Secondary | ICD-10-CM | POA: Diagnosis not present

## 2014-09-05 DIAGNOSIS — IMO0002 Reserved for concepts with insufficient information to code with codable children: Secondary | ICD-10-CM

## 2014-09-05 DIAGNOSIS — G622 Polyneuropathy due to other toxic agents: Secondary | ICD-10-CM

## 2014-09-05 DIAGNOSIS — I10 Essential (primary) hypertension: Secondary | ICD-10-CM

## 2014-09-05 LAB — HEMOGLOBIN A1C: Hgb A1c MFr Bld: 6.2 % (ref 4.6–6.5)

## 2014-09-05 MED ORDER — CITALOPRAM HYDROBROMIDE 20 MG PO TABS: 20.0000 mg | ORAL_TABLET | Freq: Every morning | ORAL | Status: DC

## 2014-09-05 MED ORDER — GABAPENTIN 400 MG PO CAPS: 1200.0000 mg | ORAL_CAPSULE | Freq: Three times a day (TID) | ORAL | Status: DC

## 2014-09-05 MED ORDER — ALPRAZOLAM 0.5 MG PO TABS: 0.5000 mg | ORAL_TABLET | Freq: Two times a day (BID) | ORAL | Status: DC | PRN

## 2014-09-05 MED ORDER — AMITRIPTYLINE HCL 25 MG PO TABS: 25.0000 mg | ORAL_TABLET | Freq: Every evening | ORAL | Status: DC | PRN
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
## 2014-09-05 NOTE — Patient Instructions (Signed)
It was good to see you today.  We have reviewed your prior records including labs and tests today  Test(s) ordered today. Your results will be released to [place] (or called to you) after review, usually within 72hours after test completion. If any changes need to be made, you will be notified at that same time.  Medications reviewed and updated Resume Celexa, amitriptyline and Xanax as previously prescribed Increase gabapentin to maximum dose 3600 mg daily no other changes recommended at this time.  Your prescription(s) have been printed and signed, and given to you to submit to your pharmacy. Please take as directed and contact our office if you believe you are having problem(s) with the medication(s).  we'll make referral to Dr. Marcello Moores for evaluation of your peristomal hernia concerns . Our office will contact you regarding appointment(s) once made.  Please schedule followup in 6 months, call sooner if problems.

## 2014-09-05 NOTE — Assessment & Plan Note (Signed)
Followed with onc x 5 years, last OV 09/2013 For repeat colonoscopy 11/2014 with GI (27yrs out) Pt aware of scheduled follow up plans

## 2014-09-05 NOTE — Progress Notes (Signed)
Pre visit review using our clinic review tool, if applicable. No additional management support is needed unless otherwise documented below in the visit note. 

## 2014-09-05 NOTE — Assessment & Plan Note (Signed)
Repaired 04/2013 but slowly increasing recurrence of pressure around stoma Will refer back to gen surg to eval and advise as needed Marcello Moores)

## 2014-09-05 NOTE — Assessment & Plan Note (Signed)
The current medical regimen is effective;  continue present plan and medications. BP Readings from Last 3 Encounters:  09/05/14 120/70  05/23/14 140/84  03/24/14 120/82
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
## 2014-09-05 NOTE — Assessment & Plan Note (Signed)
Feet>hands - related to prior chemo  Started gabapentin 2012 for same - titrate up 03/2012, titrate to max dose again now Previously on Oxy prn neuro pain < rectal pain, but onc stopped rx'ing due to +UDS (coc) 10/2011 -

## 2014-09-14 ENCOUNTER — Telehealth: Payer: Self-pay | Admitting: Internal Medicine

## 2014-09-14 NOTE — Telephone Encounter (Signed)
Please give call back in regards to lab results.

## 2014-09-15 NOTE — Telephone Encounter (Signed)
Pt informed of lab results. 

## 2014-10-04 ENCOUNTER — Other Ambulatory Visit: Payer: Self-pay

## 2014-10-04 DIAGNOSIS — Z433 Encounter for attention to colostomy: Secondary | ICD-10-CM | POA: Diagnosis not present

## 2014-10-04 DIAGNOSIS — K435 Parastomal hernia without obstruction or  gangrene: Secondary | ICD-10-CM

## 2014-10-13 ENCOUNTER — Ambulatory Visit
Admission: RE | Admit: 2014-10-13 | Discharge: 2014-10-13 | Disposition: A | Discharge status: Home / Self Care | Payer: Medicare Other | Source: Ambulatory Visit | Attending: General Surgery | Admitting: General Surgery

## 2014-10-13 DIAGNOSIS — K435 Parastomal hernia without obstruction or  gangrene: Secondary | ICD-10-CM | POA: Diagnosis not present

## 2014-10-13 DIAGNOSIS — Z933 Colostomy status: Secondary | ICD-10-CM | POA: Diagnosis not present

## 2014-10-13 DIAGNOSIS — K5790 Diverticulosis of intestine, part unspecified, without perforation or abscess without bleeding: Secondary | ICD-10-CM | POA: Diagnosis not present

## 2014-10-13 MED ORDER — IOPAMIDOL (ISOVUE-300) INJECTION 61%
100.0000 mL | Freq: Once | INTRAVENOUS | Status: AC | PRN
  Administered 2014-10-13: 100 mL via INTRAVENOUS

## 2014-10-20 ENCOUNTER — Encounter: Payer: Self-pay | Admitting: Internal Medicine

## 2014-10-25 ENCOUNTER — Encounter: Payer: Self-pay | Admitting: Internal Medicine

## 2014-10-28 IMAGING — XA IR REMOVAL TUNNELED CV CATH W/PORT/PUMP
1 series · 1 of 1 positions shown · non-contrast
Comparison: none

CLINICAL DATA: History of rectal carcinoma with prior Port-A-Cath
placement in 4464. The patient now requires removal of the port.

[Series 1: care single · 1 of 1 slices shown]
[im 1/1]
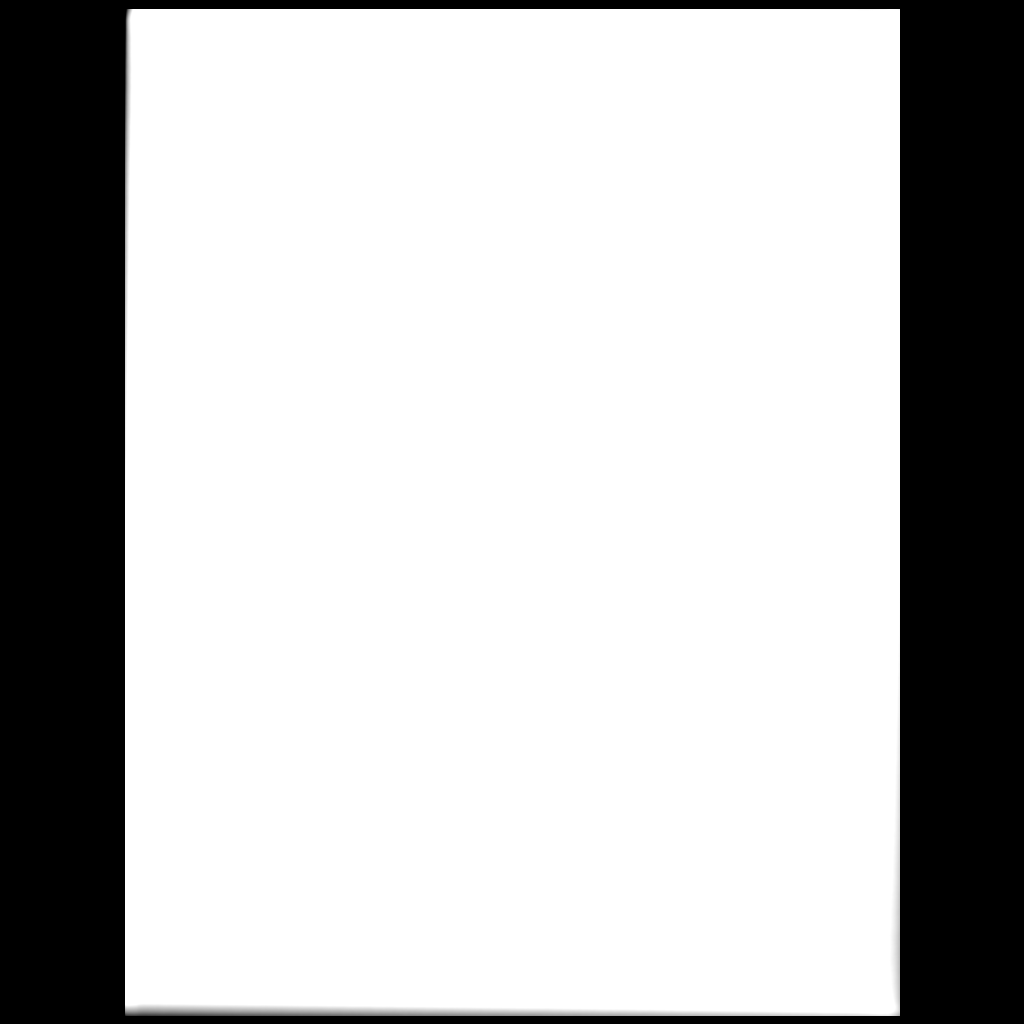

[1 of 1 positions shown; findings below may reference images not displayed]

EXAM:
REMOVAL OF IMPLANTED TUNNELED PORT-A-CATH

MEDICATIONS:
1 gram IV vancomycin. Vancomycin was given within two hours of
incision. Vancomycin was given due to an antibiotic allergy.

PROCEDURE:
The right chest Port-A-Cath site was prepped with chlorhexidine. A
sterile gown and gloves were worn during the procedure. Local
anesthesia was provided with 1% lidocaine.

An incision was made overlying the Port-A-Cath with a #15 scalpel.
Utilizing sharp and blunt dissection, the Port-A-Cath was removed.
Portable cautery was utilized. Retention sutures were removed. The
pocked was irrigated with sterile saline. Wound closure was
performed with subcutaneous 3-0 Monocryl, subcuticular 4-0 Vicryl
and Dermabond.

The entire Port-A-Cath was removed successfully.
IMPRESSION: Removal of implanted Port-A-Cath utilizing sharp and blunt
dissection. The procedure was uncomplicated.

## 2014-11-08 DIAGNOSIS — K435 Parastomal hernia without obstruction or  gangrene: Secondary | ICD-10-CM | POA: Diagnosis not present

## 2014-11-13 ENCOUNTER — Other Ambulatory Visit: Payer: Self-pay | Admitting: Internal Medicine

## 2014-11-15 ENCOUNTER — Telehealth: Payer: Self-pay | Admitting: Internal Medicine

## 2014-11-15 NOTE — Telephone Encounter (Signed)
Patient states she has been having trouble with her left knee.  She has been to [facility].  She does not want to see Dr. Tamala Julian.  She has an appointment next week for flexogenicx.  She is requesting Dr. Maryclare Labrador opinion on this.

## 2014-11-16 ENCOUNTER — Encounter: Payer: Self-pay | Admitting: Internal Medicine

## 2014-11-16 NOTE — Telephone Encounter (Signed)
Can you call pt, PCP will not be back til the 15th.

## 2014-11-16 NOTE — Telephone Encounter (Signed)
Left patient vm °

## 2014-11-28 NOTE — Telephone Encounter (Signed)
LVM for pt to call back as soon as possible.   

## 2014-11-28 NOTE — Telephone Encounter (Signed)
Please let patient know I'm not familiar with flexogenics -therefore I am unable to advise. If treatment of any type helps symptoms and reduce his need for pain medications, I am in favor of such therapy.

## 2014-12-01 IMAGING — CR DG CHEST 2V
2 series · 2 of 2 positions shown · non-contrast
Comparison: PA and lateral chest x-ray dated March 18, 2010

CLINICAL DATA: Preoperative prior to parastomal hernia repair of a
history of rectosigmoid malignancy, tobacco use, and hypertension.

EXAM:
CHEST  2 VIEW

[w chest pa]
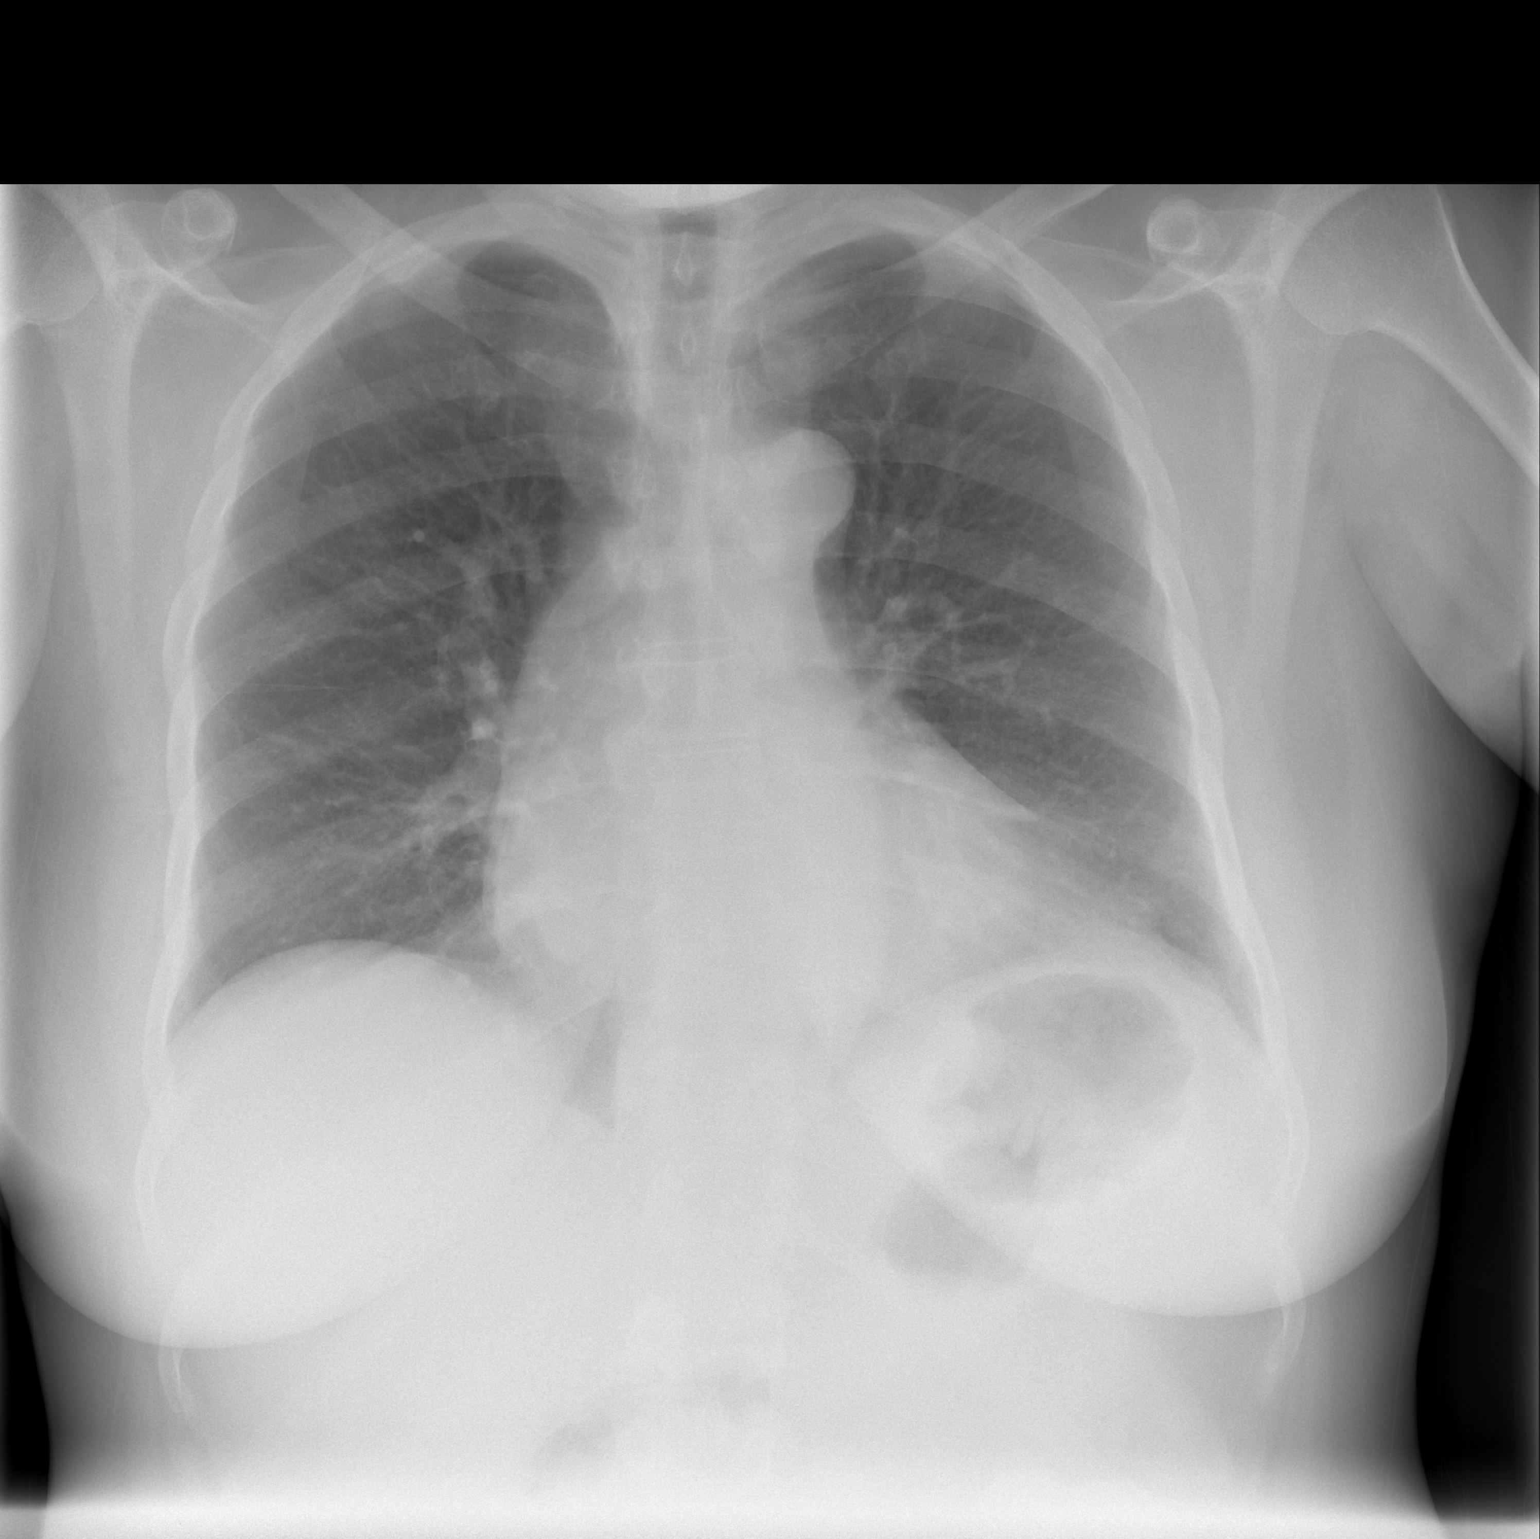

[w chest lat]
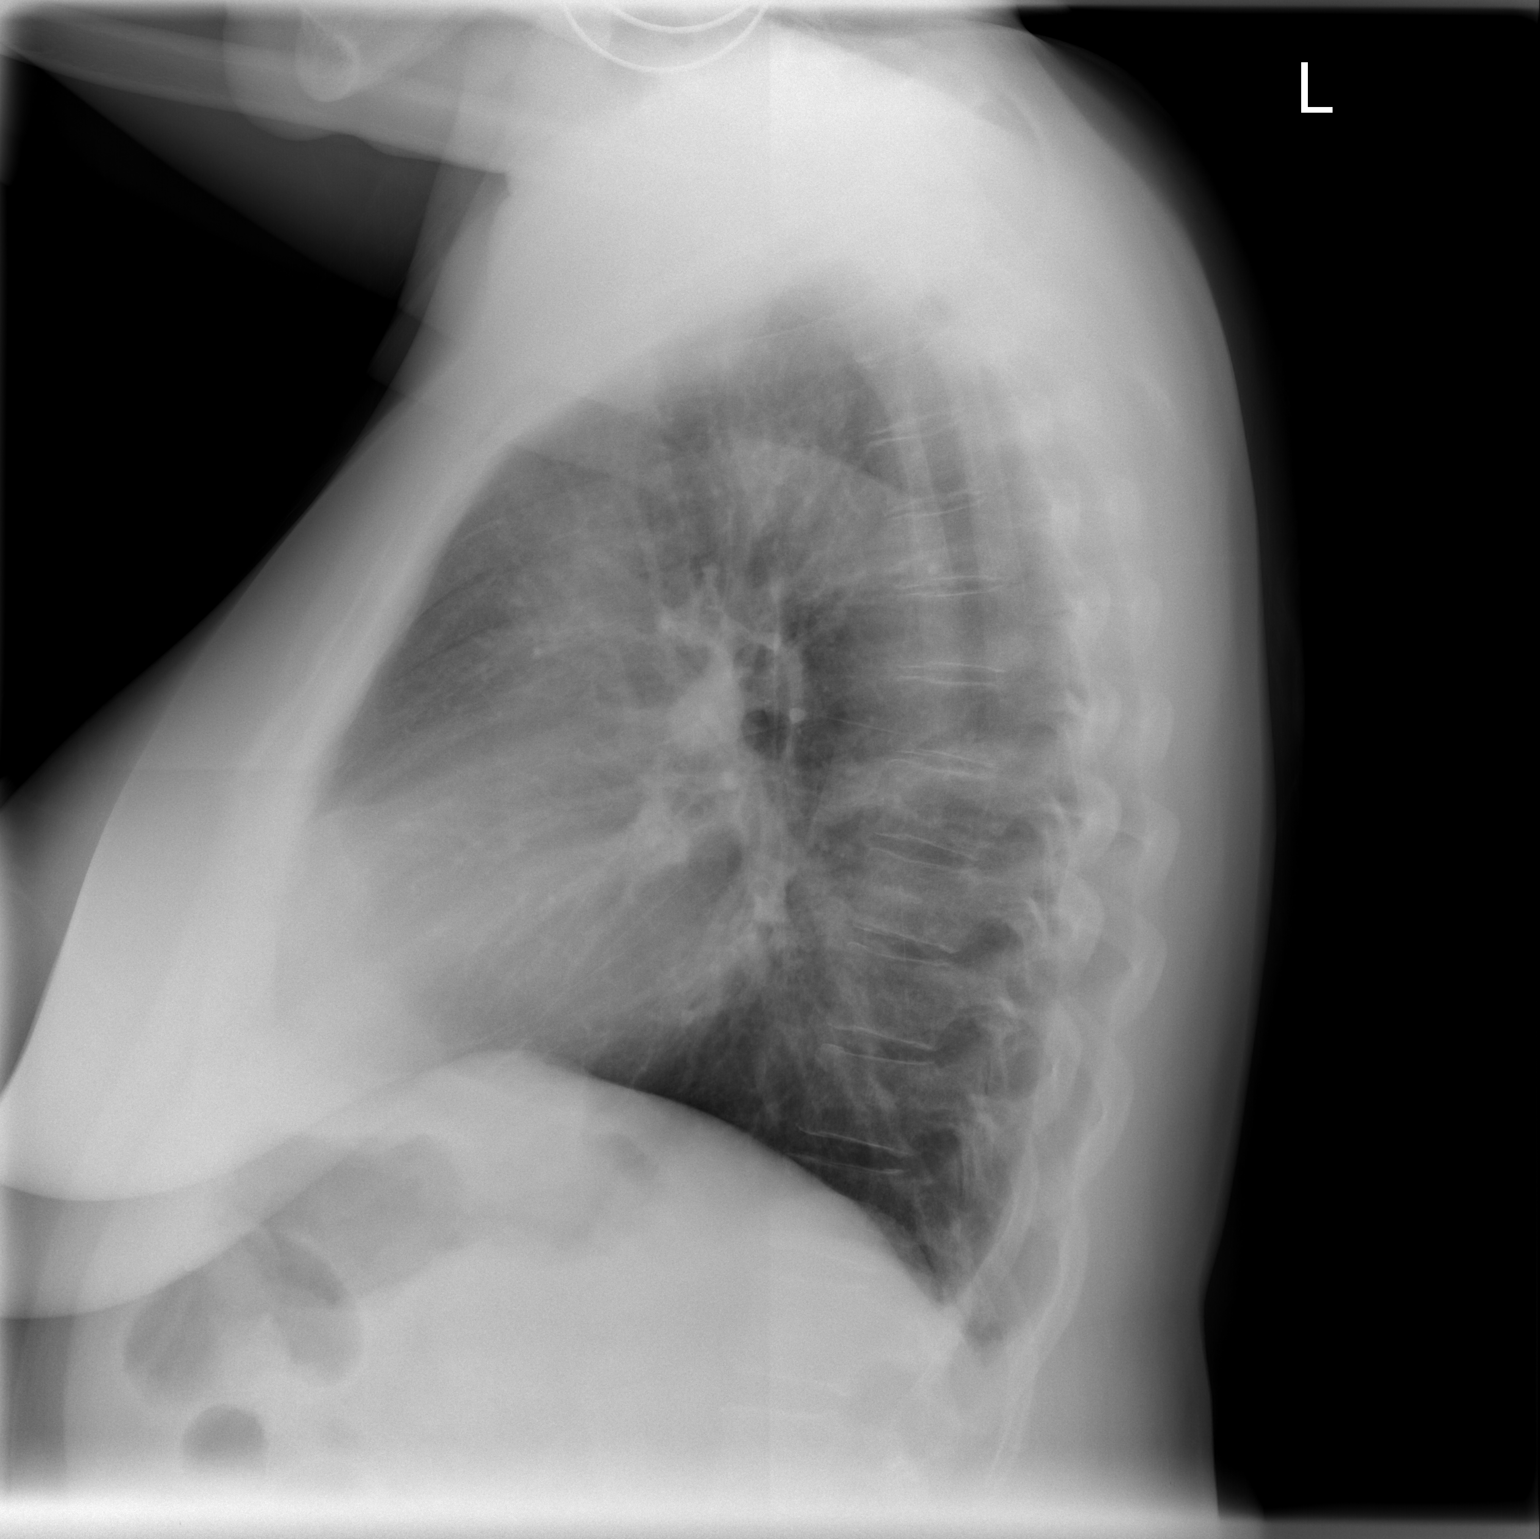

[2 of 2 positions shown; findings below may reference images not displayed]

FINDINGS: The lungs are adequately inflated. There is no focal infiltrate. No
pulmonary parenchymal masses are demonstrated. The Port-A-Cath
appliance previously present has been removed. The cardiopericardial
silhouette is mildly enlarged. The pulmonary vascularity is not
engorged. There is mild tortuosity of the descending thoracic aorta.
There is no pleural effusion or pneumothorax. The observed portions
of the bony thorax exhibit no acute abnormalities.
IMPRESSION: There is no focal pneumonia nor evidence of pulmonary interstitial
edema. There is mild enlargement of the cardiac silhouette which is
new since the previous study. The central pulmonary vascularity is
mildly prominent but not greatly changed from the previous study.

## 2014-12-07 ENCOUNTER — Emergency Department (HOSPITAL_COMMUNITY): Payer: Medicare Other

## 2014-12-07 ENCOUNTER — Emergency Department (HOSPITAL_COMMUNITY)
Admission: EM | Admit: 2014-12-07 | Discharge: 2014-12-07 | Disposition: A | Discharge status: Home / Self Care | Payer: Medicare Other | Attending: Emergency Medicine | Admitting: Emergency Medicine

## 2014-12-07 ENCOUNTER — Encounter (HOSPITAL_COMMUNITY): Payer: Self-pay | Admitting: Emergency Medicine

## 2014-12-07 DIAGNOSIS — S50312A Abrasion of left elbow, initial encounter: Secondary | ICD-10-CM | POA: Insufficient documentation

## 2014-12-07 DIAGNOSIS — Z720 Tobacco use: Secondary | ICD-10-CM | POA: Insufficient documentation

## 2014-12-07 DIAGNOSIS — Z880 Allergy status to penicillin: Secondary | ICD-10-CM | POA: Insufficient documentation

## 2014-12-07 DIAGNOSIS — Y9389 Activity, other specified: Secondary | ICD-10-CM | POA: Diagnosis not present

## 2014-12-07 DIAGNOSIS — Z86018 Personal history of other benign neoplasm: Secondary | ICD-10-CM | POA: Diagnosis not present

## 2014-12-07 DIAGNOSIS — Y929 Unspecified place or not applicable: Secondary | ICD-10-CM | POA: Insufficient documentation

## 2014-12-07 DIAGNOSIS — Z85048 Personal history of other malignant neoplasm of rectum, rectosigmoid junction, and anus: Secondary | ICD-10-CM | POA: Diagnosis not present

## 2014-12-07 DIAGNOSIS — S52515A Nondisplaced fracture of left radial styloid process, initial encounter for closed fracture: Secondary | ICD-10-CM | POA: Diagnosis not present

## 2014-12-07 DIAGNOSIS — I10 Essential (primary) hypertension: Secondary | ICD-10-CM | POA: Diagnosis not present

## 2014-12-07 DIAGNOSIS — W1839XA Other fall on same level, initial encounter: Secondary | ICD-10-CM | POA: Insufficient documentation

## 2014-12-07 DIAGNOSIS — Z8719 Personal history of other diseases of the digestive system: Secondary | ICD-10-CM | POA: Insufficient documentation

## 2014-12-07 DIAGNOSIS — S52615A Nondisplaced fracture of left ulna styloid process, initial encounter for closed fracture: Secondary | ICD-10-CM | POA: Diagnosis not present

## 2014-12-07 DIAGNOSIS — F329 Major depressive disorder, single episode, unspecified: Secondary | ICD-10-CM | POA: Insufficient documentation

## 2014-12-07 DIAGNOSIS — Z79899 Other long term (current) drug therapy: Secondary | ICD-10-CM | POA: Insufficient documentation

## 2014-12-07 DIAGNOSIS — G629 Polyneuropathy, unspecified: Secondary | ICD-10-CM | POA: Insufficient documentation

## 2014-12-07 DIAGNOSIS — S52512A Displaced fracture of left radial styloid process, initial encounter for closed fracture: Secondary | ICD-10-CM

## 2014-12-07 DIAGNOSIS — Y998 Other external cause status: Secondary | ICD-10-CM | POA: Insufficient documentation

## 2014-12-07 DIAGNOSIS — S6992XA Unspecified injury of left wrist, hand and finger(s), initial encounter: Secondary | ICD-10-CM | POA: Diagnosis present

## 2014-12-07 MED ORDER — OXYCODONE-ACETAMINOPHEN 5-325 MG PO TABS: 1.0000 | ORAL_TABLET | Freq: Four times a day (QID) | ORAL | Status: DC | PRN

## 2014-12-07 NOTE — ED Notes (Signed)
Verbalized understanding discharge instructions and follow-up. In no acute distress.   

## 2014-12-07 NOTE — ED Provider Notes (Signed)
CSN: [id number]   Arrival date & time 12/07/14 1245  History  This chart was scribed for non-physician practitioner, Domenic Moras PA-C , working with Jola Schmidt, MD by Altamease Oiler, ED Scribe. This patient was seen in room WTR8/WTR8 and the patient's care was started at 1:49 PM.  Chief Complaint  Patient presents with  . Wrist Pain    HPI The history is provided by the patient. No language interpreter was used.   [name] is a 59 y.o. female who presents to the Emergency Department complaining of constant left wrist pain with gradual onset 2 nights ago after using the left arm to stop a fall. She describes the pain as like a "nagging toothache" that is rated 6/10 in severity. Ice and Tylenol provided insufficient pain relief PTA.  Associated symptoms include left wrist swelling and abrasion at the left elbow.  No shoulder pain or elbow pain. Pt is right hand dominant. In the past she has broken 3 bones in the left hand and followed up with Dr. Burney Gauze.    Past Medical History  Diagnosis Date  . 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 Hypertension   . Diverticulosis   . Radiation proctitis     mild  . Adenomatous colon polyp   . Anemia, unspecified   . Depression   . Nonspecific colitis 10/10/11  . Cocaine abuse     as recently as 10/21/11  . Hernia 03/18/2013  . 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 Incontinence of bowel     since colostomy  . History of transfusion   . Difficulty sleeping     takes meds to sleep  . Knee fracture, left     Past Surgical History  Procedure Laterality Date  . Mouth surgery  10/2009  . Low anterior bowel resection  03/07/09  . Colostomy  2013    [facility]  . Portacath placement      and removal  . Laparoscopic parastomal hernia N/A 05/12/2013    Procedure: LAPAROSCOPIC PARASTOMAL HERNIA;  Surgeon: Leighton Ruff, MD;  Location: [facility] ORS;  Service: General;  Laterality: N/A;  . Hernia repair    . Finger  fracture surgery Left     and hand surgery    Family History  Problem Relation Age of Onset  . Colon cancer Mother   . Stomach cancer Sister   . Colon polyps Sister   . Diabetes Father   . Cirrhosis Brother   . Pulmonary Hypertension Brother     Social History  Substance Use Topics  . Smoking status: Current Every Day Smoker -- 40 years    Types: Cigarettes  . Smokeless tobacco: Never Used  . Alcohol Use: 0.0 oz/week    0 Standard drinks or equivalent per week     Comment: occasional  driink     Review of Systems  Musculoskeletal:       Pain and swelling at the left wrist Abrasion at the left elbow  Neurological: Negative for syncope.    Home Medications   Prior to Admission medications   Medication Sig Start Date End Date Taking? Authorizing Provider  ALPRAZolam Duanne Moron) 0.5 MG tablet Take 1 tablet (0.5 mg total) by mouth 2 (two) times daily as needed for anxiety or sleep. 11/13/14   Rowe Clack, MD  amitriptyline (ELAVIL) 25 MG tablet Take 1 tablet (25 mg total) by mouth at bedtime as needed for sleep.  09/05/14   Rowe Clack, MD  citalopram (CELEXA) 20 MG tablet Take 1 tablet (20 mg total) by mouth every morning. 09/05/14   Rowe Clack, MD  gabapentin (NEURONTIN) 400 MG capsule Take 3 capsules (1,200 mg total) by mouth 3 (three) times daily. 09/05/14   Rowe Clack, MD  hydrochlorothiazide (HYDRODIURIL) 25 MG tablet Take 25 mg by mouth every morning.    Historical Provider, MD  lisinopril (PRINIVIL,ZESTRIL) 20 MG tablet Take 1 tablet (20 mg total) by mouth daily. When BP averages > 140/90 05/23/14   Rowe Clack, MD  naproxen sodium (ANAPROX) 220 MG tablet Take 440 mg by mouth 2 (two) times daily as needed (pain).    Historical Provider, MD    Allergies  Ampicillin; Hydrocodone; Penicillins; and Grapeseed extract  Triage Vitals: BP 141/75 mmHg  Pulse 73  Temp(Src) 98.3 F (36.8 C) (Oral)  Resp 18  SpO2 100%  Physical Exam   Constitutional: She is oriented to person, place, and time. She appears well-developed and well-nourished.  HENT:  Head: Normocephalic.  Eyes: EOM are normal.  Neck: Normal range of motion.  Pulmonary/Chest: Effort normal.  Abdominal: She exhibits no distension.  Musculoskeletal: She exhibits edema and tenderness.  Lefts shoulder nontender to palpation Left elbow in non tender to palpation  Left wrist tenderness most significant at radial aspect with surrounding edema.  Decreased wrist flexion, extension, and supination secondary to pain No crepitus No pain at anatomical snuff box Brisk capillary refill to all fingers  Neurological: She is alert and oriented to person, place, and time.  Psychiatric: She has a normal mood and affect.  Nursing note and vitals reviewed.   ED Course  Procedures  DIAGNOSTIC STUDIES: Oxygen Saturation is 100% on RA,  normal by my interpretation.    COORDINATION OF CARE: 1:53 PM Discussed treatment plan which includes vollar splint, consultation with hand surgeon with pt at bedside and pt agreed to plan.  2:28 PM Appreciate consultation from hand specialist Dr. Bertis Ruddy PA who recommend vollar splint and f/u outpt next week for further care.  Pt d/c with a diagnosis of closed L radial styloid fx.    Labs Review- Labs Reviewed - No data to display  Imaging Review Dg Wrist Complete Left  12/07/2014   CLINICAL DATA:  Left wrist pain and swelling following a fall on 12/05/2014.  EXAM: LEFT WRIST - COMPLETE 3+ VIEW  COMPARISON:  12/23/2013.  FINDINGS: Oblique fracture through the radial styloid without significant displacement or angulation. No additional fractures or dislocation.  IMPRESSION: Nondisplaced radial styloid fracture, involving the radiocarpal joint.   Electronically Signed   By: Claudie Revering M.D.   On: 12/07/2014 13:29    I, Domenic Moras PA-C, personally reviewed and evaluated these images as part of my medical decision-making.   EKG  Interpretation None     MDM   Final diagnoses:  Radial styloid fracture, left, closed, initial encounter    BP 141/75 mmHg  Pulse 73  Temp(Src) 98.3 F (36.8 C) (Oral)  Resp 18  SpO2 100%  I have reviewed nursing notes and vital signs. I personally viewed the imaging tests through PACS system and agrees with radiologist's intepretation I reviewed available ER/hospitalization records through the EMR  I personally performed the services described in this documentation, which was scribed in my presence. The recorded information has been reviewed and is accurate.       Domenic Moras, PA-C 12/07/14 [place], MD 12/07/14 [phone]

## 2014-12-07 NOTE — ED Notes (Signed)
Per pt, states fell on Tuesday and broke fall with left wrist-swollen and painful

## 2014-12-07 NOTE — Discharge Instructions (Signed)
You have broken your left wrist.  Please follow up with Dr. Burney Gauze next week for further care.  Follow instruction below.   Wrist Fracture A wrist fracture is a break or crack in one of the bones of your wrist. Your wrist is made up of eight small bones at the palm of your hand (carpal bones) and two long bones that make up your forearm (radius and ulna).  CAUSES   A direct blow to the wrist.  Falling on an outstretched hand.  Trauma, such as a car accident or a fall. RISK FACTORS Risk factors for wrist fracture include:   Participating in contact and high-risk sports, such as skiing, biking, and ice skating.  Taking steroid medicines.  Smoking.  Being female.  Being Caucasian.  Drinking more than three alcoholic beverages per day.  Having low or lowered bone density (osteoporosis or osteopenia).  Age. Older adults have decreased bone density.  Women who have had menopause.  History of previous fractures. SIGNS AND SYMPTOMS Symptoms of wrist fractures include tenderness, bruising, and inflammation. Additionally, the wrist may hang in an odd position or appear deformed.  DIAGNOSIS Diagnosis may include:  Physical exam.  X-ray. TREATMENT Treatment depends on many factors, including the nature and location of the fracture, your age, and your activity level. Treatment for wrist fracture can be nonsurgical or surgical.  Nonsurgical Treatment A plaster cast or splint may be applied to your wrist if the bone is in a good position. If the fracture is not in good position, it may be necessary for your health care provider to realign it before applying a splint or cast. Usually, a cast or splint will be worn for several weeks.  Surgical Treatment Sometimes the position of the bone is so far out of place that surgery is required to apply a device to hold it together as it heals. Depending on the fracture, there are a number of options for holding the bone in place while it heals,  such as a cast and metal pins.  HOME CARE INSTRUCTIONS  Keep your injured wrist elevated and move your fingers as much as possible.  Do not put pressure on any part of your cast or splint. It may break.   Use a plastic bag to protect your cast or splint from water while bathing or showering. Do not lower your cast or splint into water.  Take medicines only as directed by your health care provider.  Keep your cast or splint clean and dry. If it becomes wet, damaged, or suddenly feels too tight, contact your health care provider right away.  Do not use any tobacco products including cigarettes, chewing tobacco, or electronic cigarettes. Tobacco can delay bone healing. If you need help quitting, ask your health care provider.  Keep all follow-up visits as directed by your health care provider. This is important.  Ask your health care provider if you should take supplements of calcium and vitamins C and D to promote bone healing. SEEK MEDICAL CARE IF:   Your cast or splint is damaged, breaks, or gets wet.  You have a fever.  You have chills.  You have continued severe pain or more swelling than you did before the cast was put on. SEEK IMMEDIATE MEDICAL CARE IF:   Your hand or fingernails on the injured arm turn blue or gray, or feel cold or numb.  You have decreased feeling in the fingers of your injured arm. MAKE SURE YOU:  Understand these instructions.  Will watch your condition.  Will get help right away if you are not doing well or get worse. Document Released: 01/08/2005 Document Revised: 08/15/2013 Document Reviewed: 04/18/2011 [facility] Patient Information 2015 [place], [place]. This information is not intended to replace advice given to you by your health care provider. Make sure you discuss any questions you have with your health care provider.

## 2014-12-07 NOTE — ED Notes (Signed)
Dr Burney Gauze in ortho called for Dr Gertie Fey...klj

## 2014-12-10 IMAGING — CR DG CHEST 1V PORT
1 series · 1 of 1 positions shown · non-contrast
Comparison: 05/05/2013

CLINICAL DATA: Fever.  Hypertension

EXAM:
PORTABLE CHEST - 1 VIEW

[AP]
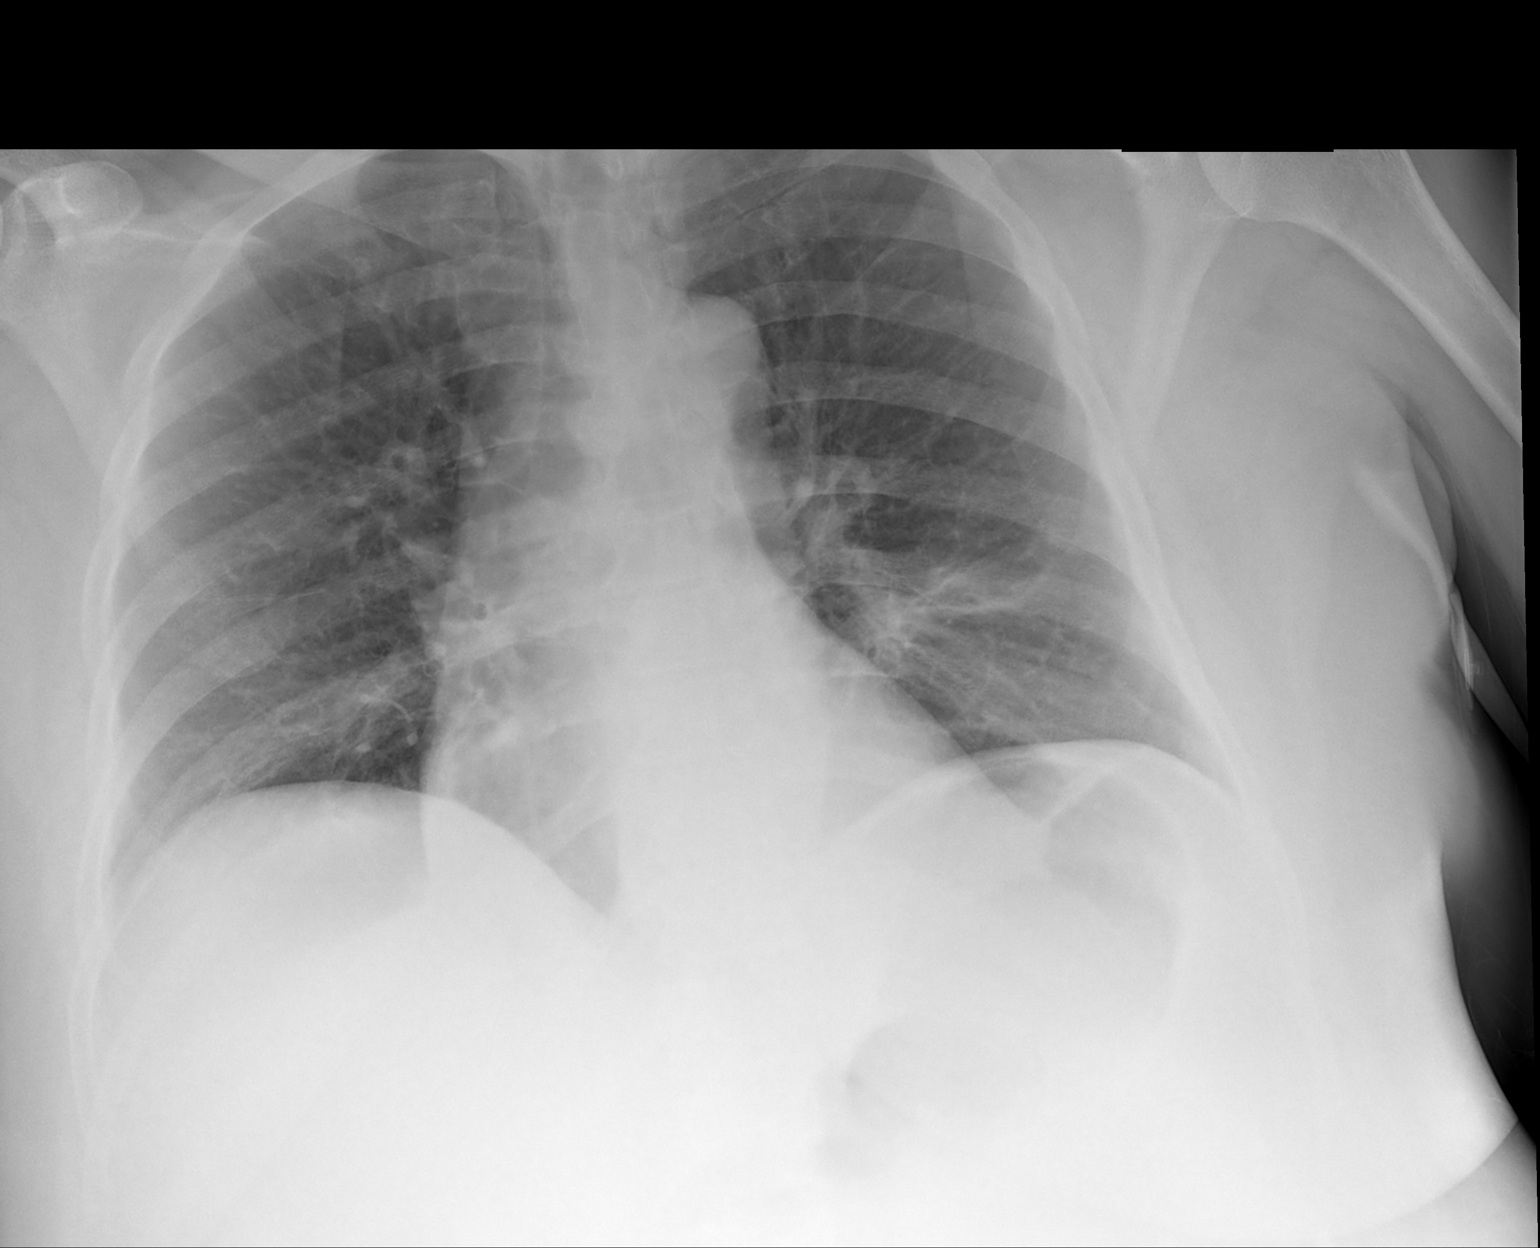

[1 of 1 positions shown; findings below may reference images not displayed]

FINDINGS: Mild reticular opacity extends from the inferior left hilum, likely
subsegmental atelectasis. There is no convincing infiltrate. The
lungs are otherwise clear.

There is a small, curved, wire like density that is superimposed
over the medial right lung base. This was not present previously. It
may reside external to this patient

Lungs are otherwise clear. No pleural effusion or pneumothorax.
Normal heart, mediastinum and hila.
IMPRESSION: No acute cardiopulmonary disease.

## 2014-12-19 ENCOUNTER — Ambulatory Visit (AMBULATORY_SURGERY_CENTER): Payer: Medicare Other

## 2014-12-19 VITALS — Ht 66.0 in | Wt 177.2 lb

## 2014-12-19 DIAGNOSIS — K529 Noninfective gastroenteritis and colitis, unspecified: Secondary | ICD-10-CM

## 2014-12-19 NOTE — Progress Notes (Signed)
No allergies to eggs or soy No past problems with anesthesia No diet/weight los meds No home oxygen  Refused emmi

## 2015-01-02 ENCOUNTER — Encounter: Payer: Medicare Other | Admitting: Internal Medicine

## 2015-01-09 ENCOUNTER — Emergency Department (HOSPITAL_COMMUNITY): Payer: Medicare Other | Admitting: Certified Registered"

## 2015-01-09 ENCOUNTER — Emergency Department (HOSPITAL_COMMUNITY): Payer: Medicare Other

## 2015-01-09 ENCOUNTER — Inpatient Hospital Stay: Admit: 2015-01-09 | Payer: Self-pay | Admitting: General Surgery

## 2015-01-09 ENCOUNTER — Ambulatory Visit (AMBULATORY_SURGERY_CENTER): Payer: Medicare Other | Admitting: Gastroenterology

## 2015-01-09 ENCOUNTER — Encounter (HOSPITAL_COMMUNITY): Admission: EM | Disposition: A | Discharge status: Home Health | Payer: Self-pay | Source: Home / Self Care

## 2015-01-09 ENCOUNTER — Encounter: Payer: Self-pay | Admitting: Gastroenterology

## 2015-01-09 ENCOUNTER — Encounter (HOSPITAL_COMMUNITY): Payer: Self-pay | Admitting: *Deleted

## 2015-01-09 ENCOUNTER — Inpatient Hospital Stay (HOSPITAL_COMMUNITY)
Admission: EM | Admit: 2015-01-09 | Discharge: 2015-01-22 | DRG: 329 | Disposition: A | Discharge status: Home Health | Payer: Medicare Other | Attending: General Surgery | Admitting: General Surgery

## 2015-01-09 VITALS — BP 173/97 | HR 71 | Temp 96.5°F | Resp 24 | Ht 66.0 in | Wt 177.0 lb

## 2015-01-09 DIAGNOSIS — D649 Anemia, unspecified: Secondary | ICD-10-CM | POA: Diagnosis not present

## 2015-01-09 DIAGNOSIS — K627 Radiation proctitis: Secondary | ICD-10-CM | POA: Diagnosis present

## 2015-01-09 DIAGNOSIS — Z885 Allergy status to narcotic agent status: Secondary | ICD-10-CM

## 2015-01-09 DIAGNOSIS — K635 Polyp of colon: Secondary | ICD-10-CM | POA: Diagnosis not present

## 2015-01-09 DIAGNOSIS — F17210 Nicotine dependence, cigarettes, uncomplicated: Secondary | ICD-10-CM | POA: Diagnosis present

## 2015-01-09 DIAGNOSIS — Z880 Allergy status to penicillin: Secondary | ICD-10-CM

## 2015-01-09 DIAGNOSIS — K435 Parastomal hernia without obstruction or  gangrene: Secondary | ICD-10-CM | POA: Diagnosis present

## 2015-01-09 DIAGNOSIS — Z79891 Long term (current) use of opiate analgesic: Secondary | ICD-10-CM

## 2015-01-09 DIAGNOSIS — Z833 Family history of diabetes mellitus: Secondary | ICD-10-CM

## 2015-01-09 DIAGNOSIS — R1084 Generalized abdominal pain: Secondary | ICD-10-CM | POA: Diagnosis not present

## 2015-01-09 DIAGNOSIS — Z85048 Personal history of other malignant neoplasm of rectum, rectosigmoid junction, and anus: Secondary | ICD-10-CM | POA: Diagnosis not present

## 2015-01-09 DIAGNOSIS — E876 Hypokalemia: Secondary | ICD-10-CM | POA: Diagnosis not present

## 2015-01-09 DIAGNOSIS — G620 Drug-induced polyneuropathy: Secondary | ICD-10-CM | POA: Diagnosis present

## 2015-01-09 DIAGNOSIS — K5140 Inflammatory polyps of colon without complications: Secondary | ICD-10-CM | POA: Diagnosis not present

## 2015-01-09 DIAGNOSIS — F1420 Cocaine dependence, uncomplicated: Secondary | ICD-10-CM | POA: Diagnosis present

## 2015-01-09 DIAGNOSIS — K660 Peritoneal adhesions (postprocedural) (postinfection): Secondary | ICD-10-CM | POA: Diagnosis present

## 2015-01-09 DIAGNOSIS — Y838 Other surgical procedures as the cause of abnormal reaction of the patient, or of later complication, without mention of misadventure at the time of the procedure: Secondary | ICD-10-CM | POA: Diagnosis not present

## 2015-01-09 DIAGNOSIS — D126 Benign neoplasm of colon, unspecified: Secondary | ICD-10-CM | POA: Diagnosis not present

## 2015-01-09 DIAGNOSIS — Z86010 Personal history of colonic polyps: Secondary | ICD-10-CM

## 2015-01-09 DIAGNOSIS — Z933 Colostomy status: Secondary | ICD-10-CM

## 2015-01-09 DIAGNOSIS — Z923 Personal history of irradiation: Secondary | ICD-10-CM

## 2015-01-09 DIAGNOSIS — T451X5A Adverse effect of antineoplastic and immunosuppressive drugs, initial encounter: Secondary | ICD-10-CM | POA: Diagnosis present

## 2015-01-09 DIAGNOSIS — I10 Essential (primary) hypertension: Secondary | ICD-10-CM | POA: Diagnosis present

## 2015-01-09 DIAGNOSIS — K529 Noninfective gastroenteritis and colitis, unspecified: Secondary | ICD-10-CM

## 2015-01-09 DIAGNOSIS — Z4682 Encounter for fitting and adjustment of non-vascular catheter: Secondary | ICD-10-CM | POA: Diagnosis not present

## 2015-01-09 DIAGNOSIS — Z8249 Family history of ischemic heart disease and other diseases of the circulatory system: Secondary | ICD-10-CM

## 2015-01-09 DIAGNOSIS — K631 Perforation of intestine (nontraumatic): Secondary | ICD-10-CM | POA: Diagnosis present

## 2015-01-09 DIAGNOSIS — Z800 Family history of malignant neoplasm of digestive organs: Secondary | ICD-10-CM

## 2015-01-09 DIAGNOSIS — F329 Major depressive disorder, single episode, unspecified: Secondary | ICD-10-CM | POA: Diagnosis not present

## 2015-01-09 DIAGNOSIS — T8132XA Disruption of internal operation (surgical) wound, not elsewhere classified, initial encounter: Secondary | ICD-10-CM | POA: Diagnosis not present

## 2015-01-09 DIAGNOSIS — K659 Peritonitis, unspecified: Secondary | ICD-10-CM | POA: Diagnosis present

## 2015-01-09 DIAGNOSIS — R159 Full incontinence of feces: Secondary | ICD-10-CM | POA: Diagnosis present

## 2015-01-09 DIAGNOSIS — D72819 Decreased white blood cell count, unspecified: Secondary | ICD-10-CM | POA: Diagnosis not present

## 2015-01-09 DIAGNOSIS — D120 Benign neoplasm of cecum: Secondary | ICD-10-CM

## 2015-01-09 DIAGNOSIS — R109 Unspecified abdominal pain: Secondary | ICD-10-CM

## 2015-01-09 DIAGNOSIS — Y842 Radiological procedure and radiotherapy as the cause of abnormal reaction of the patient, or of later complication, without mention of misadventure at the time of the procedure: Secondary | ICD-10-CM | POA: Diagnosis present

## 2015-01-09 DIAGNOSIS — Z79899 Other long term (current) drug therapy: Secondary | ICD-10-CM

## 2015-01-09 HISTORY — PX: LAPAROTOMY: SHX154

## 2015-01-09 HISTORY — PX: APPENDECTOMY: SHX54

## 2015-01-09 HISTORY — PX: COLONOSCOPY: SHX174

## 2015-01-09 LAB — COMPREHENSIVE METABOLIC PANEL
ALT: 21 U/L (ref 14–54)
AST: 28 U/L (ref 15–41)
Albumin: 4.3 g/dL (ref 3.5–5.0)
Alkaline Phosphatase: 90 U/L (ref 38–126)
Anion gap: 10 (ref 5–15)
BUN: 7 mg/dL (ref 6–20)
CO2: 22 mmol/L (ref 22–32)
Calcium: 8.9 mg/dL (ref 8.9–10.3)
Chloride: 104 mmol/L (ref 101–111)
Creatinine, Ser: 0.76 mg/dL (ref 0.44–1.00)
GFR calc Af Amer: >60 mL/min (ref >60)
GFR calc non Af Amer: >60 mL/min (ref >60)
Glucose, Bld: 178 mg/dL — ABNORMAL HIGH (ref 65–99)
Potassium: 2.8 mmol/L — ABNORMAL LOW (ref 3.5–5.1)
Sodium: 136 mmol/L (ref 135–145)
Total Bilirubin: 0.9 mg/dL (ref 0.3–1.2)
Total Protein: 7.8 g/dL (ref 6.5–8.1)

## 2015-01-09 LAB — CBC WITH DIFFERENTIAL/PLATELET
Basophils Absolute: 0 10*3/uL (ref 0.0–0.1)
Basophils Relative: 0 %
Eosinophils Absolute: 0 10*3/uL (ref 0.0–0.7)
Eosinophils Relative: 0 %
HCT: 45.2 % (ref 36.0–46.0)
Hemoglobin: 15.2 g/dL — ABNORMAL HIGH (ref 12.0–15.0)
Lymphocytes Relative: 20 %
Lymphs Abs: 1.8 10*3/uL (ref 0.7–4.0)
MCH: 31.3 pg (ref 26.0–34.0)
MCHC: 33.6 g/dL (ref 30.0–36.0)
MCV: 93.2 fL (ref 78.0–100.0)
Monocytes Absolute: 0.4 10*3/uL (ref 0.1–1.0)
Monocytes Relative: 4 %
Neutro Abs: 6.8 10*3/uL (ref 1.7–7.7)
Neutrophils Relative %: 76 %
Platelets: 335 10*3/uL (ref 150–400)
RBC: 4.85 MIL/uL (ref 3.87–5.11)
RDW: 14 % (ref 11.5–15.5)
WBC: 9 10*3/uL (ref 4.0–10.5)

## 2015-01-09 SURGERY — LAPAROTOMY, EXPLORATORY
Anesthesia: General | Site: Abdomen

## 2015-01-09 MED ORDER — LIDOCAINE HCL (PF) 2 % IJ SOLN
INTRAMUSCULAR | Status: DC | PRN
  Administered 2015-01-09: 40 mg via INTRADERMAL

## 2015-01-09 MED ORDER — POTASSIUM CHLORIDE 10 MEQ/100ML IV SOLN: 10.0000 meq | Freq: Once | INTRAVENOUS | Status: DC

## 2015-01-09 MED ORDER — FENTANYL CITRATE (PF) 100 MCG/2ML IJ SOLN
INTRAMUSCULAR | Status: DC | PRN
  Administered 2015-01-09 (×5): 50 ug via INTRAVENOUS

## 2015-01-09 MED ORDER — IOHEXOL 300 MG/ML  SOLN: 25.0000 mL | Freq: Once | INTRAMUSCULAR | Status: DC | PRN

## 2015-01-09 MED ORDER — MIDAZOLAM HCL 2 MG/2ML IJ SOLN
INTRAMUSCULAR | Status: AC
  Filled 2015-01-09: qty 4

## 2015-01-09 MED ORDER — CIPROFLOXACIN IN D5W 400 MG/200ML IV SOLN
400.0000 mg | Freq: Two times a day (BID) | INTRAVENOUS | Status: DC
  Administered 2015-01-09 – 2015-01-19 (×20): 400 mg via INTRAVENOUS
  Filled 2015-01-09 (×21): qty 200

## 2015-01-09 MED ORDER — ONDANSETRON HCL 4 MG/2ML IJ SOLN
INTRAMUSCULAR | Status: AC
  Filled 2015-01-09: qty 2

## 2015-01-09 MED ORDER — METRONIDAZOLE IN NACL 5-0.79 MG/ML-% IV SOLN
500.0000 mg | Freq: Three times a day (TID) | INTRAVENOUS | Status: DC
  Administered 2015-01-09 – 2015-01-19 (×28): 500 mg via INTRAVENOUS
  Filled 2015-01-09 (×30): qty 100

## 2015-01-09 MED ORDER — LIDOCAINE HCL 2 % EX GEL
CUTANEOUS | Status: AC
  Administered 2015-01-09: 11
  Filled 2015-01-09: qty 5

## 2015-01-09 MED ORDER — ONDANSETRON HCL 4 MG/2ML IJ SOLN
4.0000 mg | Freq: Once | INTRAMUSCULAR | Status: AC
  Administered 2015-01-09: 4 mg via INTRAVENOUS
  Filled 2015-01-09: qty 2

## 2015-01-09 MED ORDER — MIDAZOLAM HCL 5 MG/5ML IJ SOLN
INTRAMUSCULAR | Status: DC | PRN
  Administered 2015-01-09: 2 mg via INTRAVENOUS

## 2015-01-09 MED ORDER — GLYCOPYRROLATE 0.2 MG/ML IJ SOLN
INTRAMUSCULAR | Status: AC
  Filled 2015-01-09: qty 3

## 2015-01-09 MED ORDER — PROPOFOL 10 MG/ML IV BOLUS
INTRAVENOUS | Status: AC
Start: 2015-01-09 — End: 2015-01-09
  Filled 2015-01-09: qty 20

## 2015-01-09 MED ORDER — ROCURONIUM BROMIDE 100 MG/10ML IV SOLN
INTRAVENOUS | Status: DC | PRN
  Administered 2015-01-09: 10 mg via INTRAVENOUS
  Administered 2015-01-09: 5 mg via INTRAVENOUS
  Administered 2015-01-09: 40 mg via INTRAVENOUS

## 2015-01-09 MED ORDER — ROCURONIUM BROMIDE 100 MG/10ML IV SOLN
INTRAVENOUS | Status: AC
  Filled 2015-01-09: qty 1

## 2015-01-09 MED ORDER — SODIUM CHLORIDE 0.9 % IV SOLN: 500.0000 mL | INTRAVENOUS | Status: DC

## 2015-01-09 MED ORDER — PHENYLEPHRINE HCL 10 MG/ML IJ SOLN
INTRAMUSCULAR | Status: AC
  Filled 2015-01-09: qty 1

## 2015-01-09 MED ORDER — GLYCOPYRROLATE 0.2 MG/ML IJ SOLN
INTRAMUSCULAR | Status: DC | PRN
  Administered 2015-01-09: .6 mg via INTRAVENOUS

## 2015-01-09 MED ORDER — LACTATED RINGERS IV SOLN
INTRAVENOUS | Status: DC | PRN
  Administered 2015-01-09: 21:00:00 via INTRAVENOUS

## 2015-01-09 MED ORDER — FENTANYL CITRATE (PF) 250 MCG/5ML IJ SOLN
INTRAMUSCULAR | Status: AC
  Filled 2015-01-09: qty 25

## 2015-01-09 MED ORDER — SUCCINYLCHOLINE CHLORIDE 20 MG/ML IJ SOLN
INTRAMUSCULAR | Status: DC | PRN
  Administered 2015-01-09: 100 mg via INTRAVENOUS

## 2015-01-09 MED ORDER — PROPOFOL 10 MG/ML IV BOLUS
INTRAVENOUS | Status: DC | PRN
  Administered 2015-01-09: 120 mg via INTRAVENOUS

## 2015-01-09 MED ORDER — NEOSTIGMINE METHYLSULFATE 10 MG/10ML IV SOLN
INTRAVENOUS | Status: DC | PRN
  Administered 2015-01-09: 4 mg via INTRAVENOUS

## 2015-01-09 MED ORDER — 0.9 % SODIUM CHLORIDE (POUR BTL) OPTIME
TOPICAL | Status: DC | PRN
  Administered 2015-01-09: 2000 mL

## 2015-01-09 MED ORDER — SODIUM CHLORIDE 0.9 % IV BOLUS (SEPSIS)
1000.0000 mL | Freq: Once | INTRAVENOUS | Status: AC
  Administered 2015-01-09: 1000 mL via INTRAVENOUS

## 2015-01-09 MED ORDER — HYDROMORPHONE HCL 1 MG/ML IJ SOLN
1.0000 mg | Freq: Once | INTRAMUSCULAR | Status: AC
  Administered 2015-01-09: 1 mg via INTRAVENOUS
  Filled 2015-01-09: qty 1

## 2015-01-09 MED ORDER — HYDROMORPHONE HCL 1 MG/ML IJ SOLN
INTRAMUSCULAR | Status: DC | PRN
  Administered 2015-01-09 – 2015-01-10 (×2): .4 mg via INTRAVENOUS

## 2015-01-09 MED ORDER — HYDROMORPHONE HCL 2 MG/ML IJ SOLN
INTRAMUSCULAR | Status: AC
  Filled 2015-01-09: qty 1

## 2015-01-09 MED ORDER — PHENYLEPHRINE HCL 10 MG/ML IJ SOLN
INTRAMUSCULAR | Status: DC | PRN
  Administered 2015-01-09 (×7): 80 ug via INTRAVENOUS

## 2015-01-09 MED ORDER — PHENYLEPHRINE 40 MCG/ML (10ML) SYRINGE FOR IV PUSH (FOR BLOOD PRESSURE SUPPORT)
PREFILLED_SYRINGE | INTRAVENOUS | Status: AC
  Filled 2015-01-09: qty 10

## 2015-01-09 MED ORDER — ONDANSETRON HCL 4 MG/2ML IJ SOLN
INTRAMUSCULAR | Status: AC
  Administered 2015-01-09: 4 mg
  Filled 2015-01-09: qty 2

## 2015-01-09 MED ORDER — LORAZEPAM 2 MG/ML IJ SOLN
0.5000 mg | Freq: Once | INTRAMUSCULAR | Status: AC
  Administered 2015-01-09: 0.5 mg via INTRAVENOUS
  Filled 2015-01-09: qty 1

## 2015-01-09 MED ORDER — ONDANSETRON HCL 4 MG/2ML IJ SOLN
INTRAMUSCULAR | Status: DC | PRN
  Administered 2015-01-09: 4 mg via INTRAVENOUS

## 2015-01-09 MED ORDER — NEOSTIGMINE METHYLSULFATE 10 MG/10ML IV SOLN
INTRAVENOUS | Status: AC
  Filled 2015-01-09: qty 1

## 2015-01-09 SURGICAL SUPPLY — 46 items
APPLICATOR COTTON TIP 6IN STRL (MISCELLANEOUS) IMPLANT
BLADE EXTENDED COATED 6.5IN (ELECTRODE) ×2 IMPLANT
BLADE HEX COATED 2.75 (ELECTRODE) ×2 IMPLANT
BNDG GAUZE ELAST 4 BULKY (GAUZE/BANDAGES/DRESSINGS) ×2 IMPLANT
COVER MAYO STAND STRL (DRAPES) ×2 IMPLANT
COVER SURGICAL LIGHT HANDLE (MISCELLANEOUS) ×4 IMPLANT
DEVICE SECURE STRAP 25 ABSORB (INSTRUMENTS) ×2 IMPLANT
DRAIN CHANNEL 19F RND (DRAIN) ×4 IMPLANT
DRAPE LAPAROSCOPIC ABDOMINAL (DRAPES) ×2 IMPLANT
DRAPE WARM FLUID 44X44 (DRAPE) ×2 IMPLANT
DRSG PAD ABDOMINAL 8X10 ST (GAUZE/BANDAGES/DRESSINGS) ×2 IMPLANT
ELECT REM PT RETURN 9FT ADLT (ELECTROSURGICAL) ×2
ELECTRODE REM PT RTRN 9FT ADLT (ELECTROSURGICAL) ×1 IMPLANT
EVACUATOR DRAINAGE 7X20 100CC (MISCELLANEOUS) ×2 IMPLANT
EVACUATOR SILICONE 100CC (MISCELLANEOUS) ×2
GAUZE SPONGE 4X4 12PLY STRL (GAUZE/BANDAGES/DRESSINGS) ×2 IMPLANT
GAUZE SPONGE 4X4 16PLY XRAY LF (GAUZE/BANDAGES/DRESSINGS) ×2 IMPLANT
GLOVE BIOGEL PI IND STRL 7.0 (GLOVE) ×1 IMPLANT
GLOVE BIOGEL PI INDICATOR 7.0 (GLOVE) ×1
GOWN STRL REUS W/TWL LRG LVL3 (GOWN DISPOSABLE) ×2 IMPLANT
GOWN STRL REUS W/TWL XL LVL3 (GOWN DISPOSABLE) ×4 IMPLANT
GRAFT FLEX HD 12X12 THICK (Tissue Mesh) ×2 IMPLANT
KIT BASIN OR (CUSTOM PROCEDURE TRAY) ×2 IMPLANT
NS IRRIG 1000ML POUR BTL (IV SOLUTION) ×2 IMPLANT
PACK GENERAL/GYN (CUSTOM PROCEDURE TRAY) ×2 IMPLANT
RELOAD STAPLER LINEAR PROX 30 (STAPLE) ×1 IMPLANT
SPONGE DRAIN TRACH 4X4 STRL 2S (GAUZE/BANDAGES/DRESSINGS) ×4 IMPLANT
SPONGE LAP 18X18 X RAY DECT (DISPOSABLE) ×6 IMPLANT
STAPLER PROXIMATE 75MM BLUE (STAPLE) ×2 IMPLANT
STAPLER RELOAD LINEAR PROX 30 (STAPLE) ×2
STAPLER VISISTAT 35W (STAPLE) ×2 IMPLANT
SUCTION POOLE TIP (SUCTIONS) ×2 IMPLANT
SUT NOVA NAB DX-16 0-1 5-0 T12 (SUTURE) ×6 IMPLANT
SUT PDS AB 1 CTX 36 (SUTURE) IMPLANT
SUT PDS AB 1 TP1 96 (SUTURE) ×4 IMPLANT
SUT SILK 2 0 (SUTURE) ×2
SUT SILK 2 0 SH CR/8 (SUTURE) ×2 IMPLANT
SUT SILK 2-0 18XBRD TIE 12 (SUTURE) ×2 IMPLANT
SUT SILK 3 0 (SUTURE) ×1
SUT SILK 3 0 SH CR/8 (SUTURE) ×2 IMPLANT
SUT SILK 3-0 18XBRD TIE 12 (SUTURE) ×1 IMPLANT
SUT VIC AB 3-0 SH 18 (SUTURE) ×2 IMPLANT
TAPE CLOTH SURG 6X10 WHT LF (GAUZE/BANDAGES/DRESSINGS) ×2 IMPLANT
TOWEL OR 17X26 10 PK STRL BLUE (TOWEL DISPOSABLE) ×4 IMPLANT
TRAY FOLEY W/METER SILVER 14FR (SET/KITS/TRAYS/PACK) ×2 IMPLANT
YANKAUER SUCT BULB TIP NO VENT (SUCTIONS) ×2 IMPLANT

## 2015-01-09 NOTE — Patient Instructions (Addendum)
YOU HAD AN ENDOSCOPIC PROCEDURE TODAY AT THE [facility]:   Refer to the procedure report that was given to you for any specific questions about what was found during the examination.  If the procedure report does not answer your questions, please call your gastroenterologist to clarify.  If you requested that your care partner not be given the details of your procedure findings, then the procedure report has been included in a sealed envelope for you to review at your convenience later.  YOU SHOULD EXPECT: Some feelings of bloating in the abdomen. Passage of more gas than usual.  Walking can help get rid of the air that was put into your GI tract during the procedure and reduce the bloating. If you had a lower endoscopy (such as a colonoscopy or flexible sigmoidoscopy) you may notice spotting of blood in your stool or on the toilet paper. If you underwent a bowel prep for your procedure, you may not have a normal bowel movement for a few days.  Please Note:  You might notice some irritation and congestion in your nose or some drainage.  This is from the oxygen used during your procedure.  There is no need for concern and it should clear up in a day or so.  SYMPTOMS TO REPORT IMMEDIATELY:   Following lower endoscopy (colonoscopy or flexible sigmoidoscopy):  Excessive amounts of blood in the stool  Significant tenderness or worsening of abdominal pains  Swelling of the abdomen that is new, acute  Fever of 100F or higher  For urgent or emergent issues, a gastroenterologist can be reached at any hour by calling [phone].   DIET: Your first meal following the procedure should be a small meal and then it is ok to progress to your normal diet. Heavy or fried foods are harder to digest and may make you feel nauseous or bloated.  Likewise, meals heavy in dairy and vegetables can increase bloating.  Drink plenty of fluids but you should avoid alcoholic beverages for 24  hours.  ACTIVITY:  You should plan to take it easy for the rest of today and you should NOT DRIVE or use heavy machinery until tomorrow (because of the sedation medicines used during the test).    FOLLOW UP: Our staff will call the number listed on your records the next business day following your procedure to check on you and address any questions or concerns that you may have regarding the information given to you following your procedure. If we do not reach you, we will leave a message.  However, if you are feeling well and you are not experiencing any problems, there is no need to return our call.  We will assume that you have returned to your regular daily activities without incident.  If any biopsies were taken you will be contacted by phone or by letter within the next 1-3 weeks.  Please call us at [phone] if you have not heard about the biopsies in 3 weeks.    SIGNATURES/CONFIDENTIALITY: You and/or your care partner have signed paperwork which will be entered into your electronic medical record.  These signatures attest to the fact that that the information above on your After Visit Summary has been reviewed and is understood.  Full responsibility of the confidentiality of this discharge information lies with you and/or your care-partner.    Handouts were given to your care partner on polyps, diverticulosis, and a high fiber diet with liberal fluid intake. You may resume your   your current medications today. Await biopsy results. Follow up with Dr. Marcello Moores for removal of suspected polyps along the stoma.  Per Dr. Havery Moros, you can contact Dr. Marcello Moores. Please call if any questions or concerns.

## 2015-01-09 NOTE — Progress Notes (Signed)
Per Dr. Havery Moros He spoke with Dr. Marcello Moores and she goes off call now.    Dr. Havery Moros will call the ED and speak with MD in ED.  EMS is call for emergency transport.  Dr. Havery Moros speaking with pt and her sister now. maw

## 2015-01-09 NOTE — ED Notes (Signed)
Patient had an endoscopy via her stoma earlier today and after the procedure finished, began to have severe abdominal pain.  Patient presents to ED with nausea and active vomiting.  Patient's abdomen is distended with pronounced, bulging stoma.  Patient has bowel sounds.  Patient's abdomen is painful to palpation.  Patient's lung sounds are clear to auscultation.  Heart sounds WNL S1/S2, RRR, no murmur, rub or gallop.  +2 radial and pedal pulses.

## 2015-01-09 NOTE — Progress Notes (Signed)
To recovery, report to Willis, RN, VSS 

## 2015-01-09 NOTE — Op Note (Signed)
[facility]  [address] [place], [address]   COLONOSCOPY PROCEDURE REPORT  PATIENT: [name], [name]  MR#: [id number] BIRTHDATE: April 13, 1956 , 17  yrs. old GENDER: female ENDOSCOPIST: [place] Cellar, MD REFERRED BY: Gwendolyn Grant, MD, Leighton Ruff MD PROCEDURE DATE:  01/09/2015 PROCEDURE:   Colonoscopy with biopsy and Colonoscopy, surveillance First Screening Colonoscopy - Avg.  risk and is 50 yrs.  old or older - No.  Prior Negative Screening - Now for repeat screening. N/A  History of Adenoma - Now for follow-up colonoscopy & has been > or = to 3 yrs.  Yes hx of adenoma.  Has been 3 or more years since last colonoscopy.  Polyps removed today? 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 MEDICATIONS: Propofol 350 mg IV  DESCRIPTION OF PROCEDURE:   After the risks benefits and alternatives of the procedure were thoroughly explained, informed consent was obtained.  The digital rectal exam revealed no abnormalities of the rectum.   The LB [id number]  endoscope was introduced through the anus and advanced to the sigmoid colon. The ostomy was then intubated and endoscope advanced to the cecum, which was identified by both the appendix and ileocecal valve. No adverse events experienced.   The quality of the prep was adequate in the right colon but inadequate in the left colon  The instrument was then slowly withdrawn as the colon was fully examined. Estimated blood loss is zero unless otherwise noted in this procedure report.  COLON FINDINGS: The patient has a colostomy with an afferent and efferent limb.  Only the proximal limb was able to be intubated and traverse the ostomy.  There was severe diverticulosis noted in the promixal limb.  A 68mm polyp was noted within the appendiceal orifice and removed with cold forceps.  No other polyps were appreciated in the proximal limb.   The endoscope then intubated the anus and the rectum and sigmoid colon were examined.  There was severe diverticulosis and evidence of diversion colitis within the left colon, as evidenced by pale mucosa with contact bleeding and friability.  The preparation in the left colon was poor and not completely able to be examined up to the point of the ostomy given the poor preparation.  On examination of the stomach, polyps were noted along the mucosa with friability, the largest over 1cm in size.  These were not removed with the endoscope.  Retroflexion was not performed due to a narrow rectal vault. The time to cecum = Withdrawal time =      The scope was withdrawn and the procedure completed.  COMPLICATIONS: There were no immediate complications.  ENDOSCOPIC IMPRESSION: Small polyp within the appendiceal orifice, removed with cold forceps. Severe diverticulosis of the left and right colon. Diversion colitis of the left colon. The left colon was incompletely evaluated due to poor preparation and diversion colitis. Suspected polyps noted along the surface of the stoma  RECOMMENDATIONS: Await pathology results Resume diet Resume medications Follow up with surgery (Dr. Marcello Moores) for removal of suspected polyps along the stoma.  eSigned:  [place] Cellar, MD 11/94/1740 8:14 PM   cc: Leighton Ruff MD, Gwendolyn Grant MD, the patient   PATIENT NAME:  [name], [name] MR#: [id number]

## 2015-01-09 NOTE — Progress Notes (Signed)
Pt c/o abdominal cramping.  She has not passed any flatus yet.  Pt taken off the monitor after 20 mins and assisted to the restroom to sit on the toilet at her request.  Clean gown placed on pt first. maw

## 2015-01-09 NOTE — Progress Notes (Signed)
Pt c/o of nause and vomited up small amount of clear foamy liquid.  [place] Dr. Havery Moros notified pt has not passed flatus and seems more distended around stoma.  Pt back to bed and lying on her right side latterly with her legs drawn up.  Rating pain 10/10.    Dr. Duanne Guess  Placed his finger into the stoma and slid the tube into stoma but no air was passed.  MD calling Dr. Marcello Moores, pt's surgeon.   Maw

## 2015-01-09 NOTE — Progress Notes (Signed)
Called to room to assist during endoscopic procedure.  Patient ID and intended procedure confirmed with present staff. Received instructions for my participation in the procedure from the performing physician.  

## 2015-01-09 NOTE — ED Provider Notes (Signed)
CSN: [id number]     Arrival date & time 01/09/15  1741 History   First MD Initiated Contact with Patient 01/09/15 1751     Chief Complaint  Patient presents with  . Abdominal Pain     (Consider location/radiation/quality/duration/timing/severity/associated sxs/prior Treatment) Patient is a 59 y.o. female presenting with abdominal pain. The history is provided by the patient (Patient states that after her colonoscopy today she started having severe abdominal pain. Her GI doctor sent her to the emergency room for evaluation.).  Abdominal Pain Pain location:  Generalized Pain quality: aching   Pain radiates to:  Does not radiate Pain severity:  Moderate Onset quality:  Sudden Timing:  Constant Progression:  Worsening Chronicity:  New Associated symptoms: no chest pain, no cough, no diarrhea, no fatigue and no hematuria     Past Medical History  Diagnosis Date  . 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 Hypertension   . Diverticulosis   . Radiation proctitis     mild  . Adenomatous colon polyp   . Anemia, unspecified   . Depression   . Nonspecific colitis 10/10/11  . Cocaine abuse     as recently as 10/21/11  . Hernia 03/18/2013  . Incontinence of bowel     since colostomy  . History of transfusion   . Difficulty sleeping     takes meds to sleep  . Knee fracture, left   . 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 Mouth surgery  10/2009  . Low anterior bowel resection  03/07/09  . Colostomy  2013    [facility]  . Portacath placement      and removal  . Laparoscopic parastomal hernia N/A 05/12/2013    Procedure: LAPAROSCOPIC PARASTOMAL HERNIA;  Surgeon: Leighton Ruff, MD;  Location: [facility];  Service: General;  Laterality: N/A;  . Hernia repair    . Finger fracture surgery Left     and hand surgery   Family History  Problem Relation Age of Onset  . Colon cancer Mother   . Stomach cancer  Sister   . Colon polyps Sister   . Diabetes Father   . Cirrhosis Brother   . Pulmonary Hypertension Brother    Social History  Substance Use Topics  . Smoking status: Current Every Day Smoker -- 0.25 packs/day for 40 years    Types: Cigarettes  . Smokeless tobacco: Never Used  . Alcohol Use: 0.0 oz/week    0 Standard drinks or equivalent per week     Comment: occasional  driink   OB History    No data available     Review of Systems  Constitutional: Negative for appetite change and fatigue.  HENT: Negative for congestion, ear discharge and sinus pressure.   Eyes: Negative for discharge.  Respiratory: Negative for cough.   Cardiovascular: Negative for chest pain.  Gastrointestinal: Positive for abdominal pain. Negative for diarrhea.  Genitourinary: Negative for frequency and hematuria.  Musculoskeletal: Negative for back pain.  Skin: Negative for rash.  Neurological: Negative for seizures and headaches.  Psychiatric/Behavioral: Negative for hallucinations.      Allergies  Ampicillin; Hydrocodone; Penicillins; and Grapeseed extract  Home Medications   Prior to Admission medications   Medication Sig Start Date End Date Taking? Authorizing Provider  ALPRAZolam Duanne Moron) 0.5 MG tablet Take 1 tablet (0.5 mg total) by mouth 2 (two) times daily as needed for anxiety  or sleep. 11/13/14  Yes Rowe Clack, MD  amitriptyline (ELAVIL) 25 MG tablet Take 1 tablet (25 mg total) by mouth at bedtime as needed for sleep. 09/05/14  Yes Rowe Clack, MD  citalopram (CELEXA) 20 MG tablet Take 1 tablet (20 mg total) by mouth every morning. 09/05/14  Yes Rowe Clack, MD  gabapentin (NEURONTIN) 400 MG capsule Take 3 capsules (1,200 mg total) by mouth 3 (three) times daily. 09/05/14  Yes Rowe Clack, MD  hydrochlorothiazide (HYDRODIURIL) 25 MG tablet Take 25 mg by mouth every morning.   Yes Historical Provider, MD  lisinopril (PRINIVIL,ZESTRIL) 20 MG tablet Take 1 tablet (20  mg total) by mouth daily. When BP averages > 140/90 05/23/14  Yes Rowe Clack, MD  naproxen sodium (ANAPROX) 220 MG tablet Take 440 mg by mouth 2 (two) times daily as needed (pain).   Yes Historical Provider, MD  oxyCODONE-acetaminophen (PERCOCET/ROXICET) 5-325 MG per tablet Take 1 tablet by mouth every 6 (six) hours as needed for severe pain. 12/07/14  Yes Domenic Moras, PA-C   BP 168/78 mmHg  Pulse 93  Temp(Src) 97.6 F (36.4 C) (Oral)  Resp 21  SpO2 95% Physical Exam  Constitutional: She is oriented to person, place, and time. She appears well-developed.  HENT:  Head: Normocephalic.  Eyes: Conjunctivae and EOM are normal. No scleral icterus.  Neck: Neck supple. No thyromegaly present.  Cardiovascular: Normal rate and regular rhythm.  Exam reveals no gallop and no friction rub.   No murmur heard. Pulmonary/Chest: No stridor. She has no wheezes. She has no rales. She exhibits no tenderness.  Abdominal: She exhibits no distension. There is no tenderness. There is no rebound.  Ostomy  With distended tender abd  Musculoskeletal: Normal range of motion. She exhibits no edema.  Lymphadenopathy:    She has no cervical adenopathy.  Neurological: She is oriented to person, place, and time. She exhibits normal muscle tone. Coordination normal.  Skin: No rash noted. No erythema.  Psychiatric: She has a normal mood and affect. Her behavior is normal.    ED Course  Procedures (including critical care time) Labs Review Labs Reviewed  CBC WITH DIFFERENTIAL/PLATELET - Abnormal; Notable for the following:    Hemoglobin 15.2 (*)    All other components within normal limits  COMPREHENSIVE METABOLIC PANEL - Abnormal; Notable for the following:    Potassium 2.8 (*)    Glucose, Bld 178 (*)    All other components within normal limits    Imaging Review Dg Abd Portable 1v  01/09/2015   CLINICAL DATA:  Nasogastric tube confirmation  EXAM: PORTABLE ABDOMEN - 1 VIEW  COMPARISON:  None.  FINDINGS:  The bowel gas pattern is normal. Bowel markers are identified. Nasogastric tube is identified in the mid stomach. There is free air in the retroperitoneum this also free air beneath the right hemidiaphragm.  IMPRESSION: Nasogastric tube distal tip in the mid stomach.  Retroperitoneal air and free air in the abdomen. Clinical correlation is recommended. Recommend further evaluation with CT abdomen and pelvis.   Electronically Signed   By: Abelardo Diesel M.D.   On: 01/09/2015 19:25   I have personally reviewed and evaluated these images and lab results as part of my medical decision-making.   EKG Interpretation None      MDM   Final diagnoses:  Abdominal pain in female    Patient with abdominal pain and free air on x-rays. General surgeon Dr. Excell Seltzer has seen the patient and  will titrate the surgery for ruptured viscus    Milton Ferguson, MD 01/09/15 2045

## 2015-01-09 NOTE — Progress Notes (Signed)
Pt still has not passed any flatus yet.  Pt asked to sit on the toilet a little longer.  maw

## 2015-01-09 NOTE — Progress Notes (Signed)
Pt transported to the ED at [facility] by [facility] 17:32.  Corky Sing

## 2015-01-09 NOTE — Anesthesia Preprocedure Evaluation (Signed)
Anesthesia Evaluation  Patient identified by MRN, date of birth, ID band Patient awake    Reviewed: Allergy & Precautions, NPO status , Patient's Chart, lab work & pertinent test results  Airway Mallampati: II  TM Distance: >3 FB Neck ROM: Full    Dental no notable dental hx. (+) Edentulous Upper, Edentulous Lower   Pulmonary Current Smoker,    Pulmonary exam normal breath sounds clear to auscultation       Cardiovascular hypertension, Pt. on medications Normal cardiovascular exam Rhythm:Regular Rate:Normal     Neuro/Psych negative neurological ROS  negative psych ROS   GI/Hepatic negative GI ROS, (+)     substance abuse  cocaine use,   Endo/Other  negative endocrine ROS  Renal/GU negative Renal ROS  negative genitourinary   Musculoskeletal negative musculoskeletal ROS (+)   Abdominal   Peds negative pediatric ROS (+)  Hematology negative hematology ROS (+)   Anesthesia Other Findings   Reproductive/Obstetrics negative OB ROS                             Anesthesia Physical Anesthesia Plan  ASA: II and emergent  Anesthesia Plan: General   Post-op Pain Management:    Induction: Intravenous, Rapid sequence and Cricoid pressure planned  Airway Management Planned: Oral ETT  Additional Equipment:   Intra-op Plan:   Post-operative Plan: Extubation in OR  Informed Consent: I have reviewed the patients History and Physical, chart, labs and discussed the procedure including the risks, benefits and alternatives for the proposed anesthesia with the patient or authorized representative who has indicated his/her understanding and acceptance.   Dental advisory given  Plan Discussed with: CRNA  Anesthesia Plan Comments:         Anesthesia Quick Evaluation

## 2015-01-09 NOTE — ED Notes (Signed)
Pt going to OR for surgery per Dr Roderic Palau.

## 2015-01-09 NOTE — H&P (Signed)
[name] is an 59 y.o. female.    Chief Complaint: Abdominal pain  HPI: Patient is a 59 year old female with a history of rectal cancer, status post low anterior resection of the rectum in 2010 followed by radiation and chemotherapy. She subsequently had a loop colostomy in the left abdomen at [facility] due to incontinence. Following this she developed a large parastomal hernia and had a laparoscopic repair with conversion of her loop colostomy to a Hartmann colostomy and repair of her peristomal hernia with a piece of biologic material with a Sugarbaker technique. This was in January 2015. She has had a recurrence of her hernia in recent months. She underwent routine screening colonoscopy today and immediately after the procedure complained of severe pain in her abdomen which has persisted. She describes severe sharp and aching pain radiating from her epigastrium down to her colostomy on the left side and her entire left abdomen. It hurts to move or take a deep breath. She was not having any significant abdominal complaints prior to the colonoscopy other than some occasional discomfort around her stoma. She has had nausea without vomiting. She was transferred to the emergency department for further evaluation.  Past Medical History  Diagnosis Date  . 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 Hypertension   . Diverticulosis   . Radiation proctitis     mild  . Adenomatous colon polyp   . Anemia, unspecified   . Depression   . Nonspecific colitis 10/10/11  . Cocaine abuse     as recently as 10/21/11  . Hernia 03/18/2013  . Incontinence of bowel     since colostomy  . History of transfusion   . Difficulty sleeping     takes meds to sleep  . Knee fracture, left   . 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 Mouth surgery  10/2009  . Low anterior bowel resection  03/07/09  . Colostomy  2013    [facility]   . Portacath placement      and removal  . Laparoscopic parastomal hernia N/A 05/12/2013    Procedure: LAPAROSCOPIC PARASTOMAL HERNIA;  Surgeon: Leighton Ruff, MD;  Location: [facility];  Service: General;  Laterality: N/A;  . Hernia repair    . Finger fracture surgery Left     and hand surgery    Family History  Problem Relation Age of Onset  . Colon cancer Mother   . Stomach cancer Sister   . Colon polyps Sister   . Diabetes Father   . Cirrhosis Brother   . Pulmonary Hypertension Brother    Social History:  reports that she has been smoking Cigarettes.  She has a 10 pack-year smoking history. She has never used smokeless tobacco. She reports that she drinks alcohol. She reports that she uses illicit drugs (Marijuana and Cocaine).  Allergies:  Allergies  Allergen Reactions  . Ampicillin Hives  . Hydrocodone Itching  . Penicillins Other (See Comments)    Unknown reaction  . Grapeseed Extract [Nutritional Supplements] Rash     (Not in a hospital admission)  Results for orders placed or performed during the hospital encounter of 01/09/15 (from the past 48 hour(s))  CBC with Differential/Platelet     Status: Abnormal   Collection Time: 01/09/15  6:22 PM  Result Value Ref Range   WBC 9.0 4.0 - 10.5 K/uL   RBC 4.85 3.87 -  5.11 MIL/uL   Hemoglobin 15.2 (H) 12.0 - 15.0 g/dL   HCT 45.2 36.0 - 46.0 %   MCV 93.2 78.0 - 100.0 fL   MCH 31.3 26.0 - 34.0 pg   MCHC 33.6 30.0 - 36.0 g/dL   RDW 14.0 11.5 - 15.5 %   Platelets 335 150 - 400 K/uL   Neutrophils Relative % 76 %   Neutro Abs 6.8 1.7 - 7.7 K/uL   Lymphocytes Relative 20 %   Lymphs Abs 1.8 0.7 - 4.0 K/uL   Monocytes Relative 4 %   Monocytes Absolute 0.4 0.1 - 1.0 K/uL   Eosinophils Relative 0 %   Eosinophils Absolute 0.0 0.0 - 0.7 K/uL   Basophils Relative 0 %   Basophils Absolute 0.0 0.0 - 0.1 K/uL  Comprehensive metabolic panel     Status: Abnormal   Collection Time: 01/09/15  6:22 PM  Result Value Ref Range   Sodium  136 135 - 145 mmol/L   Potassium 2.8 (L) 3.5 - 5.1 mmol/L   Chloride 104 101 - 111 mmol/L   CO2 22 22 - 32 mmol/L   Glucose, Bld 178 (H) 65 - 99 mg/dL   BUN 7 6 - 20 mg/dL   Creatinine, Ser 0.76 0.44 - 1.00 mg/dL   Calcium 8.9 8.9 - 10.3 mg/dL   Total Protein 7.8 6.5 - 8.1 g/dL   Albumin 4.3 3.5 - 5.0 g/dL   AST 28 15 - 41 U/L   ALT 21 14 - 54 U/L   Alkaline Phosphatase 90 38 - 126 U/L   Total Bilirubin 0.9 0.3 - 1.2 mg/dL   GFR calc non Af Amer >60 >60 mL/min   GFR calc Af Amer >60 >60 mL/min    Comment: (NOTE) The eGFR has been calculated using the CKD EPI equation. This calculation has not been validated in all clinical situations. eGFR's persistently <60 mL/min signify possible Chronic Kidney Disease.    Anion gap 10 5 - 15   Dg Abd Portable 1v  01/09/2015   CLINICAL DATA:  Nasogastric tube confirmation  EXAM: PORTABLE ABDOMEN - 1 VIEW  COMPARISON:  None.  FINDINGS: The bowel gas pattern is normal. Bowel markers are identified. Nasogastric tube is identified in the mid stomach. There is free air in the retroperitoneum this also free air beneath the right hemidiaphragm.  IMPRESSION: Nasogastric tube distal tip in the mid stomach.  Retroperitoneal air and free air in the abdomen. Clinical correlation is recommended. Recommend further evaluation with CT abdomen and pelvis.   Electronically Signed   By: Abelardo Diesel M.D.   On: 01/09/2015 19:25    Review of Systems  Constitutional: Negative for fever, chills and weight loss.  Respiratory: Negative.   Cardiovascular: Negative.   Gastrointestinal: Positive for nausea and abdominal pain. Negative for vomiting and blood in stool.  Neurological: Positive for sensory change. Negative for seizures and loss of consciousness.  Psychiatric/Behavioral: Positive for depression.    Blood pressure 160/85, pulse 65, temperature 97.6 F (36.4 C), temperature source Oral, resp. rate 20, SpO2 91 %. Physical Exam  General: Alert, Mildly obese  African-American female in significant pain. Skin: Warm and dry without rash or infection. HEENT: No palpable masses or thyromegaly. Sclera nonicteric. Pupils equal round and reactive.  Lymph nodes: No cervical, supraclavicular, or inguinal nodes palpable. Lungs: Breath sounds clear and equal without increased work of breathing Cardiovascular: Regular rate and rhythm without murmur. No JVD or edema. Peripheral pulses intact. Abdomen: Mild distention. Stoma  left mid abdomen with parastomal hernia. Old midline and laparoscopic incisions. Old stomal incision several centimeters above her current stoma. There is marked diffuse abdominal tenderness with guarding and peritoneal signs. No detectable masses or organomegaly. Extremities: No edema or joint swelling or deformity. No chronic venous stasis changes. Peripheral pulses intact Neurologic: Alert and fully oriented. No gross motor deficits  Assessment/Plan Severe abdominal pain and tenderness immediately post colonoscopy and KUB shows free air. She obviously has a perforation. I have recommended proceeding with exploratory laparotomy. She is receiving broad-spectrum IV antibiotics. I discussed the procedure with the patient and her family. I discussed we will have to localize the perforation and then decide exactly How to deal with this, i.e. repair or resection and possibly new colostomy. The nature of the surgery was discussed in detail and they are in agreement.  Minie Roadcap T 01/09/2015, 8:19 PM

## 2015-01-09 NOTE — Progress Notes (Signed)
The patient underwent endoscopy through colostomy this afternoon. Small polyp removed at the AO. The left colon was examined through intubation through the rectum and had diversion colitis but the exam was not completed in the left colon due to poor prep. She developed abdominal pain during recovering and had progressive distension of her stoma at site of known parastomal hernia. She was not able to pass air and began vomiting with worsening pain. Examination of the stoma with finger was not able to relieve air. I placed a rectal tube into the stoma but could not decompress the bowel. I called the general surgeons on call at [facility] to notify them about her care and spoke with the ED physician. Recommending transfer to the ED via EMS for imaging and further care. She will receive CT imaging and surgical consult if symptoms do not abate. Patient agreed with the plan.

## 2015-01-09 NOTE — ED Notes (Signed)
Contrast given via NG tube.

## 2015-01-09 NOTE — Anesthesia Procedure Notes (Signed)
Procedure Name: Intubation Date/Time: 01/09/2015 9:03 PM Performed by: Lajuana Carry E Pre-anesthesia Checklist: Patient identified, Emergency Drugs available, Suction available and Patient being monitored Patient Re-evaluated:Patient Re-evaluated prior to inductionOxygen Delivery Method: Circle System Utilized Preoxygenation: Pre-oxygenation with 100% oxygen Intubation Type: IV induction, Rapid sequence and Cricoid Pressure applied Laryngoscope Size: Miller and 2 Grade View: Grade I Tube type: Oral Tube size: 7.0 mm Number of attempts: 1 Airway Equipment and Method: Stylet Placement Confirmation: ETT inserted through vocal cords under direct vision,  positive ETCO2 and breath sounds checked- equal and bilateral Secured at: 18 cm Tube secured with: Tape Dental Injury: Teeth and Oropharynx as per pre-operative assessment

## 2015-01-09 NOTE — ED Notes (Signed)
Bed: PO14 Expected date:  Expected time:  Means of arrival:  Comments: EMS/?hernia obs/post/colonoscopy

## 2015-01-09 NOTE — ED Notes (Signed)
Per EMS - patient had a colonoscopy via her stoma at [place] earlier today.  Patient began having abdominal pain after the procedure and her abdomen became distended.  Patient sent to ED for evaluation.  Surgery has been notified.

## 2015-01-10 ENCOUNTER — Telehealth: Payer: Self-pay

## 2015-01-10 ENCOUNTER — Encounter (HOSPITAL_COMMUNITY): Payer: Self-pay | Admitting: General Surgery

## 2015-01-10 ENCOUNTER — Telehealth: Payer: Self-pay | Admitting: *Deleted

## 2015-01-10 DIAGNOSIS — K631 Perforation of intestine (nontraumatic): Secondary | ICD-10-CM | POA: Diagnosis present

## 2015-01-10 DIAGNOSIS — D72819 Decreased white blood cell count, unspecified: Secondary | ICD-10-CM | POA: Diagnosis not present

## 2015-01-10 DIAGNOSIS — T451X5A Adverse effect of antineoplastic and immunosuppressive drugs, initial encounter: Secondary | ICD-10-CM | POA: Diagnosis present

## 2015-01-10 DIAGNOSIS — Z8249 Family history of ischemic heart disease and other diseases of the circulatory system: Secondary | ICD-10-CM | POA: Diagnosis not present

## 2015-01-10 DIAGNOSIS — K627 Radiation proctitis: Secondary | ICD-10-CM | POA: Diagnosis present

## 2015-01-10 DIAGNOSIS — Z885 Allergy status to narcotic agent status: Secondary | ICD-10-CM | POA: Diagnosis not present

## 2015-01-10 DIAGNOSIS — Z85048 Personal history of other malignant neoplasm of rectum, rectosigmoid junction, and anus: Secondary | ICD-10-CM | POA: Diagnosis not present

## 2015-01-10 DIAGNOSIS — K435 Parastomal hernia without obstruction or  gangrene: Secondary | ICD-10-CM | POA: Diagnosis present

## 2015-01-10 DIAGNOSIS — Z86010 Personal history of colonic polyps: Secondary | ICD-10-CM | POA: Diagnosis not present

## 2015-01-10 DIAGNOSIS — F1420 Cocaine dependence, uncomplicated: Secondary | ICD-10-CM | POA: Diagnosis present

## 2015-01-10 DIAGNOSIS — K529 Noninfective gastroenteritis and colitis, unspecified: Secondary | ICD-10-CM | POA: Diagnosis present

## 2015-01-10 DIAGNOSIS — Z79899 Other long term (current) drug therapy: Secondary | ICD-10-CM | POA: Diagnosis not present

## 2015-01-10 DIAGNOSIS — Z923 Personal history of irradiation: Secondary | ICD-10-CM | POA: Diagnosis not present

## 2015-01-10 DIAGNOSIS — R159 Full incontinence of feces: Secondary | ICD-10-CM | POA: Diagnosis present

## 2015-01-10 DIAGNOSIS — Z880 Allergy status to penicillin: Secondary | ICD-10-CM | POA: Diagnosis not present

## 2015-01-10 DIAGNOSIS — K660 Peritoneal adhesions (postprocedural) (postinfection): Secondary | ICD-10-CM | POA: Diagnosis present

## 2015-01-10 DIAGNOSIS — K635 Polyp of colon: Secondary | ICD-10-CM | POA: Diagnosis not present

## 2015-01-10 DIAGNOSIS — Y838 Other surgical procedures as the cause of abnormal reaction of the patient, or of later complication, without mention of misadventure at the time of the procedure: Secondary | ICD-10-CM | POA: Diagnosis not present

## 2015-01-10 DIAGNOSIS — R109 Unspecified abdominal pain: Secondary | ICD-10-CM | POA: Diagnosis present

## 2015-01-10 DIAGNOSIS — Z800 Family history of malignant neoplasm of digestive organs: Secondary | ICD-10-CM | POA: Diagnosis not present

## 2015-01-10 DIAGNOSIS — K659 Peritonitis, unspecified: Secondary | ICD-10-CM | POA: Diagnosis present

## 2015-01-10 DIAGNOSIS — F17210 Nicotine dependence, cigarettes, uncomplicated: Secondary | ICD-10-CM | POA: Diagnosis present

## 2015-01-10 DIAGNOSIS — T8132XA Disruption of internal operation (surgical) wound, not elsewhere classified, initial encounter: Secondary | ICD-10-CM | POA: Diagnosis not present

## 2015-01-10 DIAGNOSIS — Y842 Radiological procedure and radiotherapy as the cause of abnormal reaction of the patient, or of later complication, without mention of misadventure at the time of the procedure: Secondary | ICD-10-CM | POA: Diagnosis present

## 2015-01-10 DIAGNOSIS — I10 Essential (primary) hypertension: Secondary | ICD-10-CM | POA: Diagnosis present

## 2015-01-10 DIAGNOSIS — Z933 Colostomy status: Secondary | ICD-10-CM | POA: Diagnosis not present

## 2015-01-10 DIAGNOSIS — G620 Drug-induced polyneuropathy: Secondary | ICD-10-CM | POA: Diagnosis present

## 2015-01-10 DIAGNOSIS — E876 Hypokalemia: Secondary | ICD-10-CM | POA: Diagnosis not present

## 2015-01-10 DIAGNOSIS — Z79891 Long term (current) use of opiate analgesic: Secondary | ICD-10-CM | POA: Diagnosis not present

## 2015-01-10 DIAGNOSIS — Z833 Family history of diabetes mellitus: Secondary | ICD-10-CM | POA: Diagnosis not present

## 2015-01-10 LAB — CBC
HCT: 35.6 % — ABNORMAL LOW (ref 36.0–46.0)
Hemoglobin: 11.7 g/dL — ABNORMAL LOW (ref 12.0–15.0)
MCH: 30.6 pg (ref 26.0–34.0)
MCHC: 32.9 g/dL (ref 30.0–36.0)
MCV: 93.2 fL (ref 78.0–100.0)
Platelets: 265 10*3/uL (ref 150–400)
RBC: 3.82 MIL/uL — ABNORMAL LOW (ref 3.87–5.11)
RDW: 14.2 % (ref 11.5–15.5)
WBC: 7.4 10*3/uL (ref 4.0–10.5)

## 2015-01-10 LAB — BASIC METABOLIC PANEL
Anion gap: 10 (ref 5–15)
BUN: 11 mg/dL (ref 6–20)
CO2: 19 mmol/L — ABNORMAL LOW (ref 22–32)
Calcium: 7.6 mg/dL — ABNORMAL LOW (ref 8.9–10.3)
Chloride: 105 mmol/L (ref 101–111)
Creatinine, Ser: 0.97 mg/dL (ref 0.44–1.00)
GFR calc Af Amer: >60 mL/min (ref >60)
GFR calc non Af Amer: >60 mL/min (ref >60)
Glucose, Bld: 166 mg/dL — ABNORMAL HIGH (ref 65–99)
Potassium: 3 mmol/L — ABNORMAL LOW (ref 3.5–5.1)
Sodium: 134 mmol/L — ABNORMAL LOW (ref 135–145)

## 2015-01-10 LAB — MAGNESIUM: Magnesium: 0.9 mg/dL — CL (ref 1.7–2.4)

## 2015-01-10 MED ORDER — LABETALOL HCL 5 MG/ML IV SOLN
INTRAVENOUS | Status: AC
  Filled 2015-01-10: qty 4

## 2015-01-10 MED ORDER — PROMETHAZINE HCL 25 MG/ML IJ SOLN: 6.2500 mg | INTRAMUSCULAR | Status: DC | PRN

## 2015-01-10 MED ORDER — KCL IN DEXTROSE-NACL 20-5-0.45 MEQ/L-%-% IV SOLN
INTRAVENOUS | Status: DC
  Administered 2015-01-10: 05:00:00 via INTRAVENOUS
  Filled 2015-01-10 (×2): qty 1000

## 2015-01-10 MED ORDER — FENTANYL CITRATE (PF) 100 MCG/2ML IJ SOLN
INTRAMUSCULAR | Status: AC
  Filled 2015-01-10: qty 2

## 2015-01-10 MED ORDER — ONDANSETRON HCL 4 MG/2ML IJ SOLN
4.0000 mg | Freq: Four times a day (QID) | INTRAMUSCULAR | Status: DC | PRN
  Administered 2015-01-12 – 2015-01-13 (×3): 4 mg via INTRAVENOUS
  Filled 2015-01-10: qty 2

## 2015-01-10 MED ORDER — PANTOPRAZOLE SODIUM 40 MG IV SOLR
40.0000 mg | Freq: Every day | INTRAVENOUS | Status: DC
  Administered 2015-01-10 – 2015-01-18 (×9): 40 mg via INTRAVENOUS
  Filled 2015-01-10 (×12): qty 40

## 2015-01-10 MED ORDER — SODIUM CHLORIDE 0.9 % IJ SOLN: 9.0000 mL | INTRAMUSCULAR | Status: DC | PRN

## 2015-01-10 MED ORDER — KCL IN DEXTROSE-NACL 40-5-0.9 MEQ/L-%-% IV SOLN
INTRAVENOUS | Status: DC
  Administered 2015-01-10 – 2015-01-11 (×2): via INTRAVENOUS
  Filled 2015-01-10 (×4): qty 1000

## 2015-01-10 MED ORDER — LABETALOL HCL 5 MG/ML IV SOLN
5.0000 mg | INTRAVENOUS | Status: DC | PRN
  Administered 2015-01-10: 5 mg via INTRAVENOUS

## 2015-01-10 MED ORDER — MAGNESIUM SULFATE 50 % IJ SOLN
3.0000 g | Freq: Once | INTRAVENOUS | Status: AC
  Administered 2015-01-10: 3 g via INTRAVENOUS
  Filled 2015-01-10: qty 6

## 2015-01-10 MED ORDER — ONDANSETRON 4 MG PO TBDP: 4.0000 mg | ORAL_TABLET | Freq: Four times a day (QID) | ORAL | Status: DC | PRN

## 2015-01-10 MED ORDER — LORAZEPAM 2 MG/ML IJ SOLN: 0.5000 mg | Freq: Four times a day (QID) | INTRAMUSCULAR | Status: DC | PRN

## 2015-01-10 MED ORDER — MEPERIDINE HCL 50 MG/ML IJ SOLN: 6.2500 mg | INTRAMUSCULAR | Status: DC | PRN

## 2015-01-10 MED ORDER — ENALAPRILAT 1.25 MG/ML IV SOLN
0.6250 mg | Freq: Four times a day (QID) | INTRAVENOUS | Status: DC | PRN
  Filled 2015-01-10: qty 0.5

## 2015-01-10 MED ORDER — HYDROMORPHONE 0.3 MG/ML IV SOLN
INTRAVENOUS | Status: DC
  Administered 2015-01-10: 10:00:00 via INTRAVENOUS
  Administered 2015-01-10: 1.99 mg via INTRAVENOUS
  Administered 2015-01-10: 21:00:00 via INTRAVENOUS
  Administered 2015-01-10: 2.49 mg via INTRAVENOUS
  Administered 2015-01-11: 10:00:00 via INTRAVENOUS
  Administered 2015-01-11: 4.49 mg via INTRAVENOUS
  Administered 2015-01-12: 1.99 mg via INTRAVENOUS
  Administered 2015-01-12: 2.99 mg via INTRAVENOUS
  Administered 2015-01-12: 2.49 mg via INTRAVENOUS
  Administered 2015-01-12: 1.49 mg via INTRAVENOUS
  Administered 2015-01-12: 2.99 mg via INTRAVENOUS
  Administered 2015-01-12: 13:00:00 via INTRAVENOUS
  Administered 2015-01-12: 1.99 mg via INTRAVENOUS
  Administered 2015-01-12 – 2015-01-13 (×2): 1.5 mg via INTRAVENOUS
  Administered 2015-01-13 (×3): 1.99 mg via INTRAVENOUS
  Administered 2015-01-13: 14:00:00 via INTRAVENOUS
  Administered 2015-01-13: 1 mg via INTRAVENOUS
  Administered 2015-01-14: 08:00:00 via INTRAVENOUS
  Administered 2015-01-14: 0.3 mg via INTRAVENOUS
  Administered 2015-01-14: 2.7 mg via INTRAVENOUS
  Administered 2015-01-14: 1.2 mg via INTRAVENOUS
  Administered 2015-01-14: 2.1 mg via INTRAVENOUS
  Administered 2015-01-14: 1.5 mg via INTRAVENOUS
  Administered 2015-01-14: 4.2 mg via INTRAVENOUS
  Administered 2015-01-14: 1.8 mg via INTRAVENOUS
  Administered 2015-01-15: 2.4 mg via INTRAVENOUS
  Administered 2015-01-15: 16:00:00 via INTRAVENOUS
  Administered 2015-01-15: 1.8 mg via INTRAVENOUS
  Administered 2015-01-15: 3.6 mg via INTRAVENOUS
  Administered 2015-01-15: 0.9 mg via INTRAVENOUS
  Administered 2015-01-15: 2.7 mg via INTRAVENOUS
  Administered 2015-01-15: 3.3 mg via INTRAVENOUS
  Administered 2015-01-15: 2.25 mg via INTRAVENOUS
  Administered 2015-01-15: 1.2 mg via INTRAVENOUS
  Administered 2015-01-16: 1.5 mg via INTRAVENOUS
  Administered 2015-01-16: 3.3 mg via INTRAVENOUS
  Administered 2015-01-16: 13:00:00 via INTRAVENOUS
  Administered 2015-01-16: 6.3 mg via INTRAVENOUS
  Administered 2015-01-16: 1.2 mg via INTRAVENOUS
  Administered 2015-01-16: 3 mg via INTRAVENOUS
  Administered 2015-01-16: 06:00:00 via INTRAVENOUS
  Administered 2015-01-17: 1.24 mg via INTRAVENOUS
  Administered 2015-01-17: 01:00:00 via INTRAVENOUS
  Administered 2015-01-17: 3 mg via INTRAVENOUS
  Filled 2015-01-10 (×14): qty 25

## 2015-01-10 MED ORDER — ACETAMINOPHEN 10 MG/ML IV SOLN
1000.0000 mg | Freq: Four times a day (QID) | INTRAVENOUS | Status: AC
  Administered 2015-01-10 – 2015-01-11 (×4): 1000 mg via INTRAVENOUS
  Filled 2015-01-10 (×4): qty 100

## 2015-01-10 MED ORDER — ENOXAPARIN SODIUM 40 MG/0.4ML ~~LOC~~ SOLN
40.0000 mg | SUBCUTANEOUS | Status: DC
  Administered 2015-01-11 – 2015-01-22 (×12): 40 mg via SUBCUTANEOUS
  Filled 2015-01-10 (×15): qty 0.4

## 2015-01-10 MED ORDER — DIPHENHYDRAMINE HCL 50 MG/ML IJ SOLN
12.5000 mg | Freq: Four times a day (QID) | INTRAMUSCULAR | Status: DC | PRN
  Administered 2015-01-10 – 2015-01-14 (×5): 12.5 mg via INTRAVENOUS
  Filled 2015-01-10 (×5): qty 1

## 2015-01-10 MED ORDER — LACTATED RINGERS IV SOLN: INTRAVENOUS | Status: DC

## 2015-01-10 MED ORDER — NALOXONE HCL 0.4 MG/ML IJ SOLN: 0.4000 mg | INTRAMUSCULAR | Status: DC | PRN

## 2015-01-10 MED ORDER — DIPHENHYDRAMINE HCL 12.5 MG/5ML PO ELIX
12.5000 mg | ORAL_SOLUTION | Freq: Four times a day (QID) | ORAL | Status: DC | PRN
Start: 2015-01-10 — End: 2015-01-17
  Administered 2015-01-12: 12.5 mg via ORAL
  Filled 2015-01-10: qty 5

## 2015-01-10 MED ORDER — ONDANSETRON HCL 4 MG/2ML IJ SOLN
4.0000 mg | Freq: Four times a day (QID) | INTRAMUSCULAR | Status: DC | PRN
  Filled 2015-01-10 (×2): qty 2

## 2015-01-10 MED ORDER — LACTATED RINGERS IV BOLUS (SEPSIS)
500.0000 mL | Freq: Once | INTRAVENOUS | Status: AC
  Administered 2015-01-10: 500 mL via INTRAVENOUS

## 2015-01-10 MED ORDER — FENTANYL CITRATE (PF) 100 MCG/2ML IJ SOLN
25.0000 ug | INTRAMUSCULAR | Status: DC | PRN
  Administered 2015-01-10 (×2): 50 ug via INTRAVENOUS

## 2015-01-10 MED ORDER — HYDROMORPHONE HCL 1 MG/ML IJ SOLN
1.0000 mg | INTRAMUSCULAR | Status: DC | PRN
  Administered 2015-01-10 (×2): 1 mg via INTRAVENOUS
  Filled 2015-01-10 (×2): qty 1

## 2015-01-10 NOTE — Telephone Encounter (Signed)
Pt was admitted into [facility] 01-09-15.  See hospital records.  No follow up call made. maw

## 2015-01-10 NOTE — Consult Note (Signed)
WOC ostomy consult note Stoma type/location: RLQ Colostomy Stomal assessment/size: Oval, 1 and 3/4 inches x 2 inches, red, most, edematous Peristomal assessment: intact Treatment options for stomal/peristomal skin: skin barrier ring Output serosanguinous, scant amount Ostomy pouching: 2pc. 2 and 3/4 inch pouching system with skin barrier ring.  Supplies at bedside. Education provided: Patient is conversive, but groggy. Has been independent in her previous ostomy care and has had her sister as back-up in the past.  Will need assistance measuring this stoma and adjusting until she has her pattern established. [place] nursing team will follow, and will remain available to this patient, the nursing, surgical and medical teams.   Thanks, Maudie Flakes, MSN, RN, [place], [place], [place] [phone])

## 2015-01-10 NOTE — Op Note (Signed)
Preoperative Diagnosis: Perforated colon  Postoprative Diagnosis: Same, likely rectosigmoid  Procedure: Procedure(s): Exploratory laparotomy, resection and revision of colostomy, appendectomy, omentectomy, repair incisional hernia and placement of pelvic drains    Surgeon: Excell Seltzer T   Assistants: Rolm Bookbinder  Anesthesia:  General endotracheal anesthesia  Indications: Patient is a 59 year old female with a complex previous history of rectosigmoid cancer treated with low anterior resection, radiation and chemotherapy with severe incontinence initially treated with Loop colostomy several years ago complicated by a large parastomal hernia and converted laparoscopically to a Hartmann type colectomy and repair of stomal hernia with biologic mesh in 2015. Today she underwent colonoscopy per rectum and stoma and developed severe generalized abdominal pain postoperatively. Complaining particularly about pain around her stoma. Plain abdominal x-rays in the emergency department showed free air as well as retroperitoneal air and her abdomen was diffusely tender with evidence of peritonitis. With these findings I recommended proceeding with exploratory laparotomy for perforated viscus. The various findings of possible treatments were discussed with the patient and her sister preoperatively and they understand and agree to proceed.    Procedure Detail:  Patient was taken operating room, placed in the supine position on the operating table, and general endotracheal anesthesia induced. She received broad-spectrum preoperative IV antibiotics. PAS were in place. The abdomen was widely prepped with Betadine and Ioban drape. Patient timeout was performed and correct procedure verified. The previous midline incision was used and dissection carried down to subcutaneous tissue midline fascia with cautery. The peritoneum was entered under direct vision. There were fairly extensive adhesions to the anterior  abdominal wall from the previously placed large piece of biologic mesh from her laparoscopic procedure. The adhesions were mostly to the omentum and were not extremely dense. Extensive adhesio lysis was performed to completely free the anterior abdominal wall. The transverse colon was identified. There was moderate amount of cloudy slightly foul-smelling fluid in the abdomen consistent with a perforation. Initially I examined the entire small bowel and the right and transverse colon and did not see any obvious perforation. The pelvis had very dense adhesions from previous low anterior resection and radiation and I really could not clearly identify the rectosigmoid stump. No single area seemed particularly inflamed or injured. In order to more fully examine the colon the entire right colon was mobilized up out of the retroperitoneum dividing lateral peritoneal attachments and the hepatic flexure was mobilized dividing the hepatocolic ligament. The appendix was very long and retrocecal and we went ahead and performed appendectomy dividing the mesial appendix and stapling the Ace the appendix with a TA-30 blue load stapler and the appendix was removed. With the colon fully mobilized we carefully again examined the entire small bowel right and transverse colon and did not see any evidence of injury. We could not examine the colon up in the stoma in the left mid abdomen due to the recurrent hernia. I was concerned the perforation could be right near the stoma and also the patient had a couple of significant polyps at the stoma that it been noted at the colonoscopy that likely needed to be resected so in order to rule out perforation here I elected to take down the colostomy is really the only way to examine it. The previous stoma was excised at the skin level and this dissection taken down along the subcutaneous plane mobilizing the stoma into the hernia sac which was divided and the stoma completely separated from the  abdominal wall. I did not see any  definite evidence of perforation at the stoma. We elected to resect the stoma back to definitely healthy appearing distal transverse colon and this portion of the mesial colon was divided between clamps and tied with 2-0 silk ties and then the colon was divided at a healthy-appearing area with the GIA stapler. At this point by process of exclusion I felt quite likely the perforation was in the rectosigmoid but again because of the severe radiation and inflammatory changes in the pelvis this area really could not be explored without undue potential harm. We elected to re-site of the colostomy due to the large parastomal hernia and irrigate and drain the pelvis for the possibility of a perforation of the rectosigmoid. This area was noted to be severely inflamed due to radiation proctitis and I did not want to do a rigid sigmoidoscopy for fear of causing further injury and again really we could not resect or close anything in this area due to the severe chronic inflammation and felt that drainage was the best approach. Initially the old colostomy site hernia was closed closing the fascia longitudinally with interrupted #1 Novafil pop os. I then used a 12 x 12 cm piece of Flex HD which was sutured to the midline edge of the fascia medial to the old stoma site with several #1 Novafil sutures and then the Flex HD was deployed out laterally broadly covering the stoma closure site and tacked to the peritoneum with the [employer]. A stoma site in the right upper quadrant was chosen and skin and subcutaneous fat excised, the anterior fascia exposed and a cruciate incision made in the rectus muscle bluntly split and the peritoneum opened and dilated to 2 fingers. The transverse colon was brought out as an end stoma without any undue tension and appeared healthy. The abdomen was thoroughly irrigated with multiple liters of warm saline. 219 Blake drains were left in the right lower  quadrant and left lower quadrant and placed deep into the pelvis. The midline fascia was then closed using running looped #1 PDS beginning at either end of the incision with also interrupted #1 Novafil's. The ostomy was matured with interrupted 3-0 Vicryl. The stoma site and midline wound were packed open with moist saline gauze. Sponge and instrument counts were correct.    Findings: As above  Estimated Blood Loss:  less than 100 mL         Drains: 19 Blake drain 2 and pelvis  Blood Given: none          Specimens: #1 colostomy   #2 omentum    #3 appendix        Complications:  * No complications entered in OR log *         Disposition: PACU - hemodynamically stable.         Condition: stable

## 2015-01-10 NOTE — Progress Notes (Signed)
  I visited the patient this morning and reviewed her chart. I performed her surveillance colonoscopy yesterday, evaluating the right colon through the colostomy, and evaluation of the diverted left colon through the rectum. One diminutive polyp was removed in the cecum with cold forceps but otherwise no large polyps removed or other aggressive interventions performed. There was diversion colitis and diverticulosis of the left colon appreciated, but given the poor prep in the left colon it was a limited exam, and the endoscope was withdrawn from the distal sigmoid colon. No polyps were removed from the left colon.  Unfortunately the patient developed severe abdominal pain following the procedure and was transferred to the ED where xray showed free air concerning for perforation. She was subsequently taken to the OR for laporotomy last night. Operative note reviewed. I appreciate the excellent surgical care for this patient. Her pain seems improved this morning and she is in good spirits. I discussed the endoscopy with her, and answered her questions. It is not clear where in her bowel this injury occurred per the operative note, perhaps left colon given it was not able to be completely evaluated due to adhesions.  Please page with any questions / concerns or if there is anything I can help with, again appreciate surgical teams care for this patient.  [place] Cellar, MD [facility] Pager [phone]

## 2015-01-10 NOTE — Progress Notes (Signed)
Dr. Barbarann Ehlers made aware of patient's heart rates and blood pressure-Labetalol given as ordered

## 2015-01-10 NOTE — Progress Notes (Signed)
Talked with CNA who stated that pt has not dangled today due to the increased pain while moving. Pt encouraged to get up and moving around. Pt agrees with this plan. Foley to be discontinued tonight.

## 2015-01-10 NOTE — Progress Notes (Addendum)
Dr. Marcell Barlow made aware of patient's heart rates and blood pressures and urine output in PACU-Orders given- patient to receive 500 cc LR IV Bolus- then may go to floor-Dr. Marcell Barlow also made aware of I.V. Fluid intake in PACU

## 2015-01-10 NOTE — Transfer of Care (Signed)
Immediate Anesthesia Transfer of Care Note  Patient: [name]  Procedure(s) Performed: Procedure(s) with comments: EXPLORATORY LAPAROTOMY (N/A) - RESECTION OF COLOSTOMY, CREATION OF NEW COLOSTOMY, VENTRAL HERNIA REPAIR WITH BIOLOGIC MESH,  LYSIS OF ADHESIONS , OMENTECTOMY APPENDECTOMY (N/A)  Patient Location: PACU  Anesthesia Type:General  Level of Consciousness: awake, alert , oriented and patient cooperative  Airway & Oxygen Therapy: Patient Spontanous Breathing and Patient connected to face mask oxygen  Post-op Assessment: Report given to RN, Post -op Vital signs reviewed and stable and Patient moving all extremities  Post vital signs: Reviewed and stable  Last Vitals:  Filed Vitals:   01/09/15 2021  BP: 168/78  Pulse: 93  Temp:   Resp: 21    Complications: No apparent anesthesia complications

## 2015-01-10 NOTE — Telephone Encounter (Signed)
Patient is admitted to [facility].

## 2015-01-10 NOTE — Progress Notes (Signed)
1 Day Post-Op  Subjective: She is awake and is doing as well as can be expected at this point.  NG working, no BS.  Ostomy bag is empty.  She is alert and in a really good mood.  Objective: Vital signs in last 24 hours: Temp:  [96.5 F (35.8 C)-100.2 F (37.9 C)] 98.9 F (37.2 C) (09/28 0535) Pulse Rate:  [56-131] 104 (09/28 0535) Resp:  [0-150] 17 (09/28 0535) BP: (90-180)/(60-105) 119/66 mmHg (09/28 0535) SpO2:  [81 %-100 %] 99 % (09/28 0535) Weight:  [80.287 kg (177 lb)-87.8 kg (193 lb 9 oz)] 87.8 kg (193 lb 9 oz) (09/28 0500) Last BM Date: 01/09/15 NPO 10 from NG Drains 30 from left; 80 from the right TM 100.2, VSS No labs this AM so far Intake/Output from previous day: 09/27 0701 - 09/28 0700 In: 4008.3 [I.V.:3458.3; NG/GT:50; IV Piggyback:500] Out: 710 [Urine:515; Emesis/NG output:10; Drains:110; Blood:75] Intake/Output this shift:    General appearance: alert, cooperative and no distress Resp: clear to auscultation bilaterally GI: very tender all over, no BS, large dressing in place.  Drains are working the left one draining less, I stripped it. lots of clot in it.  Right is full of serosanguneous fluid.  Lab Results:   Recent Labs  01/09/15 1822  WBC 9.0  HGB 15.2*  HCT 45.2  PLT 335    BMET  Recent Labs  01/09/15 1822  NA 136  K 2.8*  CL 104  CO2 22  GLUCOSE 178*  BUN 7  CREATININE 0.76  CALCIUM 8.9   PT/INR No results for input(s): LABPROT, INR in the last 72 hours.   Recent Labs Lab 01/09/15 1822  AST 28  ALT 21  ALKPHOS 90  BILITOT 0.9  PROT 7.8  ALBUMIN 4.3     Lipase     Component Value Date/Time   LIPASE 33 02/03/2014 7829     Studies/Results: Dg Abd Portable 1v  01/09/2015   CLINICAL DATA:  Nasogastric tube confirmation  EXAM: PORTABLE ABDOMEN - 1 VIEW  COMPARISON:  None.  FINDINGS: The bowel gas pattern is normal. Bowel markers are identified. Nasogastric tube is identified in the mid stomach. There is free air in the  retroperitoneum this also free air beneath the right hemidiaphragm.  IMPRESSION: Nasogastric tube distal tip in the mid stomach.  Retroperitoneal air and free air in the abdomen. Clinical correlation is recommended. Recommend further evaluation with CT abdomen and pelvis.   Electronically Signed   By: Abelardo Diesel M.D.   On: 01/09/2015 19:25    Medications: . ciprofloxacin  400 mg Intravenous Q12H  . [START ON 01/11/2015] enoxaparin (LOVENOX) injection  40 mg Subcutaneous Q24H  . fentaNYL      . labetalol      . metronidazole  500 mg Intravenous Q8H  . pantoprazole (PROTONIX) IV  40 mg Intravenous QHS  . potassium chloride  10 mEq Intravenous Once  . potassium chloride  10 mEq Intravenous Once   . dextrose 5 % and 0.45 % NaCl with KCl 20 mEq/L 125 mL/hr at 01/10/15 0446   Prior to Admission medications   Medication Sig Start Date End Date Taking? Authorizing Provider  ALPRAZolam Duanne Moron) 0.5 MG tablet Take 1 tablet (0.5 mg total) by mouth 2 (two) times daily as needed for anxiety or sleep. 11/13/14  Yes Rowe Clack, MD  amitriptyline (ELAVIL) 25 MG tablet Take 1 tablet (25 mg total) by mouth at bedtime as needed for sleep. 09/05/14  Yes  Rowe Clack, MD  citalopram (CELEXA) 20 MG tablet Take 1 tablet (20 mg total) by mouth every morning. 09/05/14  Yes Rowe Clack, MD  gabapentin (NEURONTIN) 400 MG capsule Take 3 capsules (1,200 mg total) by mouth 3 (three) times daily. 09/05/14  Yes Rowe Clack, MD  hydrochlorothiazide (HYDRODIURIL) 25 MG tablet Take 25 mg by mouth every morning.   Yes Historical Provider, MD  lisinopril (PRINIVIL,ZESTRIL) 20 MG tablet Take 1 tablet (20 mg total) by mouth daily. When BP averages > 140/90 05/23/14  Yes Rowe Clack, MD  naproxen sodium (ANAPROX) 220 MG tablet Take 440 mg by mouth 2 (two) times daily as needed (pain).   Yes Historical Provider, MD  oxyCODONE-acetaminophen (PERCOCET/ROXICET) 5-325 MG per tablet Take 1 tablet by mouth  every 6 (six) hours as needed for severe pain. 12/07/14  Yes Domenic Moras, PA-C     Assessment/Plan Perforated rectosigmoid colon, s/p colonoscopy S/p Exploratory laparotomy, resection and revision of colostomy, appendectomy, omentectomy, repair incisional hernia and placement of pelvic drains, 01/10/15, Dr. 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 Recheck labs this AM and replace K+ if needed.  K+ 3.0/Mag 0.9  Will replace    LOS: 0 days    [name],[name] 01/10/2015

## 2015-01-11 LAB — CBC
HCT: 31.8 % — ABNORMAL LOW (ref 36.0–46.0)
Hemoglobin: 10.5 g/dL — ABNORMAL LOW (ref 12.0–15.0)
MCH: 30.8 pg (ref 26.0–34.0)
MCHC: 33 g/dL (ref 30.0–36.0)
MCV: 93.3 fL (ref 78.0–100.0)
Platelets: 207 10*3/uL (ref 150–400)
RBC: 3.41 MIL/uL — ABNORMAL LOW (ref 3.87–5.11)
RDW: 14.4 % (ref 11.5–15.5)
WBC: 6.4 10*3/uL (ref 4.0–10.5)

## 2015-01-11 LAB — BASIC METABOLIC PANEL
Anion gap: 8 (ref 5–15)
BUN: 10 mg/dL (ref 6–20)
CO2: 25 mmol/L (ref 22–32)
Calcium: 8.1 mg/dL — ABNORMAL LOW (ref 8.9–10.3)
Chloride: 103 mmol/L (ref 101–111)
Creatinine, Ser: 0.78 mg/dL (ref 0.44–1.00)
GFR calc Af Amer: >60 mL/min (ref >60)
GFR calc non Af Amer: >60 mL/min (ref >60)
Glucose, Bld: 144 mg/dL — ABNORMAL HIGH (ref 65–99)
Potassium: 3.2 mmol/L — ABNORMAL LOW (ref 3.5–5.1)
Sodium: 136 mmol/L (ref 135–145)

## 2015-01-11 LAB — MAGNESIUM: Magnesium: 1.8 mg/dL (ref 1.7–2.4)

## 2015-01-11 MED ORDER — ACETAMINOPHEN 10 MG/ML IV SOLN
1000.0000 mg | Freq: Four times a day (QID) | INTRAVENOUS | Status: AC
  Administered 2015-01-11 – 2015-01-12 (×4): 1000 mg via INTRAVENOUS
  Filled 2015-01-11 (×4): qty 100

## 2015-01-11 MED ORDER — KCL IN DEXTROSE-NACL 30-5-0.45 MEQ/L-%-% IV SOLN
INTRAVENOUS | Status: DC
  Administered 2015-01-11: 17:00:00 via INTRAVENOUS
  Administered 2015-01-12: 75 mL/h via INTRAVENOUS
  Administered 2015-01-13 – 2015-01-14 (×3): via INTRAVENOUS
  Administered 2015-01-16: 75 mL/h via INTRAVENOUS
  Filled 2015-01-11 (×10): qty 1000

## 2015-01-11 NOTE — Progress Notes (Signed)
2 Days Post-Op  Subjective: She is tolerating things fairly well, she got out of bed twice yesterday, but it was a major effort.  NG cannister shows more than what is recorded, Nothing in the ostomy so far.    Objective: Vital signs in last 24 hours: Temp:  [98.3 F (36.8 C)-99.7 F (37.6 C)] 98.3 F (36.8 C) (09/29 0439) Pulse Rate:  [95-105] 97 (09/29 0439) Resp:  [14-20] 18 (09/29 0439) BP: (95-135)/(54-81) 121/67 mmHg (09/29 0439) SpO2:  [96 %-100 %] 100 % (09/29 0439) Last BM Date: 01/09/15 PO 120 (ice) 925 urine NG 10 ml recorded? Drain 135 Stool none Tm 99.3 Afebrile, VSS K+ up to 3.2, mag up to 1.8 H/H dow some Intake/Output from previous day: 09/28 0701 - 09/29 0700 In: 920 [P.O.:120; I.V.:400; IV Piggyback:400] Out: 1070 [Urine:925; Emesis/NG output:10; Drains:135] Intake/Output this shift:    General appearance: alert, cooperative, no distress and she had allot of discomfort with the dressing removal.   Resp: clear to auscultation bilaterally and encouraged IS GI: Very tender, open sites, midline and the old ostomy site look good.  Nothing from the new ostomy, no BS.  Drains have just serous fluid in them.  Lab Results:   Recent Labs  01/10/15 1010 01/11/15 0525  WBC 7.4 6.4  HGB 11.7* 10.5*  HCT 35.6* 31.8*  PLT 265 207    BMET  Recent Labs  01/10/15 1010 01/11/15 0525  NA 134* 136  K 3.0* 3.2*  CL 105 103  CO2 19* 25  GLUCOSE 166* 144*  BUN 11 10  CREATININE 0.97 0.78  CALCIUM 7.6* 8.1*   PT/INR No results for input(s): LABPROT, INR in the last 72 hours.   Recent Labs Lab 01/09/15 1822  AST 28  ALT 21  ALKPHOS 90  BILITOT 0.9  PROT 7.8  ALBUMIN 4.3     Lipase     Component Value Date/Time   LIPASE 33 02/03/2014 3299     Studies/Results: Dg Abd Portable 1v  01/09/2015   CLINICAL DATA:  Nasogastric tube confirmation  EXAM: PORTABLE ABDOMEN - 1 VIEW  COMPARISON:  None.  FINDINGS: The bowel gas pattern is normal. Bowel  markers are identified. Nasogastric tube is identified in the mid stomach. There is free air in the retroperitoneum this also free air beneath the right hemidiaphragm.  IMPRESSION: Nasogastric tube distal tip in the mid stomach.  Retroperitoneal air and free air in the abdomen. Clinical correlation is recommended. Recommend further evaluation with CT abdomen and pelvis.   Electronically Signed   By: Abelardo Diesel M.D.   On: 01/09/2015 19:25    Medications: . acetaminophen  1,000 mg Intravenous 4 times per day  . ciprofloxacin  400 mg Intravenous Q12H  . enoxaparin (LOVENOX) injection  40 mg Subcutaneous Q24H  . HYDROmorphone PCA 0.3 mg/mL   Intravenous 6 times per day  . metronidazole  500 mg Intravenous Q8H  . pantoprazole (PROTONIX) IV  40 mg Intravenous QHS  . potassium chloride  10 mEq Intravenous Once  . potassium chloride  10 mEq Intravenous Once   . dextrose 5 % and 0.9 % NaCl with KCl 40 mEq/L 100 mL/hr at 01/11/15 0416   Assessment/Plan Perforated rectosigmoid colon, s/p colonoscopy S/p Exploratory laparotomy, resection and revision of colostomy, appendectomy, omentectomy, repair incisional hernia and placement of pelvic drains, 01/10/15, Dr. 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  That will put her on the MWF schedule, rather than TTS.  Encourage IS, leave NG for now.  Get PT to see and assist with ambulation.  Continue IV tylenol/PCA for pain.  Continue antibiotics.  IV has 40 Meq of KCL per liter, and I will continue that to replace K+.  Foley came out yesterday.    LOS: 1 day    [name],[name] 01/11/2015

## 2015-01-11 NOTE — Anesthesia Postprocedure Evaluation (Signed)
  Anesthesia Post-op Note  Patient: [name]  Procedure(s) Performed: Procedure(s) (LRB): EXPLORATORY LAPAROTOMY (N/A) APPENDECTOMY (N/A)  Patient Location: PACU  Anesthesia Type: General  Level of Consciousness: awake and alert   Airway and Oxygen Therapy: Patient Spontanous Breathing  Post-op Pain: mild  Post-op Assessment: Post-op Vital signs reviewed, Patient's Cardiovascular Status Stable, Respiratory Function Stable, Patent Airway and No signs of Nausea or vomiting  Last Vitals:  Filed Vitals:   01/11/15 1840  BP: 101/72  Pulse: 89  Temp: 36.4 C  Resp: 26    Post-op Vital Signs: stable   Complications: No apparent anesthesia complications

## 2015-01-11 NOTE — Care Management Note (Signed)
Case Management Note  Patient Details  Name: AUBRYN SPINOLA MRN: [id number] Date of Birth: 02/23/1956  Subjective/Objective:                   Exploratory laparotomy, resection and revision of colostomy, appendectomy, omentectomy, repair incisional hernia and placement of pelvic drains Action/Plan: Discharge planning  Expected Discharge Date:   (unknown)               Expected Discharge Plan:  [place]  In-House Referral:     Discharge planning Services  CM Consult  Post Acute Care Choice:    Choice offered to:     DME Arranged:    DME Agency:     HH Arranged:  RN [place] Agency:  [place]  Status of Service:  In process, will continue to follow  Medicare Important Message Given:    Date Medicare IM Given:    Medicare IM give by:    Date Additional Medicare IM Given:    Additional Medicare Important Message give by:     If discussed at [place] of Stay Meetings, dates discussed:    Additional Comments: CM me with pt in room to discuss discharge plans and pt states she will be going home after hospitalization.  Pt chooses [facility] as Home health agency.  Referral [facility] rep, Kristen.  CM waiting for progression of pt and orders.   Dellie Catholic, RN 01/11/2015, 4:28 PM

## 2015-01-12 LAB — CBC
HCT: 30.9 % — ABNORMAL LOW (ref 36.0–46.0)
Hemoglobin: 10.1 g/dL — ABNORMAL LOW (ref 12.0–15.0)
MCH: 30.6 pg (ref 26.0–34.0)
MCHC: 32.7 g/dL (ref 30.0–36.0)
MCV: 93.6 fL (ref 78.0–100.0)
Platelets: 199 10*3/uL (ref 150–400)
RBC: 3.3 MIL/uL — ABNORMAL LOW (ref 3.87–5.11)
RDW: 14.5 % (ref 11.5–15.5)
WBC: 1.7 10*3/uL — ABNORMAL LOW (ref 4.0–10.5)

## 2015-01-12 LAB — BASIC METABOLIC PANEL
Anion gap: 7 (ref 5–15)
BUN: 8 mg/dL (ref 6–20)
CO2: 26 mmol/L (ref 22–32)
Calcium: 8.6 mg/dL — ABNORMAL LOW (ref 8.9–10.3)
Chloride: 103 mmol/L (ref 101–111)
Creatinine, Ser: 0.65 mg/dL (ref 0.44–1.00)
GFR calc Af Amer: >60 mL/min (ref >60)
GFR calc non Af Amer: >60 mL/min (ref >60)
Glucose, Bld: 131 mg/dL — ABNORMAL HIGH (ref 65–99)
Potassium: 3.5 mmol/L (ref 3.5–5.1)
Sodium: 136 mmol/L (ref 135–145)

## 2015-01-12 LAB — URINALYSIS, ROUTINE W REFLEX MICROSCOPIC
Glucose, UA: NEGATIVE mg/dL
Ketones, ur: NEGATIVE mg/dL
Nitrite: NEGATIVE
Protein, ur: 30 mg/dL — AB
Specific Gravity, Urine: 1.018 (ref 1.005–1.030)
Urobilinogen, UA: 0.2 mg/dL (ref 0.0–1.0)
pH: 5.5 (ref 5.0–8.0)

## 2015-01-12 LAB — URINE MICROSCOPIC-ADD ON

## 2015-01-12 MED ORDER — ACETAMINOPHEN 500 MG PO TABS
1000.0000 mg | ORAL_TABLET | Freq: Four times a day (QID) | ORAL | Status: DC | PRN
  Filled 2015-01-12 (×2): qty 2

## 2015-01-12 MED ORDER — ACETAMINOPHEN 10 MG/ML IV SOLN
1000.0000 mg | Freq: Four times a day (QID) | INTRAVENOUS | Status: AC
  Administered 2015-01-12 – 2015-01-13 (×4): 1000 mg via INTRAVENOUS
  Filled 2015-01-12 (×5): qty 100

## 2015-01-12 NOTE — Progress Notes (Signed)
Patient ID: [name], female   DOB: 06-03-1955, 59 y.o.   MRN: [id number] 3 Days Post-Op  Subjective: Pt feels ok today.  Ostomy bag has started working and has already had to be emptied a couple of times since last night.  Minimal nausea.  Mobilizing some.  Objective: Vital signs in last 24 hours: Temp:  [97.5 F (36.4 C)-100.9 F (38.3 C)] 100.9 F (38.3 C) (09/30 0546) Pulse Rate:  [87-92] 87 (09/30 0546) Resp:  [15-26] 19 (09/30 0809) BP: (96-124)/(59-72) 124/64 mmHg (09/30 0546) SpO2:  [94 %-100 %] 100 % (09/30 0809) Last BM Date:  (colostomy with minimal bloody drainage)  Intake/Output from previous day: 09/29 0701 - 09/30 0700 In: 2646.7 [P.O.:150; I.V.:1796.7; IV Piggyback:700] Out: 2268 [Urine:1725; Emesis/NG output:100; Drains:43; Stool:400] Intake/Output this shift: Total I/O In: 69 [P.O.:30] Out: 483 [Urine:300; Drains:8; Stool:175]  PE: Abd: soft, midline wound and old colostomy site are clean and packed.  Colostomy with bilious/thick output.  Stoma is pink and viable. Both JP drains with minimal serosang output currently  Lab Results:   Recent Labs  01/11/15 0525 01/12/15 0530  WBC 6.4 1.7*  HGB 10.5* 10.1*  HCT 31.8* 30.9*  PLT 207 199   BMET  Recent Labs  01/11/15 0525 01/12/15 0530  NA 136 136  K 3.2* 3.5  CL 103 103  CO2 25 26  GLUCOSE 144* 131*  BUN 10 8  CREATININE 0.78 0.65  CALCIUM 8.1* 8.6*   PT/INR No results for input(s): LABPROT, INR in the last 72 hours. CMP     Component Value Date/Time   NA 136 01/12/2015 0530   NA 142 09/09/2013 0809   K 3.5 01/12/2015 0530   K 4.1 09/09/2013 0809   CL 103 01/12/2015 0530   CL 108* 09/14/2012 1453   CO2 26 01/12/2015 0530   CO2 21* 09/09/2013 0809   GLUCOSE 131* 01/12/2015 0530   GLUCOSE 98 09/09/2013 0809   GLUCOSE 106* 09/14/2012 1453   BUN 8 01/12/2015 0530   BUN 18.2 09/09/2013 0809   CREATININE 0.65 01/12/2015 0530   CREATININE 0.8 09/09/2013 0809   CALCIUM 8.6*  01/12/2015 0530   CALCIUM 9.4 09/09/2013 0809   PROT 7.8 01/09/2015 1822   PROT 7.0 09/09/2013 0809   ALBUMIN 4.3 01/09/2015 1822   ALBUMIN 3.7 09/09/2013 0809   AST 28 01/09/2015 1822   AST 26 09/09/2013 0809   ALT 21 01/09/2015 1822   ALT 28 09/09/2013 0809   ALKPHOS 90 01/09/2015 1822   ALKPHOS 95 09/09/2013 0809   BILITOT 0.9 01/09/2015 1822   BILITOT 0.48 09/09/2013 0809   GFRNONAA >60 01/12/2015 0530   GFRAA >60 01/12/2015 0530   Lipase     Component Value Date/Time   LIPASE 33 02/03/2014 0638       Studies/Results: No results found.  Anti-infectives: Anti-infectives    Start     Dose/Rate Route Frequency Ordered Stop   01/09/15 2015  ciprofloxacin (CIPRO) IVPB 400 mg     400 mg 200 mL/hr over 60 Minutes Intravenous Every 12 hours 01/09/15 2013     01/09/15 2015  metroNIDAZOLE (FLAGYL) IVPB 500 mg     500 mg 100 mL/hr over 60 Minutes Intravenous Every 8 hours 01/09/15 2013         Assessment/Plan  POD 3, S/p Exploratory laparotomy, resection and revision of colostomy, appendectomy, omentectomy, repair incisional hernia and placement of pelvic drains, 01/10/15, Dr. Excell Seltzer -doing well.  Advance to clear liquids today -cont  PCA until on more solid diet -cont mobilization and pulm toilet -Cipro/Flagyl D4 -put wound VAC on midline incision and old stoma site, use Y connector if able -HH going to be set up and to try and get [facility] approval for patient.  LOS: 2 days    Zariana Strub E 01/12/2015, 9:18 AM Pager: [phone]

## 2015-01-12 NOTE — Consult Note (Signed)
WOC ostomy follow up Stoma type/location: RLQ colostomy Stomal assessment/size: Oval, 1 and 1/8 inches x 1 and 3/8 inches today, edema resolving (but still present) Peristomal assessment: intact, clear Treatment options for stomal/peristomal skin: skin barrier ring Output liquid light brown effluent Ostomy pouching: 2pc. Pouching system, 2 and 3/4 inches with skin barrier ring Education provided: Patient and daughter taught that stoma is changing and that resizing will be required to protect skin Enrolled patient in [place] Discharge program: Yes  WOC wound consult note Reason for Consult: Apply NPWT to midline wound and old colostomy site Wound type:Surgical Pressure Ulcer POA: No Measurement: Midline:  15cm x 6.5cm x 5.5cm  Red, moist, scant serous drainage.  Ostomy:  3.5cm x 6.5cm x 4.5cm red, moist, scant serous Wound bed: As described above Drainage (amount, consistency, odor) As described above Periwound: Intact, dry Dressing procedure/placement/frequency: Old gauze dressings removed and wound cleansed with NS.  Midline: three piece of black foam used to obliterate dead space. Ostomy site:  1 piece of black foam used to obliterate dead space. Three (3) pieces of black foam used to connect the two wounds together for a total of 7 pieces used in dressing today.  All is covered with drape and an immediate seal is achieved at 132mmHg constant negative pressure.  Patient tolerated procedure well.  She and daughter are taught the origins of NPWT and are intrigued and excited about using the therapy to reduce healing time in the patient's wounds.  Dressings are to be changed every M-W-F.  In house, [place] will perform changes.  [facility] nursing team will follow, and will remain available to this patient, the nursing, srugical and medical teams.   Thanks, Maudie Flakes, MSN, RN, [place], [place], [place] [phone])

## 2015-01-12 NOTE — Progress Notes (Signed)
Spoke with pt concerning [facility]. Confirmed pt's address and pt understands that [facility] will care for Wound using [facility]. Forms signed and faxed to [facility] in house rep Ricki.

## 2015-01-13 LAB — BASIC METABOLIC PANEL
Anion gap: 10 (ref 5–15)
BUN: 6 mg/dL (ref 6–20)
CO2: 27 mmol/L (ref 22–32)
Calcium: 8.4 mg/dL — ABNORMAL LOW (ref 8.9–10.3)
Chloride: 98 mmol/L — ABNORMAL LOW (ref 101–111)
Creatinine, Ser: 0.66 mg/dL (ref 0.44–1.00)
GFR calc Af Amer: >60 mL/min (ref >60)
GFR calc non Af Amer: >60 mL/min (ref >60)
Glucose, Bld: 111 mg/dL — ABNORMAL HIGH (ref 65–99)
Potassium: 3 mmol/L — ABNORMAL LOW (ref 3.5–5.1)
Sodium: 135 mmol/L (ref 135–145)

## 2015-01-13 LAB — CBC
HCT: 26.9 % — ABNORMAL LOW (ref 36.0–46.0)
Hemoglobin: 9 g/dL — ABNORMAL LOW (ref 12.0–15.0)
MCH: 30.5 pg (ref 26.0–34.0)
MCHC: 33.5 g/dL (ref 30.0–36.0)
MCV: 91.2 fL (ref 78.0–100.0)
Platelets: 199 10*3/uL (ref 150–400)
RBC: 2.95 MIL/uL — ABNORMAL LOW (ref 3.87–5.11)
RDW: 14.5 % (ref 11.5–15.5)
WBC: 2.5 10*3/uL — ABNORMAL LOW (ref 4.0–10.5)

## 2015-01-13 NOTE — Care Management Important Message (Signed)
Important Message  Patient Details  Name: COLTON ENGDAHL MRN: [id number] Date of Birth: Sep 03, 1955   Medicare Important Message Given:  Yes-second notification given    Erenest Rasher, RN 01/13/2015, 6:30 PM

## 2015-01-13 NOTE — Care Management Note (Addendum)
Case Management Note  Patient Details  Name: [name] MRN: [id number] Date of Birth: 1955/08/28  Subjective/Objective:       S/p Exploratory laparotomy, resection and revision of colostomy, appendectomy, omentectomy, repair incisional hernia and placement of pelvic drains             Action/Plan:  Home Health Expected Discharge Date:  01/15/2015              Expected Discharge Plan:  [place]  In-House Referral:     Discharge planning Services  CM Consult  Post Acute Care Choice:  Home Health Choice offered to:  Patient  DME Arranged:  Wound vac DME Agency:  [facility]  HH Arranged:  RN [place] Agency:  [facility]  Status of Service:  Completed, signed off  Medicare Important Message Given:    Date Medicare IM Given:    Medicare IM give by:    Date Additional Medicare IM Given:    Additional Medicare Important Message give by:     If discussed at [place] of Stay Meetings, dates discussed:    Additional Comments: 01/14/2015 Spoke to [facility] rep for new referral for [facility]. Made aware pt will be going home with wound vac. Jonnie Finner RN CCM Case Mgmt phone [phone]  01/13/2015, 6:27 PM Faxed additional info, [place] RN note and op note to [facility] to process wound vac, spoke to [facility] rep and wound vac order is being processed. HH set up with [facility]. Will follow up with [facility] for new referral for [facility] RN for wound vac.   Erenest Rasher, RN 01/13/2015, 6:27 PM

## 2015-01-13 NOTE — Evaluation (Signed)
Occupational Therapy Evaluation Patient Details Name: [name] MRN: [id number] DOB: 11-08-1955 Today's Date: 01/13/2015    History of Present Illness 59 yo female with history  of rectal cancer, colostomy. admitted following routine colonoscopy. Admitted  for  9//27 Procedure(s): Exploratory laparotomy, resection and revision of colostomy, appendectomy, omentectomy, repair incisional hernia and placement of pelvic drains   Clinical Impression   Pt admitted with abdominal surgery. Pt currently with functional limitations due to the deficits listed below (see OT Problem List).  Pt will benefit from skilled OT to increase their safety and independence with ADL and functional mobility for ADL to facilitate discharge to venue listed below.      Follow Up Recommendations  Home health OT    Equipment Recommendations  None recommended by OT       Precautions / Restrictions Precautions Precaution Comments: coloscopy, VAC, R drain      Mobility Bed Mobility Overal bed mobility: Modified Independent             General bed mobility comments: extra time for pain  Transfers Overall transfer level: Needs assistance Equipment used: Rolling walker (2 wheeled) Transfers: Sit to/from Stand Sit to Stand: Supervision                   ADL Overall ADL's : Needs assistance/impaired                     Lower Body Dressing: Minimal assistance;Sit to/from stand;Cueing for safety;Cueing for sequencing   Toilet Transfer: Min guard   Toileting- Clothing Manipulation and Hygiene: Min guard;Sit to/from stand       Functional mobility during ADLs: Min guard;Cueing for safety                 Pertinent Vitals/Pain Pain Assessment: 0-10 Pain Score: 6  Pain Location: abdomen Pain Descriptors / Indicators: Cramping Pain Intervention(s): Limited activity within patient's tolerance;Monitored during session;PCA encouraged        Extremity/Trunk Assessment Upper  Extremity Assessment Upper Extremity Assessment: Overall WFL for tasks assessed   Lower Extremity Assessment Lower Extremity Assessment: Overall WFL for tasks assessed       Communication Communication Communication: No difficulties   Cognition Arousal/Alertness: Awake/alert Behavior During Therapy: WFL for tasks assessed/performed Overall Cognitive Status: Within Functional Limits for tasks assessed                                Home Living Family/patient expects to be discharged to:: Private residence Living Arrangements: Other relatives Available Help at Discharge: Family Type of Home: House Home Access: Level entry     Home Layout: One level     Bathroom Shower/Tub: Teacher, early years/pre: Standard     Home Equipment: Environmental consultant - 2 wheels;Cane - single point   Additional Comments: stays w/ sister      Prior Functioning/Environment Level of Independence: Independent             OT Diagnosis: Generalized weakness   OT Problem List: Decreased strength;Decreased activity tolerance   OT Treatment/Interventions: Self-care/ADL training;DME and/or AE instruction;Patient/family education    OT Goals(Current goals can be found in the care plan section) Acute Rehab OT Goals Patient Stated Goal: go to sisters OT Goal Formulation: With patient Time For Goal Achievement: 01/27/15 Potential to Achieve Goals: Good  OT Frequency: Min 2X/week   Barriers to D/C:  Co-evaluation PT/OT/SLP Co-Evaluation/Treatment: Yes Reason for Co-Treatment: For patient/therapist safety   OT goals addressed during session: ADL's and self-care      End of Session Nurse Communication: Mobility status  Activity Tolerance: Patient tolerated treatment well Patient left: in bed;with call bell/phone within reach   Time: 1555-1614 OT Time Calculation (min): 19 min Charges:  OT General Charges $OT Visit: 1 Procedure OT Evaluation $Initial OT  Evaluation Tier I: 1 Procedure G-Codes:    Betsy Pries 01-20-2015, 4:32 PM

## 2015-01-13 NOTE — Evaluation (Signed)
Physical Therapy Evaluation Patient Details Name: [name] MRN: [id number] DOB: 01-15-1956 Today's Date: 01/13/2015   History of Present Illness  59 yo female with history  of rectal cancer, colostomy. admitted following routine colonoscopy. Admitted  for  9//27 Procedure(s): Exploratory laparotomy, resection and revision of colostomy, appendectomy, omentectomy, repair incisional hernia and placement of pelvic drains  Clinical Impression  Patient motivated to ambulate in hall today. Patient will benefit from PT to address problems listed in note below.( PT problem list) in order to return home at independent level  Follow Up Recommendations No PT follow up    Equipment Recommendations  None recommended by PT    Recommendations for Other Services       Precautions / Restrictions Precautions Precaution Comments: coloscopy, VAC, R drain      Mobility  Bed Mobility Overal bed mobility: Modified Independent             General bed mobility comments: extra time for pain  Transfers Overall transfer level: Needs assistance Equipment used: Rolling walker (2 wheeled) Transfers: Sit to/from Stand Sit to Stand: Supervision            Ambulation/Gait Ambulation/Gait assistance: Supervision Ambulation Distance (Feet): 350 Feet Assistive device: Rolling walker (2 wheeled) Gait Pattern/deviations: Step-through pattern        Stairs            Wheelchair Mobility    Modified Rankin (Stroke Patients Only)       Balance                                             Pertinent Vitals/Pain Pain Assessment: 0-10 Pain Score: 6  Pain Location: abdomen Pain Descriptors / Indicators: Cramping Pain Intervention(s): Limited activity within patient's tolerance;Monitored during session;PCA encouraged    Home Living Family/patient expects to be discharged to:: Private residence Living Arrangements: Other relatives Available Help at Discharge:  Family Type of Home: House Home Access: Level entry     Home Layout: One level Home Equipment: Environmental consultant - 2 wheels;Cane - single point Additional Comments: stays w/ sister    Prior Function Level of Independence: Independent               Hand Dominance        Extremity/Trunk Assessment   Upper Extremity Assessment: Overall WFL for tasks assessed           Lower Extremity Assessment: Overall WFL for tasks assessed         Communication   Communication: No difficulties  Cognition Arousal/Alertness: Awake/alert Behavior During Therapy: WFL for tasks assessed/performed Overall Cognitive Status: Within Functional Limits for tasks assessed                      General Comments      Exercises        Assessment/Plan    PT Assessment Patient needs continued PT services  PT Diagnosis Generalized weakness   PT Problem List Decreased activity tolerance;Decreased knowledge of precautions;Decreased mobility  PT Treatment Interventions DME instruction;Gait training;Functional mobility training;Therapeutic activities;Therapeutic exercise;Patient/family education   PT Goals (Current goals can be found in the Care Plan section) Acute Rehab PT Goals Patient Stated Goal: go to sisters PT Goal Formulation: With patient Time For Goal Achievement: 01/27/15 Potential to Achieve Goals: Good    Frequency Min 3X/week   Barriers  to discharge        Co-evaluation PT/OT/SLP Co-Evaluation/Treatment: Yes Reason for Co-Treatment: For patient/therapist safety PT goals addressed during session: Mobility/safety with mobility OT goals addressed during session: ADL's and self-care       End of Session   Activity Tolerance: Patient tolerated treatment well Patient left: in bed;with call bell/phone within reach Nurse Communication: Mobility status         Time: 2449-7530 PT Time Calculation (min) (ACUTE ONLY): 17 min   Charges:         PT G CodesClaretha Cooper 01/13/2015, 4:48 PM Tresa Endo PT [phone]

## 2015-01-13 NOTE — Progress Notes (Signed)
Patient ID: [name], female   DOB: 11/23/1955, 59 y.o.   MRN: [id number] [facility] Surgery Progress Note:   4 Days Post-Op  Subjective: Mental status is clear. No new complaints Objective: Vital signs in last 24 hours: Temp:  [99 F (37.2 C)-102.7 F (39.3 C)] 99.3 F (37.4 C) (10/01 0643) Pulse Rate:  [83-94] 83 (10/01 0643) Resp:  [16-23] 17 (10/01 0733) BP: (115-128)/(67-76) 115/67 mmHg (10/01 0643) SpO2:  [98 %-100 %] 100 % (10/01 0733)  Intake/Output from previous day: 09/30 0701 - 10/01 0700 In: 3856.3 [P.O.:990; I.V.:1866.3; IV Piggyback:1000] Out: 2093 [Urine:1200; Drains:43; Stool:850] Intake/Output this shift: Total I/O In: -  Out: 50 [Stool:50]  Physical Exam: Work of breathing is not elevated.  Ostomy pink with some liquid.  JPs in place  Lab Results:  Results for orders placed or performed during the hospital encounter of 01/09/15 (from the past 48 hour(s))  CBC     Status: Abnormal   Collection Time: 01/12/15  5:30 AM  Result Value Ref Range   WBC 1.7 (L) 4.0 - 10.5 K/uL   RBC 3.30 (L) 3.87 - 5.11 MIL/uL   Hemoglobin 10.1 (L) 12.0 - 15.0 g/dL   HCT 30.9 (L) 36.0 - 46.0 %   MCV 93.6 78.0 - 100.0 fL   MCH 30.6 26.0 - 34.0 pg   MCHC 32.7 30.0 - 36.0 g/dL   RDW 14.5 11.5 - 15.5 %   Platelets 199 150 - 400 K/uL  Basic metabolic panel     Status: Abnormal   Collection Time: 01/12/15  5:30 AM  Result Value Ref Range   Sodium 136 135 - 145 mmol/L   Potassium 3.5 3.5 - 5.1 mmol/L   Chloride 103 101 - 111 mmol/L   CO2 26 22 - 32 mmol/L   Glucose, Bld 131 (H) 65 - 99 mg/dL   BUN 8 6 - 20 mg/dL   Creatinine, Ser 0.65 0.44 - 1.00 mg/dL   Calcium 8.6 (L) 8.9 - 10.3 mg/dL   GFR calc non Af Amer >60 >60 mL/min   GFR calc Af Amer >60 >60 mL/min    Comment: (NOTE) The eGFR has been calculated using the CKD EPI equation. This calculation has not been validated in all clinical situations. eGFR's persistently <60 mL/min signify possible Chronic  Kidney Disease.    Anion gap 7 5 - 15  Urinalysis, Routine w reflex microscopic (not at [facility])     Status: Abnormal   Collection Time: 01/12/15  6:46 PM  Result Value Ref Range   Color, Urine AMBER (A) YELLOW    Comment: BIOCHEMICALS MAY BE AFFECTED BY COLOR   APPearance HAZY (A) CLEAR   Specific Gravity, Urine 1.018 1.005 - 1.030   pH 5.5 5.0 - 8.0   Glucose, UA NEGATIVE NEGATIVE mg/dL   Hgb urine dipstick SMALL (A) NEGATIVE   Bilirubin Urine MODERATE (A) NEGATIVE   Ketones, ur NEGATIVE NEGATIVE mg/dL   Protein, ur 30 (A) NEGATIVE mg/dL   Urobilinogen, UA 0.2 0.0 - 1.0 mg/dL   Nitrite NEGATIVE NEGATIVE   Leukocytes, UA SMALL (A) NEGATIVE  Urine microscopic-add on     Status: Abnormal   Collection Time: 01/12/15  6:46 PM  Result Value Ref Range   WBC, UA 0-2 <3 WBC/hpf   RBC / HPF 3-6 <3 RBC/hpf   Bacteria, UA RARE RARE   Casts GRANULAR CAST (A) NEGATIVE   Urine-Other AMORPHOUS URATES/PHOSPHATES     Comment: MUCOUS PRESENT  CBC  Status: Abnormal   Collection Time: 01/13/15  5:05 AM  Result Value Ref Range   WBC 2.5 (L) 4.0 - 10.5 K/uL   RBC 2.95 (L) 3.87 - 5.11 MIL/uL   Hemoglobin 9.0 (L) 12.0 - 15.0 g/dL   HCT 26.9 (L) 36.0 - 46.0 %   MCV 91.2 78.0 - 100.0 fL   MCH 30.5 26.0 - 34.0 pg   MCHC 33.5 30.0 - 36.0 g/dL   RDW 14.5 11.5 - 15.5 %   Platelets 199 150 - 400 K/uL  Basic metabolic panel     Status: Abnormal   Collection Time: 01/13/15  5:05 AM  Result Value Ref Range   Sodium 135 135 - 145 mmol/L   Potassium 3.0 (L) 3.5 - 5.1 mmol/L   Chloride 98 (L) 101 - 111 mmol/L   CO2 27 22 - 32 mmol/L   Glucose, Bld 111 (H) 65 - 99 mg/dL   BUN 6 6 - 20 mg/dL   Creatinine, Ser 0.66 0.44 - 1.00 mg/dL   Calcium 8.4 (L) 8.9 - 10.3 mg/dL   GFR calc non Af Amer >60 >60 mL/min   GFR calc Af Amer >60 >60 mL/min    Comment: (NOTE) The eGFR has been calculated using the CKD EPI equation. This calculation has not been validated in all clinical situations. eGFR's persistently  <60 mL/min signify possible Chronic Kidney Disease.    Anion gap 10 5 - 15    Radiology/Results: No results found.  Anti-infectives: Anti-infectives    Start     Dose/Rate Route Frequency Ordered Stop   01/09/15 2015  ciprofloxacin (CIPRO) IVPB 400 mg     400 mg 200 mL/hr over 60 Minutes Intravenous Every 12 hours 01/09/15 2013     01/09/15 2015  metroNIDAZOLE (FLAGYL) IVPB 500 mg     500 mg 100 mL/hr over 60 Minutes Intravenous Every 8 hours 01/09/15 2013        Assessment/Plan: Problem List: Patient Active Problem List   Diagnosis Date Noted  . Colon perforation 01/10/2015  . Bursitis of right shoulder 01/19/2014  . Tibial plateau fracture, left 01/19/2014  . Multiple falls 01/19/2014  . Colostomy malfunction 12/09/2012  . Peristomal hernia 04/19/2012  . Depression   . Seborrheic dermatitis 08/09/2010  . PERSONAL HX RECTAL CANCER 06/05/2010  . SMOKER 04/16/2010  . ALLERGIC RHINITIS 04/16/2010  . UNSPECIFIED ANEMIA 12/06/2009  . 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 Essential hypertension 12/06/2009  . FATIGUE 12/06/2009  . DIARRHEA 12/06/2009    Taking clear liquids OK.  Advance to full liquids 4 Days Post-Op    LOS: 3 days   Matt B. Hassell Done, MD, [facility] [phone] beeper [phone]  01/13/2015 9:38 AM

## 2015-01-13 NOTE — Progress Notes (Signed)
PT Cancellation Note  Patient Details Name: [name] MRN: [id number] DOB: [birth year]   Cancelled Treatment:    Reason Eval/Treat Not Completed: Medical issues which prohibited therapy (patient took medication and is very drowsy, will walk after an hour. )will see in PM.    Claretha Cooper 01/13/2015, 12:15 PM

## 2015-01-14 NOTE — Consult Note (Signed)
WOC ostomy follow up Stoma type/location: RLQ Colostomy Stomal assessment/size: Oval, 1 and 1/4 inches x 1 and 5/8 inches, red, moist, edema resolving Peristomal assessment: intact, clear Treatment options for stomal/peristomal skin: Skin barrier ring Output: Small amount of green effluent in pouch Ostomy pouching: 2pc., 2 and 3/4 inch pouching system with skin barrier ring Education provided: Patient reports that she has no questions about ostomy; she is comfortable with measuring her stoma and recognizes that she has an oval stoma.  She intends to still use a barrier ring as she believes that they extend the wear time of her supplies. Enrolled patient in [place] Start Discharge program: Yes  WOC wound follow up Wound type:Midline surgical and old colostomy site are bridged together and attached to NPWT Measurement: 16cm x 6.5cm x 3.5cm.  Red, moist, serosanguinous drainage.  Mild bleeding that stops with pressure application at dressing removal.  Ostomy:  3.5cm x 6.5cm x 4.0cm Wound bed:red, moist.  Dressings are firmly adherent to wound bed so I will use white foam as wound contact layer with dressing change. Drainage (amount, consistency, odor) Serosanguinous Periwound:intact, dry Dressing procedure/placement/frequency: I am here on the unit this pm, so I will change pouch and NPWT dressings in advance of due date (tomorrow am). Old dressings removed and wounds cleansed with NS and gently patted dry. Sponge was firmly adherent to wound, upon removal some bleeding noted and patient was uncomfortable and using PCA.  White foam used as wound contact layer and black foam used on top of it this dressing change in hopes that we will have an atraumatic dressing change on Wednesday. One piece of white foam used in wound bed of both wounds (2) and two pieces of black foam used on top of it (2).  Wounds are bridged together after protecting intact skin with drape and another piece of black foam  (1) used as bridge.  Toal sponges used = 2 white and three black.  Drape is applied to achieve a seal and T.R.A.C. pad attached. NPWT reinitiated at 128mmHg continuous pressure and tolerated well.  Patient is conversive and appreciative of nursing care.  Next dressing change is due on Wednesday, January 17, 2015.  [facility] nursing team will follow, and will remain available to this patient, the nursing, surgical and medical teams.   Thanks, Maudie Flakes, MSN, RN, [place], [place], [place] [phone])

## 2015-01-14 NOTE — Progress Notes (Signed)
Patient ID: [name], female   DOB: 11/14/1955, 59 y.o.   MRN: [id number] [facility] Surgery Progress Note:   4 Days Post-Op  Subjective: Doing well.  Not tolerating full liquids well, but ok with clears Objective: Vital signs in last 24 hours: Temp:  [98.2 F (36.8 C)-99.2 F (37.3 C)] 98.4 F (36.9 C) (10/02 0529) Pulse Rate:  [79-85] 85 (10/02 0529) Resp:  [15-23] 20 (10/02 0529) BP: (112-123)/(62-75) 117/66 mmHg (10/02 0529) SpO2:  [99 %-100 %] 100 % (10/02 0529)  Intake/Output from previous day: 10/01 0701 - 10/02 0700 In: 2098.8 [P.O.:360; I.V.:1738.8] Out: 1805 [Urine:1500; Drains:30; Stool:275] Intake/Output this shift: Total I/O In: 900 [I.V.:300; IV Piggyback:600] Out: 1550 [Urine:1500; Stool:50]  Physical Exam: NAD.  Ostomy pink with some liquid.  JPs in place and serosanguinous.  Wound vac in place  Lab Results:  Results for orders placed or performed during the hospital encounter of 01/09/15 (from the past 48 hour(s))  Culture, blood (routine x 2)     Status: None (Preliminary result)   Collection Time: 01/12/15  5:17 PM  Result Value Ref Range   Specimen Description BLOOD LEFT ARM    Special Requests BOTTLES DRAWN AEROBIC AND ANAEROBIC 10CC    Culture      NO GROWTH < 24 HOURS Performed at [facility]    Report Status PENDING   Culture, blood (routine x 2)     Status: None (Preliminary result)   Collection Time: 01/12/15  5:24 PM  Result Value Ref Range   Specimen Description BLOOD LEFT ANTECUBITAL    Special Requests BOTTLES DRAWN AEROBIC AND ANAEROBIC 10CC    Culture      NO GROWTH < 24 HOURS Performed at [facility]    Report Status PENDING   Urinalysis, Routine w reflex microscopic (not at [facility])     Status: Abnormal   Collection Time: 01/12/15  6:46 PM  Result Value Ref Range   Color, Urine AMBER (A) YELLOW    Comment: BIOCHEMICALS MAY BE AFFECTED BY COLOR   APPearance HAZY (A) CLEAR   Specific Gravity, Urine 1.018  1.005 - 1.030   pH 5.5 5.0 - 8.0   Glucose, UA NEGATIVE NEGATIVE mg/dL   Hgb urine dipstick SMALL (A) NEGATIVE   Bilirubin Urine MODERATE (A) NEGATIVE   Ketones, ur NEGATIVE NEGATIVE mg/dL   Protein, ur 30 (A) NEGATIVE mg/dL   Urobilinogen, UA 0.2 0.0 - 1.0 mg/dL   Nitrite NEGATIVE NEGATIVE   Leukocytes, UA SMALL (A) NEGATIVE  Urine microscopic-add on     Status: Abnormal   Collection Time: 01/12/15  6:46 PM  Result Value Ref Range   WBC, UA 0-2 <3 WBC/hpf   RBC / HPF 3-6 <3 RBC/hpf   Bacteria, UA RARE RARE   Casts GRANULAR CAST (A) NEGATIVE   Urine-Other AMORPHOUS URATES/PHOSPHATES     Comment: MUCOUS PRESENT  CBC     Status: Abnormal   Collection Time: 01/13/15  5:05 AM  Result Value Ref Range   WBC 2.5 (L) 4.0 - 10.5 K/uL   RBC 2.95 (L) 3.87 - 5.11 MIL/uL   Hemoglobin 9.0 (L) 12.0 - 15.0 g/dL   HCT 26.9 (L) 36.0 - 46.0 %   MCV 91.2 78.0 - 100.0 fL   MCH 30.5 26.0 - 34.0 pg   MCHC 33.5 30.0 - 36.0 g/dL   RDW 14.5 11.5 - 15.5 %   Platelets 199 150 - 400 K/uL  Basic metabolic panel  Status: Abnormal   Collection Time: 01/13/15  5:05 AM  Result Value Ref Range   Sodium 135 135 - 145 mmol/L   Potassium 3.0 (L) 3.5 - 5.1 mmol/L   Chloride 98 (L) 101 - 111 mmol/L   CO2 27 22 - 32 mmol/L   Glucose, Bld 111 (H) 65 - 99 mg/dL   BUN 6 6 - 20 mg/dL   Creatinine, Ser 0.66 0.44 - 1.00 mg/dL   Calcium 8.4 (L) 8.9 - 10.3 mg/dL   GFR calc non Af Amer >60 >60 mL/min   GFR calc Af Amer >60 >60 mL/min    Comment: (NOTE) The eGFR has been calculated using the CKD EPI equation. This calculation has not been validated in all clinical situations. eGFR's persistently <60 mL/min signify possible Chronic Kidney Disease.    Anion gap 10 5 - 15    Radiology/Results: No results found.  Anti-infectives: Anti-infectives    Start     Dose/Rate Route Frequency Ordered Stop   01/09/15 2015  ciprofloxacin (CIPRO) IVPB 400 mg     400 mg 200 mL/hr over 60 Minutes Intravenous Every 12  hours 01/09/15 2013     01/09/15 2015  metroNIDAZOLE (FLAGYL) IVPB 500 mg     500 mg 100 mL/hr over 60 Minutes Intravenous Every 8 hours 01/09/15 2013        Assessment/Plan: Problem List: Patient Active Problem List   Diagnosis Date Noted  . Colon perforation ([place]) 01/10/2015  . Bursitis of right shoulder 01/19/2014  . Tibial plateau fracture, left 01/19/2014  . Multiple falls 01/19/2014  . Colostomy malfunction ([place]) 12/09/2012  . Peristomal hernia 04/19/2012  . Depression   . Seborrheic dermatitis 08/09/2010  . PERSONAL HX RECTAL CANCER 06/05/2010  . SMOKER 04/16/2010  . ALLERGIC RHINITIS 04/16/2010  . UNSPECIFIED ANEMIA 12/06/2009  . 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 Essential hypertension 12/06/2009  . FATIGUE 12/06/2009  . DIARRHEA 12/06/2009    Taking clear liquids OK.  Cont full liquids as tolerated Ambulate  4 Days Post-Op    LOS: 4 days   Rosario Adie, MD  [facility]   01/14/2015 11:17 AM

## 2015-01-15 LAB — CBC
HCT: 26.8 % — ABNORMAL LOW (ref 36.0–46.0)
Hemoglobin: 9 g/dL — ABNORMAL LOW (ref 12.0–15.0)
MCH: 30.3 pg (ref 26.0–34.0)
MCHC: 33.6 g/dL (ref 30.0–36.0)
MCV: 90.2 fL (ref 78.0–100.0)
Platelets: 277 10*3/uL (ref 150–400)
RBC: 2.97 MIL/uL — ABNORMAL LOW (ref 3.87–5.11)
RDW: 14.7 % (ref 11.5–15.5)
WBC: 7.4 10*3/uL (ref 4.0–10.5)

## 2015-01-15 LAB — BASIC METABOLIC PANEL
Anion gap: 9 (ref 5–15)
BUN: <5 mg/dL — ABNORMAL LOW (ref 6–20)
CO2: 29 mmol/L (ref 22–32)
Calcium: 7.9 mg/dL — ABNORMAL LOW (ref 8.9–10.3)
Chloride: 93 mmol/L — ABNORMAL LOW (ref 101–111)
Creatinine, Ser: 0.5 mg/dL (ref 0.44–1.00)
GFR calc Af Amer: >60 mL/min (ref >60)
GFR calc non Af Amer: >60 mL/min (ref >60)
Glucose, Bld: 140 mg/dL — ABNORMAL HIGH (ref 65–99)
Potassium: 2.8 mmol/L — ABNORMAL LOW (ref 3.5–5.1)
Sodium: 131 mmol/L — ABNORMAL LOW (ref 135–145)

## 2015-01-15 MED ORDER — OXYCODONE-ACETAMINOPHEN 5-325 MG PO TABS: 1.0000 | ORAL_TABLET | ORAL | Status: DC | PRN

## 2015-01-15 MED ORDER — GABAPENTIN 400 MG PO CAPS
1200.0000 mg | ORAL_CAPSULE | Freq: Three times a day (TID) | ORAL | Status: DC
  Administered 2015-01-15 – 2015-01-22 (×22): 1200 mg via ORAL
  Filled 2015-01-15 (×26): qty 3

## 2015-01-15 MED ORDER — ALPRAZOLAM 0.5 MG PO TABS
0.5000 mg | ORAL_TABLET | Freq: Two times a day (BID) | ORAL | Status: DC | PRN
  Administered 2015-01-20: 0.5 mg via ORAL
  Filled 2015-01-15 (×2): qty 1

## 2015-01-15 MED ORDER — AMITRIPTYLINE HCL 25 MG PO TABS
25.0000 mg | ORAL_TABLET | Freq: Every evening | ORAL | Status: DC | PRN
Start: 2015-01-15 — End: 2015-01-22
  Filled 2015-01-15: qty 1

## 2015-01-15 MED ORDER — LISINOPRIL 20 MG PO TABS
20.0000 mg | ORAL_TABLET | Freq: Every day | ORAL | Status: DC
  Administered 2015-01-20: 20 mg via ORAL
  Filled 2015-01-15 (×8): qty 1

## 2015-01-15 MED ORDER — CITALOPRAM HYDROBROMIDE 20 MG PO TABS
20.0000 mg | ORAL_TABLET | Freq: Every morning | ORAL | Status: DC
  Administered 2015-01-20: 20 mg via ORAL
  Filled 2015-01-15 (×8): qty 1

## 2015-01-15 MED ORDER — HYDROCHLOROTHIAZIDE 25 MG PO TABS
25.0000 mg | ORAL_TABLET | Freq: Every morning | ORAL | Status: DC
  Administered 2015-01-18 – 2015-01-22 (×4): 25 mg via ORAL
  Filled 2015-01-15 (×8): qty 1

## 2015-01-15 NOTE — Progress Notes (Signed)
Visited patient today and have been following with her through her stay. I answered any questions she had. She is in good spirits, pain controlled, advancing diet as she tolerates. Hopefully discharge sometime this week. The small polyp removed in her right colon during colonoscopy returned and was benign. I appreciate the excellent surgical care she has received. Please page if there is anything I can do or help with.    Cellar, MD [facility]astro[facility]ology Pager [phone]

## 2015-01-15 NOTE — Progress Notes (Signed)
Patient ID: [name], female   DOB: May 26, 1955, 59 y.o.   MRN: [id number] 6 Days Post-Op  Subjective: In good spirits today. Wants to work toward going home. Pain controlled but still using PCA. Taking full liquids without nausea today and colostomy has been functioning.  Objective: Vital signs in last 24 hours: Temp:  [98.3 F (36.8 C)-100.1 F (37.8 C)] 99.8 F (37.7 C) (10/03 0539) Pulse Rate:  [79-84] 82 (10/03 0539) Resp:  [16-21] 20 (10/03 0539) BP: (99-111)/(53-66) 99/61 mmHg (10/03 0539) SpO2:  [95 %-100 %] 100 % (10/03 0539) Last BM Date: 01/14/15  Intake/Output from previous day: 10/02 0701 - 10/03 0700 In: 2100 [I.V.:1500; IV Piggyback:600] Out: 0762 [Urine:3800; Drains:35; Stool:50] Intake/Output this shift:    General appearance: alert, cooperative and no distress GI: abnormal findings:  mild tenderness in the entire abdomen and colostomy  pink and healthy Incision/Wound: VAC dressing in place, dry and intact, no erythema. JP drains with minimal serosanguineous drainage  Lab Results:   Recent Labs  01/13/15 0505  WBC 2.5*  HGB 9.0*  HCT 26.9*  PLT 199   BMET  Recent Labs  01/13/15 0505  NA 135  K 3.0*  CL 98*  CO2 27  GLUCOSE 111*  BUN 6  CREATININE 0.66  CALCIUM 8.4*     Studies/Results: Recent Results (from the past 240 hour(s))  Culture, blood (routine x 2)     Status: None (Preliminary result)   Collection Time: 01/12/15  5:17 PM  Result Value Ref Range Status   Specimen Description BLOOD LEFT ARM  Final   Special Requests BOTTLES DRAWN AEROBIC AND ANAEROBIC 10CC  Final   Culture   Final    NO GROWTH 2 DAYS Performed at [facility]    Report Status PENDING  Incomplete  Culture, blood (routine x 2)     Status: None (Preliminary result)   Collection Time: 01/12/15  5:24 PM  Result Value Ref Range Status   Specimen Description BLOOD LEFT ANTECUBITAL  Final   Special Requests BOTTLES DRAWN AEROBIC AND ANAEROBIC 10CC   Final   Culture   Final    NO GROWTH 2 DAYS Performed at [facility]    Report Status PENDING  Incomplete    Anti-infectives: Anti-infectives    Start     Dose/Rate Route Frequency Ordered Stop   01/09/15 2015  ciprofloxacin (CIPRO) IVPB 400 mg     400 mg 200 mL/hr over 60 Minutes Intravenous Every 12 hours 01/09/15 2013     01/09/15 2015  metroNIDAZOLE (FLAGYL) IVPB 500 mg     500 mg 100 mL/hr over 60 Minutes Intravenous Every 8 hours 01/09/15 2013        Assessment/Plan: s/p Procedure(s): Exploratory laparotomy,  Resection and re-siting of colostomy, repair of ventral hernia and pelvic drainage Status post perforation post colonoscopy, probable rectosigmoid in radiated friable bowel History of rectal cancer with radiation and chemotherapy and colostomy due to chronic incontinence Fever last week, negative cultures and this is improved. Continue wound care, IV antibiotics, physical therapy. Try oral pain meds. Hypokalemia and leukopenia. Recheck labs    LOS: 5 days    [name] T 01/15/2015

## 2015-01-15 NOTE — Progress Notes (Signed)
Occupational Therapy Treatment Patient Details Name: DRUCELLA KARBOWSKI MRN: [id number] DOB: Feb 03, 1956 Today's Date: 01/15/2015    History of present illness 59 yo female with history  of rectal cancer, colostomy. admitted following routine colonoscopy. Admitted  for  9//27 Procedure(s): Exploratory laparotomy, resection and revision of colostomy, appendectomy, omentectomy, repair incisional hernia and placement of pelvic drains   OT comments  Pt doing wel!  Follow Up Recommendations  Home health OT    Equipment Recommendations  None recommended by OT    Recommendations for Other Services      Precautions / Restrictions Precautions Precaution Comments: coloscopy, VAC, R drain       Mobility Bed Mobility Overal bed mobility: Modified Independent             General bed mobility comments: extra time for pain  Transfers Overall transfer level: Needs assistance Equipment used: Rolling walker (2 wheeled) Transfers: Sit to/from Stand Sit to Stand: Supervision                  ADL Overall ADL's : Needs assistance/impaired     Grooming: Standing;Supervision/safety               Lower Body Dressing: Supervision/safety;Sit to/from stand Lower Body Dressing Details (indicate cue type and reason): pt able to bring legs up to her and did great Toilet Transfer: Supervision/safety;BSC;Ambulation             General ADL Comments: S due lines                 Cognition   Behavior During Therapy: WFL for tasks assessed/performed Overall Cognitive Status: Within Functional Limits for tasks assessed                               General Comments      Pertinent Vitals/ Pain       Pain Score: 3  Pain Location: abdomen Pain Descriptors / Indicators: Sore     Prior Functioning/Environment              Frequency Min 2X/week     Progress Toward Goals  OT Goals(current goals can now be found in the care plan section)  Progress towards  OT goals: Progressing toward goals     Plan Discharge plan remains appropriate       End of Session     Activity Tolerance Patient tolerated treatment well   Patient Left in bed;with call bell/phone within reach   Nurse Communication Mobility status        Time: 1010-1040 OT Time Calculation (min): 30 min  Charges: OT General Charges $OT Visit: 1 Procedure OT Treatments $Self Care/Home Management : 23-37 mins  Rivers Hamrick, Thereasa Parkin 01/15/2015, 10:44 AM

## 2015-01-15 NOTE — Progress Notes (Signed)
Physical Therapy Treatment Patient Details Name: [name] MRN: [id number] DOB: 1956/02/26 Today's Date: 01/15/2015    History of Present Illness 59 yo female with history  of rectal cancer, colostomy. admitted following routine colonoscopy. Admitted  for  9//27 Procedure(s): Exploratory laparotomy, resection and revision of colostomy, appendectomy, omentectomy, repair incisional hernia and placement of pelvic drains    PT Comments    Pt progressing well.  Able to don and doff own socks.  Able to get self OOB and assisted to bathroom then amb a great distance in hallway with NO AD.  Good alternating gait.  Good functional speed and NO LOB.  Pt motivated and eager to D/C to home.   Follow Up Recommendations  No PT follow up     Equipment Recommendations  None recommended by PT    Recommendations for Other Services       Precautions / Restrictions Restrictions Weight Bearing Restrictions: No    Mobility  Bed Mobility Overal bed mobility: Modified Independent             General bed mobility comments: extra time for pain but able without assist  Transfers Overall transfer level: Needs assistance Equipment used: None   Sit to Stand: Supervision         General transfer comment: good safety cognition and use of hands to staedy self  Ambulation/Gait Ambulation/Gait assistance: Supervision Ambulation Distance (Feet): 400 Feet Assistive device: None Gait Pattern/deviations: Step-through pattern;Decreased stride length;Trunk flexed     General Gait Details: slightly decreased but functional.  Amb without an AD this time.    Stairs            Wheelchair Mobility    Modified Rankin (Stroke Patients Only)       Balance                                    Cognition Arousal/Alertness: Awake/alert Behavior During Therapy: WFL for tasks assessed/performed Overall Cognitive Status: Within Functional Limits for tasks assessed                       Exercises      General Comments        Pertinent Vitals/Pain Pain Assessment: 0-10 Pain Score: 4  Pain Location: ABD Pain Descriptors / Indicators: Sore;Tightness Pain Intervention(s): Monitored during session;Repositioned    Home Living                      Prior Function            PT Goals (current goals can now be found in the care plan section) Progress towards PT goals: Progressing toward goals    Frequency  Min 3X/week    PT Plan      Co-evaluation             End of Session Equipment Utilized During Treatment: Gait belt Activity Tolerance: Patient tolerated treatment well Patient left: in bed;with call bell/phone within reach     Time: 1515-1530 PT Time Calculation (min) (ACUTE ONLY): 15 min  Charges:  $Gait Training: 8-22 mins                    G Codes:      Rica Koyanagi  PTA [facility]  Acute  Rehab Pager      [phone]

## 2015-01-16 LAB — BASIC METABOLIC PANEL
Anion gap: 9 (ref 5–15)
BUN: <5 mg/dL — ABNORMAL LOW (ref 6–20)
CO2: 31 mmol/L (ref 22–32)
Calcium: 8.1 mg/dL — ABNORMAL LOW (ref 8.9–10.3)
Chloride: 94 mmol/L — ABNORMAL LOW (ref 101–111)
Creatinine, Ser: 0.52 mg/dL (ref 0.44–1.00)
GFR calc Af Amer: >60 mL/min (ref >60)
GFR calc non Af Amer: >60 mL/min (ref >60)
Glucose, Bld: 154 mg/dL — ABNORMAL HIGH (ref 65–99)
Potassium: 3 mmol/L — ABNORMAL LOW (ref 3.5–5.1)
Sodium: 134 mmol/L — ABNORMAL LOW (ref 135–145)

## 2015-01-16 LAB — CBC
HCT: 26.3 % — ABNORMAL LOW (ref 36.0–46.0)
Hemoglobin: 8.7 g/dL — ABNORMAL LOW (ref 12.0–15.0)
MCH: 30 pg (ref 26.0–34.0)
MCHC: 33.1 g/dL (ref 30.0–36.0)
MCV: 90.7 fL (ref 78.0–100.0)
Platelets: 334 10*3/uL (ref 150–400)
RBC: 2.9 MIL/uL — ABNORMAL LOW (ref 3.87–5.11)
RDW: 14.9 % (ref 11.5–15.5)
WBC: 8.6 10*3/uL (ref 4.0–10.5)

## 2015-01-16 LAB — MAGNESIUM: Magnesium: 1.3 mg/dL — ABNORMAL LOW (ref 1.7–2.4)

## 2015-01-16 MED ORDER — OXYCODONE HCL 5 MG PO TABS
5.0000 mg | ORAL_TABLET | ORAL | Status: DC | PRN
  Administered 2015-01-17 (×2): 15 mg via ORAL
  Administered 2015-01-17: 10 mg via ORAL
  Administered 2015-01-17: 5 mg via ORAL
  Administered 2015-01-18 (×2): 15 mg via ORAL
  Filled 2015-01-16 (×2): qty 3
  Filled 2015-01-16: qty 1
  Filled 2015-01-16 (×2): qty 3
  Filled 2015-01-16: qty 2
  Filled 2015-01-16 (×2): qty 3

## 2015-01-16 MED ORDER — MAGNESIUM SULFATE 50 % IJ SOLN
3.0000 g | Freq: Once | INTRAVENOUS | Status: AC
  Administered 2015-01-16: 3 g via INTRAVENOUS
  Filled 2015-01-16: qty 6

## 2015-01-16 MED ORDER — POTASSIUM CHLORIDE IN NACL 40-0.9 MEQ/L-% IV SOLN
INTRAVENOUS | Status: DC
  Administered 2015-01-16 – 2015-01-17 (×3): 100 mL/h via INTRAVENOUS
  Administered 2015-01-18 – 2015-01-21 (×3): 50 mL/h via INTRAVENOUS
  Filled 2015-01-16 (×12): qty 1000

## 2015-01-16 MED ORDER — POTASSIUM CHLORIDE CRYS ER 20 MEQ PO TBCR
20.0000 meq | EXTENDED_RELEASE_TABLET | Freq: Three times a day (TID) | ORAL | Status: DC
Start: 2015-01-16 — End: 2015-01-19
  Administered 2015-01-16 – 2015-01-19 (×9): 20 meq via ORAL
  Filled 2015-01-16 (×14): qty 1

## 2015-01-16 NOTE — Consult Note (Addendum)
WOC wound follow up Wound type:Midline surgical and old colostomy site in LLQ. Wound with "pockets" and is assessed today with Modena Jansky, PA CCS.  Noted to have "false bottom" as cotton tipped applicator inserted into holes reveals 4.5cm defect/crevice.  Will discontinue NPWT for today and begin NS continually moist dressings with twice daily changes.  Mr. [name]/CCS MD will see in am and determine if and when to resume NPWT. Measurement: Midline: 15.5cm x 6.5cm x 4.5cm  Colostomy site:  3.5cm x 6.5cm x 2cm (Diminished depth since 10/2) Wound bed: Beefy red, non-granulating, most proximal tip is smooth with glossy appearance Drainage (amount, consistency, odor) No odor, moderate amount purulent exudate expressed initially.  Now serosanguinous. Periwound: intact Dressing procedure/placement/frequency:As noted above, NPWT is currently on hold and CCS will monitor for if and when to resume.  In the meantime, continually moist NS dressings will be started today. Supplies for NPWT at home have been delivered to the room. Case Manager aware of change in wound status.  WOC ostomy follow up Stoma type/location: RLQ Colostomy with pouching system applied on Sunday evening intact. Next change will be due later in week (Thursday or Friday). If NPWT reinitiated, may coordinate with that schedule. Stomal assessment/size:  Peristomal assessment: Not seen today Treatment options for stomal/peristomal skin: Skin barrier rings Output: soft brown stool.  Patient is independent in emptying and in self care although it is difficult for her at this time with abdominal wounds and JP drains x2. Ostomy pouching: 2pc. 2 and 3/4 inch pouching system with skin barrier ring.  4 set-ups (a two-week supply) is in room.    Education provided: None. Patient has no questions. Enrolled patient in [place] Start Discharge program: Yes  [place] nursing team will continue to follow, and will remain available to this patient,  the nursing, surgical and medical teams.   Thanks, Maudie Flakes, MSN, RN, [place], [place], [place] [phone])

## 2015-01-16 NOTE — Progress Notes (Cosign Needed)
7 Days Post-Op  Subjective: She looks good, wound vac is in place and we will take it down and look at it later today.  She has some stool in her ostomy and really wants some real food.  She is using the PCA every 15 min so we need to wean her from that.  Objective: Vital signs in last 24 hours: Temp:  [98.6 F (37 C)-100.3 F (37.9 C)] 99.9 F (37.7 C) (10/04 0456) Pulse Rate:  [82-86] 86 (10/04 0456) Resp:  [15-20] 20 (10/04 0456) BP: (96-114)/(45-70) 106/61 mmHg (10/04 0456) SpO2:  [96 %-100 %] 96 % (10/04 0456) Last BM Date: 01/15/15 480 PO recorded 240 from ostomy recorded 25 from the drain Still having low grade temp around 100 at 1900 hours each evening. K+ 3.0 I will check Mag CBC OK, H/H slowly drifting down Intake/Output from previous day: 10/03 0701 - 10/04 0700 In: 880 [P.O.:480; I.V.:300; IV Piggyback:100] Out: 1715 [Urine:1450; Drains:25; Stool:240] Intake/Output this shift:    General appearance: alert, cooperative and no distress Resp: clear to auscultation bilaterally Abd:  + BS, stool and gas in the ostomy, wound vac in place, drain is serous fluid.    Wound looked good on surface, but had some holes and this is what was below.  Lab Results:   Recent Labs  01/15/15 0825 01/16/15 0556  WBC 7.4 8.6  HGB 9.0* 8.7*  HCT 26.8* 26.3*  PLT 277 334    BMET  Recent Labs  01/15/15 0825 01/16/15 0556  NA 131* 134*  K 2.8* 3.0*  CL 93* 94*  CO2 29 31  GLUCOSE 140* 154*  BUN <5* <5*  CREATININE 0.50 0.52  CALCIUM 7.9* 8.1*   PT/INR No results for input(s): LABPROT, INR in the last 72 hours.   Recent Labs Lab 01/09/15 1822  AST 28  ALT 21  ALKPHOS 90  BILITOT 0.9  PROT 7.8  ALBUMIN 4.3     Lipase     Component Value Date/Time   LIPASE 33 02/03/2014 0638     Studies/Results: No results found.  Medications: . ciprofloxacin  400 mg Intravenous Q12H  . citalopram  20 mg Oral q morning - 10a  . enoxaparin (LOVENOX) injection  40  mg Subcutaneous Q24H  . gabapentin  1,200 mg Oral TID  . hydrochlorothiazide  25 mg Oral q morning - 10a  . HYDROmorphone PCA 0.3 mg/mL   Intravenous 6 times per day  . lisinopril  20 mg Oral Daily  . metronidazole  500 mg Intravenous Q8H  . pantoprazole (PROTONIX) IV  40 mg Intravenous QHS  . potassium chloride  10 mEq Intravenous Once  . potassium chloride  10 mEq Intravenous Once    Assessment/Plan Perforated rectosigmoid colon, s/p colonoscopy S/p Exploratory laparotomy, resection and revision of colostomy, appendectomy, omentectomy, repair incisional hernia and placement of pelvic drains, 01/10/15, Dr. 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  Start oral pain meds, continue PT, she is going home with her sister, when she is ready.  Wet to dry dressing for now.  See how it looks in a couple days.   Magnesium 1.3 will replace IV.  Prior to Admission medications   Medication  Sig Start Date End Date Taking? Authorizing Provider  ALPRAZolam Duanne Moron) 0.5 MG tablet Take 1 tablet (0.5 mg total) by mouth 2 (two) times daily as needed for anxiety or sleep. 11/13/14  Yes Rowe Clack, MD  amitriptyline (ELAVIL) 25 MG tablet Take 1 tablet (25 mg total) by mouth at bedtime as needed for sleep. 09/05/14  Yes Rowe Clack, MD  citalopram (CELEXA) 20 MG tablet Take 1 tablet (20 mg total) by mouth every morning. 09/05/14  Yes Rowe Clack, MD  gabapentin (NEURONTIN) 400 MG capsule Take 3 capsules (1,200 mg total) by mouth 3 (three) times daily. 09/05/14  Yes Rowe Clack, MD  hydrochlorothiazide (HYDRODIURIL) 25 MG tablet Take 25 mg by mouth every morning.   Yes  Historical Provider, MD  lisinopril (PRINIVIL,ZESTRIL) 20 MG tablet Take 1 tablet (20 mg total) by mouth daily. When BP averages > 140/90 05/23/14  Yes Rowe Clack, MD  naproxen sodium (ANAPROX) 220 MG tablet Take 440 mg by mouth 2 (two) times daily as needed (pain).   Yes Historical Provider, MD  oxyCODONE-acetaminophen (PERCOCET/ROXICET) 5-325 MG per tablet Take 1 tablet by mouth every 6 (six) hours as needed for severe pain. 12/07/14  Yes Domenic Moras, PA-C         LOS: 6 days    Nekia Maxham 01/16/2015

## 2015-01-16 NOTE — Care Management Important Message (Signed)
Important Message  Patient Details  Name: [name] MRN: [id number] Date of Birth: April 02, 1956   Medicare Important Message Given:  Yes-third notification given    Camillo Flaming 01/16/2015, 1:31 PMImportant Message  Patient Details  Name: [name] MRN: [id number] Date of Birth: May 04, 1955   Medicare Important Message Given:  Yes-third notification given    Camillo Flaming 01/16/2015, 1:30 PM

## 2015-01-17 LAB — CULTURE, BLOOD (ROUTINE X 2)
Culture: NO GROWTH
Culture: NO GROWTH

## 2015-01-17 LAB — BASIC METABOLIC PANEL
Anion gap: 7 (ref 5–15)
BUN: 5 mg/dL — ABNORMAL LOW (ref 6–20)
CO2: 29 mmol/L (ref 22–32)
Calcium: 8.3 mg/dL — ABNORMAL LOW (ref 8.9–10.3)
Chloride: 100 mmol/L — ABNORMAL LOW (ref 101–111)
Creatinine, Ser: 0.54 mg/dL (ref 0.44–1.00)
GFR calc Af Amer: >60 mL/min (ref >60)
GFR calc non Af Amer: >60 mL/min (ref >60)
Glucose, Bld: 140 mg/dL — ABNORMAL HIGH (ref 65–99)
Potassium: 4.1 mmol/L (ref 3.5–5.1)
Sodium: 136 mmol/L (ref 135–145)

## 2015-01-17 LAB — MAGNESIUM: Magnesium: 1.7 mg/dL (ref 1.7–2.4)

## 2015-01-17 MED ORDER — HYDROMORPHONE HCL 1 MG/ML IJ SOLN
0.5000 mg | INTRAMUSCULAR | Status: DC | PRN
  Administered 2015-01-17: 1 mg via INTRAVENOUS
  Administered 2015-01-17: 0.5 mg via INTRAVENOUS
  Administered 2015-01-18 – 2015-01-21 (×3): 1 mg via INTRAVENOUS
  Filled 2015-01-17 (×5): qty 1

## 2015-01-17 NOTE — Progress Notes (Signed)
8 Days Post-Op  Subjective: She looks fine, ate real food and did well.  The ostomy is working well also.  Objective: Vital signs in last 24 hours: Temp:  [98.8 F (37.1 C)-100 F (37.8 C)] 98.9 F (37.2 C) (10/05 0500) Pulse Rate:  [81-88] 81 (10/05 0500) Resp:  [12-21] 20 (10/05 0748) BP: (104-117)/(64-75) 117/69 mmHg (10/05 0500) SpO2:  [93 %-100 %] 99 % (10/05 0748) Last BM Date: 01/16/15 240 PO recorded 600 from the ostomy Drain 25 TM 100 K+ up to 4.1, mag 1.7 Intake/Output from previous day: 10/04 0701 - 10/05 0700 In: 3528.3 [P.O.:240; I.V.:1788.3; IV Piggyback:1500] Out: 3125 [Urine:2500; Drains:25; XBDZH:299] Intake/Output this shift:    General appearance: alert, cooperative and no distress Resp: clear to auscultation bilaterally GI: open abdomen is OK, not ready for wound vac, sutures are loose at the base.  ostomy is working well, old ostomy site looks good.  Lab Results:   Recent Labs  01/15/15 0825 01/16/15 0556  WBC 7.4 8.6  HGB 9.0* 8.7*  HCT 26.8* 26.3*  PLT 277 334    BMET  Recent Labs  01/16/15 0556 01/17/15 0520  NA 134* 136  K 3.0* 4.1  CL 94* 100*  CO2 31 29  GLUCOSE 154* 140*  BUN <5* 5*  CREATININE 0.52 0.54  CALCIUM 8.1* 8.3*   PT/INR No results for input(s): LABPROT, INR in the last 72 hours.  No results for input(s): AST, ALT, ALKPHOS, BILITOT, PROT, ALBUMIN in the last 168 hours.   Lipase     Component Value Date/Time   LIPASE 33 02/03/2014 0638     Studies/Results: No results found.  Medications: . ciprofloxacin  400 mg Intravenous Q12H  . citalopram  20 mg Oral q morning - 10a  . enoxaparin (LOVENOX) injection  40 mg Subcutaneous Q24H  . gabapentin  1,200 mg Oral TID  . hydrochlorothiazide  25 mg Oral q morning - 10a  . HYDROmorphone PCA 0.3 mg/mL   Intravenous 6 times per day  . lisinopril  20 mg Oral Daily  . metronidazole  500 mg Intravenous Q8H  . pantoprazole (PROTONIX) IV  40 mg Intravenous QHS  .  potassium chloride  20 mEq Oral TID    Assessment/Plan Perforated rectosigmoid colon, s/p colonoscopy S/p Exploratory laparotomy, resection and revision of colostomy, appendectomy, omentectomy, repair incisional hernia and placement of pelvic drains, 01/10/15, Dr. 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  So I am stopping PCA, she will have oral and IV pain meds.  I will get Case management to see.  Pt thinks Home health will do BID dressing changes.  It does not sound like family is willing to assist much.  I don't know if she can do them on her own.  I will check on antibiotic duration and change PPI to PO also.  Aim to get her home in the next 24-48 hours.     LOS: 7 days    Assata Juncaj 01/17/2015

## 2015-01-17 NOTE — Plan of Care (Signed)
Problem: Phase III Progression Outcomes Goal: Pain controlled on oral analgesia Outcome: Not Progressing Pt on PCA.

## 2015-01-17 NOTE — Progress Notes (Signed)
Occupational Therapy Discharge Patient Details Name: [name] MRN: [id number] DOB: 1955-12-26 Today's Date: 01/17/2015    History of present illness 59 yo female with history  of rectal cancer, colostomy. admitted following routine colonoscopy. Admitted  for  9//27 Procedure(s): Exploratory laparotomy, resection and revision of colostomy, appendectomy, omentectomy, repair incisional hernia and placement of pelvic drains   OT comments  OT goals met  Follow Up Recommendations  No OT follow up          Precautions / Restrictions Precautions Precautions: None       Mobility Bed Mobility Overal bed mobility: Modified Independent             General bed mobility comments: extra time for pain but able without assist  Transfers Overall transfer level: Needs assistance Equipment used: None   Sit to Stand: Modified independent (Device/Increase time)              Balance                                   ADL Overall ADL's : At baseline                                       General ADL Comments: all goals met                            Progress Toward Goals  OT Goals(current goals can now be found in the care plan section)  Progress towards OT goals: Goals met/education completed, patient discharged from [place] All goals met and education completed, patient discharged from OT services    Co-evaluation                 End of Session     Activity Tolerance Patient tolerated treatment well   Patient Left in bed   Nurse Communication          Time: 0208-9100 OT Time Calculation (min): 23 min  Charges: OT General Charges $OT Visit: 1 Procedure OT Treatments $Self Care/Home Management : 23-37 mins  [name], [name] 01/17/2015, 10:54 AM

## 2015-01-18 MED ORDER — OXYCODONE HCL 5 MG PO TABS
10.0000 mg | ORAL_TABLET | ORAL | Status: DC | PRN
  Administered 2015-01-18: 20 mg via ORAL
  Administered 2015-01-18: 15 mg via ORAL
  Administered 2015-01-18 – 2015-01-19 (×4): 20 mg via ORAL
  Administered 2015-01-19 (×2): 10 mg via ORAL
  Administered 2015-01-19 – 2015-01-22 (×17): 20 mg via ORAL
  Filled 2015-01-18 (×18): qty 4
  Filled 2015-01-18: qty 2
  Filled 2015-01-18 (×5): qty 4

## 2015-01-18 NOTE — Care Management Note (Addendum)
Case Management Note  Patient Details  Name: [name] MRN: [id number] Date of Birth: [birth year]  Subjective/Objective:                   Exploratory laparotomy, resection and revision of colostomy, appendectomy, omentectomy, repair incisional hernia and placement of pelvic drains Action/Plan:  Discharge planning Expected Discharge Date:  01/19/15               Expected Discharge Plan:  [place]  In-House Referral:     Discharge planning Services  CM Consult  Post Acute Care Choice:  Home Health Choice offered to:  Patient  DME Arranged:    DME Agency:  [place] Arranged:  RN, Nurse's Aide [place] Agency:  [place]  Status of Service:  Completed, signed off  Medicare Important Message Given:  Yes-third notification given Date Medicare IM Given:    Medicare IM give by:    Date Additional Medicare IM Given:    Additional Medicare Important Message give by:     If discussed at [place] of Stay Meetings, dates discussed:    Additional Comments: CM notes change in home health services.  Pt will be receiving HHRN for dressing changes and aide.  CM called [facility] to notify rep of changes (no wound vac) and now BID W-D dressing changes.  Orders have been placed and F2F is in EPIC.  No other CM needs were communicated. Dellie Catholic, RN 01/18/2015, 11:31 AM

## 2015-01-18 NOTE — Progress Notes (Signed)
I have continued to see the patient this week. She is slowly improving. Tolerating a regular diet. She wishes to go home soon, transitioned to oral pain medications. I appreciate the excellent surgical care she has received. Please contact me if there is anything I help with moving forward.   [place] Cellar, MD [facility] Pager [phone]

## 2015-01-18 NOTE — Progress Notes (Signed)
9 Days Post-Op  Subjective: In bed crying because of pain.  She says she has had pain since they took PCA away.  Increased serous drainage from the open wound.  Otherwise she seems to be doing well.  Objective: Vital signs in last 24 hours: Temp:  [98.6 F (37 C)-101.5 F (38.6 C)] 99.1 F (37.3 C) (10/06 0500) Pulse Rate:  [85-102] 85 (10/06 0500) Resp:  [17-20] 18 (10/06 0500) BP: (105-125)/(56-73) 115/69 mmHg (10/06 0500) SpO2:  [87 %-100 %] 100 % (10/06 0500) Last BM Date: 01/16/15 240 PO 1525 urine 855 from ostomy 50 ml total from both drains yesterday Tm 100.5,  K+ up to 4.1 and magnesium 1.7 No film Intake/Output from previous day: 10/05 0701 - 10/06 0700 In: 1759.2 [P.O.:240; I.V.:1519.2] Out: 2430 [Urine:1525; Drains:50; Stool:855] Intake/Output this shift:    General appearance: alert, cooperative and no distress.  Crying with pain in room, but she moves in bed fairly well. Resp: clear to auscultation bilaterally GI: soft, complaining of pain, Open wound is clean, she has serous drainage from the JP's.  Good stool output thru the ostomy.  Lab Results:   Recent Labs  01/16/15 0556  WBC 8.6  HGB 8.7*  HCT 26.3*  PLT 334    BMET  Recent Labs  01/16/15 0556 01/17/15 0520  NA 134* 136  K 3.0* 4.1  CL 94* 100*  CO2 31 29  GLUCOSE 154* 140*  BUN <5* 5*  CREATININE 0.52 0.54  CALCIUM 8.1* 8.3*   PT/INR No results for input(s): LABPROT, INR in the last 72 hours.  No results for input(s): AST, ALT, ALKPHOS, BILITOT, PROT, ALBUMIN in the last 168 hours.   Lipase     Component Value Date/Time   LIPASE 33 02/03/2014 0638     Studies/Results: No results found.  Medications: . ciprofloxacin  400 mg Intravenous Q12H  . citalopram  20 mg Oral q morning - 10a  . enoxaparin (LOVENOX) injection  40 mg Subcutaneous Q24H  . gabapentin  1,200 mg Oral TID  . hydrochlorothiazide  25 mg Oral q morning - 10a  . lisinopril  20 mg Oral Daily  .  metronidazole  500 mg Intravenous Q8H  . pantoprazole (PROTONIX) IV  40 mg Intravenous QHS  . potassium chloride  20 mEq Oral TID    Assessment/Plan Perforated rectosigmoid colon, s/p colonoscopy S/p Exploratory laparotomy, resection and revision of colostomy, appendectomy, omentectomy, repair incisional hernia and placement of pelvic drains, 01/10/15, Dr. 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  She is draining more serous fluid from the midline wound.  She may have some fascial separation midline site.  Pain is an issue.  I increased her PO oxycodone and have her on tylenol qid.  We will see holw she does, but we would like to let her go home tomorrow.  I have ask Case management to see and filled out face to face.  Recheck labs in AM.   LOS: 8 days    [name] 01/18/2015

## 2015-01-18 NOTE — Progress Notes (Signed)
Will Creig Hines, PA aware via phone that Rt JP (#2) drain recently pulled out when pt up to BR. Small amount of serosanguineous drainage noted otherwise drain site WNL with surrounding tissue intact. Dry gauze applied to area. Pt stated "I didn't even know it came." No c/o pain or discomfort at site. No new orders received.

## 2015-01-19 LAB — COMPREHENSIVE METABOLIC PANEL
ALT: 8 U/L — ABNORMAL LOW (ref 14–54)
AST: 14 U/L — ABNORMAL LOW (ref 15–41)
Albumin: 2.5 g/dL — ABNORMAL LOW (ref 3.5–5.0)
Alkaline Phosphatase: 75 U/L (ref 38–126)
Anion gap: 8 (ref 5–15)
BUN: 5 mg/dL — ABNORMAL LOW (ref 6–20)
CO2: 24 mmol/L (ref 22–32)
Calcium: 8.1 mg/dL — ABNORMAL LOW (ref 8.9–10.3)
Chloride: 96 mmol/L — ABNORMAL LOW (ref 101–111)
Creatinine, Ser: 0.7 mg/dL (ref 0.44–1.00)
GFR calc Af Amer: >60 mL/min (ref >60)
GFR calc non Af Amer: >60 mL/min (ref >60)
Glucose, Bld: 120 mg/dL — ABNORMAL HIGH (ref 65–99)
Potassium: 4.5 mmol/L (ref 3.5–5.1)
Sodium: 128 mmol/L — ABNORMAL LOW (ref 135–145)
Total Bilirubin: 1.1 mg/dL (ref 0.3–1.2)
Total Protein: 5.9 g/dL — ABNORMAL LOW (ref 6.5–8.1)

## 2015-01-19 LAB — CBC
HCT: 25.6 % — ABNORMAL LOW (ref 36.0–46.0)
Hemoglobin: 8.5 g/dL — ABNORMAL LOW (ref 12.0–15.0)
MCH: 30.2 pg (ref 26.0–34.0)
MCHC: 33.2 g/dL (ref 30.0–36.0)
MCV: 91.1 fL (ref 78.0–100.0)
Platelets: 633 10*3/uL — ABNORMAL HIGH (ref 150–400)
RBC: 2.81 MIL/uL — ABNORMAL LOW (ref 3.87–5.11)
RDW: 15.1 % (ref 11.5–15.5)
WBC: 10.7 10*3/uL — ABNORMAL HIGH (ref 4.0–10.5)

## 2015-01-19 LAB — MAGNESIUM: Magnesium: 1.2 mg/dL — ABNORMAL LOW (ref 1.7–2.4)

## 2015-01-19 MED ORDER — CIPROFLOXACIN HCL 500 MG PO TABS
500.0000 mg | ORAL_TABLET | Freq: Two times a day (BID) | ORAL | Status: DC
  Administered 2015-01-19 – 2015-01-22 (×6): 500 mg via ORAL
  Filled 2015-01-19 (×8): qty 1

## 2015-01-19 MED ORDER — METRONIDAZOLE 500 MG PO TABS
500.0000 mg | ORAL_TABLET | Freq: Three times a day (TID) | ORAL | Status: DC
  Administered 2015-01-19 – 2015-01-22 (×9): 500 mg via ORAL
  Filled 2015-01-19 (×12): qty 1

## 2015-01-19 MED ORDER — PANTOPRAZOLE SODIUM 40 MG PO TBEC
40.0000 mg | DELAYED_RELEASE_TABLET | Freq: Every day | ORAL | Status: DC
  Administered 2015-01-19 – 2015-01-22 (×4): 40 mg via ORAL
  Filled 2015-01-19 (×4): qty 1

## 2015-01-19 MED ORDER — MAGNESIUM SULFATE 2 GM/50ML IV SOLN
2.0000 g | Freq: Once | INTRAVENOUS | Status: AC
  Administered 2015-01-19: 2 g via INTRAVENOUS
  Filled 2015-01-19: qty 50

## 2015-01-19 MED ORDER — BOOST / RESOURCE BREEZE PO LIQD
1.0000 | Freq: Three times a day (TID) | ORAL | Status: DC
  Administered 2015-01-19 – 2015-01-22 (×9): 1 via ORAL

## 2015-01-19 NOTE — Consult Note (Signed)
WOC ostomy follow up Stoma type/location: RLQ Colostomy Stomal assessment/size: Oval, 1 and 1/4 inches x 1 and 5/8 inches, pink, patent and producing brown stool.  Peristomal assessment: Intact  Midline abdominal surgical wound managed by bedside nurse.  Treatment options for stomal/peristomal skin: Barrier ring for improved wear time and seal.  Output Loose brown stool Ostomy pouching: 2pc. 2 3/4" pouch with barrier ring  Education provided: Patient has had a stoma before.  Understands the principles of self care and is in agreement with product selection.  Able to cut oval stoma using pattern.  Enrolled patient in [place] Start Discharge program: Yes Supplies for discharge remain above sink.  [place] team will continue to follow.  Domenic Moras RN BSN [place] Pager [phone]

## 2015-01-19 NOTE — Progress Notes (Signed)
Physical Therapy Treatment Patient Details Name: [name] MRN: [id number] DOB: 16-Dec-1955 Today's Date: 01/19/2015    History of Present Illness 59 yo female with history  of rectal cancer, colostomy. admitted following routine colonoscopy. Admitted  for  9//27 Procedure(s): Exploratory laparotomy, resection and revision of colostomy, appendectomy, omentectomy, repair incisional hernia and placement of pelvic drains    PT Comments    Assisted pt OOB to [facility].  Pt present with uncontrolled loose/watery/Jevity Like substance oozing from her rectum.  Pt states, "it's been like this all day".  BSC already full with same substance. Pt was unable to amb in hallway because she continued to ooze out and down her legs.  Assisted with clean up twice. Reported to RN.    Follow Up Recommendations  No PT follow up     Equipment Recommendations  None recommended by PT    Recommendations for Other Services       Precautions / Restrictions Precautions Precautions: None Precaution Comments: colostomy, r drain Restrictions Weight Bearing Restrictions: No    Mobility  Bed Mobility Overal bed mobility: Modified Independent             General bed mobility comments: extra time due to pain but able without assist  Transfers Overall transfer level: Needs assistance Equipment used: None Transfers: Sit to/from Stand Sit to Stand: Modified independent (Device/Increase time)         General transfer comment: good safety cognition and use of hands to staedy self  Ambulation/Gait Ambulation/Gait assistance: Supervision Ambulation Distance (Feet): 14 Feet Assistive device: None Gait Pattern/deviations: Step-through pattern;Decreased stride length     General Gait Details: decreased amb due to max amount of watery/Jevity like stools leaking from pt's rectum with no controll.     Stairs            Wheelchair Mobility    Modified Rankin (Stroke Patients Only)        Balance                                    Cognition Arousal/Alertness: Awake/alert Behavior During Therapy: WFL for tasks assessed/performed Overall Cognitive Status: Within Functional Limits for tasks assessed                      Exercises      General Comments        Pertinent Vitals/Pain Pain Assessment: Faces Faces Pain Scale: Hurts a little bit Pain Location: ABD Pain Descriptors / Indicators: Sore;Tightness Pain Intervention(s): Monitored during session;Repositioned    Home Living                      Prior Function            PT Goals (current goals can now be found in the care plan section) Progress towards PT goals: Progressing toward goals    Frequency  Min 3X/week    PT Plan Current plan remains appropriate    Co-evaluation             End of Session Equipment Utilized During Treatment: Gait belt Activity Tolerance: Treatment limited secondary to medical complications (Comment) Patient left: in bed;with call bell/phone within reach     Time: 1310-1340 PT Time Calculation (min) (ACUTE ONLY): 30 min  Charges:  $Gait Training: 8-22 mins $Therapeutic Activity: 8-22 mins  G Codes:      Rica Koyanagi  PTA [facility]  Acute  Rehab Pager      [phone]

## 2015-01-19 NOTE — Progress Notes (Signed)
Notified PA Saverio Danker that patient is leaking stool from rectum, will continue to monitor patient Neta Mends RN 1:45 PM 01-19-2015

## 2015-01-19 NOTE — Progress Notes (Signed)
Patient ID: [name], female   DOB: 1955/12/19, 59 y.o.   MRN: [id number] 10 Days Post-Op  Subjective: Pt not eating much at all.  Gets hungry and once food arrives, she loses her appetite.  Having to take 20mg  of oxyIR to control pain.  Objective: Vital signs in last 24 hours: Temp:  [98.5 F (36.9 C)-99.9 F (37.7 C)] 99.9 F (37.7 C) (10/07 0557) Pulse Rate:  [92-95] 95 (10/07 0557) Resp:  [15-18] 18 (10/07 0557) BP: (108-121)/(57-67) 121/67 mmHg (10/07 0557) SpO2:  [97 %-98 %] 98 % (10/07 0557) Last BM Date: 01/17/15  Intake/Output from previous day: 10/06 0701 - 10/07 0700 In: 2258.1 [P.O.:1200; I.V.:858.1; IV Piggyback:200] Out: 5020 [Urine:3700; Drains:20; GBTDV:7616] Intake/Output this shift:    PE: Abd: soft, ostomy is working well, one JP drain came out, other JP with some serosang output, midline wound with fascial separation of the superior part of the wound, with visible omentum or bowel, but this is well scarred in and does not look at risk for evisceration. Heart: regular LungS: CTAB  Lab Results:   Recent Labs  01/19/15 0500  WBC 10.7*  HGB 8.5*  HCT 25.6*  PLT 633*   BMET  Recent Labs  01/17/15 0520 01/19/15 0500  NA 136 128*  K 4.1 4.5  CL 100* 96*  CO2 29 24  GLUCOSE 140* 120*  BUN 5* 5*  CREATININE 0.54 0.70  CALCIUM 8.3* 8.1*   PT/INR No results for input(s): LABPROT, INR in the last 72 hours. CMP     Component Value Date/Time   NA 128* 01/19/2015 0500   NA 142 09/09/2013 0809   K 4.5 01/19/2015 0500   K 4.1 09/09/2013 0809   CL 96* 01/19/2015 0500   CL 108* 09/14/2012 1453   CO2 24 01/19/2015 0500   CO2 21* 09/09/2013 0809   GLUCOSE 120* 01/19/2015 0500   GLUCOSE 98 09/09/2013 0809   GLUCOSE 106* 09/14/2012 1453   BUN 5* 01/19/2015 0500   BUN 18.2 09/09/2013 0809   CREATININE 0.70 01/19/2015 0500   CREATININE 0.8 09/09/2013 0809   CALCIUM 8.1* 01/19/2015 0500   CALCIUM 9.4 09/09/2013 0809   PROT 5.9* 01/19/2015 0500    PROT 7.0 09/09/2013 0809   ALBUMIN 2.5* 01/19/2015 0500   ALBUMIN 3.7 09/09/2013 0809   AST 14* 01/19/2015 0500   AST 26 09/09/2013 0809   ALT 8* 01/19/2015 0500   ALT 28 09/09/2013 0809   ALKPHOS 75 01/19/2015 0500   ALKPHOS 95 09/09/2013 0809   BILITOT 1.1 01/19/2015 0500   BILITOT 0.48 09/09/2013 0809   GFRNONAA >60 01/19/2015 0500   GFRAA >60 01/19/2015 0500   Lipase     Component Value Date/Time   LIPASE 33 02/03/2014 0638       Studies/Results: No results found.  Anti-infectives: Anti-infectives    Start     Dose/Rate Route Frequency Ordered Stop   01/19/15 1400  metroNIDAZOLE (FLAGYL) tablet 500 mg     500 mg Oral 3 times per day 01/19/15 1134     01/19/15 1145  ciprofloxacin (CIPRO) tablet 500 mg     500 mg Oral 2 times daily 01/19/15 1134     01/09/15 2015  ciprofloxacin (CIPRO) IVPB 400 mg  Status:  Discontinued     400 mg 200 mL/hr over 60 Minutes Intravenous Every 12 hours 01/09/15 2013 01/19/15 1134   01/09/15 2015  metroNIDAZOLE (FLAGYL) IVPB 500 mg  Status:  Discontinued     500  mg 100 mL/hr over 60 Minutes Intravenous Every 8 hours 01/09/15 2013 01/19/15 1134       Assessment/Plan  POD 10,  S/p Exploratory laparotomy, resection and revision of colostomy, appendectomy, omentectomy, repair incisional hernia and placement of pelvic drains, 01/10/15, Dr. Excell Seltzer -cont BID dressing change to midline abdominal wound.  She does have fascial separation.  Will follow this closely, but this is scarred in well and does not appear to be at risk for evisceration -not eating much, will try resource breeze to add caloric intake -cont to mobilize and pulm toilet -switch C/F to oral.  Will need 2 weeks total. D10/14 -follow up with Dr. Excell Seltzer as outpatient Thrombocytosis -new onset today.  Platelets have doubled from 334 to 633 in 2 days.  Will follow.  Likely reactive, but had not been increasing that significantly before the last two days. DVT  prophylaxis -SCDs/Lovenox Dispo -follow wound and would like to get patient's intake up some prior to discharge.  LOS: 9 days    Prentis Langdon E 01/19/2015, 11:36 AM Pager: [phone]

## 2015-01-19 NOTE — Care Management Important Message (Signed)
Important Message  Patient Details  Name: [name] MRN: [id number] Date of Birth: 1955-06-10   Medicare Important Message Given:  Yes-fourth notification given    Camillo Flaming 01/19/2015, 11:44 AMImportant Message  Patient Details  Name: [name] MRN: [id number] Date of Birth: Nov 02, 1955   Medicare Important Message Given:  Yes-fourth notification given    Camillo Flaming 01/19/2015, 11:44 AM

## 2015-01-19 NOTE — Discharge Instructions (Signed)
[facility]      [facility] °[phone] ° °OPEN ABDOMINAL SURGERY: POST OP INSTRUCTIONS ° °Always review your discharge instruction sheet given to you by the facility where your surgery was performed. ° °IF YOU HAVE DISABILITY OR FAMILY LEAVE FORMS, YOU MUST BRING THEM TO THE OFFICE FOR PROCESSING.  PLEASE DO NOT GIVE THEM TO YOUR DOCTOR. ° °1. A prescription for pain medication may be given to you upon discharge.  Take your pain medication as prescribed, if needed.  If narcotic pain medicine is not needed, then you may take acetaminophen (Tylenol) or ibuprofen (Advil) as needed. °2. Take your usually prescribed medications unless otherwise directed. °3. If you need a refill on your pain medication, please contact your pharmacy. They will contact our office to request authorization.  Prescriptions will not be filled after 5pm or on week-ends. °4. You should follow a light diet the first few days after arrival home, such as soup and crackers, pudding, etc.unless your doctor has advised otherwise. A high-fiber, low fat diet can be resumed as tolerated.   Be sure to include lots of fluids daily. Most patients will experience some swelling and bruising on the chest and neck area.  Ice packs will help.  Swelling and bruising can take several days to resolve °5. Most patients will experience some swelling and bruising in the area of the incision. Ice pack will help. Swelling and bruising can take several days to resolve..  °6. It is common to experience some constipation if taking pain medication after surgery.  Increasing fluid intake and taking a stool softener will usually help or prevent this problem from occurring.  A mild laxative (Milk of Magnesia or Miralax) should be taken according to package directions if there are no bowel movements after 48 hours. °7.  You may have steri-strips (small skin tapes) in place directly over the incision.  These strips should be left on the skin for 7-10 days.  If your  surgeon used skin glue on the incision, you may shower in 24 hours.  The glue will flake off over the next 2-3 weeks.  Any sutures or staples will be removed at the office during your follow-up visit. You may find that a light gauze bandage over your incision may keep your staples from being rubbed or pulled. You may shower and replace the bandage daily. °8. ACTIVITIES:  You may resume regular (light) daily activities beginning the next day--such as daily self-care, walking, climbing stairs--gradually increasing activities as tolerated.  You may have sexual intercourse when it is comfortable.  Refrain from any heavy lifting or straining until approved by your doctor. °a. You may drive when you no longer are taking prescription pain medication, you can comfortably wear a seatbelt, and you can safely maneuver your car and apply brakes °b. Return to Work: ___________________________________ °9. You should see your doctor in the office for a follow-up appointment approximately two weeks after your surgery.  Make sure that you call for this appointment within a day or two after you arrive home to insure a convenient appointment time. °OTHER INSTRUCTIONS:  °_____________________________________________________________ °_____________________________________________________________ ° °WHEN TO CALL YOUR DOCTOR: °1. Fever over 101.0 °2. Inability to urinate °3. Nausea and/or vomiting °4. Extreme swelling or bruising °5. Continued bleeding from incision. °6. Increased pain, redness, or drainage from the incision. °7. Difficulty swallowing or breathing °8. Muscle cramping or spasms. °9. Numbness or tingling in hands or feet or around lips. ° °The clinic staff is available to   answer your questions during regular business hours.  Please dont hesitate to call and ask to speak to one of the nurses if you have concerns.  For further questions, please visit [email or web address]  Dressing Change A dressing is a  material placed over wounds. It keeps the wound clean, dry, and protected from further injury. This provides an environment that favors wound healing.  BEFORE YOU BEGIN Get your supplies together. Things you may need include: Saline solution. Flexible gauze dressing. Medicated cream. Tape. Gloves. Abdominal dressing pads. Gauze squares. Plastic bags. Take pain medicine 30 minutes before the dressing change if you need it. Take a shower before you do the first dressing change of the day. Use plastic wrap or a plastic bag to prevent the dressing from getting wet. REMOVING YOUR OLD DRESSING  Wash your hands with soap and water. Dry your hands with a clean towel. Put on your gloves. Remove any tape. Carefully remove the old dressing. If the dressing sticks, you may dampen it with warm water to loosen it, or follow your caregiver's specific directions. Remove any gauze or packing tape that is in your wound. Take off your gloves. Put the gloves, tape, gauze, or any packing tape into a plastic bag. CHANGING YOUR DRESSING Open the supplies. Take the cap off the saline solution. Open the gauze package so that the gauze remains on the inside of the package. Put on your gloves. Clean your wound as told by your caregiver. If you have been told to keep your wound dry, follow those instructions. Your caregiver may tell you to do one or more of the following: Pick up the gauze. Pour the saline solution over the gauze. Squeeze out the extra saline solution. Put medicated cream or other medicine on your wound if you have been told to do so. Put the solution soaked gauze only in your wound, not on the skin around it. Pack your wound loosely or as told by your caregiver. Put dry gauze on your wound. Put abdominal dressing pads over the dry gauze if your wet gauze soaks through. Tape the abdominal dressing pads in place so they will not fall off. Do not wrap the tape completely around the affected  part (arm, leg, abdomen). Wrap the dressing pads with a flexible gauze dressing to secure it in place. Take off your gloves. Put them in the plastic bag with the old dressing. Tie the bag shut and throw it away. Keep the dressing clean and dry until your next dressing change. Wash your hands. SEEK MEDICAL CARE IF: Your skin around the wound looks red. Your wound feels more tender or sore. You see pus in the wound. Your wound smells bad. You have a fever. Your skin around the wound has a rash that itches and burns. You see black or yellow skin in your wound that was not there before. You feel nauseous, throw up, and feel very tired.   This information is not intended to replace advice given to you by your health care provider. Make sure you discuss any questions you have with your health care provider.   Document Released: 05/08/2004 Document Revised: 06/23/2011 Document Reviewed: 02/10/2011 Elsevier Interactive Patient Education [employer].

## 2015-01-20 LAB — BASIC METABOLIC PANEL
Anion gap: 6 (ref 5–15)
BUN: 6 mg/dL (ref 6–20)
CO2: 28 mmol/L (ref 22–32)
Calcium: 8.4 mg/dL — ABNORMAL LOW (ref 8.9–10.3)
Chloride: 97 mmol/L — ABNORMAL LOW (ref 101–111)
Creatinine, Ser: 0.67 mg/dL (ref 0.44–1.00)
GFR calc Af Amer: >60 mL/min (ref >60)
GFR calc non Af Amer: >60 mL/min (ref >60)
Glucose, Bld: 168 mg/dL — ABNORMAL HIGH (ref 65–99)
Potassium: 4.4 mmol/L (ref 3.5–5.1)
Sodium: 131 mmol/L — ABNORMAL LOW (ref 135–145)

## 2015-01-20 LAB — CBC
HCT: 24.4 % — ABNORMAL LOW (ref 36.0–46.0)
Hemoglobin: 8.2 g/dL — ABNORMAL LOW (ref 12.0–15.0)
MCH: 30.5 pg (ref 26.0–34.0)
MCHC: 33.6 g/dL (ref 30.0–36.0)
MCV: 90.7 fL (ref 78.0–100.0)
Platelets: 660 10*3/uL — ABNORMAL HIGH (ref 150–400)
RBC: 2.69 MIL/uL — ABNORMAL LOW (ref 3.87–5.11)
RDW: 15.2 % (ref 11.5–15.5)
WBC: 9.5 10*3/uL (ref 4.0–10.5)

## 2015-01-20 NOTE — Progress Notes (Signed)
Patient ID: [name], female   DOB: [birth year], 59 y.o.   MRN: [id number] 11 Days Post-Op  Subjective: Feeling steadily better. Good pain control. She is concerned that she had a large bowel movement per rectum yesterday and has had a couple of others while she's been here (I was not aware of these). No bleeding.  Objective: Vital signs in last 24 hours: Temp:  [99 F (37.2 C)-101 F (38.3 C)] 99.8 F (37.7 C) (10/08 0958) Pulse Rate:  [88-97] 93 (10/08 0958) Resp:  [16-18] 18 (10/08 0958) BP: (100-109)/(55-70) 100/62 mmHg (10/08 0958) SpO2:  [96 %-100 %] 100 % (10/08 0958) Last BM Date: 01/20/15  Intake/Output from previous day: 10/07 0701 - 10/08 0700 In: 2179.2 [P.O.:600; I.V.:1579.2] Out: 1800 [Urine:1800] Intake/Output this shift: Total I/O In: 213.3 [I.V.:213.3] Out: 500 [Urine:400; Stool:100]  General appearance: alert, cooperative and no distress GI: soft and nondistended without any significant tenderness Incision/Wound: midline wound packed, dressing clean and dry, not reexamined today  Lab Results:   Recent Labs  01/19/15 0500 01/20/15 0045  WBC 10.7* 9.5  HGB 8.5* 8.2*  HCT 25.6* 24.4*  PLT 633* 660*   BMET  Recent Labs  01/19/15 0500 01/20/15 0045  NA 128* 131*  K 4.5 4.4  CL 96* 97*  CO2 24 28  GLUCOSE 120* 168*  BUN 5* 6  CREATININE 0.70 0.67  CALCIUM 8.1* 8.4*     Studies/Results: No results found.  Anti-infectives: Anti-infectives    Start     Dose/Rate Route Frequency Ordered Stop   01/19/15 2000  ciprofloxacin (CIPRO) tablet 500 mg     500 mg Oral 2 times daily 01/19/15 1134     01/19/15 1400  metroNIDAZOLE (FLAGYL) tablet 500 mg     500 mg Oral 3 times per day 01/19/15 1134     01/09/15 2015  ciprofloxacin (CIPRO) IVPB 400 mg  Status:  Discontinued     400 mg 200 mL/hr over 60 Minutes Intravenous Every 12 hours 01/09/15 2013 01/19/15 1134   01/09/15 2015  metroNIDAZOLE (FLAGYL) IVPB 500 mg  Status:  Discontinued     500  mg 100 mL/hr over 60 Minutes Intravenous Every 8 hours 01/09/15 2013 01/19/15 1134      Assessment/Plan: s/p Procedure(s): EXPLORATORY LAPAROTOMY APPENDECTOMY Perforation post colonoscopy, 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 Drains are out. White count is now normal but she did have 1 episode of fever to 101 yesterday. Abdomen seems fine. Continue oral antibiotics. Not sure why she seems to be having more material passing per rectum. I cannot think of any likely cause of the fistula. Monitor for now. Working toward discharge in a couple of days.   LOS: 10 days    Emrik Erhard T 01/20/2015

## 2015-01-21 NOTE — Progress Notes (Signed)
Patient ID: [name], female   DOB: Jan 05, 1956, 59 y.o.   MRN: [id number] 12 Days Post-Op  Subjective: No complaints this morning.  Objective: Vital signs in last 24 hours: Temp:  [98.3 F (36.8 C)-100.2 F (37.9 C)] 98.3 F (36.8 C) (10/09 0730) Pulse Rate:  [80-104] 80 (10/09 0730) Resp:  [16-18] 16 (10/09 0501) BP: (107-123)/(50-56) 123/55 mmHg (10/09 0730) SpO2:  [96 %-100 %] 96 % (10/09 0501) Last BM Date: 01/20/15  Intake/Output from previous day: 10/08 0701 - 10/09 0700 In: 2413.3 [P.O.:1200; I.V.:1213.3] Out: 827 [Urine:650; Stool:177] Intake/Output this shift: Total I/O In: 200 [I.V.:200] Out: -   General appearance: alert, cooperative and no distress GI: mild incisional tenderness Incision/Wound: dressing clean and dry, just change, did not examine wound.  Lab Results:   Recent Labs  01/19/15 0500 01/20/15 0045  WBC 10.7* 9.5  HGB 8.5* 8.2*  HCT 25.6* 24.4*  PLT 633* 660*   BMET  Recent Labs  01/19/15 0500 01/20/15 0045  NA 128* 131*  K 4.5 4.4  CL 96* 97*  CO2 24 28  GLUCOSE 120* 168*  BUN 5* 6  CREATININE 0.70 0.67  CALCIUM 8.1* 8.4*     Studies/Results: No results found.  Anti-infectives: Anti-infectives    Start     Dose/Rate Route Frequency Ordered Stop   01/19/15 2000  ciprofloxacin (CIPRO) tablet 500 mg     500 mg Oral 2 times daily 01/19/15 1134     01/19/15 1400  metroNIDAZOLE (FLAGYL) tablet 500 mg     500 mg Oral 3 times per day 01/19/15 1134     01/09/15 2015  ciprofloxacin (CIPRO) IVPB 400 mg  Status:  Discontinued     400 mg 200 mL/hr over 60 Minutes Intravenous Every 12 hours 01/09/15 2013 01/19/15 1134   01/09/15 2015  metroNIDAZOLE (FLAGYL) IVPB 500 mg  Status:  Discontinued     500 mg 100 mL/hr over 60 Minutes Intravenous Every 8 hours 01/09/15 2013 01/19/15 1134      Assessment/Plan: s/p Procedure(s): EXPLORATORY LAPAROTOMY Resection and re-siting of colostomy, repair of paracolostomy hernia, abdominal  irrigation and drainage for perforation post colonoscopy APPENDECTOMY Doing well. Anticipate discharge tomorrow. WBC just down to normal. I would continue oral antibiotics post discharge for about a week.   LOS: 11 days    Marti Acebo T 01/21/2015

## 2015-01-22 MED ORDER — CIPROFLOXACIN HCL 500 MG PO TABS: 500.0000 mg | ORAL_TABLET | Freq: Two times a day (BID) | ORAL | Status: DC

## 2015-01-22 MED ORDER — METRONIDAZOLE 500 MG PO TABS: 500.0000 mg | ORAL_TABLET | Freq: Three times a day (TID) | ORAL | Status: DC

## 2015-01-22 MED ORDER — OXYCODONE HCL 10 MG PO TABS: 5.0000 mg | ORAL_TABLET | ORAL | Status: DC | PRN

## 2015-01-22 NOTE — Care Management Important Message (Signed)
Important Message  Patient Details  Name: [name] MRN: [id number] Date of Birth: [birth year]   Medicare Important Message Given:  [facility] notification given    Camillo Flaming 01/22/2015, 2:03 [place] Message  Patient Details  Name: [name] MRN: [id number] Date of Birth: 1955/06/27   Medicare Important Message Given:  Yes-second notification given    Camillo Flaming 01/22/2015, 2:02 PM

## 2015-01-22 NOTE — Consult Note (Addendum)
WOC ostomy follow-up: Pt states she changed her own pouch earlier and denies need for assistance.  States, "I have had an ostomy before and I know how to care for it."  Denies further questions regarding pouching activities or ordering supplies and informed me that she has pouches at home already.  [facility] following for assessment and plan of care to abd wound.  They have decided pt will not be using a Vac machine and have ordered moist gauze packing BID for bedside nurses to perform. Please re-consult if further assistance is needed.  Thank-you,  Julien Girt MSN, [place], [place], [place], [place]

## 2015-01-22 NOTE — Discharge Summary (Signed)
Patient ID: [name] MRN: [id number] DOB/AGE: [birth year] 58 y.o.  Admit date: 01/09/2015 Discharge date: 01/22/2015  Procedures: Exploratory laparotomy, resection and revision of colostomy, appendectomy, omentectomy, repair incisional hernia and placement of pelvic drains, Dr. Adonis Housekeeper 01-10-15  Consults: None  Reason for Admission: Patient is a 59 year old female with a history of rectal cancer, status post low anterior resection of the rectum in 2010 followed by radiation and chemotherapy. She subsequently had a loop colostomy in the left abdomen at [facility] due to incontinence. Following this she developed a large parastomal hernia and had a laparoscopic repair with conversion of her loop colostomy to a Hartmann colostomy and repair of her peristomal hernia with a piece of biologic material with a Sugarbaker technique. This was in January 2015. She has had a recurrence of her hernia in recent months. She underwent routine screening colonoscopy today and immediately after the procedure complained of severe pain in her abdomen which has persisted. She describes severe sharp and aching pain radiating from her epigastrium down to her colostomy on the left side and her entire left abdomen. It hurts to move or take a deep breath. She was not having any significant abdominal complaints prior to the colonoscopy other than some occasional discomfort around her stoma. She has had nausea without vomiting. She was transferred to the emergency department for further evaluation.   Admission Diagnoses:  1. Perforation of viscus, s/p colonoscopy 2. HTN 3. Rectosigmoid cancer 4. Multiple abdominal surgeries for colostomies  Hospital Course: The patient was admitted and taken to the operating room for the above procedure. She tolerated this procedure well.  She had an NGT placed intraoperatively that remained in place for only 2 days.  This was DC on POD 2, but was continued NPO.  The next day,  she began to have ostomy output and she was started on clear liquids.  She did have 2 JP drains as well placed in the OR and these remained in for over a week.  These remained serous and were removed prior to her discharge home.  She had her midline wounds packed with a VAC on POD 3.  However, this was removed on POD 5 as her wound did not appear very clean.  This wound and her old colostomy site were both packed with NS WD dressing changes BID.  Her fascia began to separate and eventually she dehisced the upper half of her wound.  She was granulated in well with her bowel and there was no evisceration.  Dressing changes were continued and her wound was followed closely for the duration of her stay.   Her diet was able to be advanced on POD 4 to full liquids and then advanced as tolerates.  She had minimal appetite initially and resource breeze was added.  By day of discharge, she was eating much better.  She had trouble controlling her pain and finally had her PCA taken away on POD 8 and was transitioned to oral pain meds.  She was kept on IV abx for the duration of her stay and was sent home on one additional week of oral cipro and flagyl.    Her remaining medical problems remained stable while in the hospital.  She was felt stable on POD 13 for dc home with home health for wound care.   Discharge Diagnoses:  Active Problems:   Colon perforation (HCC) wound dehiscence  HTN S/p Exploratory laparotomy, resection and revision of colostomy, appendectomy, omentectomy, repair incisional hernia and  placement of pelvic drains  History of rectosigmoid cancer  Discharge Medications:   Medication List    STOP taking these medications        oxyCODONE-acetaminophen 5-325 MG tablet  Commonly known as:  PERCOCET/ROXICET      TAKE these medications        ALPRAZolam 0.5 MG tablet  Commonly known as:  XANAX  Take 1 tablet (0.5 mg total) by mouth 2 (two) times daily as needed for anxiety or sleep.      amitriptyline 25 MG tablet  Commonly known as:  ELAVIL  Take 1 tablet (25 mg total) by mouth at bedtime as needed for sleep.     ciprofloxacin 500 MG tablet  Commonly known as:  CIPRO  Take 1 tablet (500 mg total) by mouth 2 (two) times daily.     citalopram 20 MG tablet  Commonly known as:  CELEXA  Take 1 tablet (20 mg total) by mouth every morning.     gabapentin 400 MG capsule  Commonly known as:  NEURONTIN  Take 3 capsules (1,200 mg total) by mouth 3 (three) times daily.     hydrochlorothiazide 25 MG tablet  Commonly known as:  HYDRODIURIL  Take 25 mg by mouth every morning.     lisinopril 20 MG tablet  Commonly known as:  PRINIVIL,ZESTRIL  Take 1 tablet (20 mg total) by mouth daily. When BP averages > 140/90     metroNIDAZOLE 500 MG tablet  Commonly known as:  FLAGYL  Take 1 tablet (500 mg total) by mouth every 8 (eight) hours.     naproxen sodium 220 MG tablet  Commonly known as:  ANAPROX  Take 440 mg by mouth 2 (two) times daily as needed (pain).     Oxycodone HCl 10 MG Tabs  Take 0.5-2 tablets (5-20 mg total) by mouth every 4 (four) hours as needed for moderate pain, severe pain or breakthrough pain.        Discharge Instructions:     Follow-up Information    Follow up with HOXWORTH,BENJAMIN T, MD. Schedule an appointment as soon as possible for a visit in 2 weeks.   Specialty:  General Surgery   Contact information:   [address] [place] [address] [phone]       Follow up with [facility].   Why:  Home Health nurse for dressing changes and aide   Contact information:   [address] [place] [address] [phone]       Signed: Henreitta Cea 01/22/2015, 1:04 PM

## 2015-01-23 DIAGNOSIS — I10 Essential (primary) hypertension: Secondary | ICD-10-CM | POA: Diagnosis not present

## 2015-01-23 DIAGNOSIS — F329 Major depressive disorder, single episode, unspecified: Secondary | ICD-10-CM | POA: Diagnosis not present

## 2015-01-23 DIAGNOSIS — Z433 Encounter for attention to colostomy: Secondary | ICD-10-CM | POA: Diagnosis not present

## 2015-01-23 DIAGNOSIS — Z48815 Encounter for surgical aftercare following surgery on the digestive system: Secondary | ICD-10-CM | POA: Diagnosis not present

## 2015-01-23 DIAGNOSIS — Z4801 Encounter for change or removal of surgical wound dressing: Secondary | ICD-10-CM | POA: Diagnosis not present

## 2015-01-23 DIAGNOSIS — D649 Anemia, unspecified: Secondary | ICD-10-CM | POA: Diagnosis not present

## 2015-01-23 DIAGNOSIS — Z85038 Personal history of other malignant neoplasm of large intestine: Secondary | ICD-10-CM | POA: Diagnosis not present

## 2015-01-24 DIAGNOSIS — I10 Essential (primary) hypertension: Secondary | ICD-10-CM | POA: Diagnosis not present

## 2015-01-24 DIAGNOSIS — Z48815 Encounter for surgical aftercare following surgery on the digestive system: Secondary | ICD-10-CM | POA: Diagnosis not present

## 2015-01-24 DIAGNOSIS — Z433 Encounter for attention to colostomy: Secondary | ICD-10-CM | POA: Diagnosis not present

## 2015-01-24 DIAGNOSIS — F329 Major depressive disorder, single episode, unspecified: Secondary | ICD-10-CM | POA: Diagnosis not present

## 2015-01-24 DIAGNOSIS — Z4801 Encounter for change or removal of surgical wound dressing: Secondary | ICD-10-CM | POA: Diagnosis not present

## 2015-01-24 DIAGNOSIS — D649 Anemia, unspecified: Secondary | ICD-10-CM | POA: Diagnosis not present

## 2015-01-26 DIAGNOSIS — D649 Anemia, unspecified: Secondary | ICD-10-CM | POA: Diagnosis not present

## 2015-01-26 DIAGNOSIS — Z48815 Encounter for surgical aftercare following surgery on the digestive system: Secondary | ICD-10-CM | POA: Diagnosis not present

## 2015-01-26 DIAGNOSIS — I10 Essential (primary) hypertension: Secondary | ICD-10-CM | POA: Diagnosis not present

## 2015-01-26 DIAGNOSIS — F329 Major depressive disorder, single episode, unspecified: Secondary | ICD-10-CM | POA: Diagnosis not present

## 2015-01-26 DIAGNOSIS — Z433 Encounter for attention to colostomy: Secondary | ICD-10-CM | POA: Diagnosis not present

## 2015-01-26 DIAGNOSIS — Z4801 Encounter for change or removal of surgical wound dressing: Secondary | ICD-10-CM | POA: Diagnosis not present

## 2015-01-29 DIAGNOSIS — Z433 Encounter for attention to colostomy: Secondary | ICD-10-CM | POA: Diagnosis not present

## 2015-01-29 DIAGNOSIS — D649 Anemia, unspecified: Secondary | ICD-10-CM | POA: Diagnosis not present

## 2015-01-29 DIAGNOSIS — I10 Essential (primary) hypertension: Secondary | ICD-10-CM | POA: Diagnosis not present

## 2015-01-29 DIAGNOSIS — Z4801 Encounter for change or removal of surgical wound dressing: Secondary | ICD-10-CM | POA: Diagnosis not present

## 2015-01-29 DIAGNOSIS — Z48815 Encounter for surgical aftercare following surgery on the digestive system: Secondary | ICD-10-CM | POA: Diagnosis not present

## 2015-01-29 DIAGNOSIS — F329 Major depressive disorder, single episode, unspecified: Secondary | ICD-10-CM | POA: Diagnosis not present

## 2015-01-31 DIAGNOSIS — Z4801 Encounter for change or removal of surgical wound dressing: Secondary | ICD-10-CM | POA: Diagnosis not present

## 2015-01-31 DIAGNOSIS — F329 Major depressive disorder, single episode, unspecified: Secondary | ICD-10-CM | POA: Diagnosis not present

## 2015-01-31 DIAGNOSIS — I10 Essential (primary) hypertension: Secondary | ICD-10-CM | POA: Diagnosis not present

## 2015-01-31 DIAGNOSIS — D649 Anemia, unspecified: Secondary | ICD-10-CM | POA: Diagnosis not present

## 2015-01-31 DIAGNOSIS — Z48815 Encounter for surgical aftercare following surgery on the digestive system: Secondary | ICD-10-CM | POA: Diagnosis not present

## 2015-01-31 DIAGNOSIS — Z433 Encounter for attention to colostomy: Secondary | ICD-10-CM | POA: Diagnosis not present

## 2015-02-01 ENCOUNTER — Encounter (HOSPITAL_COMMUNITY): Payer: Self-pay | Admitting: General Surgery

## 2015-02-02 DIAGNOSIS — Z433 Encounter for attention to colostomy: Secondary | ICD-10-CM | POA: Diagnosis not present

## 2015-02-02 DIAGNOSIS — I10 Essential (primary) hypertension: Secondary | ICD-10-CM | POA: Diagnosis not present

## 2015-02-02 DIAGNOSIS — D649 Anemia, unspecified: Secondary | ICD-10-CM | POA: Diagnosis not present

## 2015-02-02 DIAGNOSIS — F329 Major depressive disorder, single episode, unspecified: Secondary | ICD-10-CM | POA: Diagnosis not present

## 2015-02-02 DIAGNOSIS — Z4801 Encounter for change or removal of surgical wound dressing: Secondary | ICD-10-CM | POA: Diagnosis not present

## 2015-02-02 DIAGNOSIS — Z48815 Encounter for surgical aftercare following surgery on the digestive system: Secondary | ICD-10-CM | POA: Diagnosis not present

## 2015-02-05 DIAGNOSIS — Z48815 Encounter for surgical aftercare following surgery on the digestive system: Secondary | ICD-10-CM | POA: Diagnosis not present

## 2015-02-05 DIAGNOSIS — F329 Major depressive disorder, single episode, unspecified: Secondary | ICD-10-CM | POA: Diagnosis not present

## 2015-02-05 DIAGNOSIS — Z433 Encounter for attention to colostomy: Secondary | ICD-10-CM | POA: Diagnosis not present

## 2015-02-05 DIAGNOSIS — Z4801 Encounter for change or removal of surgical wound dressing: Secondary | ICD-10-CM | POA: Diagnosis not present

## 2015-02-05 DIAGNOSIS — D649 Anemia, unspecified: Secondary | ICD-10-CM | POA: Diagnosis not present

## 2015-02-05 DIAGNOSIS — I10 Essential (primary) hypertension: Secondary | ICD-10-CM | POA: Diagnosis not present

## 2015-02-07 DIAGNOSIS — Z48815 Encounter for surgical aftercare following surgery on the digestive system: Secondary | ICD-10-CM | POA: Diagnosis not present

## 2015-02-07 DIAGNOSIS — D649 Anemia, unspecified: Secondary | ICD-10-CM | POA: Diagnosis not present

## 2015-02-07 DIAGNOSIS — I10 Essential (primary) hypertension: Secondary | ICD-10-CM | POA: Diagnosis not present

## 2015-02-07 DIAGNOSIS — Z433 Encounter for attention to colostomy: Secondary | ICD-10-CM | POA: Diagnosis not present

## 2015-02-07 DIAGNOSIS — Z4801 Encounter for change or removal of surgical wound dressing: Secondary | ICD-10-CM | POA: Diagnosis not present

## 2015-02-07 DIAGNOSIS — F329 Major depressive disorder, single episode, unspecified: Secondary | ICD-10-CM | POA: Diagnosis not present

## 2015-02-09 DIAGNOSIS — Z433 Encounter for attention to colostomy: Secondary | ICD-10-CM | POA: Diagnosis not present

## 2015-02-09 DIAGNOSIS — Z4801 Encounter for change or removal of surgical wound dressing: Secondary | ICD-10-CM | POA: Diagnosis not present

## 2015-02-09 DIAGNOSIS — D649 Anemia, unspecified: Secondary | ICD-10-CM | POA: Diagnosis not present

## 2015-02-09 DIAGNOSIS — I10 Essential (primary) hypertension: Secondary | ICD-10-CM | POA: Diagnosis not present

## 2015-02-09 DIAGNOSIS — Z48815 Encounter for surgical aftercare following surgery on the digestive system: Secondary | ICD-10-CM | POA: Diagnosis not present

## 2015-02-09 DIAGNOSIS — F329 Major depressive disorder, single episode, unspecified: Secondary | ICD-10-CM | POA: Diagnosis not present

## 2015-02-13 DIAGNOSIS — Z4801 Encounter for change or removal of surgical wound dressing: Secondary | ICD-10-CM | POA: Diagnosis not present

## 2015-02-13 DIAGNOSIS — F329 Major depressive disorder, single episode, unspecified: Secondary | ICD-10-CM | POA: Diagnosis not present

## 2015-02-13 DIAGNOSIS — Z433 Encounter for attention to colostomy: Secondary | ICD-10-CM | POA: Diagnosis not present

## 2015-02-13 DIAGNOSIS — I10 Essential (primary) hypertension: Secondary | ICD-10-CM | POA: Diagnosis not present

## 2015-02-13 DIAGNOSIS — D649 Anemia, unspecified: Secondary | ICD-10-CM | POA: Diagnosis not present

## 2015-02-13 DIAGNOSIS — Z48815 Encounter for surgical aftercare following surgery on the digestive system: Secondary | ICD-10-CM | POA: Diagnosis not present

## 2015-02-15 DIAGNOSIS — Z4801 Encounter for change or removal of surgical wound dressing: Secondary | ICD-10-CM | POA: Diagnosis not present

## 2015-02-15 DIAGNOSIS — F329 Major depressive disorder, single episode, unspecified: Secondary | ICD-10-CM | POA: Diagnosis not present

## 2015-02-15 DIAGNOSIS — D649 Anemia, unspecified: Secondary | ICD-10-CM | POA: Diagnosis not present

## 2015-02-15 DIAGNOSIS — Z433 Encounter for attention to colostomy: Secondary | ICD-10-CM | POA: Diagnosis not present

## 2015-02-15 DIAGNOSIS — I10 Essential (primary) hypertension: Secondary | ICD-10-CM | POA: Diagnosis not present

## 2015-02-15 DIAGNOSIS — Z48815 Encounter for surgical aftercare following surgery on the digestive system: Secondary | ICD-10-CM | POA: Diagnosis not present

## 2015-02-20 DIAGNOSIS — F329 Major depressive disorder, single episode, unspecified: Secondary | ICD-10-CM | POA: Diagnosis not present

## 2015-02-20 DIAGNOSIS — D649 Anemia, unspecified: Secondary | ICD-10-CM | POA: Diagnosis not present

## 2015-02-20 DIAGNOSIS — Z48815 Encounter for surgical aftercare following surgery on the digestive system: Secondary | ICD-10-CM | POA: Diagnosis not present

## 2015-02-20 DIAGNOSIS — I10 Essential (primary) hypertension: Secondary | ICD-10-CM | POA: Diagnosis not present

## 2015-02-20 DIAGNOSIS — Z4801 Encounter for change or removal of surgical wound dressing: Secondary | ICD-10-CM | POA: Diagnosis not present

## 2015-02-20 DIAGNOSIS — Z433 Encounter for attention to colostomy: Secondary | ICD-10-CM | POA: Diagnosis not present

## 2015-02-27 DIAGNOSIS — Z48815 Encounter for surgical aftercare following surgery on the digestive system: Secondary | ICD-10-CM | POA: Diagnosis not present

## 2015-02-27 DIAGNOSIS — I10 Essential (primary) hypertension: Secondary | ICD-10-CM | POA: Diagnosis not present

## 2015-02-27 DIAGNOSIS — Z4801 Encounter for change or removal of surgical wound dressing: Secondary | ICD-10-CM | POA: Diagnosis not present

## 2015-02-27 DIAGNOSIS — Z433 Encounter for attention to colostomy: Secondary | ICD-10-CM | POA: Diagnosis not present

## 2015-02-27 DIAGNOSIS — F329 Major depressive disorder, single episode, unspecified: Secondary | ICD-10-CM | POA: Diagnosis not present

## 2015-02-27 DIAGNOSIS — D649 Anemia, unspecified: Secondary | ICD-10-CM | POA: Diagnosis not present

## 2015-03-01 DIAGNOSIS — Z48815 Encounter for surgical aftercare following surgery on the digestive system: Secondary | ICD-10-CM | POA: Diagnosis not present

## 2015-03-01 DIAGNOSIS — Z4801 Encounter for change or removal of surgical wound dressing: Secondary | ICD-10-CM | POA: Diagnosis not present

## 2015-03-01 DIAGNOSIS — I10 Essential (primary) hypertension: Secondary | ICD-10-CM | POA: Diagnosis not present

## 2015-03-01 DIAGNOSIS — D649 Anemia, unspecified: Secondary | ICD-10-CM | POA: Diagnosis not present

## 2015-03-01 DIAGNOSIS — Z433 Encounter for attention to colostomy: Secondary | ICD-10-CM | POA: Diagnosis not present

## 2015-03-01 DIAGNOSIS — F329 Major depressive disorder, single episode, unspecified: Secondary | ICD-10-CM | POA: Diagnosis not present

## 2015-03-06 DIAGNOSIS — Z433 Encounter for attention to colostomy: Secondary | ICD-10-CM | POA: Diagnosis not present

## 2015-03-06 DIAGNOSIS — I10 Essential (primary) hypertension: Secondary | ICD-10-CM | POA: Diagnosis not present

## 2015-03-06 DIAGNOSIS — F329 Major depressive disorder, single episode, unspecified: Secondary | ICD-10-CM | POA: Diagnosis not present

## 2015-03-06 DIAGNOSIS — D649 Anemia, unspecified: Secondary | ICD-10-CM | POA: Diagnosis not present

## 2015-03-06 DIAGNOSIS — Z4801 Encounter for change or removal of surgical wound dressing: Secondary | ICD-10-CM | POA: Diagnosis not present

## 2015-03-06 DIAGNOSIS — Z48815 Encounter for surgical aftercare following surgery on the digestive system: Secondary | ICD-10-CM | POA: Diagnosis not present

## 2015-03-13 DIAGNOSIS — Z4801 Encounter for change or removal of surgical wound dressing: Secondary | ICD-10-CM | POA: Diagnosis not present

## 2015-03-13 DIAGNOSIS — I10 Essential (primary) hypertension: Secondary | ICD-10-CM | POA: Diagnosis not present

## 2015-03-13 DIAGNOSIS — F329 Major depressive disorder, single episode, unspecified: Secondary | ICD-10-CM | POA: Diagnosis not present

## 2015-03-13 DIAGNOSIS — Z48815 Encounter for surgical aftercare following surgery on the digestive system: Secondary | ICD-10-CM | POA: Diagnosis not present

## 2015-03-13 DIAGNOSIS — Z433 Encounter for attention to colostomy: Secondary | ICD-10-CM | POA: Diagnosis not present

## 2015-03-13 DIAGNOSIS — D649 Anemia, unspecified: Secondary | ICD-10-CM | POA: Diagnosis not present

## 2015-03-19 DIAGNOSIS — Z433 Encounter for attention to colostomy: Secondary | ICD-10-CM | POA: Diagnosis not present

## 2015-03-19 DIAGNOSIS — D649 Anemia, unspecified: Secondary | ICD-10-CM | POA: Diagnosis not present

## 2015-03-19 DIAGNOSIS — I10 Essential (primary) hypertension: Secondary | ICD-10-CM | POA: Diagnosis not present

## 2015-03-19 DIAGNOSIS — Z4801 Encounter for change or removal of surgical wound dressing: Secondary | ICD-10-CM | POA: Diagnosis not present

## 2015-03-19 DIAGNOSIS — F329 Major depressive disorder, single episode, unspecified: Secondary | ICD-10-CM | POA: Diagnosis not present

## 2015-03-19 DIAGNOSIS — Z48815 Encounter for surgical aftercare following surgery on the digestive system: Secondary | ICD-10-CM | POA: Diagnosis not present

## 2015-04-07 IMAGING — CT CT ABD-PELV W/ CM
2 of 4 series · 15 of 46 positions shown, 17 images · IV contrast (omnipaque)
Comparison: Abdomen and pelvis CT from 03/24/2013. Chest abdomen
and pelvis CT from 04/28/2011.

ADDENDUM:
Voiced recognition error noted in body of report. Under the soft
tissues/mediastinum section, the fourth sentence should read
"Abberant right subclavian artery again noted." not "Apparent right
subclavian artery again noted."
CLINICAL DATA: Rectal cancer with colostomy.

EXAM:
CT CHEST, ABDOMEN, AND PELVIS WITH CONTRAST
TECHNIQUE: Multidetector CT imaging of the chest, abdomen and pelvis was
performed following the standard protocol during bolus
administration of intravenous contrast.
CONTRAST:  100mL OMNIPAQUE IOHEXOL 300 MG/ML  SOLN

[Series 2: cap with st · axial · 0.73mm/px · z∈[-522,+28]mm · 12 of 120 slices shown, 14 images]
[im 5/120  soft-tissue]
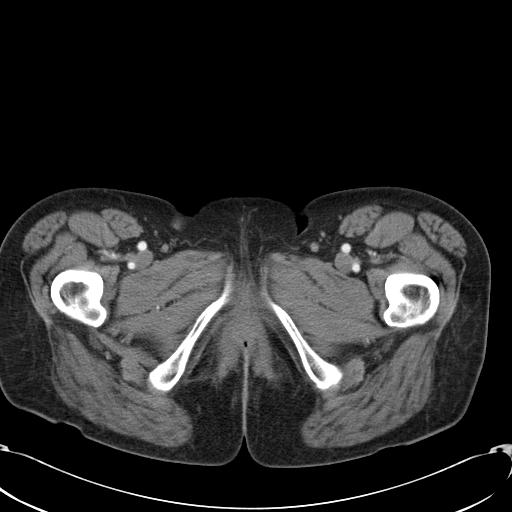
[im 5/120  bone]
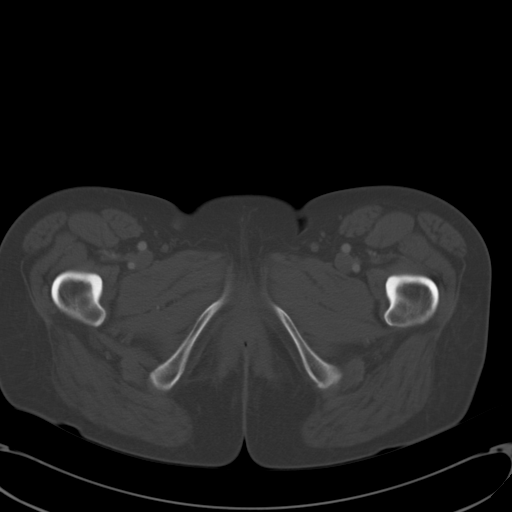
[im 15/120  soft-tissue]
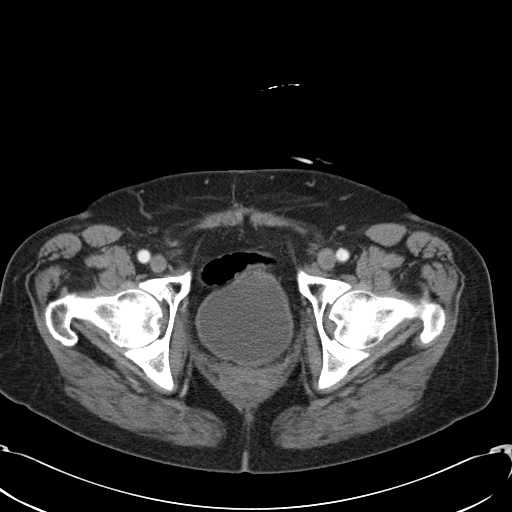
[im 25/120  soft-tissue]
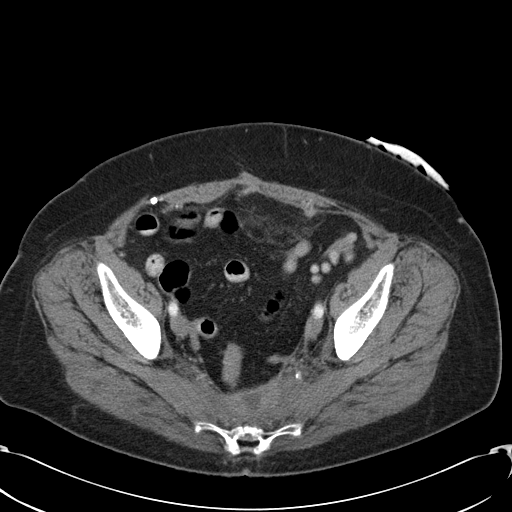
[im 35/120  soft-tissue]
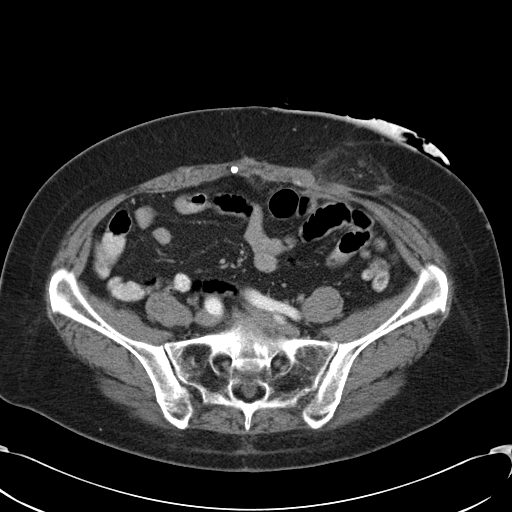
[im 45/120  soft-tissue]
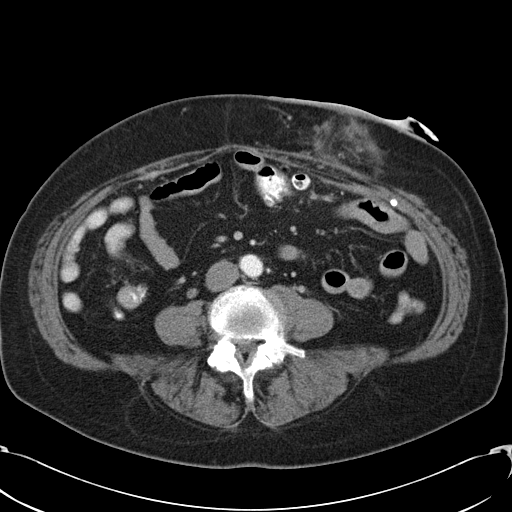
[im 55/120  soft-tissue]
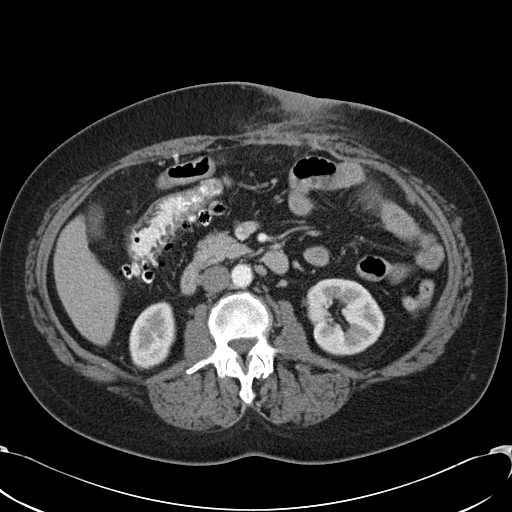
[im 65/120  soft-tissue]
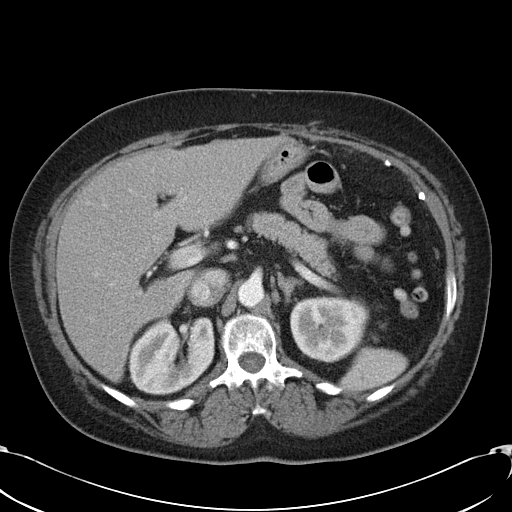
[im 75/120  soft-tissue]
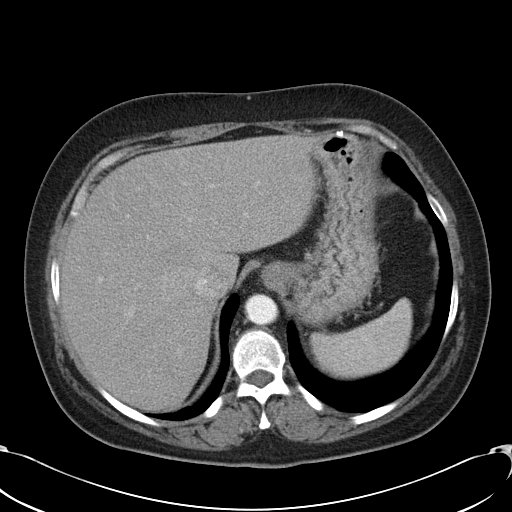
[im 85/120  soft-tissue]
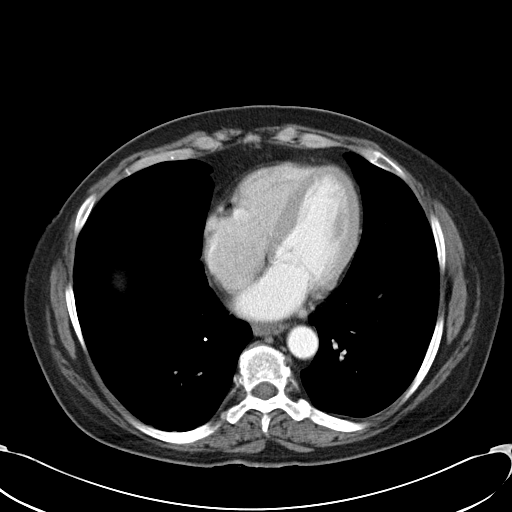
[im 85/120  bone]
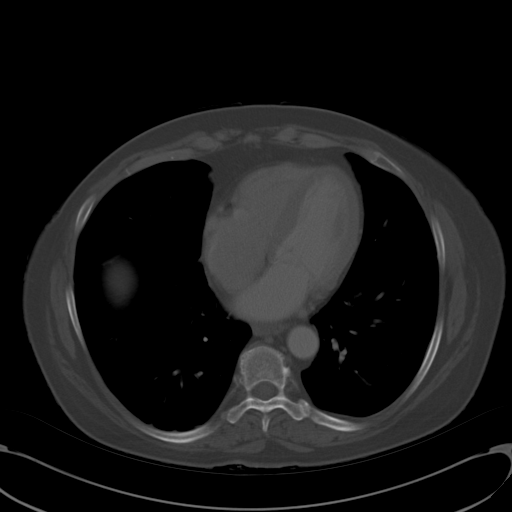
[im 95/120  soft-tissue]
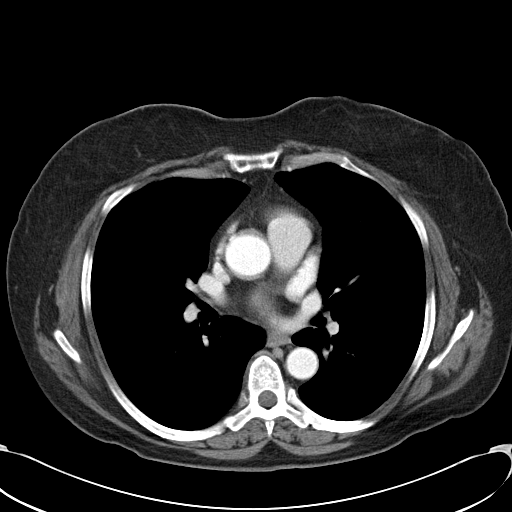
[im 105/120  soft-tissue]
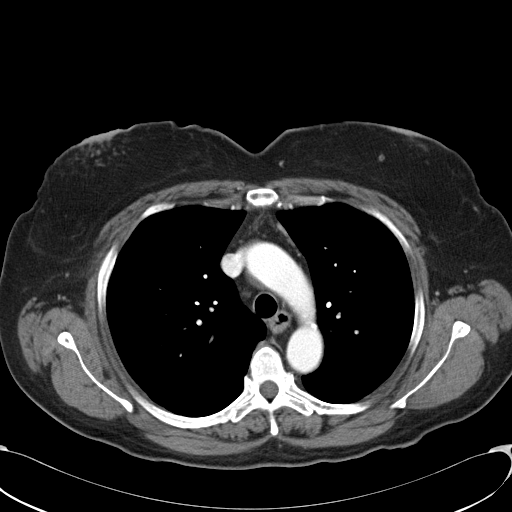
[im 115/120  soft-tissue]
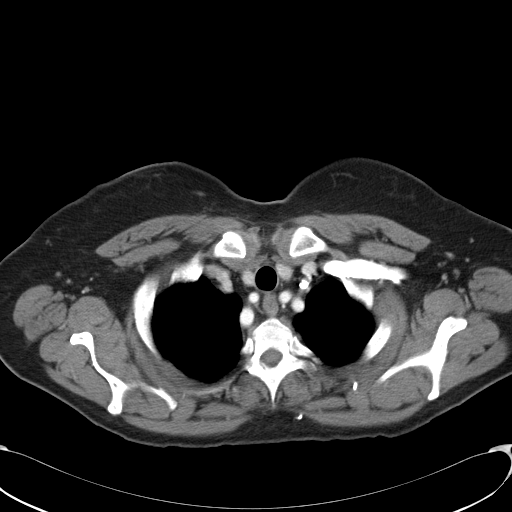

[Series 602: <mpr thick range> · coronal · 1.17mm/px · 3 of 103 slices shown]
[im 35/103  soft-tissue]
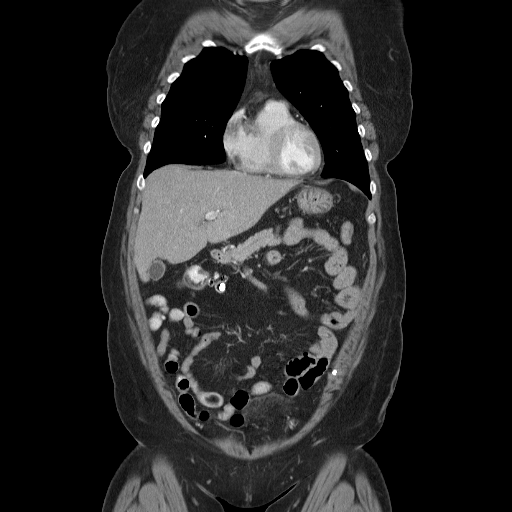
[im 46/103  soft-tissue]
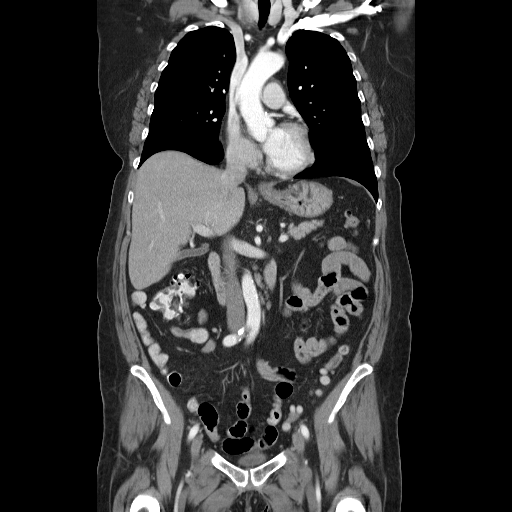
[im 57/103  soft-tissue]
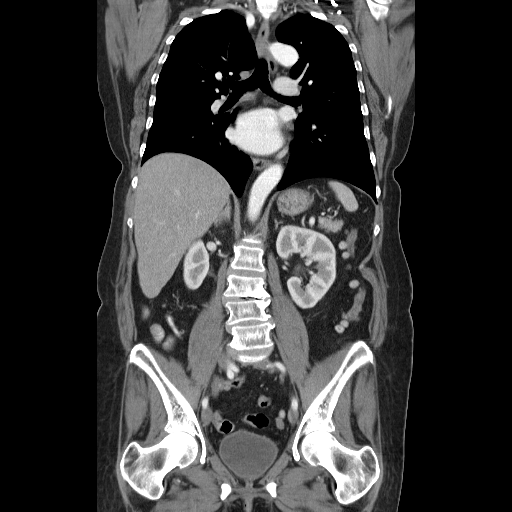

[15 of 46 positions shown; findings below may reference images not displayed]

FINDINGS: CT CHEST FINDINGS

Soft tissue / Mediastinum: There is no axillary lymphadenopathy.
Thyroid gland is unremarkable. No mediastinal or hilar
lymphadenopathy. Apparent right subclavian artery again noted.
Coronary artery calcification is noted. Heart size is normal. No
pericardial effusion.

Lungs / Pleura: No pulmonary parenchymal nodule or mass. No pleural
effusion. No focal airspace consolidation or pulmonary edema.

Bones: Bone windows reveal no worrisome lytic or sclerotic osseous
lesions.

CT ABDOMEN AND PELVIS FINDINGS

Liver: Small transient hepatic attenuation difference is identified
in the lateral segment of the left liver, at the falciform ligament.
This is stable and likely reflects anomalous venous perfusion. No
discrete enhancing liver lesion.

Spleen: Normal

Stomach: Decompressed with normal imaging features.

Pancreas: No pancreatic mass.  No pancreatic ductal dilatation.

Gallbladder/Biliary Tree: Gallbladder is nondistended without
evidence for gallstones. There is no intra or extrahepatic biliary
duct dilatation.

Kidneys/Adrenals: No adrenal nodule or mass. No hydronephrosis in
either kidney. No enhancing renal mass. A tiny low-density focus in
the interpolar left kidney (4 mm) is probably a tiny cyst.

Bowel Loops: Duodenum is normal. No evidence for small bowel
obstruction. Diverticular stress that diverticular are seen in the
terminal ileum. The appendix is normal. There is diffuse
diverticular disease in the colon. Patient has a distal transverse
and colostomy with a long Hartmann's pouch.

Nodes: No retroperitoneal lymphadenopathy in the abdomen. There is
no gastrohepatic or hepatoduodenal ligament lymphadenopathy. Imaging
through the pelvis shows no pelvic sidewall lymphadenopathy.

Vasculature: Atherosclerotic calcification is noted in the wall of
the abdominal aorta without aneurysm.

Pelvic Genitourinary: Uterus is surgically absent. Uterine size is
within normal limits for age. There is no adnexal mass.

Bones/Musculoskeletal: Bone windows reveal no worrisome lytic or
sclerotic osseous lesions. Degenerative disc disease is seen at L3-4

Body Wall: As mentioned above, there is a left lower abdominal wall
end colostomy with peristomal herniation of mesenteric fat. Cranial
to the ostomy, and area of ill-defined soft tissue attenuation is
seen in the subcutaneous fat, corresponding to the previous location
of the loop colostomy. This may reflect scarring although cellulitis
could have this appearance. There is no focal or rim enhancing fluid
collection in this region to suggest a subcutaneous abscess.

Other: A ventral mesh placement is no in the interval. The abnormal
presacral soft tissue attenuation persists without appreciable
interval change. The 2.2 cm fluid collection within this abnormal
soft tissue is stable. These abnormal presacral changes are likely
related to the surgery given the interval stability but continued
attention on followup imaging is recommended to ensure that there is
no progression of the soft tissue.
IMPRESSION: Stable exam. No new or progressive findings to suggest recurrent
neoplasm. The amorphous soft tissue in the presacral space is
stable.

A amorphous soft tissue attenuation in the subcutaneous fat at the
level of the previous loop colostomy. These changes are probably
related to granulation although cellulitis could have this
appearance by CT imaging. There is no organized or rim enhancing
fluid collection at this location to suggest the presence of a
subcutaneous abscess.

## 2015-04-10 ENCOUNTER — Encounter: Payer: Self-pay | Admitting: Internal Medicine

## 2015-04-10 ENCOUNTER — Ambulatory Visit (INDEPENDENT_AMBULATORY_CARE_PROVIDER_SITE_OTHER): Payer: Medicare Other | Admitting: Internal Medicine

## 2015-04-10 ENCOUNTER — Ambulatory Visit (INDEPENDENT_AMBULATORY_CARE_PROVIDER_SITE_OTHER)
Admission: RE | Admit: 2015-04-10 | Discharge: 2015-04-10 | Disposition: A | Discharge status: Home / Self Care | Payer: Medicare Other | Source: Ambulatory Visit | Attending: Internal Medicine | Admitting: Internal Medicine

## 2015-04-10 VITALS — BP 138/88 | HR 65 | Temp 98.2°F | Resp 20 | Ht 66.0 in | Wt 173.0 lb

## 2015-04-10 DIAGNOSIS — J189 Pneumonia, unspecified organism: Secondary | ICD-10-CM

## 2015-04-10 DIAGNOSIS — I10 Essential (primary) hypertension: Secondary | ICD-10-CM

## 2015-04-10 DIAGNOSIS — K631 Perforation of intestine (nontraumatic): Secondary | ICD-10-CM

## 2015-04-10 DIAGNOSIS — K9403 Colostomy malfunction: Secondary | ICD-10-CM | POA: Diagnosis not present

## 2015-04-10 DIAGNOSIS — R05 Cough: Secondary | ICD-10-CM | POA: Diagnosis not present

## 2015-04-10 MED ORDER — PROMETHAZINE-CODEINE 6.25-10 MG/5ML PO SYRP: 5.0000 mL | ORAL_SOLUTION | ORAL | Status: DC | PRN

## 2015-04-10 MED ORDER — LEVOFLOXACIN 500 MG PO TABS: 500.0000 mg | ORAL_TABLET | Freq: Every day | ORAL | Status: DC

## 2015-04-10 NOTE — Progress Notes (Signed)
Subjective:  Patient ID: [name], female    DOB: 1955-09-05  Age: 59 y.o. MRN: [id number]  CC: No chief complaint on file.   HPI [name] presents for productive cough x 3 weeks  Outpatient Prescriptions Prior to Visit  Medication Sig Dispense Refill  . ALPRAZolam (XANAX) 0.5 MG tablet Take 1 tablet (0.5 mg total) by mouth 2 (two) times daily as needed for anxiety or sleep. 40 tablet 5  . amitriptyline (ELAVIL) 25 MG tablet Take 1 tablet (25 mg total) by mouth at bedtime as needed for sleep. 30 tablet 5  . citalopram (CELEXA) 20 MG tablet Take 1 tablet (20 mg total) by mouth every morning. 90 tablet 3  . gabapentin (NEURONTIN) 400 MG capsule Take 3 capsules (1,200 mg total) by mouth 3 (three) times daily. 270 capsule 5  . hydrochlorothiazide (HYDRODIURIL) 25 MG tablet Take 25 mg by mouth every morning.    [name] lisinopril (PRINIVIL,ZESTRIL) 20 MG tablet Take 1 tablet (20 mg total) by mouth daily. When BP averages > 140/90 30 tablet 2  . naproxen sodium (ANAPROX) 220 MG tablet Take 440 mg by mouth 2 (two) times daily as needed (pain).    . ciprofloxacin (CIPRO) 500 MG tablet Take 1 tablet (500 mg total) by mouth 2 (two) times daily. 14 tablet 0  . metroNIDAZOLE (FLAGYL) 500 MG tablet Take 1 tablet (500 mg total) by mouth every 8 (eight) hours. 21 tablet 0  . oxyCODONE 10 MG TABS Take 0.5-2 tablets (5-20 mg total) by mouth every 4 (four) hours as needed for moderate pain, severe pain or breakthrough pain. 40 tablet 0   No facility-administered medications prior to visit.    ROS Review of Systems  Constitutional: Negative for chills, activity change, appetite change, fatigue and unexpected weight change.  HENT: Negative for congestion, mouth sores and sinus pressure.   Eyes: Negative for visual disturbance.  Respiratory: Positive for cough. Negative for chest tightness and shortness of breath.   Gastrointestinal: Negative for nausea and abdominal pain.  Genitourinary: Negative  for frequency, difficulty urinating and vaginal pain.  Musculoskeletal: Negative for back pain and gait problem.  Skin: Negative for pallor and rash.  Neurological: Negative for dizziness, tremors, weakness, numbness and headaches.  Psychiatric/Behavioral: Negative for suicidal ideas, confusion and sleep disturbance.    Objective:  BP 138/88 mmHg  Pulse 65  Temp(Src) 98.2 F (36.8 C) (Oral)  Resp 20  Ht 5\' 6"  (1.676 m)  Wt 173 lb (78.472 kg)  BMI 27.94 kg/m2  SpO2 99%  BP Readings from Last 3 Encounters:  04/10/15 138/88  01/22/15 126/66  01/09/15 173/97    Wt Readings from Last 3 Encounters:  04/10/15 173 lb (78.472 kg)  01/10/15 193 lb 9 oz (87.8 kg)  01/09/15 177 lb (80.287 kg)    Physical Exam  Constitutional: She appears well-developed. No distress.  HENT:  Head: Normocephalic.  Right Ear: External ear normal.  Left Ear: External ear normal.  Nose: Nose normal.  Mouth/Throat: Oropharynx is clear and moist.  Eyes: Conjunctivae are normal. Pupils are equal, round, and reactive to light. Right eye exhibits no discharge. Left eye exhibits no discharge.  Neck: Normal range of motion. Neck supple. No JVD present. No tracheal deviation present. No thyromegaly present.  Cardiovascular: Normal rate, regular rhythm and normal heart sounds.   Pulmonary/Chest: No stridor. No respiratory distress. She has no wheezes.  Abdominal: Soft. Bowel sounds are normal. She exhibits no distension and no mass. There  is no tenderness. There is no rebound and no guarding.  Musculoskeletal: She exhibits no edema or tenderness.  Lymphadenopathy:    She has no cervical adenopathy.  Neurological: She displays normal reflexes. No cranial nerve deficit. She exhibits normal muscle tone. Coordination normal.  Skin: No rash noted. No erythema.  Psychiatric: She has a normal mood and affect. Her behavior is normal. Judgment and thought content normal.  coarse BS on R R colostomy bag  Lab Results    Component Value Date   WBC 9.5 01/20/2015   HGB 8.2* 01/20/2015   HCT 24.4* 01/20/2015   PLT 660* 01/20/2015   GLUCOSE 168* 01/20/2015   CHOL 136 05/23/2014   TRIG 320.0* 05/23/2014   HDL 44.20 05/23/2014   LDLDIRECT 44.0 05/23/2014   ALT 8* 01/19/2015   AST 14* 01/19/2015   NA 131* 01/20/2015   K 4.4 01/20/2015   CL 97* 01/20/2015   CREATININE 0.67 01/20/2015   BUN 6 01/20/2015   CO2 28 01/20/2015   TSH 0.87 05/23/2014   INR 0.91 04/01/2013   HGBA1C 6.2 09/05/2014    Dg Abd Portable 1v  01/09/2015  CLINICAL DATA:  Nasogastric tube confirmation EXAM: PORTABLE ABDOMEN - 1 VIEW COMPARISON:  None. FINDINGS: The bowel gas pattern is normal. Bowel markers are identified. Nasogastric tube is identified in the mid stomach. There is free air in the retroperitoneum this also free air beneath the right hemidiaphragm. IMPRESSION: Nasogastric tube distal tip in the mid stomach. Retroperitoneal air and free air in the abdomen. Clinical correlation is recommended. Recommend further evaluation with CT abdomen and pelvis. Electronically Signed   By: Abelardo Diesel M.D.   On: 01/09/2015 19:25    Assessment & Plan:   Diagnoses and all orders for this visit:  CAP (community acquired pneumonia) -     DG Chest 2 View  Colostomy malfunction [facility])  Essential hypertension  Colon perforation ([place])  Other orders -     levofloxacin (LEVAQUIN) 500 MG tablet; Take 1 tablet (500 mg total) by mouth daily. -     promethazine-codeine (PHENERGAN WITH CODEINE) 6.25-10 MG/5ML syrup; Take 5 mLs by mouth every 4 (four) hours as needed.   I have discontinued [name] ciprofloxacin, metroNIDAZOLE, and Oxycodone HCl. I am also having her start on levofloxacin and promethazine-codeine. Additionally, I am having her maintain her hydrochlorothiazide, naproxen sodium, lisinopril, citalopram, amitriptyline, gabapentin, and ALPRAZolam.  Meds ordered this encounter  Medications  . levofloxacin (LEVAQUIN) 500  MG tablet    Sig: Take 1 tablet (500 mg total) by mouth daily.    Dispense:  10 tablet    Refill:  0  . promethazine-codeine (PHENERGAN WITH CODEINE) 6.25-10 MG/5ML syrup    Sig: Take 5 mLs by mouth every 4 (four) hours as needed.    Dispense:  300 mL    Refill:  0     Follow-up: No Follow-up on file.  Walker Kehr, MD

## 2015-04-10 NOTE — Assessment & Plan Note (Signed)
12/16 clinically - R lung Levaquin x 10 d Prom-cod syr CXR

## 2015-04-10 NOTE — Progress Notes (Signed)
Pre visit review using our clinic review tool, if applicable. No additional management support is needed unless otherwise documented below in the visit note. 

## 2015-04-10 NOTE — Assessment & Plan Note (Signed)
Pt is not taking Lisinopril now

## 2015-04-10 NOTE — Assessment & Plan Note (Signed)
History reviewed 

## 2015-04-10 NOTE — Assessment & Plan Note (Signed)
Doing better.   

## 2015-04-13 ENCOUNTER — Telehealth: Payer: Self-pay | Admitting: Internal Medicine

## 2015-04-13 NOTE — Telephone Encounter (Signed)
Advise pt of her chest x-ray result, pt was wondering if she still need to finish the antibiotic that Dr. Camila Li got her to start? Please give her a call back

## 2015-04-13 NOTE — Telephone Encounter (Signed)
I called pt- I advised her to complete antibiotic unless her symptoms are resolved. She states she will finish it.

## 2015-05-20 IMAGING — CR DG SHOULDER 2+V*R*
3 series · 3 of 3 positions shown · non-contrast
Comparison: None.

CLINICAL DATA: Fall, right shoulder pain

EXAM:
RIGHT SHOULDER - 2+ VIEW

[w shoulder external right]
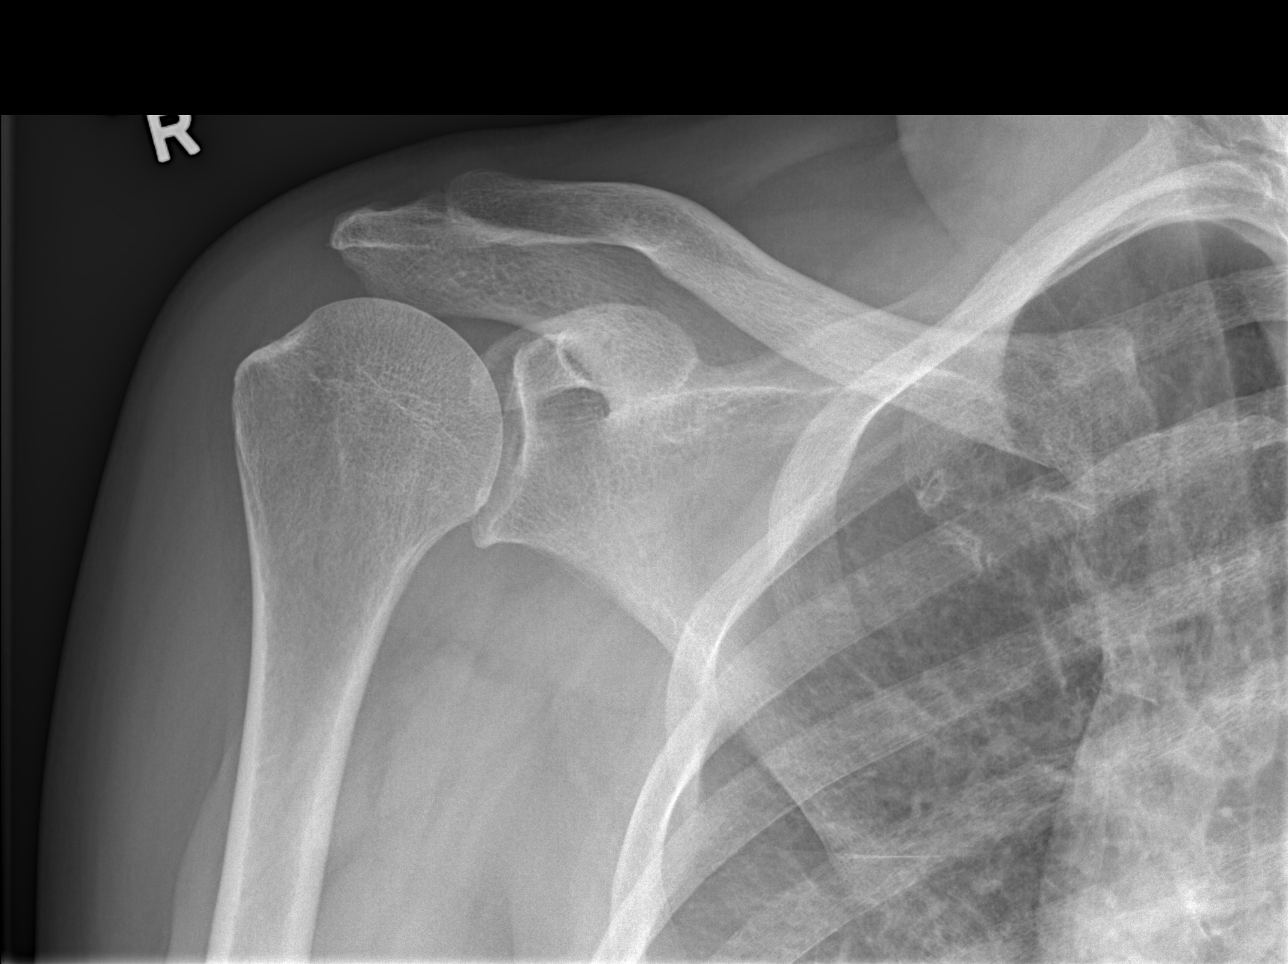

[w shoulder y-view right]
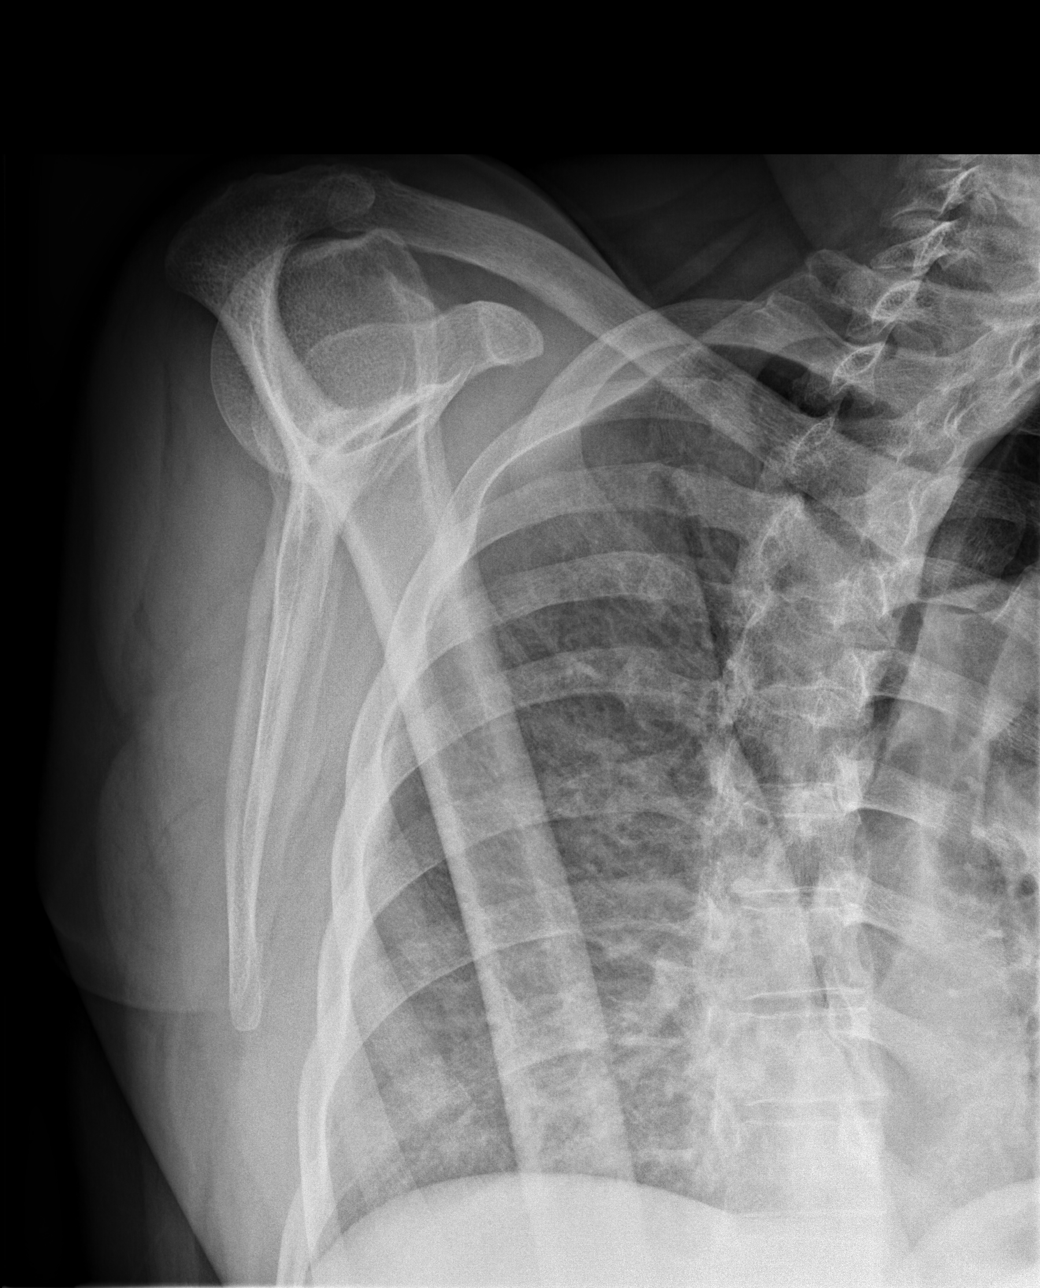

[x shoulder axillary right]
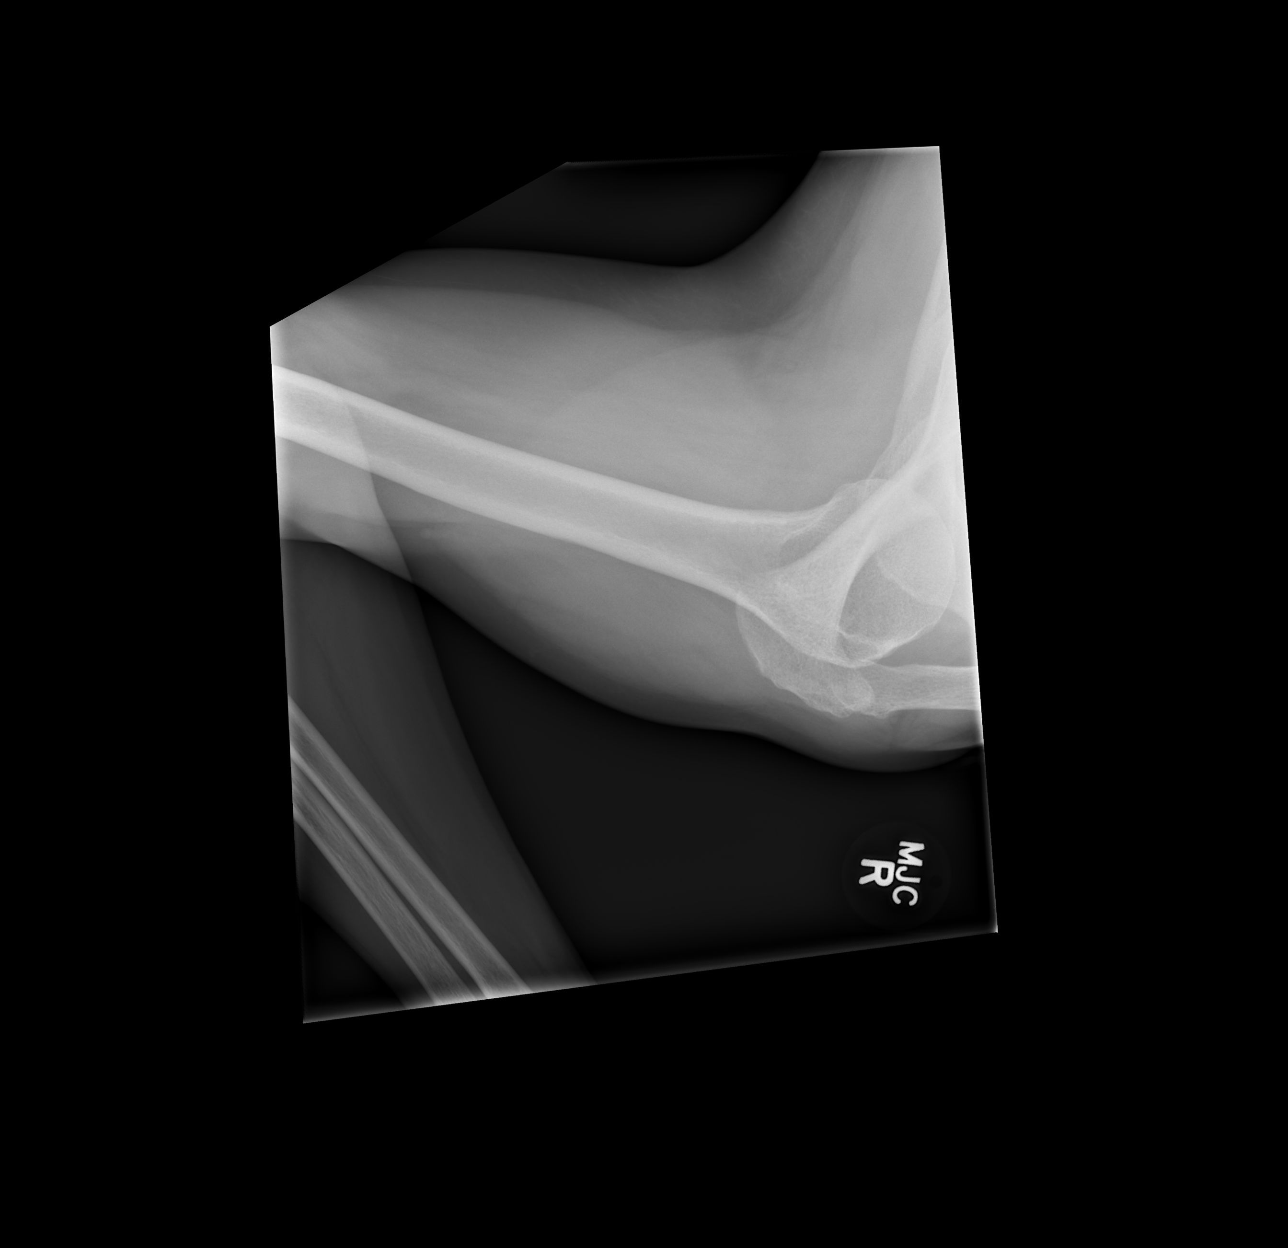

[3 of 3 positions shown; findings below may reference images not displayed]

FINDINGS: There is no evidence of fracture or dislocation. There is no
evidence of arthropathy or other focal bone abnormality. Soft
tissues are unremarkable. Axillary view is suboptimal positioned.
IMPRESSION: Negative.

## 2015-07-21 IMAGING — CR DG HAND COMPLETE 3+V*L*
3 series · 3 of 3 positions shown · non-contrast
Comparison: None.

CLINICAL DATA: Recent fall with injury

EXAM:
LEFT HAND - COMPLETE 3+ VIEW

[x hand pa left]
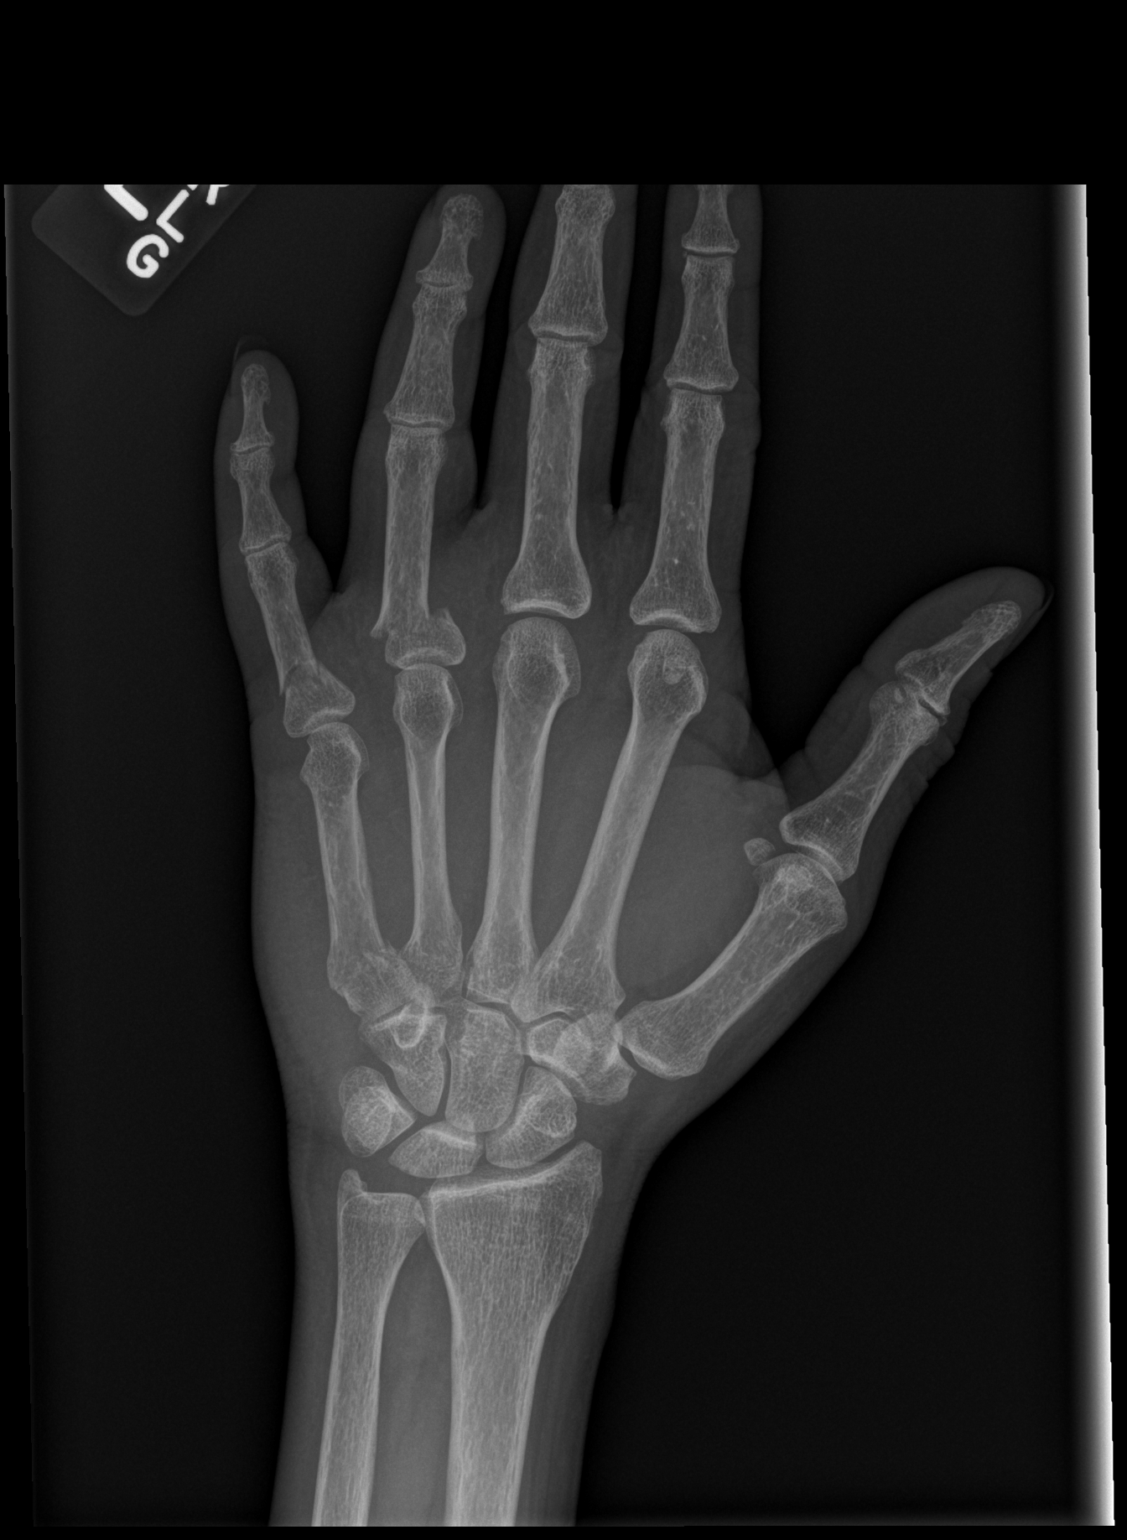

[x hand obl left]
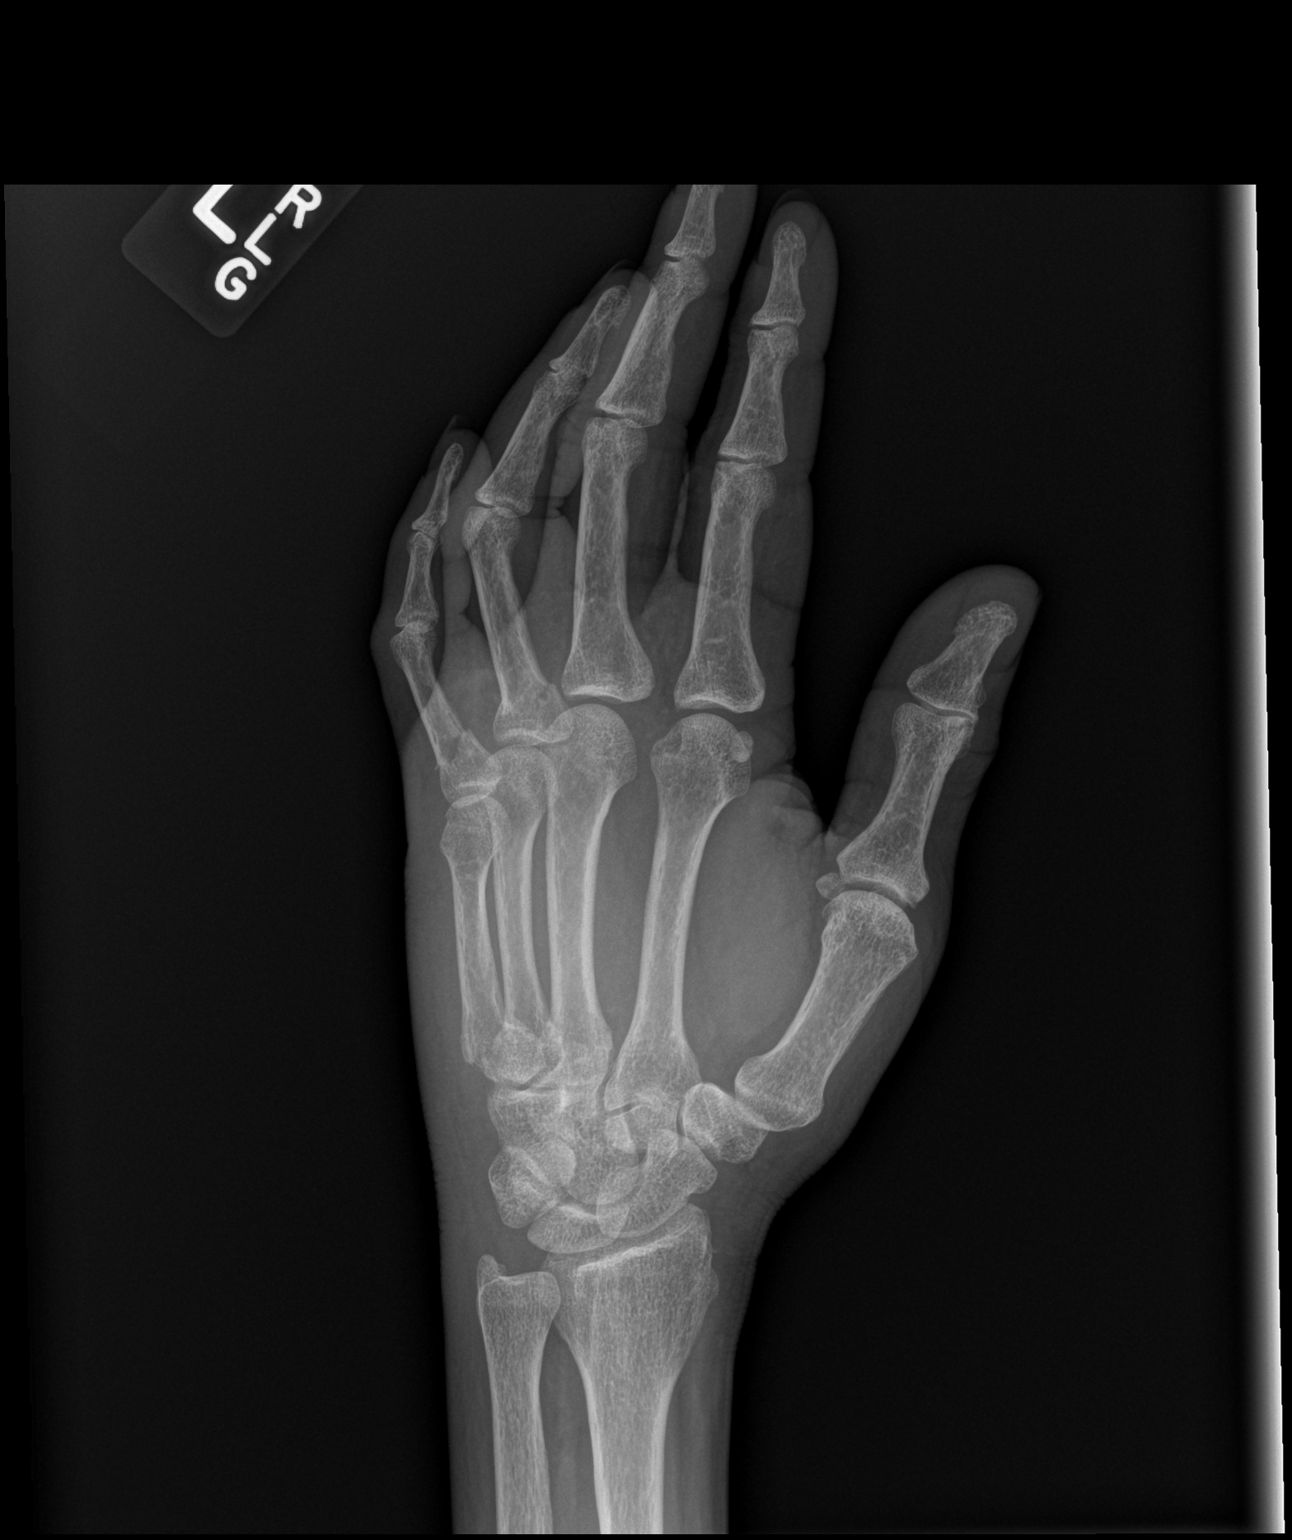

[x hand lat left]
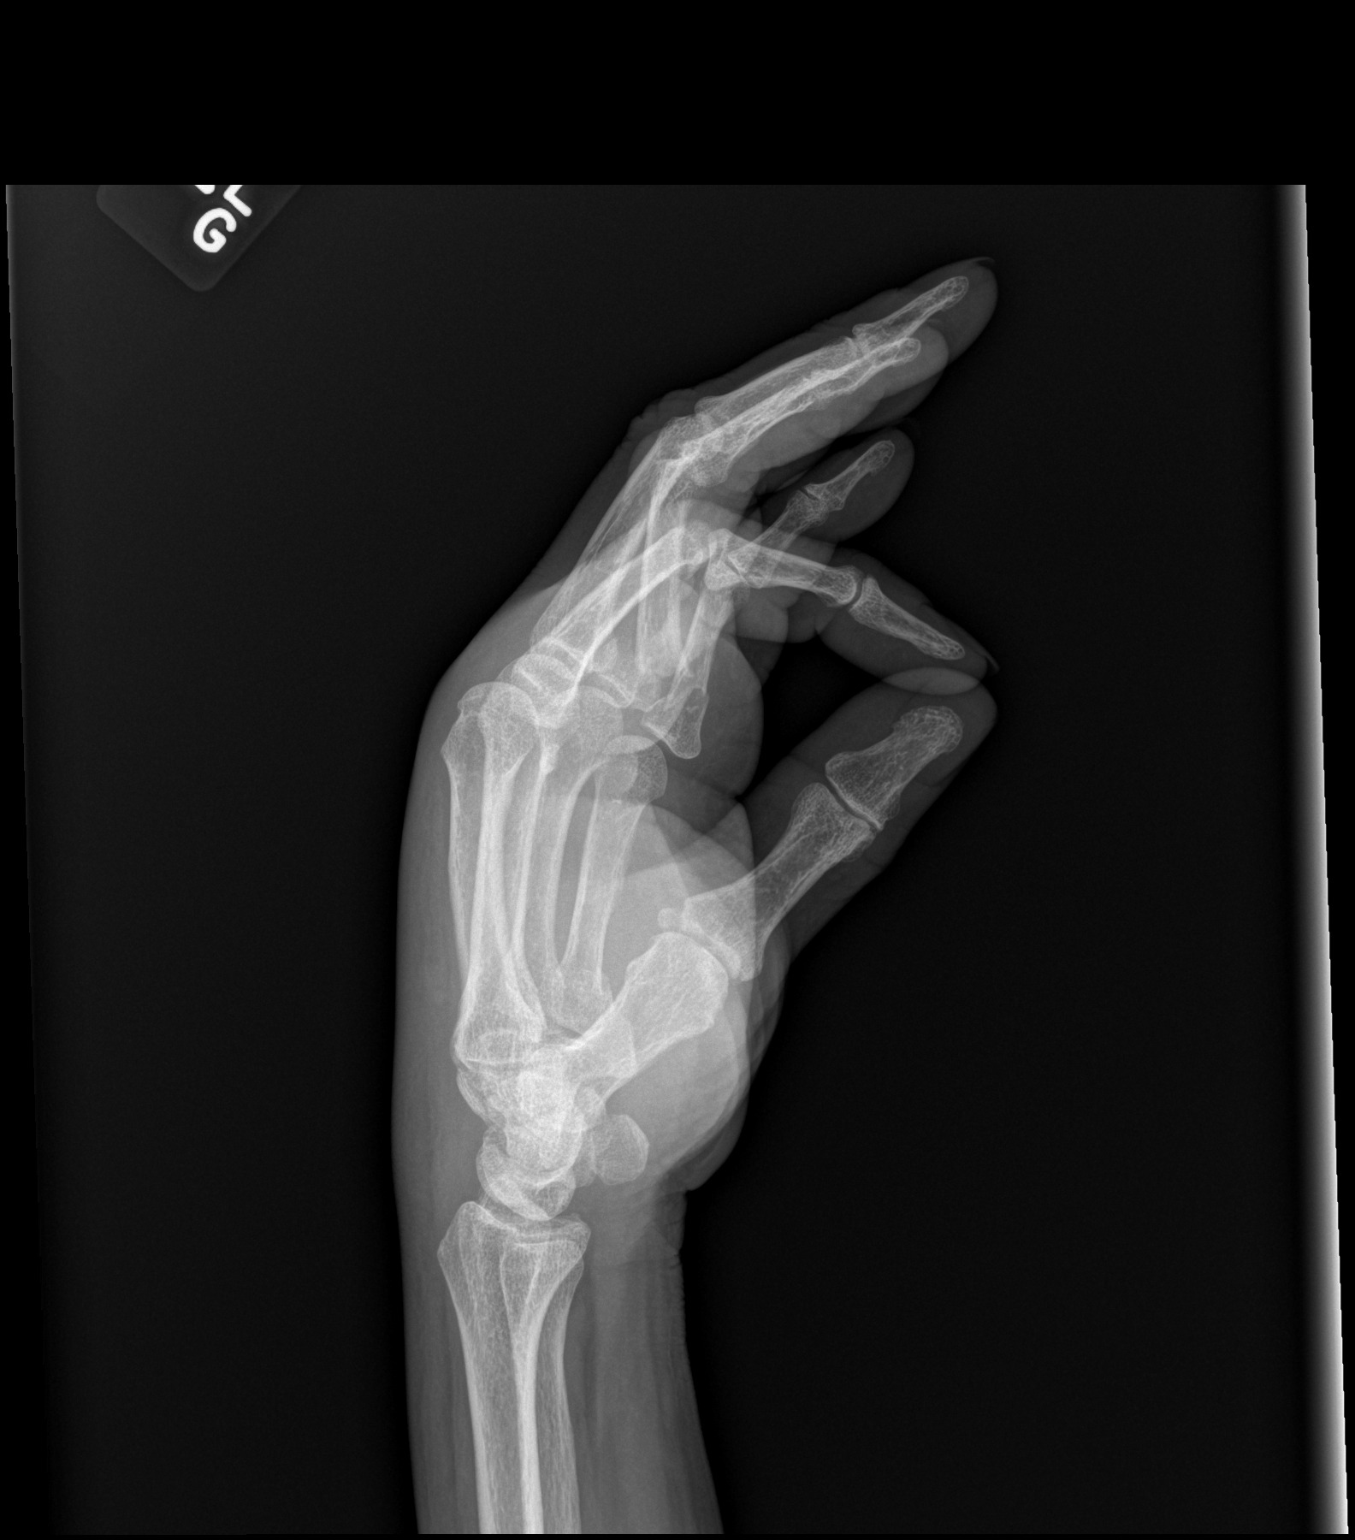

[3 of 3 positions shown; findings below may reference images not displayed]

FINDINGS: There is a transverse fracture through the base of the fourth and
fifth proximal phalanges. There is also a mildly displaced fracture
through the base of the fifth metacarpal. No other fractures are
identified.
IMPRESSION: Fractures of the fifth metacarpal as well as the fourth and fifth
proximal phalanges.

## 2015-08-04 IMAGING — CT CT ABD-PELV W/ CM
2 of 4 series · 16 of 46 positions shown, 18 images · IV contrast (OMNIPAQUE)
Comparison: Abdominal pelvic CT 09/14/2012 and 10/10/2011.

CLINICAL DATA: Rectal cancer diagnosed in 0282. History of
colostomy with completion of chemotherapy. Abdominal pain and
colostomy site.

EXAM:
CT ABDOMEN AND PELVIS WITH CONTRAST
TECHNIQUE: Multidetector CT imaging of the abdomen and pelvis was performed
using the standard protocol following bolus administration of
intravenous contrast.
CONTRAST:  100mL OMNIPAQUE IOHEXOL 300 MG/ML  SOLN

[Series 2: rtn a/p with · axial · 0.74mm/px · z∈[+759,+1159]mm · 13 of 88 slices shown, 15 images]
[im 4/88  soft-tissue]
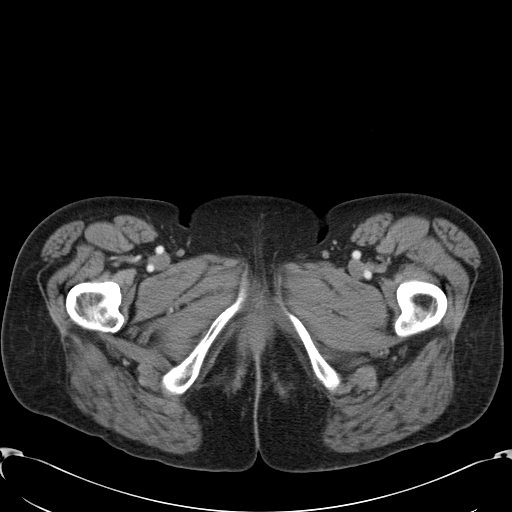
[im 4/88  bone]
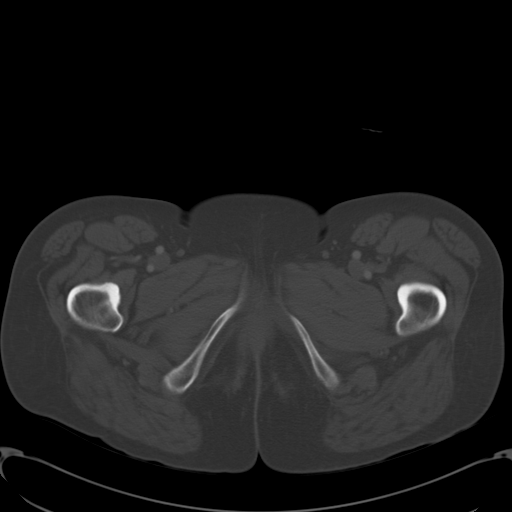
[im 12/88  soft-tissue]
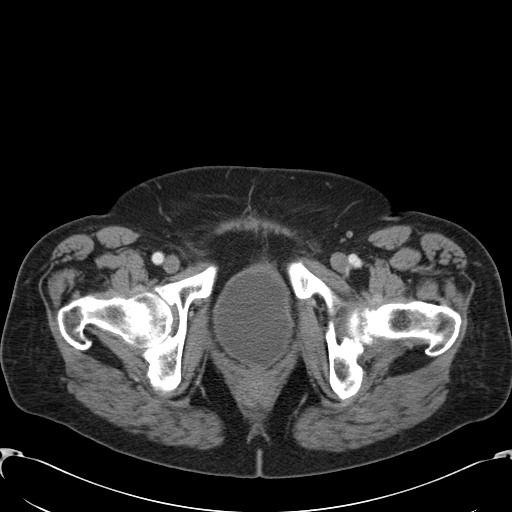
[im 19/88  soft-tissue]
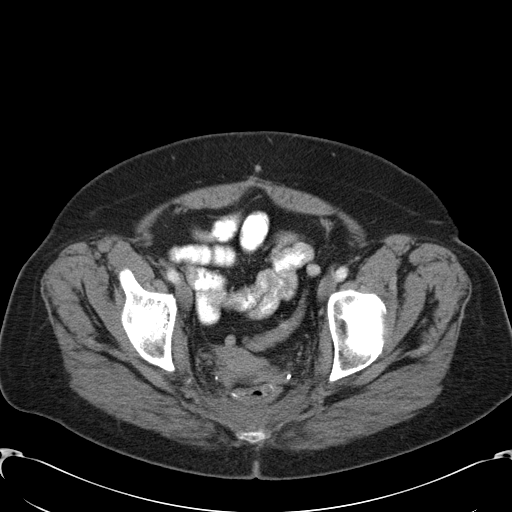
[im 23/88  soft-tissue]
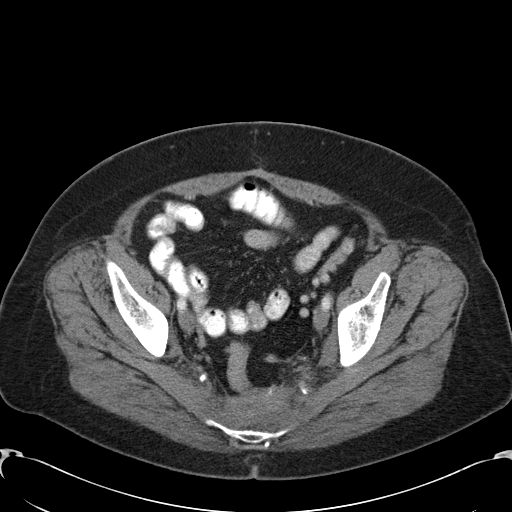
[im 31/88  soft-tissue]
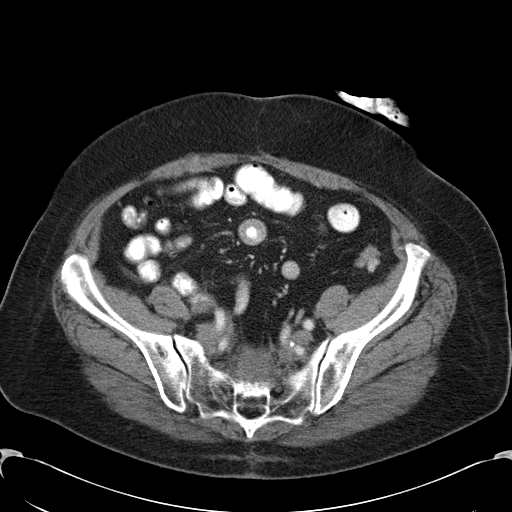
[im 38/88  soft-tissue]
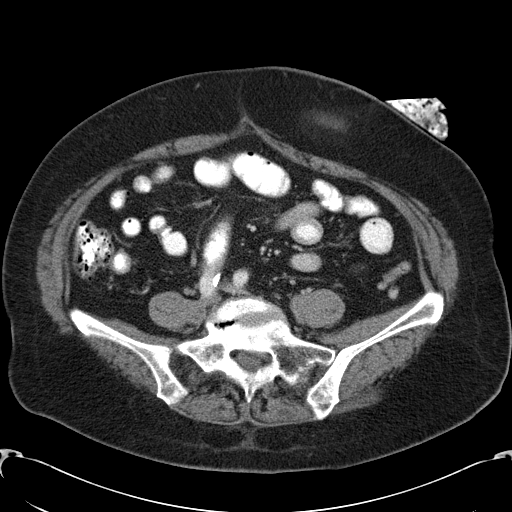
[im 46/88  soft-tissue]
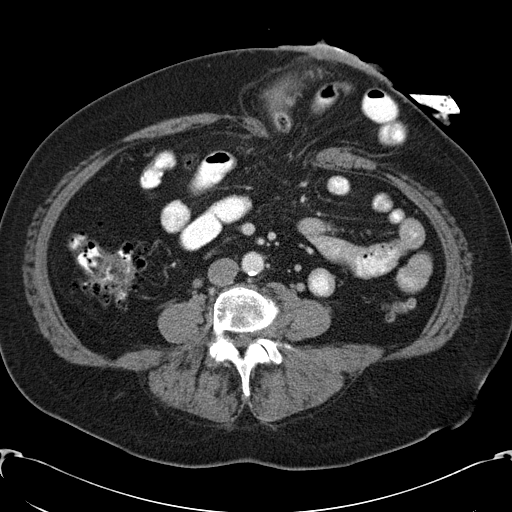
[im 50/88  soft-tissue]
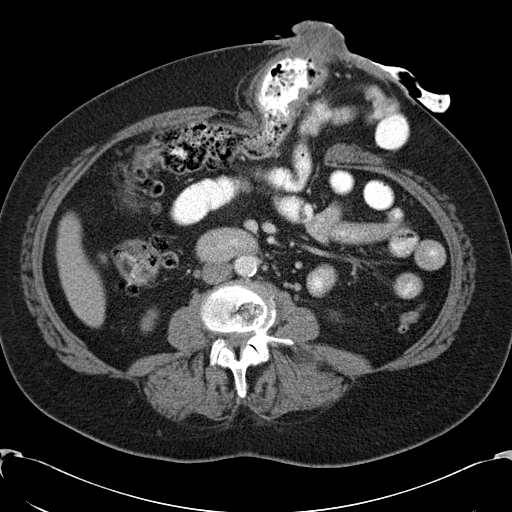
[im 57/88  soft-tissue]
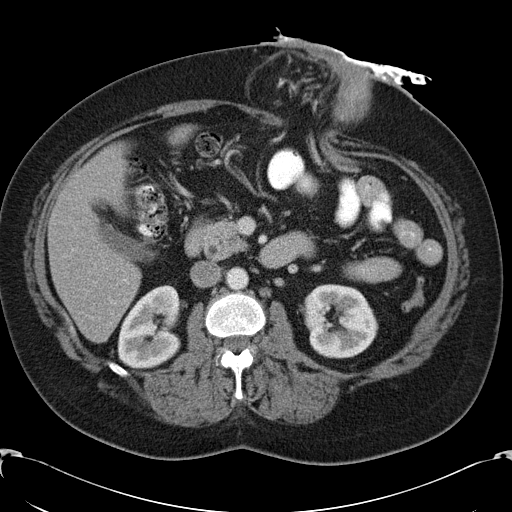
[im 57/88  bone]
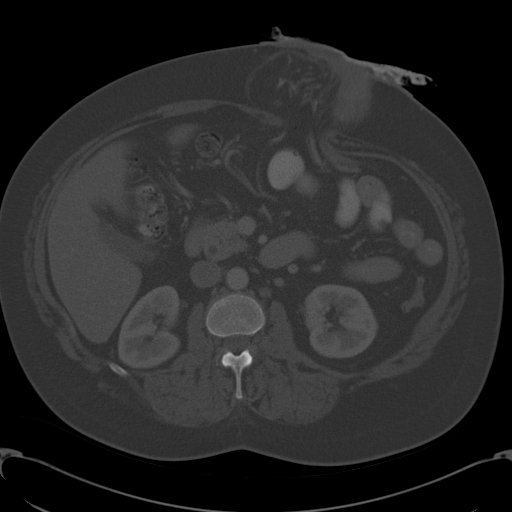
[im 65/88  soft-tissue]
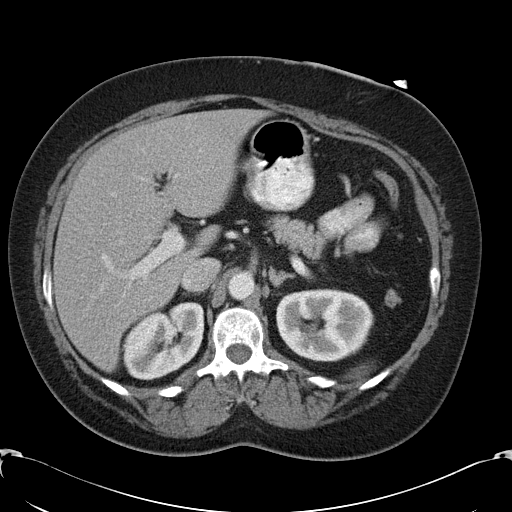
[im 69/88  soft-tissue]
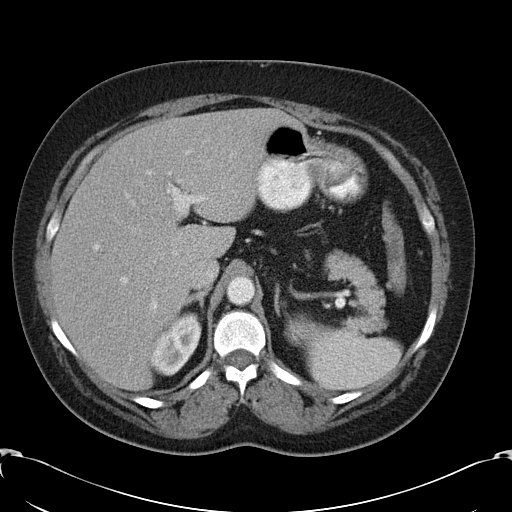
[im 76/88  soft-tissue]
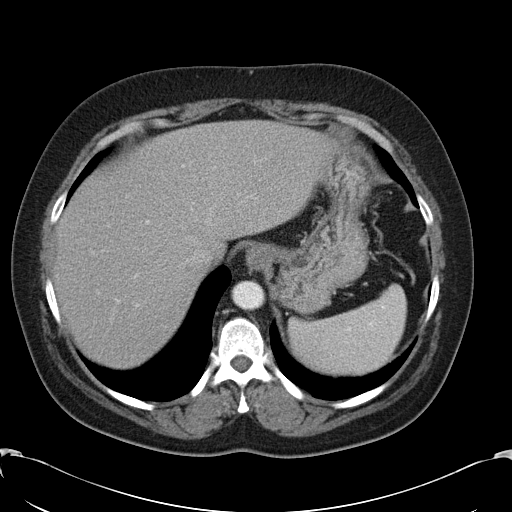
[im 84/88  soft-tissue]
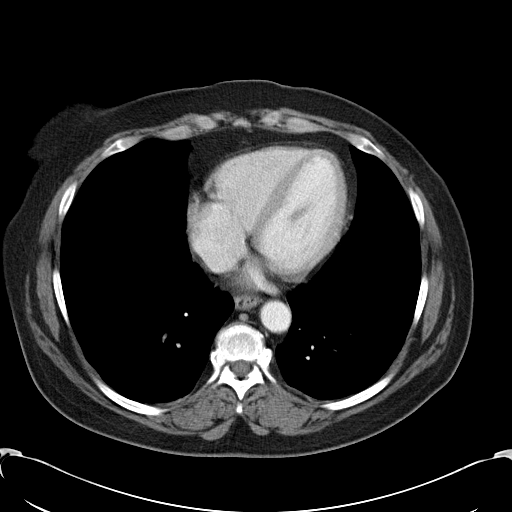

[Series 602: <mpr thick range> · coronal · 0.86mm/px · 3 of 111 slices shown]
[im 37/111  soft-tissue]
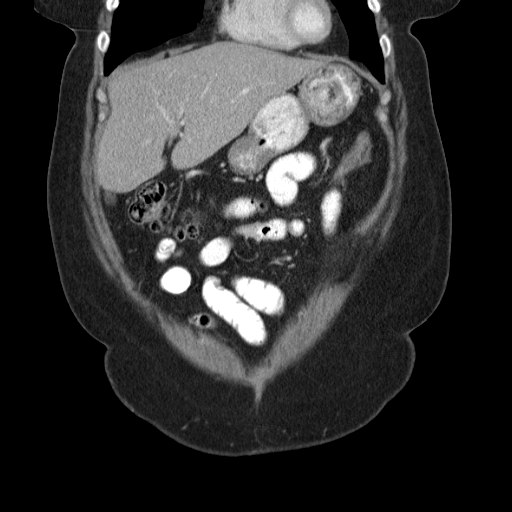
[im 49/111  soft-tissue]
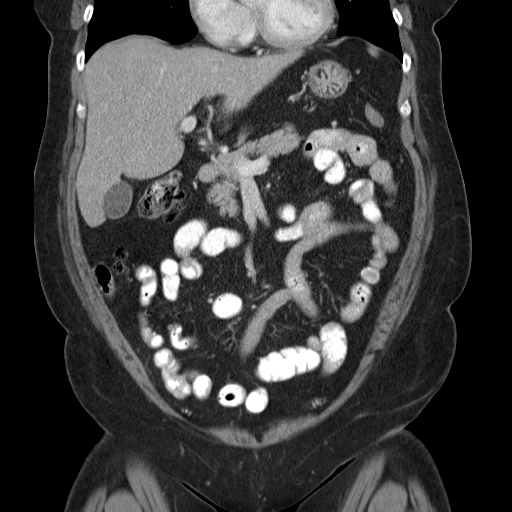
[im 62/111  soft-tissue]
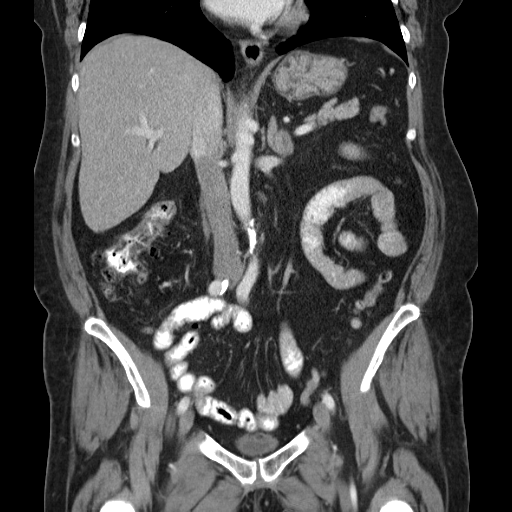

[16 of 46 positions shown; findings below may reference images not displayed]

FINDINGS: The visualized lung bases are clear. There is no significant pleural
or pericardial effusion.

The liver, gallbladder, biliary system, pancreas, spleen, adrenal
glands and kidneys appear normal.

There are stable postsurgical changes status post transverse
colostomy and rectosigmoid resection with anastomosis. There is a
large parastomal hernia associated with the transverse colostomy,
measuring approximately 12 cm transverse. This contains jejunal
loops as before. There is no evidence of incarceration or
obstruction. Diverticular changes throughout the colon are again
noted without surrounding inflammation. The distal colon is
decompressed. The rectosigmoid anastomosis has a stable appearance.
Pre rectal fibrosis and a 2.2 cm low-density lesion on image 65 are
stable, the latter likely reflecting a chronic postoperative fluid
collection. There is no pelvic or retroperitoneal lymphadenopathy.
The appendix appears normal.

Probable atrophic uterus appears stable. There is no adnexal mass.
The bladder appears unremarkable.

No worrisome osseous findings are identified. There are degenerative
changes throughout the lumbar spine with foraminal stenosis greatest
on the right at L5-S1.
IMPRESSION: 1. No evidence of metastatic disease status post distal colon
resection and transverse colostomy.
2. Large parastomal herniation of small bowel adjacent to the
transverse colostomy, not significantly changed from the most recent
study. No evidence of incarceration or bowel obstruction.
3. Stable presacral fibrosis and chronic fluid collection.

## 2015-08-14 IMAGING — CR DG KNEE COMPLETE 4+V*L*
4 series · 4 of 4 positions shown · non-contrast
Comparison: None.

CLINICAL DATA: Status post fall at home striking the knee

EXAM:
LEFT KNEE - COMPLETE 4+ VIEW

[t knee ap left]
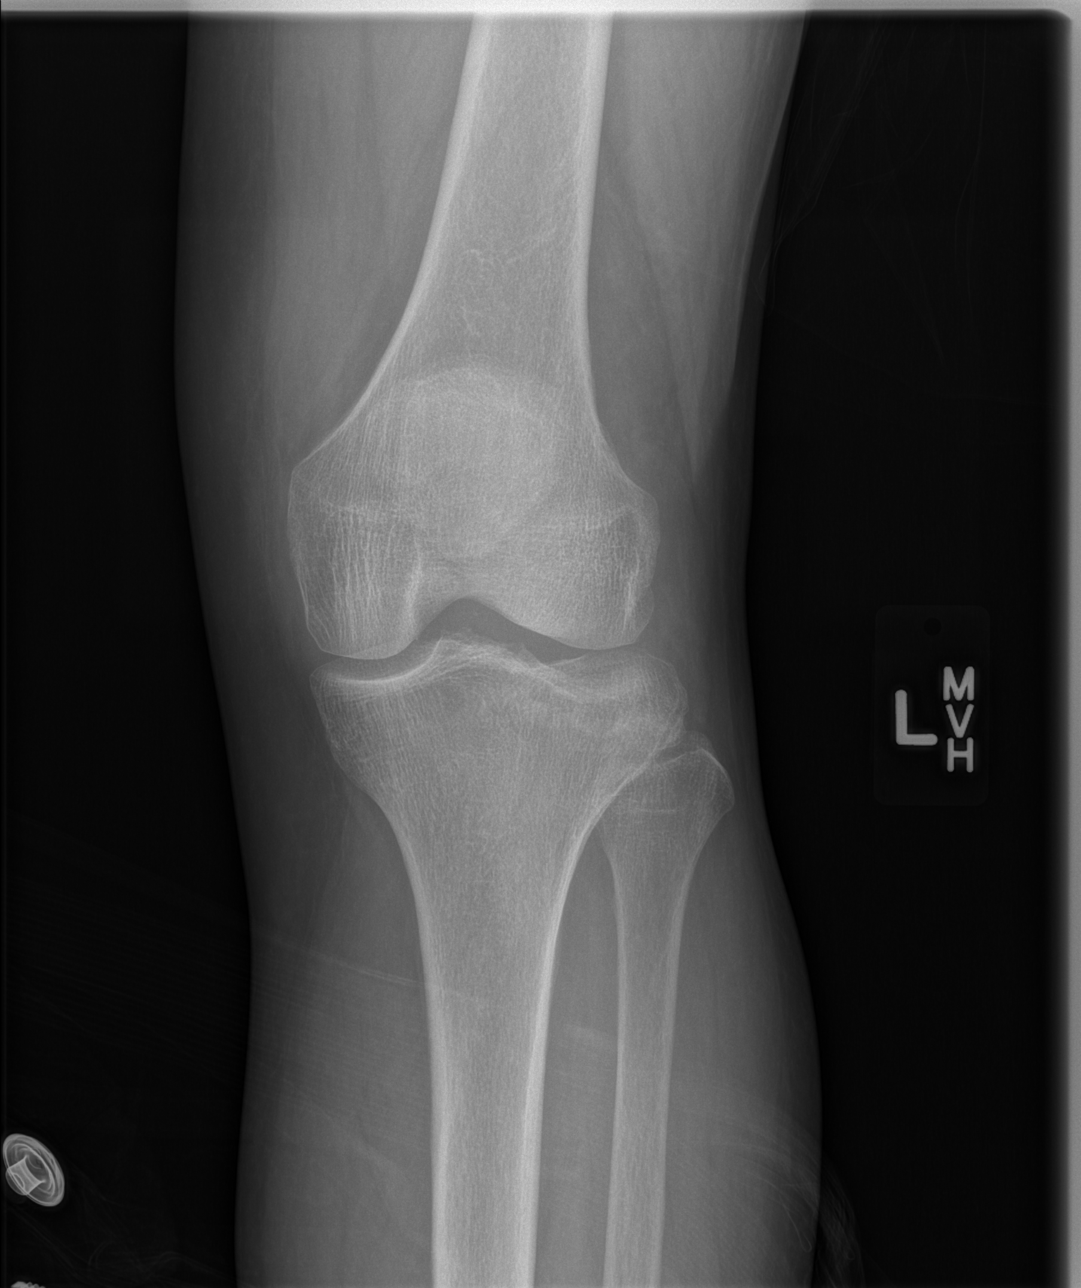

[t knee obl left (1 of 2)]
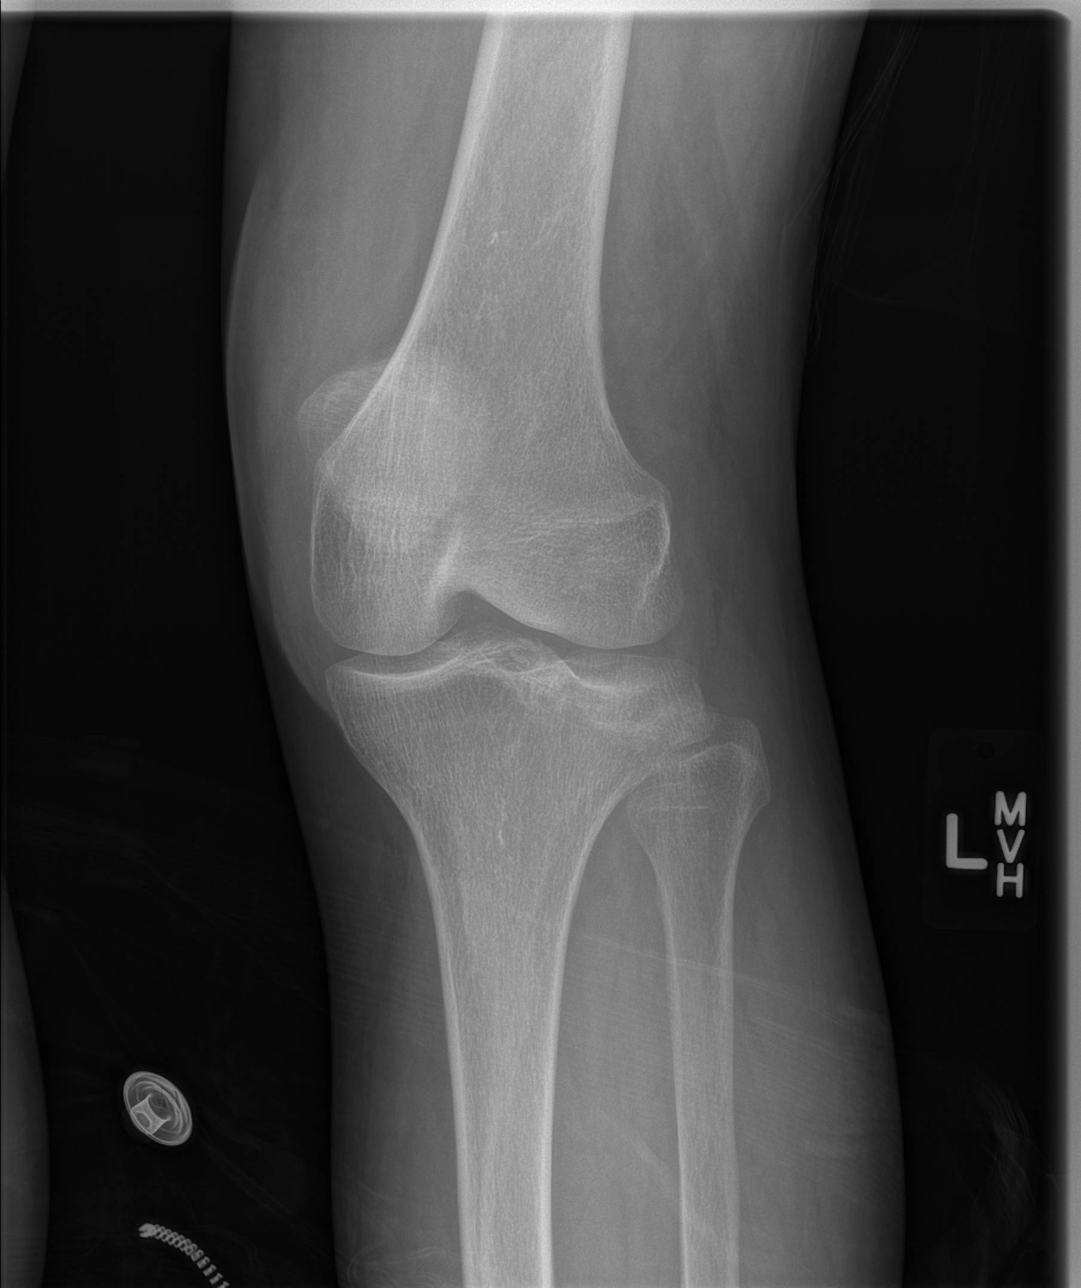

[t knee obl left (2 of 2)]
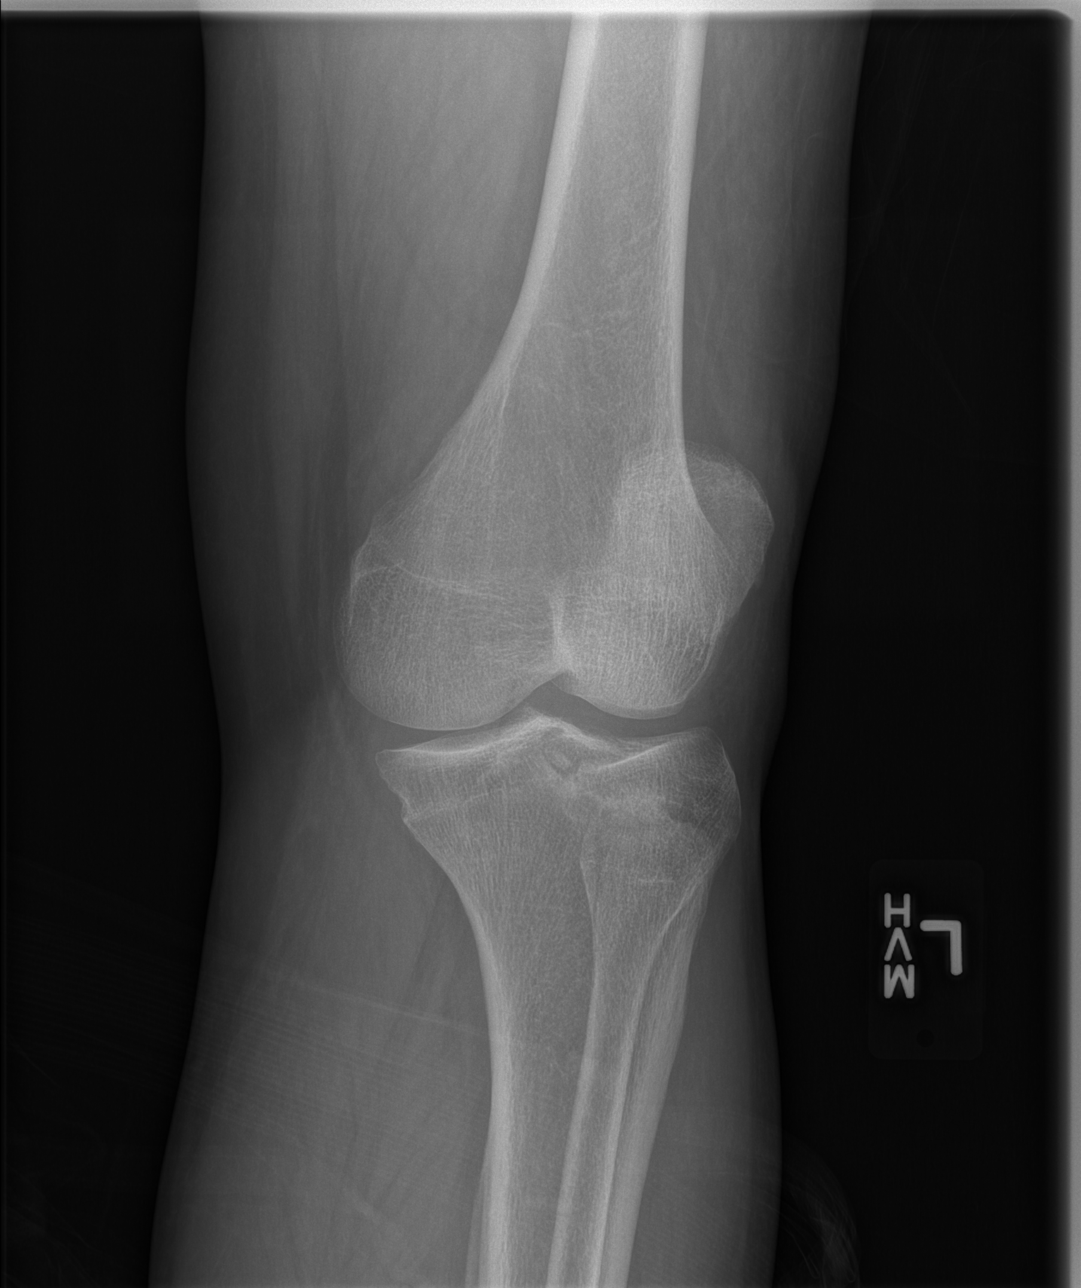

[t knee lat left]
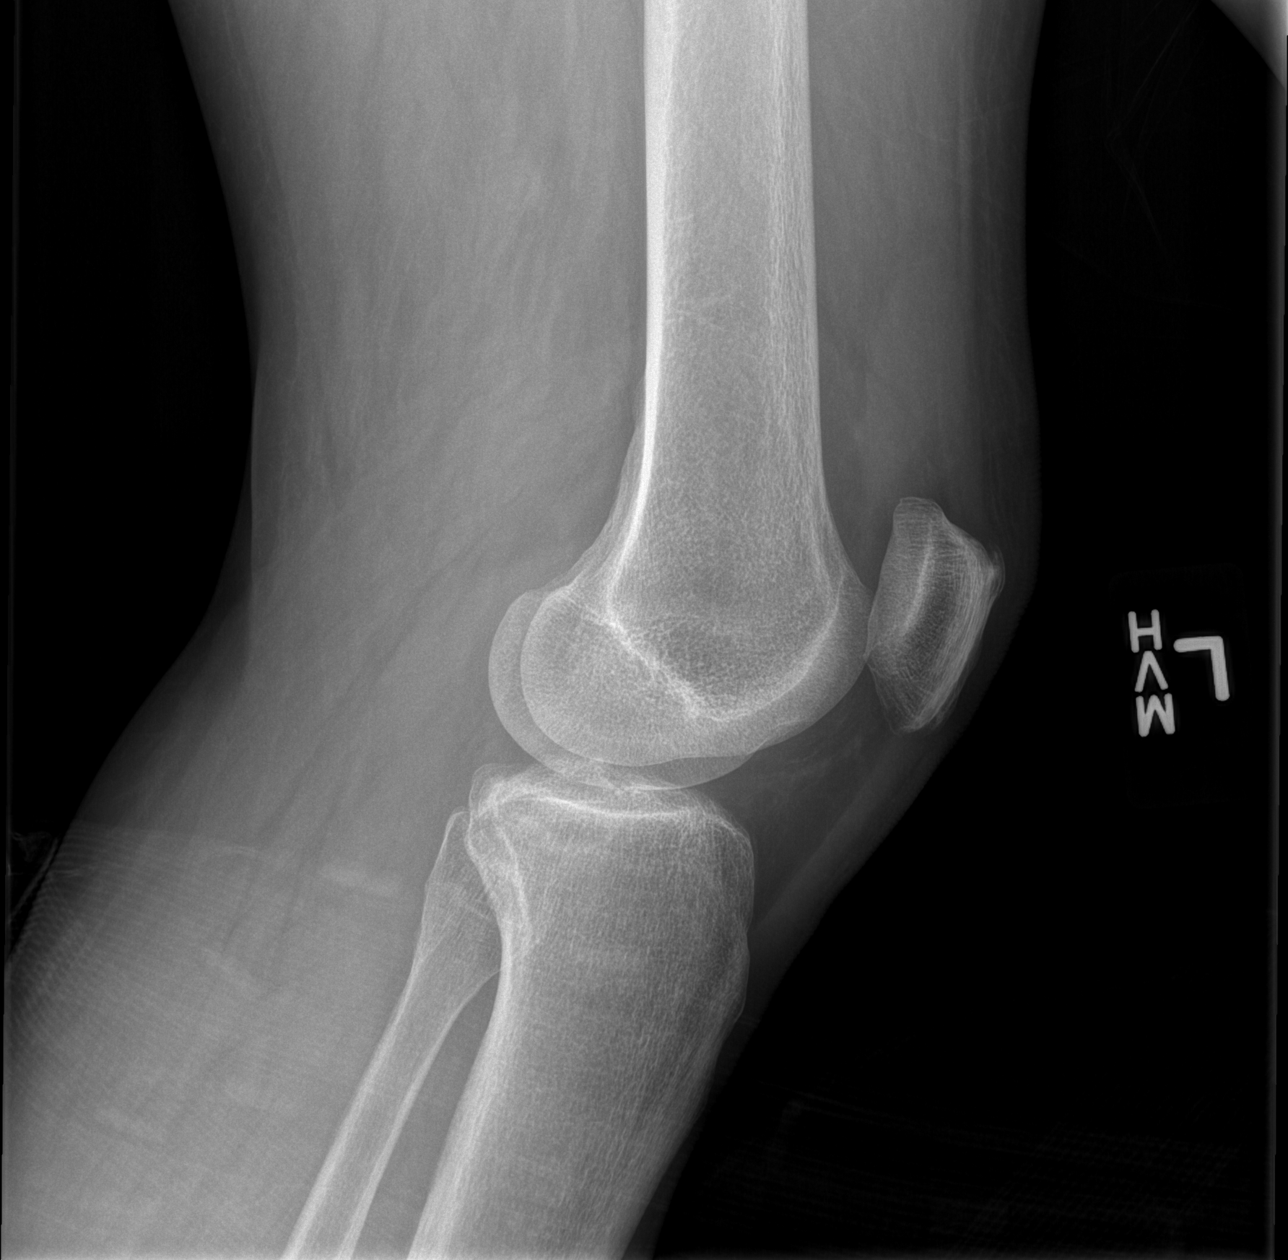

[4 of 4 positions shown; findings below may reference images not displayed]

FINDINGS: There is subtle heterogeneous density projecting over the lateral
tibial plateau worrisome for mildly depressed tibial plateau
fracture. The adjacent fibula is intact. The distal femur and
patella are intact. A knee joint effusion is suspected.
IMPRESSION: A mildly depressed lateral tibial plateau fracture is present.

## 2015-08-16 ENCOUNTER — Telehealth: Payer: Self-pay | Admitting: *Deleted

## 2015-08-16 MED ORDER — HYDROCHLOROTHIAZIDE 25 MG PO TABS: 25.0000 mg | ORAL_TABLET | Freq: Every morning | ORAL | Status: DC

## 2015-08-16 MED ORDER — LISINOPRIL 20 MG PO TABS: 20.0000 mg | ORAL_TABLET | Freq: Every day | ORAL | Status: DC

## 2015-08-16 NOTE — Telephone Encounter (Signed)
Left msg on triage stating she has appt to see Dr. Quay Burow on 6/27, but is currently out of her BP med & anxiety medication. Requesting refill until appt Sent maintenance med pls advise on alprazolam..../lmb

## 2015-08-17 MED ORDER — ALPRAZOLAM 0.5 MG PO TABS: 0.5000 mg | ORAL_TABLET | Freq: Two times a day (BID) | ORAL | Status: DC | PRN

## 2015-08-17 NOTE — Telephone Encounter (Signed)
Notified pt MD ok refills has been sent to [facility]...[name]

## 2015-08-17 NOTE — Telephone Encounter (Signed)
rx printed

## 2015-08-17 NOTE — Addendum Note (Signed)
Addended by: Binnie Rail on: 08/17/2015 09:16 AM   Modules accepted: Orders

## 2015-08-17 NOTE — Telephone Encounter (Signed)
Pls advise if ok to refill alprazolam see msg below...[name]

## 2015-09-01 IMAGING — CT CT ABD-PELV W/O CM
1 series · 15 of 32 positions shown, 19 images · non-contrast
Comparison: 09/09/2013

CLINICAL DATA: Right lower quadrant and right flank pain since
yesterday

EXAM:
CT ABDOMEN AND PELVIS WITHOUT CONTRAST
TECHNIQUE: Multidetector CT imaging of the abdomen and pelvis was performed
following the standard protocol without IV contrast.

[Series 6: sagittal · sagittal · 0.81mm/px · 15 of 128 slices shown, 19 images]
[im 5/128  lung]
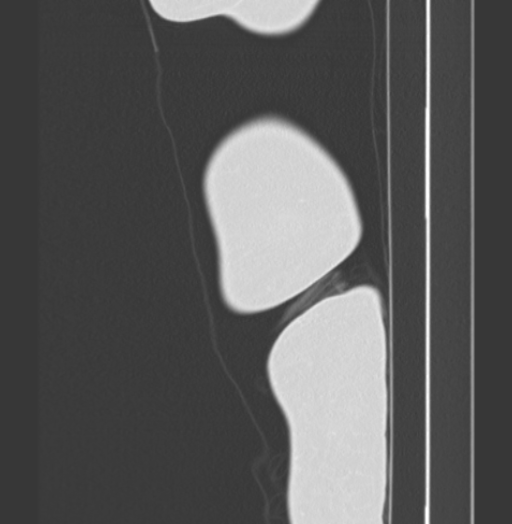
[im 9/128  soft-tissue]
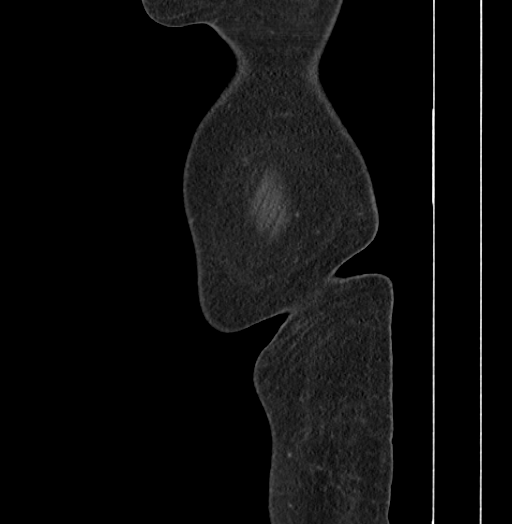
[im 9/128  lung]
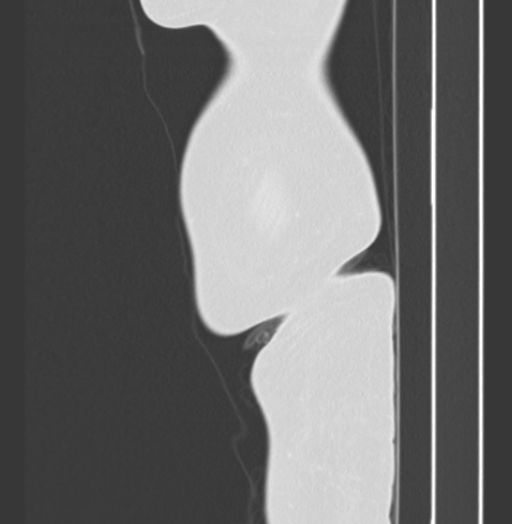
[im 9/128  bone]
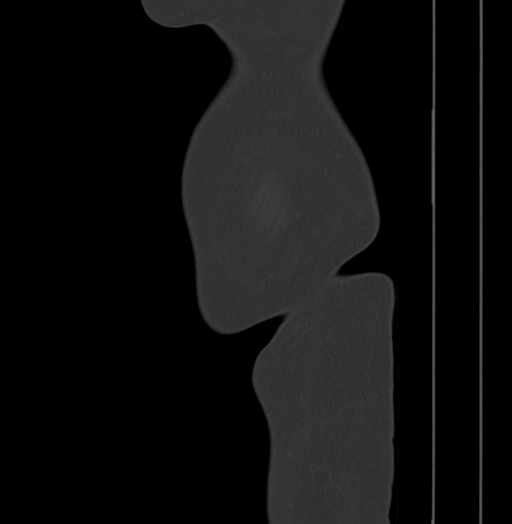
[im 13/128  lung]
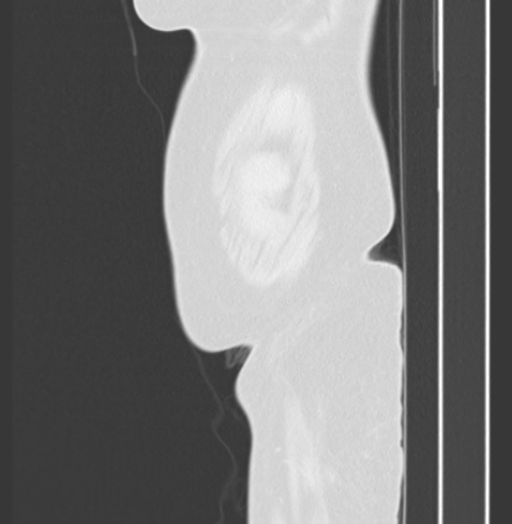
[im 17/128  soft-tissue]
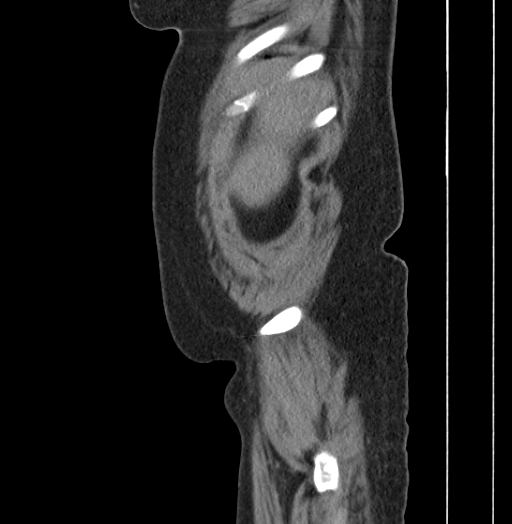
[im 17/128  lung]
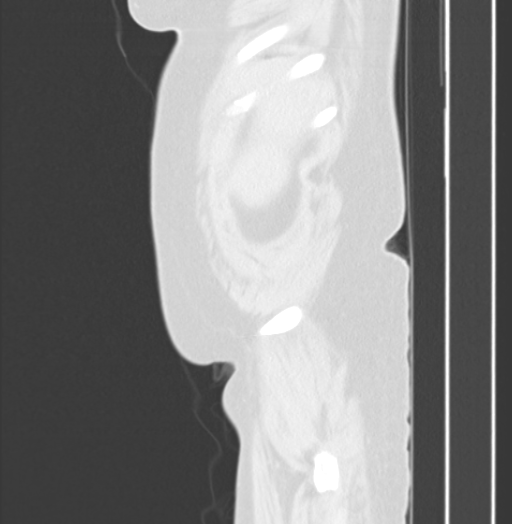
[im 25/128  soft-tissue]
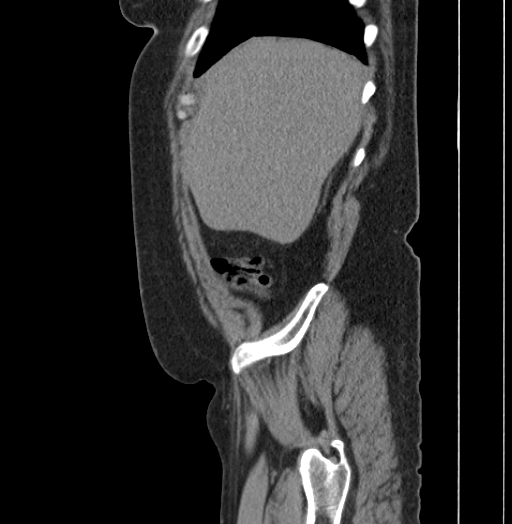
[im 37/128  soft-tissue]
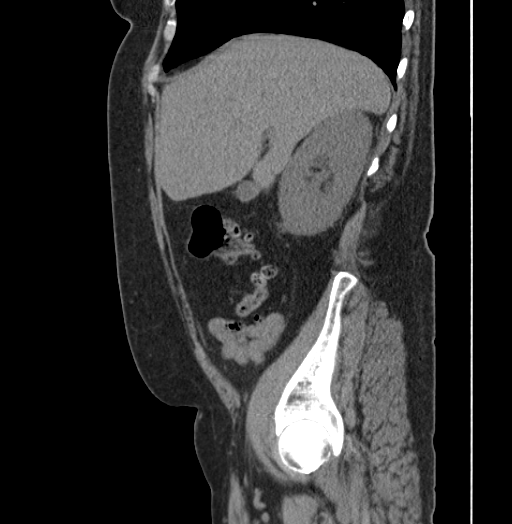
[im 46/128  soft-tissue]
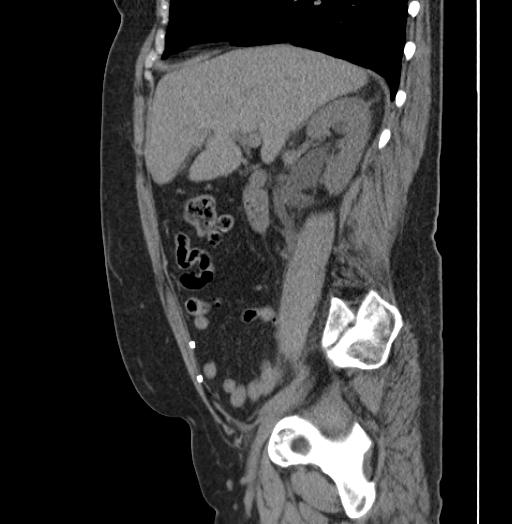
[im 54/128  soft-tissue]
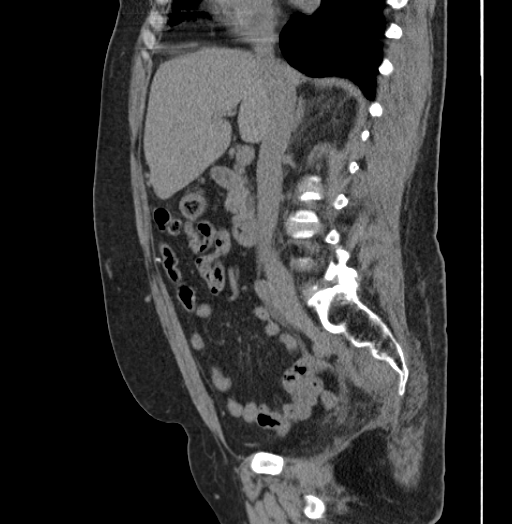
[im 66/128  soft-tissue]
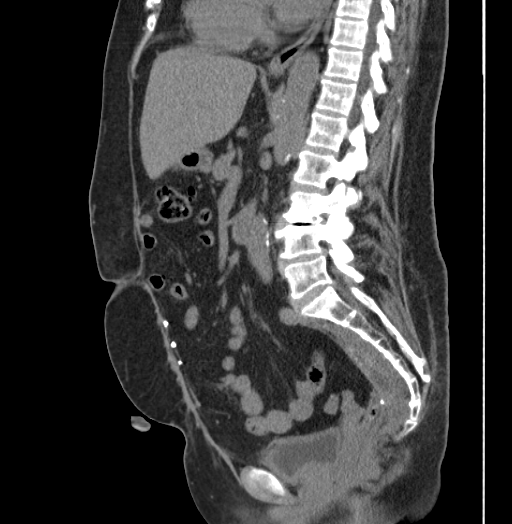
[im 74/128  soft-tissue]
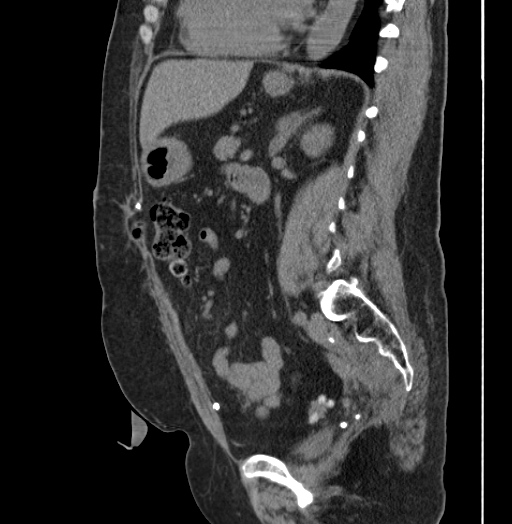
[im 82/128  soft-tissue]
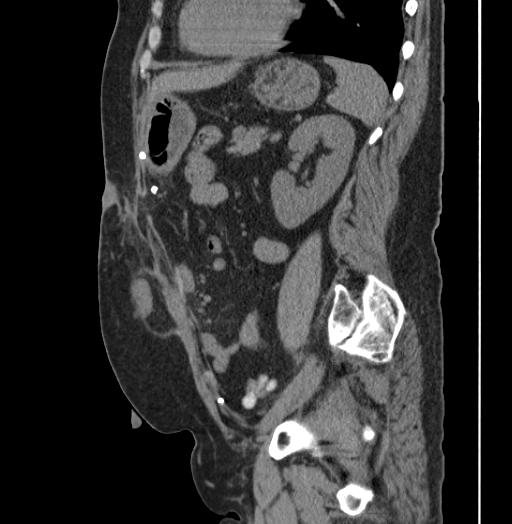
[im 82/128  bone]
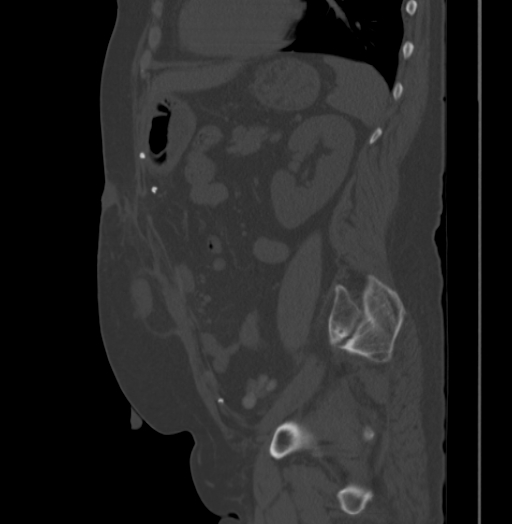
[im 91/128  soft-tissue]
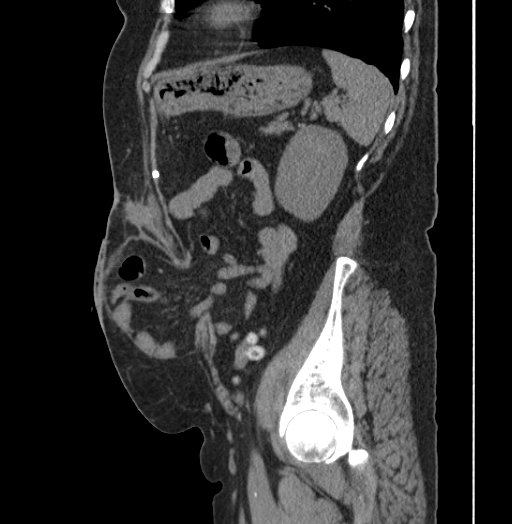
[im 103/128  soft-tissue]
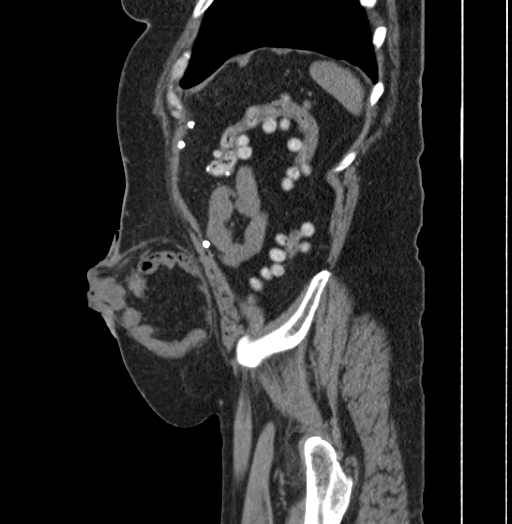
[im 111/128  soft-tissue]
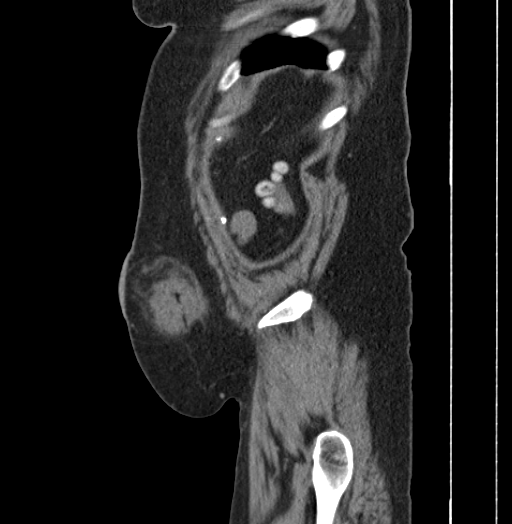
[im 119/128  soft-tissue]
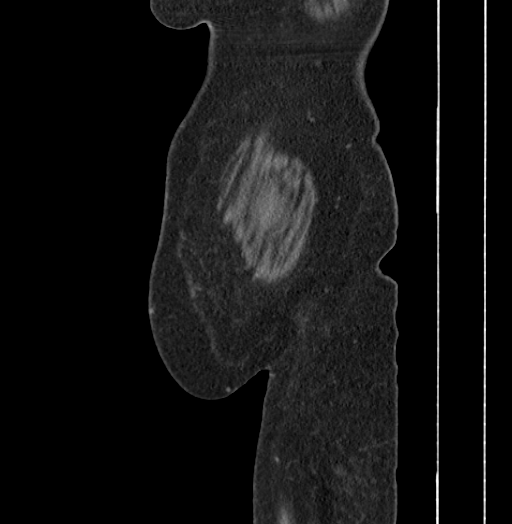

[15 of 32 positions shown; findings below may reference images not displayed]

FINDINGS: The lung bases are free of acute infiltrate or sizable effusion.

The liver, gallbladder spleen, adrenal glands and pancreas are all
normal in their CT appearance. The left kidney shows no renal
calculi or obstructive changes.

The right kidney demonstrates moderate hydronephrosis extending to
the level of the ureterovesical junction. A small 1-2 mm stone is
noted at the right UVJ causing the obstructive change. The bladder
is partially distended.

Diffuse diverticular change of the colon is noted. The appendix is
well visualized and within normal limits. A left anterior abdominal
wall ostomy is noted. Multiple herniated loops of small bowel are
now noted adjacent to the ostomy site although no incarceration is
seen. Persistent presacral changes are noted. Hartman's pouch is
again identified. No significant lymphadenopathy is seen. The
osseous structures are within normal limits for the patient's given
age.
IMPRESSION: Distal right ureteral stone with obstructive change as described.

New herniated small bowel loops adjacent to the patient's known
left-sided ostomy. No incarceration is noted.

Chronic changes as described stable from the previous exam.

## 2015-10-09 ENCOUNTER — Encounter: Payer: Self-pay | Admitting: Internal Medicine

## 2015-10-09 ENCOUNTER — Other Ambulatory Visit (INDEPENDENT_AMBULATORY_CARE_PROVIDER_SITE_OTHER): Payer: Medicare Other

## 2015-10-09 ENCOUNTER — Ambulatory Visit (INDEPENDENT_AMBULATORY_CARE_PROVIDER_SITE_OTHER): Payer: Medicare Other | Admitting: Internal Medicine

## 2015-10-09 VITALS — BP 154/96 | HR 82 | Temp 97.8°F | Resp 16 | Ht 66.0 in | Wt 169.0 lb

## 2015-10-09 DIAGNOSIS — I10 Essential (primary) hypertension: Secondary | ICD-10-CM

## 2015-10-09 DIAGNOSIS — IMO0002 Reserved for concepts with insufficient information to code with codable children: Secondary | ICD-10-CM

## 2015-10-09 DIAGNOSIS — R5383 Other fatigue: Secondary | ICD-10-CM

## 2015-10-09 DIAGNOSIS — R739 Hyperglycemia, unspecified: Secondary | ICD-10-CM

## 2015-10-09 DIAGNOSIS — Z780 Asymptomatic menopausal state: Secondary | ICD-10-CM

## 2015-10-09 DIAGNOSIS — Z85048 Personal history of other malignant neoplasm of rectum, rectosigmoid junction, and anus: Secondary | ICD-10-CM

## 2015-10-09 DIAGNOSIS — D649 Anemia, unspecified: Secondary | ICD-10-CM

## 2015-10-09 DIAGNOSIS — Z114 Encounter for screening for human immunodeficiency virus [HIV]: Secondary | ICD-10-CM

## 2015-10-09 DIAGNOSIS — Z1159 Encounter for screening for other viral diseases: Secondary | ICD-10-CM | POA: Diagnosis not present

## 2015-10-09 DIAGNOSIS — G622 Polyneuropathy due to other toxic agents: Secondary | ICD-10-CM | POA: Diagnosis not present

## 2015-10-09 LAB — COMPREHENSIVE METABOLIC PANEL
ALT: 27 U/L (ref 0–35)
AST: 24 U/L (ref 0–37)
Albumin: 4.4 g/dL (ref 3.5–5.2)
Alkaline Phosphatase: 102 U/L (ref 39–117)
BUN: 18 mg/dL (ref 6–23)
CO2: 26 mEq/L (ref 19–32)
Calcium: 9.7 mg/dL (ref 8.4–10.5)
Chloride: 107 mEq/L (ref 96–112)
Creatinine, Ser: 0.82 mg/dL (ref 0.40–1.20)
GFR: 91.66 mL/min (ref >60.00)
Glucose, Bld: 112 mg/dL — ABNORMAL HIGH (ref 70–99)
Potassium: 3.7 mEq/L (ref 3.5–5.1)
Sodium: 139 mEq/L (ref 135–145)
Total Bilirubin: 0.5 mg/dL (ref 0.2–1.2)
Total Protein: 7.7 g/dL (ref 6.0–8.3)

## 2015-10-09 LAB — CBC WITH DIFFERENTIAL/PLATELET
Basophils Absolute: 0 10*3/uL (ref 0.0–0.1)
Basophils Relative: 0.3 % (ref 0.0–3.0)
Eosinophils Absolute: 0.1 10*3/uL (ref 0.0–0.7)
Eosinophils Relative: 1.1 % (ref 0.0–5.0)
HCT: 40.8 % (ref 36.0–46.0)
Hemoglobin: 13.5 g/dL (ref 12.0–15.0)
Lymphocytes Relative: 23.8 % (ref 12.0–46.0)
Lymphs Abs: 2.1 10*3/uL (ref 0.7–4.0)
MCHC: 33.2 g/dL (ref 30.0–36.0)
MCV: 92.2 fl (ref 78.0–100.0)
Monocytes Absolute: 0.4 10*3/uL (ref 0.1–1.0)
Monocytes Relative: 5 % (ref 3.0–12.0)
Neutro Abs: 6.1 10*3/uL (ref 1.4–7.7)
Neutrophils Relative %: 69.8 % (ref 43.0–77.0)
Platelets: 345 10*3/uL (ref 150.0–400.0)
RBC: 4.42 Mil/uL (ref 3.87–5.11)
RDW: 14.6 % (ref 11.5–15.5)
WBC: 8.8 10*3/uL (ref 4.0–10.5)

## 2015-10-09 LAB — FERRITIN: Ferritin: 108.6 ng/mL (ref 10.0–291.0)

## 2015-10-09 LAB — TSH: TSH: 0.82 u[IU]/mL (ref 0.35–4.50)

## 2015-10-09 LAB — IRON: Iron: 80 ug/dL (ref 42–145)

## 2015-10-09 LAB — HEMOGLOBIN A1C: Hgb A1c MFr Bld: 6.4 % (ref 4.6–6.5)

## 2015-10-09 MED ORDER — ALPRAZOLAM 0.5 MG PO TABS: 0.5000 mg | ORAL_TABLET | Freq: Two times a day (BID) | ORAL | Status: DC | PRN

## 2015-10-09 MED ORDER — HYDROCHLOROTHIAZIDE 25 MG PO TABS: 25.0000 mg | ORAL_TABLET | Freq: Every morning | ORAL | Status: DC

## 2015-10-09 MED ORDER — DULOXETINE HCL 20 MG PO CPEP: 20.0000 mg | ORAL_CAPSULE | Freq: Every day | ORAL | Status: DC

## 2015-10-09 NOTE — Progress Notes (Signed)
Pre visit review using our clinic review tool, if applicable. No additional management support is needed unless otherwise documented below in the visit note. 

## 2015-10-09 NOTE — Assessment & Plan Note (Signed)
Check a1c 

## 2015-10-09 NOTE — Progress Notes (Signed)
Subjective:    Patient ID: [name], female    DOB: Jan 19, 1956, 60 y.o.   MRN: [id number]  HPI She is here to establish with a new pcp.  She is here for follow up.  Hypertension: She is taking her medication daily - she takes the hctz daily, but takes the lisinopril only as needed if her BP is high.  She did not take the hctz today and has not needed to take the lisinopril recently. She is compliant with a low sodium diet.  She denies chest pain, palpitations, edema, shortness of breath and regular headaches.  She does monitor her blood pressure at home, 130/low 90's - takes lisinorpil only as needed.    Histotry of rectal cancer, s/p colostomy:  She was diagnosed in 2010.  She then had radiation and chemo.  She surgery and then had chemo again.  She had fecal incontinece and had a colostomy placed.  The first one prolapsed and she had to have a new colostomy placed.  The first one was removed.  Last September she had a colonoscopy and her colon was punctured and she had another colostomy placed.  The second was removed.  She takes naproxen for abdominal discomfort.    Neuropathy, feet and hands: she was on the highest dose of gabapentin and it did not help.  The Lyrica did not help.    Insomnia:  She was placed on amitriptyline and xanax.   Anxiety:  She takes one xanax during the day and one at night.    Medications and allergies reviewed with patient and updated if appropriate.  Patient Active Problem List   Diagnosis Date Noted  . CAP (community acquired pneumonia) 04/10/2015  . Colon perforation ([place]) 01/10/2015  . Bursitis of right shoulder 01/19/2014  . Tibial plateau fracture, left 01/19/2014  . Multiple falls 01/19/2014  . Colostomy malfunction ([place]) 12/09/2012  . Peristomal hernia 04/19/2012  . Depression   . Seborrheic dermatitis 08/09/2010  . PERSONAL HX RECTAL CANCER 06/05/2010  . SMOKER 04/16/2010  . ALLERGIC RHINITIS 04/16/2010  . UNSPECIFIED ANEMIA  12/06/2009  . 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 Essential hypertension 12/06/2009  . FATIGUE 12/06/2009  . DIARRHEA 12/06/2009    Current Outpatient Prescriptions on File Prior to Visit  Medication Sig Dispense Refill  . ALPRAZolam (XANAX) 0.5 MG tablet Take 1 tablet (0.5 mg total) by mouth 2 (two) times daily as needed for anxiety or sleep. 40 tablet 1  . amitriptyline (ELAVIL) 25 MG tablet Take 1 tablet (25 mg total) by mouth at bedtime as needed for sleep. 30 tablet 5  . gabapentin (NEURONTIN) 400 MG capsule Take 3 capsules (1,200 mg total) by mouth 3 (three) times daily. 270 capsule 5  . hydrochlorothiazide (HYDRODIURIL) 25 MG tablet Take 1 tablet (25 mg total) by mouth every morning. Must keep June appt w/new PCP for refills 30 tablet 1  . lisinopril (PRINIVIL,ZESTRIL) 20 MG tablet Take 1 tablet (20 mg total) by mouth daily. Must keep June appt w/new PCP for future refills 30 tablet 1  . naproxen sodium (ANAPROX) 220 MG tablet Take 440 mg by mouth 2 (two) times daily as needed (pain).     No current facility-administered medications on file prior to visit.    Past Medical History  Diagnosis Date  . 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 Hypertension   . Diverticulosis   . Radiation proctitis     mild  . Adenomatous  colon polyp   . Anemia, unspecified   . Depression   . Nonspecific colitis 10/10/11  . Cocaine abuse     as recently as 10/21/11  . Hernia 03/18/2013  . Incontinence of bowel     since colostomy  . History of transfusion   . Difficulty sleeping     takes meds to sleep  . Knee fracture, left   . 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 Mouth surgery  10/2009  . Low anterior bowel resection  03/07/09  . Colostomy  2013    [facility]  . Portacath placement      and removal  . Laparoscopic parastomal hernia N/A 05/12/2013    Procedure: LAPAROSCOPIC  PARASTOMAL HERNIA;  Surgeon: Leighton Ruff, MD;  Location: [facility];  Service: General;  Laterality: N/A;  . Hernia repair    . Finger fracture surgery Left     and hand surgery  . Laparotomy N/A 01/09/2015    Procedure: EXPLORATORY LAPAROTOMY;  Surgeon: Excell Seltzer, MD;  Location: [facility];  Service: General;  Laterality: N/A;  RESECTION OF COLOSTOMY, CREATION OF NEW COLOSTOMY, VENTRAL HERNIA REPAIR WITH BIOLOGIC MESH,  LYSIS OF ADHESIONS , OMENTECTOMY  . Appendectomy N/A 01/09/2015    Procedure: APPENDECTOMY;  Surgeon: Excell Seltzer, MD;  Location: [facility];  Service: General;  Laterality: N/A;    Social History   Social History  . Marital Status: Divorced    Spouse Name: N/A  . Number of Children: 0  . Years of Education: N/A   Occupational History  . disabiled    Social History Main Topics  . Smoking status: Current Every Day Smoker -- 0.25 packs/day for 40 years    Types: Cigarettes  . Smokeless tobacco: Never Used  . Alcohol Use: 0.0 oz/week    0 Standard drinks or equivalent per week     Comment: occasional  driink  . Drug Use: Yes    Special: Marijuana, Cocaine     Comment: no longer using Marijuana; last use of cocaine nov 2014   . Sexual Activity: Not Asked   Other Topics Concern  . 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 Colon cancer Mother   . Stomach cancer Sister   . Colon polyps Sister   . Diabetes Father   . Cirrhosis Brother   . Pulmonary Hypertension Brother     Review of Systems  Constitutional: Positive for fatigue. Negative for fever and chills.  Respiratory: Negative for cough, shortness of breath and wheezing.   Cardiovascular: Negative for chest pain, palpitations and leg swelling.  Gastrointestinal: Positive for abdominal pain (discomfort at surgical  sites) and blood in stool (almost never).       No gerd  Genitourinary: Negative for dysuria and hematuria.  Neurological: Positive for numbness (neuropathy). Negative for dizziness, light-headedness and headaches.  Psychiatric/Behavioral: Positive for dysphoric mood. The patient is nervous/anxious.        Objective:   Filed Vitals:   10/09/15 1540  BP: 154/96  Pulse: 82  Temp: 97.8 F (36.6 C)  Resp: 16   Filed Weights   10/09/15 1540  Weight: 169 lb (76.658 kg)   Body mass index is  27.29 kg/(m^2).   Physical Exam Constitutional: Appears well-developed and well-nourished. No distress.  Neck: Neck supple. No tracheal deviation present. No thyromegaly present.  No carotid bruit. No cervical adenopathy.   Cardiovascular: Normal rate, regular rhythm and normal heart sounds.   No murmur heard.  No edema Pulmonary/Chest: Effort normal and breath sounds normal. No respiratory distress. No wheezes.  Psych: normal mood and affect.       Assessment & Plan:   See Problem List for Assessment and Plan of chronic medical problems.  F/u in 3 months

## 2015-10-09 NOTE — Assessment & Plan Note (Signed)
Check CEA Last colonoscopy 2016 Would like to see oncology again - last saw the 2015 - will refer Following with GI

## 2015-10-09 NOTE — Assessment & Plan Note (Signed)
Check cbc, iron levels

## 2015-10-09 NOTE — Assessment & Plan Note (Signed)
Check labs Start 1000 mcg B12 daily Start 1000 units vitamin D daily

## 2015-10-09 NOTE — Assessment & Plan Note (Signed)
BP elevated here - did not take hctz Has only been taking lisinopril prn for elevated BP Continue to monitor - BP goal less than 140/90 cmp today May need daily low dose lisinopril 5 mg daily F/u in 3 months to re-evaluate

## 2015-10-09 NOTE — Patient Instructions (Addendum)
Neuropathy -- Supplements you can try include:  Acetyl - L- Carnitine, alpha lipoic acid, Co Q10, magnesium, evening primrose oil, gamma linolenic acid   We will try adding cymbalta to see if that helps with your neuropathy.   Have blood work done today.   BP should be less than 140/90.   Follow up in 3 months

## 2015-10-09 NOTE — Assessment & Plan Note (Signed)
Gabapentin at highest dose and lyrica not effective Taking amitriptyline for sleep Will try adding cymbalta 20 mg  - if needs a higher dose we may need to d/c amitriptyline Call with side effects, follow up in 3 months

## 2015-10-10 LAB — HIV ANTIBODY (ROUTINE TESTING W REFLEX): HIV 1&2 Ab, 4th Generation: NONREACTIVE

## 2015-10-10 LAB — CEA: CEA: 0.5 ng/mL

## 2015-10-10 LAB — HEPATITIS C ANTIBODY: HCV Ab: NEGATIVE

## 2015-10-12 ENCOUNTER — Encounter: Payer: Self-pay | Admitting: Internal Medicine

## 2015-10-12 DIAGNOSIS — R7303 Prediabetes: Secondary | ICD-10-CM | POA: Insufficient documentation

## 2015-11-27 ENCOUNTER — Telehealth: Payer: Self-pay | Admitting: *Deleted

## 2015-11-27 NOTE — Telephone Encounter (Signed)
LM for pt to return call to schedule appt with Dr. Burr Medico or Dr. Benay Spice. Pt with history of colon cancer.

## 2015-12-05 ENCOUNTER — Ambulatory Visit (INDEPENDENT_AMBULATORY_CARE_PROVIDER_SITE_OTHER)
Admission: RE | Admit: 2015-12-05 | Discharge: 2015-12-05 | Disposition: A | Discharge status: Home / Self Care | Payer: Medicare Other | Source: Ambulatory Visit | Attending: Internal Medicine | Admitting: Internal Medicine

## 2015-12-05 DIAGNOSIS — IMO0002 Reserved for concepts with insufficient information to code with codable children: Secondary | ICD-10-CM

## 2015-12-05 DIAGNOSIS — G622 Polyneuropathy due to other toxic agents: Secondary | ICD-10-CM

## 2015-12-05 DIAGNOSIS — R739 Hyperglycemia, unspecified: Secondary | ICD-10-CM

## 2015-12-05 DIAGNOSIS — Z85048 Personal history of other malignant neoplasm of rectum, rectosigmoid junction, and anus: Secondary | ICD-10-CM

## 2015-12-05 DIAGNOSIS — Z780 Asymptomatic menopausal state: Secondary | ICD-10-CM

## 2015-12-05 DIAGNOSIS — I10 Essential (primary) hypertension: Secondary | ICD-10-CM | POA: Diagnosis not present

## 2015-12-05 DIAGNOSIS — R5383 Other fatigue: Secondary | ICD-10-CM

## 2015-12-05 DIAGNOSIS — D649 Anemia, unspecified: Secondary | ICD-10-CM | POA: Diagnosis not present

## 2015-12-05 DIAGNOSIS — Z1159 Encounter for screening for other viral diseases: Secondary | ICD-10-CM

## 2015-12-05 DIAGNOSIS — Z114 Encounter for screening for human immunodeficiency virus [HIV]: Secondary | ICD-10-CM

## 2016-01-10 ENCOUNTER — Telehealth: Payer: Self-pay | Admitting: Hematology and Oncology

## 2016-01-10 ENCOUNTER — Ambulatory Visit (HOSPITAL_BASED_OUTPATIENT_CLINIC_OR_DEPARTMENT_OTHER): Payer: Medicare Other | Admitting: Hematology and Oncology

## 2016-01-10 ENCOUNTER — Ambulatory Visit: Payer: Medicare Other | Admitting: Internal Medicine

## 2016-01-10 VITALS — BP 130/72 | HR 57 | Temp 97.7°F | Resp 18 | Ht 66.0 in | Wt 170.3 lb

## 2016-01-10 DIAGNOSIS — E1143 Type 2 diabetes mellitus with diabetic autonomic (poly)neuropathy: Secondary | ICD-10-CM | POA: Insufficient documentation

## 2016-01-10 DIAGNOSIS — Z23 Encounter for immunization: Secondary | ICD-10-CM

## 2016-01-10 DIAGNOSIS — Z794 Long term (current) use of insulin: Secondary | ICD-10-CM | POA: Diagnosis not present

## 2016-01-10 DIAGNOSIS — I10 Essential (primary) hypertension: Secondary | ICD-10-CM | POA: Diagnosis not present

## 2016-01-10 DIAGNOSIS — K591 Functional diarrhea: Secondary | ICD-10-CM

## 2016-01-10 DIAGNOSIS — Z85048 Personal history of other malignant neoplasm of rectum, rectosigmoid junction, and anus: Secondary | ICD-10-CM

## 2016-01-10 DIAGNOSIS — R197 Diarrhea, unspecified: Secondary | ICD-10-CM | POA: Diagnosis not present

## 2016-01-10 DIAGNOSIS — E1140 Type 2 diabetes mellitus with diabetic neuropathy, unspecified: Secondary | ICD-10-CM | POA: Insufficient documentation

## 2016-01-10 MED ORDER — AMLODIPINE BESYLATE 5 MG PO TABS: 5.0000 mg | ORAL_TABLET | Freq: Every day | ORAL | 9 refills | Status: DC

## 2016-01-10 MED ORDER — METFORMIN HCL 500 MG PO TABS: 500.0000 mg | ORAL_TABLET | Freq: Every day | ORAL | 0 refills | Status: DC

## 2016-01-10 MED ORDER — INFLUENZA VAC SPLIT QUAD 0.5 ML IM SUSY
0.5000 mL | PREFILLED_SYRINGE | Freq: Once | INTRAMUSCULAR | Status: AC
  Administered 2016-01-10: 0.5 mL via INTRAMUSCULAR
  Filled 2016-01-10: qty 0.5

## 2016-01-10 NOTE — Telephone Encounter (Signed)
GAVE PATIENT AVS REPORT AND APPOINTMENTS FOR October  °

## 2016-01-11 ENCOUNTER — Telehealth: Payer: Self-pay | Admitting: *Deleted

## 2016-01-11 ENCOUNTER — Encounter: Payer: Self-pay | Admitting: Hematology and Oncology

## 2016-01-11 NOTE — Telephone Encounter (Signed)
Pt states unable to get Metformin filled until Monday.  She asks what the dose and instructions are because she has "access" to Metformin but wants to make sure it is the correct dose.  Informed pt Dr. Alvy Bimler prescribed Metformin 500 mg once daily w/ breakfast.  Pt verbalized understanding.

## 2016-01-11 NOTE — Progress Notes (Signed)
[facility] OFFICE PROGRESS NOTE  Patient Care Team: Binnie Rail, MD as PCP - General (Internal Medicine) Lafayette Dragon, MD (Inactive) (Gastroenterology) Nunzio Cobbs, MD (Obstetrics and Gynecology) Jana Half, DPM (Podiatry) Heath Lark, MD as Consulting Physician (Hematology and Oncology) Leighton Ruff, MD (General Surgery)  SUMMARY OF ONCOLOGIC HISTORY: This is a patient was diagnosed with rectal cancer after presentation with significant rectal bleeding. Colonoscopy & biopsies confirmed cancer. She received neoadjuvant chemotherapy and radiation therapy followed by low anterior resection in November of 2010. 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 Starting in January of 2011 she received adjuvant FOLFOX chemotherapy complicated by peripheral neuropathy. Oxaliplatin  was subsequently discontinued. She completed all chemotherapy by June of 2011. She was operated at [facility] in 2013 due to poor bowel function In January 2015, she had repair of her parastomal hernia and reconstruction surgery. In September 2016, surveillance colonoscopy was negative for disease recurrence  INTERVAL HISTORY: Please see below for problem oriented charting. The patient was referred back here because of chronic, persistent diarrhea and concern for cancer recurrence. She also complained of persistent neuropathy that is not getting better despite no chemotherapy for so many years. She cannot count the number of times she has to empty her colostomy bag daily. She constantly feels thirsty and dehydrated. Denies new lymphadenopathy. she denies abdominal pain or nausea.  REVIEW OF SYSTEMS:   Constitutional: Denies fevers, chills or abnormal weight loss Eyes: Denies blurriness of vision Ears, nose, mouth, throat, and face: Denies mucositis or sore throat Respiratory: Denies cough, dyspnea or  wheezes Cardiovascular: Denies palpitation, chest discomfort or lower extremity swelling Skin: Denies abnormal skin rashes Lymphatics: Denies new lymphadenopathy or easy bruising Neurological:Denies numbness, tingling or new weaknesses Behavioral/Psych: Mood is stable, no new changes  All other systems were reviewed with the patient and are negative.  I have reviewed the past medical history, past surgical history, social history and family history with the patient and they are unchanged from previous note.  ALLERGIES:  is allergic to ampicillin; hydrocodone; penicillins; and grapeseed extract [nutritional supplements].  MEDICATIONS:  Current Outpatient Prescriptions  Medication Sig Dispense Refill  . ALPRAZolam (XANAX) 0.5 MG tablet Take 1 tablet (0.5 mg total) by mouth 2 (two) times daily as needed for anxiety or sleep. 60 tablet 2  . amitriptyline (ELAVIL) 25 MG tablet Take 1 tablet (25 mg total) by mouth at bedtime as needed for sleep. 30 tablet 5  . amLODipine (NORVASC) 5 MG tablet Take 1 tablet (5 mg total) by mouth daily. 30 tablet 9  . DULoxetine (CYMBALTA) 20 MG capsule Take 1 capsule (20 mg total) by mouth daily. 30 capsule 3  . hydrochlorothiazide (HYDRODIURIL) 25 MG tablet Take 1 tablet (25 mg total) by mouth every morning. 90 tablet 1  . lisinopril (PRINIVIL,ZESTRIL) 20 MG tablet Take 1 tablet (20 mg total) by mouth daily. Must keep June appt w/new PCP for future refills 30 tablet 1  . metFORMIN (GLUCOPHAGE) 500 MG tablet Take 1 tablet (500 mg total) by mouth daily with breakfast. 30 tablet 0  . naproxen sodium (ANAPROX) 220 MG tablet Take 440 mg by mouth 2 (two) times daily as needed (pain).     No current facility-administered medications for this visit.     PHYSICAL EXAMINATION: ECOG PERFORMANCE STATUS: 1 - Symptomatic but completely ambulatory  Vitals:   01/10/16 0927  BP: 130/72  Pulse: (!) 57  Resp: 18  Temp: 97.7 F (36.5 C)   Filed Weights   01/10/16 0927   Weight: 170 lb 4.8 oz (77.2 kg)    GENERAL:alert, no distress and comfortable SKIN: skin color, texture, turgor are normal, no rashes or significant lesions EYES: normal, Conjunctiva are pink and non-injected, sclera clear OROPHARYNX:no exudate, no erythema and lips, buccal mucosa, and tongue normal  NECK: supple, thyroid normal size, non-tender, without nodularity LYMPH:  no palpable lymphadenopathy in the cervical, axillary or inguinal LUNGS: clear to auscultation and percussion with normal breathing effort HEART: regular rate & rhythm and no murmurs and no lower extremity edema ABDOMEN:abdomen soft, non-tender and normal bowel sounds. Colostomy site looks okay without hernia Musculoskeletal:no cyanosis of digits and no clubbing  NEURO: alert & oriented x 3 with fluent speech, no focal motor/sensory deficits  LABORATORY DATA:  I have reviewed the data as listed    Component Value Date/Time   NA 139 10/09/2015 1645   NA 142 09/09/2013 0809   K 3.7 10/09/2015 1645   K 4.1 09/09/2013 0809   CL 107 10/09/2015 1645   CL 108 (H) 09/14/2012 1453   CO2 26 10/09/2015 1645   CO2 21 (L) 09/09/2013 0809   GLUCOSE 112 (H) 10/09/2015 1645   GLUCOSE 98 09/09/2013 0809   GLUCOSE 106 (H) 09/14/2012 1453   BUN 18 10/09/2015 1645   BUN 18.2 09/09/2013 0809   CREATININE 0.82 10/09/2015 1645   CREATININE 0.8 09/09/2013 0809   CALCIUM 9.7 10/09/2015 1645   CALCIUM 9.4 09/09/2013 0809   PROT 7.7 10/09/2015 1645   PROT 7.0 09/09/2013 0809   ALBUMIN 4.4 10/09/2015 1645   ALBUMIN 3.7 09/09/2013 0809   AST 24 10/09/2015 1645   AST 26 09/09/2013 0809   ALT 27 10/09/2015 1645   ALT 28 09/09/2013 0809   ALKPHOS 102 10/09/2015 1645   ALKPHOS 95 09/09/2013 0809   BILITOT 0.5 10/09/2015 1645   BILITOT 0.48 09/09/2013 0809   GFRNONAA >60 01/20/2015 0045   GFRAA >60 01/20/2015 0045    No results found for: SPEP, UPEP  Lab Results  Component Value Date   WBC 8.8 10/09/2015   NEUTROABS 6.1  10/09/2015   HGB 13.5 10/09/2015   HCT 40.8 10/09/2015   MCV 92.2 10/09/2015   PLT 345.0 10/09/2015      Chemistry      Component Value Date/Time   NA 139 10/09/2015 1645   NA 142 09/09/2013 0809   K 3.7 10/09/2015 1645   K 4.1 09/09/2013 0809   CL 107 10/09/2015 1645   CL 108 (H) 09/14/2012 1453   CO2 26 10/09/2015 1645   CO2 21 (L) 09/09/2013 0809   BUN 18 10/09/2015 1645   BUN 18.2 09/09/2013 0809   CREATININE 0.82 10/09/2015 1645   CREATININE 0.8 09/09/2013 0809      Component Value Date/Time   CALCIUM 9.7 10/09/2015 1645   CALCIUM 9.4 09/09/2013 0809   ALKPHOS 102 10/09/2015 1645   ALKPHOS 95 09/09/2013 0809   AST 24 10/09/2015 1645   AST 26 09/09/2013 0809   ALT 27 10/09/2015 1645   ALT 28 09/09/2013 0809   BILITOT 0.5 10/09/2015 1645   BILITOT 0.48 09/09/2013 0809      ASSESSMENT & PLAN:  PERSONAL HX RECTAL CANCER Examination is benign. Recent surveillance colonoscopy and CEA blood work were negative. The patient is a long-term cancer survivor. I will transition her care to cancer survivorship clinic.  Diarrhea She  has chronic diarrhea related to her prior colon surgery. I recommend antidiarrheal medication on a regular basis before each meal. The patient is at risk of dehydration and I will discontinue her diuretic therapy  Essential hypertension The patient have chronic hypertension. She is prediabetic. The patient is at risk of dehydration due to chronic diarrhea. I discontinue her diuretic therapy and switch her to amlodipine  Type 2 diabetes mellitus with diabetic autonomic neuropathy, without long-term current use of insulin ([place]) She complained of persistent neuropathy. The patient is a long-term cancer survivor and I do not expect her to have residual worsening/persistent neuropathy from prior chemotherapy. I suspect it is related to uncontrolled hyperglycemia/new onset diabetes. The patient needs close follow-up with her primary care doctor  for diabetes management. She would benefit from metformin therapy   Orders Placed This Encounter  Procedures  . Amb Referral to Survivorship Long term    Referral Priority:   Routine    Referral Type:   Consultation    Referred to Provider:   Holley Bouche, NP    Number of Visits Requested:   1   All questions were answered. The patient knows to call the clinic with any problems, questions or concerns. No barriers to learning was detected. I spent 20 minutes counseling the patient face to face. The total time spent in the appointment was 30 minutes and more than 50% was on counseling and review of test results     Heath Lark, MD 01/11/2016 2:12 PM

## 2016-01-11 NOTE — Assessment & Plan Note (Signed)
The patient have chronic hypertension. She is prediabetic. The patient is at risk of dehydration due to chronic diarrhea. I discontinue her diuretic therapy and switch her to amlodipine

## 2016-01-11 NOTE — Assessment & Plan Note (Signed)
Examination is benign. Recent surveillance colonoscopy and CEA blood work were negative. The patient is a long-term cancer survivor. I will transition her care to cancer survivorship clinic.

## 2016-01-11 NOTE — Assessment & Plan Note (Signed)
She has chronic diarrhea related to her prior colon surgery. I recommend antidiarrheal medication on a regular basis before each meal. The patient is at risk of dehydration and I will discontinue her diuretic therapy

## 2016-01-11 NOTE — Assessment & Plan Note (Signed)
She complained of persistent neuropathy. The patient is a long-term cancer survivor and I do not expect her to have residual worsening/persistent neuropathy from prior chemotherapy. I suspect it is related to uncontrolled hyperglycemia/new onset diabetes. The patient needs close follow-up with her primary care doctor for diabetes management. She would benefit from metformin therapy

## 2016-01-17 NOTE — Progress Notes (Signed)
Subjective:    Patient ID: [name], female    DOB: [birth year], 60 y.o.   MRN: [id number]  HPI The patient is here for follow up.  Hypertension: She is taking her medication daily. She is compliant with a low sodium diet.  She denies chest pain, palpitations, edema, shortness of breath and regular headaches. She is exercising regularly - walking daily.  She does monitor her blood pressure at home.  Diabetes: She is taking her medication daily as prescribed - she was started on metformin by Dr Alvy Bimler. She is not compliant with a diabetic diet, but is currently making changes. She has been checking her sugars - -Her boyfriend has diabetes and she uses his machine .  She is exercising regularly. She checks her feet daily and denies foot lesions. She is not up-to-date with an ophthalmology examination.   Neuropathy: The burning in her feet has improved after starting the metformin.  She has significant neuropathy from chemotherapy. She does not have good sensation in her feet and she does check her feet daily.  Medications and allergies reviewed with patient and updated if appropriate.  Patient Active Problem List   Diagnosis Date Noted  . Type 2 diabetes mellitus with diabetic autonomic neuropathy, without long-term current use of insulin ([place]) 01/10/2016  . Colon perforation ([place]) 01/10/2015  . Bursitis of right shoulder 01/19/2014  . Tibial plateau fracture, left 01/19/2014  . Multiple falls 01/19/2014  . Colostomy malfunction ([place]) 12/09/2012  . Peristomal hernia 04/19/2012  . Depression   . Seborrheic dermatitis 08/09/2010  . PERSONAL HX RECTAL CANCER 06/05/2010  . SMOKER 04/16/2010  . ALLERGIC RHINITIS 04/16/2010  . UNSPECIFIED ANEMIA 12/06/2009  . 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 Essential hypertension 12/06/2009  . Fatigue 12/06/2009  . Diarrhea 12/06/2009    Current Outpatient Prescriptions on File Prior to Visit  Medication Sig Dispense Refill  .  ALPRAZolam (XANAX) 0.5 MG tablet Take 1 tablet (0.5 mg total) by mouth 2 (two) times daily as needed for anxiety or sleep. 60 tablet 2  . amitriptyline (ELAVIL) 25 MG tablet Take 1 tablet (25 mg total) by mouth at bedtime as needed for sleep. 30 tablet 5  . amLODipine (NORVASC) 5 MG tablet Take 1 tablet (5 mg total) by mouth daily. 30 tablet 9  . DULoxetine (CYMBALTA) 20 MG capsule Take 1 capsule (20 mg total) by mouth daily. 30 capsule 3  . lisinopril (PRINIVIL,ZESTRIL) 20 MG tablet Take 1 tablet (20 mg total) by mouth daily. Must keep June appt w/new PCP for future refills 30 tablet 1  . metFORMIN (GLUCOPHAGE) 500 MG tablet Take 1 tablet (500 mg total) by mouth daily with breakfast. 30 tablet 0  . naproxen sodium (ANAPROX) 220 MG tablet Take 440 mg by mouth 2 (two) times daily as needed (pain).     No current facility-administered medications on file prior to visit.     Past Medical History:  Diagnosis Date  . Adenomatous colon polyp   . Anemia, unspecified   . Cocaine abuse    as recently as 10/21/11  . Depression   . Difficulty sleeping    takes meds to sleep  . Diverticulosis   . Hernia 03/18/2013  . History of transfusion   . Hypertension   . Incontinence of bowel    since colostomy  . Knee fracture, left   . 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 Nonspecific colitis 10/10/11  . Radiation proctitis  mild  . 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 APPENDECTOMY N/A 01/09/2015   Procedure: APPENDECTOMY;  Surgeon: Excell Seltzer, MD;  Location: [facility];  Service: General;  Laterality: N/A;  . COLOSTOMY  2013   [facility]  . FINGER FRACTURE SURGERY Left    and hand surgery  . HERNIA REPAIR    . LAPAROSCOPIC PARASTOMAL HERNIA N/A 05/12/2013   Procedure: LAPAROSCOPIC PARASTOMAL HERNIA;  Surgeon: Leighton Ruff, MD;  Location: [facility];  Service: General;  Laterality: N/A;  .  LAPAROTOMY N/A 01/09/2015   Procedure: EXPLORATORY LAPAROTOMY;  Surgeon: Excell Seltzer, MD;  Location: [facility];  Service: General;  Laterality: N/A;  RESECTION OF COLOSTOMY, CREATION OF NEW COLOSTOMY, VENTRAL HERNIA REPAIR WITH BIOLOGIC MESH,  LYSIS OF ADHESIONS , OMENTECTOMY  . LOW ANTERIOR BOWEL RESECTION  03/07/09  . MOUTH SURGERY  10/2009  . PORTACATH PLACEMENT     and removal    Social History   Social History  . Marital status: Divorced    Spouse name: N/A  . Number of children: 0  . Years of education: N/A   Occupational History  . disabiled    Social History Main Topics  . Smoking status: Current Every Day Smoker    Packs/day: 0.25    Years: 40.00    Types: Cigarettes  . Smokeless tobacco: Never Used  . Alcohol use 0.0 oz/week     Comment: occasional  driink  . Drug use:     Types: Marijuana, Cocaine     Comment: no longer using Marijuana; last use of cocaine nov 2014   . Sexual activity: Not on file   Other Topics Concern  . 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 Colon cancer Mother   . Diabetes Father   . Cirrhosis Brother   . Stomach cancer Sister   . Colon polyps Sister   . Pulmonary Hypertension Brother     Review of Systems  Constitutional: Negative for fever.  Respiratory: Negative for cough, shortness of breath and wheezing.   Cardiovascular: Negative for chest pain, palpitations and leg swelling.  Neurological: Negative for dizziness, light-headedness and headaches.       Objective:   Vitals:   01/18/16 0829  BP: 110/62  Pulse: 66  Resp: 16  Temp: 97.9 F (36.6 C)   Filed Weights   01/18/16 0829  Weight: 168 lb (76.2 kg)   Body mass index is 27.12 kg/m.   Physical Exam    Constitutional: Appears well-developed and well-nourished. No  distress.  HENT:  Head: Normocephalic and atraumatic.  Neck: Neck supple. No tracheal deviation present. No thyromegaly present.  No cervical lymphadenopathy Cardiovascular: Normal rate, regular rhythm and normal heart sounds.   No murmur heard. No carotid bruit .  No edema Pulmonary/Chest: Effort normal and breath sounds normal. No respiratory distress. No has no wheezes. No rales.  Skin: Skin is warm and dry. Not diaphoretic.  Psychiatric: Normal mood and affect. Behavior is normal.      Assessment & Plan:    See Problem List for Assessment and Plan of chronic medical problems.    F/u in 4 months

## 2016-01-17 NOTE — Patient Instructions (Addendum)
Schedule an eye exam and have a report sent to me.     All other Health Maintenance issues reviewed.   All recommended immunizations and age-appropriate screenings are up-to-date or discussed.  No immunizations administered today.   Medications reviewed and updated.  No changes recommended at this time.   Please followup in 4 months

## 2016-01-18 ENCOUNTER — Encounter: Payer: Self-pay | Admitting: Internal Medicine

## 2016-01-18 ENCOUNTER — Ambulatory Visit (INDEPENDENT_AMBULATORY_CARE_PROVIDER_SITE_OTHER): Payer: Medicare Other | Admitting: Internal Medicine

## 2016-01-18 DIAGNOSIS — G6289 Other specified polyneuropathies: Secondary | ICD-10-CM

## 2016-01-18 DIAGNOSIS — I10 Essential (primary) hypertension: Secondary | ICD-10-CM | POA: Diagnosis not present

## 2016-01-18 DIAGNOSIS — E1143 Type 2 diabetes mellitus with diabetic autonomic (poly)neuropathy: Secondary | ICD-10-CM | POA: Diagnosis not present

## 2016-01-18 NOTE — Progress Notes (Signed)
Pre visit review using our clinic review tool, if applicable. No additional management support is needed unless otherwise documented below in the visit note. 

## 2016-01-18 NOTE — Assessment & Plan Note (Signed)
BP well controlled Current regimen effective and well tolerated Continue current medications at current doses  

## 2016-01-18 NOTE — Assessment & Plan Note (Signed)
Slight improvement after starting metformin Has not started the Cymbalta yet, but may start this week Monitor her feet daily

## 2016-01-18 NOTE — Assessment & Plan Note (Signed)
Continue metformin 500 mg daily last A1c 6.4% Recheck A1c in 4 months She is currently making changes to her diet-decreasing carbohydrates. Advised that she can see nutritionist if she would like Continue regular exercise Advised weight loss She will schedule an eye exam

## 2016-02-06 NOTE — Progress Notes (Deleted)
CLINIC:  Survivorship   REASON FOR VISIT:  Routine follow-up for history of rectosigmoid cancer.   BRIEF ONCOLOGIC HISTORY:  (From Dr. Calton Dach note on 01/10/16)    INTERVAL HISTORY:  [name] presents to the [facility] today for routine follow-up for her history of adenocarcinoma of the rectosigmoid junction.  Overall, she reports feeling quite well. ***    REVIEW OF SYSTEMS:     GU: Denies vaginal bleeding, discharge, or dryness.  Breast: Denies any new nodularity, masses, tenderness, nipple changes, or nipple discharge.    A 14-point review of systems was completed and was negative, except as noted above.    PAST MEDICAL/SURGICAL HISTORY:  Past Medical History:  Diagnosis Date  . Adenomatous colon polyp   . Anemia, unspecified   . Cocaine abuse    as recently as 10/21/11  . Depression   . Difficulty sleeping    takes meds to sleep  . Diverticulosis   . Hernia 03/18/2013  . History of transfusion   . Hypertension   . Incontinence of bowel    since colostomy  . Knee fracture, left   . 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 Nonspecific colitis 10/10/11  . Radiation proctitis    mild  . 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 APPENDECTOMY N/A 01/09/2015   Procedure: APPENDECTOMY;  Surgeon: Excell Seltzer, MD;  Location[facility];  Service: General;  Laterality: N/A;  . COLOSTOMY  2013   [facility]  . FINGER FRACTURE SURGERY Left    and hand surgery  . HERNIA REPAIR    . LAPAROSCOPIC PARASTOMAL HERNIA N/A 05/12/2013   Procedure: LAPAROSCOPIC PARASTOMAL HERNIA;  Surgeon: Leighton Ruff, MD;  Location: [facility];  Service: General;  Laterality: N/A;  . LAPAROTOMY N/A 01/09/2015   Procedure: EXPLORATORY LAPAROTOMY;  Surgeon: Excell Seltzer, MD;  Location: [facility];  Service: General;  Laterality: N/A;  RESECTION OF COLOSTOMY, CREATION OF NEW COLOSTOMY, VENTRAL  HERNIA REPAIR WITH BIOLOGIC MESH,  LYSIS OF ADHESIONS , OMENTECTOMY  . LOW ANTERIOR BOWEL RESECTION  03/07/09  . MOUTH SURGERY  10/2009  . PORTACATH PLACEMENT     and removal     ALLERGIES:  Allergies  Allergen Reactions  . Ampicillin Hives  . Hydrocodone Itching    Can take Oxycodone and Codeine  . Penicillins Other (See Comments)    Unknown reaction  . Grapeseed Extract [Nutritional Supplements] Rash     CURRENT MEDICATIONS:  Outpatient Encounter Prescriptions as of 02/07/2016  Medication Sig  . ALPRAZolam (XANAX) 0.5 MG tablet Take 1 tablet (0.5 mg total) by mouth 2 (two) times daily as needed for anxiety or sleep.  [name] amitriptyline (ELAVIL) 25 MG tablet Take 1 tablet (25 mg total) by mouth at bedtime as needed for sleep.  [name] amLODipine (NORVASC) 5 MG tablet Take 1 tablet (5 mg total) by mouth daily.  . DULoxetine (CYMBALTA) 20 MG capsule Take 1 capsule (20 mg total) by mouth daily.  [name] lisinopril (PRINIVIL,ZESTRIL) 20 MG tablet Take 1 tablet (20 mg total) by mouth daily. Must keep June appt w/new PCP for future refills  . metFORMIN (GLUCOPHAGE) 500 MG tablet Take 1 tablet (500 mg total) by mouth daily with breakfast.  . naproxen sodium (ANAPROX) 220 MG tablet Take 440 mg by mouth 2 (two) times daily as needed (pain).   No facility-administered encounter medications on file as of  02/07/2016.      ONCOLOGIC FAMILY HISTORY:  Family History  Problem Relation Age of Onset  . Colon cancer Mother   . Diabetes Father   . Cirrhosis Brother   . Stomach cancer Sister   . Colon polyps Sister   . Pulmonary Hypertension Brother       SOCIAL HISTORY:  HAITI FLINN is /single/married/divorced/widowed/separated and lives alone/with her spouse/family/friend in (city), [place].  [name]  has (#) children and they live in (city).  Currently retired/disabled/working part-time/full-time as ***.  Denies any current or history of tobacco, alcohol, or illicit drug use.***     PHYSICAL EXAMINATION:  Vital Signs: There were no vitals filed for this visit. There were no vitals filed for this visit. General: Well-nourished, well-appearing female/female*** in no acute distress. Accompanied/Unaccompanied today. HEENT: Head is normocephalic.  Pupils equal and reactive to light. Conjunctivae clear without exudate.  Sclerae anicteric. Oral mucosa is pink, moist.  Oropharynx is pink without lesions or erythema.  Lymph: No cervical, supraclavicular, infraclavicular, or axillary lymphadenopathy noted on palpation.  Cardiovascular: Regular rate and rhythm.[name] Respiratory: Clear to auscultation bilaterally. Chest expansion symmetric; breathing non-labored.  GI: Abdomen soft and round; non-tender, non-distended. Bowel sounds normoactive. No hepatosplenomegaly.   GU: Deferred.  Neuro: No focal deficits. Steady gait.  Psych: Mood and affect normal and appropriate for situation.  Extremities: No edema. Skin: Warm and dry.   LABORATORY DATA:  None for this visit***  DIAGNOSTIC IMAGING:  None for this visit***     ASSESSMENT AND PLAN:  Ms.. Phillip Heal is a pleasant 60 y.o. female/female*** with history of Stage ***  , diagnosed in ***; treated with ***. Ms.Graham presents to the Survivorship Clinic for surveillance and routine follow-up.   1. History of Stage *** cancer:  [name] is currently clinically and radiographically without evidence of disease or recurrence of *** cancer. She will follow-up in the Survivorship Clinic in 1 year with labs, history, and physical exam per surveillance protocol.  I encouraged her to call me with any questions or concerns before her next visit at the cancer center, and I would be happy to see the patient sooner, if needed.    #. Problem(s) at Visit___________________.  #. Cancer screening:  Due to Ms. Graham's history and age, she should receive screening for skin cancers, breast cancer, colon cancer, and ***gynecologic cancers. The patient  was encouraged to follow-up with her PCP for appropriate cancer screenings.   #. Health maintenance and wellness promotion: [name] was encouraged to consume 5-7 servings of fruits and vegetables per day. The patient was also encouraged to engage in moderate to vigorous exercise for 30 minutes per day most days of the week. [name] was instructed to limit her alcohol consumption and continue to abstain from tobacco use/was encouraged stop smoking.  ***    Dispo:  -Return to cancer center to see Survivorship NP in ***   A total of (#) minutes of face-to-face time was spent with this patient with greater than 50% of that time in counseling and care-coordination.   Mike Craze, NP Survivorship Program [facility] [phone]   Note: PRIMARY CARE PROVIDER Binnie Rail, [place] [phone]

## 2016-02-07 ENCOUNTER — Encounter: Payer: Medicare Other | Admitting: Adult Health

## 2016-02-12 ENCOUNTER — Telehealth: Payer: Self-pay | Admitting: Internal Medicine

## 2016-02-12 NOTE — Telephone Encounter (Signed)
Called patient to schedule awv appt. Patient did not answer phone.

## 2016-02-18 ENCOUNTER — Telehealth: Payer: Self-pay | Admitting: Emergency Medicine

## 2016-02-18 NOTE — Telephone Encounter (Signed)
Please advise 

## 2016-02-18 NOTE — Telephone Encounter (Signed)
Last filled 10/19 so she is early - can not take more than prescribed.  Should come in to discuss

## 2016-02-18 NOTE — Telephone Encounter (Signed)
error 

## 2016-02-18 NOTE — Telephone Encounter (Signed)
Pt called and stated she needs a refill on her ALPRAZolam (XANAX) 0.5 MG tablet. She has been taking more than what she has been prescribed so she is already out of medication. Does she need to make an OV or will Dr Quay Burow refill this? Please advise thanks.

## 2016-02-25 ENCOUNTER — Encounter: Payer: Medicare Other | Admitting: Adult Health

## 2016-04-23 ENCOUNTER — Other Ambulatory Visit: Payer: Self-pay | Admitting: Internal Medicine

## 2016-04-23 NOTE — Telephone Encounter (Signed)
RX faxed to [facility] 

## 2016-05-10 IMAGING — CT CT ABD-PELV W/ CM
2 of 5 series · 16 of 46 positions shown, 18 images · IV contrast (APPLIED)
Comparison: CT 02/03/2014

CLINICAL DATA: Peristomal hernia repaired 6 months prior. Colostomy
4224 revision [DATE]. History of rectal carcinoma.

EXAM:
CT ABDOMEN AND PELVIS WITH CONTRAST
TECHNIQUE: Multidetector CT imaging of the abdomen and pelvis was performed
using the standard protocol following bolus administration of
intravenous contrast.
CONTRAST:  100mL [id number] IOPAMIDOL ([id number]) INJECTION 61%
BUN and creatinine were obtained on site at [HOSPITAL] at
[HOSPITAL].
Results:  BUN 7 mg/dL,  Creatinine 0.6 mg/dL.

[Series 2: abd pelvis 5.0 i41s 1 · axial · 0.85mm/px · z∈[-437,-77]mm · 13 of 80 slices shown, 15 images]
[im 4/80  soft-tissue]
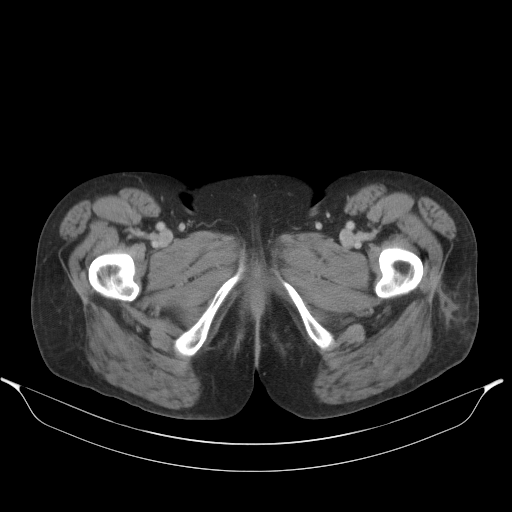
[im 4/80  bone]
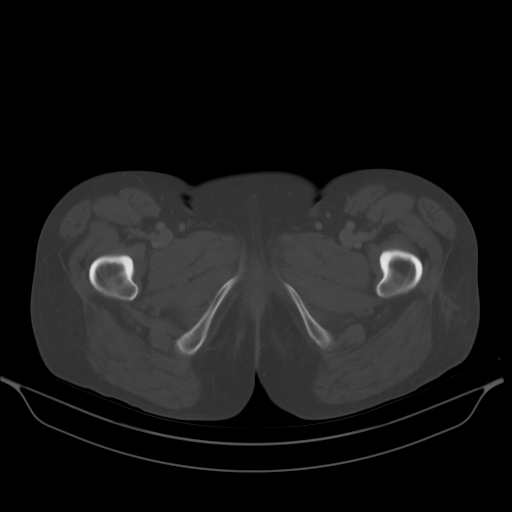
[im 12/80  soft-tissue]
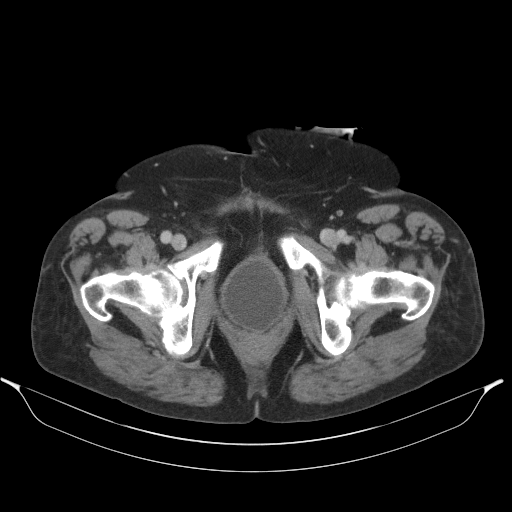
[im 16/80  soft-tissue]
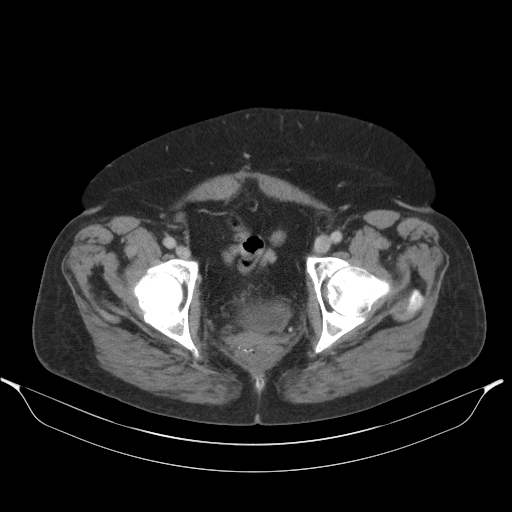
[im 24/80  soft-tissue]
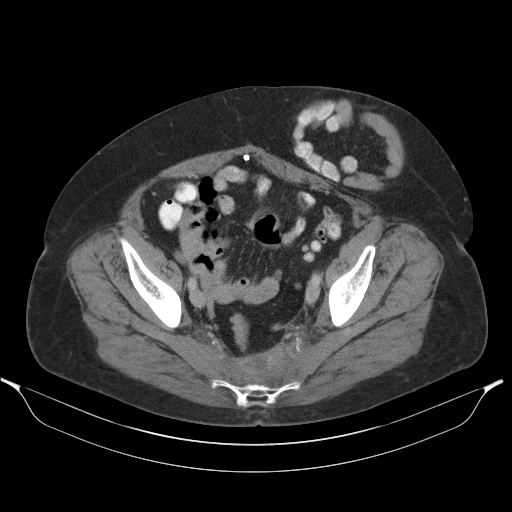
[im 28/80  soft-tissue]
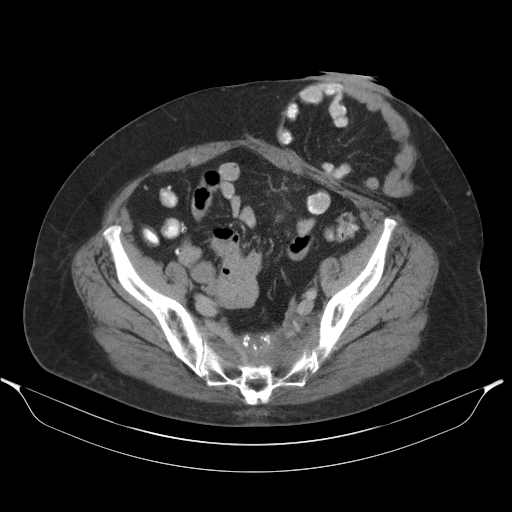
[im 36/80  soft-tissue]
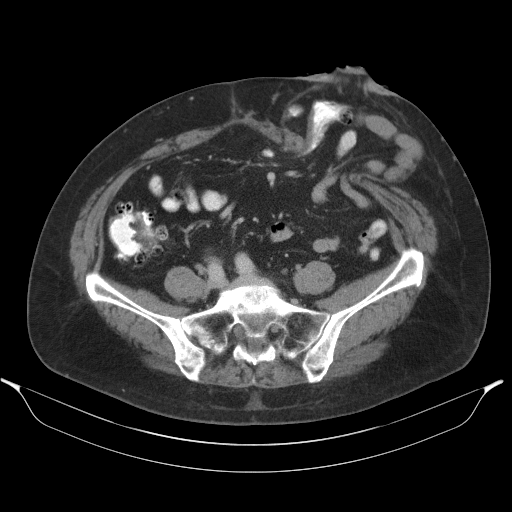
[im 40/80  soft-tissue]
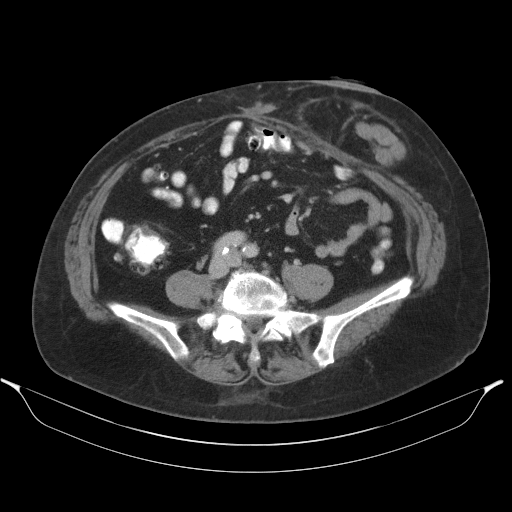
[im 44/80  soft-tissue]
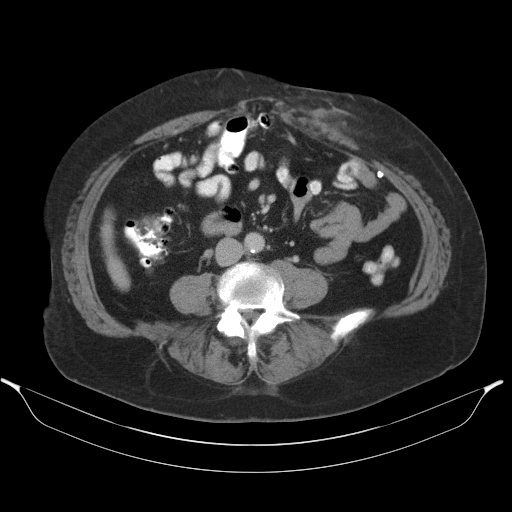
[im 52/80  soft-tissue]
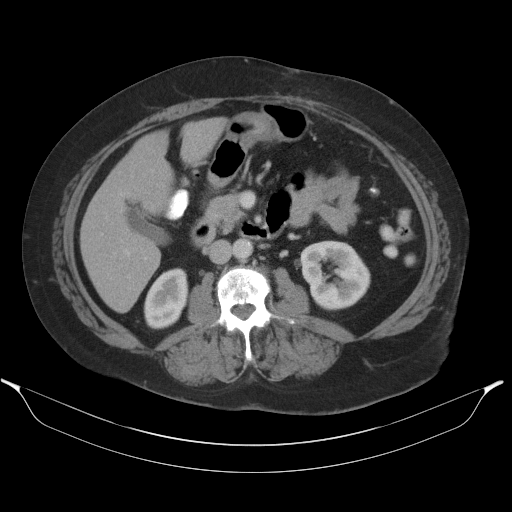
[im 52/80  bone]
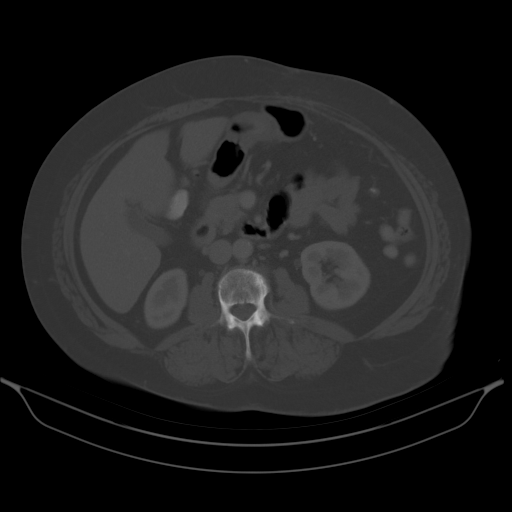
[im 56/80  soft-tissue]
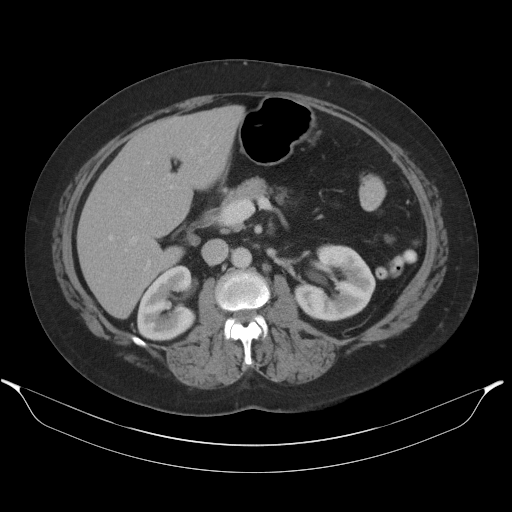
[im 64/80  soft-tissue]
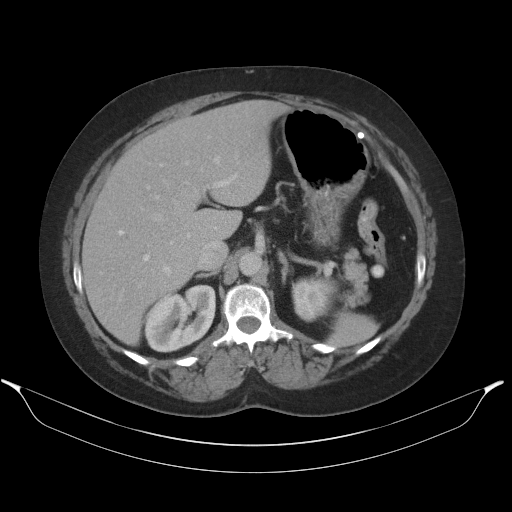
[im 68/80  soft-tissue]
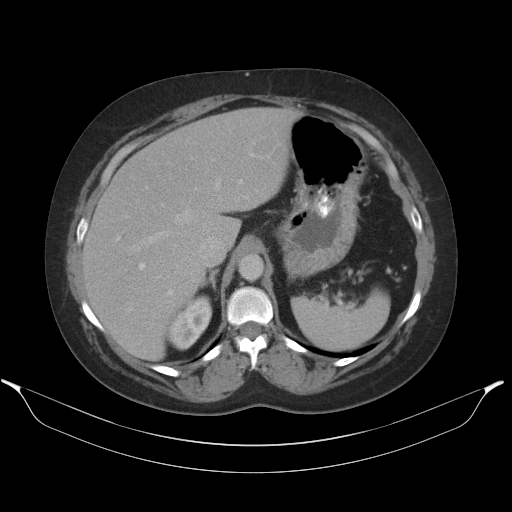
[im 76/80  soft-tissue]
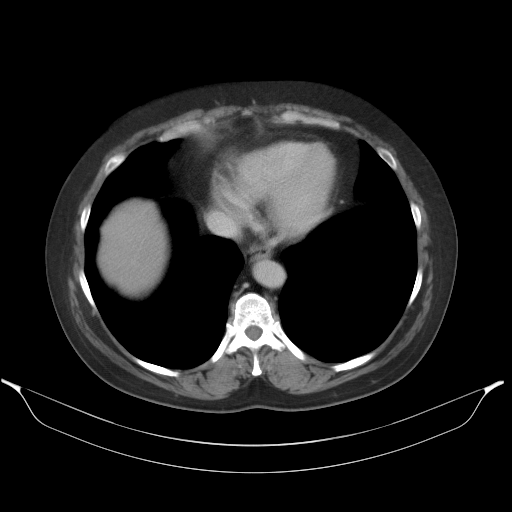

[Series 4: abd pelvis 3.0 spo cor · coronal · 0.81mm/px · 3 of 98 slices shown]
[im 33/98  soft-tissue]
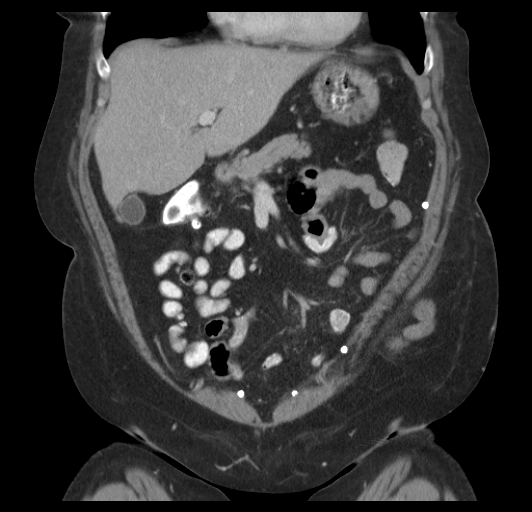
[im 44/98  soft-tissue]
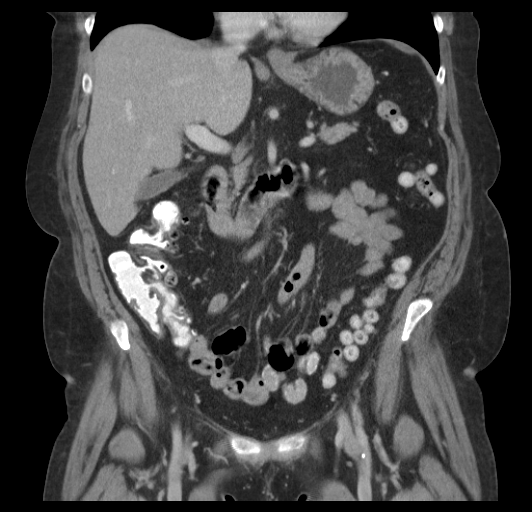
[im 54/98  soft-tissue]
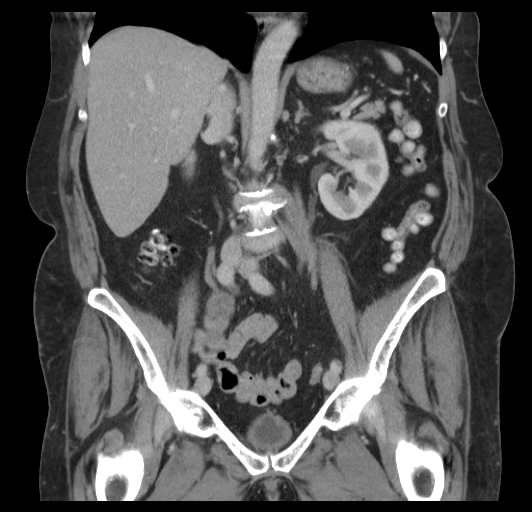

[16 of 46 positions shown; findings below may reference images not displayed]

FINDINGS: Lower chest: Lung bases are clear.

Hepatobiliary: No focal hepatic lesion. No biliary duct dilatation.
Gallbladder is normal. Common bile duct is normal.

Pancreas: Pancreas is normal. No ductal dilatation. No pancreatic
inflammation.

Spleen: Normal spleen

Adrenals/urinary tract: Adrenal glands and kidneys are normal. The
ureters and bladder normal.

Stomach/Bowel: Stomach is normal. The small bowel enters and left
lower quadrant peristomal hernia. Is a long segment of small bowel
within the hernia sac but no evidence obstruction. There are
multiple diverticula of the cecum. Diverticula extend transverse
colon which enters the left abdominal colostomy without obstruction.

There is a second ventral hernia superior to the left lower quadrant
colostomy which has fat herniating through and match. There is no
evidence of bowel entering this hernia sac. This hernia sac not.
Plain (image 33, series 2. This ventral hernia appears to be a site
of a abandoned colostomy.

The small rectal pouch is unchanged. There is presacral thickening
with calcification which is unchanged.

Vascular/Lymphatic: Abdominal aorta is normal caliber. There is no
retroperitoneal or periportal lymphadenopathy. No pelvic
lymphadenopathy.

Reproductive: Uterus is normal for age.  No adnexal abnormality.

Musculoskeletal: Degenerate change in the lower lumbar spine. No
acute findings

Other: No free fluid or abscess.
IMPRESSION: 1. Left lower quadrant colostomy with large peristomal hernia.
Imaging findings are not changed from 02/03/2014 however the long
segment small bowel entering the hernia sac is new from 09/09/2013.
No bowel obstruction.
2. Multiple diverticula of the right colon without acute
inflammation.
3. Hernia repair with mesh superior to the left lower quadrant
colostomy.
4. Presacral thickening adjacent to the rectal pouch is unchanged.

## 2016-05-20 ENCOUNTER — Encounter: Payer: Medicare Other | Admitting: Internal Medicine

## 2016-05-20 NOTE — Progress Notes (Signed)
Subjective:    Patient ID: [name], female    DOB: 03-11-1956, 61 y.o.   MRN: [id number]  HPI NO SHOW    Medications and allergies reviewed with patient and updated if appropriate.  Patient Active Problem List   Diagnosis Date Noted  . Type 2 diabetes mellitus with diabetic autonomic neuropathy, without long-term current use of insulin ([place]) 01/10/2016  . Colon perforation ([place]) 01/10/2015  . Bursitis of right shoulder 01/19/2014  . Tibial plateau fracture, left 01/19/2014  . Multiple falls 01/19/2014  . Colostomy malfunction ([place]) 12/09/2012  . Peristomal hernia 04/19/2012  . Depression   . Seborrheic dermatitis 08/09/2010  . PERSONAL HX RECTAL CANCER 06/05/2010  . SMOKER 04/16/2010  . ALLERGIC RHINITIS 04/16/2010  . UNSPECIFIED ANEMIA 12/06/2009  . Peripheral neuropathy ([place]) 12/06/2009  . Essential hypertension 12/06/2009  . Fatigue 12/06/2009  . Diarrhea 12/06/2009    Current Outpatient Prescriptions on File Prior to Visit  Medication Sig Dispense Refill  . ALPRAZolam (XANAX) 0.5 MG tablet TAKE ONE TABLET BY MOUTH TWICE DAILY AS NEEDED FOR ANXIETY OR  SLEEP 60 tablet 2  . amitriptyline (ELAVIL) 25 MG tablet Take 1 tablet (25 mg total) by mouth at bedtime as needed for sleep. 30 tablet 5  . amLODipine (NORVASC) 5 MG tablet Take 1 tablet (5 mg total) by mouth daily. 30 tablet 9  . DULoxetine (CYMBALTA) 20 MG capsule Take 1 capsule (20 mg total) by mouth daily. 30 capsule 3  . lisinopril (PRINIVIL,ZESTRIL) 20 MG tablet Take 1 tablet (20 mg total) by mouth daily. Must keep June appt w/new PCP for future refills 30 tablet 1  . metFORMIN (GLUCOPHAGE) 500 MG tablet Take 1 tablet (500 mg total) by mouth daily with breakfast. 30 tablet 0  . naproxen sodium (ANAPROX) 220 MG tablet Take 440 mg by mouth 2 (two) times daily as needed (pain).     No current facility-administered medications on file prior to visit.     Past Medical History:  Diagnosis Date  .  Adenomatous colon polyp   . Anemia, unspecified   . Cocaine abuse    as recently as 10/21/11  . Depression   . Difficulty sleeping    takes meds to sleep  . Diverticulosis   . Hernia 03/18/2013  . History of transfusion   . Hypertension   . Incontinence of bowel    since colostomy  . Knee fracture, left   . 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 Nonspecific colitis 10/10/11  . Radiation proctitis    mild  . 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 APPENDECTOMY N/A 01/09/2015   Procedure: APPENDECTOMY;  Surgeon: Excell Seltzer, MD;  Location: [facility];  Service: General;  Laterality: N/A;  . COLOSTOMY  2013   [facility]  . FINGER FRACTURE SURGERY Left    and hand surgery  . HERNIA REPAIR    . LAPAROSCOPIC PARASTOMAL HERNIA N/A 05/12/2013   Procedure: LAPAROSCOPIC PARASTOMAL HERNIA;  Surgeon: Leighton Ruff, MD;  Location: [facility];  Service: General;  Laterality: N/A;  . LAPAROTOMY N/A 01/09/2015   Procedure: EXPLORATORY LAPAROTOMY;  Surgeon: Excell Seltzer, MD;  Location: [facility];  Service: General;  Laterality: N/A;  RESECTION OF COLOSTOMY, CREATION OF NEW COLOSTOMY, VENTRAL HERNIA REPAIR WITH BIOLOGIC MESH,  LYSIS OF ADHESIONS , OMENTECTOMY  . LOW ANTERIOR BOWEL RESECTION  03/07/09  .  MOUTH SURGERY  10/2009  . PORTACATH PLACEMENT     and removal    Social History   Social History  . Marital status: Divorced    Spouse name: N/A  . Number of children: 0  . Years of education: N/A   Occupational History  . disabiled    Social History Main Topics  . Smoking status: Current Every Day Smoker    Packs/day: 0.25    Years: 40.00    Types: Cigarettes  . Smokeless tobacco: Never Used  . Alcohol use 0.0 oz/week     Comment: occasional  driink  . Drug use: Yes    Types: Marijuana, Cocaine     Comment: no longer using Marijuana; last use of cocaine nov 2014   . Sexual activity: Not  on file   Other Topics Concern  . 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 Colon cancer Mother   . Diabetes Father   . Cirrhosis Brother   . Stomach cancer Sister   . Colon polyps Sister   . Pulmonary Hypertension Brother     Review of Systems     Objective:  There were no vitals filed for this visit. Wt Readings from Last 3 Encounters:  01/18/16 168 lb (76.2 kg)  01/10/16 170 lb 4.8 oz (77.2 kg)  10/09/15 169 lb (76.7 kg)   There is no height or weight on file to calculate BMI.   Physical Exam          Assessment & Plan:        This encounter was created in error - please disregard.

## 2016-06-12 ENCOUNTER — Other Ambulatory Visit (INDEPENDENT_AMBULATORY_CARE_PROVIDER_SITE_OTHER): Payer: Medicare Other

## 2016-06-12 ENCOUNTER — Ambulatory Visit (INDEPENDENT_AMBULATORY_CARE_PROVIDER_SITE_OTHER): Payer: Medicare Other | Admitting: Internal Medicine

## 2016-06-12 ENCOUNTER — Encounter: Payer: Self-pay | Admitting: Internal Medicine

## 2016-06-12 VITALS — BP 150/86 | HR 75 | Temp 98.5°F | Resp 16 | Wt 172.0 lb

## 2016-06-12 DIAGNOSIS — R200 Anesthesia of skin: Secondary | ICD-10-CM | POA: Diagnosis not present

## 2016-06-12 DIAGNOSIS — E1143 Type 2 diabetes mellitus with diabetic autonomic (poly)neuropathy: Secondary | ICD-10-CM | POA: Diagnosis not present

## 2016-06-12 DIAGNOSIS — I10 Essential (primary) hypertension: Secondary | ICD-10-CM | POA: Diagnosis not present

## 2016-06-12 DIAGNOSIS — Z23 Encounter for immunization: Secondary | ICD-10-CM | POA: Diagnosis not present

## 2016-06-12 DIAGNOSIS — F419 Anxiety disorder, unspecified: Secondary | ICD-10-CM

## 2016-06-12 DIAGNOSIS — G6289 Other specified polyneuropathies: Secondary | ICD-10-CM | POA: Diagnosis not present

## 2016-06-12 DIAGNOSIS — F3289 Other specified depressive episodes: Secondary | ICD-10-CM

## 2016-06-12 LAB — CBC WITH DIFFERENTIAL/PLATELET
Basophils Absolute: 0.1 10*3/uL (ref 0.0–0.1)
Basophils Relative: 1.1 % (ref 0.0–3.0)
Eosinophils Absolute: 0.1 10*3/uL (ref 0.0–0.7)
Eosinophils Relative: 1.1 % (ref 0.0–5.0)
HCT: 41.6 % (ref 36.0–46.0)
Hemoglobin: 13.9 g/dL (ref 12.0–15.0)
Lymphocytes Relative: 22.6 % (ref 12.0–46.0)
Lymphs Abs: 1.8 10*3/uL (ref 0.7–4.0)
MCHC: 33.3 g/dL (ref 30.0–36.0)
MCV: 92.6 fl (ref 78.0–100.0)
Monocytes Absolute: 1 10*3/uL (ref 0.1–1.0)
Monocytes Relative: 12.5 % — ABNORMAL HIGH (ref 3.0–12.0)
Neutro Abs: 5 10*3/uL (ref 1.4–7.7)
Neutrophils Relative %: 62.7 % (ref 43.0–77.0)
Platelets: 298 10*3/uL (ref 150.0–400.0)
RBC: 4.49 Mil/uL (ref 3.87–5.11)
RDW: 13.9 % (ref 11.5–15.5)
WBC: 8 10*3/uL (ref 4.0–10.5)

## 2016-06-12 LAB — COMPREHENSIVE METABOLIC PANEL
ALT: 110 U/L — ABNORMAL HIGH (ref 0–35)
AST: 47 U/L — ABNORMAL HIGH (ref 0–37)
Albumin: 3.8 g/dL (ref 3.5–5.2)
Alkaline Phosphatase: 90 U/L (ref 39–117)
BUN: 14 mg/dL (ref 6–23)
CO2: 23 mEq/L (ref 19–32)
Calcium: 8.7 mg/dL (ref 8.4–10.5)
Chloride: 107 mEq/L (ref 96–112)
Creatinine, Ser: 0.67 mg/dL (ref 0.40–1.20)
GFR: 115.46 mL/min (ref >60.00)
Glucose, Bld: 113 mg/dL — ABNORMAL HIGH (ref 70–99)
Potassium: 3.8 mEq/L (ref 3.5–5.1)
Sodium: 136 mEq/L (ref 135–145)
Total Bilirubin: 0.7 mg/dL (ref 0.2–1.2)
Total Protein: 6.5 g/dL (ref 6.0–8.3)

## 2016-06-12 LAB — VITAMIN B12: Vitamin B-12: 283 pg/mL (ref 211–911)

## 2016-06-12 LAB — HEMOGLOBIN A1C: Hgb A1c MFr Bld: 6.5 % (ref 4.6–6.5)

## 2016-06-12 MED ORDER — GABAPENTIN 600 MG PO TABS: 1200.0000 mg | ORAL_TABLET | Freq: Three times a day (TID) | ORAL | 1 refills | Status: DC

## 2016-06-12 MED ORDER — LISINOPRIL 20 MG PO TABS: 20.0000 mg | ORAL_TABLET | Freq: Every day | ORAL | 1 refills | Status: DC | PRN

## 2016-06-12 MED ORDER — HYDROCHLOROTHIAZIDE 25 MG PO TABS: 25.0000 mg | ORAL_TABLET | Freq: Every day | ORAL | 1 refills | Status: DC

## 2016-06-12 MED ORDER — ALPRAZOLAM 0.5 MG PO TABS: ORAL_TABLET | ORAL | 2 refills | Status: DC

## 2016-06-12 MED ORDER — METFORMIN HCL 500 MG PO TABS: 500.0000 mg | ORAL_TABLET | Freq: Every day | ORAL | 1 refills | Status: DC

## 2016-06-12 NOTE — Progress Notes (Signed)
Subjective:    Patient ID: [name], female    DOB: 1955/08/20, 61 y.o.   MRN: [id number]  HPI The patient is here for follow up.  Hypertension: She did not take her BP meds this morning.  She is taking her hctz daily. She takes the lisinopril only as needed.  She is compliant with a low sodium diet.  She denies chest pain, palpitations, edema, shortness of breath and regular headaches. She is exercising regularly - walking.  She does monitor her blood pressure at the drug store - 120/70's.    Diabetes with neuropathy: She is taking her medication daily as prescribed. She is compliant with a diabetic diet. She is exercising regularly - walking. She does not monitor her sugars. She checks her feet daily and denies foot lesions. She is up-to-date with an ophthalmology examination.  She did not start the cymbalta - she would rather not take it.  She was on gabapentin in the past and did go back on it (had some leftover at home).  She would like to officially go back on the gabapentin.   Insomnia:  She feels the elavil is too overwhelming - she has not taken it in a while.   Anxiety, depression:  She does feel anxious and depressed.  She has been taking xanax twice daily and it helps.  She is not interested in any other medication.   Medications and allergies reviewed with patient and updated if appropriate.  Patient Active Problem List   Diagnosis Date Noted  . Type 2 diabetes mellitus with diabetic autonomic neuropathy, without long-term current use of insulin ([place]) 01/10/2016  . Colon perforation ([place]) 01/10/2015  . Bursitis of right shoulder 01/19/2014  . Tibial plateau fracture, left 01/19/2014  . Multiple falls 01/19/2014  . Colostomy malfunction ([place]) 12/09/2012  . Peristomal hernia 04/19/2012  . Depression   . Seborrheic dermatitis 08/09/2010  . PERSONAL HX RECTAL CANCER 06/05/2010  . SMOKER 04/16/2010  . ALLERGIC RHINITIS 04/16/2010  . Peripheral neuropathy ([place])  12/06/2009  . Essential hypertension 12/06/2009  . Fatigue 12/06/2009  . Diarrhea 12/06/2009    Current Outpatient Prescriptions on File Prior to Visit  Medication Sig Dispense Refill  . ALPRAZolam (XANAX) 0.5 MG tablet TAKE ONE TABLET BY MOUTH TWICE DAILY AS NEEDED FOR ANXIETY OR  SLEEP 60 tablet 2  . amitriptyline (ELAVIL) 25 MG tablet Take 1 tablet (25 mg total) by mouth at bedtime as needed for sleep. 30 tablet 5  . lisinopril (PRINIVIL,ZESTRIL) 20 MG tablet Take 1 tablet (20 mg total) by mouth daily. Must keep June appt w/new PCP for future refills 30 tablet 1  . metFORMIN (GLUCOPHAGE) 500 MG tablet Take 1 tablet (500 mg total) by mouth daily with breakfast. 30 tablet 0  . naproxen sodium (ANAPROX) 220 MG tablet Take 440 mg by mouth 2 (two) times daily as needed (pain).    [name] amLODipine (NORVASC) 5 MG tablet Take 1 tablet (5 mg total) by mouth daily. (Patient not taking: Reported on 06/12/2016) 30 tablet 9  . DULoxetine (CYMBALTA) 20 MG capsule Take 1 capsule (20 mg total) by mouth daily. (Patient not taking: Reported on 06/12/2016) 30 capsule 3   No current facility-administered medications on file prior to visit.     Past Medical History:  Diagnosis Date  . Adenomatous colon polyp   . Anemia, unspecified   . Cocaine abuse    as recently as 10/21/11  . Depression   . Difficulty sleeping  takes meds to sleep  . Diverticulosis   . Hernia 03/18/2013  . History of transfusion   . Hypertension   . Incontinence of bowel    since colostomy  . Knee fracture, left   . 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 Nonspecific colitis 10/10/11  . Radiation proctitis    mild  . 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 APPENDECTOMY N/A 01/09/2015   Procedure: APPENDECTOMY;  Surgeon: Excell Seltzer, MD;  Location[facility];  Service: General;  Laterality: N/A;  . COLOSTOMY  2013   [facility]  .  FINGER FRACTURE SURGERY Left    and hand surgery  . HERNIA REPAIR    . LAPAROSCOPIC PARASTOMAL HERNIA N/A 05/12/2013   Procedure: LAPAROSCOPIC PARASTOMAL HERNIA;  Surgeon: Leighton Ruff, MD;  Location[facility];  Service: General;  Laterality: N/A;  . LAPAROTOMY N/A 01/09/2015   Procedure: EXPLORATORY LAPAROTOMY;  Surgeon: Excell Seltzer, MD;  Location[facility];  Service: General;  Laterality: N/A;  RESECTION OF COLOSTOMY, CREATION OF NEW COLOSTOMY, VENTRAL HERNIA REPAIR WITH BIOLOGIC MESH,  LYSIS OF ADHESIONS , OMENTECTOMY  . LOW ANTERIOR BOWEL RESECTION  03/07/09  . MOUTH SURGERY  10/2009  . PORTACATH PLACEMENT     and removal    Social History   Social History  . Marital status: Divorced    Spouse name: N/A  . Number of children: 0  . Years of education: N/A   Occupational History  . disabiled    Social History Main Topics  . Smoking status: Current Every Day Smoker    Packs/day: 0.25    Years: 40.00    Types: Cigarettes  . Smokeless tobacco: Never Used  . Alcohol use 0.0 oz/week     Comment: occasional  driink  . Drug use: Yes    Types: Marijuana, Cocaine     Comment: no longer using Marijuana; last use of cocaine nov 2014   . Sexual activity: Not Asked   Other Topics Concern  . 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 Colon cancer Mother   . Diabetes Father   . Cirrhosis Brother   . Stomach cancer Sister   . Colon polyps Sister   . Pulmonary Hypertension Brother     Review of Systems  Constitutional: Negative for fever.  Respiratory: Negative for cough, shortness of breath and wheezing.   Cardiovascular: Negative for chest pain and leg swelling.  Gastrointestinal: Positive for diarrhea. Negative for abdominal pain and blood in stool.  Neurological: Positive for numbness.  Negative for light-headedness and headaches.  Psychiatric/Behavioral: Positive for dysphoric mood. The patient is nervous/anxious.        Objective:   Vitals:   06/12/16 1042  BP: (!) 150/86  Pulse: 75  Resp: 16  Temp: 98.5 F (36.9 C)   Wt Readings from Last 3 Encounters:  06/12/16 172 lb (78 kg)  01/18/16 168 lb (76.2 kg)  01/10/16 170 lb 4.8 oz (77.2 kg)   Body mass index is 27.76 kg/m.   Physical Exam    Constitutional: Appears well-developed and well-nourished. No distress.  HENT:  Head: Normocephalic and atraumatic.  Neck: Neck supple. No tracheal deviation present. No  thyromegaly present.  No cervical lymphadenopathy Cardiovascular: Normal rate, regular rhythm and normal heart sounds.   No murmur heard. No carotid bruit .  No edema Pulmonary/Chest: Effort normal and breath sounds normal. No respiratory distress. No has no wheezes. No rales.  Skin: Skin is warm and dry. Not diaphoretic.  Psychiatric: Normal mood and affect. Behavior is normal.      Assessment & Plan:    See Problem List for Assessment and Plan of chronic medical problems.

## 2016-06-12 NOTE — Patient Instructions (Addendum)
Schedule an eye exam.    Test(s) ordered today. Your results will be released to [facility] (or called to you) after review, usually within 72hours after test completion. If any changes need to be made, you will be notified at that same time.  All other Health Maintenance issues reviewed.   All recommended immunizations and age-appropriate screenings are up-to-date or discussed.  Pneumovax immunization administered today.   Medications reviewed and updated.  Changes include continuing hydrochlorothiazide, gabapentin and xanax   Your prescription(s) have been submitted to your pharmacy. Please take as directed and contact our office if you believe you are having problem(s) with the medication(s).   Please followup in 6 months

## 2016-06-12 NOTE — Assessment & Plan Note (Signed)
Did not start cymbalta - never has taken it Wants to restart gabapentin - was on 3600 mg / day - has been taking it up until a few weeks ago - had some leftover 1200 mg three times daily

## 2016-06-12 NOTE — Progress Notes (Signed)
Pre visit review using our clinic review tool, if applicable. No additional management support is needed unless otherwise documented below in the visit note. 

## 2016-06-14 NOTE — Assessment & Plan Note (Signed)
Check a1c Low sugar / carb diet Stressed regular exercise, keeping weight down/weight loss 

## 2016-06-14 NOTE — Assessment & Plan Note (Signed)
Mild, she is not interested in taking any medication at this time

## 2016-06-14 NOTE — Assessment & Plan Note (Signed)
BP elevated today - did not take her meds today No changes in medication today Check blood work

## 2016-06-14 NOTE — Assessment & Plan Note (Signed)
Likely related to neuropathy but will check B12 level to make sure she does not also have B12 def

## 2016-06-14 NOTE — Assessment & Plan Note (Signed)
Taking xanax - ok tocontinue to for

## 2016-06-15 ENCOUNTER — Other Ambulatory Visit: Payer: Self-pay | Admitting: Internal Medicine

## 2016-06-15 DIAGNOSIS — R748 Abnormal levels of other serum enzymes: Secondary | ICD-10-CM

## 2016-06-15 DIAGNOSIS — E538 Deficiency of other specified B group vitamins: Secondary | ICD-10-CM

## 2016-10-20 ENCOUNTER — Emergency Department (HOSPITAL_COMMUNITY): Payer: Medicare Other

## 2016-10-20 ENCOUNTER — Emergency Department (HOSPITAL_COMMUNITY)
Admission: EM | Admit: 2016-10-20 | Discharge: 2016-10-20 | Disposition: A | Discharge status: Home / Self Care | Payer: Medicare Other | Attending: Emergency Medicine | Admitting: Emergency Medicine

## 2016-10-20 ENCOUNTER — Encounter (HOSPITAL_COMMUNITY): Payer: Self-pay | Admitting: *Deleted

## 2016-10-20 DIAGNOSIS — Z85048 Personal history of other malignant neoplasm of rectum, rectosigmoid junction, and anus: Secondary | ICD-10-CM | POA: Diagnosis not present

## 2016-10-20 DIAGNOSIS — Z79899 Other long term (current) drug therapy: Secondary | ICD-10-CM | POA: Diagnosis not present

## 2016-10-20 DIAGNOSIS — I10 Essential (primary) hypertension: Secondary | ICD-10-CM | POA: Insufficient documentation

## 2016-10-20 DIAGNOSIS — M79671 Pain in right foot: Secondary | ICD-10-CM | POA: Diagnosis not present

## 2016-10-20 DIAGNOSIS — F17210 Nicotine dependence, cigarettes, uncomplicated: Secondary | ICD-10-CM | POA: Insufficient documentation

## 2016-10-20 NOTE — ED Triage Notes (Signed)
Pt was walking her dog and came back and sat down.  Pt was then not able to get up and bear weight on her right foot.  There a small area of redness with slight edema noted on pt's right foot.

## 2016-10-20 NOTE — Discharge Instructions (Signed)
Please stop wearing your new shoes as this may be causing your pain.  Please monitor your foot to make sure it does not worsen.  If you develop any of the signs we discussed please return to the ED or call your doctor.  If your foot does not improve in 1-2 days or it worsens please schedule an appointment with your doctor.   Please make sure you wear socks with your shoes.    Please consider seeing a podiatrist for foot care.  This is important as you have numbness in your feet.    Your x-rays do not show any fractures today.

## 2016-10-20 NOTE — ED Notes (Signed)
Bed: WTR9 Expected date:  Expected time:  Means of arrival:  Comments: 

## 2016-10-20 NOTE — ED Provider Notes (Signed)
[facility] DEPT Provider Note   CSN: [id number] Arrival date & time: 10/20/16  1307   By signing my name below, I, Eunice Blase, attest that this documentation has been prepared under the direction and in the presence of Wyn Quaker, [place]. Electronically signed, Eunice Blase, ED Scribe. 10/20/16. 3:11 PM.  History   Chief Complaint Chief Complaint  Patient presents with  . Foot Pain    right   The history is provided by the patient and medical records. No language interpreter was used.    [name] is a 61 y.o. female with h/o neuropathy and radiation therapy to treat rectosigmoid cancer presenting to the Emergency Department concerning atraumatic top R foot pain since this morning. Pt believes this may be a problem with her "arch"; she states the pain began after walking her dog this morning. Mild swelling and redness associated. She describes 7/10, constant ache worse with walking. Pt states she has been wearing a new pair of shoes that she obtained on 10/15/2016 daily. No new medications. Pt reports she is prediabetic. Pt states her cancer is in remission and she is finished with radiation and chemotherapy. No foot specialist noted. No numbness or tingling significant from baseline. No fevers or chills. No other complaints at this time.   Past Medical History:  Diagnosis Date  . Adenomatous colon polyp   . Anemia, unspecified   . Cocaine abuse    as recently as 10/21/11  . Depression   . Difficulty sleeping    takes meds to sleep  . Diverticulosis   . Hernia 03/18/2013  . History of transfusion   . Hypertension   . Incontinence of bowel    since colostomy  . Knee fracture, left   . 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 Nonspecific colitis 10/10/11  . Radiation proctitis    mild  . 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 B12 deficiency 06/15/2016  . Anxiety  06/12/2016  . Numbness 06/12/2016  . Type 2 diabetes mellitus with diabetic autonomic neuropathy, without long-term current use of insulin ([place]) 01/10/2016  . Colon perforation ([place]) 01/10/2015  . Bursitis of right shoulder 01/19/2014  . Tibial plateau fracture, left 01/19/2014  . Multiple falls 01/19/2014  . Colostomy malfunction ([place]) 12/09/2012  . Peristomal hernia 04/19/2012  . Depression   . Seborrheic dermatitis 08/09/2010  . PERSONAL HX RECTAL CANCER 06/05/2010  . SMOKER 04/16/2010  . ALLERGIC RHINITIS 04/16/2010  . Peripheral neuropathy ([place]) 12/06/2009  . Essential hypertension 12/06/2009  . Fatigue 12/06/2009  . Diarrhea 12/06/2009    Past Surgical History:  Procedure Laterality Date  . APPENDECTOMY N/A 01/09/2015   Procedure: APPENDECTOMY;  Surgeon: Excell Seltzer, MD;  Location: [facility];  Service: General;  Laterality: N/A;  . COLOSTOMY  2013   [facility]  . FINGER FRACTURE SURGERY Left    and hand surgery  . HERNIA REPAIR    . LAPAROSCOPIC PARASTOMAL HERNIA N/A 05/12/2013   Procedure: LAPAROSCOPIC PARASTOMAL HERNIA;  Surgeon: Leighton Ruff, MD;  Location: [facility];  Service: General;  Laterality: N/A;  . LAPAROTOMY N/A 01/09/2015   Procedure: EXPLORATORY LAPAROTOMY;  Surgeon: Excell Seltzer, MD;  Location: [facility];  Service: General;  Laterality: N/A;  RESECTION OF COLOSTOMY, CREATION OF NEW COLOSTOMY, VENTRAL HERNIA REPAIR WITH BIOLOGIC MESH,  LYSIS OF ADHESIONS , OMENTECTOMY  . LOW ANTERIOR BOWEL RESECTION  03/07/09  .  MOUTH SURGERY  10/2009  . PORTACATH PLACEMENT     and removal    OB History    No data available       Home Medications    Prior to Admission medications   Medication Sig Start Date End Date Taking? Authorizing Provider  ALPRAZolam (XANAX) 0.5 MG tablet TAKE ONE TABLET BY MOUTH TWICE DAILY AS NEEDED FOR ANXIETY OR  SLEEP 06/12/16   Burns, Claudina Lick, MD  gabapentin (NEURONTIN) 600 MG tablet Take 2 tablets (1,200 mg total) by mouth 3 (three) times  daily. 06/12/16   Binnie Rail, MD  hydrochlorothiazide (HYDRODIURIL) 25 MG tablet Take 1 tablet (25 mg total) by mouth daily. 06/12/16   Binnie Rail, MD  lisinopril (PRINIVIL,ZESTRIL) 20 MG tablet Take 1 tablet (20 mg total) by mouth daily as needed. Must keep June appt w/new PCP for future refills 06/12/16   Binnie Rail, MD  metFORMIN (GLUCOPHAGE) 500 MG tablet Take 1 tablet (500 mg total) by mouth daily with breakfast. 06/12/16   Binnie Rail, MD  naproxen sodium (ANAPROX) 220 MG tablet Take 440 mg by mouth 2 (two) times daily as needed (pain).    [provider]    Family History Family History  Problem Relation Age of Onset  . Colon cancer Mother   . Diabetes Father   . Cirrhosis Brother   . Stomach cancer Sister   . Colon polyps Sister   . Pulmonary Hypertension Brother     Social History Social History  Substance Use Topics  . Smoking status: Current Every Day Smoker    Packs/day: 0.25    Years: 40.00    Types: Cigarettes  . Smokeless tobacco: Never Used  . Alcohol use 7.2 oz/week    12 Standard drinks or equivalent per week     Comment: occasional  driink     Allergies   Ampicillin; Hydrocodone; Penicillins; and Grapeseed extract [nutritional supplements]   Review of Systems Review of Systems  Constitutional: Negative for chills and fever.  HENT: Negative for ear pain and trouble swallowing.   Eyes: Negative for visual disturbance.  Respiratory: Negative for shortness of breath.   Cardiovascular: Negative for leg swelling.  Gastrointestinal: Negative for abdominal distention, abdominal pain, nausea and vomiting.  Genitourinary: Negative for difficulty urinating and dysuria.  Musculoskeletal: Positive for arthralgias, gait problem and joint swelling.  Skin: Positive for color change. Negative for wound.  Neurological: Negative for weakness and numbness.     Physical Exam Updated Vital Signs BP (!) 144/90 (BP Location: Right Arm)   Pulse 65    Temp 98.2 F (36.8 C) (Oral)   Resp 14   Ht 5\' 6"  (1.676 m)   Wt 170 lb (77.1 kg)   SpO2 98%   BMI 27.44 kg/m   Physical Exam  Constitutional: She appears well-developed and well-nourished. No distress.  HENT:  Head: Normocephalic and atraumatic.  Eyes: Conjunctivae are normal.  Neck: Neck supple.  Cardiovascular: Normal rate and regular rhythm.   No murmur heard. Pulmonary/Chest: Effort normal and breath sounds normal. No respiratory distress.  Abdominal: Soft. There is no tenderness.  Musculoskeletal: She exhibits no edema.  Feet:  Right Foot:  Skin Integrity: Positive for erythema (2cm area of redness over left dorsum of medial midfoot.  ).  Neurological: She is alert. A sensory deficit (To bilateral feet, consistent with patients normal baseline) is present.  Skin: Skin is warm and dry.  Psychiatric: She has a normal mood and  affect. Her behavior is normal.  Nursing note and vitals reviewed.    ED Treatments / Results  DIAGNOSTIC STUDIES: Oxygen Saturation is 98% on RA, NL by my interpretation.    COORDINATION OF CARE: 3:09 PM-Discussed next steps with pt. Pt verbalized understanding and is agreeable with the plan. Pt prepared for d/c, advised of symptomatic care at home, F/U instructions and return precautions.    Labs (all labs ordered are listed, but only abnormal results are displayed) Labs Reviewed - No data to display  EKG  EKG Interpretation None       Radiology Dg Foot Complete Right  Result Date: 10/20/2016 CLINICAL DATA:  Right foot pain and tenderness without known injury. EXAM: RIGHT FOOT COMPLETE - 3+ VIEW COMPARISON:  None. FINDINGS: There is no evidence of fracture or dislocation. There is no evidence of arthropathy or other focal bone abnormality. Soft tissues are unremarkable. IMPRESSION: Negative. Electronically Signed   By: Misty Stanley M.D.   On: 10/20/2016 14:22    Procedures Procedures (including critical care time)  Medications  Ordered in ED Medications - No data to display   Initial Impression / Assessment and Plan / ED Course  I have reviewed the triage vital signs and the nursing notes.  Pertinent labs & imaging results that were available during my care of the patient were reviewed by me and considered in my medical decision making (see chart for details).    Jacobo Forest presents with right foot pain consistent with pressure from new shoes.  She does not have any obvious signs of infection, drainage or breaks in the skin, no fevers or chills.  X-rays obtained and reviewed, negative for acute fracture or dislocation.  Foot is warm, well perfused. I advised patient to stop wearing her new shoes as I believe they are causing her pain and to wear socks with her shoes. Based on her history of neuropathy I advised her to follow-up with her PCP if her symptoms do not rapidly resolve.  I advised her to obtain a podiatrist as she has neuropathy in her bilateral feet.   At this time there does not appear to be any evidence of an acute emergency medical condition and the patient appears stable for discharge with appropriate outpatient follow up.Diagnosis was discussed with patient who verbalizes understanding and is agreeable to discharge.      Final Clinical Impressions(s) / ED Diagnoses   Final diagnoses:  Right foot pain    New Prescriptions Discharge Medication List as of 10/20/2016  3:15 PM    I personally performed the services described in this documentation, which was scribed in my presence. The recorded information has been reviewed and is accurate.     Ollen Gross 10/20/16 2141    Davonna Belling, MD 10/21/16 [phone]

## 2016-10-23 ENCOUNTER — Other Ambulatory Visit: Payer: Self-pay | Admitting: Internal Medicine

## 2016-10-23 NOTE — Telephone Encounter (Signed)
[place] Controlled Substance Database checked. Okay to fill RX. Last filled 09/15/16. Faxed RX to [facility]

## 2016-11-30 NOTE — Progress Notes (Signed)
Subjective:    Patient ID: [name], female    DOB: Jan 23, 1956, 61 y.o.   MRN: [id number]  HPI Here for an initial medicare wellness exam and follow up of her chronic medical problems.    Hypertension: She is taking her medication daily. She is compliant with a low sodium diet.  She is exercising regularly.  She does not monitor her blood pressure at home.    Diabetes: She is not taking her medication daily as prescribed due to it causing hair loss and weight loss.  She is trying to work on diet.  She is compliant with a diabetic diet. She is exercising regularly - walking daily.  She checks her feet daily and denies foot lesions. She is up-to-date with an ophthalmology examination.   Peripheral neuropathy, secondary to chemo:  She is taking the gabapentin daily as prescribed.  She finds the medication helpful.    Anxiety: She is taking her medication as needed.  She takes it at night.  She denies any side effects from the medication. She feels her anxiety is well controlled and she is happy with her current dose of medication.   She is still smoking.  She wants to quit.  She does not want to try any medication for it.    Hernia:  Related to peristomal hernia has returned.  Occasionally it causes pain.  Her stoma is also sore to touch.  She would like to see surgery to discuss possible repair.   I have personally reviewed and have noted 1.The patient's medical and social history 2.Their use of alcohol, tobacco or illicit drugs 3.Their current medications and supplements 4.The patient's functional ability including ADL's, fall risks, home                 safety risk and hearing or visual impairment. 5.Diet and physical activities 6.Evidence for depression or mood disorders 7.Care team reviewed  -  none    Are there smokers in your home (other than you)? No  Risk Factors Exercise:  walking Dietary issues  discussed:  Compliant with low sugar/carb, low fat and low sodium diet. Well balanced.   Cardiac risk factors: advanced age, hypertension  Depression Screen  Have you felt down, depressed or hopeless? No  Have you felt little interest or pleasure in doing things?  No  Activities of Daily Living In your present state of health, do you have any difficulty performing the following activities?:  Driving? No Managing money?  No Feeding yourself? No Getting from bed to chair? No Climbing a flight of stairs? No Preparing food and eating?: No Bathing or showering? No Getting dressed: No Getting to/using the toilet? No, but does have colostomy Moving around from place to place: No In the past year have you fallen or had a near fall?: yes, tripped  - no injuries   Are you sexually active?  yes  Do you have more than one partner?  no  Hearing Difficulties: No Do you often ask people to speak up or repeat themselves? No Do you experience ringing or noises in your ears? No Do you have difficulty understanding soft or whispered voices? No Vision:              Any change in vision:  no             Up to date with eye exam:   no  Memory:  Do you feel that you have a problem with memory? No  Do you often misplace items? No  Do you feel safe at home?  Yes  Cognitive Testing  Alert, Orientated? Yes  Normal Appearance? Yes  Recall of three objects?  Yes  Can perform simple calculations? Yes  Displays appropriate judgment? Yes  Can read the correct time from a watch face? Yes   Advanced Directives have been discussed with the patient? Yes   Medications and allergies reviewed with patient and updated if appropriate.  Patient Active Problem List   Diagnosis Date Noted  . B12 deficiency 06/15/2016  . Anxiety 06/12/2016  . Numbness 06/12/2016  . Type 2 diabetes mellitus with diabetic autonomic neuropathy, without long-term current use of insulin ([place]) 01/10/2016  . Colon perforation  ([place]) 01/10/2015  . Bursitis of right shoulder 01/19/2014  . Tibial plateau fracture, left 01/19/2014  . Multiple falls 01/19/2014  . Colostomy malfunction ([place]) 12/09/2012  . Peristomal hernia 04/19/2012  . Depression   . Seborrheic dermatitis 08/09/2010  . PERSONAL HX RECTAL CANCER 06/05/2010  . SMOKER 04/16/2010  . ALLERGIC RHINITIS 04/16/2010  . Peripheral neuropathy ([place]) 12/06/2009  . Essential hypertension 12/06/2009  . Fatigue 12/06/2009  . Diarrhea 12/06/2009    Current Outpatient Prescriptions on File Prior to Visit  Medication Sig Dispense Refill  . ALPRAZolam (XANAX) 0.5 MG tablet TAKE 1 TABLET BY MOUTH TWICE DAILY AS NEEDED FOR ANXIETY OR  SLEEP. 60 tablet 1  . gabapentin (NEURONTIN) 600 MG tablet Take 2 tablets (1,200 mg total) by mouth 3 (three) times daily. 540 tablet 1  . hydrochlorothiazide (HYDRODIURIL) 25 MG tablet Take 1 tablet (25 mg total) by mouth daily. 90 tablet 1  . lisinopril (PRINIVIL,ZESTRIL) 20 MG tablet Take 1 tablet (20 mg total) by mouth daily as needed. Must keep June appt w/new PCP for future refills 30 tablet 1  . naproxen sodium (ANAPROX) 220 MG tablet Take 440 mg by mouth 2 (two) times daily as needed (pain).    . metFORMIN (GLUCOPHAGE) 500 MG tablet Take 1 tablet (500 mg total) by mouth daily with breakfast. (Patient not taking: Reported on 12/01/2016) 90 tablet 1   No current facility-administered medications on file prior to visit.     Past Medical History:  Diagnosis Date  . Adenomatous colon polyp   . Anemia, unspecified   . Cocaine abuse    as recently as 10/21/11  . Depression   . Difficulty sleeping    takes meds to sleep  . Diverticulosis   . Hernia 03/18/2013  . History of transfusion   . Hypertension   . Incontinence of bowel    since colostomy  . Knee fracture, left   . 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 Nonspecific colitis 10/10/11  . Radiation proctitis    mild  . 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 APPENDECTOMY N/A 01/09/2015   Procedure: APPENDECTOMY;  Surgeon: Excell Seltzer, MD;  Location: [facility];  Service: General;  Laterality: N/A;  . COLOSTOMY  2013   [facility]  . FINGER FRACTURE SURGERY Left    and hand surgery  . HERNIA REPAIR    . LAPAROSCOPIC PARASTOMAL HERNIA N/A 05/12/2013   Procedure: LAPAROSCOPIC PARASTOMAL HERNIA;  Surgeon: Leighton Ruff, MD;  Location[facility];  Service: General;  Laterality: N/A;  . LAPAROTOMY N/A 01/09/2015   Procedure: EXPLORATORY LAPAROTOMY;  Surgeon: Excell Seltzer, MD;  Location: [facility];  Service: General;  Laterality: N/A;  RESECTION OF COLOSTOMY, CREATION OF NEW COLOSTOMY, VENTRAL HERNIA REPAIR WITH BIOLOGIC MESH,  LYSIS OF ADHESIONS , OMENTECTOMY  . LOW ANTERIOR BOWEL RESECTION  03/07/09  . MOUTH SURGERY  10/2009  . PORTACATH PLACEMENT     and removal    Social History   Social History  . Marital status: Divorced    Spouse name: N/A  . Number of children: 0  . Years of education: N/A   Occupational History  . disabiled    Social History Main Topics  . Smoking status: Current Every Day Smoker    Packs/day: 0.25    Years: 40.00    Types: Cigarettes  . Smokeless tobacco: Never Used  . Alcohol use 7.2 oz/week    12 Standard drinks or equivalent per week     Comment: occasional  driink  . Drug use: Yes    Types: Marijuana, Cocaine     Comment: no longer using Marijuana; last use of cocaine nov 2014   . Sexual activity: Not on file   Other Topics Concern  . 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 Colon cancer Mother   . Diabetes Father   . Cirrhosis Brother   . Stomach cancer Sister   . Colon polyps Sister   . Pulmonary Hypertension Brother      Review of Systems  Constitutional: Negative for chills and fever.       Hot flashes  HENT: Negative for hearing loss and tinnitus.   Respiratory: Negative for cough, shortness of breath and wheezing.   Cardiovascular: Negative for chest pain, palpitations and leg swelling.  Gastrointestinal: Positive for abdominal distention (hernia) and abdominal pain (occasional discomfort). Negative for blood in stool and nausea.  Genitourinary: Negative for dysuria and hematuria.  Neurological: Negative for light-headedness and headaches.  Psychiatric/Behavioral: Positive for dysphoric mood (mild, intermittent). The patient is nervous/anxious.        Objective:   Vitals:   12/01/16 0758  BP: (!) 170/84  Pulse: 87  Resp: 16  Temp: 98.9 F (37.2 C)  SpO2: 97%   Filed Weights   12/01/16 0758  Weight: 165 lb (74.8 kg)   Body mass index is 26.63 kg/m.  Wt Readings from Last 3 Encounters:  12/01/16 165 lb (74.8 kg)  10/20/16 170 lb (77.1 kg)  06/12/16 172 lb (78 kg)     Physical Exam Constitutional: Appears well-developed and well-nourished. No distress.  HENT:  Head: Normocephalic and atraumatic.  Neck: Neck supple. No tracheal deviation present. No thyromegaly present.  No cervical lymphadenopathy Cardiovascular: Normal rate, regular rhythm and normal heart sounds.   No murmur heard. No carotid bruit .  No edema Pulmonary/Chest: Effort normal and breath sounds normal. No respiratory distress. No has no wheezes. No rales.  Abdomen: soft, distended, hernia on left side that is reducible, minimal tenderness, colostomy in place - functioning normal, no signs of infection in surrounding skin Skin: Skin is warm and dry. Not diaphoretic.  Psychiatric: Normal mood and affect. Behavior is normal.    Diabetic Foot Exam - Simple   Simple Foot Form Diabetic Foot exam was performed with the following findings:  Yes 12/01/2016  8:36 AM  Visual Inspection No deformities, no ulcerations, no  other skin breakdown bilaterally:  Yes Sensation  Testing See comments:  Yes Pulse Check Posterior Tibialis and Dorsalis pulse intact bilaterally:  Yes Comments No sensation in b/l plantar surfaces of feet         Assessment & Plan:   Wellness Exam: Immunizations  discussed flu and shingrix, others up to date Colonoscopy  Up to date  Mammogram   No up to date - will schedule Dexa   Up to date,  Normal Gyn  - no longer sees Eye exam  No up to date - will schedule Hearing loss  None   Memory concerns/difficulties  None, except those normal for age Independent of ADLs  fully Stressed the importance of regular exercise   Patient received copy of preventative screening tests/immunizations recommended for the next 5-10 years.   See Problem List for Assessment and Plan of chronic medical problems.   FU in 6 months

## 2016-11-30 NOTE — Patient Instructions (Addendum)
  [name] , Thank you for taking time to come for your Medicare Wellness Visit. I appreciate your ongoing commitment to your health goals. Please review the following plan we discussed and let me know if I can assist you in the future.   These are the goals we discussed: Goals    Schedule a mammogram and eye exam  Consider getting a shingles vaccine    This is a list of the screening recommended for you and due dates:  Health Maintenance  Topic Date Due  . Eye exam for diabetics  06/09/1966  . Pap Smear  12/12/2013  . Mammogram  08/16/2014  . Flu Shot  11/12/2016  . Hemoglobin A1C  12/13/2016  . Complete foot exam   01/17/2017  . Colon Cancer Screening  01/09/2020  . Tetanus Vaccine  05/01/2020  . DEXA scan (bone density measurement)  12/04/2020  . Pneumococcal vaccine (2) 06/12/2021  .  Hepatitis C: One time screening is recommended by Center for Disease Control  (CDC) for  adults born from [birth year] through 1964.   Completed  . HIV Screening  Completed    Fasting sugar 100-120.   BP goal < 130/80.  Test(s) ordered today. Your results will be released to [place] (or called to you) after review, usually within 72hours after test completion. If any changes need to be made, you will be notified at that same time.  All other Health Maintenance issues reviewed.   All recommended immunizations and age-appropriate screenings are up-to-date or discussed.  No immunizations administered today.   Medications reviewed and updated.  Changes include starting lisinopril 10 mg daily.  Monitor your BP    Your prescription(s) have been given to you. Please take as directed and contact our office if you believe you are having problem(s) with the medication(s).  A referral was ordered for surgery  Please followup in 6 months

## 2016-12-01 ENCOUNTER — Encounter: Payer: Self-pay | Admitting: Internal Medicine

## 2016-12-01 ENCOUNTER — Other Ambulatory Visit (INDEPENDENT_AMBULATORY_CARE_PROVIDER_SITE_OTHER): Payer: Medicare Other

## 2016-12-01 ENCOUNTER — Ambulatory Visit (INDEPENDENT_AMBULATORY_CARE_PROVIDER_SITE_OTHER): Payer: Medicare Other | Admitting: Internal Medicine

## 2016-12-01 VITALS — BP 170/84 | HR 87 | Temp 98.9°F | Resp 16 | Ht 66.0 in | Wt 165.0 lb

## 2016-12-01 DIAGNOSIS — I10 Essential (primary) hypertension: Secondary | ICD-10-CM | POA: Diagnosis not present

## 2016-12-01 DIAGNOSIS — Z0000 Encounter for general adult medical examination without abnormal findings: Secondary | ICD-10-CM | POA: Diagnosis not present

## 2016-12-01 DIAGNOSIS — E1143 Type 2 diabetes mellitus with diabetic autonomic (poly)neuropathy: Secondary | ICD-10-CM

## 2016-12-01 DIAGNOSIS — K469 Unspecified abdominal hernia without obstruction or gangrene: Secondary | ICD-10-CM | POA: Diagnosis not present

## 2016-12-01 DIAGNOSIS — G6289 Other specified polyneuropathies: Secondary | ICD-10-CM | POA: Diagnosis not present

## 2016-12-01 DIAGNOSIS — F419 Anxiety disorder, unspecified: Secondary | ICD-10-CM

## 2016-12-01 LAB — HEMOGLOBIN A1C: Hgb A1c MFr Bld: 6.5 % (ref 4.6–6.5)

## 2016-12-01 LAB — MICROALBUMIN / CREATININE URINE RATIO
Creatinine,U: 210.2 mg/dL
Microalb Creat Ratio: 5.2 mg/g (ref 0.0–30.0)
Microalb, Ur: 11 mg/dL — ABNORMAL HIGH (ref 0.0–1.9)

## 2016-12-01 LAB — COMPREHENSIVE METABOLIC PANEL
ALT: 28 U/L (ref 0–35)
AST: 34 U/L (ref 0–37)
Albumin: 3.8 g/dL (ref 3.5–5.2)
Alkaline Phosphatase: 117 U/L (ref 39–117)
BUN: 15 mg/dL (ref 6–23)
CO2: 17 mEq/L — ABNORMAL LOW (ref 19–32)
Calcium: 8.9 mg/dL (ref 8.4–10.5)
Chloride: 104 mEq/L (ref 96–112)
Creatinine, Ser: 0.74 mg/dL (ref 0.40–1.20)
GFR: 102.79 mL/min (ref >60.00)
Glucose, Bld: 150 mg/dL — ABNORMAL HIGH (ref 70–99)
Potassium: 3.6 mEq/L (ref 3.5–5.1)
Sodium: 132 mEq/L — ABNORMAL LOW (ref 135–145)
Total Bilirubin: 0.5 mg/dL (ref 0.2–1.2)
Total Protein: 7.2 g/dL (ref 6.0–8.3)

## 2016-12-01 MED ORDER — GABAPENTIN 600 MG PO TABS: 1200.0000 mg | ORAL_TABLET | Freq: Three times a day (TID) | ORAL | 1 refills | Status: DC

## 2016-12-01 MED ORDER — LISINOPRIL 10 MG PO TABS: 10.0000 mg | ORAL_TABLET | Freq: Every day | ORAL | 3 refills | Status: DC

## 2016-12-01 NOTE — Assessment & Plan Note (Signed)
Stopped metformin - did not tolerate it Complaint with diabetic diet, exercising Check a1c May need another medication - will see what a1c is

## 2016-12-01 NOTE — Assessment & Plan Note (Signed)
Recurred Will refer to surgery for further evaluation

## 2016-12-01 NOTE — Assessment & Plan Note (Signed)
Controlled Taking 600 mg TID Checks feet daily

## 2016-12-01 NOTE — Assessment & Plan Note (Signed)
Elevated here - not controlled Start lisinopril 10 mg daily Continue hctz 25 mg daily Monitor BP - goal < 130/80 cmp today

## 2016-12-01 NOTE — Assessment & Plan Note (Signed)
Takes xanax as needed  no side effects , works well  Will continue

## 2017-01-07 DIAGNOSIS — K432 Incisional hernia without obstruction or gangrene: Secondary | ICD-10-CM | POA: Diagnosis not present

## 2017-01-09 ENCOUNTER — Other Ambulatory Visit: Payer: Self-pay | Admitting: General Surgery

## 2017-01-09 DIAGNOSIS — K435 Parastomal hernia without obstruction or  gangrene: Secondary | ICD-10-CM

## 2017-01-22 ENCOUNTER — Ambulatory Visit
Admission: RE | Admit: 2017-01-22 | Discharge: 2017-01-22 | Disposition: A | Discharge status: Home / Self Care | Payer: Medicare Other | Source: Ambulatory Visit | Attending: General Surgery | Admitting: General Surgery

## 2017-01-22 DIAGNOSIS — K5730 Diverticulosis of large intestine without perforation or abscess without bleeding: Secondary | ICD-10-CM | POA: Diagnosis not present

## 2017-01-22 DIAGNOSIS — K435 Parastomal hernia without obstruction or  gangrene: Secondary | ICD-10-CM

## 2017-01-22 MED ORDER — IOPAMIDOL (ISOVUE-300) INJECTION 61%
100.0000 mL | Freq: Once | INTRAVENOUS | Status: AC | PRN
  Administered 2017-01-22: 100 mL via INTRAVENOUS

## 2017-02-17 ENCOUNTER — Other Ambulatory Visit: Payer: Self-pay | Admitting: Internal Medicine

## 2017-02-18 NOTE — Telephone Encounter (Signed)
RX faxed to [facility] 

## 2017-02-18 NOTE — Telephone Encounter (Signed)
[place] Controlled Substance Database checked. Last filled on 12/12/16

## 2017-03-19 ENCOUNTER — Other Ambulatory Visit: Payer: Self-pay | Admitting: Internal Medicine

## 2017-03-25 ENCOUNTER — Other Ambulatory Visit: Payer: Self-pay

## 2017-03-25 ENCOUNTER — Emergency Department (HOSPITAL_COMMUNITY)
Admission: EM | Admit: 2017-03-25 | Discharge: 2017-03-25 | Disposition: A | Discharge status: Home / Self Care | Payer: Medicare Other | Attending: Physician Assistant | Admitting: Physician Assistant

## 2017-03-25 ENCOUNTER — Encounter (HOSPITAL_COMMUNITY): Payer: Self-pay

## 2017-03-25 ENCOUNTER — Ambulatory Visit: Payer: Self-pay | Admitting: *Deleted

## 2017-03-25 ENCOUNTER — Emergency Department (HOSPITAL_COMMUNITY): Payer: Medicare Other

## 2017-03-25 DIAGNOSIS — S8992XA Unspecified injury of left lower leg, initial encounter: Secondary | ICD-10-CM | POA: Diagnosis not present

## 2017-03-25 DIAGNOSIS — Z79899 Other long term (current) drug therapy: Secondary | ICD-10-CM | POA: Insufficient documentation

## 2017-03-25 DIAGNOSIS — W000XXA Fall on same level due to ice and snow, initial encounter: Secondary | ICD-10-CM | POA: Diagnosis not present

## 2017-03-25 DIAGNOSIS — T148XXA Other injury of unspecified body region, initial encounter: Secondary | ICD-10-CM | POA: Diagnosis not present

## 2017-03-25 DIAGNOSIS — F17210 Nicotine dependence, cigarettes, uncomplicated: Secondary | ICD-10-CM | POA: Diagnosis not present

## 2017-03-25 DIAGNOSIS — Y939 Activity, unspecified: Secondary | ICD-10-CM | POA: Insufficient documentation

## 2017-03-25 DIAGNOSIS — Y929 Unspecified place or not applicable: Secondary | ICD-10-CM | POA: Insufficient documentation

## 2017-03-25 DIAGNOSIS — Y999 Unspecified external cause status: Secondary | ICD-10-CM | POA: Diagnosis not present

## 2017-03-25 DIAGNOSIS — I10 Essential (primary) hypertension: Secondary | ICD-10-CM | POA: Insufficient documentation

## 2017-03-25 DIAGNOSIS — E1140 Type 2 diabetes mellitus with diabetic neuropathy, unspecified: Secondary | ICD-10-CM | POA: Diagnosis not present

## 2017-03-25 DIAGNOSIS — W19XXXA Unspecified fall, initial encounter: Secondary | ICD-10-CM

## 2017-03-25 DIAGNOSIS — M25562 Pain in left knee: Secondary | ICD-10-CM | POA: Diagnosis not present

## 2017-03-25 MED ORDER — IBUPROFEN 800 MG PO TABS
800.0000 mg | ORAL_TABLET | Freq: Once | ORAL | Status: AC
  Administered 2017-03-25: 800 mg via ORAL
  Filled 2017-03-25: qty 1

## 2017-03-25 MED ORDER — OXYCODONE-ACETAMINOPHEN 5-325 MG PO TABS: 1.0000 | ORAL_TABLET | Freq: Four times a day (QID) | ORAL | 0 refills | Status: AC | PRN

## 2017-03-25 MED ORDER — FENTANYL CITRATE (PF) 100 MCG/2ML IJ SOLN: 50.0000 ug | Freq: Once | INTRAMUSCULAR | Status: DC

## 2017-03-25 MED ORDER — OXYCODONE-ACETAMINOPHEN 5-325 MG PO TABS
1.0000 | ORAL_TABLET | Freq: Once | ORAL | Status: AC
  Administered 2017-03-25: 1 via ORAL
  Filled 2017-03-25: qty 1

## 2017-03-25 NOTE — ED Triage Notes (Signed)
EMS reports witnessed fall from standing on ice, landed on left knee, c/o pain 8/10. No LOC. No obvious injuries. Pt states she had small bowel movent and only normally passes mucus. Has colostomy bag right lower abdomen. Pt states she took 1gm Tylenol @ 0930  BP 120/80 HR 88 Resp 18 sp02 100 RA

## 2017-03-25 NOTE — Telephone Encounter (Signed)
Patient had fall last night- having pain this morning- will call EMS or family member to take her to ED  Reason for Disposition . Can't stand (bear weight) or walk  Answer Assessment - Initial Assessment Questions 1. MECHANISM: "How did the injury happen?" (e.g., twisting injury, direct blow)      Fell last night on the ice- hit her bottom- but her L leg hurts 2. ONSET: "When did the injury happen?" (Minutes or hours ago)      Last night 3. LOCATION: "Where is the injury located?"      Left leg 4. APPEARANCE of INJURY: "What does the injury look like?"  (e.g., deformity of leg)     No swelling that she can tell 5. SEVERITY: "Can you put weight on that leg?" "Can you walk?"      Barely can put weight on it- it is painful 6. SIZE: For cuts, bruises, or swelling, ask: "How large is it?" (e.g., inches or centimeters)      No- sore on bottom- 2 small sores on kneecap 7. PAIN: "Is there pain?" If so, ask: "How bad is the pain?"  (Scale 1-10; or mild, moderate, severe)     Yes- 8 8. TETANUS: For any breaks in the skin, ask: "When was the last tetanus booster?"     yes 9. OTHER SYMPTOMS: "Do you have any other symptoms?"      No- other symptoms 10. PREGNANCY: "Is there any chance you are pregnant?" "When was your last menstrual period?"       n/a  Protocols used: LEG INJURY-A-AH

## 2017-03-25 NOTE — Telephone Encounter (Signed)
Noted. Pt was seen at ED today

## 2017-03-25 NOTE — Discharge Instructions (Addendum)
Alternate Tylenol and Ibuprofen for pain. Prescription also given for Percocet for breakthrough pain. Do not exceed 4g of Tylenol (acetaminophen) per day. Take medication as directed and do not operate machinery, drive a car, or work while taking this medication as it can make you drowsy.   Elevate the affected area. Alternate using warm and/or cold compresses for pain. Apply compress for 20-30 minutes about 2-3 times per day.   It is important that you follow up with your orthopedist within the next 3-7 days for further evaluation of the left knee injury. Use crutches when ambulating and wear knee immobilizer until you are seen by orthopedics. Do not bear weight on your left knee until your are seen by your orthopedist.   If you have any new, worsening, or concerning symptoms return to the ED.

## 2017-03-25 NOTE — ED Provider Notes (Signed)
[name] DEPT Provider Note   CSN: [id number] Arrival date & time: 03/25/17  1025     History   Chief Complaint Chief Complaint  Patient presents with  . Fall    HPI [name] is a 61 y.o. female.  HPI  Pt with a h/o left knee fracture presents to the ED c/o left knee pain that began last night after she fell. Pt states that she slipped on ice and fell on her buttocks yesterday around 10:30pm. Pt denies any pain to her buttocks, neck, or back, but reports that she has had constant left knee pain since she fell. The pain is sharp in nature and she rates it at a 9/10.  She was able to stand after the fall with assistance, but has had difficulty bearing weight on the left knee secondary to pain. She states that she was able walk to the restroom and get herself dressed this morning without assistance. She has taken Tylenol, which has not resolved her pain.  She further denies any other pain, head trauma, LOC, amnesia of the event, saddle anesthesia, bowel or bladder incontinence, or urinary retention. She denies any prodrome to the fall such as tunnel vision, nausea, lightheadedness, CP, or SOB.   Pt has a colostomy bag and noted this morning that she had a few small drops of stool in her briefs. She was concerned because normally only passes mucous. She denies any bloody or tarry appearing stools.     Past Medical History:  Diagnosis Date  . Adenomatous colon polyp   . Anemia, unspecified   . Cocaine abuse ([place])    as recently as 10/21/11  . Depression   . Difficulty sleeping    takes meds to sleep  . Diverticulosis   . Hernia 03/18/2013  . History of transfusion   . Hypertension   . Incontinence of bowel    since colostomy  . Knee fracture, left   . 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 Nonspecific colitis 10/10/11  . Radiation proctitis    mild  . 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 B12 deficiency 06/15/2016  . Anxiety 06/12/2016  . Type 2 diabetes mellitus with diabetic autonomic neuropathy, without long-term current use of insulin ([place]) 01/10/2016  . Colon perforation ([place]) 01/10/2015  . Bursitis of right shoulder 01/19/2014  . Tibial plateau fracture, left 01/19/2014  . Multiple falls 01/19/2014  . Colostomy malfunction ([place]) 12/09/2012  . Peristomal hernia 04/19/2012  . Seborrheic dermatitis 08/09/2010  . PERSONAL HX RECTAL CANCER 06/05/2010  . SMOKER 04/16/2010  . ALLERGIC RHINITIS 04/16/2010  . Peripheral neuropathy ([place]) 12/06/2009  . Essential hypertension 12/06/2009  . Fatigue 12/06/2009  . Diarrhea 12/06/2009    Past Surgical History:  Procedure Laterality Date  . APPENDECTOMY N/A 01/09/2015   Procedure: APPENDECTOMY;  Surgeon: Excell Seltzer, MD;  Location: [facility];  Service: General;  Laterality: N/A;  . COLOSTOMY  2013   [facility]  . FINGER FRACTURE SURGERY Left    and hand surgery  . HERNIA REPAIR    . LAPAROSCOPIC PARASTOMAL HERNIA N/A 05/12/2013   Procedure: LAPAROSCOPIC PARASTOMAL HERNIA;  Surgeon: Leighton Ruff, MD;  [facility]: [facility];  Service: General;  Laterality: N/A;  . LAPAROTOMY N/A 01/09/2015   Procedure: EXPLORATORY LAPAROTOMY;  Surgeon: Excell Seltzer, MD;  Location: [facility];  Service: General;  Laterality: N/A;  RESECTION OF COLOSTOMY, CREATION OF NEW COLOSTOMY, VENTRAL HERNIA REPAIR WITH BIOLOGIC MESH,  LYSIS OF ADHESIONS , OMENTECTOMY  . LOW ANTERIOR BOWEL RESECTION  03/07/09  . MOUTH SURGERY  10/2009  . PORTACATH PLACEMENT     and removal    OB History    No data available       Home Medications    Prior to Admission medications   Medication Sig Start Date End Date Taking? Authorizing Provider  ALPRAZolam (XANAX) 0.5 MG tablet TAKE 1 TABLET BY MOUTH TWICE DAILY AS NEEDED FOR ANXIETY OR SLEEP 02/18/17   Burns, Claudina Lick, MD  gabapentin (NEURONTIN) 600 MG tablet  Take 2 tablets (1,200 mg total) by mouth 3 (three) times daily. 12/01/16   Binnie Rail, MD  hydrochlorothiazide (HYDRODIURIL) 25 MG tablet Take 1 tablet (25 mg total) by mouth daily. 06/12/16   Binnie Rail, MD  lisinopril (PRINIVIL,ZESTRIL) 10 MG tablet Take 1 tablet (10 mg total) by mouth daily. 12/01/16   Binnie Rail, MD  naproxen sodium (ANAPROX) 220 MG tablet Take 440 mg by mouth 2 (two) times daily as needed (pain).    [provider]  oxyCODONE-acetaminophen (PERCOCET/ROXICET) 5-325 MG tablet Take 1 tablet by mouth every 6 (six) hours as needed for up to 5 days for severe pain. 03/25/17 03/30/17  Marlayna Bannister S, PA-C    Family History Family History  Problem Relation Age of Onset  . Colon cancer Mother   . Diabetes Father   . Cirrhosis Brother   . Stomach cancer Sister   . Colon polyps Sister   . Pulmonary Hypertension Brother     Social History Social History   Tobacco Use  . Smoking status: Current Every Day Smoker    Packs/day: 0.25    Years: 40.00    Pack years: 10.00    Types: Cigarettes  . Smokeless tobacco: Never Used  Substance Use Topics  . Alcohol use: Yes    Alcohol/week: 7.2 oz    Types: 12 Standard drinks or equivalent per week    Comment: occasional  driink  . Drug use: Yes    Types: Marijuana, Cocaine    Comment: no longer using Marijuana; last use of cocaine nov 2014      Allergies   Ampicillin; Hydrocodone; Metformin and related; Penicillins; and Grapeseed extract [nutritional supplements]   Review of Systems Review of Systems  Constitutional: Negative for chills and fever.  HENT: Negative for ear pain and sore throat.   Eyes: Negative for pain and visual disturbance.  Respiratory: Negative for cough and shortness of breath.   Cardiovascular: Negative for chest pain and palpitations.  Gastrointestinal: Negative for abdominal pain, blood in stool, nausea and vomiting.       Small amount of stool in briefs  Genitourinary:  Negative for dysuria and hematuria.  Musculoskeletal: Negative for arthralgias and back pain.       Left knee pain. No neck pain  Skin: Negative for color change and rash.  Neurological: Negative for dizziness, seizures, syncope, weakness, light-headedness, numbness and headaches (mild).       No LOC. No ataxia. No numbness/weakness to the BLE. No bowel or bladder incontinence.   All other systems reviewed and are negative.    Physical Exam Updated Vital Signs BP (!) 144/89   Pulse 70   Temp 98.1 F (36.7 C) (Oral) Comment: Simultaneous filing. User may not have seen previous data. Comment (Src): Simultaneous filing. User may not have  seen previous data.  Resp 16   Ht 5\' 6"  (1.676 m)   Wt 74.8 kg (165 lb)   SpO2 100%   BMI 26.63 kg/m   Physical Exam  Constitutional: She is oriented to person, place, and time. She appears well-developed and well-nourished. She appears distressed (mild).  HENT:  Head: Normocephalic and atraumatic.  Eyes: Conjunctivae and EOM are normal. Pupils are equal, round, and reactive to light.  Neck: Normal range of motion. Neck supple.  No c-spine tenderness.  Cardiovascular: Normal rate, regular rhythm, normal heart sounds and intact distal pulses.  Pulmonary/Chest: Effort normal and breath sounds normal. No respiratory distress. She has no wheezes.  Abdominal: Soft. Bowel sounds are normal. She exhibits no distension. There is no tenderness.  Colostomy back present  Musculoskeletal: She exhibits no edema.  Left knee. TTP diffusely to the knee. Mild effusion present. No bruising, abrasions, or lacerations noted. Pt has pain with ROM of the knee but is able to flex and extend. Strength 5/5 to the upper and lower extremities bilaterally. DP pulses intact bilaterally. No swelling or tenderness to the calves bilaterally. No TTP to the thoracic or lumbar spine. No TTP to the iliac crest, SI joint, or trochanteric bursae.  Neurological: She is alert and oriented  to person, place, and time.  Mental Status:  Alert, thought content appropriate, able to give a coherent history. Speech fluent without evidence of aphasia. Able to follow 2 step commands without difficulty.  Cranial Nerves:  II:  pupils equal, round, reactive to light III,IV, VI: ptosis not present, extra-ocular motions intact bilaterally  V,VII: smile symmetric VIII: hearing grossly normal to voice  XI: bilateral shoulder shrug symmetric and strong Sensory: light touch normal in all extremities.  Skin: Skin is warm and dry.  Psychiatric: She has a normal mood and affect.  Nursing note and vitals reviewed.    ED Treatments / Results  Labs (all labs ordered are listed, but only abnormal results are displayed) Labs Reviewed - No data to display  EKG  EKG Interpretation None       Radiology Dg Knee Complete 4 Views Left  Result Date: 03/25/2017 CLINICAL DATA:  Painful left knee after a fall. EXAM: LEFT KNEE - COMPLETE 4+ VIEW COMPARISON:  Left knee series of January 16, 2014 FINDINGS: The bones are subjectively adequately mineralized. There is depression of the lateral tibial plateau which appears unchanged since the 2015 study. The medial tibial plateau is normal. The proximal fibula is intact. The femoral condyles and the patella are intact. There is no significant joint space loss. There is no definite effusion. 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 No evidence of acute re-fracture. If the patient'Griffin symptoms warrant further evaluation, MRI would be the most useful next imaging step. Electronically Signed   By: David  Martinique M.D.   On: 03/25/2017 11:52    Procedures Procedures (including critical care time)  Medications Ordered in ED Medications  ibuprofen (ADVIL,MOTRIN) tablet 800 mg (800 mg Oral Given 03/25/17 1208)  oxyCODONE-acetaminophen (PERCOCET/ROXICET) 5-325 MG per tablet 1 tablet (1 tablet Oral Given 03/25/17 1310)     Initial Impression /  Assessment and Plan / ED Course  I have reviewed the triage vital signs and the nursing notes.  Pertinent labs & imaging results that were available during my care of the patient were reviewed by me and considered in my medical decision making (see chart for details).    1128 Re-evaluated pt. She is still c/o left  knee pain. X-ray in room.  Re-eval of pt after receiving Tylenol. She is still c/o of pain and states that pain is not improved at all.  Re-eval of pt after receiving Percocet. Pt still reports pain, but states it has improved to a 2/10. Attempted to have the pt stand and ambulate. Pt took one step, but c/o pain and was somewhat unstable secondary to pain. Pt requesting to be discharged.   1400 Re-evaluated pt with Dr. Thomasene Lot. Discussed the x-ray findings and that an MRI will be necessary to completely rule out a tibial plateau fracture. Discussed the option to complete the MRI in the ED or complete imaging on an outpatient basis. The patient would like to be discharged at this time and requests to complete the MRI on an outpatient basis with her regular orthopedist. I advised the patient to be non-weight bearing on the left knee until she is seen by the orthopedist. I will her home with crutches and a knee immobilizer. Advised her to use these whenever she ambulates and to f/u with ortho ASAP. Return precautions given for any new or worsening symptoms.   Rechecked pt after application of knee immobilizer. Pain has improved since application of splint. Pedal pulses intact. Reiterated the importance of following up with ortho and keeping her left lower extremity non weight bearing. She voiced an understanding and agrees to f/u as advised. All questions answered.   Final Clinical Impressions(Griffin) / ED Diagnoses   Final diagnoses:  Fall, initial encounter  Acute pain of left knee   Pt presenting to the ED with left knee pain Griffin/p fall. Left knee x-ray unable to rule out tibial plateau  fracture definitively. MRI suggested by radiology. Pt prefers to obtain MRI on outpatient basis. Pt has had stable vitals signs throughout visit and is neurovascularly intact to the LLE. Pt voiced understand and agrees to follow up with ortho and remain non-weight bearing on left lower extremity. Advised return to ER for any new or concerning symptoms.   ED Discharge Orders        Ordered    oxyCODONE-acetaminophen (PERCOCET/ROXICET) 5-325 MG tablet  Every 6 hours PRN     03/25/17 [address], Kimberly Folger S, PA-C 03/25/17 1815    Macarthur Critchley, MD 03/26/17 [phone]

## 2017-04-02 NOTE — Progress Notes (Signed)
Subjective:    Patient ID: [name], female    DOB: 26-Jun-1955, 61 y.o.   MRN: [id number]  HPI The patient is here for follow up from the ED.  She slipped on ice and fell on her buttocks the night before going to the ED.  She complained of left knee pain that was constant since the fall.  The pain was sharp and 9/10.  She had difficulty bearing weight on the left knee.  She denied other injuries from the fall.  She did mention she had a few drops of blood in her underwear.  She has a colostomy.   Her knee xray showed a chronic depression of the lateral tibial plateau.  No evidence of acute fracture.  MRI may be needed.   She had fractured her left knee in the past.    She had a percocet and wished to be discharged and follow up with orthopedics for possible MRI.  She was advised to be non weight bearing until she saw ortho.  She was sent home with crutches and a knee immobilizer.  She was discharged home with a prescription for percocet.    She has been taking percocet once a night.  She has pain but can tolerate it.  She is wearing the brace, but is walking on it.  She uses the crutches 3/4 of the time.  The knee is tender on the superior end of the patella and medial inferior aspect of the knee. She denies swelling.   She has not seen ortho or called them to schedule.  She was hoping I could order the MRI.    Anxiety: She is taking her medication daily as prescribed. She denies any side effects from the medication. She feels her anxiety is well controlled and she is happy with her current dose of medication.    Medications and allergies reviewed with patient and updated if appropriate.  Patient Active Problem List   Diagnosis Date Noted  . B12 deficiency 06/15/2016  . Anxiety 06/12/2016  . Type 2 diabetes mellitus with diabetic autonomic neuropathy, without long-term current use of insulin ([place]) 01/10/2016  . Colon perforation ([place]) 01/10/2015  . Bursitis of right shoulder  01/19/2014  . Tibial plateau fracture, left 01/19/2014  . Multiple falls 01/19/2014  . Colostomy malfunction ([place]) 12/09/2012  . Peristomal hernia 04/19/2012  . Seborrheic dermatitis 08/09/2010  . PERSONAL HX RECTAL CANCER 06/05/2010  . SMOKER 04/16/2010  . ALLERGIC RHINITIS 04/16/2010  . Peripheral neuropathy ([place]) 12/06/2009  . Essential hypertension 12/06/2009  . Fatigue 12/06/2009  . Diarrhea 12/06/2009    Current Outpatient Medications on File Prior to Visit  Medication Sig Dispense Refill  . ALPRAZolam (XANAX) 0.5 MG tablet TAKE 1 TABLET BY MOUTH TWICE DAILY AS NEEDED FOR ANXIETY OR SLEEP 60 tablet 1  . gabapentin (NEURONTIN) 600 MG tablet Take 2 tablets (1,200 mg total) by mouth 3 (three) times daily. 540 tablet 1  . hydrochlorothiazide (HYDRODIURIL) 25 MG tablet Take 1 tablet (25 mg total) by mouth daily. 90 tablet 1  . lisinopril (PRINIVIL,ZESTRIL) 10 MG tablet Take 1 tablet (10 mg total) by mouth daily. 90 tablet 3  . naproxen sodium (ANAPROX) 220 MG tablet Take 440 mg by mouth 2 (two) times daily as needed (pain).     No current facility-administered medications on file prior to visit.     Past Medical History:  Diagnosis Date  . Adenomatous colon polyp   . Anemia, unspecified   .  Cocaine abuse ([place])    as recently as 10/21/11  . Depression   . Difficulty sleeping    takes meds to sleep  . Diverticulosis   . Hernia 03/18/2013  . History of transfusion   . Hypertension   . Incontinence of bowel    since colostomy  . Knee fracture, left   . 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 Nonspecific colitis 10/10/11  . Radiation proctitis    mild  . 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 APPENDECTOMY N/A 01/09/2015   Procedure: APPENDECTOMY;  Surgeon: Excell Seltzer, MD;  [facility];  Service: General;  Laterality: N/A;  . COLOSTOMY  2013   [facility]  . FINGER FRACTURE  SURGERY Left    and hand surgery  . HERNIA REPAIR    . LAPAROSCOPIC PARASTOMAL HERNIA N/A 05/12/2013   Procedure: LAPAROSCOPIC PARASTOMAL HERNIA;  Surgeon: Leighton Ruff, MD;  [facility];  Service: General;  Laterality: N/A;  . LAPAROTOMY N/A 01/09/2015   Procedure: EXPLORATORY LAPAROTOMY;  Surgeon: Excell Seltzer, MD;  Location: [facility];  Service: General;  Laterality: N/A;  RESECTION OF COLOSTOMY, CREATION OF NEW COLOSTOMY, VENTRAL HERNIA REPAIR WITH BIOLOGIC MESH,  LYSIS OF ADHESIONS , OMENTECTOMY  . LOW ANTERIOR BOWEL RESECTION  03/07/09  . MOUTH SURGERY  10/2009  . PORTACATH PLACEMENT     and removal    Social History   Socioeconomic History  . Marital status: Divorced    Spouse name: Not on file  . Number of children: 0  . Years of education: Not on file  . Highest education level: Not on file  Social Needs  . Financial resource strain: Not on file  . Food insecurity - worry: Not on file  . Food insecurity - inability: Not on file  . Transportation needs - medical: Not on file  . Transportation needs - non-medical: Not on file  Occupational History  . Occupation: disabiled  Tobacco Use  . Smoking status: Current Every Day Smoker    Packs/day: 0.25    Years: 40.00    Pack years: 10.00    Types: Cigarettes  . Smokeless tobacco: Never Used  Substance and Sexual Activity  . Alcohol use: Yes    Alcohol/week: 7.2 oz    Types: 12 Standard drinks or equivalent per week    Comment: occasional  driink  . Drug use: Yes    Types: Marijuana, Cocaine    Comment: no longer using Marijuana; last use of cocaine nov 2014   . Sexual activity: Not on file  Other Topics Concern  . 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 Colon cancer Mother   . Diabetes Father   . Cirrhosis Brother     . Stomach cancer Sister   . Colon polyps Sister   . Pulmonary Hypertension Brother     Review of Systems  Constitutional: Negative for chills and fever.  Respiratory: Negative for shortness of breath.   Cardiovascular: Negative for chest pain, palpitations and leg swelling.  Musculoskeletal: Negative for joint swelling.  Neurological: Negative for numbness.       Objective:   Vitals:   04/03/17 1311  BP: 124/70  Pulse: (!) 104  Resp: 16  Temp: 98 F (36.7 C)  SpO2: 97%   Wt Readings from Last 3 Encounters:  03/25/17 165 lb (74.8 kg)  12/01/16 165 lb (74.8 kg)  10/20/16 170 lb (77.1 kg)   There is no height or weight on file to calculate BMI.   Physical Exam     A left knee exam was performed.   SKIN: healing abrasion lower anterior left knee SWELLING: no  EFFUSION: no  WARMTH: no warmth  TENDERNESS: mild tenderness on superior patella and medial inferior aspect of knee  ROM: full extension, decreased flexion with pain GAIT: walks with a limp - wearing brace  NEUROLOGICAL EXAM: normal sensation  CALF TENDERNESS: no        Assessment & Plan:   Flu vaccine today  See Problem List for Assessment and Plan of chronic medical problems.

## 2017-04-03 ENCOUNTER — Encounter: Payer: Self-pay | Admitting: Internal Medicine

## 2017-04-03 ENCOUNTER — Ambulatory Visit (INDEPENDENT_AMBULATORY_CARE_PROVIDER_SITE_OTHER): Payer: Medicare Other | Admitting: Internal Medicine

## 2017-04-03 VITALS — BP 124/70 | HR 104 | Temp 98.0°F | Resp 16

## 2017-04-03 DIAGNOSIS — F419 Anxiety disorder, unspecified: Secondary | ICD-10-CM

## 2017-04-03 DIAGNOSIS — Z23 Encounter for immunization: Secondary | ICD-10-CM

## 2017-04-03 DIAGNOSIS — S82142A Displaced bicondylar fracture of left tibia, initial encounter for closed fracture: Secondary | ICD-10-CM

## 2017-04-03 MED ORDER — ALPRAZOLAM 0.5 MG PO TABS: ORAL_TABLET | ORAL | 1 refills | Status: DC

## 2017-04-03 MED ORDER — OXYCODONE-ACETAMINOPHEN 5-325 MG PO TABS: 1.0000 | ORAL_TABLET | Freq: Every day | ORAL | 0 refills | Status: DC | PRN

## 2017-04-03 NOTE — Patient Instructions (Addendum)
MRI ordered.     Call ortho and schedule an appointment.    Continue percocet at night for pain.   Your prescription was sent to the pharmacy.    Flu immunization administered today.

## 2017-04-03 NOTE — Assessment & Plan Note (Addendum)
Concern for possible fracture per xray H/o fracture Will order MRI She will call ortho and schedule -  Stressed this needs to be done soon Percocet at night for pain Continue brace and crutches

## 2017-04-03 NOTE — Assessment & Plan Note (Signed)
Refill requested Anxiety controlled [place] controlled substance database checked.  Ok to fill medication. Refill sent to pharmacy

## 2017-04-21 ENCOUNTER — Ambulatory Visit
Admission: RE | Admit: 2017-04-21 | Discharge: 2017-04-21 | Disposition: A | Discharge status: Home / Self Care | Payer: Medicare Other | Source: Ambulatory Visit | Attending: Internal Medicine | Admitting: Internal Medicine

## 2017-04-21 DIAGNOSIS — M25562 Pain in left knee: Secondary | ICD-10-CM | POA: Diagnosis not present

## 2017-04-21 DIAGNOSIS — S82142A Displaced bicondylar fracture of left tibia, initial encounter for closed fracture: Secondary | ICD-10-CM

## 2017-04-23 ENCOUNTER — Other Ambulatory Visit: Payer: Self-pay | Admitting: Emergency Medicine

## 2017-04-23 ENCOUNTER — Telehealth: Payer: Self-pay | Admitting: Internal Medicine

## 2017-04-23 MED ORDER — OXYCODONE-ACETAMINOPHEN 5-325 MG PO TABS: 1.0000 | ORAL_TABLET | Freq: Every day | ORAL | 0 refills | Status: DC | PRN

## 2017-04-23 NOTE — Telephone Encounter (Signed)
Spoke with pt to give results. She has not been able to make appt with Ortho due to bad debt. I do not have an ortho to send report to.   Pt requested refill of oxy. [place] Controlled Substance Database checked. Last filled on 04/03/17 #7

## 2017-04-23 NOTE — Telephone Encounter (Signed)
Oxycodone refill. Last refill 04/03/17.

## 2017-04-23 NOTE — Telephone Encounter (Signed)
-----   Message from Binnie Rail, MD sent at 04/22/2017  9:08 PM EST ----- She should have an appt with ortho - send report to ortho.  Possible fracture, tear of a ligament in the knee

## 2017-04-23 NOTE — Telephone Encounter (Signed)
Copied from [place] [id number]. Topic: Quick Communication - Rx Refill/Question >> Apr 23, 2017  3:12 PM Lysle Morales L, NT wrote: Medication:  oxycodone  5- 325 20 tablets Has the patient contacted their pharmacy? no: (Agent: If no, request that the patient contact the pharmacy for the refill.) Preferred Pharmacy (with phone number or street name):[facility]  [place] Agent: Please be advised that RX refills may take up to 3 business days. We ask that you follow-up with your pharmacy. Patient says she was dispensed 7 pills on 04/03/17

## 2017-04-24 NOTE — Telephone Encounter (Signed)
Called pt no answer LMOM rx was refilled on yesterday. Pls check [facility] concerning refill.Marland KitchenJohny Chess

## 2017-04-24 NOTE — Telephone Encounter (Signed)
It was filled yesterday

## 2017-04-24 NOTE — Telephone Encounter (Signed)
Check [place] registry last filled 04/03/2017.Marland KitchenJohny Chess

## 2017-06-03 ENCOUNTER — Ambulatory Visit: Payer: Medicare Other | Admitting: Internal Medicine

## 2017-06-10 NOTE — Progress Notes (Signed)
Subjective:    Patient ID: [name], female    DOB: 14-Nov-1955, 62 y.o.   MRN: [id number]  HPI The patient is here for follow up.  Diabetes: She is taking her medication daily as prescribed. She is compliant with a diabetic diet. She is not exercising regularly. She checks her feet daily and denies foot lesions. She is not up-to-date with an ophthalmology examination.   Hypertension: She is taking her medication daily. She is compliant with a low sodium diet.  She denies chest pain, palpitations, edema, shortness of breath and regular headaches. She is not exercising regularly.  She does not monitor her blood pressure at home.    Anxiety: She is taking her medication daily as prescribed. She denies any side effects from the medication. She feels her anxiety is well controlled and she is happy with her current dose of medication.   Peripheral neuropathy:  She is taking gabapentin three times a day.  The medication is helping pain.    Medications and allergies reviewed with patient and updated if appropriate.  Patient Active Problem List   Diagnosis Date Noted  . B12 deficiency 06/15/2016  . Anxiety 06/12/2016  . Type 2 diabetes mellitus with diabetic autonomic neuropathy, without long-term current use of insulin ([place]) 01/10/2016  . Colon perforation ([place]) 01/10/2015  . Bursitis of right shoulder 01/19/2014  . Tibial plateau fracture, left 01/19/2014  . Multiple falls 01/19/2014  . Colostomy malfunction ([place]) 12/09/2012  . Peristomal hernia 04/19/2012  . Seborrheic dermatitis 08/09/2010  . PERSONAL HX RECTAL CANCER 06/05/2010  . SMOKER 04/16/2010  . ALLERGIC RHINITIS 04/16/2010  . Peripheral neuropathy ([place]) 12/06/2009  . Essential hypertension 12/06/2009  . Fatigue 12/06/2009  . Diarrhea 12/06/2009    Current Outpatient Medications on File Prior to Visit  Medication Sig Dispense Refill  . ALPRAZolam (XANAX) 0.5 MG tablet TAKE 1 TABLET BY MOUTH TWICE DAILY AS NEEDED  FOR ANXIETY OR SLEEP 60 tablet 1  . gabapentin (NEURONTIN) 600 MG tablet Take 2 tablets (1,200 mg total) by mouth 3 (three) times daily. 540 tablet 1  . hydrochlorothiazide (HYDRODIURIL) 25 MG tablet Take 1 tablet (25 mg total) by mouth daily. 90 tablet 1  . lisinopril (PRINIVIL,ZESTRIL) 10 MG tablet Take 1 tablet (10 mg total) by mouth daily. 90 tablet 3  . naproxen sodium (ANAPROX) 220 MG tablet Take 440 mg by mouth 2 (two) times daily as needed (pain).     No current facility-administered medications on file prior to visit.     Past Medical History:  Diagnosis Date  . Adenomatous colon polyp   . Anemia, unspecified   . Cocaine abuse ([place])    as recently as 10/21/11  . Depression   . Difficulty sleeping    takes meds to sleep  . Diverticulosis   . Hernia 03/18/2013  . History of transfusion   . Hypertension   . Incontinence of bowel    since colostomy  . Knee fracture, left   . 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 Nonspecific colitis 10/10/11  . Radiation proctitis    mild  . 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 APPENDECTOMY N/A 01/09/2015   Procedure: APPENDECTOMY;  Surgeon: Excell Seltzer, MD;  Location: [facility];  Service: General;  Laterality: N/A;  . COLOSTOMY  2013   [facility]  . FINGER FRACTURE SURGERY Left  and hand surgery  . HERNIA REPAIR    . LAPAROSCOPIC PARASTOMAL HERNIA N/A 05/12/2013   Procedure: LAPAROSCOPIC PARASTOMAL HERNIA;  Surgeon: Leighton Ruff, MD;  Location: [facility];  Service: General;  Laterality: N/A;  . LAPAROTOMY N/A 01/09/2015   Procedure: EXPLORATORY LAPAROTOMY;  Surgeon: Excell Seltzer, MD;  Location: [facility];  Service: General;  Laterality: N/A;  RESECTION OF COLOSTOMY, CREATION OF NEW COLOSTOMY, VENTRAL HERNIA REPAIR WITH BIOLOGIC MESH,  LYSIS OF ADHESIONS , OMENTECTOMY  . LOW ANTERIOR BOWEL RESECTION  03/07/09  . MOUTH SURGERY  10/2009  .  PORTACATH PLACEMENT     and removal    Social History   Socioeconomic History  . Marital status: Divorced    Spouse name: None  . Number of children: 0  . Years of education: None  . Highest education level: None  Social Needs  . Financial resource strain: None  . Food insecurity - worry: None  . Food insecurity - inability: None  . Transportation needs - medical: None  . Transportation needs - non-medical: None  Occupational History  . Occupation: disabiled  Tobacco Use  . Smoking status: Current Every Day Smoker    Packs/day: 0.25    Years: 40.00    Pack years: 10.00    Types: Cigarettes  . Smokeless tobacco: Never Used  Substance and Sexual Activity  . Alcohol use: Yes    Alcohol/week: 7.2 oz    Types: 12 Standard drinks or equivalent per week    Comment: occasional  driink  . Drug use: Yes    Types: Marijuana, Cocaine    Comment: no longer using Marijuana; last use of cocaine nov 2014   . Sexual activity: None  Other Topics Concern  . 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 Colon cancer Mother   . Diabetes Father   . Cirrhosis Brother   . Stomach cancer Sister   . Colon polyps Sister   . Pulmonary Hypertension Brother     Review of Systems  Constitutional: Negative for chills and fever.  Respiratory: Positive for cough (from URI). Negative for shortness of breath and wheezing.   Cardiovascular: Negative for chest pain, palpitations and leg swelling.  Neurological: Negative for light-headedness and headaches.       Objective:   Vitals:   06/11/17 1113  BP: 122/70  Pulse: 73  Resp: 16  Temp: 98.5 F (36.9 C)  SpO2: 98%   Wt Readings from Last 3 Encounters:  06/11/17 171 lb (77.6 kg)  03/25/17 165 lb (74.8 kg)  12/01/16 165 lb (74.8 kg)   Body mass index is  27.6 kg/m.   Physical Exam    Constitutional: Appears well-developed and well-nourished. No distress.  HENT:  Head: Normocephalic and atraumatic.  Neck: Neck supple. No tracheal deviation present. No thyromegaly present.  No cervical lymphadenopathy Cardiovascular: Normal rate, regular rhythm and normal heart sounds.   No murmur heard. No carotid bruit .  No edema Pulmonary/Chest: Effort normal and breath sounds normal. No respiratory distress. No has no wheezes. No rales.  Skin: Skin is warm and dry. Not diaphoretic.  Psychiatric: Normal mood and affect. Behavior is normal.      Assessment & Plan:    See Problem List for Assessment and Plan of chronic medical problems.

## 2017-06-10 NOTE — Patient Instructions (Addendum)
You need to have an eye exam for your diabetes.  Please have them send me a report.   Call and schedule your mammogram -  [facility] 7 a.m.-6:30 p.m., Monday 7 a.m.-5 p.m., Tuesday-Friday Schedule an appointment by calling [phone]   Test(s) ordered today. Your results will be released to [place] (or called to you) after review, usually within 72hours after test completion. If any changes need to be made, you will be notified at that same time.  All other Health Maintenance issues reviewed.   All recommended immunizations and age-appropriate screenings are up-to-date or discussed.  No immunizations administered today.   Medications reviewed and updated.   No changes recommended at this time.  Your prescription(s) have been submitted to your pharmacy. Please take as directed and contact our office if you believe you are having problem(s) with the medication(s).  Please followup in 6 months

## 2017-06-11 ENCOUNTER — Encounter: Payer: Self-pay | Admitting: Internal Medicine

## 2017-06-11 ENCOUNTER — Ambulatory Visit (INDEPENDENT_AMBULATORY_CARE_PROVIDER_SITE_OTHER): Payer: Medicare Other | Admitting: Internal Medicine

## 2017-06-11 VITALS — BP 122/70 | HR 73 | Temp 98.5°F | Resp 16 | Wt 171.0 lb

## 2017-06-11 DIAGNOSIS — G6289 Other specified polyneuropathies: Secondary | ICD-10-CM | POA: Diagnosis not present

## 2017-06-11 DIAGNOSIS — I10 Essential (primary) hypertension: Secondary | ICD-10-CM

## 2017-06-11 DIAGNOSIS — E1143 Type 2 diabetes mellitus with diabetic autonomic (poly)neuropathy: Secondary | ICD-10-CM

## 2017-06-11 DIAGNOSIS — F419 Anxiety disorder, unspecified: Secondary | ICD-10-CM

## 2017-06-11 MED ORDER — ALPRAZOLAM 0.5 MG PO TABS: ORAL_TABLET | ORAL | 1 refills | Status: DC

## 2017-06-11 NOTE — Assessment & Plan Note (Signed)
Check a1c Low sugar / carb diet Stressed regular exercise Get eye appt

## 2017-06-11 NOTE — Assessment & Plan Note (Signed)
Controlled, stable Continue current xanax dose - taking twice daily Refilled today

## 2017-06-11 NOTE — Assessment & Plan Note (Signed)
BP well controlled Current regimen effective and well tolerated Continue current medications at current doses  

## 2017-06-11 NOTE — Assessment & Plan Note (Signed)
Taking gabapentin - pain is controlled Continue at current dose

## 2017-06-17 DIAGNOSIS — K432 Incisional hernia without obstruction or gangrene: Secondary | ICD-10-CM | POA: Diagnosis not present

## 2017-07-15 ENCOUNTER — Other Ambulatory Visit: Payer: Self-pay | Admitting: Internal Medicine

## 2017-07-23 ENCOUNTER — Telehealth: Payer: Self-pay | Admitting: Emergency Medicine

## 2017-07-23 NOTE — Telephone Encounter (Signed)
Tried calling patient to schedule AWV. Number has been disconnected

## 2017-07-27 ENCOUNTER — Ambulatory Visit: Payer: Self-pay

## 2017-07-27 NOTE — Telephone Encounter (Signed)
Pt calling with c/o her peristomal skin looks gray, has pain and she sees polyps. She has tried using the powder and cream to allow wafer to adhere to skin but has never noted the gray color and polyps before. Pt stated that she is having intermittent pain strong enough to cause pt to yell out or double over. Pt wanting NT to call [facility] to get appointment with Wound Ostomy care Nurse. Called PCP [facility] and discussed pt situation. Advised by Gareth Eagle to refer pt to call her GI doctor. Provided pt with number of Dr Havery Moros for further evaluation.  Reason for Disposition . [1] New-onset pallor AND [2] mild change AND [3] present > 3 days  Answer Assessment - Initial Assessment Questions 1. COLOR of SKIN: "What best describes the color of the skin?" Pt describing peristomal area as a "greyish color" 2. CHANGES: "Is this a change?" If so, ask: "How much of a change?"     Yes- pt stated her peristomal skin looks gray and has polyps  3. PALLOR ELSEWHERE: "Has the color of the lips, gums or nailbeds also changed?   The peristomal area has changed to grey. Pt states that the color of her skin around the stoma has never looked gray before. 4. ONSET: When did the paleness begin?      5. CAUSE: What do you think is causing the pale skin?     Pt is worried that is an infection 6. BLEEDING: "Any recent blood loss?"     no 7. OTHER SYMPTOMS: "Do you have any other symptoms?" (e.g., weakness, dizziness, passing out, melena)     Pain at the area that makes her "holler" and doubles her over. Pain is intermittent.  8. PREGNANCY: "Is there any chance you are pregnant?" "When was your last menstrual period?"     n/a  Protocols used: PALE SKIN-A-AH

## 2017-07-27 NOTE — Telephone Encounter (Signed)
Confirm that she is contacting GI.

## 2017-07-28 ENCOUNTER — Telehealth: Payer: Self-pay | Admitting: Gastroenterology

## 2017-07-28 NOTE — Telephone Encounter (Signed)
LVM with pt to follow-up from call with Triage yesterday.

## 2017-07-28 NOTE — Telephone Encounter (Signed)
Spoke to patient, she states that her stoma is red, painful to the touch, describes skin area around it as "gray" in color. I have advised her to contact her surgeon who did the colostomy surgery immediately. Patient understands and will contact them.

## 2017-07-28 NOTE — Telephone Encounter (Signed)
Agree with your recommendations, she should be seen by her surgeon for this issue

## 2017-07-30 DIAGNOSIS — K9409 Other complications of colostomy: Secondary | ICD-10-CM | POA: Diagnosis not present

## 2017-08-20 IMAGING — DX DG CHEST 2V
2 series · 2 of 2 positions shown · non-contrast
Comparison: 09/09/2013

CLINICAL DATA: Productive cough for 3 weeks, hypertension,
community acquired pneumonia

EXAM:
CHEST  2 VIEW

[chest pa]
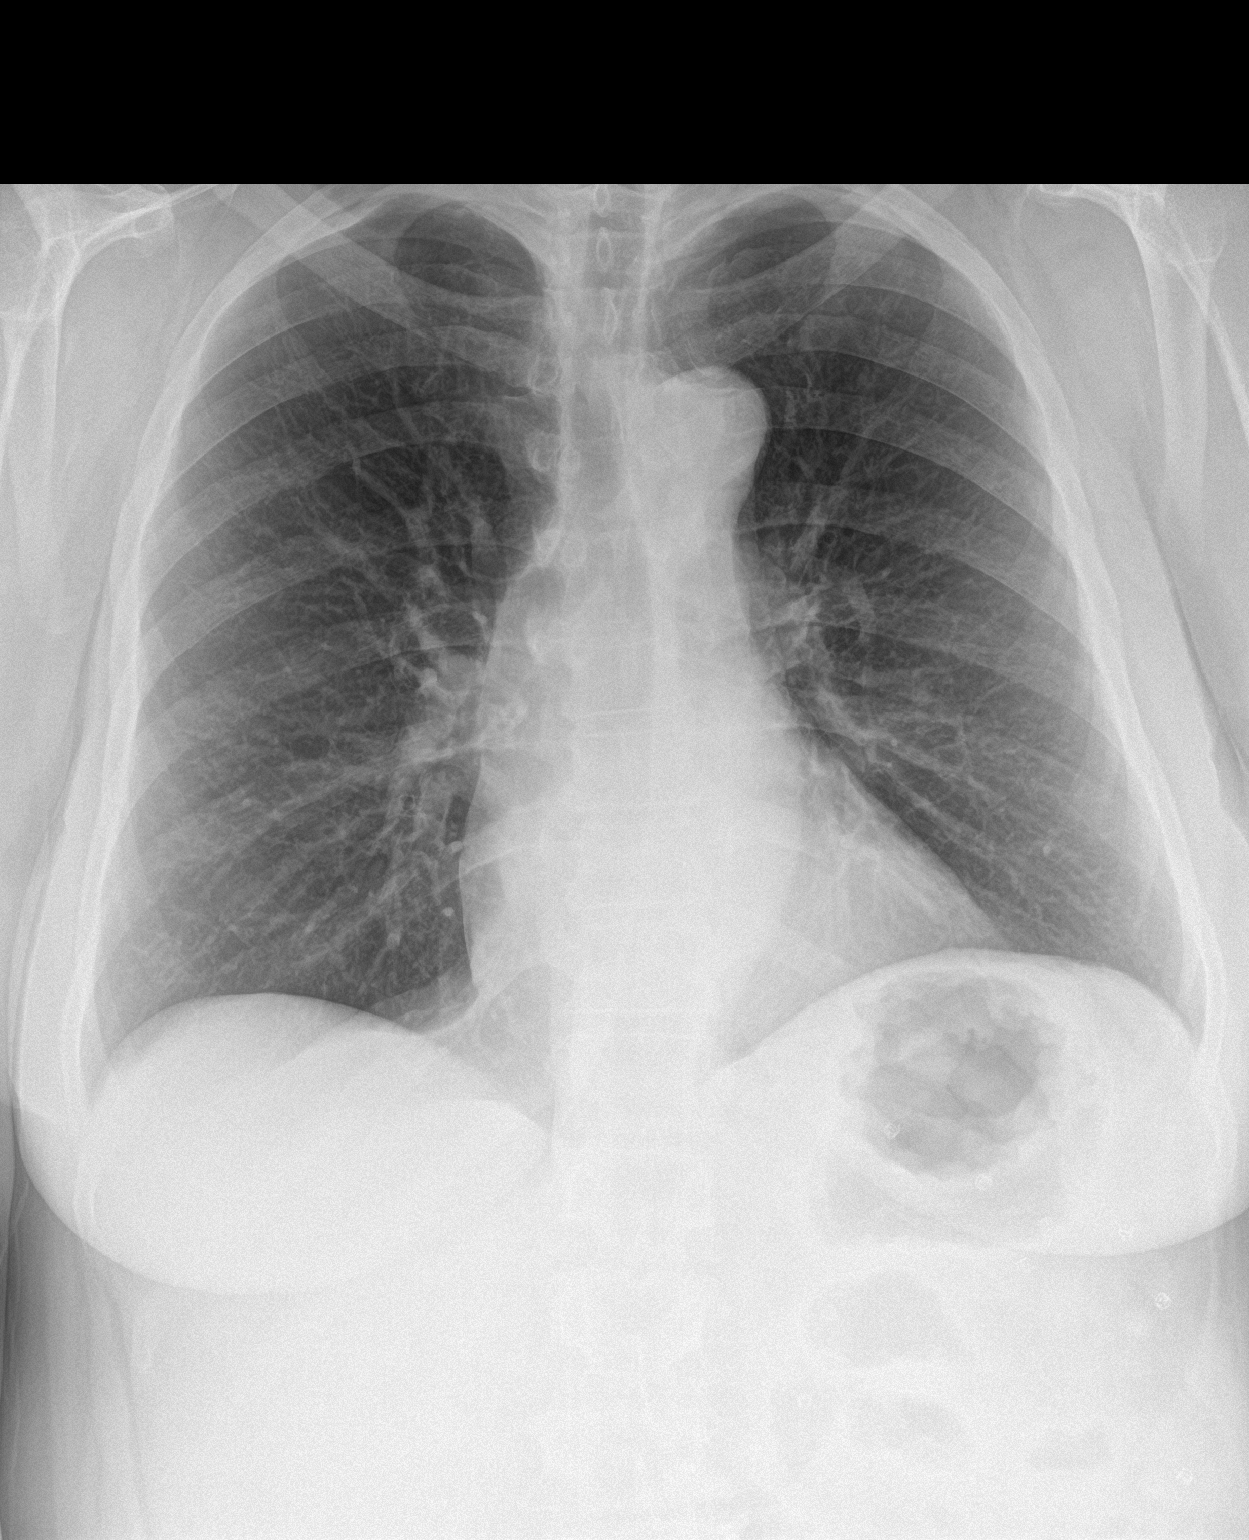

[chest lat]
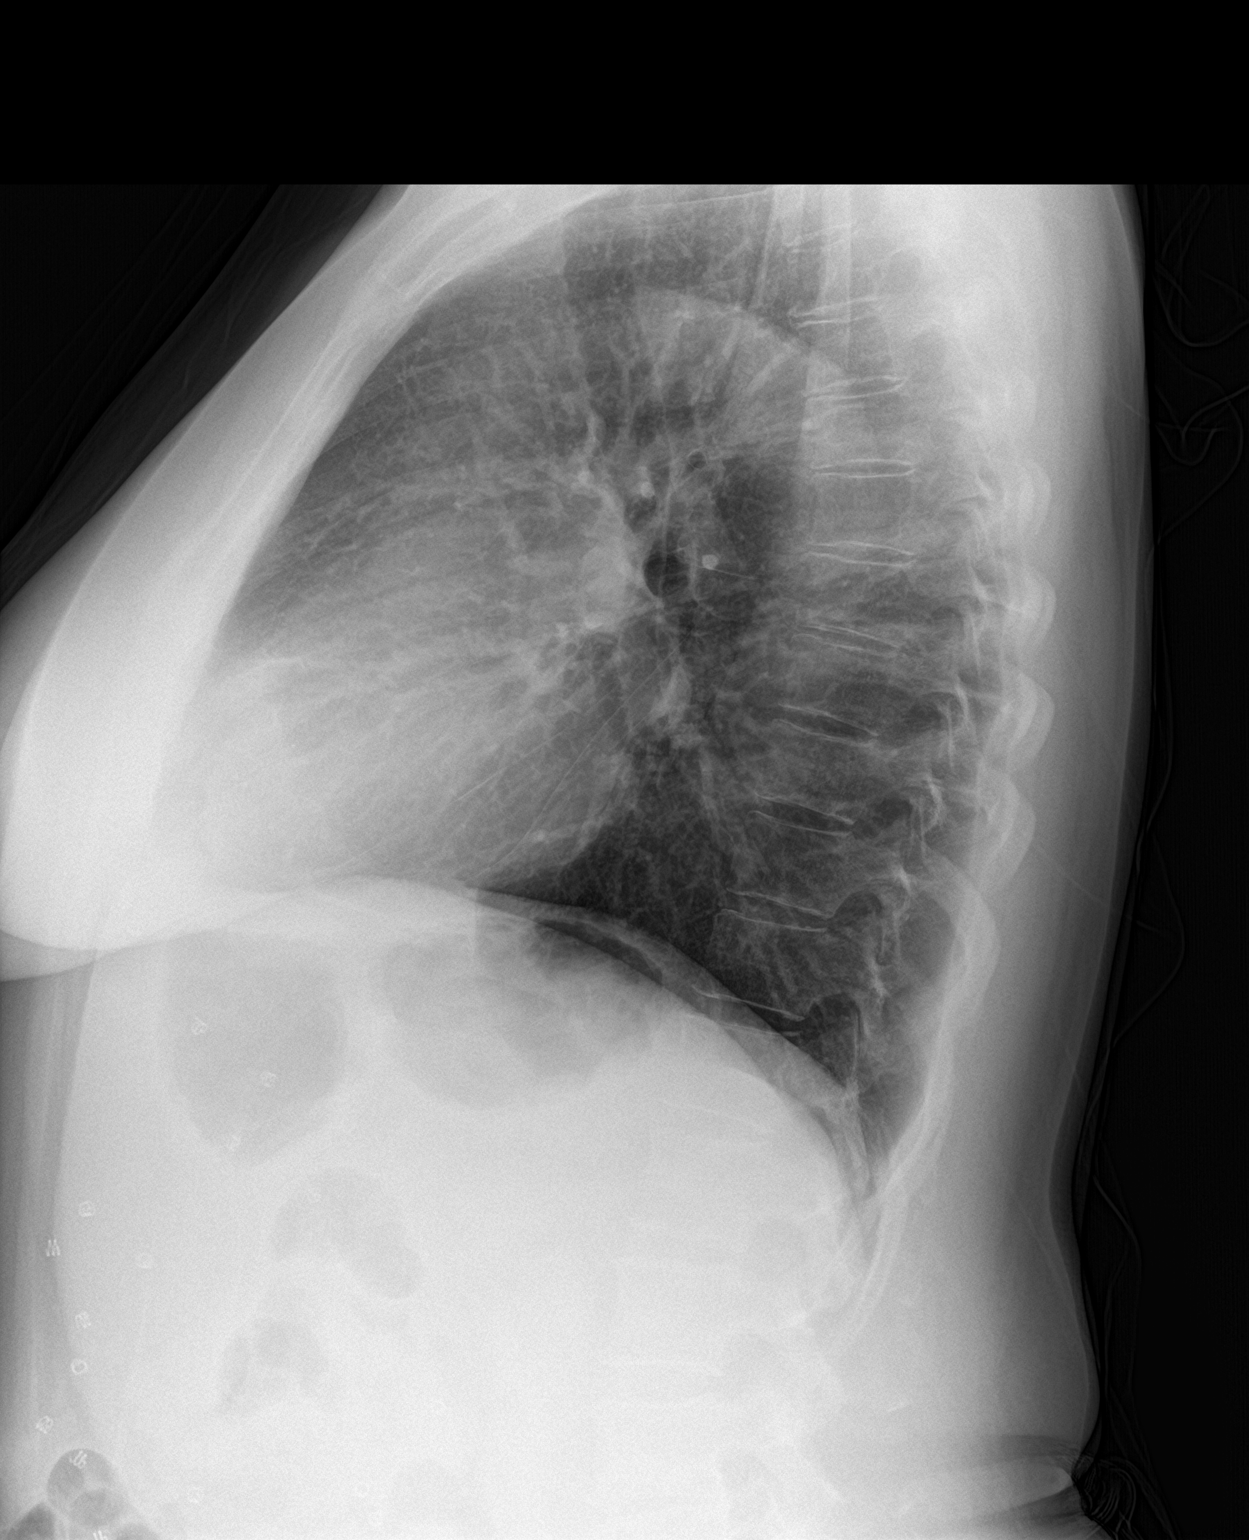

[2 of 2 positions shown; findings below may reference images not displayed]

FINDINGS: Normal heart size and pulmonary vascularity.

Tortuous thoracic aorta.

Lungs clear.

No pulmonary infiltrate, pleural effusion or pneumothorax.

Bones unremarkable.
IMPRESSION: No acute abnormalities.

## 2017-10-19 ENCOUNTER — Other Ambulatory Visit: Payer: Self-pay | Admitting: Internal Medicine

## 2017-12-08 NOTE — Patient Instructions (Addendum)
Continue to work on quitting smoking. Consider making an eye appointment and mammogram.   Test(s) ordered today. Your results will be released to [place] (or called to you) after review, usually within 72hours after test completion. If any changes need to be made, you will be notified at that same time.  Flu immunization administered today.   Medications reviewed and updated.  No changes recommended at this time.  Your prescription(s) have been given to you. Please take as directed and contact our office if you believe you are having problem(s) with the medication(s).   Please followup in 6 months

## 2017-12-08 NOTE — Progress Notes (Signed)
Subjective:    Patient ID: [name], female    DOB: Jan 27, 1956, 62 y.o.   MRN: [id number]  HPI The patient is here for follow up.  Diabetes: She is taking her medication daily as prescribed. She is compliant with a diabetic diet. She is exercising regularly - walking, riding bike.  She has peripheral neuropathy and checks her feet daily and denies foot lesions. She is not up-to-date with an ophthalmology examination.   Hypertension: She is taking her medication daily. She is compliant with a low sodium diet.  She denies chest pain, palpitations, edema, shortness of breath and regular headaches. She is exercising regularly.      Anxiety: She is taking her xanax daily most days. She will go through periods of not taking it.  She denies any side effects from the medication. She feels her anxiety is well controlled and she is happy with her current dose of medication.   Peripheral neuropathy:  She takes gabapentin three times a day.  The medication does help.    Tobacco abuse:  She is down to < 1 pack per month.  She is using the quit now program.     She wants to get her hernia fixed but needs to stop smoking first.   Medications and allergies reviewed with patient and updated if appropriate.  Patient Active Problem List   Diagnosis Date Noted  . B12 deficiency 06/15/2016  . Anxiety 06/12/2016  . Type 2 diabetes mellitus with diabetic autonomic neuropathy, without long-term current use of insulin ([place]) 01/10/2016  . Colon perforation ([place]) 01/10/2015  . Bursitis of right shoulder 01/19/2014  . Tibial plateau fracture, left 01/19/2014  . Multiple falls 01/19/2014  . Colostomy malfunction ([place]) 12/09/2012  . Peristomal hernia 04/19/2012  . Seborrheic dermatitis 08/09/2010  . PERSONAL HX RECTAL CANCER 06/05/2010  . SMOKER 04/16/2010  . ALLERGIC RHINITIS 04/16/2010  . Peripheral neuropathy ([place]) 12/06/2009  . Essential hypertension 12/06/2009  . Fatigue 12/06/2009  .  Diarrhea 12/06/2009    Current Outpatient Medications on File Prior to Visit  Medication Sig Dispense Refill  . ALPRAZolam (XANAX) 0.5 MG tablet TAKE 1 TABLET BY MOUTH TWICE DAILY AS NEEDED FOR ANXIETY OR SLEEP 60 tablet 1  . gabapentin (NEURONTIN) 600 MG tablet Take 2 tablets (1,200 mg total) by mouth 3 (three) times daily. 540 tablet 1  . hydrochlorothiazide (HYDRODIURIL) 25 MG tablet TAKE 1 TABLET BY MOUTH ONCE DAILY 90 tablet 1  . lisinopril (PRINIVIL,ZESTRIL) 10 MG tablet Take 1 tablet (10 mg total) by mouth daily. 90 tablet 3  . naproxen sodium (ANAPROX) 220 MG tablet Take 440 mg by mouth 2 (two) times daily as needed (pain).     No current facility-administered medications on file prior to visit.     Past Medical History:  Diagnosis Date  . Adenomatous colon polyp   . Anemia, unspecified   . Cocaine abuse ([place])    as recently as 10/21/11  . Depression   . Difficulty sleeping    takes meds to sleep  . Diverticulosis   . Hernia 03/18/2013  . History of transfusion   . Hypertension   . Incontinence of bowel    since colostomy  . Knee fracture, left   . 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 Nonspecific colitis 10/10/11  . Radiation proctitis    mild  . 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 APPENDECTOMY N/A 01/09/2015   Procedure: APPENDECTOMY;  Surgeon: Excell Seltzer, MD;  [facility];  Service: General;  Laterality: N/A;  . COLOSTOMY  2013   [facility]  . FINGER FRACTURE SURGERY Left    and hand surgery  . HERNIA REPAIR    . LAPAROSCOPIC PARASTOMAL HERNIA N/A 05/12/2013   Procedure: LAPAROSCOPIC PARASTOMAL HERNIA;  Surgeon: Leighton Ruff, MD;  [facility];  Service: General;  Laterality: N/A;  . LAPAROTOMY N/A 01/09/2015   Procedure: EXPLORATORY LAPAROTOMY;  Surgeon: Excell Seltzer, MD;  [facility];  Service: General;  Laterality: N/A;  RESECTION OF  COLOSTOMY, CREATION OF NEW COLOSTOMY, VENTRAL HERNIA REPAIR WITH BIOLOGIC MESH,  LYSIS OF ADHESIONS , OMENTECTOMY  . LOW ANTERIOR BOWEL RESECTION  03/07/09  . MOUTH SURGERY  10/2009  . PORTACATH PLACEMENT     and removal    Social History   Socioeconomic History  . Marital status: Divorced    Spouse name: Not on file  . Number of children: 0  . Years of education: Not on file  . Highest education level: Not on file  Occupational History  . Occupation: disabiled  Social Needs  . Financial resource strain: Not on file  . Food insecurity:    Worry: Not on file    Inability: Not on file  . Transportation needs:    Medical: Not on file    Non-medical: Not on file  Tobacco Use  . Smoking status: Current Every Day Smoker    Packs/day: 0.25    Years: 40.00    Pack years: 10.00    Types: Cigarettes  . Smokeless tobacco: Never Used  Substance and Sexual Activity  . Alcohol use: Yes    Alcohol/week: 12.0 standard drinks    Types: 12 Standard drinks or equivalent per week    Comment: occasional  driink  . Drug use: Yes    Types: Marijuana, Cocaine    Comment: no longer using Marijuana; last use of cocaine nov 2014   . Sexual activity: Not on file  Lifestyle  . Physical activity:    Days per week: Not on file    Minutes per session: Not on file  . Stress: Not on file  Relationships  . Social connections:    Talks on phone: Not on file    Gets together: Not on file    Attends religious service: Not on file    Active member of club or organization: Not on file    Attends meetings of clubs or organizations: Not on file    Relationship status: Not on file  Other Topics Concern  . 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 Colon cancer Mother   . Diabetes Father   . Cirrhosis Brother     . Stomach cancer Sister   . Colon polyps Sister   . Pulmonary Hypertension Brother     Review of Systems  Constitutional: Negative for chills and fever.  Respiratory: Negative for cough, shortness of breath and wheezing.   Cardiovascular: Negative for chest pain, palpitations and leg swelling.  Neurological: Negative for light-headedness and headaches.       Objective:   Vitals:   12/09/17 0738  BP: 120/74  Pulse: 69  Resp: 16  Temp: 97.9  F (36.6 C)  SpO2: 96%   BP Readings from Last 3 Encounters:  12/09/17 120/74  06/11/17 122/70  04/03/17 124/70   Wt Readings from Last 3 Encounters:  12/09/17 167 lb 12.8 oz (76.1 kg)  06/11/17 171 lb (77.6 kg)  03/25/17 165 lb (74.8 kg)   Body mass index is 27.08 kg/m.   Physical Exam    Constitutional: Appears well-developed and well-nourished. No distress.  HENT:  Head: Normocephalic and atraumatic.  Neck: Neck supple. No tracheal deviation present. No thyromegaly present.  No cervical lymphadenopathy Cardiovascular: Normal rate, regular rhythm and normal heart sounds.   No murmur heard. No carotid bruit .  No edema Pulmonary/Chest: Effort normal and breath sounds normal. No respiratory distress. No has no wheezes. No rales.  Skin: Skin is warm and dry. Not diaphoretic.  Psychiatric: Normal mood and affect. Behavior is normal.    Diabetic Foot Exam - Simple   Simple Foot Form Diabetic Foot exam was performed with the following findings:  Yes 12/09/2017  8:10 AM  Visual Inspection No deformities, no ulcerations, no other skin breakdown bilaterally:  Yes Sensation Testing See comments:  Yes Pulse Check Posterior Tibialis and Dorsalis pulse intact bilaterally:  Yes Comments Significantly decreased sensation b/l feet      Assessment & Plan:    See Problem List for Assessment and Plan of chronic medical problems.

## 2017-12-09 ENCOUNTER — Ambulatory Visit (INDEPENDENT_AMBULATORY_CARE_PROVIDER_SITE_OTHER): Payer: Medicare Other | Admitting: Internal Medicine

## 2017-12-09 ENCOUNTER — Encounter: Payer: Self-pay | Admitting: Internal Medicine

## 2017-12-09 ENCOUNTER — Other Ambulatory Visit (INDEPENDENT_AMBULATORY_CARE_PROVIDER_SITE_OTHER): Payer: Medicare Other

## 2017-12-09 VITALS — BP 120/74 | HR 69 | Temp 97.9°F | Resp 16 | Ht 66.0 in | Wt 167.8 lb

## 2017-12-09 DIAGNOSIS — F17200 Nicotine dependence, unspecified, uncomplicated: Secondary | ICD-10-CM

## 2017-12-09 DIAGNOSIS — Z23 Encounter for immunization: Secondary | ICD-10-CM

## 2017-12-09 DIAGNOSIS — F419 Anxiety disorder, unspecified: Secondary | ICD-10-CM | POA: Diagnosis not present

## 2017-12-09 DIAGNOSIS — I10 Essential (primary) hypertension: Secondary | ICD-10-CM

## 2017-12-09 DIAGNOSIS — E1143 Type 2 diabetes mellitus with diabetic autonomic (poly)neuropathy: Secondary | ICD-10-CM

## 2017-12-09 DIAGNOSIS — G6289 Other specified polyneuropathies: Secondary | ICD-10-CM

## 2017-12-09 LAB — COMPREHENSIVE METABOLIC PANEL
ALT: 23 U/L (ref 0–35)
AST: 21 U/L (ref 0–37)
Albumin: 4.1 g/dL (ref 3.5–5.2)
Alkaline Phosphatase: 103 U/L (ref 39–117)
BUN: 25 mg/dL — ABNORMAL HIGH (ref 6–23)
CO2: 21 mEq/L (ref 19–32)
Calcium: 9.6 mg/dL (ref 8.4–10.5)
Chloride: 105 mEq/L (ref 96–112)
Creatinine, Ser: 0.89 mg/dL (ref 0.40–1.20)
GFR: 82.79 mL/min (ref >60.00)
Glucose, Bld: 136 mg/dL — ABNORMAL HIGH (ref 70–99)
Potassium: 4 mEq/L (ref 3.5–5.1)
Sodium: 137 mEq/L (ref 135–145)
Total Bilirubin: 0.7 mg/dL (ref 0.2–1.2)
Total Protein: 7 g/dL (ref 6.0–8.3)

## 2017-12-09 LAB — LIPID PANEL
Cholesterol: 180 mg/dL (ref 0–200)
HDL: 47 mg/dL (ref >39.00)
Total CHOL/HDL Ratio: 4
Triglycerides: 725 mg/dL — ABNORMAL HIGH (ref 0.0–149.0)

## 2017-12-09 LAB — LDL CHOLESTEROL, DIRECT: Direct LDL: 69 mg/dL

## 2017-12-09 LAB — HEMOGLOBIN A1C: Hgb A1c MFr Bld: 6.8 % — ABNORMAL HIGH (ref 4.6–6.5)

## 2017-12-09 MED ORDER — ALPRAZOLAM 0.5 MG PO TABS: ORAL_TABLET | ORAL | 1 refills | Status: DC

## 2017-12-09 MED ORDER — GABAPENTIN 600 MG PO TABS: 1200.0000 mg | ORAL_TABLET | Freq: Three times a day (TID) | ORAL | 1 refills | Status: DC

## 2017-12-09 NOTE — Assessment & Plan Note (Signed)
Controlled, stable Continue current dose of medication  

## 2017-12-09 NOTE — Assessment & Plan Note (Signed)
BP well controlled Current regimen effective and well tolerated Continue current medications at current doses cmp  

## 2017-12-09 NOTE — Assessment & Plan Note (Signed)
Diet controlled Continue diabetic diet and regular exercise Exam done and discussed the need to check her feet on a daily basis because of her peripheral neuropathy A1c today Follow-up in 6 months

## 2017-12-09 NOTE — Assessment & Plan Note (Signed)
She is decreasing the amount that she smokes slowly and her plan is to stop smoking so that she can have her abdominal hernia repaired

## 2017-12-09 NOTE — Assessment & Plan Note (Signed)
Taking gabapentin 1200 mg 3 times daily Overall controlled She checks her feet daily

## 2017-12-11 ENCOUNTER — Other Ambulatory Visit: Payer: Self-pay | Admitting: Internal Medicine

## 2017-12-11 MED ORDER — FENOFIBRATE 48 MG PO TABS
48.0000 mg | ORAL_TABLET | Freq: Every day | ORAL | 5 refills | Status: DC
Start: 2017-12-11 — End: 2018-06-15

## 2017-12-16 ENCOUNTER — Other Ambulatory Visit: Payer: Self-pay | Admitting: Internal Medicine

## 2018-01-22 ENCOUNTER — Telehealth: Payer: Self-pay | Admitting: Internal Medicine

## 2018-01-22 NOTE — Telephone Encounter (Signed)
Copied from [place] [id number]. Topic: Quick Communication - See Telephone Encounter >> Jan 22, 2018  4:01 PM Bea Graff, NT wrote: CRM for notification. See Telephone encounter for: 01/22/18. Pt states that she contacted her ostomy nurses regarding the wafers for her ostomy bag will not stay on due to her having very "wet" stools. The ostomy nurse told her to reach out to her PCP to see if she can recommend something to help slow her stools down. Please advise.

## 2018-01-25 NOTE — Telephone Encounter (Signed)
Has she been eating foods that typically thicken the stool like apple sauce, bananas, yogurt, rice and oatmeal.  She can take an imodium if needed or one pill daily if needed - she just does not want to overdo the medication

## 2018-01-26 NOTE — Telephone Encounter (Signed)
LVM for pt to call back.

## 2018-01-28 NOTE — Telephone Encounter (Signed)
Pt aware.

## 2018-03-15 ENCOUNTER — Ambulatory Visit: Payer: Self-pay

## 2018-03-15 NOTE — Telephone Encounter (Signed)
Pt c/o productive cough and chest pressure across the left side that began 2 weeks ago. Pt stated that her cough is "wet" or congested. Pt stated that she occasionally vomits after coughing spells. Pt denies coughing up blood. She is coughing up white thick and foamy phlegm. Pt denies fever, shortness of breath, wheezing. Pt stated that she is also having chest pressure that started 2 weeks ago along with cough. Pt denies any radiation to arm, back, neck or jaw. Pt stated she has a runny nose. Pt's chest pain across the center of chest to the left side. Pt stated that the pain is constant and present when her cough wakes her up at night. Pt is a smoker, has hypertension, is prediabetic and has high cholesterol. Pt thinks that she chest pressure is from the cough. Ardeen Garland at PCP office and gave her a brief synopsis about the triage call. Asked if an office appointment should be scheduled or to send her to [facility] or ED. Per Gareth Eagle an office visit is okay. Care advice given to pt and pt verbalized understanding. Pt offered an appointment, but pt stated that she could not come in to office until Wednesday. Pt given an appointment 03/17/18 at 8:45 am.  Reason for Disposition . SEVERE coughing spells (e.g., whooping sound after coughing, vomiting after coughing)  Answer Assessment - Initial Assessment Questions 1. LOCATION: "Where does it hurt?"       Across center from the left side 2. RADIATION: "Does the pain go anywhere else?" (e.g., into neck, jaw, arms, back)     no 3. ONSET: "When did the chest pain begin?" (Minutes, hours or days)      2 weeks ago 4. PATTERN "Does the pain come and go, or has it been constant since it started?"  "Does it get worse with exertion?"      Constant- more noticeable when still and at night wakes up cough 5. DURATION: "How long does it last" (e.g., seconds, minutes, hours)     2 weeks  6. SEVERITY: "How bad is the pain?"  (e.g., Scale 1-10; mild, moderate, or severe)    - MILD (1-3): doesn't interfere with normal activities     - MODERATE (4-7): interferes with normal activities or awakens from sleep    - SEVERE (8-10): excruciating pain, unable to do any normal activities       moderate 7. CARDIAC RISK FACTORS: "Do you have any history of heart problems or risk factors for heart disease?" (e.g., prior heart attack, angina; high blood pressure, diabetes, being overweight, high cholesterol, smoking, or strong family history of heart disease)     Smoker, HTN, pre diabetic, high cholesterol 8. PULMONARY RISK FACTORS: "Do you have any history of lung disease?"  (e.g., blood clots in lung, asthma, emphysema, birth control pills)     no 9. CAUSE: "What do you think is causing the chest pain?"     From the cough 10. OTHER SYMPTOMS: "Do you have any other symptoms?" (e.g., dizziness, nausea, vomiting, sweating, fever, difficulty breathing, cough)       Productive cough, states she has hot sweats 11. PREGNANCY: "Is there any chance you are pregnant?" "When was your last menstrual period?"       n/a  Answer Assessment - Initial Assessment Questions 1. ONSET: "When did the cough begin?"      2 weeks  2. SEVERITY: "How bad is the cough today?"      Congested "wet" frequent cough 3. RESPIRATORY  DISTRESS: "Describe your breathing."      No SOB 4. FEVER: "Do you have a fever?" If so, ask: "What is your temperature, how was it measured, and when did it start?"     no 5. SPUTUM: "Describe the color of your sputum" (clear, white, yellow, green)     White thick and foamy 6. HEMOPTYSIS: "Are you coughing up any blood?" If so ask: "How much?" (flecks, streaks, tablespoons, etc.)     no 7. CARDIAC HISTORY: "Do you have any history of heart disease?" (e.g., heart attack, congestive heart failure)      Hypertension 8. LUNG HISTORY: "Do you have any history of lung disease?"  (e.g., pulmonary embolus, asthma, emphysema)     no 9. PE RISK FACTORS: "Do you have a history of  blood clots?" (or: recent major surgery, recent prolonged travel, bedridden)     no 10. OTHER SYMPTOMS: "Do you have any other symptoms?" (e.g., runny nose, wheezing, chest pain)       Runny nose, chest "pressure" 11. PREGNANCY: "Is there any chance you are pregnant?" "When was your last menstrual period?"       n/a 12. TRAVEL: "Have you traveled out of the country in the last month?" (e.g., travel history, exposures)       no  Protocols used: COUGH - ACUTE PRODUCTIVE-A-AH, CHEST PAIN-A-AH

## 2018-03-16 NOTE — Progress Notes (Signed)
Subjective:    Patient ID: [name], female    DOB: [birth year], 62 y.o.   MRN: [id number]  HPI She is here for an acute visit for cold symptoms.  Her symptoms started 2 weeks ago.  She is a smoking.    She is experiencing productive cough with gray-green sputum and chest pressure which has improved. She states decreased appetite, nasal congestion, runny nose, nausea, body aches and mild headaches.  She denies fever, chills, SOB and wheeze  She has taken mucinex  Medications and allergies reviewed with patient and updated if appropriate.  Patient Active Problem List   Diagnosis Date Noted  . B12 deficiency 06/15/2016  . Anxiety 06/12/2016  . Type 2 diabetes mellitus with diabetic autonomic neuropathy, without long-term current use of insulin ([place]) 01/10/2016  . Colon perforation ([place]) 01/10/2015  . Multiple falls 01/19/2014  . Colostomy malfunction ([place]) 12/09/2012  . Peristomal hernia 04/19/2012  . Seborrheic dermatitis 08/09/2010  . PERSONAL HX RECTAL CANCER 06/05/2010  . SMOKER 04/16/2010  . ALLERGIC RHINITIS 04/16/2010  . Peripheral neuropathy ([place]) 12/06/2009  . Essential hypertension 12/06/2009  . Fatigue 12/06/2009  . Diarrhea 12/06/2009    Current Outpatient Medications on File Prior to Visit  Medication Sig Dispense Refill  . ALPRAZolam (XANAX) 0.5 MG tablet Take 1 tablet by mouth twice daily as needed for anxiety or sleep. 60 tablet 1  . fenofibrate (TRICOR) 48 MG tablet Take 1 tablet (48 mg total) by mouth daily. 30 tablet 5  . gabapentin (NEURONTIN) 600 MG tablet Take 2 tablets (1,200 mg total) by mouth 3 (three) times daily. 540 tablet 1  . hydrochlorothiazide (HYDRODIURIL) 25 MG tablet TAKE 1 TABLET BY MOUTH ONCE DAILY 90 tablet 1  . lisinopril (PRINIVIL,ZESTRIL) 10 MG tablet Take 1 tablet (10 mg total) by mouth daily. 90 tablet 3  . naproxen sodium (ANAPROX) 220 MG tablet Take 440 mg by mouth 2 (two) times daily as needed (pain).     No current  facility-administered medications on file prior to visit.     Past Medical History:  Diagnosis Date  . Adenomatous colon polyp   . Anemia, unspecified   . Cocaine abuse ([place])    as recently as 10/21/11  . Depression   . Difficulty sleeping    takes meds to sleep  . Diverticulosis   . Hernia 03/18/2013  . History of transfusion   . Hypertension   . Incontinence of bowel    since colostomy  . Knee fracture, left   . 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 Nonspecific colitis 10/10/11  . Radiation proctitis    mild  . 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 Tibial plateau fracture, left 01/19/2014    Past Surgical History:  Procedure Laterality Date  . APPENDECTOMY N/A 01/09/2015   Procedure: APPENDECTOMY;  Surgeon: Excell Seltzer, MD;  Location: [facility];  Service: General;  Laterality: N/A;  . COLOSTOMY  2013   [facility]  . FINGER FRACTURE SURGERY Left    and hand surgery  . HERNIA REPAIR    . LAPAROSCOPIC PARASTOMAL HERNIA N/A 05/12/2013   Procedure: LAPAROSCOPIC PARASTOMAL HERNIA;  Surgeon: Leighton Ruff, MD;  Location: [facility];  Service: General;  Laterality: N/A;  . LAPAROTOMY N/A 01/09/2015   Procedure: EXPLORATORY LAPAROTOMY;  Surgeon: Excell Seltzer, MD;  Location: [facility];  Service: General;  Laterality: N/A;  RESECTION OF COLOSTOMY, CREATION OF NEW COLOSTOMY, VENTRAL HERNIA  REPAIR WITH BIOLOGIC MESH,  LYSIS OF ADHESIONS , OMENTECTOMY  . LOW ANTERIOR BOWEL RESECTION  03/07/09  . MOUTH SURGERY  10/2009  . PORTACATH PLACEMENT     and removal    Social History   Socioeconomic History  . Marital status: Divorced    Spouse name: Not on file  . Number of children: 0  . Years of education: Not on file  . Highest education level: Not on file  Occupational History  . Occupation: disabiled  Social Needs  . Financial resource strain: Not on file  . Food insecurity:    Worry: Not on file    Inability: Not on file  . Transportation  needs:    Medical: Not on file    Non-medical: Not on file  Tobacco Use  . Smoking status: Current Every Day Smoker    Packs/day: 0.25    Years: 40.00    Pack years: 10.00    Types: Cigarettes  . Smokeless tobacco: Never Used  Substance and Sexual Activity  . Alcohol use: Yes    Alcohol/week: 12.0 standard drinks    Types: 12 Standard drinks or equivalent per week    Comment: occasional  driink  . Drug use: Yes    Types: Marijuana, Cocaine    Comment: no longer using Marijuana; last use of cocaine nov 2014   . Sexual activity: Not on file  Lifestyle  . Physical activity:    Days per week: Not on file    Minutes per session: Not on file  . Stress: Not on file  Relationships  . Social connections:    Talks on phone: Not on file    Gets together: Not on file    Attends religious service: Not on file    Active member of club or organization: Not on file    Attends meetings of clubs or organizations: Not on file    Relationship status: Not on file  Other Topics Concern  . 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 Colon cancer Mother   . Diabetes Father   . Cirrhosis Brother   . Stomach cancer Sister   . Colon polyps Sister   . Pulmonary Hypertension Brother     Review of Systems  Constitutional: Positive for appetite change (dec). Negative for chills and fever.  HENT: Positive for congestion and rhinorrhea. Negative for ear pain, sinus pressure, sinus pain and sore throat.   Respiratory: Positive for cough (productive). Negative for chest tightness, shortness of breath and wheezing.   Gastrointestinal: Positive for nausea (mild).  Musculoskeletal: Positive for myalgias.  Neurological: Positive for headaches (mild).       Objective:   Vitals:   03/17/18 0848  BP: (!) 148/80    Pulse: 63  Resp: 18  Temp: 98.1 F (36.7 C)  SpO2: 98%   Filed Weights   03/17/18 0848  Weight: 165 lb (74.8 kg)   Body mass index is 26.63 kg/m.  Wt Readings from Last 3 Encounters:  03/17/18 165 lb (74.8 kg)  12/09/17 167 lb 12.8 oz (76.1 kg)  06/11/17 171 lb (77.6 kg)     Physical Exam GENERAL APPEARANCE: Appears stated age, well appearing, NAD EYES: conjunctiva clear, no icterus HEENT: bilateral tympanic membranes and ear canals normal, oropharynx with mild erythema,  no thyromegaly, trachea midline, no cervical or supraclavicular lymphadenopathy LUNGS:  unlabored breathing, good air entry bilaterally, coarse BS, no wheeze or crackles CARDIOVASCULAR: Normal S1,S2 without murmurs, no edema SKIN: warm, dry        Assessment & Plan:   See Problem List for Assessment and Plan of chronic medical problems.

## 2018-03-17 ENCOUNTER — Ambulatory Visit (INDEPENDENT_AMBULATORY_CARE_PROVIDER_SITE_OTHER): Payer: Medicare Other | Admitting: Internal Medicine

## 2018-03-17 ENCOUNTER — Encounter: Payer: Self-pay | Admitting: Internal Medicine

## 2018-03-17 VITALS — BP 148/80 | HR 63 | Temp 98.1°F | Resp 18 | Ht 66.0 in | Wt 165.0 lb

## 2018-03-17 DIAGNOSIS — J209 Acute bronchitis, unspecified: Secondary | ICD-10-CM | POA: Diagnosis not present

## 2018-03-17 MED ORDER — AZITHROMYCIN 250 MG PO TABS: ORAL_TABLET | ORAL | 0 refills | Status: DC

## 2018-03-17 MED ORDER — ALPRAZOLAM 0.5 MG PO TABS: ORAL_TABLET | ORAL | 1 refills | Status: DC

## 2018-03-17 NOTE — Assessment & Plan Note (Signed)
Likely bacterial  Start zpak Stressed smoking cessation otc cold medications Rest, fluid Call if no improvement

## 2018-03-17 NOTE — Patient Instructions (Signed)
Take the antibiotic as prescribed - complete the entire course.     Continue over the counter cold medication, advil and tylenol.  Increase your fluids and rest.    Call if no improvement       Acute Bronchitis, Adult Acute bronchitis is sudden (acute) swelling of the air tubes (bronchi) in the lungs. Acute bronchitis causes these tubes to fill with mucus, which can make it hard to breathe. It can also cause coughing or wheezing. In adults, acute bronchitis usually goes away within 2 weeks. A cough caused by bronchitis may last up to 3 weeks. Smoking, allergies, and asthma can make the condition worse. Repeated episodes of bronchitis may cause further lung problems, such as chronic obstructive pulmonary disease (COPD). What are the causes? This condition can be caused by germs and by substances that irritate the lungs, including:  Cold and flu viruses. This condition is most often caused by the same virus that causes a cold.  Bacteria.  Exposure to tobacco smoke, dust, fumes, and air pollution.  What increases the risk? This condition is more likely to develop in people who:  Have close contact with someone with acute bronchitis.  Are exposed to lung irritants, such as tobacco smoke, dust, fumes, and vapors.  Have a weak immune system.  Have a respiratory condition such as asthma.  What are the signs or symptoms? Symptoms of this condition include:  A cough.  Coughing up clear, yellow, or green mucus.  Wheezing.  Chest congestion.  Shortness of breath.  A fever.  Body aches.  Chills.  A sore throat.  How is this diagnosed? This condition is usually diagnosed with a physical exam. During the exam, your health care provider may order tests, such as chest X-rays, to rule out other conditions. He or she may also:  Test a sample of your mucus for bacterial infection.  Check the level of oxygen in your blood. This is done to check for pneumonia.  Do a chest  X-ray or lung function testing to rule out pneumonia and other conditions.  Perform blood tests.  Your health care provider will also ask about your symptoms and medical history. How is this treated? Most cases of acute bronchitis clear up over time without treatment. Your health care provider may recommend:  Drinking more fluids. Drinking more makes your mucus thinner, which may make it easier to breathe.  Taking a medicine for a fever or cough.  Taking an antibiotic medicine.  Using an inhaler to help improve shortness of breath and to control a cough.  Using a cool mist vaporizer or humidifier to make it easier to breathe.  Follow these instructions at home: Medicines  Take over-the-counter and prescription medicines only as told by your health care provider.  If you were prescribed an antibiotic, take it as told by your health care provider. Do not stop taking the antibiotic even if you start to feel better. General instructions  Get plenty of rest.  Drink enough fluids to keep your urine clear or pale yellow.  Avoid smoking and secondhand smoke. Exposure to cigarette smoke or irritating chemicals will make bronchitis worse. If you smoke and you need help quitting, ask your health care provider. Quitting smoking will help your lungs heal faster.  Use an inhaler, cool mist vaporizer, or humidifier as told by your health care provider.  Keep all follow-up visits as told by your health care provider. This is important. How is this prevented? To lower your risk  of getting this condition again:  Wash your hands often with soap and water. If soap and water are not available, use hand sanitizer.  Avoid contact with people who have cold symptoms.  Try not to touch your hands to your mouth, nose, or eyes.  Make sure to get the flu shot every year.  Contact a health care provider if:  Your symptoms do not improve in 2 weeks of treatment. Get help right away if:  You cough  up blood.  You have chest pain.  You have severe shortness of breath.  You become dehydrated.  You faint or keep feeling like you are going to faint.  You keep vomiting.  You have a severe headache.  Your fever or chills gets worse. This information is not intended to replace advice given to you by your health care provider. Make sure you discuss any questions you have with your health care provider. Document Released: 05/08/2004 Document Revised: 10/24/2015 Document Reviewed: 09/19/2015 Elsevier Interactive Patient Education  [employer].

## 2018-04-19 NOTE — Progress Notes (Addendum)
Subjective:   [name] is a 63 y.o. female who presents for Medicare Annual (Subsequent) preventive examination.   Review of Systems:  No ROS.  Medicare Wellness Visit. Additional risk factors are reflected in the social history.  Cardiac Risk Factors include: hypertension;smoking/ tobacco exposure Sleep patterns: Pt. states she wakes up every few hours throughout the night routinely, but can get herself back to sleep by reading/doing puzzles. Pt. states that she naps during the day which she admits could be contributing to disturbed sleep. Pt. does not think it a significant barrier to her health or well-being.   Home Safety/Smoke Alarms: Feels safe in home. Smoke alarms in place.  Living environment; residence and Firearm Safety: apartment. Lives alone, no needs for DME, good support system  Seat Belt Safety/Bike Helmet: Wears seat belt. Does not drive herself, but has reliable transportation    Objective:     Vitals: BP (!) 160/80 (BP Location: Right Arm, Patient Position: Sitting, Cuff Size: Normal)   Pulse 61   Resp 20   Ht 5\' 6"  (1.676 m)   Wt 175 lb (79.4 kg)   SpO2 98%   BMI 28.25 kg/m   Body mass index is 28.25 kg/m.  Advanced Directives 03/25/2017 10/20/2016 01/10/2016 01/09/2015 01/09/2015 12/19/2014 12/07/2014  Does Patient Have a Medical Advance Directive? No No No No No No No  Would patient like information on creating a medical advance directive? No - Patient declined - No - patient declined information Yes Higher education careers adviser given - No - patient declined information No - patient declined information    Tobacco Social History   Tobacco Use  Smoking Status Current Every Day Smoker  . Packs/day: 0.25  . Years: 40.00  . Pack years: 10.00  . Types: Cigarettes  Smokeless Tobacco Never Used  Tobacco Comment   Pt. in project free program, motivated to stop smoking completely. Declined smoking cessation cone program.     Ready to quit: Yes Counseling  given: No Comment: Pt. in project free program, motivated to stop smoking completely. Declined smoking cessation cone program.    Past Medical History:  Diagnosis Date  . Adenomatous colon polyp   . Anemia, unspecified   . Cocaine abuse ([place])    as recently as 10/21/11  . Depression   . Difficulty sleeping    takes meds to sleep  . Diverticulosis   . Hernia 03/18/2013  . History of transfusion   . Hypertension   . Incontinence of bowel    since colostomy  . Knee fracture, left   . 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 Nonspecific colitis 10/10/11  . Radiation proctitis    mild  . 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 Tibial plateau fracture, left 01/19/2014   Past Surgical History:  Procedure Laterality Date  . APPENDECTOMY N/A 01/09/2015   Procedure: APPENDECTOMY;  Surgeon: Excell Seltzer, MD;  Location: [facility];  Service: General;  Laterality: N/A;  . COLOSTOMY  2013   [facility]  . FINGER FRACTURE SURGERY Left    and hand surgery  . HERNIA REPAIR    . LAPAROSCOPIC PARASTOMAL HERNIA N/A 05/12/2013   Procedure: LAPAROSCOPIC PARASTOMAL HERNIA;  Surgeon: Leighton Ruff, MD;  Location: [facility];  Service: General;  Laterality: N/A;  . LAPAROTOMY N/A 01/09/2015   Procedure: EXPLORATORY LAPAROTOMY;  Surgeon: Excell Seltzer, MD;  Location: [facility];  Service: General;  Laterality: N/A;  RESECTION OF COLOSTOMY,  CREATION OF NEW COLOSTOMY, VENTRAL HERNIA REPAIR WITH BIOLOGIC MESH,  LYSIS OF ADHESIONS , OMENTECTOMY  . LOW ANTERIOR BOWEL RESECTION  03/07/09  . MOUTH SURGERY  10/2009  . PORTACATH PLACEMENT     and removal   Family History  Problem Relation Age of Onset  . Colon cancer Mother   . Diabetes Father   . Cirrhosis Brother   . Stomach cancer Sister   . Colon polyps Sister   . Pulmonary Hypertension Brother    Social History   Socioeconomic History  . Marital status: Significant Other    Spouse name: Not on file  . Number of  children: 0  . Years of education: Not on file  . Highest education level: Not on file  Occupational History  . Occupation: disabiled  Social Needs  . Financial resource strain: Not hard at all  . Food insecurity:    Worry: Never true    Inability: Never true  . Transportation needs:    Medical: No    Non-medical: No  Tobacco Use  . Smoking status: Current Every Day Smoker    Packs/day: 0.25    Years: 40.00    Pack years: 10.00    Types: Cigarettes  . Smokeless tobacco: Never Used  . Tobacco comment: Pt. in project free program, motivated to stop smoking completely. Declined smoking cessation cone program.  Substance and Sexual Activity  . Alcohol use: Yes    Alcohol/week: 12.0 standard drinks    Types: 12 Standard drinks or equivalent per week    Comment: occasional  driink  . Drug use: Yes    Types: Marijuana, Cocaine    Comment: no longer using Marijuana; last use of cocaine nov 2014   . Sexual activity: Not on file  Lifestyle  . Physical activity:    Days per week: 7 days    Minutes per session: 20 min  . Stress: Only a little  Relationships  . Social connections:    Talks on phone: More than three times a week    Gets together: More than three times a week    Attends religious service: More than 4 times per year    Active member of club or organization: Yes    Attends meetings of clubs or organizations: More than 4 times per year    Relationship status: Divorced  Other Topics Concern  . 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 ALPRAZolam  (XANAX) 0.5 MG tablet Take 1 tablet by mouth twice daily as needed for anxiety or sleep.  [name] gabapentin (NEURONTIN) 600 MG tablet Take 2 tablets (1,200 mg total) by mouth 3 (three) times daily.  . hydrochlorothiazide (HYDRODIURIL) 25 MG tablet TAKE 1 TABLET BY MOUTH ONCE DAILY  . lisinopril (PRINIVIL,ZESTRIL) 10 MG tablet Take 1 tablet (10 mg total) by mouth daily.  . naproxen sodium (ANAPROX) 220 MG tablet Take 440 mg by mouth 2 (two) times daily as needed (pain).  [name] azithromycin (ZITHROMAX) 250 MG tablet Take two tabs the first day and then one tab daily for four  days (Patient not taking: Reported on 04/20/2018)  . fenofibrate (TRICOR) 48 MG tablet Take 1 tablet (48 mg total) by mouth daily. (Patient not taking: Reported on 04/20/2018)   No facility-administered encounter medications on file as of 04/20/2018.     Activities of Daily Living In your present state of health, do you have any difficulty performing the following activities: 04/20/2018  Hearing? N  Vision? Y  Difficulty concentrating or making decisions? N  Walking or climbing stairs? Y  Dressing or bathing? N  Doing errands, shopping? N  Using the Toilet? Y  In the past six months, have you accidently leaked urine? N  Do you have problems with loss of bowel control? N  Managing your Medications? N  Managing your Finances? N  Housekeeping or managing your Housekeeping? N  Some recent data might be hidden    Patient Care Team: Binnie Rail, MD as PCP - General (Internal Medicine) Nunzio Cobbs, MD (Obstetrics and Gynecology) Jana Half, DPM (Podiatry) Heath Lark, MD as Consulting Physician (Hematology and Oncology) Leighton Ruff, MD (General Surgery) Armbruster, Carlota Raspberry, MD as Consulting Physician (Gastroenterology)    Assessment:   This is a routine wellness examination for [name]. Physical assessment deferred to PCP.   Exercise Activities and Dietary recommendations Current Exercise Habits: Home  exercise routine, Type of exercise: walking, Time (Minutes): 20, Frequency (Times/Week): 7, Weekly Exercise (Minutes/Week): 140, Intensity: Mild, Exercise limited by: neurologic condition(s)  Diet (meal preparation, eat out, water intake, caffeinated beverages, dairy products, fruits and vegetables): in general, a "healthy" diet  .   Discussed reducing sodium intake d/t high BP. Further encouraged good hydration, as pt. endorsed itchy, dry skin concern.     Goals    . Weight (lb) < 200 lb (90.7 kg)     Stop smoking completely, especially prior to hernia removal per surgeon's recommendation  Lose 15 pounds       Fall Risk Fall Risk  04/20/2018 07/20/2013  Falls in the past year? 1 No  Number falls in past yr: 1 -  Injury with Fall? 1 -  Risk for fall due to : History of fall(s);Impaired balance/gait;Impaired mobility -   Depression Screen PHQ 2/9 Scores 04/20/2018 07/20/2013  PHQ - 2 Score 0 0  PHQ- 9 Score 2 -     Cognitive Function       Ad8 score reviewed for issues:  Issues making decisions: no  Less interest in hobbies / activities: no  Repeats questions, stories (family complaining): no  Trouble using ordinary gadgets (microwave, computer, phone):no  Forgets the month or year: no  Mismanaging finances: no  Remembering appts: no  Daily problems with thinking and/or memory: no Ad8 score is= 0    Immunization History  Administered Date(s) Administered  . Influenza,inj,Quad PF,6+ Mos 03/18/2013, 05/23/2014, 01/10/2016, 04/03/2017, 12/09/2017  . Pneumococcal Conjugate-13 05/23/2014  . Pneumococcal Polysaccharide-23 06/12/2016  . Td 05/01/2010     Screening Tests Health Maintenance  Topic Date Due  . OPHTHALMOLOGY EXAM  06/09/1966  . PAP SMEAR-Modifier  12/12/2013  . MAMMOGRAM  08/16/2014  . HEMOGLOBIN A1C  06/11/2018  . FOOT EXAM  12/10/2018  . COLONOSCOPY  01/09/2020  . TETANUS/TDAP  05/01/2020  . DEXA SCAN  12/04/2020  . INFLUENZA VACCINE  Completed    . PNEUMOCOCCAL POLYSACCHARIDE VACCINE AGE 84-64 HIGH RISK  Completed  . Hepatitis C Screening  Completed  . HIV Screening  Completed      Plan:  Discussed with pt. need to follow-up with GYN to make appointment for PAP, and pt. verbalized understanding. Mammogram order placed. Pt. to follow up with ophthamology resources given.  I have personally reviewed and noted the following in the patient's chart:   . Medical and social history . Use of alcohol, tobacco or illicit drugs  . Current medications and supplements . Functional ability and status . Nutritional status . Physical activity . Advanced directives . List of other physicians . Vitals . Screenings to include cognitive, depression, and falls . Referrals and appointments  In addition, I have reviewed and discussed with patient certain preventive protocols, quality metrics, and best practice recommendations. A written personalized care plan for preventive services as well as general preventive health recommendations were provided to patient.     Alphia Moh, RN  04/20/2018    Medical screening examination/treatment/procedure(s) were performed by non-physician practitioner and as supervising physician I was immediately available for consultation/collaboration. I agree with above. Binnie Rail, MD

## 2018-04-20 ENCOUNTER — Telehealth: Payer: Self-pay

## 2018-04-20 ENCOUNTER — Ambulatory Visit (INDEPENDENT_AMBULATORY_CARE_PROVIDER_SITE_OTHER): Payer: Medicare Other | Admitting: *Deleted

## 2018-04-20 VITALS — BP 160/80 | HR 61 | Resp 20 | Ht 66.0 in | Wt 175.0 lb

## 2018-04-20 DIAGNOSIS — Z1231 Encounter for screening mammogram for malignant neoplasm of breast: Secondary | ICD-10-CM

## 2018-04-20 DIAGNOSIS — Z0000 Encounter for general adult medical examination without abnormal findings: Secondary | ICD-10-CM

## 2018-04-20 MED ORDER — BLOOD GLUCOSE MONITOR KIT: PACK | 0 refills | Status: DC

## 2018-04-20 NOTE — Telephone Encounter (Signed)
During AWV, patient expressed interest in starting to check her BG routinely with a meter. Meter supplies needed. Routed to PCP to advise.

## 2018-04-20 NOTE — Telephone Encounter (Signed)
Prescription for glucometer printed-please fax to pharmacy.  Let patient know.

## 2018-04-20 NOTE — Patient Instructions (Addendum)
[name] , Thank you for taking time to come for your Medicare Wellness Visit. I appreciate your ongoing commitment to your health goals. Please review the following plan we discussed and let me know if I can assist you in the future.   These are the goals we discussed: Goals    . Weight (lb) < 200 lb (90.7 kg)     Stop smoking completely, especially prior to hernia removal per surgeon's recommendation  Lose 15 pounds       This is a list of the screening recommended for you and due dates:  Health Maintenance  Topic Date Due  . Eye exam for diabetics  06/09/1966  . Pap Smear  12/12/2013  . Mammogram  08/16/2014  . Hemoglobin A1C  06/11/2018  . Complete foot exam   12/10/2018  . Colon Cancer Screening  01/09/2020  . Tetanus Vaccine  05/01/2020  . DEXA scan (bone density measurement)  12/04/2020  . Flu Shot  Completed  . Pneumococcal vaccine  Completed  .  Hepatitis C: One time screening is recommended by Center for Disease Control  (CDC) for  adults born from [birth year] through 1964.   Completed  . HIV Screening  Completed    It is important to avoid accidents which may result in broken bones.  Here are a few ideas on how to make your home safer so you will be less likely to trip or fall.  1. Use nonskid mats or non slip strips in your shower or tub, on your bathroom floor and around sinks.  If you know that you have spilled water, wipe it up! 2. In the bathroom, it is important to have properly installed grab bars on the walls or on the edge of the tub.  Towel racks are NOT strong enough for you to hold onto or to pull on for support. 3. Stairs and hallways should have enough light.  Add lamps or night lights if you need ore light. 4. It is good to have handrails on both sides of the stairs if possible.  Always fix broken handrails right away. 5. It is important to see the edges of steps.  Paint the edges of outdoor steps white so you can see them better.  Put colored tape on the  edge of inside steps. 6. Throw-rugs are dangerous because they can slide.  Removing the rugs is the best idea, but if they must stay, add adhesive carpet tape to prevent slipping. 7. Do not keep things on stairs or in the halls.  Remove small furniture that blocks the halls as it may cause you to trip.  Keep telephone and electrical cords out of the way where you walk. 8. Always were sturdy, rubber-soled shoes for good support.  Never wear just socks, especially on the stairs.  Socks may cause you to slip or fall.  Do not wear full-length housecoats as you can easily trip on the bottom.  9. Place the things you use the most on the shelves that are the easiest to reach.  If you use a stepstool, make sure it is in good condition.  If you feel unsteady, DO NOT climb, ask for help. 10. If a health professional advises you to use a cane or walker, do not be ashamed.  These items can keep you from falling and breaking your bones.  Diabetes Mellitus and Foot Care Foot care is an important part of your health, especially when you have diabetes. Diabetes may cause you  to have problems because of poor blood flow (circulation) to your feet and legs, which can cause your skin to:  Become thinner and drier.  Break more easily.  Griffin more slowly.  Peel and crack. You may also have nerve damage (neuropathy) in your legs and feet, causing decreased feeling in them. This means that you may not notice minor injuries to your feet that could lead to more serious problems. Noticing and addressing any potential problems early is the best way to prevent future foot problems. How to care for your feet Foot hygiene  Wash your feet daily with warm water and mild soap. Do not use hot water. Then, pat your feet and the areas between your toes until they are completely dry. Do not soak your feet as this can dry your skin.  Trim your toenails straight across. Do not dig under them or around the cuticle. File the edges of  your nails with an emery board or nail file.  Apply a moisturizing lotion or petroleum jelly to the skin on your feet and to dry, brittle toenails. Use lotion that does not contain alcohol and is unscented. Do not apply lotion between your toes. Shoes and socks  Wear clean socks or stockings every day. Make sure they are not too tight. Do not wear knee-high stockings since they may decrease blood flow to your legs.  Wear shoes that fit properly and have enough cushioning. Always look in your shoes before you put them on to be sure there are no objects inside.  To break in new shoes, wear them for just a few hours a day. This prevents injuries on your feet. Wounds, scrapes, corns, and calluses  Check your feet daily for blisters, cuts, bruises, sores, and redness. If you cannot see the bottom of your feet, use a mirror or ask someone for help.  Do not cut corns or calluses or try to remove them with medicine.  If you find a minor scrape, cut, or break in the skin on your feet, keep it and the skin around it clean and dry. You may clean these areas with mild soap and water. Do not clean the area with peroxide, alcohol, or iodine.  If you have a wound, scrape, corn, or callus on your foot, look at it several times a day to make sure it is healing and not infected. Check for: ? Redness, swelling, or pain. ? Fluid or blood. ? Warmth. ? Pus or a bad smell. General instructions  Do not cross your legs. This may decrease blood flow to your feet.  Do not use heating pads or hot water bottles on your feet. They may burn your skin. If you have lost feeling in your feet or legs, you may not know this is happening until it is too late.  Protect your feet from hot and cold by wearing shoes, such as at the beach or on hot pavement.  Schedule a complete foot exam at least once a year (annually) or more often if you have foot problems. If you have foot problems, report any cuts, sores, or bruises to  your health care provider immediately. Contact a health care provider if:  You have a medical condition that increases your risk of infection and you have any cuts, sores, or bruises on your feet.  You have an injury that is not healing.  You have redness on your legs or feet.  You feel burning or tingling in your legs or feet.  You have pain or cramps in your legs and feet.  Your legs or feet are numb.  Your feet always feel cold.  You have pain around a toenail. Get help right away if:  You have a wound, scrape, corn, or callus on your foot and: ? You have pain, swelling, or redness that gets worse. ? You have fluid or blood coming from the wound, scrape, corn, or callus. ? Your wound, scrape, corn, or callus feels warm to the touch. ? You have pus or a bad smell coming from the wound, scrape, corn, or callus. ? You have a fever. ? You have a red line going up your leg. Summary  Check your feet every day for cuts, sores, red spots, swelling, and blisters.  Moisturize feet and legs daily.  Wear shoes that fit properly and have enough cushioning.  If you have foot problems, report any cuts, sores, or bruises to your health care provider immediately.  Schedule a complete foot exam at least once a year (annually) or more often if you have foot problems. This information is not intended to replace advice given to you by your health care provider. Make sure you discuss any questions you have with your health care provider. Document Released: 03/28/2000 Document Revised: 05/13/2017 Document Reviewed: 05/02/2016 Elsevier Interactive Patient Education  2019 [employer]  Peripheral Neuropathy Peripheral neuropathy is a type of nerve damage. It affects nerves that carry signals between the spinal cord and the arms, legs, and the rest of the body (peripheral nerves). It does not affect nerves in the spinal cord or brain. In peripheral neuropathy, one nerve or a group of nerves may  be damaged. Peripheral neuropathy is a broad category that includes many specific nerve disorders, like diabetic neuropathy, hereditary neuropathy, and carpal tunnel syndrome. What are the causes? This condition may be caused by:  Diabetes. This is the most common cause of peripheral neuropathy.  Nerve injury.  Pressure or stress on a nerve that lasts a long time.  Lack (deficiency) of B vitamins. This can result from alcoholism, poor diet, or a restricted diet.  Infections.  Autoimmune diseases, such as rheumatoid arthritis and systemic lupus erythematosus.  Nerve diseases that are passed from parent to child (inherited).  Some medicines, such as cancer medicines (chemotherapy).  Poisonous (toxic) substances, such as lead and mercury.  Too little blood flowing to the legs.  Kidney disease.  Thyroid disease. In some cases, the cause of this condition is not known. What are the signs or symptoms? Symptoms of this condition depend on which of your nerves is damaged. Common symptoms include:  Loss of feeling (numbness) in the feet, hands, or both.  Tingling in the feet, hands, or both.  Burning pain.  Very sensitive skin.  Weakness.  Not being able to move a part of the body (paralysis).  Muscle twitching.  Clumsiness or poor coordination.  Loss of balance.  Not being able to control your bladder.  Feeling dizzy.  Sexual problems. How is this diagnosed? Diagnosing and finding the cause of peripheral neuropathy can be difficult. Your health care provider will take your medical history and do a physical exam. A neurological exam will also be done. This involves checking things that are affected by your brain, spinal cord, and nerves (nervous system). For example, your health care provider will check your reflexes, how you move, and what you can feel. You may have other tests, such as:  Blood tests.  Electromyogram (EMG) and nerve  conduction tests. These tests  check nerve function and how well the nerves are controlling the muscles.  Imaging tests, such as CT scans or MRI to rule out other causes of your symptoms.  Removing a small piece of nerve to be examined in a lab (nerve biopsy). This is rare.  Removing and examining a small amount of the fluid that surrounds the brain and spinal cord (lumbar puncture). This is rare. How is this treated? Treatment for this condition may involve:  Treating the underlying cause of the neuropathy, such as diabetes, kidney disease, or vitamin deficiencies.  Stopping medicines that can cause neuropathy, such as chemotherapy.  Medicine to relieve pain. Medicines may include: ? Prescription or over-the-counter pain medicine. ? Antiseizure medicine. ? Antidepressants. ? Pain-relieving patches that are applied to painful areas of skin.  Surgery to relieve pressure on a nerve or to destroy a nerve that is causing pain.  Physical therapy to help improve movement and balance.  Devices to help you move around (assistive devices). Follow these instructions at home: Medicines  Take over-the-counter and prescription medicines only as told by your health care provider. Do not take any other medicines without first asking your health care provider.  Do not drive or use heavy machinery while taking prescription pain medicine. Lifestyle   Do not use any products that contain nicotine or tobacco, such as cigarettes and e-cigarettes. Smoking keeps blood from reaching damaged nerves. If you need help quitting, ask your health care provider.  Avoid or limit alcohol. Too much alcohol can cause a vitamin B deficiency, and vitamin B is needed for healthy nerves.  Eat a healthy diet. This includes: ? Eating foods that are high in fiber, such as fresh fruits and vegetables, whole grains, and beans. ? Limiting foods that are high in fat and processed sugars, such as fried or sweet foods. General instructions   If you  have diabetes, work closely with your health care provider to keep your blood sugar under control.  If you have numbness in your feet: ? Check every day for signs of injury or infection. Watch for redness, warmth, and swelling. ? Wear padded socks and comfortable shoes. These help protect your feet.  Develop a good support system. Living with peripheral neuropathy can be stressful. Consider talking with a mental health specialist or joining a support group.  Use assistive devices and attend physical therapy as told by your health care provider. This may include using a walker or a cane.  Keep all follow-up visits as told by your health care provider. This is important. Contact a health care provider if:  You have new signs or symptoms of peripheral neuropathy.  You are struggling emotionally from dealing with peripheral neuropathy.  Your pain is not well-controlled. Get help right away if:  You have an injury or infection that is not healing normally.  You develop new weakness in an arm or leg.  You fall frequently. Summary  Peripheral neuropathy is when the nerves in the arms, or legs are damaged, resulting in numbness, weakness, or pain.  There are many causes of peripheral neuropathy, including diabetes, pinched nerves, vitamin deficiencies, autoimmune disease, and hereditary conditions.  Diagnosing and finding the cause of peripheral neuropathy can be difficult. Your health care provider will take your medical history, do a physical exam, and do tests, including blood tests and nerve function tests.  Treatment involves treating the underlying cause of the neuropathy and taking medicines to help control pain. Physical therapy  and assistive devices may also help. This information is not intended to replace advice given to you by your health care provider. Make sure you discuss any questions you have with your health care provider. Document Released: 03/21/2002 Document Revised:  06/09/2016 Document Reviewed: 06/09/2016 Elsevier Interactive Patient Education  2019 [place] exam due: [facility] [place] [place] in: [place] Address: [address] [place], [place] [address] Phone: [phone]  Mammogram: will order per Dr. Quay Burow approval for breast center

## 2018-04-20 NOTE — Telephone Encounter (Signed)
Rx faxed. LVM letting pt know.

## 2018-05-18 IMAGING — CR DG FOOT COMPLETE 3+V*R*
3 series · 3 of 3 positions shown · non-contrast
Comparison: None.

CLINICAL DATA: Right foot pain and tenderness without known injury.

EXAM:
RIGHT FOOT COMPLETE - 3+ VIEW

[x foot ap right]
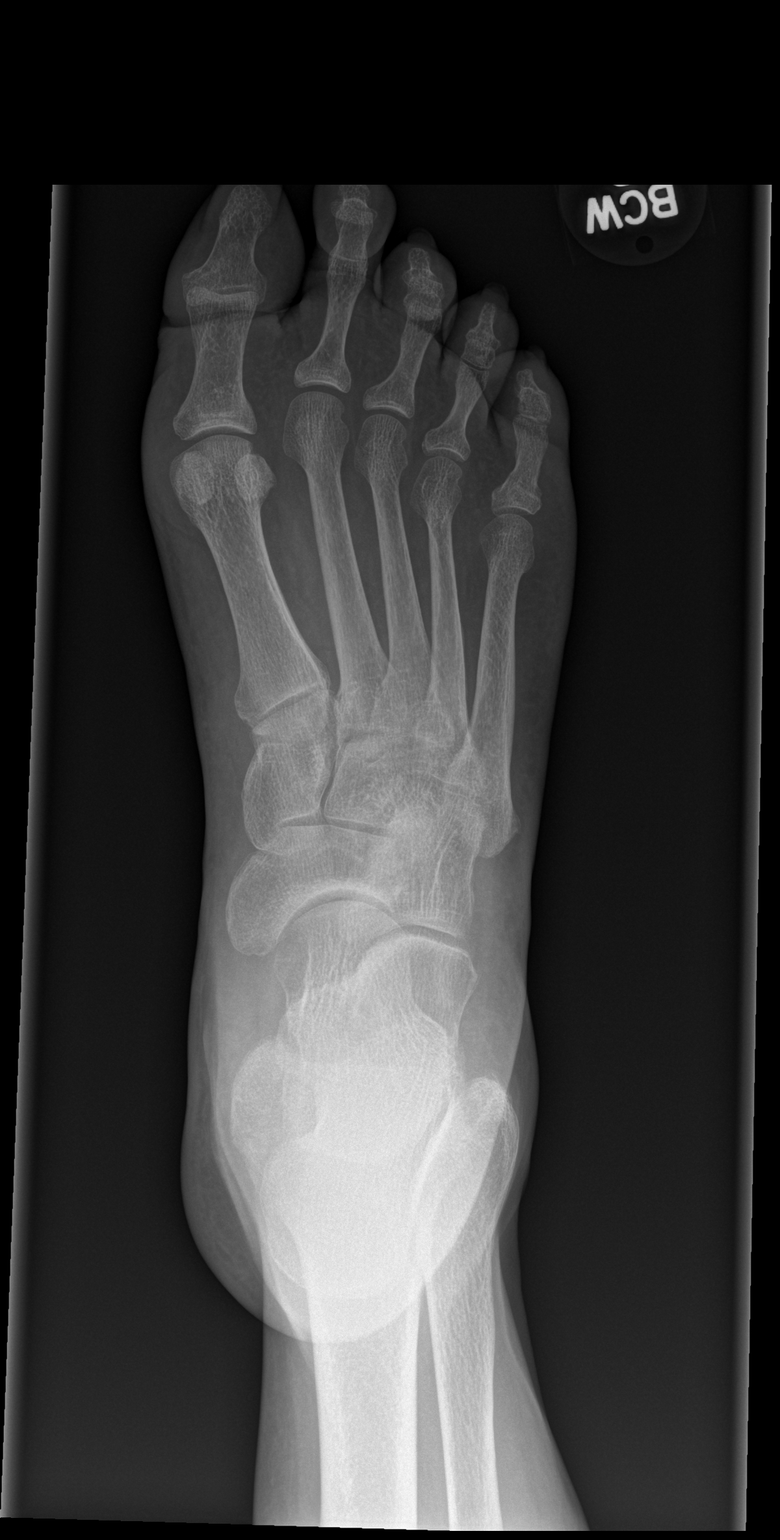

[x foot obl right]
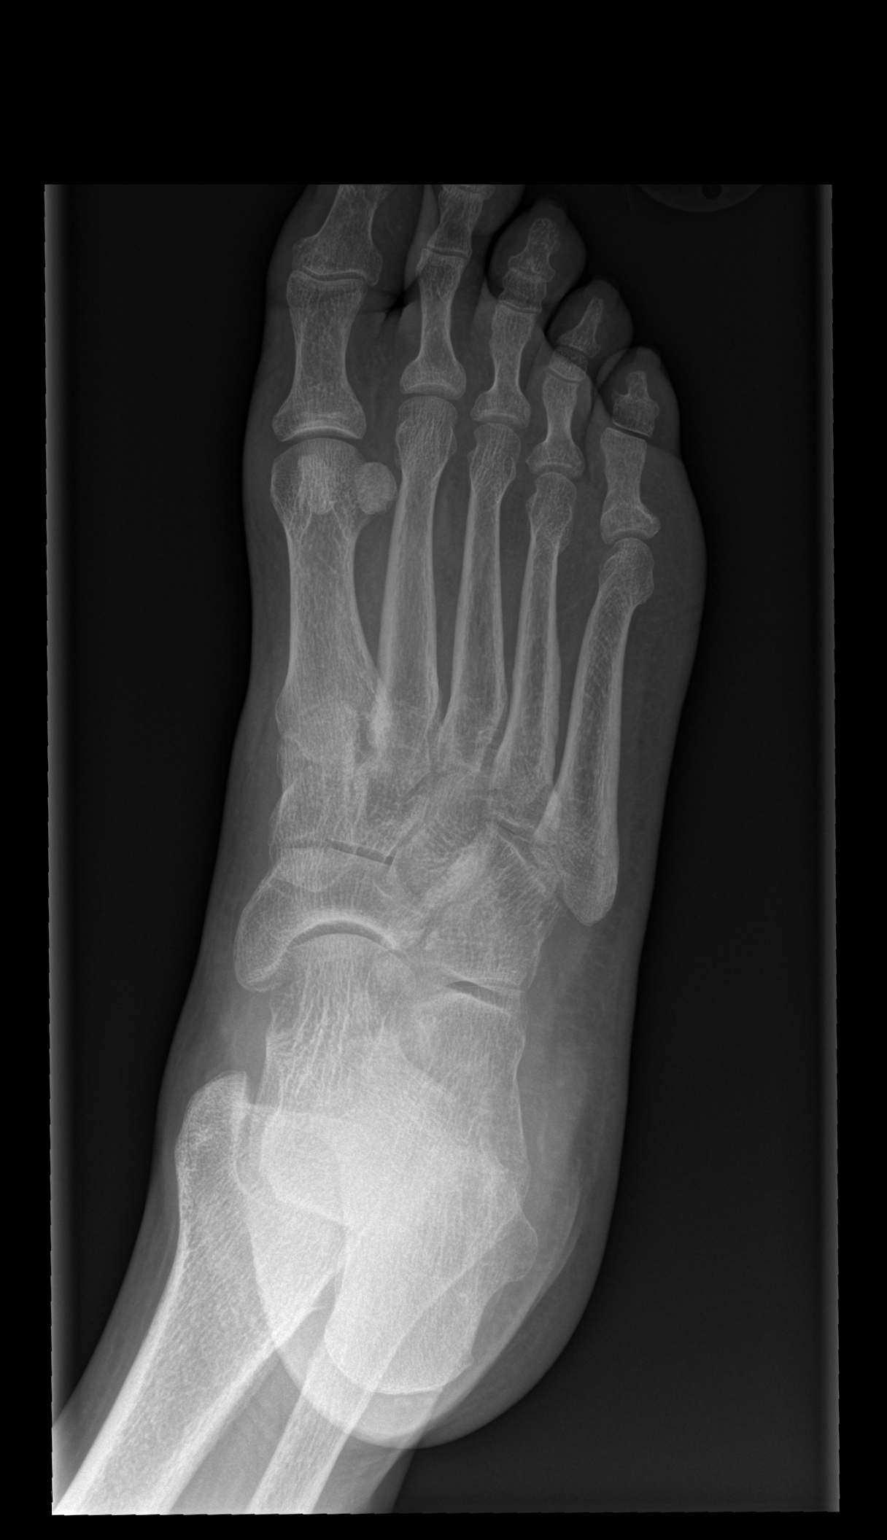

[x foot lat right]
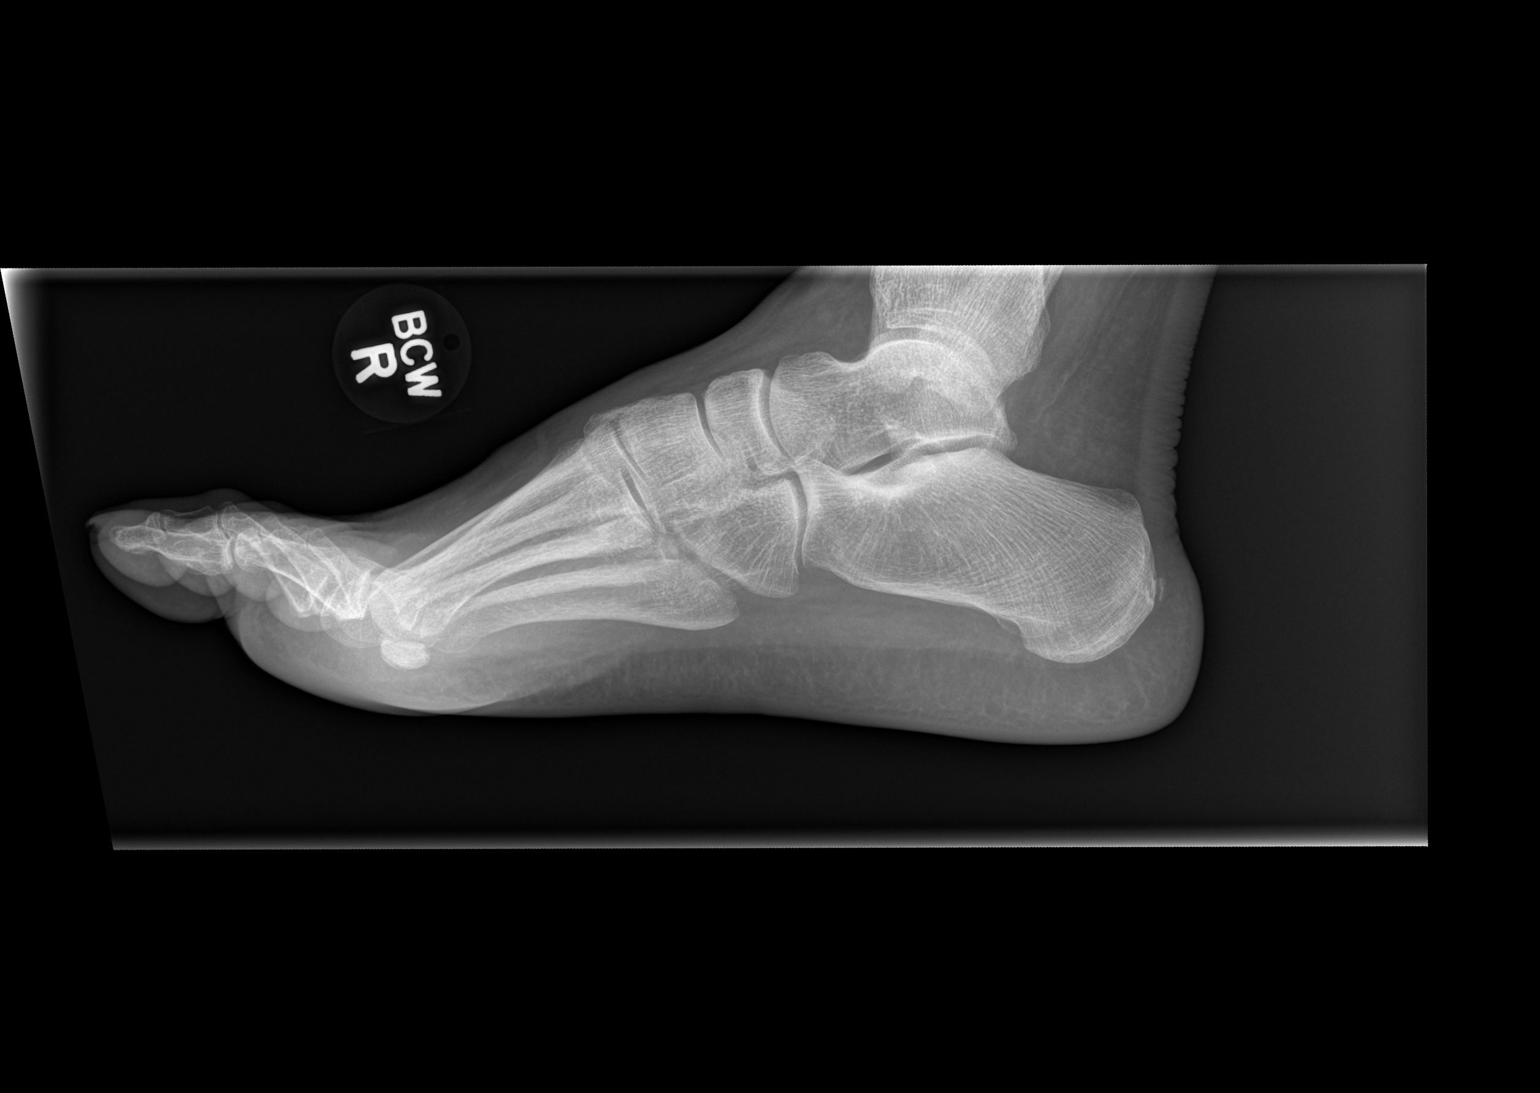

[3 of 3 positions shown; findings below may reference images not displayed]

FINDINGS: There is no evidence of fracture or dislocation. There is no
evidence of arthropathy or other focal bone abnormality. Soft
tissues are unremarkable.
IMPRESSION: Negative.

## 2018-05-31 ENCOUNTER — Ambulatory Visit: Payer: Self-pay

## 2018-05-31 NOTE — Telephone Encounter (Signed)
  Patient called and asked what is a normal blood sugar, because her blood sugar was 177 yesterday afternoon. She says she is not taking Metformin, because she had side effects from it. She says she's having symptoms of frequent urination, frequent thirst, weakness and nausea for 2-3 days, but is cleared up now. She says she has an appointment in March, I advised on 06/15/18, but is too far out if her blood sugars are up and having symptoms. Home care advice given, advised to check fasting blood sugar and 2 hours after meals, advised to call back in 2-3 days for elevated blood sugars above the 177 reading, she verbalized understanding. I advised I will send to Dr. Quay Burow and someone will call with her recommendation.   Reason for Disposition . Blood glucose 70-240 mg/dL (3.9 -13.3 mmol/L)  Answer Assessment - Initial Assessment Questions 1. BLOOD GLUCOSE: "What is your blood glucose level?"      177 2. ONSET: "When did you check the blood glucose?"     Yesterday 3. USUAL RANGE: "What is your glucose level usually?" (e.g., usual fasting morning value, usual evening value)     I don't know, it's not checked every day 4. KETONES: "Do you check for ketones (urine or blood test strips)?" If yes, ask: "What does the test show now?"      No 5. TYPE 1 or 2:  "Do you know what type of diabetes you have?"  (e.g., Type 1, Type 2, Gestational; doesn't know)      Neither, borderline 6. INSULIN: "Do you take insulin?" "What type of insulin(s) do you use? What is the mode of delivery? (syringe, pen (e.g., injection or  pump)?"      No 7. DIABETES PILLS: "Do you take any pills for your diabetes?" If yes, ask: "Have you missed taking any pills recently?"     Was on Metformin, but it was causing problems 8. OTHER SYMPTOMS: "Do you have any symptoms?" (e.g., fever, frequent urination, difficulty breathing, dizziness, weakness, vomiting)     Weakness, frequent urination, nauseated for 2-3 days (cleared up now),  thirsty 9. PREGNANCY: "Is there any chance you are pregnant?" "When was your last menstrual period?"     No  Protocols used: DIABETES - HIGH BLOOD SUGAR-A-AH

## 2018-06-01 NOTE — Telephone Encounter (Signed)
LVM letting pt know that Dr. Quay Burow is out of the office until Monday but if she continues to have concerns about her sugars she can call and set up an appointment with another provider in the office to discuss this issue further.

## 2018-06-04 ENCOUNTER — Ambulatory Visit: Payer: Medicare Other

## 2018-06-14 NOTE — Progress Notes (Signed)
Subjective:    Patient ID: [name], female    DOB: July 11, 1955, 63 y.o.   MRN: [id number]  HPI The patient is here for follow up.  Diabetes: She is taking her medication daily as prescribed. She is compliant with a diabetic diet. She is not exercising regularly - walks in nicer weather. She monitors her sugars and they have been running 120-180's. She checks her feet daily and denies foot lesions. She is not up-to-date with an ophthalmology examination.   Hypertension: She is taking her medication daily. She is compliant with a low sodium diet.  She denies chest pain, palpitations, edema, shortness of breath and regular headaches. She is not exercising regularly.  She does not monitor her blood pressure at home.    Anxiety: She is taking her medication daily as prescribed. She denies any side effects from the medication. She feels her anxiety is well controlled and she is happy with her current dose of medication.   Peripheral neuropathy:  She takes gabapentin three times a day. The medication helps and controls her symptoms.  She checks her feet daily.    Tobacco abuse:  She is still smoking, but less.     Medications and allergies reviewed with patient and updated if appropriate.  Patient Active Problem List   Diagnosis Date Noted  . B12 deficiency 06/15/2016  . Anxiety 06/12/2016  . Type 2 diabetes mellitus with diabetic autonomic neuropathy, without long-term current use of insulin ([place]) 01/10/2016  . Colon perforation ([place]) 01/10/2015  . Multiple falls 01/19/2014  . Colostomy malfunction ([place]) 12/09/2012  . Peristomal hernia 04/19/2012  . Seborrheic dermatitis 08/09/2010  . PERSONAL HX RECTAL CANCER 06/05/2010  . SMOKER 04/16/2010  . ALLERGIC RHINITIS 04/16/2010  . Peripheral neuropathy ([place]) 12/06/2009  . Essential hypertension 12/06/2009  . Fatigue 12/06/2009  . Diarrhea 12/06/2009    Current Outpatient Medications on File Prior to Visit  Medication Sig  Dispense Refill  . ALPRAZolam (XANAX) 0.5 MG tablet Take 1 tablet by mouth twice daily as needed for anxiety or sleep. 60 tablet 1  . blood glucose meter kit and supplies KIT Dispense based on patient and insurance preference. Use up to four times daily as directed. (FOR E11.9). 1 each 0  . gabapentin (NEURONTIN) 600 MG tablet Take 2 tablets (1,200 mg total) by mouth 3 (three) times daily. 540 tablet 1  . hydrochlorothiazide (HYDRODIURIL) 25 MG tablet TAKE 1 TABLET BY MOUTH ONCE DAILY 90 tablet 1  . lisinopril (PRINIVIL,ZESTRIL) 10 MG tablet Take 1 tablet (10 mg total) by mouth daily. 90 tablet 3  . naproxen sodium (ANAPROX) 220 MG tablet Take 440 mg by mouth 2 (two) times daily as needed (pain).     No current facility-administered medications on file prior to visit.     Past Medical History:  Diagnosis Date  . Adenomatous colon polyp   . Anemia, unspecified   . Cocaine abuse ([place])    as recently as 10/21/11  . Depression   . Difficulty sleeping    takes meds to sleep  . Diverticulosis   . Hernia 03/18/2013  . History of transfusion   . Hypertension   . Incontinence of bowel    since colostomy  . Knee fracture, left   . 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 Nonspecific colitis 10/10/11  . Radiation proctitis    mild  . 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 Tibial  plateau fracture, left 01/19/2014    Past Surgical History:  Procedure Laterality Date  . APPENDECTOMY N/A 01/09/2015   Procedure: APPENDECTOMY;  Surgeon: Excell Seltzer, MD;  Location: [facility];  Service: General;  Laterality: N/A;  . COLOSTOMY  2013   [facility]  . FINGER FRACTURE SURGERY Left    and hand surgery  . HERNIA REPAIR    . LAPAROSCOPIC PARASTOMAL HERNIA N/A 05/12/2013   Procedure: LAPAROSCOPIC PARASTOMAL HERNIA;  Surgeon: Leighton Ruff, MD;  [facility];  Service: General;  Laterality: N/A;  . LAPAROTOMY N/A 01/09/2015   Procedure: EXPLORATORY LAPAROTOMY;   Surgeon: Excell Seltzer, MD;  Location: [facility];  Service: General;  Laterality: N/A;  RESECTION OF COLOSTOMY, CREATION OF NEW COLOSTOMY, VENTRAL HERNIA REPAIR WITH BIOLOGIC MESH,  LYSIS OF ADHESIONS , OMENTECTOMY  . LOW ANTERIOR BOWEL RESECTION  03/07/09  . MOUTH SURGERY  10/2009  . PORTACATH PLACEMENT     and removal    Social History   Socioeconomic History  . Marital status: Significant Other    Spouse name: Not on file  . Number of children: 0  . Years of education: Not on file  . Highest education level: Not on file  Occupational History  . Occupation: disabiled  Social Needs  . Financial resource strain: Not hard at all  . Food insecurity:    Worry: Never true    Inability: Never true  . Transportation needs:    Medical: No    Non-medical: No  Tobacco Use  . Smoking status: Current Every Day Smoker    Packs/day: 0.25    Years: 40.00    Pack years: 10.00    Types: Cigarettes  . Smokeless tobacco: Never Used  . Tobacco comment: Pt. in project free program, motivated to stop smoking completely. Declined smoking cessation cone program.  Substance and Sexual Activity  . Alcohol use: Yes    Alcohol/week: 12.0 standard drinks    Types: 12 Standard drinks or equivalent per week    Comment: occasional  driink  . Drug use: Yes    Types: Marijuana, Cocaine    Comment: no longer using Marijuana; last use of cocaine nov 2014   . Sexual activity: Not on file  Lifestyle  . Physical activity:    Days per week: 7 days    Minutes per session: 20 min  . Stress: Only a little  Relationships  . Social connections:    Talks on phone: More than three times a week    Gets together: More than three times a week    Attends religious service: More than 4 times per year    Active member of club or organization: Yes    Attends meetings of clubs or organizations: More than 4 times per year    Relationship status: Divorced  Other Topics Concern  . 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 Colon cancer Mother   . Diabetes Father   . Cirrhosis Brother   . Stomach cancer Sister   . Colon polyps Sister   . Pulmonary Hypertension  Brother     Review of Systems  Constitutional: Negative for chills and fever.  Respiratory: Positive for cough and wheezing. Negative for shortness of breath.   Cardiovascular: Negative for chest pain, palpitations and leg swelling.  Neurological: Negative for light-headedness and headaches.       Objective:   Vitals:   06/15/18 0742  BP: 124/78  Pulse: 75  Resp: 16  Temp: 98.6 F (37 C)  SpO2: 96%   BP Readings from Last 3 Encounters:  06/15/18 124/78  04/20/18 (!) 160/80  03/17/18 (!) 148/80   Wt Readings from Last 3 Encounters:  06/15/18 172 lb 12.8 oz (78.4 kg)  04/20/18 175 lb (79.4 kg)  03/17/18 165 lb (74.8 kg)   Body mass index is 27.89 kg/m.   Physical Exam    Constitutional: Appears well-developed and well-nourished. No distress.  HENT:  Head: Normocephalic and atraumatic.  Neck: Neck supple. No tracheal deviation present. No thyromegaly present.  No cervical lymphadenopathy Cardiovascular: Normal rate, regular rhythm and normal heart sounds.   No murmur heard. No carotid bruit .  No edema Pulmonary/Chest: Effort normal and breath sounds normal. No respiratory distress. No has no wheezes. No rales.  Skin: Skin is warm and dry. Not diaphoretic.  Psychiatric: Normal mood and affect. Behavior is normal.      Assessment & Plan:    See Problem List for Assessment and Plan of chronic medical  problems.

## 2018-06-14 NOTE — Patient Instructions (Addendum)

## 2018-06-15 ENCOUNTER — Other Ambulatory Visit (INDEPENDENT_AMBULATORY_CARE_PROVIDER_SITE_OTHER): Payer: Medicare Other

## 2018-06-15 ENCOUNTER — Encounter: Payer: Self-pay | Admitting: Internal Medicine

## 2018-06-15 ENCOUNTER — Ambulatory Visit (INDEPENDENT_AMBULATORY_CARE_PROVIDER_SITE_OTHER): Payer: Medicare Other | Admitting: Internal Medicine

## 2018-06-15 VITALS — BP 124/78 | HR 75 | Temp 98.6°F | Resp 16 | Ht 66.0 in | Wt 172.8 lb

## 2018-06-15 DIAGNOSIS — E1143 Type 2 diabetes mellitus with diabetic autonomic (poly)neuropathy: Secondary | ICD-10-CM

## 2018-06-15 DIAGNOSIS — G6289 Other specified polyneuropathies: Secondary | ICD-10-CM | POA: Diagnosis not present

## 2018-06-15 DIAGNOSIS — F419 Anxiety disorder, unspecified: Secondary | ICD-10-CM

## 2018-06-15 DIAGNOSIS — E538 Deficiency of other specified B group vitamins: Secondary | ICD-10-CM

## 2018-06-15 DIAGNOSIS — I10 Essential (primary) hypertension: Secondary | ICD-10-CM | POA: Diagnosis not present

## 2018-06-15 LAB — LIPID PANEL
Cholesterol: 329 mg/dL — ABNORMAL HIGH (ref 0–200)
HDL: 35.4 mg/dL — ABNORMAL LOW (ref >39.00)
Total CHOL/HDL Ratio: 9
Triglycerides: 1789 mg/dL — ABNORMAL HIGH (ref 0.0–149.0)

## 2018-06-15 LAB — COMPREHENSIVE METABOLIC PANEL
ALT: 54 U/L — ABNORMAL HIGH (ref 0–35)
AST: 52 U/L — ABNORMAL HIGH (ref 0–37)
Albumin: 4.1 g/dL (ref 3.5–5.2)
Alkaline Phosphatase: 148 U/L — ABNORMAL HIGH (ref 39–117)
BUN: 20 mg/dL (ref 6–23)
CO2: 20 mEq/L (ref 19–32)
Calcium: 9.2 mg/dL (ref 8.4–10.5)
Chloride: 100 mEq/L (ref 96–112)
Creatinine, Ser: 0.87 mg/dL (ref 0.40–1.20)
GFR: 79.83 mL/min (ref >60.00)
Glucose, Bld: 110 mg/dL — ABNORMAL HIGH (ref 70–99)
Potassium: 3.8 mEq/L (ref 3.5–5.1)
Sodium: 135 mEq/L (ref 135–145)
Total Bilirubin: 0.5 mg/dL (ref 0.2–1.2)
Total Protein: 7 g/dL (ref 6.0–8.3)

## 2018-06-15 LAB — VITAMIN B12: Vitamin B-12: 144 pg/mL — ABNORMAL LOW (ref 211–911)

## 2018-06-15 LAB — HEMOGLOBIN A1C: Hgb A1c MFr Bld: 6.6 % — ABNORMAL HIGH (ref 4.6–6.5)

## 2018-06-15 LAB — LDL CHOLESTEROL, DIRECT: Direct LDL: 52 mg/dL

## 2018-06-15 MED ORDER — ALPRAZOLAM 0.5 MG PO TABS: ORAL_TABLET | ORAL | 1 refills | Status: DC

## 2018-06-15 MED ORDER — HYDROCHLOROTHIAZIDE 25 MG PO TABS: 25.0000 mg | ORAL_TABLET | Freq: Every day | ORAL | 1 refills | Status: DC

## 2018-06-15 MED ORDER — GABAPENTIN 600 MG PO TABS: 1200.0000 mg | ORAL_TABLET | Freq: Three times a day (TID) | ORAL | 1 refills | Status: DC

## 2018-06-15 MED ORDER — LISINOPRIL 10 MG PO TABS: 10.0000 mg | ORAL_TABLET | Freq: Every day | ORAL | 3 refills | Status: DC

## 2018-06-15 NOTE — Assessment & Plan Note (Signed)
Has not been as compliant with diabetic diet or exercise the past couple of months - sugars higher at home than usual Has decreased carbs and will try to increase activity She wants to get her sugars controlled with lifestyle Check a1c Will schedule eye appt Checks feet daily

## 2018-06-15 NOTE — Assessment & Plan Note (Signed)
Takes xanax twice daily Effective, no side effects Controlled Will renew [place] controlled substance database checked.

## 2018-06-15 NOTE — Assessment & Plan Note (Signed)
Checking her feet daily Taking gabapentin three times a day controlled

## 2018-06-15 NOTE — Assessment & Plan Note (Signed)
Taking centrum MVI Check B12 level

## 2018-06-15 NOTE — Assessment & Plan Note (Signed)
BP well controlled Current regimen effective and well tolerated Continue current medications at current doses cmp  

## 2018-06-17 ENCOUNTER — Other Ambulatory Visit: Payer: Self-pay | Admitting: Internal Medicine

## 2018-06-18 ENCOUNTER — Ambulatory Visit (INDEPENDENT_AMBULATORY_CARE_PROVIDER_SITE_OTHER): Payer: Medicare Other

## 2018-06-18 ENCOUNTER — Other Ambulatory Visit: Payer: Self-pay | Admitting: Internal Medicine

## 2018-06-18 DIAGNOSIS — E538 Deficiency of other specified B group vitamins: Secondary | ICD-10-CM

## 2018-06-18 MED ORDER — CYANOCOBALAMIN 1000 MCG/ML IJ SOLN
1000.0000 ug | Freq: Once | INTRAMUSCULAR | Status: AC
  Administered 2018-06-18: 1000 ug via INTRAMUSCULAR

## 2018-06-18 MED ORDER — FENOFIBRATE 145 MG PO TABS: 145.0000 mg | ORAL_TABLET | Freq: Every day | ORAL | 5 refills | Status: DC

## 2018-06-18 NOTE — Progress Notes (Signed)
b12 Injection given.   Kimberly Bohne J [name], MD  

## 2018-06-22 ENCOUNTER — Ambulatory Visit: Payer: Self-pay

## 2018-06-22 ENCOUNTER — Telehealth: Payer: Self-pay | Admitting: Internal Medicine

## 2018-06-22 NOTE — Telephone Encounter (Signed)
LVM for pt to call back for response.  

## 2018-06-22 NOTE — Telephone Encounter (Signed)
Incoming call from  Patient stating that on her lab  Result states that her liver enzymes are slightly  Elevated.  Patient is requesting what  She she do about them being elevated  Does she need medication or anything.  States she is a colo cancer survivor.  Patient  Request  A return  Call.

## 2018-06-22 NOTE — Telephone Encounter (Signed)
Patient called and advised of the results below by Dr. Quay Burow, patient verbalized understanding.

## 2018-06-22 NOTE — Telephone Encounter (Signed)
Her liver tests are minimally elevated and most likely from a fatty liver, which is common when someone is overweight.   There are several other causes of mildly elevated liver tests.    We can repeat the liver tests and see if they are still elevated.  (hepatic function)

## 2018-06-29 ENCOUNTER — Ambulatory Visit
Admission: RE | Admit: 2018-06-29 | Discharge: 2018-06-29 | Disposition: A | Discharge status: Home / Self Care | Payer: Medicare Other | Source: Ambulatory Visit | Attending: Internal Medicine | Admitting: Internal Medicine

## 2018-06-29 ENCOUNTER — Other Ambulatory Visit: Payer: Self-pay

## 2018-06-29 DIAGNOSIS — Z1231 Encounter for screening mammogram for malignant neoplasm of breast: Secondary | ICD-10-CM

## 2018-07-13 ENCOUNTER — Ambulatory Visit: Payer: Self-pay | Admitting: *Deleted

## 2018-07-13 NOTE — Telephone Encounter (Signed)
Message from Rayann Heman sent at 07/13/2018 7:15 AM EDT   Summary: Swollen Finger    Pt called and left a voicemail that stated that she has a swollen finger on her left hand next to her index finger. Pt would like a call back on what she should do. Pt states that this is hers girlfriends number         Attempted to contact pt regarding symptoms; left message on voicemail [phone]; will route to office for final disposition; normally seen by Dr Billey Gosling, [facility].

## 2018-07-13 NOTE — Telephone Encounter (Signed)
Would patient need to do a virtual visit or what would you suggest?

## 2018-07-13 NOTE — Telephone Encounter (Signed)
LVM suggest virtual visit with Dr. Quay Burow. Told her to call back and let us know.

## 2018-07-20 ENCOUNTER — Ambulatory Visit: Payer: Medicare Other

## 2018-08-11 ENCOUNTER — Other Ambulatory Visit: Payer: Self-pay | Admitting: Internal Medicine

## 2018-08-11 NOTE — Telephone Encounter (Signed)
Last OV 06/15/18 Next OV 12/16/18 Last RF was 07/14/18

## 2018-08-13 ENCOUNTER — Other Ambulatory Visit: Payer: Self-pay | Admitting: Internal Medicine

## 2018-08-16 NOTE — Telephone Encounter (Signed)
Last routine OV was 06/15/18 Next OV 12/16/18 Last RF 07/14/18

## 2018-10-13 DIAGNOSIS — Z20828 Contact with and (suspected) exposure to other viral communicable diseases: Secondary | ICD-10-CM | POA: Diagnosis not present

## 2018-10-14 ENCOUNTER — Other Ambulatory Visit: Payer: Self-pay | Admitting: Internal Medicine

## 2018-10-14 NOTE — Telephone Encounter (Signed)
Last refill 09/10/18 Last OV 06/15/18 Next OV 12/16/18

## 2018-11-10 ENCOUNTER — Other Ambulatory Visit: Payer: Self-pay | Admitting: Internal Medicine

## 2018-11-11 NOTE — Telephone Encounter (Signed)
Check [place] registry last filled 10/15/2018.Marland KitchenJohny Chess

## 2018-11-17 IMAGING — MR MR KNEE*L* W/O CM
6 of 7 series · 29 of 40 positions shown · non-contrast
Comparison: X-ray left lower leg 03/25/2017

CLINICAL DATA: Left knee pain since falling on ice 1 month ago.

EXAM:
MRI OF THE LEFT KNEE WITHOUT CONTRAST
TECHNIQUE: Multiplanar, multisequence MR imaging of the knee was performed. No
intravenous contrast was administered.

[Series 3: PD fat-sat · axial · 4.0mm · 0.50mm/px · z∈[-67,+41]mm · 6 of 25 slices shown (1 of 4)]
[im 1/25]
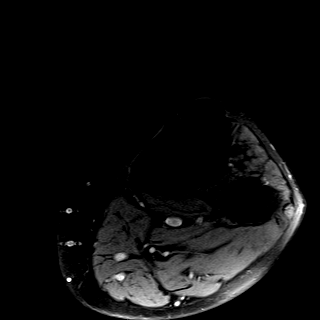
[im 5/25]
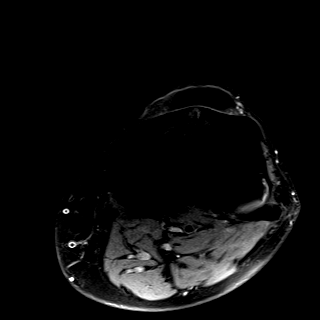
[im 10/25]
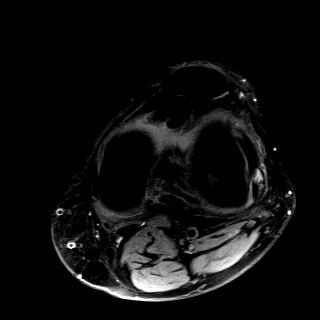
[im 15/25]
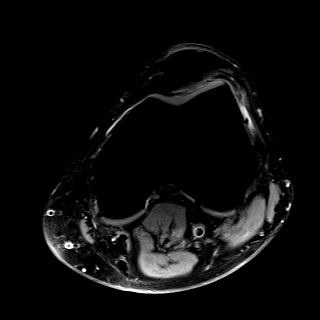
[im 20/25]
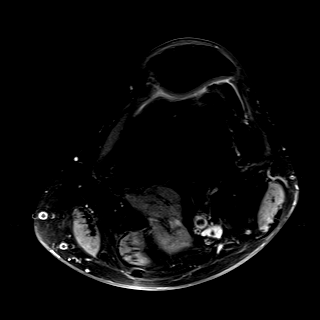
[im 25/25]
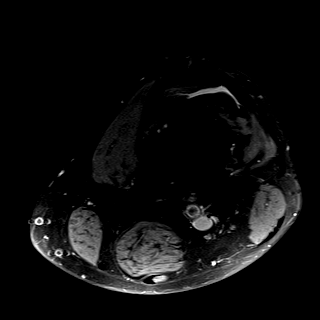

[Series 4: T1 · coronal · 3.5mm · 0.25mm/px · 1 of 22 slices shown]
[im 1/22]
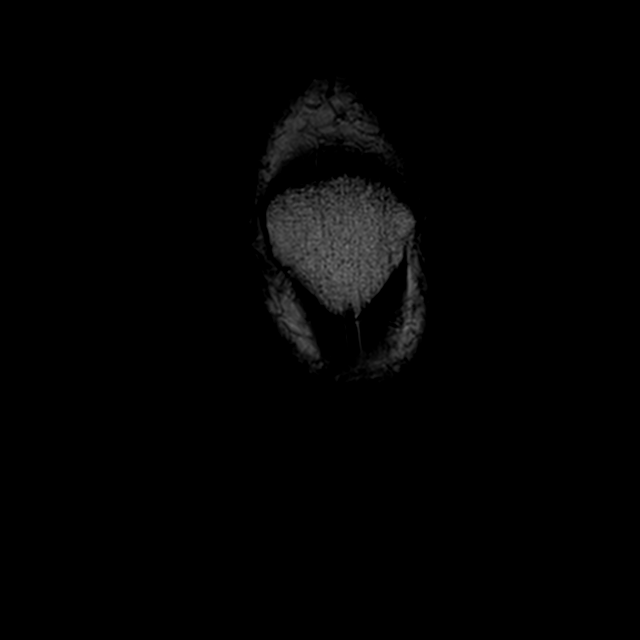

[Series 5: PD fat-sat · sagittal · 3.5mm · 0.31mm/px · 7 of 27 slices shown (2 of 4)]
[im 1/27]
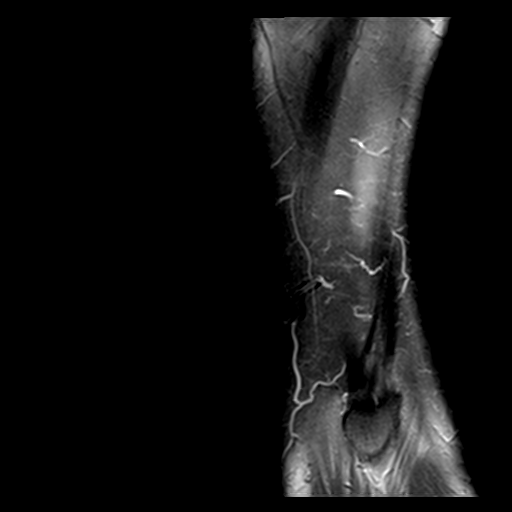
[im 5/27]
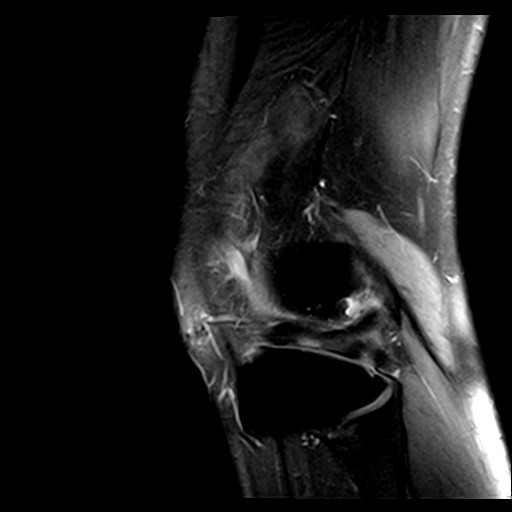
[im 9/27]
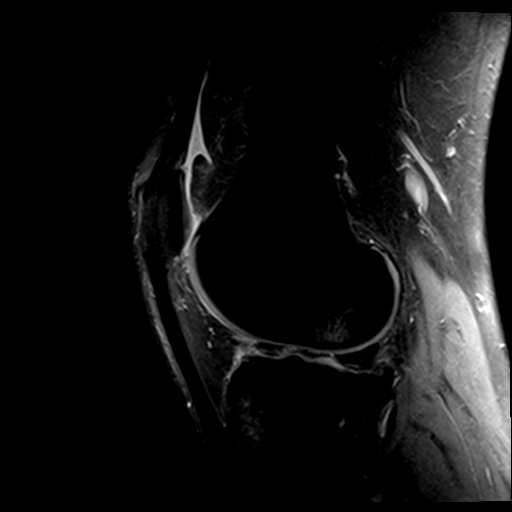
[im 14/27]
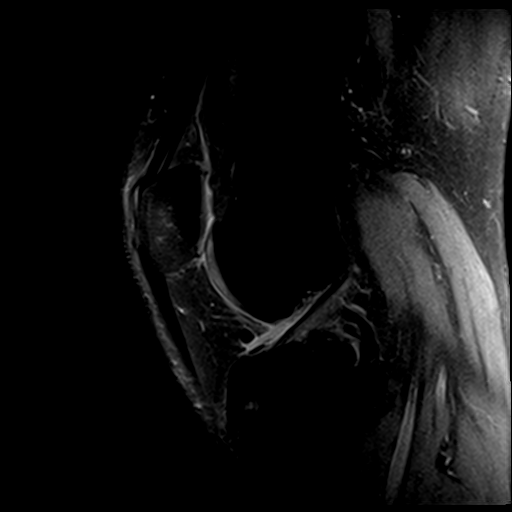
[im 18/27]
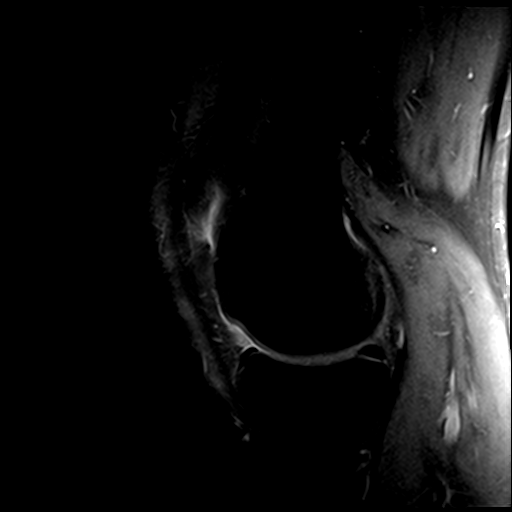
[im 22/27]
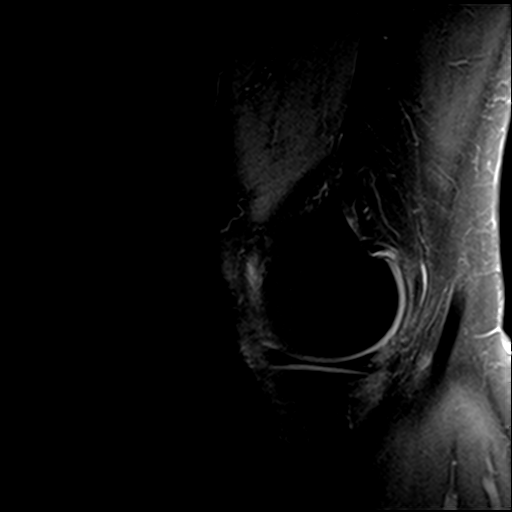
[im 27/27]
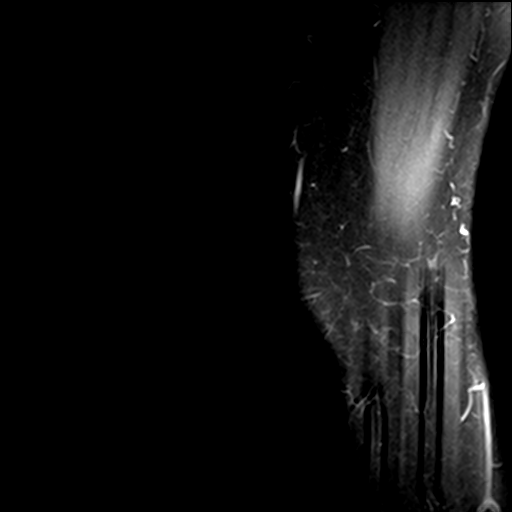

[Series 7: PD fat-sat · coronal · 3.5mm · 0.50mm/px · 6 of 22 slices shown (3 of 4)]
[im 1/22]
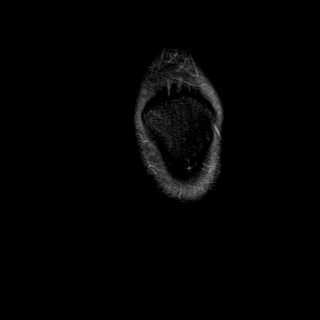
[im 5/22]
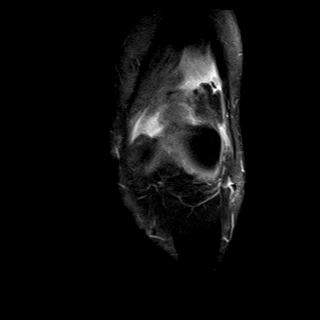
[im 9/22]
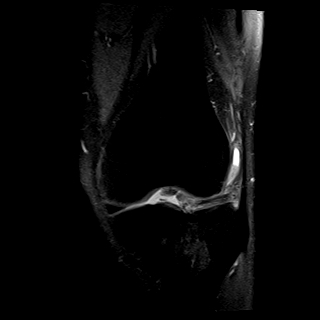
[im 13/22]
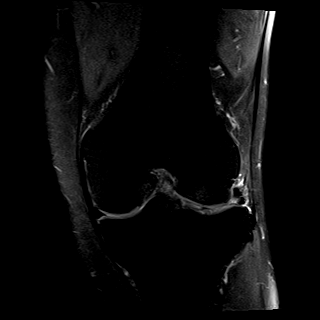
[im 17/22]
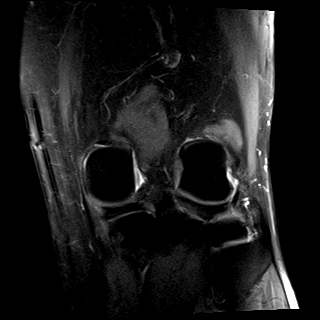
[im 22/22]
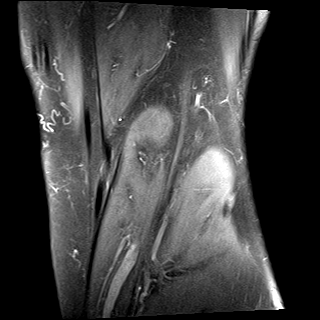

[Series 10: T2 fat-sat · coronal · 3.5mm · 0.62mm/px · 6 of 22 slices shown]
[im 1/22]
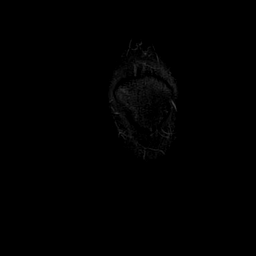
[im 5/22]
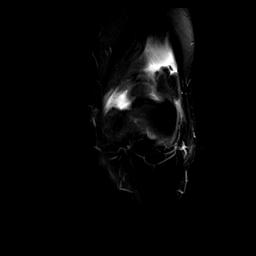
[im 9/22]
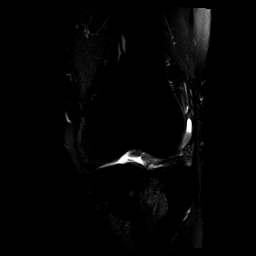
[im 13/22]
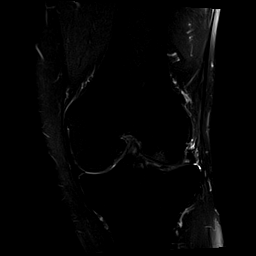
[im 17/22]
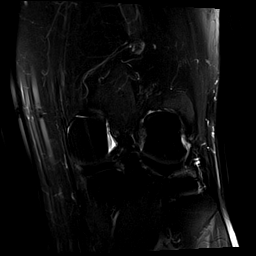
[im 22/22]
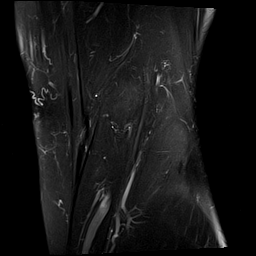

[Series 11: PD fat-sat · oblique · 3.0mm · 0.29mm/px · 3 of 11 slices shown (4 of 4)]
[im 1/11]
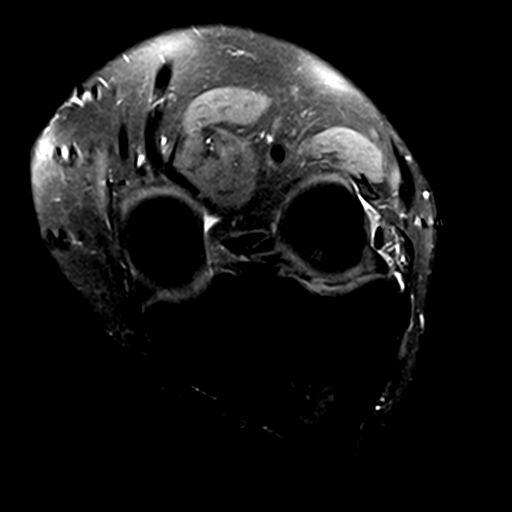
[im 6/11]
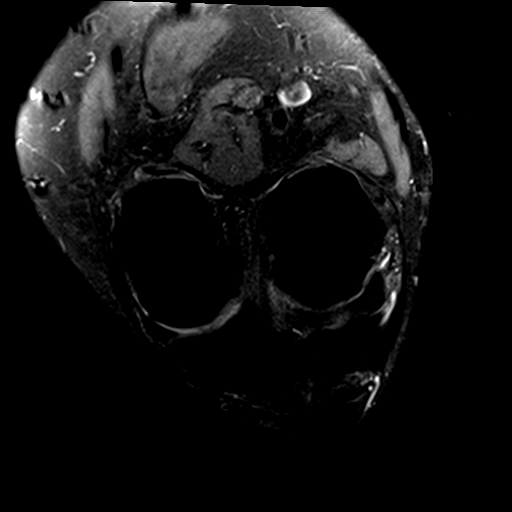
[im 11/11]
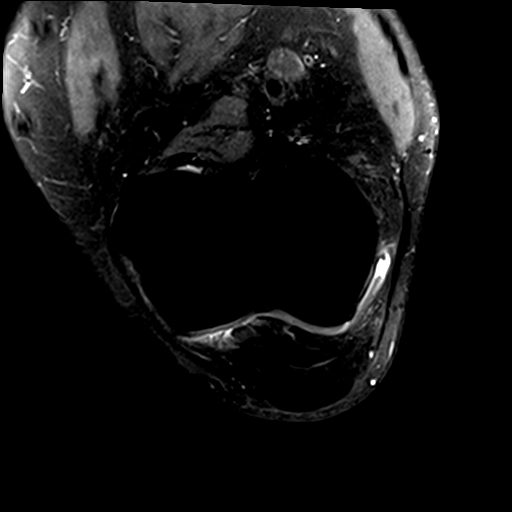

[29 of 40 positions shown; findings below may reference images not displayed]

FINDINGS: MENISCI

Medial meniscus:  Intact.

Lateral meniscus: Complex tear of the anterior horn-body of the
lateral meniscus. Radial tear of the free edge of the body of the
lateral meniscus.

LIGAMENTS

Cruciates:  Intact ACL and PCL.

Collaterals: Medial collateral ligament is intact. Lateral
collateral ligament complex is intact.

CARTILAGE

Patellofemoral: Mild partial-thickness cartilage loss of the
periphery of the medial patellar facet.

Medial:  No chondral defect.

Lateral: Partial-thickness cartilage loss of lateral tibial plateau
and lateral femoral condyle with mild subchondral reactive marrow
edema in the lateral femoral condyle.

Joint: No joint effusion. Normal Hoffa's fat. No plical thickening.

Popliteal Fossa:  No Baker's cyst.  Intact popliteus tendon.

Extensor Mechanism: Intact quadriceps tendon. Intact patellar
tendon. Intact medial patellar retinaculum. Intact lateral patellar
retinaculum. Intact MPFL.

Bones: Focal marrow edema in the medial aspect of the patella with a
small area of cortical discontinuity concerning for a nondisplaced
fracture (image 8/series 3). Posttraumatic changes with a healed
lateral tibial plateau fracture. Marrow edema in anterior lateral
tibial metaphysis adjacent to the tibial tubercle likely reflecting
an osseous contusion day subtle linear component (image 18/series 5)
concerning for a nondisplaced fracture.

Other: Muscles are normal.  No muscle atrophy or muscle edema.
IMPRESSION: 1. Focal marrow edema in the medial aspect of the patella with a
small area of cortical discontinuity concerning for a nondisplaced
fracture (image 8/series 3).
2. Marrow edema in anterior lateral tibial metaphysis adjacent to
the tibial tubercle likely reflecting an osseous contusion day
subtle linear component (image 18/series 5) concerning for a
nondisplaced fracture.
3. Complex tear of the anterior horn-body of the lateral meniscus.
Radial tear of the free edge of the body of the lateral meniscus.
4. Mild osteoarthritis of the lateral femorotibial compartment.

## 2018-12-11 NOTE — Patient Instructions (Addendum)
  Tests ordered today. Your results will be released to [facility] (or called to you) after review.  If any changes need to be made, you will be notified at that same time.  All other Health Maintenance issues reviewed.   All recommended immunizations and age-appropriate screenings are up-to-date or discussed.  Flu  immunization and B12 injection administered today.   Medications reviewed and updated.  Changes include :   none  Your prescription(s) have been submitted to your pharmacy. Please take as directed and contact our office if you believe you are having problem(s) with the medication(s).  A referral was ordered for surgery.  Please followup in 6 months

## 2018-12-11 NOTE — Progress Notes (Signed)
Subjective:    Patient ID: [name], female    DOB: 28-Apr-1955, 63 y.o.   MRN: [id number]  HPI The patient is here for follow up.  She is exercising regularly - walking at least two miles daily.     Diabetes: She is controlling with sugars with diet. She as been less compliant with a diabetic diet. She has not checked her sugars at home recently.  She has numbness/tingling in her feet but denies foot lesions. She does check her feet daily.  She is not up-to-date with an ophthalmology examination.   Peripheral neuropathy:  She takes gabapentin TID.  Her pain is controlled.   Hypertension: She is taking her medication daily. She is compliant with a low sodium diet.  She denies chest pain, palpitations, edema, shortness of breath and regular headaches.    Anxiety: She is taking the alprazolam twice daily as needed.  She has been taking it more often recently.  She denies any side effects from the medication. She feels her anxiety is well controlled and she is happy with her current dose of medication.   B12 deficiency:  She is due an injection today.   She is taking oral B12 orally because she was not able to come in for injections.    Peristomal hernia:  She feels like she needs to have it taken care of.  She has more discomfort and can not sleep on her left side.  She has had surgery in the past, but it recurred.     Medications and allergies reviewed with patient and updated if appropriate.  Patient Active Problem List   Diagnosis Date Noted  . B12 deficiency 06/15/2016  . Anxiety 06/12/2016  . Type 2 diabetes mellitus with diabetic autonomic neuropathy, without long-term current use of insulin ([place]) 01/10/2016  . Colon perforation ([place]) 01/10/2015  . Multiple falls 01/19/2014  . Colostomy malfunction ([place]) 12/09/2012  . Peristomal hernia 04/19/2012  . Seborrheic dermatitis 08/09/2010  . PERSONAL HX RECTAL CANCER 06/05/2010  . Tobacco abuse 04/16/2010  . ALLERGIC  RHINITIS 04/16/2010  . Peripheral neuropathy ([place]) 12/06/2009  . Essential hypertension 12/06/2009  . Diarrhea 12/06/2009    Current Outpatient Medications on File Prior to Visit  Medication Sig Dispense Refill  . blood glucose meter kit and supplies KIT Dispense based on patient and insurance preference. Use up to four times daily as directed. (FOR E11.9). 1 each 0  . fenofibrate (TRICOR) 145 MG tablet Take 1 tablet (145 mg total) by mouth daily. 30 tablet 5  . gabapentin (NEURONTIN) 600 MG tablet Take 2 tablets (1,200 mg total) by mouth 3 (three) times daily. 540 tablet 1  . hydrochlorothiazide (HYDRODIURIL) 25 MG tablet Take 1 tablet (25 mg total) by mouth daily. 90 tablet 1  . lisinopril (PRINIVIL,ZESTRIL) 10 MG tablet Take 1 tablet (10 mg total) by mouth daily. 90 tablet 3  . naproxen sodium (ANAPROX) 220 MG tablet Take 440 mg by mouth 2 (two) times daily as needed (pain).     No current facility-administered medications on file prior to visit.     Past Medical History:  Diagnosis Date  . Adenomatous colon polyp   . Anemia, unspecified   . Cocaine abuse ([place])    as recently as 10/21/11  . Depression   . Difficulty sleeping    takes meds to sleep  . Diverticulosis   . Hernia 03/18/2013  . History of transfusion   . Hypertension   . Incontinence of  bowel    since colostomy  . Knee fracture, left   . 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 Nonspecific colitis 10/10/11  . Radiation proctitis    mild  . 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 Tibial plateau fracture, left 01/19/2014    Past Surgical History:  Procedure Laterality Date  . APPENDECTOMY N/A 01/09/2015   Procedure: APPENDECTOMY;  Surgeon: Excell Seltzer, MD;  [facility];  Service: General;  Laterality: N/A;  . COLOSTOMY  2013   [facility]  . FINGER FRACTURE SURGERY Left    and hand surgery  . HERNIA REPAIR    . LAPAROSCOPIC PARASTOMAL HERNIA N/A 05/12/2013    Procedure: LAPAROSCOPIC PARASTOMAL HERNIA;  Surgeon: Leighton Ruff, MD;  [facility];  Service: General;  Laterality: N/A;  . LAPAROTOMY N/A 01/09/2015   Procedure: EXPLORATORY LAPAROTOMY;  Surgeon: Excell Seltzer, MD;  [facility];  Service: General;  Laterality: N/A;  RESECTION OF COLOSTOMY, CREATION OF NEW COLOSTOMY, VENTRAL HERNIA REPAIR WITH BIOLOGIC MESH,  LYSIS OF ADHESIONS , OMENTECTOMY  . LOW ANTERIOR BOWEL RESECTION  03/07/09  . MOUTH SURGERY  10/2009  . PORTACATH PLACEMENT     and removal    Social History   Socioeconomic History  . Marital status: Significant Other    Spouse name: Not on file  . Number of children: 0  . Years of education: Not on file  . Highest education level: Not on file  Occupational History  . Occupation: disabiled  Social Needs  . Financial resource strain: Not hard at all  . Food insecurity    Worry: Never true    Inability: Never true  . Transportation needs    Medical: No    Non-medical: No  Tobacco Use  . Smoking status: Current Every Day Smoker    Packs/day: 0.25    Years: 40.00    Pack years: 10.00    Types: Cigarettes  . Smokeless tobacco: Never Used  . Tobacco comment: Pt. in project free program, motivated to stop smoking completely. Declined smoking cessation cone program.  Substance and Sexual Activity  . Alcohol use: Yes    Alcohol/week: 12.0 standard drinks    Types: 12 Standard drinks or equivalent per week    Comment: occasional  driink  . Drug use: Yes    Types: Marijuana, Cocaine    Comment: no longer using Marijuana; last use of cocaine nov 2014   . Sexual activity: Not on file  Lifestyle  . Physical activity    Days per week: 7 days    Minutes per session: 20 min  . Stress: Only a little  Relationships  . Social connections    Talks on phone: More than three times a week    Gets together: More than three times a week    Attends religious service: More than 4 times per year    Active member of club  or organization: Yes    Attends meetings of clubs or organizations: More than 4 times per year    Relationship status: Divorced  Other Topics Concern  . 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 Colon cancer Mother   . Diabetes Father   . Cirrhosis Brother   . Stomach cancer Sister   . Colon polyps Sister   . Pulmonary Hypertension Brother     Review of Systems  Constitutional: Negative for chills and fever.  Respiratory: Negative for cough, shortness of breath and wheezing.   Cardiovascular: Negative for chest pain, palpitations and leg swelling.  Gastrointestinal: Positive for abdominal pain. Negative for blood in stool, constipation and diarrhea (stools are loose).  Neurological: Negative for light-headedness and headaches.       Objective:   Vitals:   12/13/18 0740  BP: (!) 144/78  Pulse: 69  Resp: 16  Temp: 98.8 F (37.1 C)  SpO2: 97%   BP Readings from Last 3 Encounters:  12/13/18 (!) 144/78  06/15/18 124/78  04/20/18 (!) 160/80   Wt Readings from Last 3 Encounters:  12/13/18 171 lb (77.6 kg)  06/15/18 172 lb 12.8 oz (78.4 kg)  04/20/18 175 lb (79.4 kg)   Body mass index is 27.6 kg/m.   Physical Exam    Constitutional: Appears well-developed and well-nourished. No distress.  HENT:  Head: Normocephalic and atraumatic.  Neck: Neck supple. No tracheal deviation present. No thyromegaly present.  No cervical lymphadenopathy Cardiovascular: Normal rate, regular rhythm and normal heart sounds.   No murmur heard. No carotid bruit .  No edema  Pulmonary/Chest: Effort normal and breath sounds normal. No respiratory distress. No has no wheezes. No rales.  Skin: Skin is warm and dry. Not diaphoretic.  Psychiatric: Normal mood and affect. Behavior is normal.    Diabetic Foot Exam - Simple   Simple Foot Form Diabetic Foot exam was performed with the following findings: Yes 12/13/2018  8:10 AM  Visual Inspection No deformities, no ulcerations, no other skin breakdown bilaterally: Yes Sensation Testing See comments: Yes Pulse Check Posterior Tibialis and Dorsalis pulse intact bilaterally: Yes Comments Decreased sensation b/l feet due to neuropathy      Assessment & Plan:    See Problem List for Assessment and Plan of chronic medical problems.

## 2018-12-13 ENCOUNTER — Other Ambulatory Visit (INDEPENDENT_AMBULATORY_CARE_PROVIDER_SITE_OTHER): Payer: Medicare Other

## 2018-12-13 ENCOUNTER — Ambulatory Visit (INDEPENDENT_AMBULATORY_CARE_PROVIDER_SITE_OTHER): Payer: Medicare Other | Admitting: Internal Medicine

## 2018-12-13 ENCOUNTER — Other Ambulatory Visit: Payer: Self-pay

## 2018-12-13 ENCOUNTER — Encounter: Payer: Self-pay | Admitting: Internal Medicine

## 2018-12-13 VITALS — BP 144/78 | HR 69 | Temp 98.8°F | Resp 16 | Ht 66.0 in | Wt 171.0 lb

## 2018-12-13 DIAGNOSIS — G6289 Other specified polyneuropathies: Secondary | ICD-10-CM

## 2018-12-13 DIAGNOSIS — F419 Anxiety disorder, unspecified: Secondary | ICD-10-CM

## 2018-12-13 DIAGNOSIS — E538 Deficiency of other specified B group vitamins: Secondary | ICD-10-CM | POA: Diagnosis not present

## 2018-12-13 DIAGNOSIS — Z23 Encounter for immunization: Secondary | ICD-10-CM

## 2018-12-13 DIAGNOSIS — E1143 Type 2 diabetes mellitus with diabetic autonomic (poly)neuropathy: Secondary | ICD-10-CM | POA: Diagnosis not present

## 2018-12-13 DIAGNOSIS — I10 Essential (primary) hypertension: Secondary | ICD-10-CM

## 2018-12-13 DIAGNOSIS — Z933 Colostomy status: Secondary | ICD-10-CM | POA: Insufficient documentation

## 2018-12-13 DIAGNOSIS — K469 Unspecified abdominal hernia without obstruction or gangrene: Secondary | ICD-10-CM | POA: Diagnosis not present

## 2018-12-13 DIAGNOSIS — Z720 Tobacco use: Secondary | ICD-10-CM | POA: Diagnosis not present

## 2018-12-13 LAB — COMPREHENSIVE METABOLIC PANEL
ALT: 26 U/L (ref 0–35)
AST: 23 U/L (ref 0–37)
Albumin: 4.1 g/dL (ref 3.5–5.2)
Alkaline Phosphatase: 97 U/L (ref 39–117)
BUN: 21 mg/dL (ref 6–23)
CO2: 20 mEq/L (ref 19–32)
Calcium: 9.6 mg/dL (ref 8.4–10.5)
Chloride: 105 mEq/L (ref 96–112)
Creatinine, Ser: 0.89 mg/dL (ref 0.40–1.20)
GFR: 77.63 mL/min (ref >60.00)
Glucose, Bld: 122 mg/dL — ABNORMAL HIGH (ref 70–99)
Potassium: 4.5 mEq/L (ref 3.5–5.1)
Sodium: 136 mEq/L (ref 135–145)
Total Bilirubin: 0.4 mg/dL (ref 0.2–1.2)
Total Protein: 7 g/dL (ref 6.0–8.3)

## 2018-12-13 LAB — LDL CHOLESTEROL, DIRECT: Direct LDL: 51 mg/dL

## 2018-12-13 LAB — LIPID PANEL
Cholesterol: 234 mg/dL — ABNORMAL HIGH (ref 0–200)
HDL: 37.3 mg/dL — ABNORMAL LOW (ref >39.00)
Total CHOL/HDL Ratio: 6
Triglycerides: 1103 mg/dL — ABNORMAL HIGH (ref 0.0–149.0)

## 2018-12-13 LAB — HEMOGLOBIN A1C: Hgb A1c MFr Bld: 6.8 % — ABNORMAL HIGH (ref 4.6–6.5)

## 2018-12-13 MED ORDER — CYANOCOBALAMIN 1000 MCG/ML IJ SOLN
1000.0000 ug | Freq: Once | INTRAMUSCULAR | Status: AC
  Administered 2018-12-13: 08:00:00 1000 ug via INTRAMUSCULAR

## 2018-12-13 MED ORDER — ALPRAZOLAM 0.5 MG PO TABS: ORAL_TABLET | ORAL | 0 refills | Status: DC

## 2018-12-13 NOTE — Assessment & Plan Note (Signed)
BP slightly elevated today, but earlier this year it was 124/78 Continue current medication and monitor cmp

## 2018-12-13 NOTE — Assessment & Plan Note (Addendum)
Controlled with gabapentin TID  Continue current dose and checking feet daily

## 2018-12-13 NOTE — Assessment & Plan Note (Signed)
Controlled, stable Continue current dose of medication  - xanax bid prn - advised the less she takes it the better.  She understands it is addicting and less effective when taken more

## 2018-12-13 NOTE — Assessment & Plan Note (Signed)
Diet controlled Check a1c Stressed diabetic diet and regular exercise

## 2018-12-13 NOTE — Assessment & Plan Note (Signed)
Stressed smoking cessation She has quit in the past for 4 months, but restarted during covid Stressed quitting again - has used patches in the past and can retry those

## 2018-12-13 NOTE — Assessment & Plan Note (Signed)
Last B12 level march 2020 - 144 B12 injection today  Monthly B12 injections

## 2018-12-13 NOTE — Assessment & Plan Note (Signed)
Increased pain Will refer to surgery to consider repair

## 2018-12-15 MED ORDER — FENOFIBRATE 145 MG PO TABS: 145.0000 mg | ORAL_TABLET | Freq: Every day | ORAL | 5 refills | Status: DC

## 2018-12-16 ENCOUNTER — Ambulatory Visit: Payer: Medicare Other | Admitting: Internal Medicine

## 2019-01-11 ENCOUNTER — Other Ambulatory Visit: Payer: Self-pay | Admitting: Internal Medicine

## 2019-01-12 NOTE — Telephone Encounter (Signed)
Check [place] registry last filled 12/13/2018.[name]/LMB

## 2019-01-31 DIAGNOSIS — K432 Incisional hernia without obstruction or gangrene: Secondary | ICD-10-CM | POA: Diagnosis not present

## 2019-02-03 ENCOUNTER — Other Ambulatory Visit: Payer: Self-pay | Admitting: General Surgery

## 2019-02-03 DIAGNOSIS — K432 Incisional hernia without obstruction or gangrene: Secondary | ICD-10-CM

## 2019-03-08 ENCOUNTER — Other Ambulatory Visit: Payer: Self-pay | Admitting: Internal Medicine

## 2019-03-08 NOTE — Telephone Encounter (Signed)
[place] Controlled Database Checked Last filled: 12/13/18 # 60 LOV w/you: 12/13/18 Next appt w/you: 06/13/19

## 2019-04-27 ENCOUNTER — Ambulatory Visit: Payer: Medicare Other

## 2019-06-07 ENCOUNTER — Telehealth: Payer: Self-pay

## 2019-06-07 NOTE — Telephone Encounter (Signed)
Please advise. We are full tomorrow.

## 2019-06-07 NOTE — Telephone Encounter (Signed)
At this point she does not need to keep the discharge for testing, but should look at it to see if it stool, mucus or blood.  Should schedule an appointment for next available.

## 2019-06-07 NOTE — Telephone Encounter (Signed)
Patient calling and spoke with [facility] on  06/05/2019 11:41:29 AM States that d she has been having sx's of rectal discharge. Caller stated she has a colostomy bag. Caller is wanting to know what she should do and if she should get a sample. she has a colostomy bag but is having cramping like she needs to have BM and having rectal discharge starting 2 weeks ago. No fever. Has CT scan scheduled for Wed. Wants to know if she should keep discharge for testing. Doesn't want further triage

## 2019-06-08 ENCOUNTER — Ambulatory Visit
Admission: RE | Admit: 2019-06-08 | Discharge: 2019-06-08 | Disposition: A | Discharge status: Home / Self Care | Payer: Medicare Other | Source: Ambulatory Visit | Attending: General Surgery | Admitting: General Surgery

## 2019-06-08 ENCOUNTER — Other Ambulatory Visit: Payer: Self-pay

## 2019-06-08 DIAGNOSIS — K432 Incisional hernia without obstruction or gangrene: Secondary | ICD-10-CM

## 2019-06-08 DIAGNOSIS — K5730 Diverticulosis of large intestine without perforation or abscess without bleeding: Secondary | ICD-10-CM | POA: Diagnosis not present

## 2019-06-08 DIAGNOSIS — K439 Ventral hernia without obstruction or gangrene: Secondary | ICD-10-CM | POA: Diagnosis not present

## 2019-06-08 MED ORDER — IOPAMIDOL (ISOVUE-300) INJECTION 61%
100.0000 mL | Freq: Once | INTRAVENOUS | Status: AC | PRN
  Administered 2019-06-08: 100 mL via INTRAVENOUS

## 2019-06-08 NOTE — Telephone Encounter (Signed)
LVM for pt to call back in regards.  

## 2019-06-09 NOTE — Telephone Encounter (Signed)
Pt has an appointment on Monday and can discuss concerns further then. No pain or any emergent concern at this time.

## 2019-06-12 NOTE — Progress Notes (Signed)
Subjective:    Patient ID: [name], female    DOB: [birth year], 64 y.o.   MRN: [id number]  HPI The patient is here for follow up of their chronic medical problems, including diabetes, peripheral neuropathy, hypertension, anxiety, B12 def   She is taking all of her medications as prescribed, except the gabapentin she does not take regularly.   She is exercising regularly.   She walks.  She is not watching the sugars/carbs as much.    Peristomal hernia - she saw surgery and just had a Ct scan.  She will likely have surgery for this.       Medications and allergies reviewed with patient and updated if appropriate.  Patient Active Problem List   Diagnosis Date Noted  . Hypertriglyceridemia 06/13/2019  . 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place [facility]) 12/13/2018  . B12 deficiency 06/15/2016  . Anxiety 06/12/2016  . Type 2 diabetes mellitus with diabetic autonomic neuropathy, without long-term current use of insulin ([place]) 01/10/2016  . Peristomal hernia 04/19/2012  . Seborrheic dermatitis 08/09/2010  . PERSONAL HX RECTAL CANCER 06/05/2010  . Tobacco abuse 04/16/2010  . ALLERGIC RHINITIS 04/16/2010  . Peripheral neuropathy ([place]) 12/06/2009  . Essential hypertension 12/06/2009  . Diarrhea 12/06/2009    Current Outpatient Medications on File Prior to Visit  Medication Sig Dispense Refill  . ALPRAZolam (XANAX) 0.5 MG tablet TAKE 1 TABLET BY MOUTH DAILY AS NEEDED FOR ANXIETY 60 tablet 0  . blood glucose meter kit and supplies KIT Dispense based on patient and insurance preference. Use up to four times daily as directed. (FOR E11.9). 1 each 0  . fenofibrate (TRICOR) 145 MG tablet Take 1 tablet (145 mg total) by mouth daily. 30 tablet 5  . gabapentin (NEURONTIN) 600 MG tablet Take 2 tablets (1,200 mg total) by mouth 3 (three) times daily. 540 tablet 1  . hydrochlorothiazide (HYDRODIURIL) 25 MG tablet Take 1 tablet by mouth once daily 90 tablet 0  . lisinopril (PRINIVIL,ZESTRIL) 10 MG tablet Take 1  tablet (10 mg total) by mouth daily. 90 tablet 3  . naproxen sodium (ANAPROX) 220 MG tablet Take 440 mg by mouth 2 (two) times daily as needed (pain).     No current facility-administered medications on file prior to visit.    Past Medical History:  Diagnosis Date  . Adenomatous colon polyp   . Anemia, unspecified   . Cocaine abuse ([place])    as recently as 10/21/11  . Depression   . Difficulty sleeping    takes meds to sleep  . Diverticulosis   . Hernia 03/18/2013  . History of transfusion   . Hypertension   . Incontinence of bowel    since colostomy  . Knee fracture, left   . 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 Nonspecific colitis 10/10/11  . Radiation proctitis    mild  . 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 Tibial plateau fracture, left 01/19/2014    Past Surgical History:  Procedure Laterality Date  . APPENDECTOMY N/A 01/09/2015   Procedure: APPENDECTOMY;  Surgeon: Excell Seltzer, MD;  Location: [facility];  Service: General;  Laterality: N/A;  . COLOSTOMY  2013   [facility]  . FINGER FRACTURE SURGERY Left    and hand surgery  . HERNIA REPAIR    . LAPAROSCOPIC PARASTOMAL HERNIA N/A 05/12/2013   Procedure: LAPAROSCOPIC PARASTOMAL HERNIA;  Surgeon: Leighton Ruff, MD;  Location: [facility];  Service: General;  Laterality:  N/A;  . LAPAROTOMY N/A 01/09/2015   Procedure: EXPLORATORY LAPAROTOMY;  Surgeon: Excell Seltzer, MD;  Location: [facility];  Service: General;  Laterality: N/A;  RESECTION OF COLOSTOMY, CREATION OF NEW COLOSTOMY, VENTRAL HERNIA REPAIR WITH BIOLOGIC MESH,  LYSIS OF ADHESIONS , OMENTECTOMY  . LOW ANTERIOR BOWEL RESECTION  03/07/09  . MOUTH SURGERY  10/2009  . PORTACATH PLACEMENT     and removal    Social History   Socioeconomic History  . Marital status: Significant Other    Spouse name: Not on file  . Number of children: 0  . Years of education: Not on file  . Highest education level: Not on file  Occupational  History  . Occupation: disabiled  Tobacco Use  . Smoking status: Current Every Day Smoker    Packs/day: 0.25    Years: 40.00    Pack years: 10.00    Types: Cigarettes  . Smokeless tobacco: Never Used  . Tobacco comment: Pt. in project free program, motivated to stop smoking completely. Declined smoking cessation cone program.  Substance and Sexual Activity  . Alcohol use: Yes    Alcohol/week: 12.0 standard drinks    Types: 12 Standard drinks or equivalent per week    Comment: occasional  driink  . Drug use: Yes    Types: Marijuana, Cocaine    Comment: no longer using Marijuana; last use of cocaine nov 2014   . Sexual activity: Not on file  Other Topics Concern  . 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 Difficulty of Paying Living Expenses: Not on file  Food Insecurity:   . Worried About Charity fundraiser in the Last Year: Not on file  . Ran Out of Food in the Last Year: Not on file  Transportation Needs:   . Lack of Transportation (Medical): Not on file  . Lack of Transportation (Non-Medical): Not on file  Physical Activity:   . Days of Exercise per Week: Not on file  . Minutes of Exercise per Session: Not on file  Stress:   . Feeling of Stress : Not on file  Social Connections:   . Frequency of Communication with Friends and Family: Not on file  . Frequency of Social Gatherings with Friends and Family: Not on file  . Attends Religious Services: Not on file  . Active Member of Clubs or Organizations: Not on file  . Attends Archivist Meetings:  Not on file  . Marital Status: Not on file    Family History  Problem Relation Age of Onset  . Colon cancer Mother   . Diabetes Father   . Cirrhosis Brother   . Stomach cancer Sister   . Colon polyps Sister   . Pulmonary Hypertension Brother     Review of Systems  Constitutional: Negative for chills and fever.  Respiratory: Positive for cough (mild). Negative for shortness of breath and wheezing.   Cardiovascular: Negative for chest pain, palpitations and leg swelling.  Neurological: Negative for light-headedness and headaches.       Objective:   Vitals:  06/13/19 0744  BP: 124/68  Pulse: 74  Resp: 16  Temp: 98.6 F (37 C)  SpO2: 99%   BP Readings from Last 3 Encounters:  06/13/19 124/68  12/13/18 (!) 144/78  06/15/18 124/78   Wt Readings from Last 3 Encounters:  06/13/19 166 lb (75.3 kg)  12/13/18 171 lb (77.6 kg)  06/15/18 172 lb 12.8 oz (78.4 kg)   Body mass index is 26.79 kg/m.   Physical Exam    Constitutional: Appears well-developed and well-nourished. No distress.  HENT:  Head: Normocephalic and atraumatic.  Neck: Neck supple. No tracheal deviation present. No thyromegaly present.  No cervical lymphadenopathy Cardiovascular: Normal rate, regular rhythm and normal heart sounds.  No murmur heard. No carotid bruit .  No edema Pulmonary/Chest: Effort normal and breath sounds normal. No respiratory distress. No has no wheezes. No rales.  Skin: Skin is warm and dry. Not diaphoretic.  Psychiatric: Normal mood and affect. Behavior is normal.      Assessment & Plan:    See Problem List for Assessment and Plan of chronic medical problems.    This visit occurred during the SARS-CoV-2 public health emergency.  Safety protocols were in place, including screening questions prior to the visit, additional usage of staff PPE, and extensive cleaning of exam room while observing appropriate contact time as indicated for disinfecting solutions.

## 2019-06-12 NOTE — Patient Instructions (Addendum)
  Blood work was ordered.     Medications reviewed and updated.  Changes include :   Decrease gabapentin to 600 mg three times a day.    Your prescription(s) have been submitted to your pharmacy. Please take as directed and contact our office if you believe you are having problem(s) with the medication(s).    Please followup in 6 months

## 2019-06-13 ENCOUNTER — Other Ambulatory Visit: Payer: Self-pay

## 2019-06-13 ENCOUNTER — Ambulatory Visit (INDEPENDENT_AMBULATORY_CARE_PROVIDER_SITE_OTHER): Payer: Medicare Other | Admitting: Internal Medicine

## 2019-06-13 ENCOUNTER — Encounter: Payer: Self-pay | Admitting: Internal Medicine

## 2019-06-13 VITALS — BP 124/68 | HR 74 | Temp 98.6°F | Resp 16 | Ht 66.0 in | Wt 166.0 lb

## 2019-06-13 DIAGNOSIS — E781 Pure hyperglyceridemia: Secondary | ICD-10-CM | POA: Insufficient documentation

## 2019-06-13 DIAGNOSIS — G6289 Other specified polyneuropathies: Secondary | ICD-10-CM | POA: Diagnosis not present

## 2019-06-13 DIAGNOSIS — Z720 Tobacco use: Secondary | ICD-10-CM | POA: Diagnosis not present

## 2019-06-13 DIAGNOSIS — F419 Anxiety disorder, unspecified: Secondary | ICD-10-CM

## 2019-06-13 DIAGNOSIS — E785 Hyperlipidemia, unspecified: Secondary | ICD-10-CM | POA: Insufficient documentation

## 2019-06-13 DIAGNOSIS — E538 Deficiency of other specified B group vitamins: Secondary | ICD-10-CM

## 2019-06-13 DIAGNOSIS — F17210 Nicotine dependence, cigarettes, uncomplicated: Secondary | ICD-10-CM | POA: Diagnosis not present

## 2019-06-13 DIAGNOSIS — E1143 Type 2 diabetes mellitus with diabetic autonomic (poly)neuropathy: Secondary | ICD-10-CM

## 2019-06-13 DIAGNOSIS — I10 Essential (primary) hypertension: Secondary | ICD-10-CM | POA: Diagnosis not present

## 2019-06-13 DIAGNOSIS — E1169 Type 2 diabetes mellitus with other specified complication: Secondary | ICD-10-CM | POA: Insufficient documentation

## 2019-06-13 DIAGNOSIS — I700 Atherosclerosis of aorta: Secondary | ICD-10-CM | POA: Insufficient documentation

## 2019-06-13 LAB — CBC WITH DIFFERENTIAL/PLATELET
Basophils Absolute: 0.1 10*3/uL (ref 0.0–0.1)
Basophils Relative: 0.6 % (ref 0.0–3.0)
Eosinophils Absolute: 0.1 10*3/uL (ref 0.0–0.7)
Eosinophils Relative: 0.8 % (ref 0.0–5.0)
HCT: 38.7 % (ref 36.0–46.0)
Hemoglobin: 12.7 g/dL (ref 12.0–15.0)
Lymphocytes Relative: 17.7 % (ref 12.0–46.0)
Lymphs Abs: 1.5 10*3/uL (ref 0.7–4.0)
MCHC: 32.9 g/dL (ref 30.0–36.0)
MCV: 96 fl (ref 78.0–100.0)
Monocytes Absolute: 0.4 10*3/uL (ref 0.1–1.0)
Monocytes Relative: 4.4 % (ref 3.0–12.0)
Neutro Abs: 6.4 10*3/uL (ref 1.4–7.7)
Neutrophils Relative %: 76.5 % (ref 43.0–77.0)
Platelets: 322 10*3/uL (ref 150.0–400.0)
RBC: 4.03 Mil/uL (ref 3.87–5.11)
RDW: 14.5 % (ref 11.5–15.5)
WBC: 8.4 10*3/uL (ref 4.0–10.5)

## 2019-06-13 LAB — LIPID PANEL
Cholesterol: 149 mg/dL (ref 0–200)
HDL: 40.2 mg/dL (ref >39.00)
LDL Cholesterol: 82 mg/dL (ref 0–99)
NonHDL: 108.91
Total CHOL/HDL Ratio: 4
Triglycerides: 135 mg/dL (ref 0.0–149.0)
VLDL: 27 mg/dL (ref 0.0–40.0)

## 2019-06-13 LAB — COMPREHENSIVE METABOLIC PANEL
ALT: 12 U/L (ref 0–35)
AST: 17 U/L (ref 0–37)
Albumin: 4.2 g/dL (ref 3.5–5.2)
Alkaline Phosphatase: 71 U/L (ref 39–117)
BUN: 25 mg/dL — ABNORMAL HIGH (ref 6–23)
CO2: 23 mEq/L (ref 19–32)
Calcium: 10 mg/dL (ref 8.4–10.5)
Chloride: 104 mEq/L (ref 96–112)
Creatinine, Ser: 0.93 mg/dL (ref 0.40–1.20)
GFR: 73.68 mL/min (ref >60.00)
Glucose, Bld: 143 mg/dL — ABNORMAL HIGH (ref 70–99)
Potassium: 4.2 mEq/L (ref 3.5–5.1)
Sodium: 138 mEq/L (ref 135–145)
Total Bilirubin: 0.5 mg/dL (ref 0.2–1.2)
Total Protein: 7.3 g/dL (ref 6.0–8.3)

## 2019-06-13 LAB — VITAMIN B12: Vitamin B-12: 1260 pg/mL — ABNORMAL HIGH (ref 211–911)

## 2019-06-13 LAB — HEMOGLOBIN A1C: Hgb A1c MFr Bld: 7.8 % — ABNORMAL HIGH (ref 4.6–6.5)

## 2019-06-13 MED ORDER — GABAPENTIN 600 MG PO TABS: 600.0000 mg | ORAL_TABLET | Freq: Three times a day (TID) | ORAL | 1 refills | Status: DC

## 2019-06-13 MED ORDER — ALPRAZOLAM 0.5 MG PO TABS: ORAL_TABLET | ORAL | 0 refills | Status: DC

## 2019-06-13 NOTE — Assessment & Plan Note (Signed)
Taking gabapentin but not daily -- the high dose makes her clumsy and she has fallen High dose gabapentin - causing her to be unsteady and she is not taking it regularly Decrease dose 600 mg to TID and take regularly

## 2019-06-13 NOTE — Assessment & Plan Note (Addendum)
Smoking cessation was discussed for more than 3 minutes.  The patient was counseled on the dangers of tobacco use, and was advised to quit and reluctant to quit.  Reviewed ways of quitting smoking including nicotine replacement, vapping/e-cigarettes, cold [place], weaning off cigarettes, and pharmacotherapy (wellbutrin and chantix).  She does want to quit, but is not 100% committed to quitting.  Declined referral to smoking cessation program

## 2019-06-13 NOTE — Assessment & Plan Note (Signed)
Chronic Controlled, stable Takes xanax as needed only Continue current dose of medication

## 2019-06-13 NOTE — Assessment & Plan Note (Signed)
Chronic Taking fenofibrate daily Check lipids, cmp Has 0-6 drinks a day - advised to decrease

## 2019-06-13 NOTE — Assessment & Plan Note (Signed)
Chronic Check a1c Low sugar / carb diet Continue regular exercise 

## 2019-06-13 NOTE — Assessment & Plan Note (Signed)
Chronic Taking B12 pill daily 1500 mcg Check level

## 2019-06-13 NOTE — Assessment & Plan Note (Signed)
Chronic BP well controlled Current regimen effective and well tolerated Continue current medications at current doses cmp  

## 2019-06-13 NOTE — Assessment & Plan Note (Signed)
LDL already at goal, but triglycerides high Check lipids Advised smoking cessation and decreased alcohol intake On fenofibrate

## 2019-06-15 ENCOUNTER — Other Ambulatory Visit: Payer: Self-pay | Admitting: Internal Medicine

## 2019-06-15 MED ORDER — FARXIGA 5 MG PO TABS: 5.0000 mg | ORAL_TABLET | Freq: Every day | ORAL | 5 refills | Status: DC

## 2019-06-17 ENCOUNTER — Telehealth: Payer: Self-pay | Admitting: Internal Medicine

## 2019-06-17 MED ORDER — METFORMIN HCL 500 MG PO TABS: 500.0000 mg | ORAL_TABLET | Freq: Two times a day (BID) | ORAL | 3 refills | Status: DC

## 2019-06-17 MED ORDER — ALPRAZOLAM 0.5 MG PO TABS: ORAL_TABLET | ORAL | 0 refills | Status: DC

## 2019-06-17 NOTE — Telephone Encounter (Signed)
Last RF 05/19/19 Last OV 06/13/19  I called pt to clarify the test strip message. She states that her farxiga is too expensive. Willing to take metformin to get sugars regulated.

## 2019-06-17 NOTE — Telephone Encounter (Signed)
New message:    Pt c/o medication issue:  1. Name of Medication:ALPRAZolam (XANAX) 0.5 MG tablet  2. What is your medication issue? Pt states she is at the pharmacy trying to get this medication and they are telling her it's too soon. The pt states she was taking 2 a day. Please advise.

## 2019-06-17 NOTE — Telephone Encounter (Signed)
Metformin sent to pharmacy

## 2019-06-17 NOTE — Telephone Encounter (Signed)
New Message:   Pt also states the strips to check her sugar are very expensive and was told to call us and let us know so something else can be sent over for her

## 2019-07-06 ENCOUNTER — Telehealth: Payer: Self-pay | Admitting: Internal Medicine

## 2019-07-06 NOTE — Telephone Encounter (Signed)
   Patient would like education on testing blood sugar and what numbers she should be seeing when monitoring. Her last blood sugars readings: 140, 115, 123, 127 Advised patient Metformin had been called to pharmacy 3/5  Please call

## 2019-07-06 NOTE — Telephone Encounter (Signed)
Well controlled fasting sugars are 100-150.  Her sugars look well controlled.

## 2019-07-06 NOTE — Telephone Encounter (Signed)
LVM for pt to call back in regards.  

## 2019-07-07 ENCOUNTER — Other Ambulatory Visit: Payer: Self-pay | Admitting: Internal Medicine

## 2019-07-07 DIAGNOSIS — Z1231 Encounter for screening mammogram for malignant neoplasm of breast: Secondary | ICD-10-CM

## 2019-07-07 NOTE — Telephone Encounter (Signed)
LVM letting pt know.  

## 2019-07-22 DIAGNOSIS — K432 Incisional hernia without obstruction or gangrene: Secondary | ICD-10-CM | POA: Diagnosis not present

## 2019-08-05 IMAGING — DX DG KNEE COMPLETE 4+V*L*
4 series · 4 of 4 positions shown · non-contrast
Comparison: Left knee series of January 16, 2014

CLINICAL DATA: Painful left knee after a fall.

EXAM:
LEFT KNEE - COMPLETE 4+ VIEW

[knee ap]
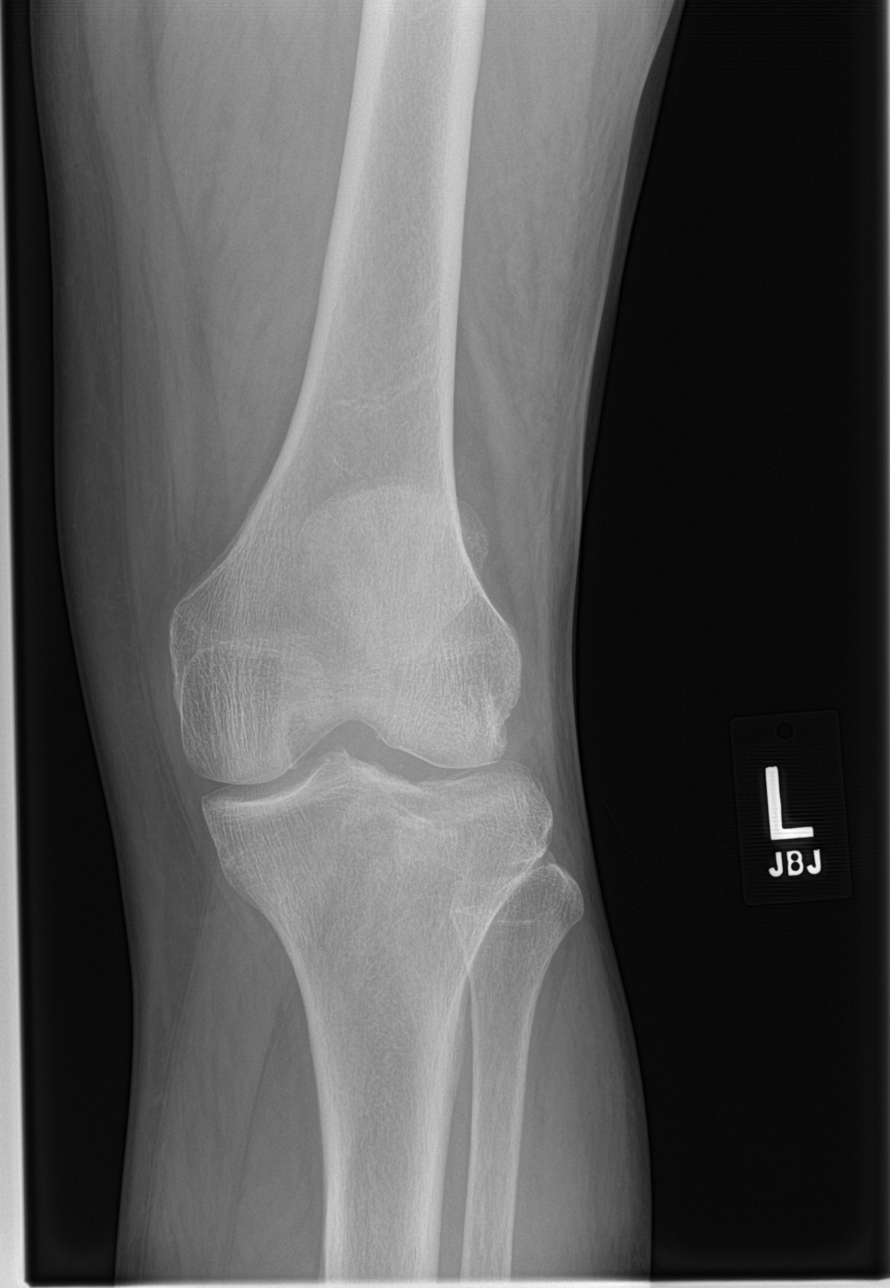

[knee lat]
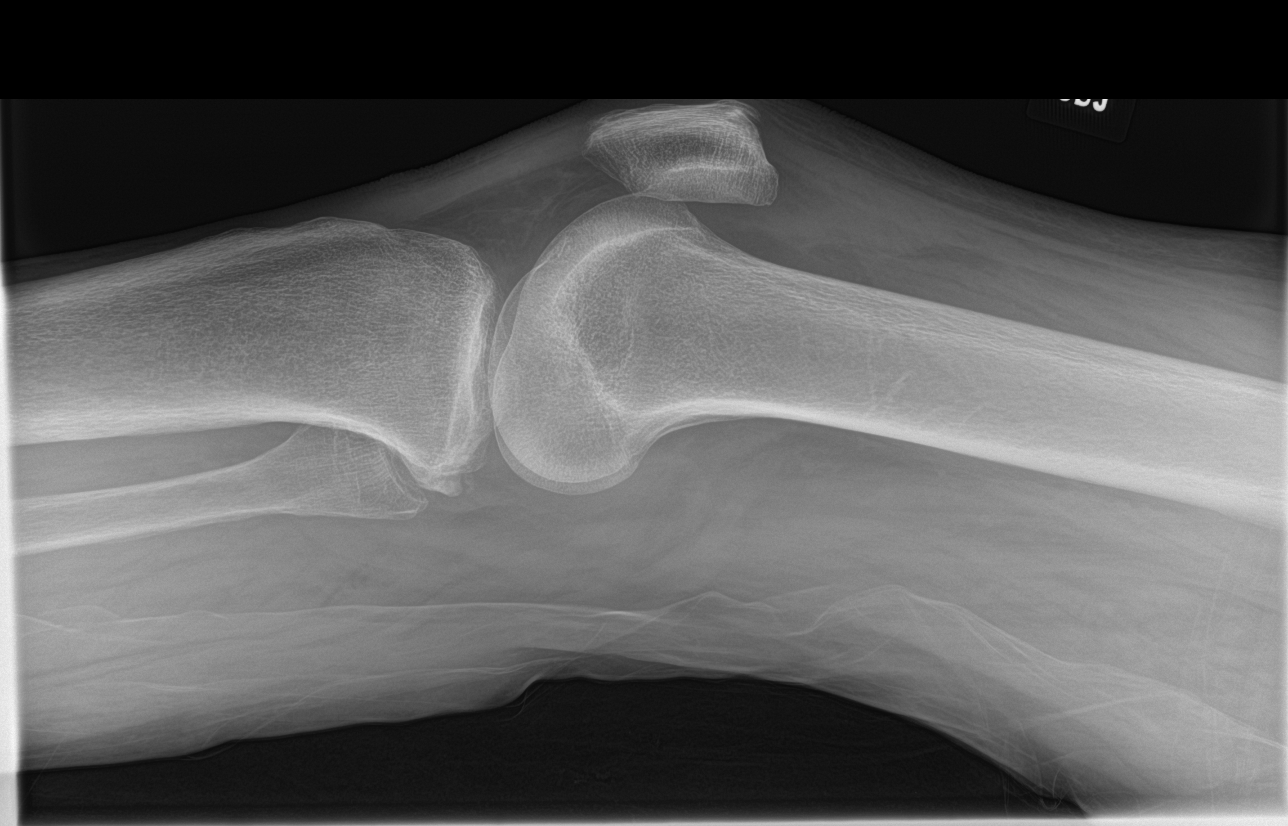

[knee obl (1 of 2)]
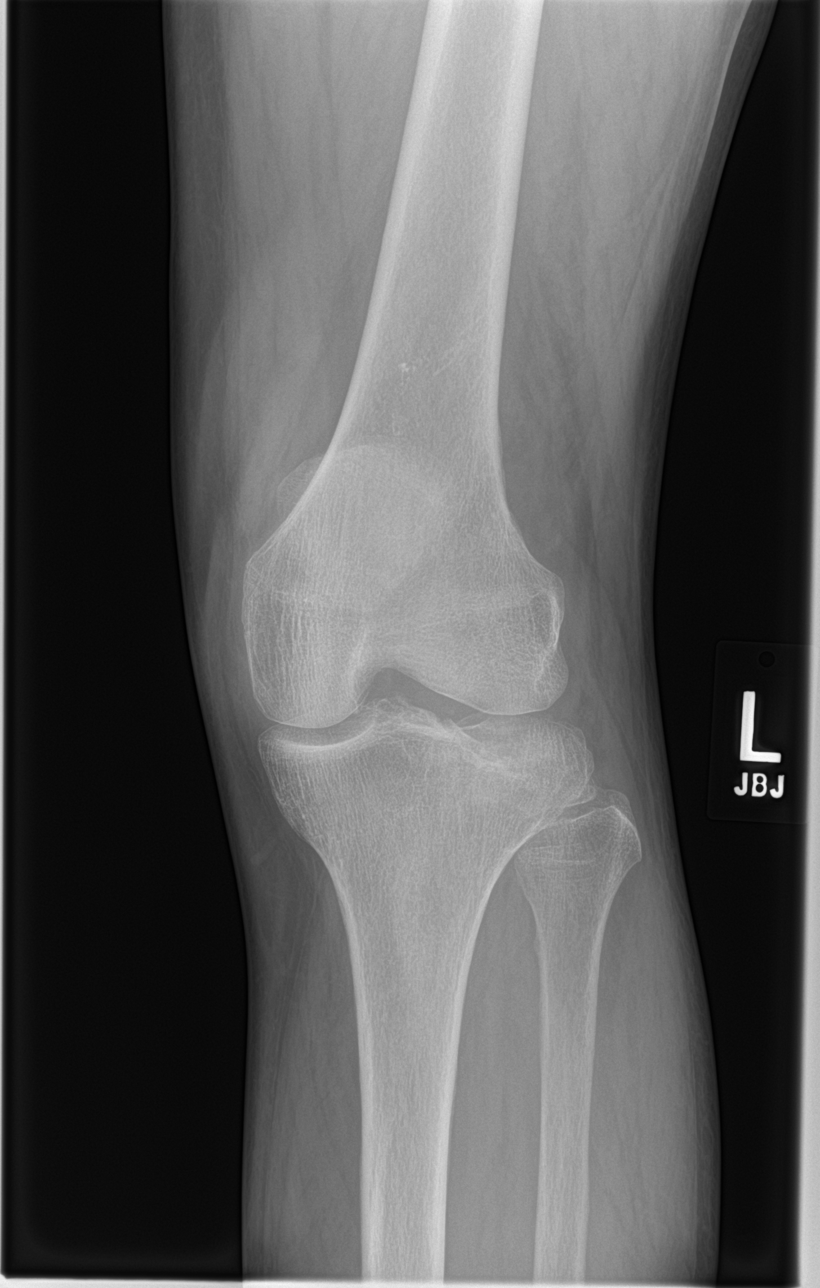

[knee obl (2 of 2)]
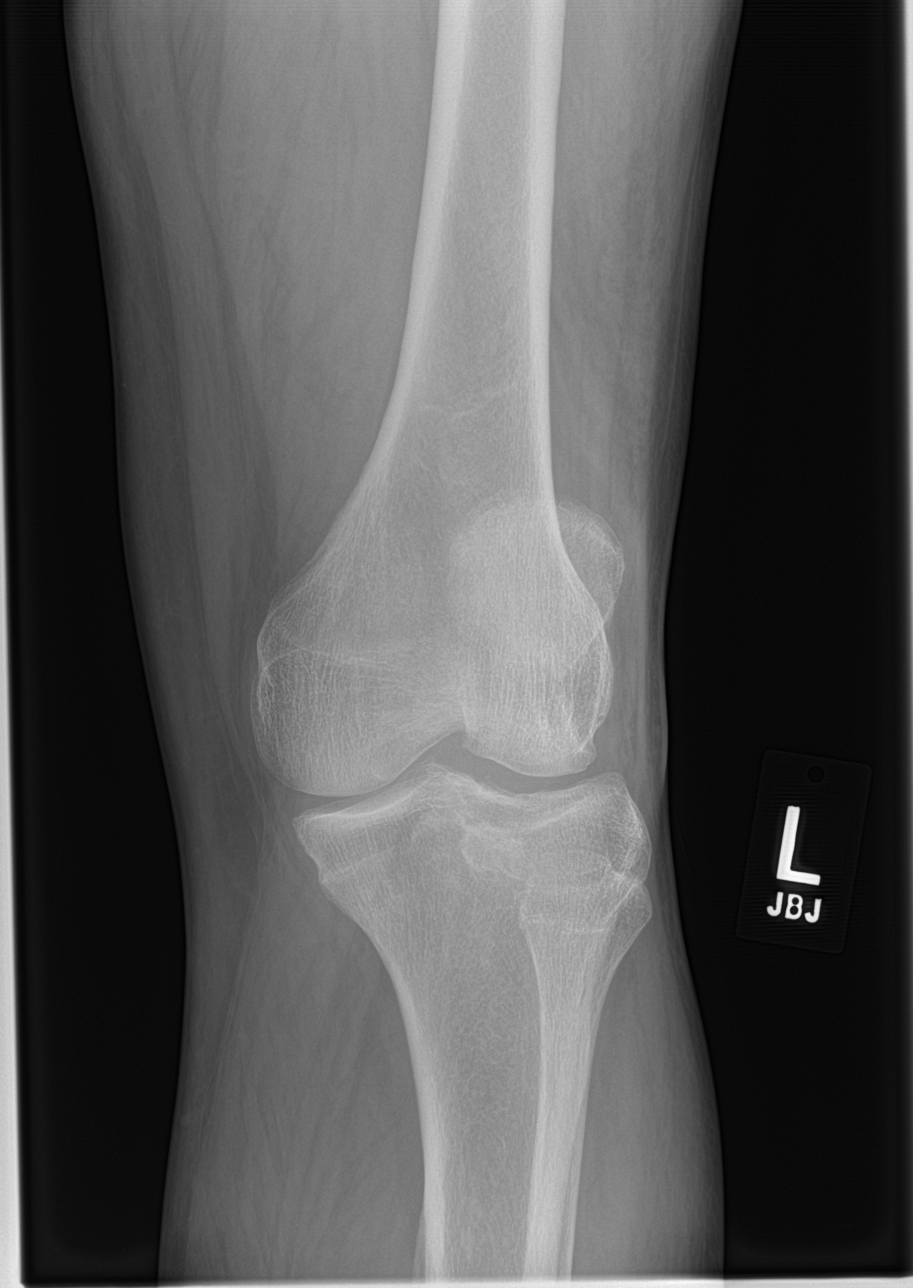

[4 of 4 positions shown; findings below may reference images not displayed]

FINDINGS: The bones are subjectively adequately mineralized. There is
depression of the lateral tibial plateau which appears unchanged
since the 5102 study. The medial tibial plateau is normal. The
proximal fibula is intact. The femoral condyles and the patella are
intact. There is no significant joint space loss. There is no
definite effusion.
IMPRESSION: Chronic depression of the lateral tibial plateau. No evidence of
acute re-fracture. If the patient's symptoms warrant further
evaluation, MRI would be the most useful next imaging step.

## 2019-08-09 DIAGNOSIS — K432 Incisional hernia without obstruction or gangrene: Secondary | ICD-10-CM | POA: Diagnosis not present

## 2019-08-15 ENCOUNTER — Other Ambulatory Visit: Payer: Self-pay | Admitting: Internal Medicine

## 2019-09-09 ENCOUNTER — Other Ambulatory Visit: Payer: Self-pay

## 2019-09-09 ENCOUNTER — Ambulatory Visit
Admission: RE | Admit: 2019-09-09 | Discharge: 2019-09-09 | Disposition: A | Discharge status: Home / Self Care | Payer: Medicare Other | Source: Ambulatory Visit | Attending: Internal Medicine | Admitting: Internal Medicine

## 2019-09-09 DIAGNOSIS — Z1231 Encounter for screening mammogram for malignant neoplasm of breast: Secondary | ICD-10-CM | POA: Diagnosis not present

## 2019-09-14 ENCOUNTER — Other Ambulatory Visit: Payer: Self-pay | Admitting: Internal Medicine

## 2019-09-14 NOTE — Telephone Encounter (Signed)
Last OV 06/13/19 Next OV NA Last RF 08/15/19

## 2019-10-11 ENCOUNTER — Telehealth: Payer: Self-pay | Admitting: Internal Medicine

## 2019-10-11 NOTE — Telephone Encounter (Signed)
New message:   Pt is calling and states the current dosage of gabapentin (NEURONTIN) 600 MG tablet is not working and would like to increase this medication. Pt states 1200 mg is not strong enough to help with the issue. Please advise.

## 2019-10-12 NOTE — Telephone Encounter (Signed)
She needs an appt.

## 2019-10-26 ENCOUNTER — Telehealth: Payer: Self-pay | Admitting: Internal Medicine

## 2019-10-26 NOTE — Telephone Encounter (Signed)
She received 60 pills on 7/1

## 2019-10-26 NOTE — Telephone Encounter (Signed)
New message:   Pt is calling and needing clarification on taking her medication. She states every time she picks up the medication it is a change in the instructions. Please advise.  ALPRAZolam (XANAX) 0.5 MG tablet metFORMIN (GLUCOPHAGE) 500 MG tablet

## 2019-10-27 NOTE — Telephone Encounter (Signed)
Spoke with patient today. 

## 2019-11-14 ENCOUNTER — Other Ambulatory Visit: Payer: Self-pay | Admitting: Internal Medicine

## 2019-11-16 NOTE — Telephone Encounter (Signed)
New message:   Pt is calling and states that [place] [place] - [address] needs a new prescription sent for the ALPRAZolam Duanne Moron) 0.5 MG tablet due to her no longer using [facility]. Pt states if we receive any PA for any of her medications from [facility] to deny them because she is no longer using them. Please advise.

## 2019-11-17 ENCOUNTER — Other Ambulatory Visit: Payer: Self-pay | Admitting: Internal Medicine

## 2019-11-17 NOTE — Telephone Encounter (Signed)
Check [place] registry last filled 10/13/2019.Marland KitchenJohny Griffin

## 2019-11-18 ENCOUNTER — Other Ambulatory Visit: Payer: Self-pay | Admitting: Internal Medicine

## 2019-11-18 NOTE — Telephone Encounter (Signed)
   Patient requesting prescription be updated to reflect medication is being taken 2x daily (ALPRAZolam (XANAX) 0.5 MG tablet)

## 2019-11-21 ENCOUNTER — Telehealth: Payer: Self-pay | Admitting: Internal Medicine

## 2019-11-21 ENCOUNTER — Other Ambulatory Visit: Payer: Self-pay

## 2019-11-21 ENCOUNTER — Ambulatory Visit (INDEPENDENT_AMBULATORY_CARE_PROVIDER_SITE_OTHER): Payer: Medicare Other | Admitting: Internal Medicine

## 2019-11-21 ENCOUNTER — Encounter: Payer: Self-pay | Admitting: Internal Medicine

## 2019-11-21 VITALS — BP 138/62 | HR 64 | Temp 98.6°F | Resp 16 | Ht 66.0 in | Wt 178.0 lb

## 2019-11-21 DIAGNOSIS — E1143 Type 2 diabetes mellitus with diabetic autonomic (poly)neuropathy: Secondary | ICD-10-CM | POA: Diagnosis not present

## 2019-11-21 DIAGNOSIS — H1132 Conjunctival hemorrhage, left eye: Secondary | ICD-10-CM | POA: Diagnosis not present

## 2019-11-21 DIAGNOSIS — I10 Essential (primary) hypertension: Secondary | ICD-10-CM

## 2019-11-21 LAB — POCT GLYCOSYLATED HEMOGLOBIN (HGB A1C): Hemoglobin A1C: 6.5 % — AB (ref 4.0–5.6)

## 2019-11-21 NOTE — Progress Notes (Signed)
Subjective:  Patient ID: [name], female    DOB: 02-07-1956  Age: 64 y.o. MRN: [id number]  CC: Diabetes  This visit occurred during the SARS-CoV-2 public health emergency.  Safety protocols were in place, including screening questions prior to the visit, additional usage of staff PPE, and extensive cleaning of exam room while observing appropriate contact time as indicated for disinfecting solutions.    HPI [name] presents for f/up - She accidentally stuck her self in the left eye 3 days ago with a fingernail.  She has painless blood accumulating over the sclera.  She denies eye pain, photophobia, visual disturbance, bleeding, discharge, or matting.  Outpatient Medications Prior to Visit  Medication Sig Dispense Refill  . ALPRAZolam (XANAX) 0.5 MG tablet TAKE 1 TABLET BY MOUTH ONCE DAILY AS NEEDED FOR ANXIETY 60 tablet 0  . blood glucose meter kit and supplies KIT Dispense based on patient and insurance preference. Use up to four times daily as directed. (FOR E11.9). 1 each 0  . hydrochlorothiazide (HYDRODIURIL) 25 MG tablet Take 1 tablet by mouth once daily 90 tablet 1  . lisinopril (ZESTRIL) 10 MG tablet Take 1 tablet by mouth once daily 90 tablet 1  . metFORMIN (GLUCOPHAGE) 500 MG tablet Take 1 tablet (500 mg total) by mouth 2 (two) times daily with a meal. 180 tablet 3  . naproxen sodium (ANAPROX) 220 MG tablet Take 440 mg by mouth 2 (two) times daily as needed (pain).    . fenofibrate (TRICOR) 145 MG tablet Take 1 tablet (145 mg total) by mouth daily. 30 tablet 5  . gabapentin (NEURONTIN) 600 MG tablet Take 1 tablet (600 mg total) by mouth 3 (three) times daily. 270 tablet 1   No facility-administered medications prior to visit.    ROS Review of Systems  Constitutional: Negative.  Negative for chills, fatigue and fever.  HENT: Negative.   Eyes: Positive for redness. Negative for photophobia, pain, discharge and itching.  Respiratory: Negative  for cough, chest tightness, shortness of breath and wheezing.   Cardiovascular: Negative for chest pain, palpitations and leg swelling.  Endocrine: Negative.  Negative for polydipsia, polyphagia and polyuria.  Genitourinary: Negative.  Negative for difficulty urinating.  Musculoskeletal: Negative.  Negative for arthralgias.  Skin: Negative.  Negative for rash.  Neurological: Negative.  Negative for dizziness and headaches.  Hematological: Negative for adenopathy. Does not bruise/bleed easily.  Psychiatric/Behavioral: Negative for dysphoric mood, sleep disturbance and suicidal ideas. The patient is nervous/anxious.     Objective:  BP 138/62 (BP Location: Left Arm, Patient Position: Sitting, Cuff Size: Normal)   Pulse 64   Temp 98.6 F (37 C) (Oral)   Resp 16   Ht 5' 6" (1.676 m)   Wt 178 lb (80.7 kg)   SpO2 98%   BMI 28.73 kg/m   BP Readings from Last 3 Encounters:  11/21/19 138/62  06/13/19 124/68  12/13/18 (!) 144/78    Wt Readings from Last 3 Encounters:  11/21/19 178 lb (80.7 kg)  06/13/19 166 lb (75.3 kg)  12/13/18 171 lb (77.6 kg)    Physical Exam Vitals reviewed.  Constitutional:      Appearance: Normal appearance.  HENT:     Nose: Nose normal.     Mouth/Throat:     Mouth: Mucous membranes are moist.  Eyes:     General: Lids are normal.        Right eye: No foreign body, discharge or hordeolum.  Left eye: No foreign body, discharge or hordeolum.     Conjunctiva/sclera:     Left eye: Hemorrhage present.     Pupils: Pupils are equal, round, and reactive to light.     Comments: See photo.  There is intense hemorrhage over the inferior medial aspect of the bulbar conjunctival surface.  There is also hemorrhage and injection of the superior medial surface.  The corneal epithelial surface is normal.  The anterior chamber is quiet with no blood.  Cardiovascular:     Rate and Rhythm: Normal rate and regular rhythm.  Pulmonary:     Effort: Pulmonary effort is  normal.     Breath sounds: No stridor. No wheezing, rhonchi or rales.  Abdominal:     General: Abdomen is flat.     Tenderness: There is no abdominal tenderness. There is no guarding.     Hernia: No hernia is present.  Musculoskeletal:        General: Normal range of motion.     Cervical back: Neck supple.     Right lower leg: No edema.     Left lower leg: No edema.  Lymphadenopathy:     Cervical: No cervical adenopathy.  Skin:    General: Skin is warm and dry.  Neurological:     Mental Status: She is alert.     Lab Results  Component Value Date   WBC 8.4 06/13/2019   HGB 12.7 06/13/2019   HCT 38.7 06/13/2019   PLT 322.0 06/13/2019   GLUCOSE 143 (H) 06/13/2019   CHOL 149 06/13/2019   TRIG 135.0 06/13/2019   HDL 40.20 06/13/2019   LDLDIRECT 51.0 12/13/2018   LDLCALC 82 06/13/2019   ALT 12 06/13/2019   AST 17 06/13/2019   NA 138 06/13/2019   K 4.2 06/13/2019   CL 104 06/13/2019   CREATININE 0.93 06/13/2019   BUN 25 (H) 06/13/2019   CO2 23 06/13/2019   TSH 0.82 10/09/2015   INR 0.91 04/01/2013   HGBA1C 6.5 (A) 11/21/2019   MICROALBUR 11.0 (H) 12/01/2016    MM 3D SCREEN BREAST BILATERAL  Result Date: 09/13/2019 CLINICAL DATA:  Screening. EXAM: DIGITAL SCREENING BILATERAL MAMMOGRAM WITH TOMO AND CAD COMPARISON:  Previous exam(s). ACR Breast Density Category b: There are scattered areas of fibroglandular density. FINDINGS: There are no findings suspicious for malignancy. Images were processed with CAD. IMPRESSION: No mammographic evidence of malignancy. A result letter of this screening mammogram will be mailed directly to the patient. RECOMMENDATION: Screening mammogram in one year. (Code:SM-B-01Y) BI-RADS CATEGORY  1: Negative. Electronically Signed   By: Abelardo Diesel M.D.   On: 09/13/2019 13:35    Assessment & Plan:   Aeon was seen today for diabetes.  Diagnoses and all orders for this visit:  Subconjunctival hemorrhage, traumatic, left- I do not see any  evidence of complications and she is asymptomatic with this.  I gave her information about this.  Type 2 diabetes mellitus with diabetic autonomic neuropathy, without long-term current use of insulin ([place])- Her A1c is at 6.5%.  Her blood sugars are adequately well controlled. -     POCT glycosylated hemoglobin (Hb A1C)  Essential hypertension- Her blood pressure is adequately well controlled.   I am having Eilis Toda "Ajiah Phillip Heal" maintain her naproxen sodium, blood glucose meter kit and supplies, metFORMIN, lisinopril, hydrochlorothiazide, and ALPRAZolam.  No orders of the defined types were placed in this encounter.    Follow-up: Return if symptoms worsen or fail to improve.  Marcello Moores  Ronnald Ramp, MD

## 2019-11-21 NOTE — Patient Instructions (Signed)
Subconjunctival Hemorrhage Subconjunctival hemorrhage is bleeding that happens between the white part of your eye (sclera) and the clear membrane that covers the outside of your eye (conjunctiva). There are many tiny blood vessels near the surface of your eye. A subconjunctival hemorrhage happens when one or more of these vessels breaks and bleeds, causing a red patch to appear on your eye. This is similar to a bruise. Depending on the amount of bleeding, the red patch may only cover a small area of your eye or it may cover the entire visible part of the sclera. If a lot of blood collects under the conjunctiva, there may also be swelling. Subconjunctival hemorrhages do not affect your vision or cause pain, but your eye may feel irritated if there is swelling. Subconjunctival hemorrhages usually do not require treatment, and they usually disappear on their own within two weeks. What are the causes? This condition may be caused by:  Mild trauma, such as rubbing your eye too hard.  Blunt injuries, such as from playing sports or having contact with a deployed airbag.  Coughing, sneezing, or vomiting.  Straining, such as when lifting a heavy object.  High blood pressure.  Recent eye surgery.  Diabetes.  Certain medicines, especially blood thinners (anticoagulants).  Other conditions, such as eye tumors, bleeding disorders, or blood vessel abnormalities. Subconjunctival hemorrhages can also happen without an obvious cause. What are the signs or symptoms? Symptoms of this condition include:  A bright red or dark red patch on the white part of the eye. The red area may: ? Spread out to cover a larger area of the eye before it goes away. ? Turn brownish-yellow before it goes away.  Swelling around the eye.  Mild eye irritation. How is this diagnosed? This condition is diagnosed with a physical exam. If your subconjunctival hemorrhage was caused by trauma, your health care provider may refer  you to an eye specialist (ophthalmologist) or another specialist to check for other injuries. You may have other tests, including:  An eye exam.  A blood pressure check.  Blood tests to check for bleeding disorders. If your subconjunctival hemorrhage was caused by trauma, X-rays or a CT scan may be done to check for other injuries. How is this treated? Usually, treatment is not needed for this condition. If you have discomfort, your health care provider may recommend eye drops or cold compresses. Follow these instructions at home:  Take over-the-counter and prescription medicines only as directed by your health care provider.  Use eye drops or cold compresses to help with discomfort as directed by your health care provider.  Avoid activities, things, and environments that may irritate or injure your eye.  Keep all follow-up visits as told by your health care provider. This is important. Contact a health care provider if:  You have pain in your eye.  The bleeding does not go away within 3 weeks.  You keep getting new subconjunctival hemorrhages. Get help right away if:  Your vision changes or you have difficulty seeing.  You suddenly develop severe sensitivity to light.  You develop a severe headache, persistent vomiting, confusion, or abnormal tiredness (lethargy).  Your eye seems to bulge or protrude from your eye socket.  You develop unexplained bruises on your body.  You have unexplained bleeding in another area of your body. Summary  Subconjunctival hemorrhage is bleeding that happens between the white part of your eye and the clear membrane that covers the outside of your eye.  This condition   is similar to a bruise.  Subconjunctival hemorrhages usually do not require treatment, and they usually disappear on their own within two weeks.  Use eye drops or cold compresses to help with discomfort as directed by your health care provider. This information is not  intended to replace advice given to you by your health care provider. Make sure you discuss any questions you have with your health care provider. Document Revised: 09/15/2018 Document Reviewed: 12/30/2017 Elsevier Patient Education  2020 Elsevier Inc.  

## 2019-11-23 ENCOUNTER — Other Ambulatory Visit: Payer: Self-pay | Admitting: Internal Medicine

## 2019-11-23 DIAGNOSIS — F419 Anxiety disorder, unspecified: Secondary | ICD-10-CM

## 2019-11-23 MED ORDER — ALPRAZOLAM 0.5 MG PO TABS: 0.5000 mg | ORAL_TABLET | Freq: Two times a day (BID) | ORAL | 1 refills | Status: DC | PRN

## 2019-11-23 NOTE — Telephone Encounter (Signed)
Pt contacted and informed that rx was sent in on August 5th. Pharmacy is stating that they still can not fill it because the directions does state 1 tablet twice per day to match the quantity.

## 2019-11-23 NOTE — Telephone Encounter (Addendum)
    Patient calling to discuss Xanax refill, pharmacy does not have refill request. Patient states Dr Ronnald Ramp was going to send new order Patient number [phone]

## 2019-11-24 ENCOUNTER — Other Ambulatory Visit: Payer: Self-pay

## 2019-11-24 MED ORDER — HYDROCHLOROTHIAZIDE 25 MG PO TABS: 25.0000 mg | ORAL_TABLET | Freq: Every day | ORAL | 1 refills | Status: DC

## 2019-11-24 MED ORDER — LISINOPRIL 10 MG PO TABS: 10.0000 mg | ORAL_TABLET | Freq: Every day | ORAL | 1 refills | Status: DC

## 2019-11-24 NOTE — Telephone Encounter (Signed)
Pt contacted and informed rx has been sent in.

## 2019-11-26 ENCOUNTER — Other Ambulatory Visit: Payer: Self-pay | Admitting: Internal Medicine

## 2019-11-26 DIAGNOSIS — F419 Anxiety disorder, unspecified: Secondary | ICD-10-CM

## 2019-12-14 ENCOUNTER — Ambulatory Visit: Payer: Medicare Other | Admitting: Internal Medicine

## 2019-12-15 NOTE — Progress Notes (Deleted)
Subjective:    Patient ID: [name], female    DOB: 07-26-1955, 64 y.o.   MRN: [id number]  HPI The patient is here for an acute visit.   Her toenail lifeted off.  soaking it in cold water for 20 minutes. elevating it. clipping any sharp or jagged edges of the remaining nail. cleaning any exposed part of your nail bed and applying an antibiotic ointment. applying a fresh bandage daily for the next 7 to 10 days, or until the skin hardens.   Medications and allergies reviewed with patient and updated if appropriate.  Patient Active Problem List   Diagnosis Date Noted  . Subconjunctival hemorrhage, traumatic, left 11/21/2019  . Hypertriglyceridemia 06/13/2019  . Aortic atherosclerosis ([place]) 06/13/2019  . B12 deficiency 06/15/2016  . Anxiety 06/12/2016  . Type 2 diabetes mellitus with diabetic autonomic neuropathy, without long-term current use of insulin ([place]) 01/10/2016  . Peristomal hernia 04/19/2012  . Seborrheic dermatitis 08/09/2010  . PERSONAL HX RECTAL CANCER 06/05/2010  . Tobacco abuse 04/16/2010  . ALLERGIC RHINITIS 04/16/2010  . Peripheral neuropathy ([place]) 12/06/2009  . Essential hypertension 12/06/2009  . Diarrhea 12/06/2009    Current Outpatient Medications on File Prior to Visit  Medication Sig Dispense Refill  . ALPRAZolam (XANAX) 0.5 MG tablet Take 1 tablet (0.5 mg total) by mouth 2 (two) times daily as needed for anxiety. 60 tablet 1  . blood glucose meter kit and supplies KIT Dispense based on patient and insurance preference. Use up to four times daily as directed. (FOR E11.9). 1 each 0  . fenofibrate (TRICOR) 145 MG tablet TAKE 1 TABLET BY MOUTH ONCE DAILY 30 tablet 11  . gabapentin (NEURONTIN) 600 MG tablet TAKE 1 TABLET BY MOUTH 3  TIMES DAILY 90 tablet 11  . hydrochlorothiazide (HYDRODIURIL) 25 MG tablet Take 1 tablet (25 mg total) by mouth daily. 90 tablet 1  . lisinopril (ZESTRIL) 10 MG tablet Take 1 tablet (10 mg total) by mouth  daily. 90 tablet 1  . metFORMIN (GLUCOPHAGE) 500 MG tablet Take 1 tablet (500 mg total) by mouth 2 (two) times daily with a meal. 180 tablet 3  . naproxen sodium (ANAPROX) 220 MG tablet Take 440 mg by mouth 2 (two) times daily as needed (pain).     No current facility-administered medications on file prior to visit.    Past Medical History:  Diagnosis Date  . Adenomatous colon polyp   . Anemia, unspecified   . Cocaine abuse ([place])    as recently as 10/21/11  . Depression   . Difficulty sleeping    takes meds to sleep  . Diverticulosis   . Hernia 03/18/2013  . History of transfusion   . Hypertension   . Incontinence of bowel    since colostomy  . Knee fracture, left   . 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 Nonspecific colitis 10/10/11  . Radiation proctitis    mild  . 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 Tibial plateau fracture, left 01/19/2014    Past Surgical History:  Procedure Laterality Date  . APPENDECTOMY N/A 01/09/2015   Procedure: APPENDECTOMY;  Surgeon: Excell Seltzer, MD;  Location: [facility];  Service: General;  Laterality: N/A;  . COLOSTOMY  2013   [facility]  . FINGER FRACTURE SURGERY Left    and hand surgery  . HERNIA REPAIR    . LAPAROSCOPIC PARASTOMAL HERNIA N/A 05/12/2013   Procedure: LAPAROSCOPIC PARASTOMAL HERNIA;  Surgeon: Leighton Ruff, MD;  Location: [facility];  Service: General;  Laterality: N/A;  . LAPAROTOMY N/A 01/09/2015   Procedure: EXPLORATORY LAPAROTOMY;  Surgeon: Excell Seltzer, MD;  Location: [facility];  Service: General;  Laterality: N/A;  RESECTION OF COLOSTOMY, CREATION OF NEW COLOSTOMY, VENTRAL HERNIA REPAIR WITH BIOLOGIC MESH,  LYSIS OF ADHESIONS , OMENTECTOMY  . LOW ANTERIOR BOWEL RESECTION  03/07/09  . MOUTH SURGERY  10/2009  . PORTACATH PLACEMENT     and removal    Social History   Socioeconomic History  . Marital status: Significant Other    Spouse name: Not on file  . Number of children: 0  .  Years of education: Not on file  . Highest education level: Not on file  Occupational History  . Occupation: disabiled  Tobacco Use  . Smoking status: Current Every Day Smoker    Packs/day: 0.25    Years: 40.00    Pack years: 10.00    Types: Cigarettes  . Smokeless tobacco: Never Used  . Tobacco comment: Pt. in project free program, motivated to stop smoking completely. Declined smoking cessation cone program.  Vaping Use  . Vaping Use: Never used  Substance and Sexual Activity  . Alcohol use: Yes    Alcohol/week: 12.0 standard drinks    Types: 12 Standard drinks or equivalent per week    Comment: occasional  driink  . Drug use: Yes    Types: Marijuana, Cocaine    Comment: no longer using Marijuana; last use of cocaine nov 2014   . Sexual activity: Not on file  Other Topics Concern  . 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 Difficulty of Paying Living Expenses: Not on file  Food Insecurity:   . Worried About Charity fundraiser in the Last Year: Not on file  . Ran Out of Food in the Last Year: Not on file  Transportation Needs:   . Lack of Transportation (Medical): Not on file  . Lack of Transportation (Non-Medical): Not on file  Physical Activity:   . Days of Exercise per Week: Not on file  . Minutes of Exercise per Session: Not on file  Stress:   . Feeling of Stress : Not on file  Social Connections:   . Frequency of Communication with Friends and Family: Not on file  . Frequency of Social Gatherings with Friends and Family: Not on file  . Attends  Religious Services: Not on file  . Active Member of Clubs or Organizations: Not on file  . Attends Archivist Meetings: Not on file  . Marital Status: Not on file    Family History  Problem Relation Age of Onset  . Colon cancer Mother   . Diabetes Father   . Cirrhosis Brother   . Stomach cancer Sister   . Colon polyps Sister   . Pulmonary Hypertension Brother     Review of Systems     Objective:  There were no vitals filed for this visit. BP Readings from Last 3 Encounters:  11/21/19 138/62  06/13/19 124/68  12/13/18 (!) 144/78   Wt Readings from Last 3 Encounters:  11/21/19 178 lb (80.7 kg)  06/13/19 166 lb (75.3 kg)  12/13/18 171 lb (77.6 kg)   There is no height or weight on file to calculate BMI.   Physical Exam         Assessment & Plan:    See Problem List for Assessment and Plan of chronic medical problems.    This visit occurred during the SARS-CoV-2 public health emergency.  Safety protocols were in place, including screening questions prior to the visit, additional usage of staff PPE, and extensive cleaning of exam room while observing appropriate contact time as indicated for disinfecting solutions.

## 2019-12-16 ENCOUNTER — Ambulatory Visit: Payer: Medicare Other | Admitting: Internal Medicine

## 2019-12-19 ENCOUNTER — Telehealth: Payer: Self-pay

## 2019-12-19 NOTE — Telephone Encounter (Signed)
Called pt to r/s AWV with [facility] but voice mail box was full

## 2019-12-23 ENCOUNTER — Ambulatory Visit: Payer: Medicare Other | Admitting: Internal Medicine

## 2020-01-02 ENCOUNTER — Ambulatory Visit (INDEPENDENT_AMBULATORY_CARE_PROVIDER_SITE_OTHER): Payer: Medicare Other

## 2020-01-02 DIAGNOSIS — Z0000 Encounter for general adult medical examination without abnormal findings: Secondary | ICD-10-CM

## 2020-01-02 DIAGNOSIS — Z23 Encounter for immunization: Secondary | ICD-10-CM

## 2020-01-02 NOTE — Patient Instructions (Addendum)
Ms. Shimkus , Thank you for taking time to come for your Medicare Wellness Visit. I appreciate your ongoing commitment to your health goals. Please review the following plan we discussed and let me know if I can assist you in the future.   Screening recommendations/referrals: Colonoscopy: 01/09/2015; due every 5 years Mammogram: 09/09/2019; due once a year Bone Density: 12/05/2015; repeat every 2-3 years Recommended yearly ophthalmology/optometry visit for glaucoma screening and checkup Recommended yearly dental visit for hygiene and checkup  Vaccinations: Influenza vaccine: 01/02/2020 Pneumococcal vaccine: completed Tdap vaccine: 05/01/2010; due every 10 years Shingles vaccine: never done  Covid-19: 01/02/2020  Advanced directives: Advance directive discussed with you today. Even though you declined this today please call our office should you change your mind and we can give you the proper paperwork for you to fill out.  Conditions/risks identified: Yes; Reviewed health maintenance screenings with patient today and relevant education, vaccines, and/or referrals were provided. Please continue to do your personal lifestyle choices by: daily care of teeth and gums, regular physical activity (goal should be 5 days a week for 30 minutes), eat a healthy diet, avoid tobacco and drug use, limiting any alcohol intake, taking a low-dose aspirin (if not allergic or have been advised by your provider otherwise) and taking vitamins and minerals as recommended by your provider. Continue doing brain stimulating activities (puzzles, reading, adult coloring books, staying active) to keep memory sharp. Continue to eat heart healthy diet (full of fruits, vegetables, whole grains, lean protein, water--limit salt, fat, and sugar intake) and increase physical activity as tolerated.  Next appointment: Please schedule your next Medicare Wellness Visit with your Nurse Health Advisor in 1 year.  Preventive Care  40-64 Years, Female Preventive care refers to lifestyle choices and visits with your health care provider that can promote health and wellness. What does preventive care include?  A yearly physical exam. This is also called an annual well check.  Dental exams once or twice a year.  Routine eye exams. Ask your health care provider how often you should have your eyes checked.  Personal lifestyle choices, including:  Daily care of your teeth and gums.  Regular physical activity.  Eating a healthy diet.  Avoiding tobacco and drug use.  Limiting alcohol use.  Practicing safe sex.  Taking low-dose aspirin daily starting at age 7.  Taking vitamin and mineral supplements as recommended by your health care provider. What happens during an annual well check? The services and screenings done by your health care provider during your annual well check will depend on your age, overall health, lifestyle risk factors, and family history of disease. Counseling  Your health care provider may ask you questions about your:  Alcohol use.  Tobacco use.  Drug use.  Emotional well-being.  Home and relationship well-being.  Sexual activity.  Eating habits.  Work and work Statistician.  Method of birth control.  Menstrual cycle.  Pregnancy history. Screening  You may have the following tests or measurements:  Height, weight, and BMI.  Blood pressure.  Lipid and cholesterol levels. These may be checked every 5 years, or more frequently if you are over 56 years old.  Skin check.  Lung cancer screening. You may have this screening every year starting at age 19 if you have a 30-pack-year history of smoking and currently smoke or have quit within the past 15 years.  Fecal occult blood test (FOBT) of the stool. You may have this test every year starting at age 55.  Flexible sigmoidoscopy or colonoscopy. You may have a sigmoidoscopy every 5 years or a colonoscopy every 10 years  starting at age 81.  Hepatitis C blood test.  Hepatitis B blood test.  Sexually transmitted disease (STD) testing.  Diabetes screening. This is done by checking your blood sugar (glucose) after you have not eaten for a while (fasting). You may have this done every 1-3 years.  Mammogram. This may be done every 1-2 years. Talk to your health care provider about when you should start having regular mammograms. This may depend on whether you have a family history of breast cancer.  BRCA-related cancer screening. This may be done if you have a family history of breast, ovarian, tubal, or peritoneal cancers.  Pelvic exam and Pap test. This may be done every 3 years starting at age 53. Starting at age 69, this may be done every 5 years if you have a Pap test in combination with an HPV test.  Bone density scan. This is done to screen for osteoporosis. You may have this scan if you are at high risk for osteoporosis. Discuss your test results, treatment options, and if necessary, the need for more tests with your health care provider. Vaccines  Your health care provider may recommend certain vaccines, such as:  Influenza vaccine. This is recommended every year.  Tetanus, diphtheria, and acellular pertussis (Tdap, Td) vaccine. You may need a Td booster every 10 years.  Zoster vaccine. You may need this after age 52.  Pneumococcal 13-valent conjugate (PCV13) vaccine. You may need this if you have certain conditions and were not previously vaccinated.  Pneumococcal polysaccharide (PPSV23) vaccine. You may need one or two doses if you smoke cigarettes or if you have certain conditions. Talk to your health care provider about which screenings and vaccines you need and how often you need them. This information is not intended to replace advice given to you by your health care provider. Make sure you discuss any questions you have with your health care provider. Document Released: 04/27/2015 Document  Revised: 12/19/2015 Document Reviewed: 01/30/2015 Elsevier Interactive Patient Education  2017 [place] Prevention in the Home Falls can cause injuries. They can happen to people of all ages. There are many things you can do to make your home safe and to help prevent falls. What can I do on the outside of my home?  Regularly fix the edges of walkways and driveways and fix any cracks.  Remove anything that might make you trip as you walk through a door, such as a raised step or threshold.  Trim any bushes or trees on the path to your home.  Use bright outdoor lighting.  Clear any walking paths of anything that might make someone trip, such as rocks or tools.  Regularly check to see if handrails are loose or broken. Make sure that both sides of any steps have handrails.  Any raised decks and porches should have guardrails on the edges.  Have any leaves, snow, or ice cleared regularly.  Use sand or salt on walking paths during winter.  Clean up any spills in your garage right away. This includes oil or grease spills. What can I do in the bathroom?  Use night lights.  Install grab bars by the toilet and in the tub and shower. Do not use towel bars as grab bars.  Use non-skid mats or decals in the tub or shower.  If you need to sit down in the shower,  use a plastic, non-slip stool.  Keep the floor dry. Clean up any water that spills on the floor as soon as it happens.  Remove soap buildup in the tub or shower regularly.  Attach bath mats securely with double-sided non-slip rug tape.  Do not have throw rugs and other things on the floor that can make you trip. What can I do in the bedroom?  Use night lights.  Make sure that you have a light by your bed that is easy to reach.  Do not use any sheets or blankets that are too big for your bed. They should not hang down onto the floor.  Have a firm chair that has side arms. You can use this for support while  you get dressed.  Do not have throw rugs and other things on the floor that can make you trip. What can I do in the kitchen?  Clean up any spills right away.  Avoid walking on wet floors.  Keep items that you use a lot in easy-to-reach places.  If you need to reach something above you, use a strong step stool that has a grab bar.  Keep electrical cords out of the way.  Do not use floor polish or wax that makes floors slippery. If you must use wax, use non-skid floor wax.  Do not have throw rugs and other things on the floor that can make you trip. What can I do with my stairs?  Do not leave any items on the stairs.  Make sure that there are handrails on both sides of the stairs and use them. Fix handrails that are broken or loose. Make sure that handrails are as long as the stairways.  Check any carpeting to make sure that it is firmly attached to the stairs. Fix any carpet that is loose or worn.  Avoid having throw rugs at the top or bottom of the stairs. If you do have throw rugs, attach them to the floor with carpet tape.  Make sure that you have a light switch at the top of the stairs and the bottom of the stairs. If you do not have them, ask someone to add them for you. What else can I do to help prevent falls?  Wear shoes that:  Do not have high heels.  Have rubber bottoms.  Are comfortable and fit you well.  Are closed at the toe. Do not wear sandals.  If you use a stepladder:  Make sure that it is fully opened. Do not climb a closed stepladder.  Make sure that both sides of the stepladder are locked into place.  Ask someone to hold it for you, if possible.  Clearly mark and make sure that you can see:  Any grab bars or handrails.  First and last steps.  Where the edge of each step is.  Use tools that help you move around (mobility aids) if they are needed. These include:  Canes.  Walkers.  Scooters.  Crutches.  Turn on the lights when you go  into a dark area. Replace any light bulbs as soon as they burn out.  Set up your furniture so you have a clear path. Avoid moving your furniture around.  If any of your floors are uneven, fix them.  If there are any pets around you, be aware of where they are.  Review your medicines with your doctor. Some medicines can make you feel dizzy. This can increase your chance of falling. Ask your doctor  what other things that you can do to help prevent falls. This information is not intended to replace advice given to you by your health care provider. Make sure you discuss any questions you have with your health care provider. Document Released: 01/25/2009 Document Revised: 09/06/2015 Document Reviewed: 05/05/2014 Elsevier Interactive Patient Education  2017 [employer].

## 2020-01-02 NOTE — Addendum Note (Signed)
Addended by: Sheral Flow on: 01/02/2020 10:46 AM   Modules accepted: Miquel Dunn

## 2020-01-02 NOTE — Addendum Note (Signed)
Addended by: Sheral Flow on: 01/02/2020 10:40 AM   Modules accepted: Miquel Dunn

## 2020-01-02 NOTE — Progress Notes (Signed)
Subjective:   [name] is a 65 y.o. female who presents for Medicare Annual (Subsequent) preventive examination.  Review of Systems    No ROS. Medicare Wellness Visit. Cardiac Risk Factors include: advanced age (>44mn, >>24women);diabetes mellitus;dyslipidemia;hypertension;smoking/ tobacco exposure     Objective:    Today's Vitals   01/02/20 0901  BP: 110/60  Pulse: 68  Resp: 16  Temp: 98.2 F (36.8 C)  SpO2: 98%  Weight: 179 lb (81.2 kg)  Height: '5\' 6"'  (1.676 m)  PainSc: 0-No pain   Body mass index is 28.89 kg/m.  Advanced Directives 01/02/2020 03/25/2017 10/20/2016 01/10/2016 01/09/2015 01/09/2015 12/19/2014  Does Patient Have a Medical Advance Directive? No No No No No No No  Would patient like information on creating a medical advance directive? No - Patient declined No - Patient declined - No - patient declined information Yes - Educational materials given - No - patient declined information    Current Medications (verified) Outpatient Encounter Medications as of 01/02/2020  Medication Sig  . ALPRAZolam (XANAX) 0.5 MG tablet Take 1 tablet (0.5 mg total) by mouth 2 (two) times daily as needed for anxiety.  . blood glucose meter kit and supplies KIT Dispense based on patient and insurance preference. Use up to four times daily as directed. (FOR E11.9).  . fenofibrate (TRICOR) 145 MG tablet TAKE 1 TABLET BY MOUTH ONCE DAILY  . gabapentin (NEURONTIN) 600 MG tablet TAKE 1 TABLET BY MOUTH 3  TIMES DAILY  . hydrochlorothiazide (HYDRODIURIL) 25 MG tablet Take 1 tablet (25 mg total) by mouth daily.  .Marland Kitchenlisinopril (ZESTRIL) 10 MG tablet Take 1 tablet (10 mg total) by mouth daily.  . metFORMIN (GLUCOPHAGE) 500 MG tablet Take 1 tablet (500 mg total) by mouth 2 (two) times daily with a meal.  . naproxen sodium (ANAPROX) 220 MG tablet Take 440 mg by mouth 2 (two) times daily as needed (pain).   No facility-administered encounter medications on file as of 01/02/2020.     Allergies (verified) Ampicillin, Hydrocodone, Metformin and related, Penicillins, and Grapeseed extract [nutritional supplements]   History: Past Medical History:  Diagnosis Date  . Adenomatous colon polyp   . Anemia, unspecified   . [place]    as recently as 10/21/11  . Depression   . Difficulty sleeping    takes meds to sleep  . Diverticulosis   . Hernia 03/18/2013  . History of transfusion   . Hypertension   . Incontinence of bowel    since colostomy  . Knee fracture, left   . 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 Nonspecific colitis 10/10/11  . Radiation proctitis    mild  . [place]   LAR surg 02/2009, chemo thru 09/2009  . Tibial plateau fracture, left 01/19/2014   Past Surgical History:  Procedure Laterality Date  . APPENDECTOMY N/A 01/09/2015   Procedure: APPENDECTOMY;  Surgeon: BExcell Seltzer MD;  [facility];  Service: General;  Laterality: N/A;  . COLOSTOMY  2013   [facility]  . FINGER FRACTURE SURGERY Left    and hand surgery  . HERNIA REPAIR    . LAPAROSCOPIC PARASTOMAL HERNIA N/A 05/12/2013   Procedure: LAPAROSCOPIC PARASTOMAL HERNIA;  Surgeon: ALeighton Ruff MD;  [facility];  Service: General;  Laterality: N/A;  . LAPAROTOMY N/A 01/09/2015   Procedure: EXPLORATORY LAPAROTOMY;  Surgeon: BExcell Seltzer MD;  [facility];  Service: General;  Laterality: N/A;  RESECTION OF COLOSTOMY, CREATION OF  NEW COLOSTOMY, VENTRAL HERNIA REPAIR WITH BIOLOGIC MESH,  LYSIS OF ADHESIONS , OMENTECTOMY  . LOW ANTERIOR BOWEL RESECTION  03/07/09  . MOUTH SURGERY  10/2009  . PORTACATH PLACEMENT     and removal   Family History  Problem Relation Age of Onset  . Colon cancer Mother   . Diabetes Father   . Cirrhosis Brother   . Stomach cancer Sister   . Colon polyps Sister   . Pulmonary Hypertension Brother    Social History   Socioeconomic History  . Marital status: Significant Other    Spouse name: Not on file   . Number of children: 0  . Years of education: Not on file  . Highest education level: Not on file  Occupational History  . Occupation: disabiled  Tobacco Use  . Smoking status: Current Every Day Smoker    Packs/day: 0.25    Years: 40.00    Pack years: 10.00    Types: Cigarettes  . Smokeless tobacco: Never Used  . Tobacco comment: Pt. in project free program, motivated to stop smoking completely. Declined smoking cessation [facility] program.  Vaping Use  . Vaping Use: Never used  Substance and Sexual Activity  . Alcohol use: Yes    Alcohol/week: 12.0 standard drinks    Types: 12 Standard drinks or equivalent per week    Comment: occasional  driink  . Drug use: Yes    Types: Marijuana, Cocaine    Comment: no longer using Marijuana; last use of cocaine nov 2014   . Sexual activity: Not on file  Other Topics Concern  . 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 Difficulty of Paying Living Expenses: Not hard at all  Food Insecurity: No Food Insecurity  . Worried About Charity fundraiser in the Last Year: Never true  . Ran Out of Food in the Last Year: Never true  Transportation Needs: No Transportation Needs  . Lack of Transportation (Medical): No  . Lack of Transportation (Non-Medical): No  Physical Activity: Sufficiently Active  . Days of Exercise per Week: 5 days  . Minutes of Exercise per Session: 30 min  Stress: Stress Concern Present  . Feeling of Stress : Rather much  Social Connections: Unknown  . Frequency of Communication with Friends and  Family: More than three times a week  . Frequency of Social Gatherings with Friends and Family: More than three times a week  . Attends Religious Services: Patient refused  . Active Member of Clubs or Organizations: Patient refused  . Attends Archivist Meetings: Patient refused  . Marital Status: Living with partner    Tobacco Counseling Ready to quit: Not Answered Counseling given: Not Answered Comment: Pt. in project free program, motivated to stop smoking completely. Declined smoking cessation [facility] program.   Clinical Intake:  Pre-visit preparation completed: Yes  Pain : No/denies pain Pain Score: 0-No pain     BMI - recorded: 28.89 Nutritional Status: BMI 25 -29 Overweight Nutritional Risks: None Diabetes: Yes CBG done?: Yes (118) CBG resulted in Enter/  Edit results?: Yes Did pt. bring in CBG monitor from home?: No Glucose Meter Downloaded?: No  How often do you need to have someone help you when you read instructions, pamphlets, or other written materials from your doctor or pharmacy?: 1 - Never What is the last grade level you completed in school?: Bachelor's Degree in [employer] from [employer]  Diabetic? yes  Interpreter Needed?: No  Information entered by :: Lisette Abu, LPN   Activities of Daily Living In your present state of health, do you have any difficulty performing the following activities: 01/02/2020  Hearing? N  Vision? N  Difficulty concentrating or making decisions? N  Walking or climbing stairs? N  Dressing or bathing? N  Doing errands, shopping? N  Preparing Food and eating ? N  Using the Toilet? N  In the past six months, have you accidently leaked urine? N  Do you have problems with loss of bowel control? Y  Comment wears a colostomy bag on right side  Managing your Medications? N  Managing your Finances? N  Housekeeping or managing your Housekeeping? N  Some recent data might be hidden    Patient Care  Team: Binnie Rail, MD as PCP - General (Internal Medicine) Nunzio Cobbs, MD (Obstetrics and Gynecology) Jana Half, [place] (Podiatry) Heath Lark, MD as Consulting Physician (Hematology and Oncology) Leighton Ruff, MD (General Surgery) Armbruster, Carlota Raspberry, MD as Consulting Physician (Gastroenterology)  Indicate any recent Medical Services you may have received from other than [facility] providers in the past year (date may be approximate).     Assessment:   This is a routine wellness examination for Nena.  Hearing/Vision screen No exam data present  Dietary issues and exercise activities discussed: Current Exercise Habits: Home exercise routine, Type of exercise: walking;Other - see comments (bike riding and dancing), Time (Minutes): 30, Frequency (Times/Week): 5, Weekly Exercise (Minutes/Week): 150, Intensity: Moderate, Exercise limited by: orthopedic condition(s);psychological condition(s)  Goals    . Weight (lb) < 200 lb (90.7 kg)     Stop smoking completely, especially prior to hernia removal per surgeon's recommendation  Lose 15 pounds      Depression Screen PHQ 2/9 Scores 01/02/2020 11/21/2019 04/20/2018 07/20/2013  PHQ - 2 Score 1 0 0 0  PHQ- 9 Score - - 2 -    Fall Risk Fall Risk  01/02/2020 11/21/2019 06/13/2019 06/15/2018 04/20/2018  Falls in the past year? '1 1 1 1 1  ' Number falls in past yr: 1 0 1 0 1  Injury with Fall? 0 0 0 - 1  Risk for fall due to : Impaired balance/gait;History of fall(s);Medication side effect;Orthopedic patient Orthopedic patient - - History of fall(s);Impaired balance/gait;Impaired mobility  Follow up Falls evaluation completed;Education provided Falls evaluation completed - - -    Any stairs in or around the home? No  If so, are there any without handrails? No  Home free of loose throw rugs in walkways, pet beds, electrical cords, etc? Yes  Adequate lighting in your home to reduce risk of falls? Yes   ASSISTIVE DEVICES UTILIZED TO  PREVENT FALLS:  Life alert? No  Use of a cane, walker or w/c? No  Grab bars in the bathroom? Yes  Shower chair or bench in shower? No  Elevated toilet seat or a handicapped toilet? No   TIMED UP AND GO:  Was the test performed? No .  Length of time to ambulate 10 feet: 0 sec.   Gait steady  and fast without use of assistive device  Cognitive Function:     6CIT Screen 01/02/2020  What Year? 0 points  What month? 0 points  What time? 0 points  Count back from 20 0 points  Months in reverse 0 points  Repeat phrase 0 points  Total Score 0    Immunizations Immunization History  Administered Date(s) Administered  . Influenza,inj,Quad PF,6+ Mos 03/18/2013, 05/23/2014, 01/10/2016, 04/03/2017, 12/09/2017, 12/13/2018  . Pneumococcal Conjugate-13 05/23/2014  . Pneumococcal Polysaccharide-23 06/12/2016  . Td 05/01/2010    TDAP status: Up to date Flu Vaccine status: Up to date Pneumococcal vaccine status: Up to date Covid-19 vaccine status: Completed vaccines  Qualifies for Shingles Vaccine? Yes   Zostavax completed No   Shingrix Completed?: No.    Education has been provided regarding the importance of this vaccine. Patient has been advised to call insurance company to determine out of pocket expense if they have not yet received this vaccine. Advised may also receive vaccine at local pharmacy or Health Dept. Verbalized acceptance and understanding.  Screening Tests Health Maintenance  Topic Date Due  . OPHTHALMOLOGY EXAM  Never done  . COVID-19 Vaccine (1) Never done  . PAP SMEAR-Modifier  12/12/2013  . INFLUENZA VACCINE  11/13/2019  . FOOT EXAM  12/13/2019  . COLONOSCOPY  01/09/2020  . TETANUS/TDAP  05/01/2020  . HEMOGLOBIN A1C  05/23/2020  . MAMMOGRAM  09/08/2020  . DEXA SCAN  12/04/2020  . PNEUMOCOCCAL POLYSACCHARIDE VACCINE AGE 2-64 HIGH RISK  Completed  . Hepatitis C Screening  Completed  . HIV Screening  Completed    Health Maintenance  Health Maintenance  Due  Topic Date Due  . OPHTHALMOLOGY EXAM  Never done  . COVID-19 Vaccine (1) Never done  . PAP SMEAR-Modifier  12/12/2013  . INFLUENZA VACCINE  11/13/2019  . FOOT EXAM  12/13/2019    Colorectal cancer screening: Completed 12/30/2014. Repeat every 5 years Mammogram status: Completed 09/09/2019. Repeat every year Bone Density status: Completed 12/05/2015. Results reflect: Bone density results: NORMAL. Repeat every 3-5 years.  Lung Cancer Screening: (Low Dose CT Chest recommended if Age 21-80 years, 30 pack-year currently smoking OR have quit w/in 15years.) does qualify.   Lung Cancer Screening Referral: no  Additional Screening:  Hepatitis C Screening: does qualify; Completed yes  Vision Screening: Recommended annual ophthalmology exams for early detection of glaucoma and other disorders of the eye. Is the patient up to date with their annual eye exam?  Yes  Who is the provider or what is the name of the office in which the patient attends annual eye exams? [facility] If pt is not established with a provider, would they like to be referred to a provider to establish care? No .   Dental Screening: Recommended annual dental exams for proper oral hygiene  Community Resource Referral / Chronic Care Management: CRR required this visit?  No   CCM required this visit?  No      Plan:     I have personally reviewed and noted the following in the patient's chart:   . Medical and social history . Use of alcohol, tobacco or illicit drugs  . Current medications and supplements . Functional ability and status . Nutritional status . Physical activity . Advanced directives . List of other physicians . Hospitalizations, surgeries, and ER visits in previous 12 months . Vitals . Screenings to include cognitive, depression, and falls . Referrals and appointments  In addition, I have reviewed and  discussed with patient certain preventive protocols, quality  metrics, and best practice recommendations. A written personalized care plan for preventive services as well as general preventive health recommendations were provided to patient.     Sheral Flow, LPN   04/23/2109   Nurse Notes: n/a

## 2020-01-06 ENCOUNTER — Other Ambulatory Visit: Payer: Self-pay | Admitting: *Deleted

## 2020-01-06 DIAGNOSIS — F419 Anxiety disorder, unspecified: Secondary | ICD-10-CM

## 2020-01-06 MED ORDER — ALPRAZOLAM 0.5 MG PO TABS: 0.5000 mg | ORAL_TABLET | Freq: Two times a day (BID) | ORAL | 1 refills | Status: DC | PRN

## 2020-01-06 NOTE — Telephone Encounter (Signed)
Check [place] registry last filled 12/26/2019 30 day at [place].Marland KitchenJohny Griffin

## 2020-01-09 ENCOUNTER — Encounter: Payer: Self-pay | Admitting: Gastroenterology

## 2020-01-09 ENCOUNTER — Telehealth: Payer: Self-pay | Admitting: Internal Medicine

## 2020-01-09 NOTE — Telephone Encounter (Signed)
   Patient requesting order for ostomy supplies be sent to [facility] Phone [phone]

## 2020-01-12 NOTE — Patient Instructions (Addendum)
You had a B12 injection today.   Medications reviewed and updated.  Changes include :   Crestor twice weekly for your cholesterol   Your prescription(s) have been submitted to your pharmacy. Please take as directed and contact our office if you believe you are having problem(s) with the medication(s).     Please followup in 6 months

## 2020-01-12 NOTE — Progress Notes (Signed)
Subjective:    Patient ID: [name], female    DOB: [birth year], 64 y.o.   MRN: [id number]  HPI The patient is here for follow up of their chronic medical problems, including DM, htn, peripheral neuropathy, anxiety, B12 def, smoking   Sugar this morning was 156.  She has not been eating as well as she should.  Toe nail issue: Her left first toenail some of the toenail came off-it was very flaky and white looking.  It looks like it is currently now.  It was initially tender, but no longer tender.  She is exercising regularly.   She could not do it when it was hot.  She is walking  Peripheral neuraopthy-she is taking the gabapentin.  She thinks her neuropathy has gotten worse.  We did decrease the dose because the higher dose was causing side effects and it does not work as well.  Alcohol use  - 12 pack beer or 3 bottles of wine over the weekend by her self.  She does not drink much during the week.   Tobacco abuse: 1 pack lasts 4-5 days.  Patches and gum not effective.  She has not tried any medication.  She knows she should quit, but still enjoys smoking.  There is increased stress and smoking does help with that.    Medications and allergies reviewed with patient and updated if appropriate.  Patient Active Problem List   Diagnosis Date Noted  . Subconjunctival hemorrhage, traumatic, left 11/21/2019  . Hypertriglyceridemia 06/13/2019  . Aortic atherosclerosis ([place]) 06/13/2019  . B12 deficiency 06/15/2016  . Anxiety 06/12/2016  . Type 2 diabetes mellitus with diabetic autonomic neuropathy, without long-term current use of insulin ([place]) 01/10/2016  . Peristomal hernia 04/19/2012  . Seborrheic dermatitis 08/09/2010  . PERSONAL HX RECTAL CANCER 06/05/2010  . Tobacco abuse 04/16/2010  . ALLERGIC RHINITIS 04/16/2010  . Peripheral neuropathy ([place]) 12/06/2009  . Essential hypertension 12/06/2009  . Diarrhea 12/06/2009    Current Outpatient Medications on File  Prior to Visit  Medication Sig Dispense Refill  . ALPRAZolam (XANAX) 0.5 MG tablet Take 1 tablet (0.5 mg total) by mouth 2 (two) times daily as needed for anxiety. 60 tablet 1  . blood glucose meter kit and supplies KIT Dispense based on patient and insurance preference. Use up to four times daily as directed. (FOR E11.9). 1 each 0  . fenofibrate (TRICOR) 145 MG tablet TAKE 1 TABLET BY MOUTH ONCE DAILY 30 tablet 11  . gabapentin (NEURONTIN) 600 MG tablet TAKE 1 TABLET BY MOUTH 3  TIMES DAILY 90 tablet 11  . hydrochlorothiazide (HYDRODIURIL) 25 MG tablet Take 1 tablet (25 mg total) by mouth daily. 90 tablet 1  . lisinopril (ZESTRIL) 10 MG tablet Take 1 tablet (10 mg total) by mouth daily. 90 tablet 1  . metFORMIN (GLUCOPHAGE) 500 MG tablet Take 1 tablet (500 mg total) by mouth 2 (two) times daily with a meal. 180 tablet 3  . naproxen sodium (ANAPROX) 220 MG tablet Take 440 mg by mouth 2 (two) times daily as needed (pain).     No current facility-administered medications on file prior to visit.    Past Medical History:  Diagnosis Date  . Adenomatous colon polyp   . Anemia, unspecified   . Cocaine abuse ([place])    as recently as 10/21/11  . Depression   . Difficulty sleeping    takes meds to sleep  . Diverticulosis   . Hernia 03/18/2013  . History of transfusion   .  Hypertension   . Incontinence of bowel    since colostomy  . Knee fracture, left   . 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 Nonspecific colitis 10/10/11  . Radiation proctitis    mild  . 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 Tibial plateau fracture, left 01/19/2014    Past Surgical History:  Procedure Laterality Date  . APPENDECTOMY N/A 01/09/2015   Procedure: APPENDECTOMY;  Surgeon: Excell Seltzer, MD;  [facility];  Service: General;  Laterality: N/A;  . COLOSTOMY  2013   [facility]  . FINGER FRACTURE SURGERY Left    and hand surgery  . HERNIA REPAIR    . LAPAROSCOPIC  PARASTOMAL HERNIA N/A 05/12/2013   Procedure: LAPAROSCOPIC PARASTOMAL HERNIA;  Surgeon: Leighton Ruff, MD;  [facility];  Service: General;  Laterality: N/A;  . LAPAROTOMY N/A 01/09/2015   Procedure: EXPLORATORY LAPAROTOMY;  Surgeon: Excell Seltzer, MD;  [facility];  Service: General;  Laterality: N/A;  RESECTION OF COLOSTOMY, CREATION OF NEW COLOSTOMY, VENTRAL HERNIA REPAIR WITH BIOLOGIC MESH,  LYSIS OF ADHESIONS , OMENTECTOMY  . LOW ANTERIOR BOWEL RESECTION  03/07/09  . MOUTH SURGERY  10/2009  . PORTACATH PLACEMENT     and removal    Social History   Socioeconomic History  . Marital status: Significant Other    Spouse name: Not on file  . Number of children: 0  . Years of education: Not on file  . Highest education level: Not on file  Occupational History  . Occupation: disabiled  Tobacco Use  . Smoking status: Current Every Day Smoker    Packs/day: 0.25    Years: 40.00    Pack years: 10.00    Types: Cigarettes  . Smokeless tobacco: Never Used  . Tobacco comment: Pt. in project free program, motivated to stop smoking completely. Declined smoking cessation [facility] program.  Vaping Use  . Vaping Use: Never used  Substance and Sexual Activity  . Alcohol use: Yes    Alcohol/week: 12.0 standard drinks    Types: 12 Standard drinks or equivalent per week    Comment: occasional  driink  . Drug use: Yes    Types: Marijuana, Cocaine    Comment: no longer using Marijuana; last use of cocaine nov 2014   . Sexual activity: Not on file  Other Topics Concern  . 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 Difficulty of Paying Living Expenses: Not hard at all  Food Insecurity: No Food Insecurity  . Worried About Charity fundraiser in the Last Year: Never true  . Ran Out of Food in the Last Year: Never true  Transportation Needs: No Transportation Needs  . Lack of Transportation (Medical): No  . Lack of Transportation (Non-Medical): No  Physical Activity: Sufficiently Active  . Days of Exercise per Week: 5 days  . Minutes of Exercise per Session: 30 min  Stress: Stress Concern Present  . Feeling of Stress : Rather much  Social Connections: Unknown  .  Frequency of Communication with Friends and Family: More than three times a week  . Frequency of Social Gatherings with Friends and Family: More than three times a week  . Attends Religious Services: Patient refused  . Active Member of Clubs or Organizations: Patient refused  . Attends Archivist Meetings: Patient refused  . Marital Status: Living with partner    Family History  Problem Relation Age of Onset  . Colon cancer Mother   . Diabetes Father   . Cirrhosis Brother   . Stomach cancer Sister   . Colon polyps Sister   . Pulmonary Hypertension Brother     Review of Systems  Constitutional: Negative for chills and fever.  Respiratory: Positive for cough (smokers cough). Negative for shortness of breath and wheezing.   Cardiovascular: Negative for chest pain, palpitations and leg swelling.  Neurological: Negative for light-headedness and headaches.       Objective:   Vitals:   01/13/20 1049  BP: 122/72  Pulse: 95  Temp: 98.5 F (36.9 C)  SpO2: 96%   BP Readings from Last 3 Encounters:  01/13/20 122/72  01/02/20 110/60  11/21/19 138/62   Wt Readings from Last 3 Encounters:  01/13/20 175 lb (79.4 kg)  01/02/20 179 lb (81.2 kg)  11/21/19 178 lb (80.7 kg)   Body mass index is 28.25 kg/m.   Physical Exam    Constitutional: Appears well-developed and  well-nourished. No distress.  HENT:  Head: Normocephalic and atraumatic.  Neck: Neck supple. No tracheal deviation present. No thyromegaly present.  No cervical lymphadenopathy Cardiovascular: Normal rate, regular rhythm and normal heart sounds.   2/6 systolic murmur heard. No carotid bruit .  No edema Pulmonary/Chest: Effort normal and breath sounds normal. No respiratory distress. No has no wheezes. No rales.  Skin: Skin is warm and dry. Not diaphoretic.  Psychiatric: Normal mood and affect. Behavior is normal.   Diabetic Foot Exam - Simple   Simple Foot Form Diabetic Foot exam was performed with the following findings: Yes 01/13/2020  1:06 PM  Visual Inspection No deformities, no ulcerations, no other skin breakdown bilaterally: Yes Sensation Testing See comments: Yes Pulse Check Posterior Tibialis and Dorsalis pulse intact bilaterally: Yes Comments Decreased sensation to light touch bilaterally       Assessment & Plan:    See Problem List for Assessment and Plan of chronic medical problems.    This visit occurred during the SARS-CoV-2 public health emergency.  Safety protocols were in place, including screening questions prior to the visit, additional usage of staff PPE, and extensive cleaning of exam room while observing appropriate contact time as indicated for disinfecting solutions.

## 2020-01-13 ENCOUNTER — Encounter: Payer: Self-pay | Admitting: Internal Medicine

## 2020-01-13 ENCOUNTER — Other Ambulatory Visit: Payer: Self-pay

## 2020-01-13 ENCOUNTER — Ambulatory Visit (INDEPENDENT_AMBULATORY_CARE_PROVIDER_SITE_OTHER): Payer: Medicare Other | Admitting: Internal Medicine

## 2020-01-13 VITALS — BP 122/72 | HR 95 | Temp 98.5°F | Wt 175.0 lb

## 2020-01-13 DIAGNOSIS — F17210 Nicotine dependence, cigarettes, uncomplicated: Secondary | ICD-10-CM | POA: Diagnosis not present

## 2020-01-13 DIAGNOSIS — G6289 Other specified polyneuropathies: Secondary | ICD-10-CM

## 2020-01-13 DIAGNOSIS — F1010 Alcohol abuse, uncomplicated: Secondary | ICD-10-CM

## 2020-01-13 DIAGNOSIS — F419 Anxiety disorder, unspecified: Secondary | ICD-10-CM

## 2020-01-13 DIAGNOSIS — I10 Essential (primary) hypertension: Secondary | ICD-10-CM

## 2020-01-13 DIAGNOSIS — E538 Deficiency of other specified B group vitamins: Secondary | ICD-10-CM

## 2020-01-13 DIAGNOSIS — I700 Atherosclerosis of aorta: Secondary | ICD-10-CM | POA: Diagnosis not present

## 2020-01-13 DIAGNOSIS — E1143 Type 2 diabetes mellitus with diabetic autonomic (poly)neuropathy: Secondary | ICD-10-CM

## 2020-01-13 DIAGNOSIS — Z720 Tobacco use: Secondary | ICD-10-CM

## 2020-01-13 DIAGNOSIS — Z85048 Personal history of other malignant neoplasm of rectum, rectosigmoid junction, and anus: Secondary | ICD-10-CM

## 2020-01-13 DIAGNOSIS — C19 Malignant neoplasm of rectosigmoid junction: Secondary | ICD-10-CM

## 2020-01-13 MED ORDER — CYANOCOBALAMIN 1000 MCG/ML IJ SOLN
1000.0000 ug | Freq: Once | INTRAMUSCULAR | Status: AC
  Administered 2020-01-13: 1000 ug via INTRAMUSCULAR

## 2020-01-13 MED ORDER — ROSUVASTATIN CALCIUM 5 MG PO TABS: 5.0000 mg | ORAL_TABLET | ORAL | 3 refills | Status: DC

## 2020-01-13 NOTE — Assessment & Plan Note (Signed)
Chronic Initially related to chemotherapy Her neuropathy has gotten worse and I discussed with her that her sugars and excessive alcohol use are likely making it worse Stressed the importance of lifestyle changes Continue gabapentin 600 mg 3 times daily-she did not tolerate a higher dose because of side effects

## 2020-01-13 NOTE — Assessment & Plan Note (Signed)
Chronic Anxiety level is high now because her partner is in the hospital Continue alprazolam 0.5 mg twice daily

## 2020-01-13 NOTE — Assessment & Plan Note (Signed)
Lab Results  Component Value Date   HGBA1C 6.5 (A) 11/21/2019   Chronic, controlled She has not been eating well recently and her sugars are likely higher than they should, but since they were well controlled last month we will hold off on checking blood work today Stressed Heritage manager diet, decrease alcohol intake, regular exercise Continue Metformin 500 mg twice daily

## 2020-01-13 NOTE — Assessment & Plan Note (Signed)
Chronic Monthly B12 injections-she will get one today

## 2020-01-13 NOTE — Assessment & Plan Note (Signed)
Chronic BP well controlled Continue hydrochlorothiazide 25 mg daily, lisinopril 10 mg daily cmp  

## 2020-01-13 NOTE — Assessment & Plan Note (Signed)
Smoking cessation was discussed for more than 3 minutes.  The patient was counseled on the dangers of tobacco use, and was advised to quit and reluctant to quit.  She knows she needs to quit.  Reviewed ways of quitting smoking including nicotine replacement, vapping/e-cigarettes, cold [place], weaning off cigarettes, and pharmacotherapy (wellbutrin and chantix).  She has tried nicotine patches in the gum in the past and they have not been effective.  Discussed possible side effects of the medications and she does not want to try that.  She wants to avoid more medications.  Stressed that she needs to work on this with lifestyle changes-decreasing stress, substituting this with something else.  Decrease alcohol intake, which will also help.  She will work on this more.

## 2020-01-13 NOTE — Assessment & Plan Note (Signed)
Chronic Due for colonoscopy-this is scheduled

## 2020-01-13 NOTE — Assessment & Plan Note (Signed)
Chronic Seen on imaging Taking fenofibrate 145 mg daily and we will continue this Start Crestor 5 mg twice daily since LDL is already on the low side.  Discussed the benefits of this medication with her for prevention

## 2020-01-13 NOTE — Assessment & Plan Note (Signed)
Chronic Current alcohol use is primarily on the weekends and she can drink a 12 pack or 3 bottles of wine on her own over the weekend Stressed that she needs to cut back on her alcohol use.  Discussed primarily that this is likely what is causing her peripheral neuropathy to be worse.  This did concern her and I do think she is going to cut back Discussed stress management We'll follow-up in 6 months, sooner if needed

## 2020-01-17 ENCOUNTER — Telehealth: Payer: Self-pay | Admitting: Internal Medicine

## 2020-01-17 DIAGNOSIS — Z933 Colostomy status: Secondary | ICD-10-CM

## 2020-01-17 NOTE — Telephone Encounter (Signed)
Patient called and is requesting a prescription be sent to [facility] in regards to her ostomy supplies. [place] is requesting a DX number.    Please call the pt: [phone]

## 2020-01-18 NOTE — Telephone Encounter (Signed)
printed

## 2020-01-22 ENCOUNTER — Other Ambulatory Visit: Payer: Self-pay | Admitting: Internal Medicine

## 2020-01-22 DIAGNOSIS — F419 Anxiety disorder, unspecified: Secondary | ICD-10-CM

## 2020-01-24 ENCOUNTER — Other Ambulatory Visit: Payer: Self-pay | Admitting: Internal Medicine

## 2020-01-24 DIAGNOSIS — F419 Anxiety disorder, unspecified: Secondary | ICD-10-CM

## 2020-01-24 NOTE — Telephone Encounter (Signed)
Paperwork faxed last week.

## 2020-01-25 IMAGING — MG DIGITAL SCREENING BILATERAL MAMMOGRAM WITH CAD
4 series · 4 of 4 positions shown · non-contrast
Comparison: Previous exam(s).

CLINICAL DATA: Screening.

EXAM:
DIGITAL SCREENING BILATERAL MAMMOGRAM WITH CAD

[R CC]
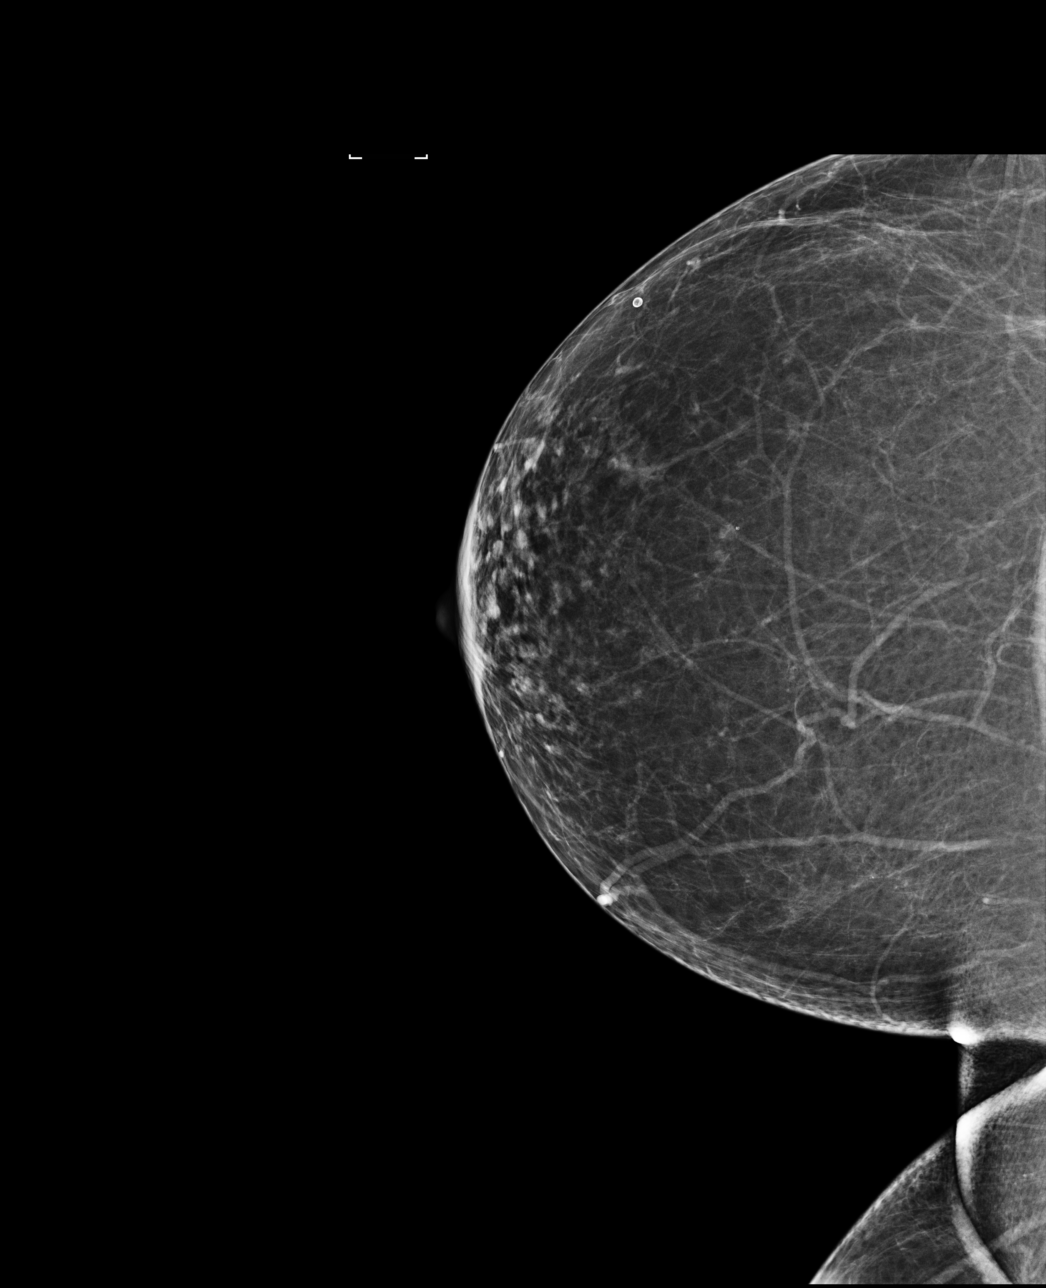

[L CC]
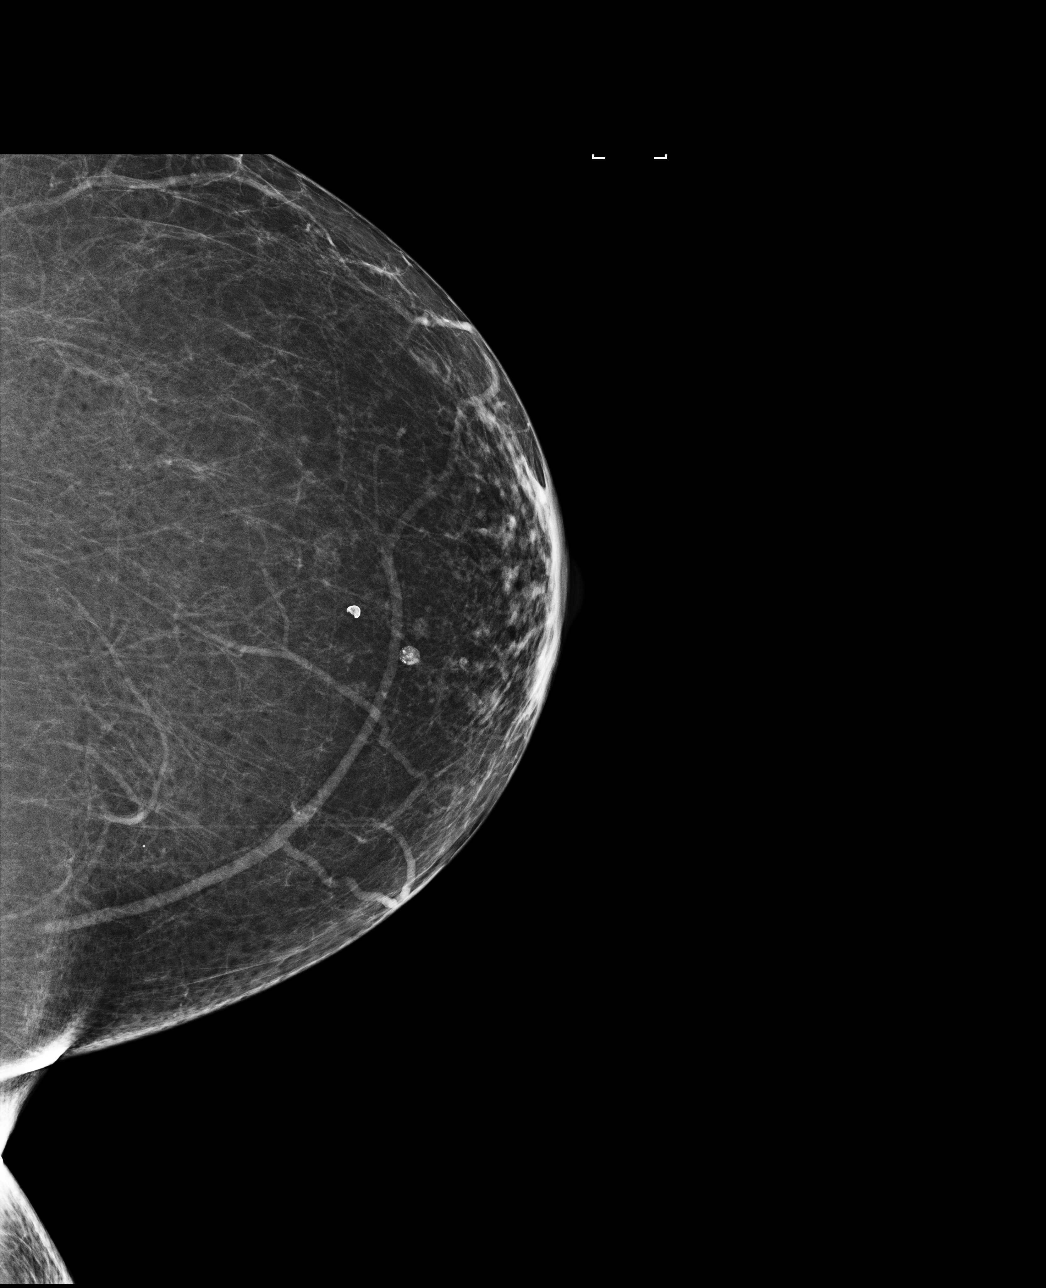

[L MLO]
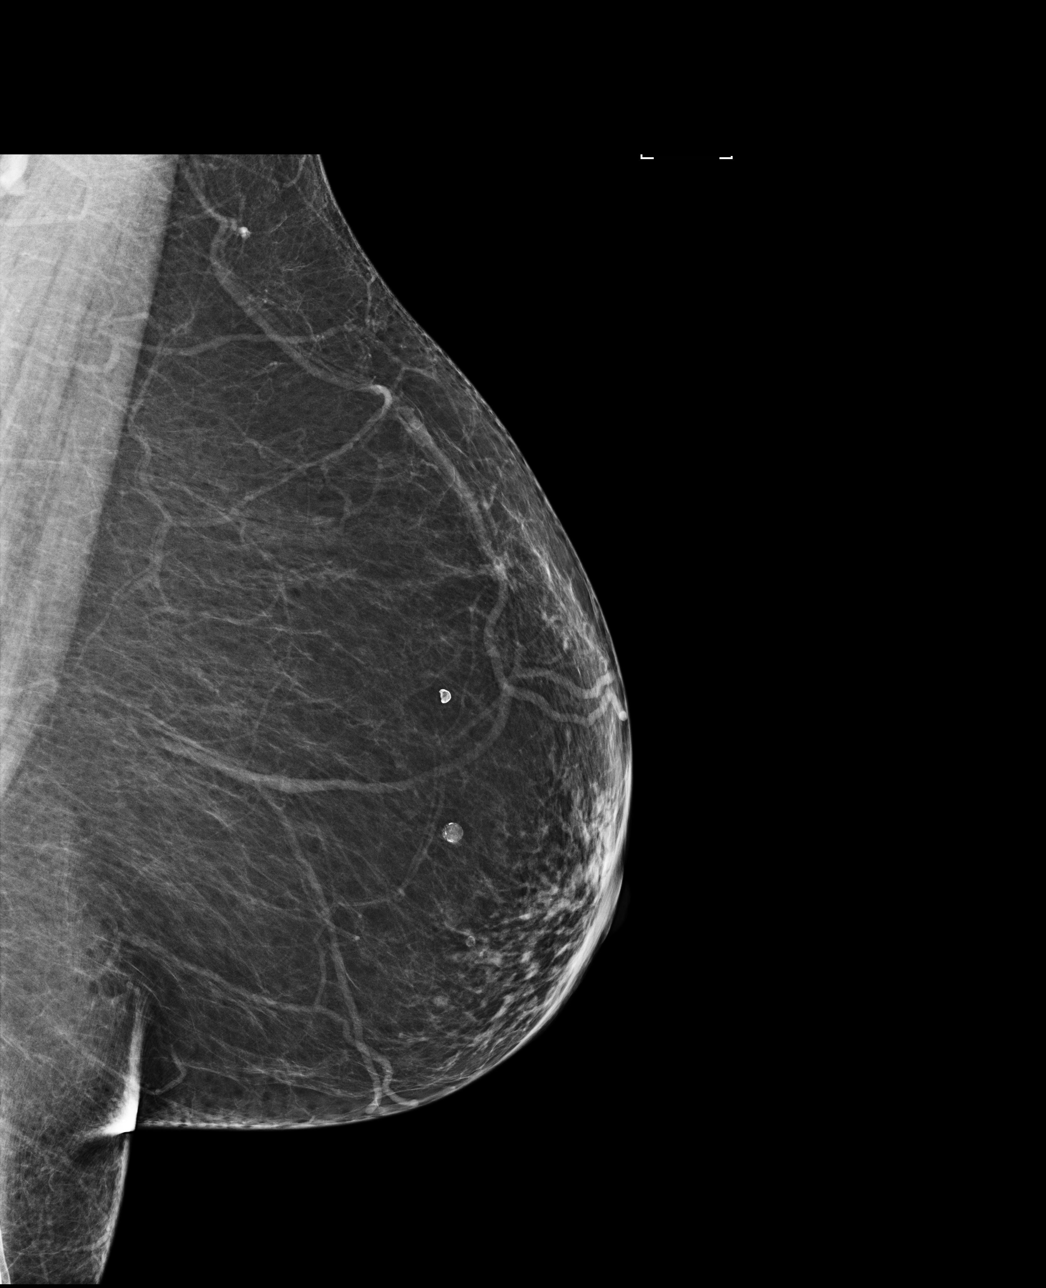

[R MLO]
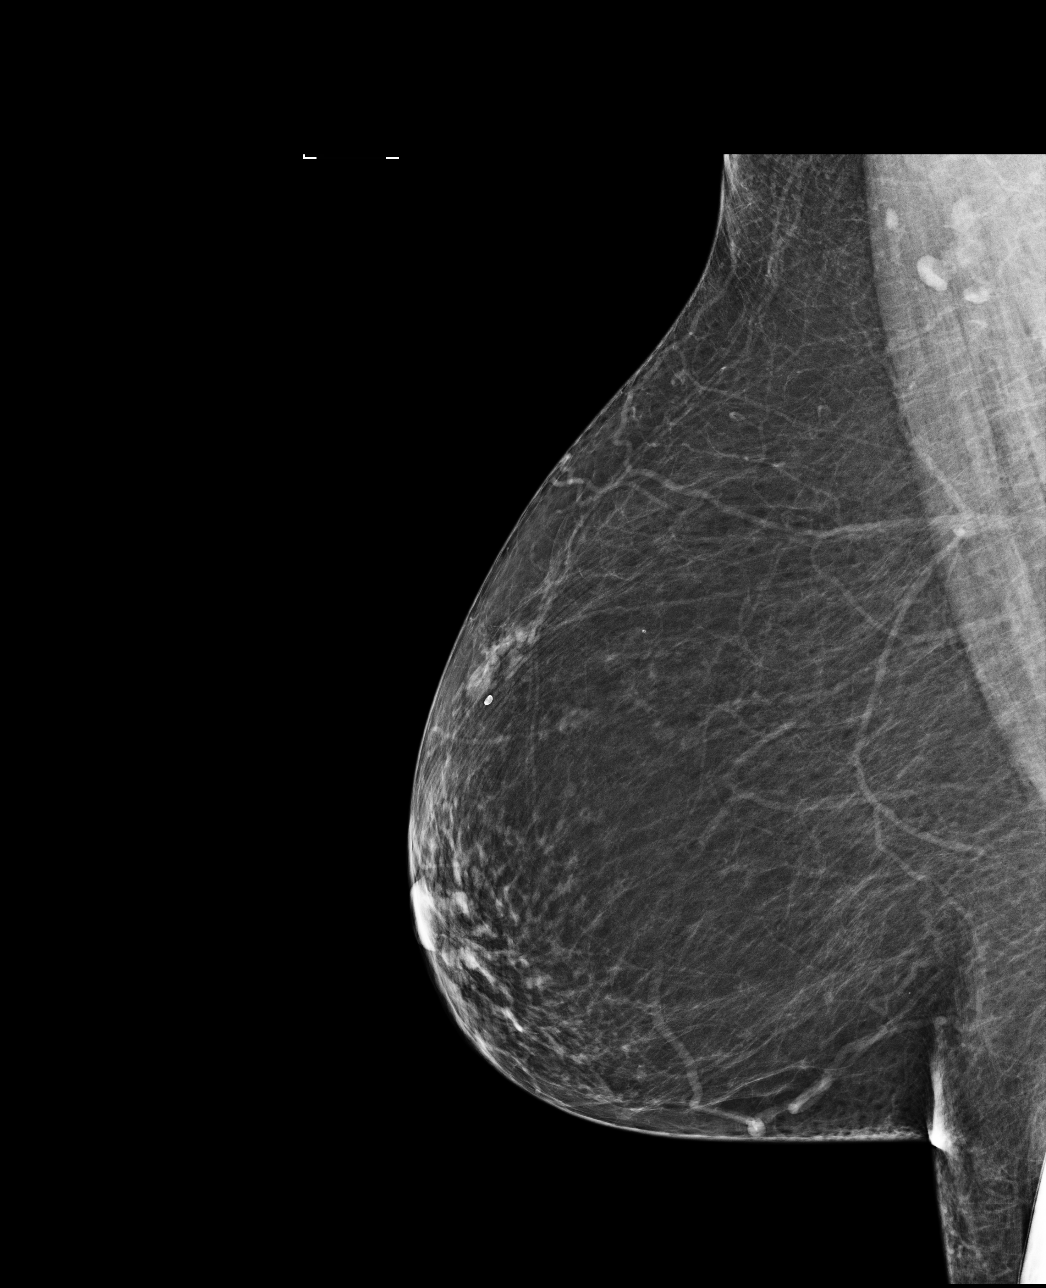

[4 of 4 positions shown; findings below may reference images not displayed]

ACR Breast Density Category b: There are scattered areas of
fibroglandular density.
FINDINGS: There are no findings suspicious for malignancy. Images were
processed with CAD.
IMPRESSION: No mammographic evidence of malignancy. A result letter of this
screening mammogram will be mailed directly to the patient.

RECOMMENDATION:
Screening mammogram in one year. (Code:AS-G-LCT)

BI-RADS CATEGORY  1: Negative.

## 2020-02-08 ENCOUNTER — Other Ambulatory Visit: Payer: Self-pay

## 2020-02-08 ENCOUNTER — Ambulatory Visit (AMBULATORY_SURGERY_CENTER): Payer: Self-pay | Admitting: *Deleted

## 2020-02-08 VITALS — Ht 66.0 in | Wt 177.0 lb

## 2020-02-08 DIAGNOSIS — Z85048 Personal history of other malignant neoplasm of rectum, rectosigmoid junction, and anus: Secondary | ICD-10-CM

## 2020-02-08 DIAGNOSIS — K529 Noninfective gastroenteritis and colitis, unspecified: Secondary | ICD-10-CM

## 2020-02-08 MED ORDER — SUTAB 1479-225-188 MG PO TABS: 1.0000 | ORAL_TABLET | ORAL | 0 refills | Status: DC

## 2020-02-08 NOTE — Progress Notes (Signed)
Patient is here in-person for PV. Patient denies any allergies to eggs or soy. Patient denies any problems with anesthesia/sedation. Patient denies any oxygen use at home. Patient denies taking any diet/weight loss medications or blood thinners. Patient is not being treated for MRSA or C-diff. Patient is aware of our care-partner policy and PPIRJ-18 safety protocol.  COVID-19 vaccines completed on 07/28/2019, per patient. Pt got tested for Covid yesterday she denies any covid symptoms, she is aware if that is positive to call our office and let us know.  Prep Prescription coupon given to the patient.

## 2020-02-09 ENCOUNTER — Telehealth: Payer: Self-pay | Admitting: Gastroenterology

## 2020-02-09 NOTE — Telephone Encounter (Signed)
Patient called states the pharmacy told her prep medication Sutab requires prior authorization

## 2020-02-09 NOTE — Telephone Encounter (Signed)
Called pt- got voice mail that identifies pt by name - LM that the Sutab coupon she received at the [facility] yesterday overrides any PA for the prep- LM she needs to take the Sutab coupon to the Pharmacy to get the prep at $40 or less   Also called [employer] and asked them run the coupon information sent in with the script as cash to drop the price to $40 - Pharmacy states they will run as cash as per [employer] attached

## 2020-02-10 ENCOUNTER — Encounter: Payer: Self-pay | Admitting: Gastroenterology

## 2020-02-13 NOTE — Progress Notes (Deleted)
Subjective:    Patient ID: [name], female    DOB: 1955-09-22, 64 y.o.   MRN: [id number]  HPI The patient is here for an acute visit.   Right foot/leg pain -   Medications and allergies reviewed with patient and updated if appropriate.  Patient Active Problem List   Diagnosis Date Noted  . Alcohol abuse   . Subconjunctival hemorrhage, traumatic, left 11/21/2019  . Hypertriglyceridemia 06/13/2019  . Aortic atherosclerosis ([place]) 06/13/2019  . 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place [facility]) 12/13/2018  . B12 deficiency 06/15/2016  . Anxiety 06/12/2016  . Type 2 diabetes mellitus with diabetic autonomic neuropathy, without long-term current use of insulin ([place]) 01/10/2016  . Peristomal hernia 04/19/2012  . Seborrheic dermatitis 08/09/2010  . PERSONAL HX RECTAL CANCER 06/05/2010  . Tobacco abuse 04/16/2010  . ALLERGIC RHINITIS 04/16/2010  . Peripheral neuropathy ([place]) 12/06/2009  . Essential hypertension 12/06/2009  . Diarrhea 12/06/2009    Current Outpatient Medications on File Prior to Visit  Medication Sig Dispense Refill  . ALPRAZolam (XANAX) 0.5 MG tablet Take 1 tablet (0.5 mg total) by mouth 2 (two) times daily as needed for anxiety. 60 tablet 1  . blood glucose meter kit and supplies KIT Dispense based on patient and insurance preference. Use up to four times daily as directed. (FOR E11.9). 1 each 0  . fenofibrate (TRICOR) 145 MG tablet TAKE 1 TABLET BY MOUTH ONCE DAILY 30 tablet 11  . gabapentin (NEURONTIN) 600 MG tablet TAKE 1 TABLET BY MOUTH 3  TIMES DAILY 90 tablet 11  . hydrochlorothiazide (HYDRODIURIL) 25 MG tablet Take 1 tablet (25 mg total) by mouth daily. 90 tablet 1  . lisinopril (ZESTRIL) 10 MG tablet Take 1 tablet (10 mg total) by mouth daily. 90 tablet 1  . metFORMIN (GLUCOPHAGE) 500 MG tablet Take 1 tablet (500 mg total) by mouth 2 (two) times daily with a meal. 180 tablet 3  . naproxen sodium (ANAPROX) 220 MG tablet Take 440 mg by mouth 2 (two) times daily as  needed (pain).    . rosuvastatin (CRESTOR) 5 MG tablet Take 1 tablet (5 mg total) by mouth 2 (two) times a week. 13 tablet 3  . Sodium Sulfate-Mag Sulfate-KCl (SUTAB) [phone] MG TABS Take 1 kit by mouth as directed. MANUFACTURER CODES!! BIN: [id number] PCN: CN GROUP: [id number] MEMBER ID: [id number];XAJ AS CASH;NO PRIOR AUTHORIZATION 24 tablet 0   No current facility-administered medications on file prior to visit.    Past Medical History:  Diagnosis Date  . Adenomatous colon polyp   . Anemia, unspecified   . Cocaine abuse ([place])    as recently as 10/21/11  . Depression   . Difficulty sleeping    takes meds to sleep  . Diverticulosis   . Hernia 03/18/2013  . History of transfusion   . Hypertension   . Incontinence of bowel    since colostomy  . Knee fracture, left   . 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 Nonspecific colitis 10/10/11  . Radiation proctitis    mild  . 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 Tibial plateau fracture, left 01/19/2014    Past Surgical History:  Procedure Laterality Date  . APPENDECTOMY N/A 01/09/2015   Procedure: APPENDECTOMY;  Surgeon: Excell Seltzer, MD;  Location: [facility];  Service: General;  Laterality: N/A;  . COLONOSCOPY  01/09/2015   [facility]  . COLOSTOMY  2013   [facility]  . FINGER FRACTURE SURGERY Left  and hand surgery  . HERNIA REPAIR    . LAPAROSCOPIC PARASTOMAL HERNIA N/A 05/12/2013   Procedure: LAPAROSCOPIC PARASTOMAL HERNIA;  Surgeon: Leighton Ruff, MD;  Location: [facility];  Service: General;  Laterality: N/A;  . LAPAROTOMY N/A 01/09/2015   Procedure: EXPLORATORY LAPAROTOMY;  Surgeon: Excell Seltzer, MD;  Location: [facility];  Service: General;  Laterality: N/A;  RESECTION OF COLOSTOMY, CREATION OF NEW COLOSTOMY, VENTRAL HERNIA REPAIR WITH BIOLOGIC MESH,  LYSIS OF ADHESIONS , OMENTECTOMY  . LOW ANTERIOR BOWEL RESECTION  03/07/09  . MOUTH SURGERY  10/2009  . PORTACATH PLACEMENT     and removal     Social History   Socioeconomic History  . Marital status: Significant Other    Spouse name: Not on file  . Number of children: 0  . Years of education: Not on file  . Highest education level: Not on file  Occupational History  . Occupation: disabiled  Tobacco Use  . Smoking status: Current Every Day Smoker    Packs/day: 0.25    Years: 40.00    Pack years: 10.00    Types: Cigarettes  . Smokeless tobacco: Never Used  . Tobacco comment: Pt. in project free program, motivated to stop smoking completely. Declined smoking cessation cone program.  Vaping Use  . Vaping Use: Never used  Substance and Sexual Activity  . Alcohol use: Yes    Alcohol/week: 18.0 standard drinks    Types: 14 Cans of beer, 4 Glasses of wine per week  . Drug use: Yes    Frequency: 1.0 times per week    Types: Marijuana, Cocaine    Comment: Pt is using Marijuana; last use of cocaine nov 2014   . Sexual activity: Not on file  Other Topics Concern  . 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 Difficulty of Paying Living Expenses: Not hard at all  Food Insecurity: No Food Insecurity  . Worried About Charity fundraiser in the Last Year: Never true  . Ran Out of Food in the Last Year: Never true  Transportation Needs: No Transportation Needs  . Lack of Transportation (Medical): No  . Lack of Transportation (Non-Medical): No  Physical Activity: Sufficiently Active  . Days of Exercise per Week: 5 days  . Minutes of Exercise per Session: 30 min  Stress: Stress Concern Present  .  Feeling of Stress : Rather much  Social Connections: Unknown  . Frequency of Communication with Friends and Family: More than three times a week  . Frequency of Social Gatherings with Friends and Family: More than three times a week  . Attends Religious Services: Patient refused  . Active Member of Clubs or Organizations: Patient refused  . Attends Archivist Meetings: Patient refused  . Marital Status: Living with partner    Family History  Problem Relation Age of Onset  . Colon cancer Mother 55  . Diabetes Father   . Cirrhosis Brother   . Stomach cancer Sister   . Colon polyps Sister   . Pulmonary Hypertension Brother   .  Esophageal cancer Neg Hx   . Rectal cancer Neg Hx     Review of Systems     Objective:  There were no vitals filed for this visit. BP Readings from Last 3 Encounters:  01/13/20 122/72  01/02/20 110/60  11/21/19 138/62   Wt Readings from Last 3 Encounters:  02/08/20 177 lb (80.3 kg)  01/13/20 175 lb (79.4 kg)  01/02/20 179 lb (81.2 kg)   There is no height or weight on file to calculate BMI.   Physical Exam         Assessment & Plan:    See Problem List for Assessment and Plan of chronic medical problems.    This visit occurred during the SARS-CoV-2 public health emergency.  Safety protocols were in place, including screening questions prior to the visit, additional usage of staff PPE, and extensive cleaning of exam room while observing appropriate contact time as indicated for disinfecting solutions.

## 2020-02-14 ENCOUNTER — Ambulatory Visit: Payer: Medicare Other | Admitting: Internal Medicine

## 2020-02-14 NOTE — Progress Notes (Signed)
Subjective:    Patient ID: [name], female    DOB: [birth year], 64 y.o.   MRN: [id number]  HPI The patient is here for an acute visit.   Right foot/leg pain - her pain started 2 weeks ago - it woke her up.  The next morning she could not stand on her rght foot.  It last one day.  Last Friday the same thing happened.  It lasted two days.  It was focused at the ankle and it was swollen, warm.  She took tylenol and that did not help much.   No fever, injuries.   Medications and allergies reviewed with patient and updated if appropriate.  Patient Active Problem List   Diagnosis Date Noted  . Acute gout of right ankle 02/15/2020  . Alcohol abuse   . Subconjunctival hemorrhage, traumatic, left 11/21/2019  . Hypertriglyceridemia 06/13/2019  . Aortic atherosclerosis ([place]) 06/13/2019  . 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place [facility]) 12/13/2018  . B12 deficiency 06/15/2016  . Anxiety 06/12/2016  . Type 2 diabetes mellitus with diabetic autonomic neuropathy, without long-term current use of insulin ([place]) 01/10/2016  . Peristomal hernia 04/19/2012  . Seborrheic dermatitis 08/09/2010  . PERSONAL HX RECTAL CANCER 06/05/2010  . Tobacco abuse 04/16/2010  . ALLERGIC RHINITIS 04/16/2010  . Peripheral neuropathy ([place]) 12/06/2009  . Essential hypertension 12/06/2009  . Diarrhea 12/06/2009    Current Outpatient Medications on File Prior to Visit  Medication Sig Dispense Refill  . ALPRAZolam (XANAX) 0.5 MG tablet Take 1 tablet (0.5 mg total) by mouth 2 (two) times daily as needed for anxiety. 60 tablet 1  . blood glucose meter kit and supplies KIT Dispense based on patient and insurance preference. Use up to four times daily as directed. (FOR E11.9). 1 each 0  . fenofibrate (TRICOR) 145 MG tablet TAKE 1 TABLET BY MOUTH ONCE DAILY 30 tablet 11  . gabapentin (NEURONTIN) 600 MG tablet TAKE 1 TABLET BY MOUTH 3  TIMES DAILY 90 tablet 11  . hydrochlorothiazide (HYDRODIURIL) 25 MG tablet Take 1 tablet (25 mg  total) by mouth daily. 90 tablet 1  . lisinopril (ZESTRIL) 10 MG tablet Take 1 tablet (10 mg total) by mouth daily. 90 tablet 1  . metFORMIN (GLUCOPHAGE) 500 MG tablet Take 1 tablet (500 mg total) by mouth 2 (two) times daily with a meal. 180 tablet 3  . naproxen sodium (ANAPROX) 220 MG tablet Take 440 mg by mouth 2 (two) times daily as needed (pain).    . rosuvastatin (CRESTOR) 5 MG tablet Take 1 tablet (5 mg total) by mouth 2 (two) times a week. 13 tablet 3  . Sodium Sulfate-Mag Sulfate-KCl (SUTAB) [phone] MG TABS Take 1 kit by mouth as directed. MANUFACTURER CODES!! BIN: [id number] PCN: CN GROUP: [id number] MEMBER ID: [id number];ITG AS CASH;NO PRIOR AUTHORIZATION 24 tablet 0   No current facility-administered medications on file prior to visit.    Past Medical History:  Diagnosis Date  . Adenomatous colon polyp   . Anemia, unspecified   . Cocaine abuse ([place])    as recently as 10/21/11  . Depression   . Difficulty sleeping    takes meds to sleep  . Diverticulosis   . Hernia 03/18/2013  . History of transfusion   . Hypertension   . Incontinence of bowel    since colostomy  . Knee fracture, left   . 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 Nonspecific colitis 10/10/11  . Radiation proctitis    mild  . Rectosigmoid  cancer ([place]) 10/2008 dx   LAR surg 02/2009, chemo thru 09/2009  . Tibial plateau fracture, left 01/19/2014    Past Surgical History:  Procedure Laterality Date  . APPENDECTOMY N/A 01/09/2015   Procedure: APPENDECTOMY;  Surgeon: Excell Seltzer, MD;  [facility];  Service: General;  Laterality: N/A;  . COLONOSCOPY  01/09/2015   [facility]  . COLOSTOMY  2013   [facility]  . FINGER FRACTURE SURGERY Left    and hand surgery  . HERNIA REPAIR    . LAPAROSCOPIC PARASTOMAL HERNIA N/A 05/12/2013   Procedure: LAPAROSCOPIC PARASTOMAL HERNIA;  Surgeon: Leighton Ruff, MD;  [facility];  Service: General;  Laterality: N/A;  . LAPAROTOMY N/A 01/09/2015   Procedure:  EXPLORATORY LAPAROTOMY;  Surgeon: Excell Seltzer, MD;  Location: [facility];  Service: General;  Laterality: N/A;  RESECTION OF COLOSTOMY, CREATION OF NEW COLOSTOMY, VENTRAL HERNIA REPAIR WITH BIOLOGIC MESH,  LYSIS OF ADHESIONS , OMENTECTOMY  . LOW ANTERIOR BOWEL RESECTION  03/07/09  . MOUTH SURGERY  10/2009  . PORTACATH PLACEMENT     and removal    Social History   Socioeconomic History  . Marital status: Significant Other    Spouse name: Not on file  . Number of children: 0  . Years of education: Not on file  . Highest education level: Not on file  Occupational History  . Occupation: disabiled  Tobacco Use  . Smoking status: Current Every Day Smoker    Packs/day: 0.25    Years: 40.00    Pack years: 10.00    Types: Cigarettes  . Smokeless tobacco: Never Used  . Tobacco comment: Pt. in project free program, motivated to stop smoking completely. Declined smoking cessation cone program.  Vaping Use  . Vaping Use: Never used  Substance and Sexual Activity  . Alcohol use: Yes    Alcohol/week: 18.0 standard drinks    Types: 14 Cans of beer, 4 Glasses of wine per week  . Drug use: Yes    Frequency: 1.0 times per week    Types: Marijuana, Cocaine    Comment: Pt is using Marijuana; last use of cocaine nov 2014   . Sexual activity: Not on file  Other Topics Concern  . 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 Difficulty of Paying Living Expenses: Not hard at all  Food Insecurity: No Food Insecurity  . Worried About Charity fundraiser in the Last Year: Never  true  . Ran Out of Food in the Last Year: Never true  Transportation Needs: No Transportation Needs  . Lack of Transportation (Medical): No  . Lack of Transportation (Non-Medical): No  Physical Activity: Sufficiently Active  . Days of Exercise per Week: 5 days  . Minutes of Exercise per Session: 30 min  Stress: Stress Concern Present  . Feeling of Stress : Rather much  Social Connections: Unknown  . Frequency of Communication with Friends and Family: More than three times a week  . Frequency of Social Gatherings with Friends and Family: More than three times a week  .  Attends Religious Services: Patient refused  . Active Member of Clubs or Organizations: Patient refused  . Attends Archivist Meetings: Patient refused  . Marital Status: Living with partner    Family History  Problem Relation Age of Onset  . Colon cancer Mother 54  . Diabetes Father   . Cirrhosis Brother   . Stomach cancer Sister   . Colon polyps Sister   . Pulmonary Hypertension Brother   . Esophageal cancer Neg Hx   . Rectal cancer Neg Hx     Review of Systems Per HPI    Objective:   Vitals:   02/15/20 1552  BP: 130/72  Pulse: 92  Temp: 98.4 F (36.9 C)  SpO2: 97%   BP Readings from Last 3 Encounters:  02/15/20 130/72  01/13/20 122/72  01/02/20 110/60   Wt Readings from Last 3 Encounters:  02/15/20 176 lb (79.8 kg)  02/08/20 177 lb (80.3 kg)  01/13/20 175 lb (79.4 kg)   Body mass index is 28.41 kg/m.   Physical Exam Constitutional:      General: She is not in acute distress.    Appearance: Normal appearance. She is not ill-appearing.  HENT:     Head: Normocephalic and atraumatic.  Musculoskeletal:     Comments: Right ankle without swelling, erythema or warmth.  Full range of motion without discomfort.  Normal sensation in ankle  Skin:    General: Skin is warm and dry.  Neurological:     Mental Status: She is alert.            Assessment & Plan:    See Problem  List for Assessment and Plan of chronic medical problems.    This visit occurred during the SARS-CoV-2 public health emergency.  Safety protocols were in place, including screening questions prior to the visit, additional usage of staff PPE, and extensive cleaning of exam room while observing appropriate contact time as indicated for disinfecting solutions.

## 2020-02-15 ENCOUNTER — Other Ambulatory Visit: Payer: Self-pay

## 2020-02-15 ENCOUNTER — Ambulatory Visit (INDEPENDENT_AMBULATORY_CARE_PROVIDER_SITE_OTHER): Payer: Medicare Other | Admitting: Internal Medicine

## 2020-02-15 ENCOUNTER — Encounter: Payer: Self-pay | Admitting: Internal Medicine

## 2020-02-15 VITALS — BP 130/72 | HR 92 | Temp 98.4°F | Ht 66.0 in | Wt 176.0 lb

## 2020-02-15 DIAGNOSIS — Z8739 Personal history of other diseases of the musculoskeletal system and connective tissue: Secondary | ICD-10-CM | POA: Insufficient documentation

## 2020-02-15 DIAGNOSIS — M109 Gout, unspecified: Secondary | ICD-10-CM

## 2020-02-15 MED ORDER — COLCHICINE 0.6 MG PO TABS: ORAL_TABLET | ORAL | 1 refills | Status: DC

## 2020-02-15 NOTE — Assessment & Plan Note (Signed)
Acute Right ankle Had two recent episodes - resolved Check uric acid level, esr No evidence of gout on exam so no need to treat Discussed preventative medication with allopurinol versus as needed medication and she wants to do as needed medication Colchicine 1.2 mg x 1, 1 tab 1 hour later as needed for gout flare Discussed that if she has frequent flares we need to start the preventative medication Discussed common causes of gout and information given

## 2020-02-15 NOTE — Patient Instructions (Signed)
Blood work was ordered.     Medications reviewed and updated.  Changes include :   Colchicine as needed for gout flare  Your prescription(s) have been submitted to your pharmacy. Please take as directed and contact our office if you believe you are having problem(s) with the medication(s).     Gout  Gout is a condition that causes painful swelling of the joints. Gout is a type of inflammation of the joints (arthritis). This condition is caused by having too much uric acid in the body. Uric acid is a chemical that forms when the body breaks down substances called purines. Purines are important for building body proteins. When the body has too much uric acid, sharp crystals can form and build up inside the joints. This causes pain and swelling. Gout attacks can happen quickly and may be very painful (acute gout). Over time, the attacks can affect more joints and become more frequent (chronic gout). Gout can also cause uric acid to build up under the skin and inside the kidneys. What are the causes? This condition is caused by too much uric acid in your blood. This can happen because:  Your kidneys do not remove enough uric acid from your blood. This is the most common cause.  Your body makes too much uric acid. This can happen with some cancers and cancer treatments. It can also occur if your body is breaking down too many red blood cells (hemolytic anemia).  You eat too many foods that are high in purines. These foods include organ meats and some seafood. Alcohol, especially beer, is also high in purines. A gout attack may be triggered by trauma or stress. What increases the risk? You are more likely to develop this condition if you:  Have a family history of gout.  Are female and middle-aged.  Are female and have gone through menopause.  Are obese.  Frequently drink alcohol, especially beer.  Are dehydrated.  Lose weight too quickly.  Have an organ transplant.  Have lead  poisoning.  Take certain medicines, including aspirin, cyclosporine, diuretics, levodopa, and niacin.  Have kidney disease.  Have a skin condition called psoriasis. What are the signs or symptoms? An attack of acute gout happens quickly. It usually occurs in just one joint. The most common place is the big toe. Attacks often start at night. Other joints that may be affected include joints of the feet, ankle, knee, fingers, wrist, or elbow. Symptoms of this condition may include:  Severe pain.  Warmth.  Swelling.  Stiffness.  Tenderness. The affected joint may be very painful to touch.  Shiny, red, or purple skin.  Chills and fever. Chronic gout may cause symptoms more frequently. More joints may be involved. You may also have white or yellow lumps (tophi) on your hands or feet or in other areas near your joints. How is this diagnosed? This condition is diagnosed based on your symptoms, medical history, and physical exam. You may have tests, such as:  Blood tests to measure uric acid levels.  Removal of joint fluid with a thin needle (aspiration) to look for uric acid crystals.  X-rays to look for joint damage. How is this treated? Treatment for this condition has two phases: treating an acute attack and preventing future attacks. Acute gout treatment may include medicines to reduce pain and swelling, including:  NSAIDs.  Steroids. These are strong anti-inflammatory medicines that can be taken by mouth (orally) or injected into a joint.  Colchicine. This medicine relieves  pain and swelling when it is taken soon after an attack. It can be given by mouth or through an IV. Preventive treatment may include:  Daily use of smaller doses of NSAIDs or colchicine.  Use of a medicine that reduces uric acid levels in your blood.  Changes to your diet. You may need to see a dietitian about what to eat and drink to prevent gout. Follow these instructions at home: During a gout  attack   If directed, put ice on the affected area: ? Put ice in a plastic bag. ? Place a towel between your skin and the bag. ? Leave the ice on for 20 minutes, 2-3 times a day.  Raise (elevate) the affected joint above the level of your heart as often as possible.  Rest the joint as much as possible. If the affected joint is in your leg, you may be given crutches to use.  Follow instructions from your health care provider about eating or drinking restrictions. Avoiding future gout attacks  Follow a low-purine diet as told by your dietitian or health care provider. Avoid foods and drinks that are high in purines, including liver, kidney, anchovies, asparagus, herring, mushrooms, mussels, and beer.  Maintain a healthy weight or lose weight if you are overweight. If you want to lose weight, talk with your health care provider. It is important that you do not lose weight too quickly.  Start or maintain an exercise program as told by your health care provider. Eating and drinking  Drink enough fluids to keep your urine pale yellow.  If you drink alcohol: ? Limit how much you use to:  0-1 drink a day for women.  0-2 drinks a day for men. ? Be aware of how much alcohol is in your drink. In the [place] one drink equals one 12 oz bottle of beer (355 mL) one 5 oz glass of wine (148 mL), or one 1 oz glass of hard liquor (44 mL). General instructions  Take over-the-counter and prescription medicines only as told by your health care provider.  Do not drive or use heavy machinery while taking prescription pain medicine.  Return to your normal activities as told by your health care provider. Ask your health care provider what activities are safe for you.  Keep all follow-up visits as told by your health care provider. This is important. Contact a health care provider if you have:  Another gout attack.  Continuing symptoms of a gout attack after 10 days of treatment.  Side effects from  your medicines.  Chills or a fever.  Burning pain when you urinate.  Pain in your lower back or belly. Get help right away if you:  Have severe or uncontrolled pain.  Cannot urinate. Summary  Gout is painful swelling of the joints caused by inflammation.  The most common site of pain is the big toe, but it can affect other joints in the body.  Medicines and dietary changes can help to prevent and treat gout attacks. This information is not intended to replace advice given to you by your health care provider. Make sure you discuss any questions you have with your health care provider. Document Revised: 10/21/2017 Document Reviewed: 10/21/2017 Elsevier Patient Education  [place].

## 2020-02-16 ENCOUNTER — Encounter: Payer: Self-pay | Admitting: Internal Medicine

## 2020-02-16 DIAGNOSIS — E790 Hyperuricemia without signs of inflammatory arthritis and tophaceous disease: Secondary | ICD-10-CM | POA: Insufficient documentation

## 2020-02-16 LAB — URIC ACID: Uric Acid, Serum: 8.2 mg/dL — ABNORMAL HIGH (ref 2.4–7.0)

## 2020-02-16 LAB — SEDIMENTATION RATE: Sed Rate: 23 mm/hr (ref 0–30)

## 2020-02-20 ENCOUNTER — Telehealth: Payer: Self-pay | Admitting: Internal Medicine

## 2020-02-20 DIAGNOSIS — F419 Anxiety disorder, unspecified: Secondary | ICD-10-CM

## 2020-02-20 MED ORDER — ALPRAZOLAM 0.5 MG PO TABS: 0.5000 mg | ORAL_TABLET | Freq: Two times a day (BID) | ORAL | 1 refills | Status: DC | PRN

## 2020-02-20 NOTE — Telephone Encounter (Signed)
Info printed out and sent to [facility] with [facility] supply. Fax conformation received.

## 2020-02-20 NOTE — Telephone Encounter (Signed)
ALPRAZolam (XANAX) 0.5 MG tablet Patient calling stating [facility] stating they wont refill this medication until December because its too early. Patient said its due next week and doesn't understand why they wont fill it until then  [place], [address], [address] Phone:  [phone]  Fax:  [phone]

## 2020-02-20 NOTE — Telephone Encounter (Signed)
    Patient calling to request refill for ALPRAZolam Duanne Moron) 0.5 MG tablet to [place], [place], [address]

## 2020-02-20 NOTE — Telephone Encounter (Signed)
   Patient states [facility] is still missing code for ostomy supplies.  Requesting fax be resent, attn: [place]

## 2020-02-21 NOTE — Telephone Encounter (Signed)
It looks like it was filled yesterday - maybe they are mailing a prescription to her.

## 2020-03-01 ENCOUNTER — Ambulatory Visit (AMBULATORY_SURGERY_CENTER): Payer: Medicare Other | Admitting: Gastroenterology

## 2020-03-01 ENCOUNTER — Other Ambulatory Visit: Payer: Self-pay

## 2020-03-01 ENCOUNTER — Telehealth: Payer: Self-pay | Admitting: Gastroenterology

## 2020-03-01 ENCOUNTER — Encounter: Payer: Self-pay | Admitting: Gastroenterology

## 2020-03-01 VITALS — BP 116/57 | HR 68 | Temp 97.5°F | Resp 17 | Ht 66.0 in | Wt 177.0 lb

## 2020-03-01 DIAGNOSIS — D123 Benign neoplasm of transverse colon: Secondary | ICD-10-CM

## 2020-03-01 DIAGNOSIS — Z85048 Personal history of other malignant neoplasm of rectum, rectosigmoid junction, and anus: Secondary | ICD-10-CM | POA: Diagnosis not present

## 2020-03-01 DIAGNOSIS — C20 Malignant neoplasm of rectum: Secondary | ICD-10-CM

## 2020-03-01 MED ORDER — SODIUM CHLORIDE 0.9 % IV SOLN: 500.0000 mL | INTRAVENOUS | Status: DC

## 2020-03-01 NOTE — Progress Notes (Signed)
A and O x3. Report to RN. Tolerated MAC anesthesia well.

## 2020-03-01 NOTE — Telephone Encounter (Signed)
[facility] MD: [place] After hours call: Cramping and discharge  Patient called after her colonoscopy with Dr. Havery Moros today. His procedure note was reviewed.  Transverse colostomy anatomy noted.   She felt well at the time of discharge. After arriving home and eating a light meal of mashed potatoes, she developed abdominal cramping that feels like gas and has noted some some stool output from her rectum in her underwear. Ostomy output is normal. She was concerned about perforation given her prior history. No fevers or chills.  Notes a history of intermittent rectal output but it is usually mucous. On rare occassions will she experience stool smears.    We discussed her procedure in detail. Strategies for managing gas cramps noted.  She will seek ER evaluation if the cramping persists or worsens, if she develops a fever, or has any other additional questions or concerns this evening.

## 2020-03-01 NOTE — Progress Notes (Signed)
Vs CW  Pt's states no medical or surgical changes since previsit or office visit.  

## 2020-03-01 NOTE — Progress Notes (Signed)
Called to room to assist during endoscopic procedure.  Patient ID and intended procedure confirmed with present staff. Received instructions for my participation in the procedure from the performing physician.  

## 2020-03-01 NOTE — Op Note (Signed)
[place] Patient Name: [name] Procedure Date: 03/01/2020 10:25 AM MRN: [id number] Endoscopist: Remo Lipps P. 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 The patient's tolerance of                            previous anesthesia was also reviewed. The risks                            and benefits of the procedure and the sedation                            options and risks were discussed with the patient.                            All questions were answered, and informed consent                            was obtained. Prior Anticoagulants: The patient has  taken no previous anticoagulant or antiplatelet                            agents. ASA Grade Assessment: III - A patient with                            severe systemic disease. After reviewing the risks                            and benefits, the patient was deemed in                            satisfactory condition to undergo the procedure.                           After obtaining informed consent, the  colonoscope                            was passed under direct vision. Throughout the                            procedure, the patient's blood pressure, pulse, and                            oxygen saturations were monitored continuously. The                            Endoscope was introduced through the transverse                            colostomy and advanced to the the terminal ileum,                            with identification of the appendiceal orifice and                            IC valve. The colonoscopy was performed without                            difficulty. The patient tolerated the procedure                            well. The quality of the bowel preparation was                            good. The terminal ileum, the ileocecal valve and                            the appendiceal orifice were photographed. Scope In: 10:56:10 AM Scope Out: 11:09:41 AM Scope Withdrawal Time: 0 hours 6 minutes 43 seconds  Total Procedure Duration: 0 hours 13 minutes 31 seconds  Findings:                 The terminal ileum appeared normal.  Multiple medium-mouthed diverticula were found in                            the transverse colon, proximal transverse colon,                            ascending colon and cecum.                           A 3 mm polyp was found in the transverse colon. The                            polyp was sessile. The polyp was removed with a                            cold biopsy forceps. Resection and retrieval were                            complete.                           The exam was otherwise without abnormality.                            Overall, the ostomy looks normal. The proximal limb                            evaluated the proximal transverse colon, ascending                            colon and cecum. It was tortous with some                            restricted mobility likely due to adhesive disease.                             Patient has large ventral hernia on the left. I                            could not see the lumen of the distal limb through                            the colostomy to evaluate the left colon, the                            entrance to it is quite angulated and not able to                            be intubated. The rectum was not intubated given                            complications the patient had during the last  colonoscopy (friable tissue from radiation) Complications:            No immediate complications. Estimated blood loss:                            Minimal. Estimated Blood Loss:     Estimated blood loss was minimal. Impression:               - The examined portion of the ileum was normal.                           - Diverticulosis in the transverse colon, in the                            proximal transverse colon, in the ascending colon                            and in the cecum.                           - One 3 mm polyp in the transverse colon, removed                            with a cold biopsy forceps. Resected and retrieved.                           - The examination of the proximal colon was                            otherwise normal.                           - Unable to traverse the opening of the distal limb                            from the colostomy. Unfortunately due to the                            patient's complicated anatomy we are not able to                            clear the left colon. Recommendation:           - Patient has a contact number available for                            emergencies. The signs and symptoms of potential                            delayed complications were discussed with the                            patient. Return to normal activities tomorrow.  Written discharge instructions were provided to the                            patient.                            - Resume previous diet.                           - Continue present medications.                           - Await pathology results. Remo Lipps P. Tareq Dwan, MD 03/01/2020 11:16:55 AM This report has been signed electronically.

## 2020-03-01 NOTE — Progress Notes (Signed)
Patient here for surveillance colonoscopy. Last exam 5 years ago. She has a transverse colostomy in place. At the last visit we were both able to traverse the distal limb and examined her diverted colon through the rectum. 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 She has recovered from this, has ventral hernia in right abdomen, colostomy in left abdomen. I have discussed risks / benefits of surveillance exam and she understands. We will only plan on evaluating her colon through the colostomy today, no plans to evaluate the diverted left colon per rectum in light of severe diverticulosis and radiation changes there, history of perforation, especially given her colostomy she cannot prep that part of her colon well. She agrees to proceed, all questions answered.

## 2020-03-01 NOTE — Patient Instructions (Signed)
HANDOUTS PROVIDED ON: Polyps and Diverticulosis  The polyps removed today have been sent for pathology.  The results can take 1-3 weeks to receive.  When your next colonoscopy should occur will be based on the pathology results.    You may resume your previous diet and medication schedule.  Thank you for allowing us to care for you today!!!   YOU HAD AN ENDOSCOPIC PROCEDURE TODAY AT THE [facility]:   Refer to the procedure report that was given to you for any specific questions about what was found during the examination.  If the procedure report does not answer your questions, please call your gastroenterologist to clarify.  If you requested that your care partner not be given the details of your procedure findings, then the procedure report has been included in a sealed envelope for you to review at your convenience later.  YOU SHOULD EXPECT: Some feelings of bloating in the abdomen. Passage of more gas than usual.  Walking can help get rid of the air that was put into your GI tract during the procedure and reduce the bloating. If you had a lower endoscopy (such as a colonoscopy or flexible sigmoidoscopy) you may notice spotting of blood in your stool or on the toilet paper. If you underwent a bowel prep for your procedure, you may not have a normal bowel movement for a few days.  Please Note:  You might notice some irritation and congestion in your nose or some drainage.  This is from the oxygen used during your procedure.  There is no need for concern and it should clear up in a day or so.  SYMPTOMS TO REPORT IMMEDIATELY:  Following lower endoscopy (colonoscopy or flexible sigmoidoscopy):  Excessive amounts of blood in the stool  Significant tenderness or worsening of abdominal pains  Swelling of the abdomen that is new, acute  Fever of 100F or higher  For urgent or emergent issues, a gastroenterologist can be reached at any hour by calling [phone]. Do not use MyChart  messaging for urgent concerns.    DIET:  We do recommend a small meal at first, but then you may proceed to your regular diet.  Drink plenty of fluids but you should avoid alcoholic beverages for 24 hours.  ACTIVITY:  You should plan to take it easy for the rest of today and you should NOT DRIVE or use heavy machinery until tomorrow (because of the sedation medicines used during the test).    FOLLOW UP: Our staff will call the number listed on your records 48-72 hours following your procedure to check on you and address any questions or concerns that you may have regarding the information given to you following your procedure. If we do not reach you, we will leave a message.  We will attempt to reach you two times.  During this call, we will ask if you have developed any symptoms of COVID 19. If you develop any symptoms (ie: fever, flu-like symptoms, shortness of breath, cough etc.) before then, please call [phone].  If you test positive for Covid 19 in the 2 weeks post procedure, please call and report this information to us.    If any biopsies were taken you will be contacted by phone or by letter within the next 1-3 weeks.  Please call us at [phone] if you have not heard about the biopsies in 3 weeks.    SIGNATURES/CONFIDENTIALITY: You and/or your care partner have signed paperwork which will be entered   entered into your electronic medical record.  These signatures attest to the fact that that the information above on your After Visit Summary has been reviewed and is understood.  Full responsibility of the confidentiality of this discharge information lies with you and/or your care-partner. 

## 2020-03-02 ENCOUNTER — Telehealth: Payer: Self-pay | Admitting: Internal Medicine

## 2020-03-02 NOTE — Telephone Encounter (Signed)
Spoke with patient today. 

## 2020-03-02 NOTE — Telephone Encounter (Signed)
Patient has to fill out paperwork for her health insurance and she needs to know about her cholesterol and blood sugar. Patient # [id number]

## 2020-03-02 NOTE — Telephone Encounter (Signed)
That's great, thanks [employer]

## 2020-03-02 NOTE — Telephone Encounter (Signed)
Called patient, vm is full, unable to leave a message.

## 2020-03-02 NOTE — Telephone Encounter (Signed)
Spoke with patient, pt states that she slept well last night, pt states that she has not had any more cramping or discharge. Patient wanted to let Dr. Havery Griffin know that she is fine.

## 2020-03-02 NOTE — Telephone Encounter (Signed)
[name] thank you for taking this call and speaking with her.   [name] can you please contact the patient and see how she is feeling this AM? I suspect she had some retained gas in her left colon from the procedure. I hope and anticipate she will do better with time,  if she is not feeling better can you please let me know. Thanks

## 2020-03-05 ENCOUNTER — Telehealth: Payer: Self-pay | Admitting: *Deleted

## 2020-03-05 NOTE — Telephone Encounter (Signed)
  Follow up Call-  Call back number 03/01/2020  Post procedure Call Back phone  # [phone]  Permission to leave phone message Yes  Some recent data might be hidden     Patient questions:  Do you have a fever, pain , or abdominal swelling? No. Pain Score  0 *  Have you tolerated food without any problems? Yes.    Have you been able to return to your normal activities? Yes.    Do you have any questions about your discharge instructions: Diet   No. Medications  No. Follow up visit  No.  Do you have questions or concerns about your Care? No.  Actions: * If pain score is 4 or above: No action needed, pain <4.  1. Have you developed a fever since your procedure? no  2.   Have you had an respiratory symptoms (SOB or cough) since your procedure? no  3.   Have you tested positive for COVID 19 since your procedure no  4.   Have you had any family members/close contacts diagnosed with the COVID 19 since your procedure?  no   If yes to any of these questions please route to Joylene John, RN and Joella Prince, RN

## 2020-03-06 ENCOUNTER — Encounter: Payer: Self-pay | Admitting: Gastroenterology

## 2020-03-16 ENCOUNTER — Telehealth: Payer: Self-pay | Admitting: Internal Medicine

## 2020-03-16 NOTE — Telephone Encounter (Signed)
   [facility] calling stating they need a order for her colostomy faxed to them, and they also need a DX ICD code on the prescription. Phone # [phone]

## 2020-03-16 NOTE — Telephone Encounter (Signed)
Called [facility] today and spoke to [place].   Info received.

## 2020-03-26 ENCOUNTER — Telehealth: Payer: Self-pay | Admitting: Internal Medicine

## 2020-03-26 ENCOUNTER — Other Ambulatory Visit: Payer: Self-pay

## 2020-03-26 MED ORDER — ROSUVASTATIN CALCIUM 5 MG PO TABS
5.0000 mg | ORAL_TABLET | ORAL | 3 refills | Status: DC
Start: 2020-03-26 — End: 2020-06-13

## 2020-03-26 MED ORDER — COLCHICINE 0.6 MG PO TABS
ORAL_TABLET | ORAL | 1 refills | Status: DC
Start: 2020-03-26 — End: 2020-05-21

## 2020-03-26 NOTE — Telephone Encounter (Signed)
Faxed in today for patient 

## 2020-03-26 NOTE — Telephone Encounter (Signed)
1.Medication Requested: rosuvastatin (CRESTOR) 5 MG tablet colchicine 0.6 MG tablet    2. Pharmacy (Name, Street, [place]):[facility], [name], [address]  3. On Med List: yes   4. Last Visit with PCP: 11.3.2021   5. Next visit date with PCP: n/a    Agent: Please be advised that RX refills may take up to 3 business days. We ask that you follow-up with your pharmacy.    Patient also said she is experiencing some severe cramping in bother her legs and right hand. She said it makes it hard for her to get up in the mornings.

## 2020-05-21 ENCOUNTER — Other Ambulatory Visit: Payer: Self-pay

## 2020-05-21 ENCOUNTER — Telehealth: Payer: Self-pay | Admitting: Internal Medicine

## 2020-05-21 DIAGNOSIS — F419 Anxiety disorder, unspecified: Secondary | ICD-10-CM

## 2020-05-21 MED ORDER — ALPRAZOLAM 0.5 MG PO TABS: 0.5000 mg | ORAL_TABLET | Freq: Two times a day (BID) | ORAL | 0 refills | Status: DC | PRN

## 2020-05-21 MED ORDER — COLCHICINE 0.6 MG PO TABS
ORAL_TABLET | ORAL | 1 refills | Status: DC
Start: 2020-05-21 — End: 2020-10-09

## 2020-05-21 NOTE — Telephone Encounter (Signed)
1.Medication Requested: colchicine 0.6 MG tablet ALPRAZolam (XANAX) 0.5 MG tablet 2. Pharmacy (Name, Street, [place]): [facility], [address], [address] Phone:  [phone]  Fax:  [phone]      3. On Med List: yes 4. Last Visit with PCP: 11.03.21 5. Next visit date with PCP: 03.02.22

## 2020-05-24 DIAGNOSIS — Z933 Colostomy status: Secondary | ICD-10-CM | POA: Diagnosis not present

## 2020-05-26 ENCOUNTER — Other Ambulatory Visit: Payer: Self-pay | Admitting: Internal Medicine

## 2020-06-12 NOTE — Progress Notes (Signed)
Subjective:    Patient ID: [name], female    DOB: Mar 25, 1956, 65 y.o.   MRN: [id number]  HPI The patient is here for follow up of their chronic medical problems, including DM, htn, hyperlipidemia, peripheral neuropathy, anxiety B12 def, smoking, alcohol abuse  She has taken the colchicine twice since she was here last.  She is drinking less - 2-3 beers a week - she has cut down.  She is still smoking - she is cutting down.  She has gone a day here and there w/o smoking.     She is taking the gabapentin more - increased aching and tingling in feet up to knee.    Medications and allergies reviewed with patient and updated if appropriate.  Patient Active Problem List   Diagnosis Date Noted  . Hyperuricemia 02/16/2020  . Acute gout of right ankle 02/15/2020  . Alcohol abuse   . Hypertriglyceridemia 06/13/2019  . Aortic atherosclerosis ([place]) 06/13/2019  . 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place [facility]) 12/13/2018  . B12 deficiency 06/15/2016  . Anxiety 06/12/2016  . Type 2 diabetes mellitus with diabetic autonomic neuropathy, without long-term current use of insulin ([place]) 01/10/2016  . Peristomal hernia 04/19/2012  . Seborrheic dermatitis 08/09/2010  . PERSONAL HX RECTAL CANCER 06/05/2010  . Tobacco dependence due to cigarettes 04/16/2010  . ALLERGIC RHINITIS 04/16/2010  . Peripheral neuropathy ([place]) 12/06/2009  . Essential hypertension 12/06/2009  . Diarrhea 12/06/2009    Current Outpatient Medications on File Prior to Visit  Medication Sig Dispense Refill  . ALPRAZolam (XANAX) 0.5 MG tablet Take 1 tablet (0.5 mg total) by mouth 2 (two) times daily as needed for anxiety. 60 tablet 0  . blood glucose meter kit and supplies KIT Dispense based on patient and insurance preference. Use up to four times daily as directed. (FOR E11.9). 1 each 0  . colchicine 0.6 MG tablet Take 2 tabs x 1 and then 1 tab one hour later for gout flare 21 tablet 1  . fenofibrate (TRICOR) 145 MG tablet TAKE 1  TABLET BY MOUTH ONCE DAILY 30 tablet 11  . hydrochlorothiazide (HYDRODIURIL) 25 MG tablet TAKE 1 TABLET BY MOUTH  DAILY 90 tablet 3  . lisinopril (ZESTRIL) 10 MG tablet TAKE 1 TABLET BY MOUTH  DAILY 90 tablet 3  . metFORMIN (GLUCOPHAGE) 500 MG tablet Take 1 tablet (500 mg total) by mouth 2 (two) times daily with a meal. 180 tablet 3  . rosuvastatin (CRESTOR) 5 MG tablet Take 1 tablet (5 mg total) by mouth 2 (two) times a week. 13 tablet 3  . Sodium Sulfate-Mag Sulfate-KCl (SUTAB) [phone] MG TABS Take 1 kit by mouth as directed. MANUFACTURER CODES!! BIN: [id number] PCN: CN GROUP: [id number] MEMBER ID: [id number];PFX AS CASH;NO PRIOR AUTHORIZATION 24 tablet 0   No current facility-administered medications on file prior to visit.    Past Medical History:  Diagnosis Date  . Adenomatous colon polyp   . Anemia, unspecified   . Cocaine abuse ([place])    as recently as 10/21/11  . Depression   . Difficulty sleeping    takes meds to sleep  . Diverticulosis   . Hernia 03/18/2013  . History of transfusion   . Hypertension   . Incontinence of bowel    since colostomy  . Knee fracture, left   . 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 Nonspecific colitis 10/10/11  . Radiation proctitis    mild  . 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 Tibial plateau fracture, left 01/19/2014    Past Surgical History:  Procedure Laterality Date  . APPENDECTOMY N/A 01/09/2015   Procedure: APPENDECTOMY;  Surgeon: Excell Seltzer, MD;  Location: [facility];  Service: General;  Laterality: N/A;  . COLONOSCOPY  01/09/2015   [facility]  . COLOSTOMY  2013   [facility]  . FINGER FRACTURE SURGERY Left    and hand surgery  . HERNIA REPAIR    . LAPAROSCOPIC PARASTOMAL HERNIA N/A 05/12/2013   Procedure: LAPAROSCOPIC PARASTOMAL HERNIA;  Surgeon: Leighton Ruff, MD;  Location: [facility];  Service: General;  Laterality: N/A;  . LAPAROTOMY N/A 01/09/2015   Procedure: EXPLORATORY LAPAROTOMY;   Surgeon: Excell Seltzer, MD;  Location: [facility];  Service: General;  Laterality: N/A;  RESECTION OF COLOSTOMY, CREATION OF NEW COLOSTOMY, VENTRAL HERNIA REPAIR WITH BIOLOGIC MESH,  LYSIS OF ADHESIONS , OMENTECTOMY  . LOW ANTERIOR BOWEL RESECTION  03/07/09  . MOUTH SURGERY  10/2009  . PORTACATH PLACEMENT     and removal    Social History   Socioeconomic History  . Marital status: Significant Other    Spouse name: Not on file  . Number of children: 0  . Years of education: Not on file  . Highest education level: Not on file  Occupational History  . Occupation: disabiled  Tobacco Use  . Smoking status: Current Every Day Smoker    Packs/day: 0.25    Years: 40.00    Pack years: 10.00    Types: Cigarettes  . Smokeless tobacco: Never Used  . Tobacco comment: Pt. in project free program, motivated to stop smoking completely. Declined smoking cessation cone program.  Vaping Use  . Vaping Use: Never used  Substance and Sexual Activity  . Alcohol use: Yes    Alcohol/week: 18.0 standard drinks    Types: 14 Cans of beer, 4 Glasses of wine per week  . Drug use: Yes    Frequency: 1.0 times per week    Types: Marijuana, Cocaine    Comment: Pt is using Marijuana; last use of cocaine nov 2014   . Sexual activity: Not on file  Other Topics Concern  . 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 Difficulty of Paying Living Expenses: Not hard at all  Food Insecurity: No Food Insecurity  . Worried About Charity fundraiser in the Last Year: Never true  . Ran Out of Food  in the Last Year: Never true  Transportation Needs: No Transportation Needs  . Lack of Transportation (Medical): No  . Lack of Transportation (Non-Medical): No  Physical Activity: Sufficiently Active  . Days of Exercise per Week: 5 days  . Minutes of Exercise per Session: 30 min  Stress: Stress Concern Present  . Feeling of Stress : Rather much  Social Connections: Unknown  . Frequency of Communication with Friends and Family: More than three times a week  . Frequency of Social Gatherings with Friends and Family: More than three times a week  . Attends Religious Services: Patient refused  . Active  Member of Clubs or Organizations: Patient refused  . Attends Archivist Meetings: Patient refused  . Marital Status: Living with partner    Family History  Problem Relation Age of Onset  . Colon cancer Mother 66  . Diabetes Father   . Cirrhosis Brother   . Stomach cancer Sister   . Colon polyps Sister   . Pulmonary Hypertension Brother   . Esophageal cancer Neg Hx   . Rectal cancer Neg Hx     Review of Systems  Constitutional: Negative for chills and fever.  HENT: Negative for congestion and sore throat.   Respiratory: Positive for cough (? related to cold) and wheezing (slight). Negative for shortness of breath.   Cardiovascular: Negative for chest pain, palpitations and leg swelling.  Neurological: Negative for light-headedness and headaches.       Objective:   Vitals:   06/13/20 0743  BP: 130/78  Pulse: 60  Temp: 98.1 F (36.7 C)  SpO2: 92%   BP Readings from Last 3 Encounters:  06/13/20 130/78  03/01/20 (!) 116/57  02/15/20 130/72   Wt Readings from Last 3 Encounters:  06/13/20 175 lb (79.4 kg)  03/01/20 177 lb (80.3 kg)  02/15/20 176 lb (79.8 kg)   Body mass index is 28.25 kg/m.   Physical Exam    Constitutional: Appears well-developed and well-nourished. No distress.  HENT:  Head: Normocephalic and atraumatic.  Neck: Neck supple. No tracheal  deviation present. No thyromegaly present.  No cervical lymphadenopathy Cardiovascular: Normal rate, regular rhythm and normal heart sounds.   No murmur heard. No carotid bruit .  No edema Pulmonary/Chest: Effort normal and breath sounds normal. No respiratory distress. No has no wheezes. No rales.  Skin: Skin is warm and dry. Not diaphoretic.  Psychiatric: Normal mood and affect. Behavior is normal.      Assessment & Plan:    See Problem List for Assessment and Plan of chronic medical problems.    This visit occurred during the SARS-CoV-2 public health emergency.  Safety protocols were in place, including screening questions prior to the visit, additional usage of staff PPE, and extensive cleaning of exam room while observing appropriate contact time as indicated for disinfecting solutions.

## 2020-06-12 NOTE — Patient Instructions (Addendum)
  Blood work was ordered.     Medications changes include :   Increase gabapentin to 1200 mg at night   Your prescription(s) have been submitted to your pharmacy. Please take as directed and contact our office if you believe you are having problem(s) with the medication(s).    Please followup in 6 months

## 2020-06-13 ENCOUNTER — Ambulatory Visit (INDEPENDENT_AMBULATORY_CARE_PROVIDER_SITE_OTHER): Payer: Medicare Other | Admitting: Internal Medicine

## 2020-06-13 ENCOUNTER — Other Ambulatory Visit: Payer: Self-pay

## 2020-06-13 ENCOUNTER — Encounter: Payer: Self-pay | Admitting: Internal Medicine

## 2020-06-13 VITALS — BP 130/78 | HR 60 | Temp 98.1°F | Ht 66.0 in | Wt 175.0 lb

## 2020-06-13 DIAGNOSIS — F419 Anxiety disorder, unspecified: Secondary | ICD-10-CM

## 2020-06-13 DIAGNOSIS — E1143 Type 2 diabetes mellitus with diabetic autonomic (poly)neuropathy: Secondary | ICD-10-CM

## 2020-06-13 DIAGNOSIS — F17210 Nicotine dependence, cigarettes, uncomplicated: Secondary | ICD-10-CM | POA: Diagnosis not present

## 2020-06-13 DIAGNOSIS — G6289 Other specified polyneuropathies: Secondary | ICD-10-CM | POA: Diagnosis not present

## 2020-06-13 DIAGNOSIS — E781 Pure hyperglyceridemia: Secondary | ICD-10-CM | POA: Diagnosis not present

## 2020-06-13 DIAGNOSIS — E790 Hyperuricemia without signs of inflammatory arthritis and tophaceous disease: Secondary | ICD-10-CM

## 2020-06-13 DIAGNOSIS — F1010 Alcohol abuse, uncomplicated: Secondary | ICD-10-CM

## 2020-06-13 DIAGNOSIS — E538 Deficiency of other specified B group vitamins: Secondary | ICD-10-CM

## 2020-06-13 DIAGNOSIS — Z8739 Personal history of other diseases of the musculoskeletal system and connective tissue: Secondary | ICD-10-CM | POA: Diagnosis not present

## 2020-06-13 DIAGNOSIS — I10 Essential (primary) hypertension: Secondary | ICD-10-CM | POA: Diagnosis not present

## 2020-06-13 LAB — LIPID PANEL
Cholesterol: 148 mg/dL (ref 0–200)
HDL: 39 mg/dL — ABNORMAL LOW (ref >39.00)
LDL Cholesterol: 80 mg/dL (ref 0–99)
NonHDL: 109.06
Total CHOL/HDL Ratio: 4
Triglycerides: 145 mg/dL (ref 0.0–149.0)
VLDL: 29 mg/dL (ref 0.0–40.0)

## 2020-06-13 LAB — CBC WITH DIFFERENTIAL/PLATELET
Basophils Absolute: 0.1 10*3/uL (ref 0.0–0.1)
Basophils Relative: 0.7 % (ref 0.0–3.0)
Eosinophils Absolute: 0.1 10*3/uL (ref 0.0–0.7)
Eosinophils Relative: 1.5 % (ref 0.0–5.0)
HCT: 41 % (ref 36.0–46.0)
Hemoglobin: 13.5 g/dL (ref 12.0–15.0)
Lymphocytes Relative: 19 % (ref 12.0–46.0)
Lymphs Abs: 1.6 10*3/uL (ref 0.7–4.0)
MCHC: 33 g/dL (ref 30.0–36.0)
MCV: 90.4 fl (ref 78.0–100.0)
Monocytes Absolute: 0.6 10*3/uL (ref 0.1–1.0)
Monocytes Relative: 7.1 % (ref 3.0–12.0)
Neutro Abs: 5.9 10*3/uL (ref 1.4–7.7)
Neutrophils Relative %: 71.7 % (ref 43.0–77.0)
Platelets: 341 10*3/uL (ref 150.0–400.0)
RBC: 4.53 Mil/uL (ref 3.87–5.11)
RDW: 14.8 % (ref 11.5–15.5)
WBC: 8.2 10*3/uL (ref 4.0–10.5)

## 2020-06-13 LAB — COMPREHENSIVE METABOLIC PANEL
ALT: 11 U/L (ref 0–35)
AST: 17 U/L (ref 0–37)
Albumin: 4.2 g/dL (ref 3.5–5.2)
Alkaline Phosphatase: 62 U/L (ref 39–117)
BUN: 28 mg/dL — ABNORMAL HIGH (ref 6–23)
CO2: 25 mEq/L (ref 19–32)
Calcium: 10.2 mg/dL (ref 8.4–10.5)
Chloride: 106 mEq/L (ref 96–112)
Creatinine, Ser: 1.18 mg/dL (ref 0.40–1.20)
GFR: 48.98 mL/min — ABNORMAL LOW (ref >60.00)
Glucose, Bld: 122 mg/dL — ABNORMAL HIGH (ref 70–99)
Potassium: 4.4 mEq/L (ref 3.5–5.1)
Sodium: 139 mEq/L (ref 135–145)
Total Bilirubin: 0.4 mg/dL (ref 0.2–1.2)
Total Protein: 7.1 g/dL (ref 6.0–8.3)

## 2020-06-13 LAB — URIC ACID: Uric Acid, Serum: 7.2 mg/dL — ABNORMAL HIGH (ref 2.4–7.0)

## 2020-06-13 LAB — HEMOGLOBIN A1C: Hgb A1c MFr Bld: 6.8 % — ABNORMAL HIGH (ref 4.6–6.5)

## 2020-06-13 MED ORDER — ALPRAZOLAM 0.5 MG PO TABS: 0.5000 mg | ORAL_TABLET | Freq: Two times a day (BID) | ORAL | 0 refills | Status: DC | PRN

## 2020-06-13 MED ORDER — GABAPENTIN 600 MG PO TABS: ORAL_TABLET | ORAL | 11 refills | Status: DC

## 2020-06-13 MED ORDER — ROSUVASTATIN CALCIUM 5 MG PO TABS: 5.0000 mg | ORAL_TABLET | ORAL | 3 refills | Status: DC

## 2020-06-13 NOTE — Assessment & Plan Note (Addendum)
Chronic Has worsened, has gotten worse Will try increasing gabapentin to a higher dose at night-she will try 1200 mg at night-if this is too much she will let me know and we will decrease this slightly, continue 600 mg in the morning and in the afternoon Stressed the importance of checking her feet on a daily basis and she does do that

## 2020-06-13 NOTE — Assessment & Plan Note (Signed)
Chronic ?Check B12 level ?

## 2020-06-13 NOTE — Assessment & Plan Note (Signed)
Chronic Has been well controlled Continue Metformin 500 mg twice daily A1c today

## 2020-06-13 NOTE — Assessment & Plan Note (Signed)
Chronic She has cut down and now is only drinking 2-3 beers a week Stressed the importance of quitting completely-discussed that this will make her neuropathy worse in addition to her other medical problems Deferred any assistance

## 2020-06-13 NOTE — Assessment & Plan Note (Addendum)
Chronic BP well controlled Continue hydrochlorothiazide 25 mg daily, lisinopril 10 mg daily cmp

## 2020-06-13 NOTE — Assessment & Plan Note (Signed)
Chronic Controlled, stable Continue alprazolam 0.5 mg twice daily as needed

## 2020-06-13 NOTE — Assessment & Plan Note (Signed)
History of gout of right ankle 02/2020 and has had 2 episodes since then Check uric acid level-advised that if it is elevated we start allopurinol on a daily basis for prevention Continue colchicine 2 tabs x1 at the onset of a gout attack and then 1 tab 1 hour later if needed

## 2020-06-13 NOTE — Assessment & Plan Note (Signed)
Chronic Check lipid panel Continue rosuvastatin 5 mg twice weekly and fenofibrate 145 mg daily Stressed alcohol cessation

## 2020-06-13 NOTE — Assessment & Plan Note (Signed)
Chronic She has decreased her smoking and can go a day without smoking She is consuming about a pack in a week or a little over a week Deferred palliative care or medication to help

## 2020-06-14 LAB — VITAMIN B12: Vitamin B-12: 300 pg/mL (ref 211–911)

## 2020-07-11 ENCOUNTER — Other Ambulatory Visit: Payer: Self-pay | Admitting: Internal Medicine

## 2020-07-11 DIAGNOSIS — F419 Anxiety disorder, unspecified: Secondary | ICD-10-CM

## 2020-07-12 ENCOUNTER — Other Ambulatory Visit: Payer: Self-pay | Admitting: Internal Medicine

## 2020-07-26 ENCOUNTER — Telehealth: Payer: Self-pay | Admitting: Internal Medicine

## 2020-07-26 ENCOUNTER — Other Ambulatory Visit: Payer: Self-pay

## 2020-07-26 MED ORDER — FREESTYLE LITE TEST VI STRP: ORAL_STRIP | 12 refills | Status: DC

## 2020-07-26 NOTE — Telephone Encounter (Signed)
Patient called and said that she is needing a refill for Free Style Lite test strips. It can be sent to [facility], [address], [address]. Please advise

## 2020-07-26 NOTE — Telephone Encounter (Signed)
Sent script in today

## 2020-08-07 ENCOUNTER — Telehealth: Payer: Self-pay | Admitting: Internal Medicine

## 2020-08-07 DIAGNOSIS — E1143 Type 2 diabetes mellitus with diabetic autonomic (poly)neuropathy: Secondary | ICD-10-CM

## 2020-08-07 MED ORDER — ONETOUCH ULTRASOFT LANCETS MISC: 12 refills | Status: AC

## 2020-08-07 MED ORDER — ONETOUCH ULTRA 2 W/DEVICE KIT: PACK | 0 refills | Status: DC

## 2020-08-07 NOTE — Telephone Encounter (Signed)
Spoke with patient. She has strips already. Glucometer and lancets sent in.

## 2020-08-07 NOTE — Telephone Encounter (Signed)
I ordered a One Touch glucometer.  I ordered the lancets.  Both have been printed.  Not sure if she needs the strips or not - ?

## 2020-08-07 NOTE — Telephone Encounter (Signed)
Patient is requesting order for Accu Chek / One Touch glucometer and lancets be sent to [facility]

## 2020-08-17 ENCOUNTER — Telehealth: Payer: Self-pay | Admitting: Internal Medicine

## 2020-08-17 DIAGNOSIS — F419 Anxiety disorder, unspecified: Secondary | ICD-10-CM

## 2020-08-17 MED ORDER — ALPRAZOLAM 0.5 MG PO TABS: 0.5000 mg | ORAL_TABLET | Freq: Two times a day (BID) | ORAL | 0 refills | Status: DC | PRN

## 2020-08-17 NOTE — Telephone Encounter (Signed)
Spoke with patient today. 

## 2020-08-17 NOTE — Telephone Encounter (Signed)
ALPRAZolam Duanne Moron) 0.5 MG tablet [place], [address], [address] Phone:  [phone]  Fax:  [phone]     Requesting a refill  Also wondering if its okay for her to get an OTC memory loss medicine. Unable to tell me the name

## 2020-08-17 NOTE — Telephone Encounter (Signed)
Yes, okay to take over-the-counter memory medication

## 2020-09-03 ENCOUNTER — Telehealth: Payer: Self-pay | Admitting: Internal Medicine

## 2020-09-03 NOTE — Progress Notes (Signed)
  Chronic Care Management   Note  09/03/2020 Name: Sima Lindenberger MRN: [id number] DOB: 03/09/1956  Liora Myles is a 65 y.o. year old female who is a primary care patient of Burns, Claudina Lick, MD. I reached out to [facility] by phone today in response to a referral sent by Ms. Easton [facility]'s PCP, Quay Burow, Claudina Lick, MD.   Ms. Clos was given information about Chronic Care Management services today including:  1. CCM service includes personalized support from designated clinical staff supervised by her physician, including individualized plan of care and coordination with other care providers 2. 24/7 contact phone numbers for assistance for urgent and routine care needs. 3. Service will only be billed when office clinical staff spend 20 minutes or more in a month to coordinate care. 4. Only one practitioner may furnish and bill the service in a calendar month. 5. The patient may stop CCM services at any time (effective at the end of the month) by phone call to the office staff.   Patient agreed to services and verbal consent obtained.   Follow up plan:   [place]

## 2020-09-14 DIAGNOSIS — Z933 Colostomy status: Secondary | ICD-10-CM | POA: Diagnosis not present

## 2020-09-19 NOTE — Progress Notes (Signed)
Chronic Care Management Pharmacy Note  09/20/2020 Name:  [name] MRN:  [id number] DOB:  1956/02/03  Subjective: [name] is an 65 y.o. year old female who is a primary patient of Burns, Claudina Lick, MD.  The CCM team was consulted for assistance with disease management and care coordination needs.    Engaged with patient by telephone for initial visit in response to provider referral for pharmacy case management and/or care coordination services.   Consent to Services:  The patient was given the following information about Chronic Care Management services today, agreed to services, and gave verbal consent: 1. CCM service includes personalized support from designated clinical staff supervised by the primary care provider, including individualized plan of care and coordination with other care providers 2. 24/7 contact phone numbers for assistance for urgent and routine care needs. 3. Service will only be billed when office clinical staff spend 20 minutes or more in a month to coordinate care. 4. Only one practitioner may furnish and bill the service in a calendar month. 5.The patient may stop CCM services at any time (effective at the end of the month) by phone call to the office staff. 6. The patient will be responsible for cost sharing (co-pay) of up to 20% of the service fee (after annual deductible is met). Patient agreed to services and consent obtained.  Patient Care Team: Binnie Rail, MD as PCP - General (Internal Medicine) Yisroel Ramming, Everardo All, MD (Obstetrics and Gynecology) Jana Half, DPM (Podiatry) Heath Lark, MD as Consulting Physician (Hematology and Oncology) Leighton Ruff, MD (General Surgery) Armbruster, Carlota Raspberry, MD as Consulting Physician (Gastroenterology) Tomasa Blase, [facility] as Pharmacist (Pharmacist)  Recent office visits: 06/13/2020 - PCP visit - HTN and peripheral neuropathy - gabapentin increased to 1220m at night  02/15/2020 -  PCP visit - acute gout attack of right ankle - prescribe colchicine   Recent consult visits: 03/01/2020 - surveillance colonoscopy - completed via colostomy   Hospital visits: None in previous 6 months  Objective:  Lab Results  Component Value Date   CREATININE 1.18 06/13/2020   BUN 28 (H) 06/13/2020   GFR 48.98 (L) 06/13/2020   GFRNONAA >60 01/20/2015   GFRAA >60 01/20/2015   NA 139 06/13/2020   K 4.4 06/13/2020   CALCIUM 10.2 06/13/2020   CO2 25 06/13/2020   GLUCOSE 122 (H) 06/13/2020    Lab Results  Component Value Date/Time   HGBA1C 6.8 (H) 06/13/2020 08:16 AM   HGBA1C 6.5 (A) 11/21/2019 10:07 AM   HGBA1C 7.8 (H) 06/13/2019 08:22 AM   GFR 48.98 (L) 06/13/2020 08:16 AM   GFR 73.68 06/13/2019 08:22 AM   MICROALBUR 11.0 (H) 12/01/2016 08:47 AM    Last diabetic Eye exam:  No results found for: HMDIABEYEEXA  Last diabetic Foot exam:  No results found for: HMDIABFOOTEX   Lab Results  Component Value Date   CHOL 148 06/13/2020   HDL 39.00 (L) 06/13/2020   LDLCALC 80 06/13/2020   LDLDIRECT 51.0 12/13/2018   TRIG 145.0 06/13/2020   CHOLHDL 4 06/13/2020    Hepatic Function Latest Ref Rng & Units 06/13/2020 06/13/2019 12/13/2018  Total Protein 6.0 - 8.3 g/dL 7.1 7.3 7.0  Albumin 3.5 - 5.2 g/dL 4.2 4.2 4.1  AST 0 - 37 U/L _0 ALT 0 - 35 U/L _1 Alk Phosphatase 39 - 117 U/L 62 71 97  Total Bilirubin 0.2 - 1.2 mg/dL 0.4 0.5 0.4  Bilirubin,  Direct 0.0 - 0.3 mg/dL - - -    Lab Results  Component Value Date/Time   TSH 0.82 10/09/2015 04:45 PM   TSH 0.87 05/23/2014 09:27 AM    CBC Latest Ref Rng & Units 06/13/2020 06/13/2019 06/12/2016  WBC 4.0 - 10.5 K/uL 8.2 8.4 8.0  Hemoglobin 12.0 - 15.0 g/dL 13.5 12.7 13.9  Hematocrit 36.0 - 46.0 % 41.0 38.7 41.6  Platelets 150.0 - 400.0 K/uL 341.0 322.0 298.0    No results found for: VD25OH  Clinical ASCVD: No  The 10-year ASCVD risk score Mikey Bussing DC Jr., et al., 2013) is: 31.4%   Values used to calculate the score:      Age: 33 years     Sex: Female     Is Non-Hispanic African American: Yes     Diabetic: Yes     Tobacco smoker: Yes     Systolic Blood Pressure: 660 mmHg     Is BP treated: Yes     HDL Cholesterol: 39 mg/dL     Total Cholesterol: 148 mg/dL    Depression screen [facility] 2/9 01/02/2020 11/21/2019 04/20/2018  Decreased Interest 0 0 0  Down, Depressed, Hopeless 1 0 0  PHQ - 2 Score 1 0 0  Altered sleeping - - 2  Tired, decreased energy - - 0  Change in appetite - - 0  Feeling bad or failure about yourself  - - 0  Trouble concentrating - - 0  Moving slowly or fidgety/restless - - 0  Suicidal thoughts - - 0  PHQ-9 Score - - 2  Difficult doing work/chores - - Not difficult at all  Some recent data might be hidden     Social History   Tobacco Use  Smoking Status Every Day   Packs/day: 0.25   Years: 40.00   Pack years: 10.00   Types: Cigarettes  Smokeless Tobacco Never  Tobacco Comments   Pt. in project free program, motivated to stop smoking completely. Declined smoking cessation cone program.   BP Readings from Last 3 Encounters:  06/13/20 130/78  03/01/20 (!) 116/57  02/15/20 130/72   Pulse Readings from Last 3 Encounters:  06/13/20 60  03/01/20 68  02/15/20 92   Wt Readings from Last 3 Encounters:  06/13/20 175 lb (79.4 kg)  03/01/20 177 lb (80.3 kg)  02/15/20 176 lb (79.8 kg)   BMI Readings from Last 3 Encounters:  06/13/20 28.25 kg/m  03/01/20 28.57 kg/m  02/15/20 28.41 kg/m    Assessment/Interventions: Review of patient past medical history, allergies, medications, health status, including review of consultants reports, laboratory and other test data, was performed as part of comprehensive evaluation and provision of chronic care management services.   SDOH:  (Social Determinants of Health) assessments and interventions performed: Yes  SDOH Screenings   Alcohol Screen: Medium Risk   Last Alcohol Screening Score (AUDIT): 10  Depression (PHQ2-9): Low Risk     PHQ-2 Score: 1  Financial Resource Strain: Low Risk    Difficulty of Paying Living Expenses: Not hard at all  Food Insecurity: No Food Insecurity   Worried About Charity fundraiser in the Last Year: Never true   Ran Out of Food in the Last Year: Never true  Housing: Low Risk    Last Housing Risk Score: 0  Physical Activity: Sufficiently Active   Days of Exercise per Week: 5 days   Minutes of Exercise per Session: 30 min  Social Connections: Unknown   Frequency of Communication with Friends  and Family: More than three times a week   Frequency of Social Gatherings with Friends and Family: More than three times a week   Attends Religious Services: Patient refused   Marine scientist or Organizations: Patient refused   Attends Music therapist: Patient refused   Marital Status: Living with partner  Stress: Stress Concern Present   Feeling of Stress : Rather much  Tobacco Use: High Risk   Smoking Tobacco Use: Every Day   Smokeless Tobacco Use: Never  Transportation Needs: No Transportation Needs   Lack of Transportation (Medical): No   Lack of Transportation (Non-Medical): No    CCM Care Plan  Allergies  Allergen Reactions   Ampicillin Hives   Hydrocodone Itching    Can take Oxycodone and Codeine   Penicillins Other (See Comments)    Unknown reaction   Grapeseed Extract [Nutritional Supplements] Rash    Medications Reviewed Today     Reviewed by Tomasa Blase, [facility] (Pharmacist) on 09/20/20 at [place] List Status: <None>   Medication Order Taking? Sig Documenting Provider Last Dose Status Informant  ALPRAZolam (XANAX) 0.5 MG tablet [id number] Yes Take 1 tablet (0.5 mg total) by mouth 2 (two) times daily as needed. for anxiety Binnie Rail, MD Taking Active   blood glucose meter kit and supplies KIT [id number] Yes Dispense based on patient and insurance preference. Use up to four times daily as directed. (FOR E11.9). Binnie Rail, MD Taking Active    Blood Glucose Monitoring Suppl (ONE TOUCH ULTRA 2) w/Device KIT [id number] Yes Use as directed to check sugars once daily and as needed.  Dx N27.78 Binnie Rail, MD Taking Active   colchicine 0.6 MG tablet [id number] Yes Take 2 tabs x 1 and then 1 tab one hour later for gout flare Burns, Claudina Lick, MD Taking Active   fenofibrate (TRICOR) 145 MG tablet [id number] Yes TAKE 1 TABLET BY MOUTH ONCE DAILY Burns, Claudina Lick, MD Taking Active   gabapentin (NEURONTIN) 600 MG tablet [id number] Yes 1 tab in morning and afternoon, take 2 tabs at night  Patient taking differently: 1 tab in morning and take 2 tabs at night   Binnie Rail, MD Taking Active   glucose blood (FREESTYLE LITE) test strip [id number] Yes Use as instructed Binnie Rail, MD Taking Active   hydrochlorothiazide (HYDRODIURIL) 25 MG tablet [id number] Yes TAKE 1 TABLET BY MOUTH  DAILY Burns, Claudina Lick, MD Taking Active   Lancets [facility] ULTRASOFT) lancets [id number] Yes Use as directed to check sugars once daily and as needed.  Dx F79.02 Binnie Rail, MD Taking Active   lisinopril (ZESTRIL) 10 MG tablet [id number] Yes TAKE 1 TABLET BY MOUTH  DAILY Burns, Claudina Lick, MD Taking Active   metFORMIN (GLUCOPHAGE) 500 MG tablet [id number] Yes TAKE 1 TABLET BY MOUTH  TWICE DAILY WITH MEALS Burns, Claudina Lick, MD Taking Active   rosuvastatin (CRESTOR) 5 MG tablet [id number] Yes Take 1 tablet (5 mg total) by mouth 2 (two) times a week. Binnie Rail, MD Taking Active             Patient Active Problem List   Diagnosis Date Noted   History of gout 02/15/2020   Alcohol abuse    Hypertriglyceridemia 06/13/2019   Aortic atherosclerosis ([place]) 06/13/2019   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place [facility]) 12/13/2018   B12 deficiency 06/15/2016   Anxiety 06/12/2016   Type 2 diabetes mellitus with diabetic autonomic neuropathy, without long-term current use of  insulin ([place]) 01/10/2016   Peristomal hernia 04/19/2012   Seborrheic dermatitis 08/09/2010   PERSONAL HX RECTAL CANCER  06/05/2010   Tobacco dependence due to cigarettes 04/16/2010   ALLERGIC RHINITIS 04/16/2010   Peripheral neuropathy ([place]) 12/06/2009   Essential hypertension 12/06/2009   Diarrhea 12/06/2009    Immunization History  Administered Date(s) Administered   Influenza Whole 01/02/2020   Influenza,inj,Quad PF,6+ Mos 03/18/2013, 05/23/2014, 01/10/2016, 04/03/2017, 12/09/2017, 12/13/2018   PFIZER(Purple Top)SARS-COV-2 Vaccination 07/07/2019, 07/28/2019   Pneumococcal Conjugate-13 05/23/2014   Pneumococcal Polysaccharide-23 06/12/2016   Td 05/01/2010    Conditions to be addressed/monitored:  Hypertension, Hyperlipidemia, Diabetes, Anxiety, Gout and peripheral neuropathy   Care Plan : CCM Care Plan  Updates made by Tomasa Blase, [place] since 09/20/2020 12:00 AM     Problem: HTN, HLD, DM2, Gout, Anxiety, Neuropathy, Smoking Cessation   Priority: High     Long-Range Goal: Disease Management   Start Date: 09/20/2020  Expected End Date: 03/22/2021  This Visit's Progress: On track  Priority: High  Note:   Current Barriers:  Unable to independently monitor therapeutic efficacy  Pharmacist Clinical Goal(s):  Patient will achieve adherence to monitoring guidelines and medication adherence to achieve therapeutic efficacy maintain control of BP, Blood sugars, and LDL as evidenced by BP and blood sugar logs / next lipid panel  Smoking cessation through collaboration with PharmD and provider.   Interventions: 1:1 collaboration with Binnie Rail, MD regarding development and update of comprehensive plan of care as evidenced by provider attestation and co-signature Inter-disciplinary care team collaboration (see longitudinal plan of care) Comprehensive medication review performed; medication list updated in electronic medical record  Hypertension (BP goal <130/80) -Controlled -Current treatment: Hydrochlorothiazide 3m daily  Lisinopril 151mdaily  -Medications previously tried: amlodipine   -Current home readings: 131/75, 130/75 -Current dietary habits: eats a sodium reduced diet -Current exercise habits: limited  -Denies hypotensive/hypertensive symptoms -Educated on BP goals and benefits of medications for prevention of heart attack, stroke and kidney damage; Daily salt intake goal < 2300 mg; Importance of home blood pressure monitoring; Symptoms of hypotension and importance of maintaining adequate hydration; -Counseled to monitor BP at home daily as she has been doing, document, and provide log at future appointments -Counseled on diet and exercise extensively Recommended to continue current medication  Hyperlipidemia: (LDL goal < 70) -Controlled  - Last LDL 8076mL (06/13/2020) -Current treatment: Rosuvastatin 5mg25m taking twice weekly  Fenofibrate 145mg66mly  -Medications previously tried: n/a  -Current dietary patterns: avoids fried fatty foods, has reduced her red meat intake  -Current exercise habits: limited  -Educated on Cholesterol goals;  Benefits of statin for ASCVD risk reduction; Importance of limiting foods high in cholesterol; -Counseled on diet and exercise extensively Recommended to continue current medication  Diabetes (A1c goal <7%) -Controlled  - Last A1c 6.8% (06/13/2020) -Last GFR 48.98 mL/min  (06/13/2020) -Current medications: Metformin 500mg 40me daily  -Medications previously tried: checking 2-3 times weekly   -Current home glucose readings fasting glucose: 130-140 -Reports hypoglycemic/hyperglycemic symptoms -Current meal patterns:  breakfast: egg whites +/- toast, grits - at times does not eat   lunch: may not eat / may be her breakfast   dinner: homemade, meat, vegetable, and a small amount of starch/ carb snacks: chips drinks: water, on rare occasion Kool-Aid, coffee w/ creamer  -Current exercise: limited  -Educated on A1c and blood sugar goals; Complications of diabetes including kidney damage, retinal damage, and  cardiovascular disease; Prevention and management of hypoglycemic episodes;  Benefits of routine self-monitoring of blood sugar; -Counseled to check feet daily and get yearly eye exams -Counseled on diet and exercise extensively Recommended to continue current medication  Anxiety (Goal: Prevention of Anxiety Attack / Mood Control ) -Not ideally controlled per patient better when taking alprazolam -Current treatment: Alprazolam 0.69m twice daily as needed for anxiety (reports that she has been taking since 2012) -Medications previously tried/failed: wellbutrin, citalopram, duloxetine, elavil  -GAD7: 13 - patient notes that she feels her screening is a major improvement since starting medication -Educated on Benefits of medication for symptom control Benefits of cognitive-behavioral therapy with or without medication -Recommended to continue current medication Educated on using alprazolam no more than as prescribed, advised patient to use only as needed and to not take on a scheduled basis   Gout (Goal: Prevention of gout attacks / treatment of acute attacks ) -Controlled -Current treatment  Colchicine 0.666m- 2 tablets x 1 dose then 1 tablet 1 hour later if needed  -Medications previously tried: n/a  -Recommended to continue current medication Counseled on diet, avoiding foods that can exacerbate / lead to acute flares    Peripheral Neuropathy (Goal: Pain control ) -Not ideally controlled  - Last estimated CrCl = 60.151 mL/min -Current treatment  Gabapentin 60028mn morning and 1200m90m bedtime  -Medications previously tried: duloxetine, lyrica  -Recommended for patient to continue current medication, would prefer to keep gabapentin dosage at no more than 1800mg82mly to help prevent possible worsening with cognitive/ memory concerns - discussed with patient about possible referral to neurology which she preferred to wait on at this time, aware she can reach out for referral if she  feel neuropathy is worsening   Tobacco use (Goal Smoking Cessation) -Not ideally controlled -Previous quit attempts: Had stopped smoking for 13 days previously while admitted after surgery also had quit 4-6 months prior to COVID - restarted due to stress from COVID  -Current tobacco usage  1 pack can last her about 4-5 days - depending on stress levels can go 2-3 days at a time without smoking a cigarette Notes to infrequent vape usage  -Patient smokes After 30 minutes of waking -Patient triggers include: stress and anxiety, finishing a meal, and going to a social event and seeing someone else smoke -On a scale of 1-10, reports MOTIVATION to quit is 6-7 -On a scale of 1-10, reports CONFIDENCE in quitting is 8-9 -Provided contact information for [place] [employer]-[email or web address]) and encouraged patient to reach out to this group for support.  -Recommended for patient to establish a quit date before next appointment (3 months)    Patient Goals/Self-Care Activities Patient will:  - take medications as prescribed check glucose daily, document, and provide at future appointments check blood pressure daily, document, and provide at future appointments Establish smoking quit date before next appointment   Follow Up Plan: Telephone follow up appointment with care management team member scheduled for: The patient has been provided with contact information for the care management team and has been advised to call with any health related questions or concerns.        Medication Assistance: None required.  Patient affirms current coverage meets needs.  Patient[facility] [id number]: [phone] pill box? No - reports that she turns bottle over after she takes  in the morning, turns over at the end of the day  Pt  endorses 100% compliance  Care Plan and Follow Up Patient Decision:  Patient agrees to Care Plan and Follow-up.  Plan: Telephone follow up appointment with care management team member scheduled for:  3 months  and The patient has been provided with contact information for the care management team and has been advised to call with any health related questions or concerns.   Tomasa Blase, PharmD Clinical Pharmacist, [place]  09/20/20

## 2020-09-20 ENCOUNTER — Ambulatory Visit (INDEPENDENT_AMBULATORY_CARE_PROVIDER_SITE_OTHER): Payer: Medicare Other

## 2020-09-20 ENCOUNTER — Other Ambulatory Visit: Payer: Self-pay | Admitting: Internal Medicine

## 2020-09-20 ENCOUNTER — Other Ambulatory Visit: Payer: Self-pay

## 2020-09-20 DIAGNOSIS — E1143 Type 2 diabetes mellitus with diabetic autonomic (poly)neuropathy: Secondary | ICD-10-CM | POA: Diagnosis not present

## 2020-09-20 DIAGNOSIS — I10 Essential (primary) hypertension: Secondary | ICD-10-CM | POA: Diagnosis not present

## 2020-09-20 DIAGNOSIS — G6289 Other specified polyneuropathies: Secondary | ICD-10-CM

## 2020-09-20 DIAGNOSIS — F419 Anxiety disorder, unspecified: Secondary | ICD-10-CM

## 2020-09-20 DIAGNOSIS — E781 Pure hyperglyceridemia: Secondary | ICD-10-CM

## 2020-09-20 DIAGNOSIS — I700 Atherosclerosis of aorta: Secondary | ICD-10-CM

## 2020-09-20 DIAGNOSIS — Z8739 Personal history of other diseases of the musculoskeletal system and connective tissue: Secondary | ICD-10-CM

## 2020-09-20 DIAGNOSIS — F17210 Nicotine dependence, cigarettes, uncomplicated: Secondary | ICD-10-CM

## 2020-09-20 NOTE — Patient Instructions (Signed)
Visit Information   PATIENT GOALS:   Goals Addressed             This Visit's Progress    Monitor and Manage My Blood Sugar-Diabetes Type 2       Timeframe:  Long-Range Goal Priority:  High Start Date:  09/20/2020                           Expected End Date:  03/22/2021                     Follow Up Date 12/20/2020   - check blood sugar at prescribed times - check blood sugar if I feel it is too high or too low - enter blood sugar readings and medication or insulin into daily log - take the blood sugar log to all doctor visits    Why is this important?   Checking your blood sugar at home helps to keep it from getting very high or very low.  Writing the results in a diary or log helps the doctor know how to care for you.  Your blood sugar log should have the time, date and the results.  Also, write down the amount of insulin or other medicine that you take.  Other information, like what you ate, exercise done and how you were feeling, will also be helpful.     Notes: Patient to reach out should blood sugars elevate out of goal range (<150) or should she experience more frequent hypoglycemic episodes       Track and Manage My Blood Pressure-Hypertension       Timeframe:  Long-Range Goal Priority:  High Start Date:   09/20/2020                          Expected End Date:  03/22/2021                    Follow Up Date 12/20/2020    - check blood pressure daily    Why is this important?   You won't feel high blood pressure, but it can still hurt your blood vessels.  High blood pressure can cause heart or kidney problems. It can also cause a stroke.  Making lifestyle changes like losing a little weight or eating less salt will help.  Checking your blood pressure at home and at different times of the day can help to control blood pressure.  If the doctor prescribes medicine remember to take it the way the doctor ordered.  Call the office if you cannot afford the medicine or if there  are questions about it.     Notes: Patient to reach out to clinic with blood pressures that are averaging above goal range (<130/80)         Consent to CCM Services: Ms. Tankard was given information about Chronic Care Management services today including:  CCM service includes personalized support from designated clinical staff supervised by her physician, including individualized plan of care and coordination with other care providers 24/7 contact phone numbers for assistance for urgent and routine care needs. Service will only be billed when office clinical staff spend 20 minutes or more in a month to coordinate care. Only one practitioner may furnish and bill the service in a calendar month. The patient may stop CCM services at any time (effective at the end of the month) by phone call  to the office staff. The patient will be responsible for cost sharing (co-pay) of up to 20% of the service fee (after annual deductible is met).  Patient agreed to services and verbal consent obtained.   The patient verbalized understanding of instructions, educational materials, and care plan provided today and declined offer to receive copy of patient instructions, educational materials, and care plan.   Telephone follow up appointment with care management team member scheduled for: 3 months  The patient has been provided with contact information for the care management team and has been advised to call with any health related questions or concerns.   Tomasa Blase, PharmD Clinical Pharmacist, [place]     CLINICAL CARE PLAN: Patient Care Plan: CCM Care Plan     Problem Identified: HTN, HLD, DM2, Gout, Anxiety, Neuropathy, Smoking Cessation   Priority: High     Long-Range Goal: Disease Management   Start Date: 09/20/2020  Expected End Date: 03/22/2021  This Visit's Progress: On track  Priority: High  Note:   Current Barriers:  Unable to independently monitor  therapeutic efficacy  Pharmacist Clinical Goal(s):  Patient will achieve adherence to monitoring guidelines and medication adherence to achieve therapeutic efficacy maintain control of BP, Blood sugars, and LDL as evidenced by BP and blood sugar logs / next lipid panel  Smoking cessation through collaboration with PharmD and provider.   Interventions: 1:1 collaboration with Binnie Rail, MD regarding development and update of comprehensive plan of care as evidenced by provider attestation and co-signature Inter-disciplinary care team collaboration (see longitudinal plan of care) Comprehensive medication review performed; medication list updated in electronic medical record  Hypertension (BP goal <130/80) -Controlled -Current treatment: Hydrochlorothiazide 67m daily  Lisinopril 155mdaily  -Medications previously tried: amlodipine  -Current home readings: 131/75, 130/75 -Current dietary habits: eats a sodium reduced diet -Current exercise habits: limited  -Denies hypotensive/hypertensive symptoms -Educated on BP goals and benefits of medications for prevention of heart attack, stroke and kidney damage; Daily salt intake goal < 2300 mg; Importance of home blood pressure monitoring; Symptoms of hypotension and importance of maintaining adequate hydration; -Counseled to monitor BP at home daily as she has been doing, document, and provide log at future appointments -Counseled on diet and exercise extensively Recommended to continue current medication  Hyperlipidemia: (LDL goal < 70) -Controlled  - Last LDL 8027mL (06/13/2020) -Current treatment: Rosuvastatin 5mg104m taking twice weekly  Fenofibrate 145mg74mly  -Medications previously tried: n/a  -Current dietary patterns: avoids fried fatty foods, has reduced her red meat intake  -Current exercise habits: limited  -Educated on Cholesterol goals;  Benefits of statin for ASCVD risk reduction; Importance of limiting foods high in  cholesterol; -Counseled on diet and exercise extensively Recommended to continue current medication  Diabetes (A1c goal <7%) -Controlled  - Last A1c 6.8% (06/13/2020) -Last GFR 48.98 mL/min  (06/13/2020) -Current medications: Metformin 500mg 56me daily  -Medications previously tried: checking 2-3 times weekly   -Current home glucose readings fasting glucose: 130-140 -Reports hypoglycemic/hyperglycemic symptoms -Current meal patterns:  breakfast: egg whites +/- toast, grits - at times does not eat   lunch: may not eat / may be her breakfast   dinner: homemade, meat, vegetable, and a small amount of starch/ carb snacks: chips drinks: water, on rare occasion Kool-Aid, coffee w/ creamer  -Current exercise: limited  -Educated on A1c and blood sugar goals; Complications of diabetes including kidney damage, retinal damage, and cardiovascular disease; Prevention and management of hypoglycemic episodes;  Benefits of routine self-monitoring of blood sugar; -Counseled to check feet daily and get yearly eye exams -Counseled on diet and exercise extensively Recommended to continue current medication  Anxiety (Goal: Prevention of Anxiety Attack / Mood Control ) -Not ideally controlled per patient better when taking alprazolam -Current treatment: Alprazolam 0.8m twice daily as needed for anxiety (reports that she has been taking since 2012) -Medications previously tried/failed: wellbutrin, citalopram, duloxetine, elavil  -GAD7: 13 - patient notes that she feels her screening is a major improvement since starting medication -Educated on Benefits of medication for symptom control Benefits of cognitive-behavioral therapy with or without medication -Recommended to continue current medication Educated on using alprazolam no more than as prescribed, advised patient to use only as needed and to not take on a scheduled basis   Gout (Goal: Prevention of gout attacks / treatment of acute attacks  ) -Controlled -Current treatment  Colchicine 0.668m- 2 tablets x 1 dose then 1 tablet 1 hour later if needed  -Medications previously tried: n/a  -Recommended to continue current medication Counseled on diet, avoiding foods that can exacerbate / lead to acute flares    Peripheral Neuropathy (Goal: Pain control ) -Not ideally controlled  - Last estimated CrCl = 60.151 mL/min -Current treatment  Gabapentin 60049mn morning and 1200m77m bedtime  -Medications previously tried: duloxetine, lyrica  -Recommended for patient to continue current medication, would prefer to keep gabapentin dosage at no more than 1800mg96mly to help prevent possible worsening with cognitive/ memory concerns - discussed with patient about possible referral to neurology which she preferred to wait on at this time, aware she can reach out for referral if she feel neuropathy is worsening   Tobacco use (Goal Smoking Cessation) -Not ideally controlled -Previous quit attempts: Had stopped smoking for 13 days previously while admitted after surgery also had quit 4-6 months prior to COVID - restarted due to stress from COVID  -Current tobacco usage  1 pack can last her about 4-5 days - depending on stress levels can go 2-3 days at a time without smoking a cigarette Notes to infrequent vape usage  -Patient smokes After 30 minutes of waking -Patient triggers include: stress and anxiety, finishing a meal, and going to a social event and seeing someone else smoke -On a scale of 1-10, reports MOTIVATION to quit is 6-7 -On a scale of 1-10, reports CONFIDENCE in quitting is 8-9 -Provided contact information for [place] [email or web address]) and encouraged patient to reach out to this group for support.  -Recommended for patient to establish a quit date before next appointment (3 months)    Patient Goals/Self-Care Activities Patient will:  - take medications as prescribed check glucose daily, document, and provide at future  appointments check blood pressure daily, document, and provide at future appointments Establish smoking quit date before next appointment   Follow Up Plan: Telephone follow up appointment with care management team member scheduled for: The patient has been provided with contact information for the care management team and has been advised to call with any health related questions or concerns.

## 2020-10-02 NOTE — Progress Notes (Signed)
Subjective:    Patient ID: [name], female    DOB: [birth year], 65 y.o.   MRN: [id number]  HPI The patient is here for an acute visit.   Discuss podiatry referral -she had a home nurse visit recently and they had discussed seeing a podiatrist for her calluses on the bottom of her feet.  She is interested in seeing someone.  She does have peripheral neuropathy and is decreased sensation in her feet.  She does monitor her feet.  She states the calluses have gotten larger over time.  She denies any pain from them.     Medications and allergies reviewed with patient and updated if appropriate.  Patient Active Problem List   Diagnosis Date Noted   History of gout 02/15/2020   Alcohol abuse    Hypertriglyceridemia 06/13/2019   Aortic atherosclerosis ([place]) 06/13/2019   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place [facility]) 12/13/2018   B12 deficiency 06/15/2016   Anxiety 06/12/2016   Type 2 diabetes mellitus with diabetic autonomic neuropathy, without long-term current use of insulin ([place]) 01/10/2016   Peristomal hernia 04/19/2012   Seborrheic dermatitis 08/09/2010   PERSONAL HX RECTAL CANCER 06/05/2010   Tobacco dependence due to cigarettes 04/16/2010   ALLERGIC RHINITIS 04/16/2010   Peripheral neuropathy ([place]) 12/06/2009   Essential hypertension 12/06/2009   Diarrhea 12/06/2009    Current Outpatient Medications on File Prior to Visit  Medication Sig Dispense Refill   ALPRAZolam (XANAX) 0.5 MG tablet TAKE 1 TABLET BY MOUTH  TWICE DAILY AS NEEDED FOR  ANXIETY 90 tablet 0   blood glucose meter kit and supplies KIT Dispense based on patient and insurance preference. Use up to four times daily as directed. (FOR E11.9). 1 each 0   Blood Glucose Monitoring Suppl (ONE TOUCH ULTRA 2) w/Device KIT Use as directed to check sugars once daily and as needed.  Dx E11.43 1 kit 0   colchicine 0.6 MG tablet Take 2 tabs x 1 and then 1 tab one hour later for gout flare 21 tablet 1   fenofibrate (TRICOR) 145 MG  tablet TAKE 1 TABLET BY MOUTH ONCE DAILY 30 tablet 11   gabapentin (NEURONTIN) 600 MG tablet 1 tab in morning and afternoon, take 2 tabs at night (Patient taking differently: 1 tab in morning and take 2 tabs at night) 120 tablet 11   glucose blood (FREESTYLE LITE) test strip Use as instructed 100 each 12   hydrochlorothiazide (HYDRODIURIL) 25 MG tablet TAKE 1 TABLET BY MOUTH  DAILY 90 tablet 3   Lancets (ONETOUCH ULTRASOFT) lancets Use as directed to check sugars once daily and as needed.  Dx E11.43 100 each 12   lisinopril (ZESTRIL) 10 MG tablet TAKE 1 TABLET BY MOUTH  DAILY 90 tablet 3   metFORMIN (GLUCOPHAGE) 500 MG tablet TAKE 1 TABLET BY MOUTH  TWICE DAILY WITH MEALS 180 tablet 3   rosuvastatin (CRESTOR) 5 MG tablet Take 1 tablet (5 mg total) by mouth 2 (two) times a week. 13 tablet 3   No current facility-administered medications on file prior to visit.    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   LAR surg 02/2009, chemo thru 09/2009   Tibial plateau fracture, left 01/19/2014    Past Surgical History:  Procedure Laterality Date   APPENDECTOMY N/A 01/09/2015   Procedure: APPENDECTOMY;  Surgeon: Excell Seltzer, MD;  Location: [facility] ORS;  Service: General;  Laterality: N/A;   COLONOSCOPY  01/09/2015   [facility]   COLOSTOMY  2013   [facility]   FINGER FRACTURE SURGERY Left    and hand surgery   HERNIA REPAIR     LAPAROSCOPIC PARASTOMAL HERNIA N/A 05/12/2013   Procedure: LAPAROSCOPIC PARASTOMAL HERNIA;  Surgeon: Leighton Ruff, MD;  Location: [facility]  ORS;  Service: General;  Laterality: N/A;   LAPAROTOMY N/A 01/09/2015   Procedure: EXPLORATORY LAPAROTOMY;  Surgeon: Excell Seltzer, MD;  Location: [facility]S;  Service: General;  Laterality: N/A;  RESECTION OF COLOSTOMY, CREATION OF NEW COLOSTOMY, VENTRAL HERNIA REPAIR WITH BIOLOGIC MESH,  LYSIS OF ADHESIONS , OMENTECTOMY   LOW ANTERIOR BOWEL RESECTION  03/07/09   MOUTH SURGERY  10/2009   PORTACATH PLACEMENT     and removal    Social History   Socioeconomic History   Marital status: Significant Other    Spouse name: Not on file   Number of children: 0   Years of education: Not on file   Highest education level: Not on file  Occupational History   Occupation: disabiled  Tobacco Use   Smoking status: Every Day    Packs/day: 0.25    Years: 40.00    Pack years: 10.00    Types: Cigarettes   Smokeless tobacco: Never   Tobacco comments:    Pt. in project free program, motivated to stop smoking completely. Declined smoking cessation [facility].  Vaping Use   Vaping Use: Never used  Substance and Sexual Activity   Alcohol use: Yes    Alcohol/week: 18.0 standard drinks    Types: 14 Cans of beer, 4 Glasses of wine per week   Drug use: Yes    Frequency: 1.0 times per week    Types: Marijuana, Cocaine    Comment: Pt is using Marijuana; last use of cocaine nov 2014    Sexual activity: Not on file  Other Topics Concern   Not on file  Social History Narrative   Divorced, has significant other for past 30 years; values spending time with 2 grandsons and 73 year old dog who lives with significant other   Frequent moves between family with stressors in relationship   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, jewellery, Psychologist, 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, 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   Social Determinants of Health   Financial Resource Strain: Low Risk    Difficulty of Paying Living Expenses: Not hard at all  Food Insecurity: No Food Insecurity    Worried About Charity fundraiser in the Last Year: Never true   Arboriculturist in the Last Year: Never true  Transportation Needs: No Transportation Needs   Lack of Transportation (Medical): No   Lack of Transportation (Non-Medical): No  Physical Activity: Sufficiently Active   Days of Exercise per Week: 5 days   Minutes of Exercise per Session: 30 min  Stress: Stress Concern Present   Feeling of Stress : Rather much  Social Connections: Unknown   Frequency  of Communication with Friends and Family: More than three times a week   Frequency of Social Gatherings with Friends and Family: More than three times a week   Attends Religious Services: Patient refused   Active Member of Clubs or Organizations: Patient refused   Attends Archivist Meetings: Patient refused   Marital Status: Living with partner    Family History  Problem Relation Age of Onset   Colon cancer Mother 30   Diabetes Father    Cirrhosis Brother    Stomach cancer Sister    Colon polyps Sister    Pulmonary Hypertension Brother    Esophageal cancer Neg Hx    Rectal cancer Neg Hx     Review of Systems     Objective:   Vitals:   10/03/20 1117  BP: 122/68  Pulse: 73  Temp: 99.1 F (37.3 C)  SpO2: 97%   BP Readings from Last 3 Encounters:  10/03/20 122/68  06/13/20 130/78  03/01/20 (!) 116/57   Wt Readings from Last 3 Encounters:  10/03/20 169 lb (76.7 kg)  06/13/20 175 lb (79.4 kg)  03/01/20 177 lb (80.3 kg)   Body mass index is 27.28 kg/m.   Physical Exam     Diabetic Foot Exam - Simple   Simple Foot Form Diabetic Foot exam was performed with the following findings: Yes 10/03/2020 12:44 PM  Visual Inspection See comments: Yes Sensation Testing See comments: Yes Pulse Check Posterior Tibialis and Dorsalis pulse intact bilaterally: Yes Comments Bilateral decreased sensation on plantar surfaces to light touch.  Large callus formation bilateral ball of feet-nontender to  palpation        Assessment & Plan:    Discussed covid booster and shingrix.   See Problem List for Assessment and Plan of chronic medical problems.    This visit occurred during the SARS-CoV-2 public health emergency.  Safety protocols were in place, including screening questions prior to the visit, additional usage of staff PPE, and extensive cleaning of exam room while observing appropriate contact time as indicated for disinfecting solutions.

## 2020-10-03 ENCOUNTER — Other Ambulatory Visit: Payer: Self-pay

## 2020-10-03 ENCOUNTER — Encounter: Payer: Self-pay | Admitting: Internal Medicine

## 2020-10-03 ENCOUNTER — Ambulatory Visit (INDEPENDENT_AMBULATORY_CARE_PROVIDER_SITE_OTHER): Payer: Medicare Other | Admitting: Internal Medicine

## 2020-10-03 VITALS — BP 122/68 | HR 73 | Temp 99.1°F | Ht 66.0 in | Wt 169.0 lb

## 2020-10-03 DIAGNOSIS — L84 Corns and callosities: Secondary | ICD-10-CM

## 2020-10-03 DIAGNOSIS — I10 Essential (primary) hypertension: Secondary | ICD-10-CM

## 2020-10-03 DIAGNOSIS — G6289 Other specified polyneuropathies: Secondary | ICD-10-CM

## 2020-10-03 DIAGNOSIS — E1143 Type 2 diabetes mellitus with diabetic autonomic (poly)neuropathy: Secondary | ICD-10-CM

## 2020-10-03 NOTE — Patient Instructions (Addendum)
    Call and schedule your mammogram - [facility] Schedule an appointment by calling [phone]     A referral was ordered for podiatry.      Someone from their office will call you to schedule an appointment.      Get the covid booster and shingles vaccine at the pharmacy.

## 2020-10-03 NOTE — Assessment & Plan Note (Addendum)
Chronic Related to chemotherapy Taking gabapentin 600 mg in morning and afternoon and 1200 mg at night-continue Has been decreased sensation in feet and bilateral calluses Will refer to podiatry

## 2020-10-03 NOTE — Assessment & Plan Note (Signed)
Chronic Well-controlled Continue hydrochlorothiazide 25 mg daily, lisinopril 10 mg daily

## 2020-10-03 NOTE — Assessment & Plan Note (Signed)
Chronic Lab Results  Component Value Date   HGBA1C 6.8 (H) 06/13/2020   Sugars controlled Continue metformin 500 mg twice daily

## 2020-10-08 ENCOUNTER — Other Ambulatory Visit: Payer: Self-pay | Admitting: Internal Medicine

## 2020-10-17 ENCOUNTER — Encounter: Payer: Self-pay | Admitting: Podiatrist

## 2020-10-17 ENCOUNTER — Ambulatory Visit: Payer: Medicare Other | Admitting: Podiatrist

## 2020-10-17 ENCOUNTER — Other Ambulatory Visit: Payer: Self-pay

## 2020-10-17 DIAGNOSIS — L84 Corns and callosities: Secondary | ICD-10-CM | POA: Diagnosis not present

## 2020-10-17 DIAGNOSIS — E1143 Type 2 diabetes mellitus with diabetic autonomic (poly)neuropathy: Secondary | ICD-10-CM | POA: Diagnosis not present

## 2020-10-17 DIAGNOSIS — B351 Tinea unguium: Secondary | ICD-10-CM

## 2020-10-17 NOTE — Patient Instructions (Signed)

## 2020-10-24 ENCOUNTER — Telehealth: Payer: Self-pay | Admitting: Internal Medicine

## 2020-10-24 ENCOUNTER — Telehealth: Payer: Self-pay | Admitting: Podiatrist

## 2020-10-24 NOTE — Telephone Encounter (Signed)
    Patient requesting recommendation for GYN (not [facility]) for vaginal bleeding  Please call

## 2020-10-24 NOTE — Telephone Encounter (Signed)
Left message for pt to call to schedule an appt for diabetic shoe measurements per Dr Valentina Lucks.[name]

## 2020-10-24 NOTE — Telephone Encounter (Signed)
Pt notified of PCP response. Given Dr Talbert Nan number. Pt states she now believes it could be urinary as she has not had a cycle "in years", but she is still unsure b/c it is accompanied with mild cramping.  Pt denies urinary symptoms & admits she does not hydrate well.  Pt encouraged to make appt with an in office provider is symptoms persist or get worse if she believes it could be urinary. Pt verb understanding.

## 2020-10-24 NOTE — Telephone Encounter (Signed)
Can consider Dr. Talbert Nan with [facility]

## 2020-10-24 NOTE — Progress Notes (Signed)
Subjective: [name] is a 65 y.o. female patient with history of diabetes who presents to office today complaining of long,mildly painful nails  while ambulating in shoes; unable to trim. She also has painful calluses on the  plantar forefoot region bilateral feet.  The patient is a cancer survivor and relates that she has neuropathy post chemotherapy.  She denies any new changes in past medical history medications or allergies.  Her last hemoglobin A1c was 6.8.    Patient Active Problem List   Diagnosis Date Noted   History of gout 02/15/2020   Alcohol abuse    Hypertriglyceridemia 06/13/2019   Aortic atherosclerosis ([place]) 06/13/2019   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place [facility]) 12/13/2018   B12 deficiency 06/15/2016   Anxiety 06/12/2016   Type 2 diabetes mellitus with diabetic autonomic neuropathy, without long-term current use of insulin ([place]) 01/10/2016   Peristomal hernia 04/19/2012   Seborrheic dermatitis 08/09/2010   PERSONAL HX RECTAL CANCER 06/05/2010   Tobacco dependence due to cigarettes 04/16/2010   ALLERGIC RHINITIS 04/16/2010   Peripheral neuropathy ([place]) 12/06/2009   Essential hypertension 12/06/2009   Diarrhea 12/06/2009   Current Outpatient Medications on File Prior to Visit  Medication Sig Dispense Refill   ALPRAZolam (XANAX) 0.5 MG tablet TAKE 1 TABLET BY MOUTH  TWICE DAILY AS NEEDED FOR  ANXIETY 90 tablet 0   Blood Glucose Monitoring Suppl (ONE TOUCH ULTRA 2) w/Device KIT Use as directed to check sugars once daily and as needed.  Dx E11.43 1 kit 0   colchicine 0.6 MG tablet TAKE 2 TABLETS BY MOUTH FOR ONE DOSE AND THEN 1 TABLET  BY MOUTH 1 HOUR LATER FOR  GOUT FLARE 21 tablet 3   fenofibrate (TRICOR) 145 MG tablet TAKE 1 TABLET BY MOUTH ONCE DAILY 90 tablet 3   gabapentin (NEURONTIN) 600 MG tablet TAKE 1 TABLET BY MOUTH 3  TIMES DAILY 270 tablet 3   glucose blood (FREESTYLE LITE) test strip Use as instructed 100 each 12   hydrochlorothiazide (HYDRODIURIL) 25 MG tablet  TAKE 1 TABLET BY MOUTH  DAILY 90 tablet 3   Lancets (ONETOUCH ULTRASOFT) lancets Use as directed to check sugars once daily and as needed.  Dx E11.43 100 each 12   lisinopril (ZESTRIL) 10 MG tablet TAKE 1 TABLET BY MOUTH  DAILY 90 tablet 3   metFORMIN (GLUCOPHAGE) 500 MG tablet TAKE 1 TABLET BY MOUTH  TWICE DAILY WITH MEALS 180 tablet 3   rosuvastatin (CRESTOR) 5 MG tablet Take 1 tablet (5 mg total) by mouth 2 (two) times a week. 13 tablet 3   No current facility-administered medications on file prior to visit.   Allergies  Allergen Reactions   Ampicillin Hives   Hydrocodone Itching    Can take Oxycodone and Codeine   Penicillins Other (See Comments)    Unknown reaction   Grapeseed Extract [Nutritional Supplements] Rash     Objective: General: Patient is awake, alert, and oriented x 3 and in no acute distress.  Integument: Skin is warm, dry and supple bilateral. Nails are tender, long, thickened and  dystrophic with subungual debris, consistent with onychomycosis, 1-5 bilateral.  No open lesions or rashes noted hyperkeratotic lesion submetatarsal 2 through 4 bilateral is noted consistent with plantar callus.  Vasculature:  Dorsalis Pedis pulse /4 bilateral. Posterior Tibial pulse  /4 bilateral.  Capillary fill time <3 sec 1-5 bilateral. Positive hair growth to the level of the digits. Temperature gradient within normal limits. No varicosities present bilateral. No edema present bilateral.  Neurology: The patient has decreased sensation measured with a 5.07/10g Semmes Weinstein Monofilament  2/5 intact bilateral. Vibratory sensation diminished bilateral with tuning fork.   Musculoskeletal: Prominent metatarsal heads noted with a overlying callus present.  Hammertoes 2 through 5 bilateral also noted.  Muscular strength 5/5 in all lower extremity muscular groups bilateral without pain on range of motion . No tenderness with calf compression bilateral.  Assessment and Plan: 1. Type 2  diabetes mellitus with diabetic autonomic neuropathy, without long-term current use of insulin (HCC)   2. Callus of foot   3. Onychomycosis     -Examined patient. -Discussed and educated patient on diabetic foot care, especially with  regards to the vascular, neurological and musculoskeletal systems.  -Stressed the importance of good glycemic control and the detriment of not  controlling glucose levels in relation to the foot. -Mechanically debrided all nails 1-5 bilateral using sterile nail nipper and filed with dremel without incident  -Diabetic shoes are discussed and we will start the process on getting these for her. -Patient to return  in 3 months for at risk foot care -Patient advised to call the office if any problems or questions arise in the meantime.

## 2020-10-25 ENCOUNTER — Other Ambulatory Visit: Payer: Self-pay | Admitting: Internal Medicine

## 2020-10-25 DIAGNOSIS — F419 Anxiety disorder, unspecified: Secondary | ICD-10-CM

## 2020-11-01 ENCOUNTER — Other Ambulatory Visit: Payer: Self-pay | Admitting: Internal Medicine

## 2020-11-10 ENCOUNTER — Emergency Department (HOSPITAL_COMMUNITY): Payer: Medicare Other

## 2020-11-10 ENCOUNTER — Other Ambulatory Visit: Payer: Self-pay

## 2020-11-10 ENCOUNTER — Emergency Department (HOSPITAL_COMMUNITY)
Admission: EM | Admit: 2020-11-10 | Discharge: 2020-11-10 | Disposition: A | Discharge status: Home / Self Care | Payer: Medicare Other | Attending: Emergency Medicine | Admitting: Emergency Medicine

## 2020-11-10 DIAGNOSIS — Z79899 Other long term (current) drug therapy: Secondary | ICD-10-CM | POA: Insufficient documentation

## 2020-11-10 DIAGNOSIS — K3189 Other diseases of stomach and duodenum: Secondary | ICD-10-CM | POA: Diagnosis not present

## 2020-11-10 DIAGNOSIS — F17210 Nicotine dependence, cigarettes, uncomplicated: Secondary | ICD-10-CM | POA: Diagnosis not present

## 2020-11-10 DIAGNOSIS — I10 Essential (primary) hypertension: Secondary | ICD-10-CM | POA: Diagnosis not present

## 2020-11-10 DIAGNOSIS — N200 Calculus of kidney: Secondary | ICD-10-CM | POA: Insufficient documentation

## 2020-11-10 DIAGNOSIS — E1143 Type 2 diabetes mellitus with diabetic autonomic (poly)neuropathy: Secondary | ICD-10-CM | POA: Diagnosis not present

## 2020-11-10 DIAGNOSIS — Z7984 Long term (current) use of oral hypoglycemic drugs: Secondary | ICD-10-CM | POA: Diagnosis not present

## 2020-11-10 DIAGNOSIS — N132 Hydronephrosis with renal and ureteral calculous obstruction: Secondary | ICD-10-CM | POA: Diagnosis not present

## 2020-11-10 DIAGNOSIS — K429 Umbilical hernia without obstruction or gangrene: Secondary | ICD-10-CM | POA: Diagnosis not present

## 2020-11-10 DIAGNOSIS — K439 Ventral hernia without obstruction or gangrene: Secondary | ICD-10-CM | POA: Diagnosis not present

## 2020-11-10 DIAGNOSIS — R109 Unspecified abdominal pain: Secondary | ICD-10-CM | POA: Diagnosis present

## 2020-11-10 LAB — CBC WITH DIFFERENTIAL/PLATELET
Abs Immature Granulocytes: 0.1 10*3/uL — ABNORMAL HIGH (ref 0.00–0.07)
Basophils Absolute: 0.1 10*3/uL (ref 0.0–0.1)
Basophils Relative: 1 %
Eosinophils Absolute: 0.1 10*3/uL (ref 0.0–0.5)
Eosinophils Relative: 1 %
HCT: 39.6 % (ref 36.0–46.0)
Hemoglobin: 12.7 g/dL (ref 12.0–15.0)
Immature Granulocytes: 1 %
Lymphocytes Relative: 15 %
Lymphs Abs: 1.4 10*3/uL (ref 0.7–4.0)
MCH: 29.5 pg (ref 26.0–34.0)
MCHC: 32.1 g/dL (ref 30.0–36.0)
MCV: 91.9 fL (ref 80.0–100.0)
Monocytes Absolute: 0.6 10*3/uL (ref 0.1–1.0)
Monocytes Relative: 6 %
Neutro Abs: 7.3 10*3/uL (ref 1.7–7.7)
Neutrophils Relative %: 76 %
Platelets: 351 10*3/uL (ref 150–400)
RBC: 4.31 MIL/uL (ref 3.87–5.11)
RDW: 14.6 % (ref 11.5–15.5)
WBC: 9.5 10*3/uL (ref 4.0–10.5)
nRBC: 0 % (ref 0.0–0.2)

## 2020-11-10 LAB — URINALYSIS, ROUTINE W REFLEX MICROSCOPIC
Bacteria, UA: NONE SEEN
Bilirubin Urine: NEGATIVE
Glucose, UA: NEGATIVE mg/dL
Ketones, ur: NEGATIVE mg/dL
Leukocytes,Ua: NEGATIVE
Nitrite: NEGATIVE
Protein, ur: NEGATIVE mg/dL
RBC / HPF: >50 RBC/hpf — ABNORMAL HIGH (ref 0–5)
Specific Gravity, Urine: 1.013 (ref 1.005–1.030)
pH: 5 (ref 5.0–8.0)

## 2020-11-10 LAB — COMPREHENSIVE METABOLIC PANEL
ALT: 19 U/L (ref 0–44)
AST: 26 U/L (ref 15–41)
Albumin: 4.4 g/dL (ref 3.5–5.0)
Alkaline Phosphatase: 57 U/L (ref 38–126)
Anion gap: 14 (ref 5–15)
BUN: 48 mg/dL — ABNORMAL HIGH (ref 8–23)
CO2: 18 mmol/L — ABNORMAL LOW (ref 22–32)
Calcium: 9.8 mg/dL (ref 8.9–10.3)
Chloride: 105 mmol/L (ref 98–111)
Creatinine, Ser: 1.43 mg/dL — ABNORMAL HIGH (ref 0.44–1.00)
GFR, Estimated: 41 mL/min — ABNORMAL LOW (ref >60)
Glucose, Bld: 124 mg/dL — ABNORMAL HIGH (ref 70–99)
Potassium: 4.9 mmol/L (ref 3.5–5.1)
Sodium: 137 mmol/L (ref 135–145)
Total Bilirubin: 0.6 mg/dL (ref 0.3–1.2)
Total Protein: 7.6 g/dL (ref 6.5–8.1)

## 2020-11-10 MED ORDER — OXYCODONE-ACETAMINOPHEN 5-325 MG PO TABS: 1.0000 | ORAL_TABLET | Freq: Four times a day (QID) | ORAL | 0 refills | Status: DC | PRN

## 2020-11-10 MED ORDER — OXYCODONE-ACETAMINOPHEN 5-325 MG PO TABS
1.0000 | ORAL_TABLET | Freq: Once | ORAL | Status: AC
  Administered 2020-11-10: 1 via ORAL
  Filled 2020-11-10: qty 1

## 2020-11-10 MED ORDER — MORPHINE SULFATE (PF) 4 MG/ML IV SOLN
4.0000 mg | Freq: Once | INTRAVENOUS | Status: AC
  Administered 2020-11-10: 4 mg via INTRAVENOUS
  Filled 2020-11-10: qty 1

## 2020-11-10 MED ORDER — ONDANSETRON 4 MG PO TBDP: 4.0000 mg | ORAL_TABLET | Freq: Three times a day (TID) | ORAL | 0 refills | Status: DC | PRN

## 2020-11-10 MED ORDER — SODIUM CHLORIDE 0.9 % IV BOLUS
1000.0000 mL | Freq: Once | INTRAVENOUS | Status: AC
  Administered 2020-11-10: 1000 mL via INTRAVENOUS

## 2020-11-10 MED ORDER — ONDANSETRON HCL 4 MG/2ML IJ SOLN
4.0000 mg | Freq: Once | INTRAMUSCULAR | Status: AC
  Administered 2020-11-10: 4 mg via INTRAVENOUS
  Filled 2020-11-10: qty 2

## 2020-11-10 NOTE — Discharge Instructions (Addendum)
You were seen today and found to have a kidney stone.  This will likely pass on its own.  Make sure that you are increasing fluids.  Take medications as prescribed.

## 2020-11-10 NOTE — ED Triage Notes (Signed)
Complains of L flank pain that radiates to her L abdomen, reports a 'crazy sensation' when she pees. Nausea and 'watery' vomit.

## 2020-11-10 NOTE — ED Notes (Signed)
Pt has been able to tolerate water.  Pt asking for crackers.

## 2020-11-10 NOTE — ED Provider Notes (Signed)
[facility]-EMERGENCY DEPT Provider Note   CSN: [id number] Arrival date & time: 11/10/20  1050     History Chief Complaint  Patient presents with   Flank Pain    [name] is a 65 y.o. female.  HPI     This is a 65-year-old female with a history of hypertension, polysubstance use including alcohol, rectosigmoid cancer status post colostomy who presents with flank pain.  Patient reports that she got up this morning and noted acute onset of flank and abdominal pain.  She has had this once before approximately 6 years ago.  She reports that it is left-sided and radiates into her left abdomen.  She previously was diagnosed with a kidney stone with a similar symptoms.  She has had multiple episodes of nonbilious, nonbloody emesis.  She has not taken anything for her pain.  Rates her pain at 10 out of 10.  She states that she has not noted any hematuria or dysuria.  No fevers.  She states she did noted some hematuria 2 weeks ago.  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   LAR surg 02/2009, chemo thru 09/2009   Tibial plateau fracture, left 01/19/2014    Patient Active Problem List   Diagnosis Date Noted   History of gout 02/15/2020   Alcohol abuse    Hypertriglyceridemia 06/13/2019   Aortic atherosclerosis (HCC) 06/13/2019   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place (HCC) 12/13/2018   B12 deficiency 06/15/2016   Anxiety 06/12/2016   Type 2 diabetes mellitus with diabetic autonomic neuropathy, without long-term current use of  insulin (HCC) 01/10/2016   Peristomal hernia 04/19/2012   Seborrheic dermatitis 08/09/2010   PERSONAL HX RECTAL CANCER 06/05/2010   Tobacco dependence due to cigarettes 04/16/2010   ALLERGIC RHINITIS 04/16/2010   Peripheral neuropathy (HCC) 12/06/2009   Essential hypertension 12/06/2009   Diarrhea 12/06/2009    Past Surgical History:  Procedure Laterality Date   APPENDECTOMY N/A 01/09/2015   Procedure: APPENDECTOMY;  Surgeon: Benjamin Hoxworth, MD;  Location: [facility] ORS;  Service: General;  Laterality: N/A;   COLONOSCOPY  01/09/2015   [facility]   COLOSTOMY  2013   [facility]   FINGER FRACTURE SURGERY Left    and hand surgery   HERNIA REPAIR     LAPAROSCOPIC PARASTOMAL HERNIA N/A 05/12/2013   Procedure: LAPAROSCOPIC PARASTOMAL HERNIA;  Surgeon: Alicia Thomas, MD;  Location: [facility];  Service: General;  Laterality: N/A;   LAPAROTOMY N/A 01/09/2015   Procedure: EXPLORATORY LAPAROTOMY;  Surgeon: Benjamin Hoxworth, MD;  Location: [facility];  Service: General;  Laterality: N/A;  RESECTION OF COLOSTOMY, CREATION OF NEW COLOSTOMY, VENTRAL HERNIA REPAIR WITH BIOLOGIC MESH,  LYSIS OF ADHESIONS , OMENTECTOMY   LOW ANTERIOR BOWEL RESECTION  03/07/09   MOUTH SURGERY  10/2009   PORTACATH PLACEMENT     and removal     OB History   No obstetric history on file.       Family History  Problem Relation Age of Onset   Colon cancer Mother 89   Diabetes Father    Cirrhosis Brother    Stomach cancer Sister    Colon polyps Sister    Pulmonary Hypertension Brother    Esophageal cancer Neg Hx    Rectal cancer Neg Hx     Social History   Tobacco Use   Smoking status: Every Day    Packs/day: 0.25    Years: 40.00    Pack years: 10.00    Types: Cigarettes   Smokeless tobacco: Never   Tobacco comments:    Pt. in project free program, motivated to stop smoking completely. Declined smoking cessation cone program.  Vaping Use   Vaping Use: Never used  Substance Use Topics   Alcohol use: Yes     Alcohol/week: 18.0 standard drinks    Types: 14 Cans of beer, 4 Glasses of wine per week   Drug use: Yes    Frequency: 1.0 times per week    Types: Marijuana, Cocaine    Comment: Pt is using Marijuana; last use of cocaine nov 2014     Home Medications Prior to Admission medications   Medication Sig Start Date End Date Taking? Authorizing Provider  ondansetron (ZOFRAN ODT) 4 MG disintegrating tablet Take 1 tablet (4 mg total) by mouth every 8 (eight) hours as needed for nausea or vomiting. 11/10/20  Yes Tegan Burnside, Barbette Hair, MD  oxyCODONE-acetaminophen (PERCOCET/ROXICET) 5-325 MG tablet Take 1 tablet by mouth every 6 (six) hours as needed for severe pain. 11/10/20  Yes Dell Hurtubise, Barbette Hair, MD  ALPRAZolam Duanne Moron) 0.5 MG tablet TAKE 1 TABLET BY MOUTH  TWICE DAILY AS NEEDED FOR  ANXIETY 10/26/20   Janith Lima, MD  Blood Glucose Monitoring Suppl (ONE TOUCH ULTRA 2) w/Device KIT Use as directed to check sugars once daily and as needed.  Dx Y30.16 08/07/20   Binnie Rail, MD  colchicine 0.6 MG tablet TAKE 2 TABLETS BY MOUTH FOR ONE DOSE AND THEN 1 TABLET  BY MOUTH 1 HOUR LATER FOR  GOUT FLARE 10/09/20   Binnie Rail, MD  fenofibrate (TRICOR) 145 MG tablet TAKE 1 TABLET BY MOUTH ONCE DAILY 10/09/20   Binnie Rail, MD  gabapentin (NEURONTIN) 600 MG tablet TAKE 1 TABLET BY MOUTH 3  TIMES DAILY 10/09/20   Binnie Rail, MD  glucose blood (FREESTYLE LITE) test strip Use as instructed 07/26/20   Binnie Rail, MD  hydrochlorothiazide (HYDRODIURIL) 25 MG tablet TAKE 1 TABLET BY MOUTH  DAILY 05/28/20   Binnie Rail, MD  Lancets [facility] ULTRASOFT) lancets Use as directed to check sugars once daily and as needed.  Dx W10.93 08/07/20   Binnie Rail, MD  lisinopril (ZESTRIL) 10 MG tablet TAKE 1 TABLET BY MOUTH  DAILY 05/28/20   Binnie Rail, MD  metFORMIN (GLUCOPHAGE) 500 MG tablet TAKE 1 TABLET BY MOUTH  TWICE DAILY WITH MEALS 07/13/20   Burns, Claudina Lick, MD  rosuvastatin (CRESTOR) 5 MG tablet TAKE 1 TABLET  BY MOUTH  TWICE WEEKLY 11/01/20   Binnie Rail, MD    Allergies    Ampicillin, Hydrocodone, Penicillins, and Grapeseed extract [nutritional supplements]  Review of Systems   Review of Systems  Constitutional:  Negative for fever.  Respiratory:  Negative for shortness of breath.   Cardiovascular:  Negative for chest pain.  Gastrointestinal:  Positive for abdominal pain, nausea and vomiting.  Genitourinary:  Positive for flank pain and hematuria.  Negative for dysuria.  All other systems reviewed and are negative.  Physical Exam Updated Vital Signs BP 128/82   Pulse 62   Temp 98.2 F (36.8 C) (Oral)   Resp 18   Ht 1.676 m (5' 6")   Wt 79.4 kg   SpO2 98%   BMI 28.25 kg/m   Physical Exam Vitals and nursing note reviewed.  Constitutional:      Appearance: She is well-developed.     Comments: Elderly, uncomfortable appearing but nontoxic  HENT:     Head: Normocephalic and atraumatic.     Nose: Nose normal.     Mouth/Throat:     Mouth: Mucous membranes are moist.  Eyes:     Pupils: Pupils are equal, round, and reactive to light.  Cardiovascular:     Rate and Rhythm: Normal rate and regular rhythm.  Pulmonary:     Effort: Pulmonary effort is normal. No respiratory distress.  Abdominal:     General: Bowel sounds are normal.     Palpations: Abdomen is soft.     Comments: Extensive scarring noted over the abdomen, right-sided colostomy with liquid stool noted, no gross blood, large hernia over the left lower quadrant, no CVA tenderness  Musculoskeletal:     Cervical back: Neck supple.  Skin:    General: Skin is warm and dry.  Neurological:     General: No focal deficit present.     Mental Status: She is alert and oriented to person, place, and time.  Psychiatric:        Mood and Affect: Mood normal.    ED Results / Procedures / Treatments   Labs (all labs ordered are listed, but only abnormal results are displayed) Labs Reviewed  COMPREHENSIVE METABOLIC PANEL -  Abnormal; Notable for the following components:      Result Value   CO2 18 (*)    Glucose, Bld 124 (*)    BUN 48 (*)    Creatinine, Ser 1.43 (*)    GFR, Estimated 41 (*)    All other components within normal limits  CBC WITH DIFFERENTIAL/PLATELET - Abnormal; Notable for the following components:   Abs Immature Granulocytes 0.10 (*)    All other components within normal limits  URINALYSIS, ROUTINE W REFLEX MICROSCOPIC - Abnormal; Notable for the following components:   APPearance CLOUDY (*)    Hgb urine dipstick LARGE (*)    RBC / HPF >50 (*)    All other components within normal limits    EKG None  Radiology CT Renal Stone Study  Result Date: 11/10/2020 CLINICAL DATA:  Pt stating having left flank pain Hx of colostomy, colorectal, stones EXAM: CT ABDOMEN AND PELVIS WITHOUT CONTRAST TECHNIQUE: Multidetector CT imaging of the abdomen and pelvis was performed following the standard protocol without IV contrast. COMPARISON:  Previous exams. FINDINGS: Lower chest: No acute abnormality. Evaluation of the abdominal viscera limited by the lack of IV contrast. Hepatobiliary: No focal liver abnormality is seen. Normal appearance of the gallbladder. Pancreas: Unremarkable. No surrounding inflammatory changes. Spleen: Normal in size without focal abnormality. Adrenals/Urinary Tract: Adrenal glands are unremarkable. Normal appearance of the right kidney no hydronephrosis or renal calculi. No large renal mass. There is mild left hydronephrosis and ureterectasis secondary to an obstructing stone in the distal left ureter measuring 0.4 cm (series 5, image 108). No additional renal calculi identified. Normal appearance of the urinary bladder. Stomach/Bowel: Stomach is within normal limits. Status post transverse colectomy with right upper quadrant colostomy. Numerous colonic   diverticula without evidence of diverticulitis. No evidence of bowel obstruction or other inflammatory process. Presacral thickening  with calcification, similar to prior. Vascular/Lymphatic: Aortic atherosclerosis without aneurysm. No enlarged abdominal or pelvic lymph nodes. Reproductive: Prostate is unremarkable. Other: Large left paraumbilical ventral hernia containing multiple loops of nondilated small bowel. Multiple surgical clips along the anterior abdominal wall. Musculoskeletal: Multilevel degenerative disc disease in the lumbar spine worse at L5-S1. IMPRESSION: Mild left hydronephrosis and ureterectasis secondary to an obstructing 0.4 cm stone in the distal left ureter. Aortic Atherosclerosis (ICD10-I70.0). Electronically Signed   By: Audie Pinto M.D.   On: 11/10/2020 11:44    Procedures Procedures   Medications Ordered in ED Medications  sodium chloride 0.9 % bolus 1,000 mL (0 mLs Intravenous Stopped 11/10/20 1715)  morphine 4 MG/ML injection 4 mg (4 mg Intravenous Given 11/10/20 1545)  ondansetron (ZOFRAN) injection 4 mg (4 mg Intravenous Given 11/10/20 1544)  oxyCODONE-acetaminophen (PERCOCET/ROXICET) 5-325 MG per tablet 1 tablet (1 tablet Oral Given 11/10/20 1716)    ED Course  I have reviewed the triage vital signs and the nursing notes.  Pertinent labs & imaging results that were available during my care of the patient were reviewed by me and considered in my medical decision making (see chart for details).    MDM Rules/Calculators/A&P                           Patient presents with left-sided flank and abdominal pain.  She is uncomfortable appearing but nontoxic.  She has a history of kidney stones with similar pain.  This is very high on the differential.  Additional considerations include but not limited to, colitis, gastritis.  Patient was given pain and nausea medication as well as fluids.  Labs obtained.  She has hematuria.  Creatinine is at baseline.  CT stone study shows a 0.4 cm distal ureteral stone.  This is likely the culprit.  Patient was given oral pain medications.  She is able to tolerate  fluids and her pain is well controlled.  No recurrent vomiting.  Will discharge home with pain and nausea medication.  We will give urology follow-up.  After history, exam, and medical workup I feel the patient has been appropriately medically screened and is safe for discharge home. Pertinent diagnoses were discussed with the patient. Patient was given return precautions.  Final Clinical Impression(s) / ED Diagnoses Final diagnoses:  Kidney stone    Rx / DC Orders ED Discharge Orders          Ordered    oxyCODONE-acetaminophen (PERCOCET/ROXICET) 5-325 MG tablet  Every 6 hours PRN        11/10/20 1840    ondansetron (ZOFRAN ODT) 4 MG disintegrating tablet  Every 8 hours PRN        11/10/20 1840             Clance Baquero, Barbette Hair, MD 11/10/20 1844

## 2020-11-10 NOTE — ED Provider Notes (Signed)
Emergency Medicine Provider Triage Evaluation Note  [name] , a 65 y.o. female  was evaluated in triage.  Pt complains of abdominal pain.  Symptoms started about 6 to 7 hours ago.  She states they woke her from sleep.  Reports associated nausea with 2 episodes of vomiting this morning that she describes as clear.  She reports a history of colorectal cancer and has a permanent colostomy in place to the right side of her abdomen.  States that she has not had any significant fecal matter in her colostomy bag this morning.  Reports a "crazy sensation" when she urinates but otherwise has no urinary complaints.  Physical Exam  BP (!) 155/110 (BP Location: Right Arm)   Pulse 68   Temp 98.2 F (36.8 C) (Oral)   Resp 20   Ht '5\' 6"'$  (1.676 m)   Wt 79.4 kg   SpO2 (!) 85%   BMI 28.25 kg/m  Gen:   Awake, no distress   Resp:  Normal effort  MSK:   Moves extremities without difficulty  Other:  Colostomy noted to the right side of the abdomen.  Multiple well-healed surgical scars noted to the abdomen.  Large hernia noted to the left lower portion of the abdomen.  Tenderness appreciated along the left lateral aspect of the abdomen.  No flank pain.  Medical Decision Making  Medically screening exam initiated at 11:02 AM.  Appropriate orders placed.  [name] was informed that the remainder of the evaluation will be completed by another provider, this initial triage assessment does not replace that evaluation, and the importance of remaining in the ED until their evaluation is complete.   Rayna Sexton, PA-C 11/10/20 1110    Daleen Bo, MD 11/10/20 1640

## 2020-11-10 NOTE — ED Notes (Signed)
Patient has a urine culture in the main lab 

## 2020-11-13 ENCOUNTER — Telehealth: Payer: Self-pay | Admitting: Internal Medicine

## 2020-11-13 NOTE — Telephone Encounter (Signed)
Patient seeking advice for kidney stone. Patient went to ED 7/30 Seeking advice for pain Declined appointment    Seeking advice

## 2020-11-14 NOTE — Telephone Encounter (Signed)
Patient calling back to follow up on prev call from yesterday  Patient is experiencing pain w/ kidney stones & wants some advice from provider  Callback [phone] or [phone]

## 2020-11-14 NOTE — Telephone Encounter (Signed)
Spoke with patient today and advice given. 

## 2020-11-16 ENCOUNTER — Other Ambulatory Visit: Payer: Self-pay | Admitting: Internal Medicine

## 2020-11-29 ENCOUNTER — Telehealth: Payer: Self-pay

## 2020-11-29 NOTE — Progress Notes (Signed)
Chronic Care Management Pharmacy Assistant   Name: [name]  MRN: [id number] DOB: 09-Dec-1955   Reason for Encounter: Disease State General Assessment   Recent office visits:  10/03/20 Billey Gosling MD - polyneuropathy - Referred to podiatry  Recent consult visits:  10/17/20 Eulis Canner DPM - Diabetes follow up of Callus of foot, Onychomycosis - Follow up in 3 months  Hospital visits:  Medication Reconciliation was completed by comparing discharge summary, patient's EMR and Pharmacy list, and upon discussion with patient.  Admitted to the hospital on 11/10/20 due to Kidney stone. Discharge date was 11/10/20. Discharged from [facility]?Medications Started at [facility] Discharge:?? None noted  Medication Changes at Hospital Discharge: None noted  Medications Discontinued at Hospital Discharge: None noted  Medications that remain the same after Hospital Discharge:??  -All other medications will remain the same.    Medications: Outpatient Encounter Medications as of 11/29/2020  Medication Sig   ALPRAZolam (XANAX) 0.5 MG tablet TAKE 1 TABLET BY MOUTH  TWICE DAILY AS NEEDED FOR  ANXIETY   Blood Glucose Monitoring Suppl (ACCU-CHEK GUIDE) w/Device KIT USE TO CHECK BLOOD SUGAR  ONCE DAILY AND AS NEEDED   colchicine 0.6 MG tablet TAKE 2 TABLETS BY MOUTH FOR ONE DOSE AND THEN 1 TABLET  BY MOUTH 1 HOUR LATER FOR  GOUT FLARE   fenofibrate (TRICOR) 145 MG tablet TAKE 1 TABLET BY MOUTH ONCE DAILY   gabapentin (NEURONTIN) 600 MG tablet TAKE 1 TABLET BY MOUTH 3  TIMES DAILY   glucose blood (FREESTYLE LITE) test strip Use as instructed   hydrochlorothiazide (HYDRODIURIL) 25 MG tablet TAKE 1 TABLET BY MOUTH  DAILY   Lancets (ONETOUCH ULTRASOFT) lancets Use as directed to check sugars once daily and as needed.  Dx E11.43   lisinopril (ZESTRIL) 10 MG tablet TAKE 1 TABLET BY MOUTH  DAILY   metFORMIN (GLUCOPHAGE) 500 MG tablet TAKE 1 TABLET BY MOUTH  TWICE  DAILY WITH MEALS   ondansetron (ZOFRAN ODT) 4 MG disintegrating tablet Take 1 tablet (4 mg total) by mouth every 8 (eight) hours as needed for nausea or vomiting.   oxyCODONE-acetaminophen (PERCOCET/ROXICET) 5-325 MG tablet Take 1 tablet by mouth every 6 (six) hours as needed for severe pain.   rosuvastatin (CRESTOR) 5 MG tablet TAKE 1 TABLET BY MOUTH  TWICE WEEKLY   No facility-administered encounter medications on file as of 11/29/2020.   Have you had any problems recently with your health? Patient states that she is having back pain, and abdominal pain, Frequent urination, kidney stone and unable to sleep well at night currently.    Have you had any problems with your pharmacy? N/a  What issues or side effects are you having with your medications? N/a  What would you like me to pass along to [facility] for them to help you with?  N/a  Misc comments: Patient states that currently she is having constant back pain, that is currently at a 6, abdominal pain, and is not sleeping well. Patient advised that during urination she is having some pain. Per patient she is unsure if she has passed a kidney stone, because per patient she is currently having the same symptoms. When asked about pain meds patient advised that she is getting some relief but she is currently dealing with it. Patient also states that she gets up about 2-3 times a night and takes Alprazolam to help her sleep.    What can we do to take care of  you better? N/a  Star Rating Drugs: Hydrochlorothiazide 25 mg Lisinopril 10 mg Metformin 500 mg  Rosuvastatin 5 mg  Andee Poles, CMA

## 2020-12-20 ENCOUNTER — Other Ambulatory Visit: Payer: Self-pay

## 2020-12-20 ENCOUNTER — Ambulatory Visit (INDEPENDENT_AMBULATORY_CARE_PROVIDER_SITE_OTHER): Payer: Medicare Other

## 2020-12-20 ENCOUNTER — Other Ambulatory Visit: Payer: Self-pay | Admitting: Internal Medicine

## 2020-12-20 DIAGNOSIS — F17210 Nicotine dependence, cigarettes, uncomplicated: Secondary | ICD-10-CM

## 2020-12-20 DIAGNOSIS — I10 Essential (primary) hypertension: Secondary | ICD-10-CM

## 2020-12-20 DIAGNOSIS — E1143 Type 2 diabetes mellitus with diabetic autonomic (poly)neuropathy: Secondary | ICD-10-CM

## 2020-12-20 DIAGNOSIS — G6289 Other specified polyneuropathies: Secondary | ICD-10-CM

## 2020-12-20 NOTE — Patient Instructions (Signed)
Visit Information  PATIENT GOALS:  Goals Addressed             This Visit's Progress    Monitor and Manage My Blood Sugar-Diabetes Type 2   On track    Timeframe:  Long-Range Goal Priority:  High Start Date:  09/20/2020                           Expected End Date:  03/22/2021                     Follow Up Date 12/26/2020   - check blood sugar at prescribed times - check blood sugar if I feel it is too high or too low - enter blood sugar readings and medication or insulin into daily log - take the blood sugar log to all doctor visits    Why is this important?   Checking your blood sugar at home helps to keep it from getting very high or very low.  Writing the results in a diary or log helps the doctor know how to care for you.  Your blood sugar log should have the time, date and the results.  Also, write down the amount of insulin or other medicine that you take.  Other information, like what you ate, exercise done and how you were feeling, will also be helpful.     Notes: Patient to reach out should blood sugars elevate out of goal range (<150) or should she experience more frequent hypoglycemic episodes      Track and Manage My Blood Pressure-Hypertension   On track    Timeframe:  Long-Range Goal Priority:  High Start Date:   09/20/2020                          Expected End Date:  03/22/2021                    Follow Up Date 12/26/2020    - check blood pressure daily    Why is this important?   You won't feel high blood pressure, but it can still hurt your blood vessels.  High blood pressure can cause heart or kidney problems. It can also cause a stroke.  Making lifestyle changes like losing a little weight or eating less salt will help.  Checking your blood pressure at home and at different times of the day can help to control blood pressure.  If the doctor prescribes medicine remember to take it the way the doctor ordered.  Call the office if you cannot afford the medicine  or if there are questions about it.     Notes: Patient to reach out to clinic with blood pressures that are averaging above goal range (<130/80)        Patient verbalizes understanding of instructions provided today and agrees to view in [place].   Telephone follow up appointment with care management team member scheduled for: 1 week The patient has been provided with contact information for the care management team and has been advised to call with any health related questions or concerns.   Tomasa Blase, PharmD Clinical Pharmacist, [place]

## 2020-12-20 NOTE — Progress Notes (Signed)
Chronic Care Management Pharmacy Note  12/20/2020 Name:  [name] MRN:  [id number] DOB:  [birth year]  Summary: - reports that she feels she has been doing much better since ED visit for kidney stone 11/10/2020 - reports that she feels it has passed.  Still has pain on her side but feels that it is due to her hernia rather than kidney stone previously  -continues to have neuropathy in both feet, taking gabapentin 673m - 3 tablets daily  -reports that blood sugars have been averaging 95-134, neuropathy will increase should blood sugars be elevated -Continues to try to reduce cigarette use(has not bought a pack of cigarettes in 3 months), currently vaping - 1 pack lasts about 1 month to a little bit longer than a month, once she is able to cease smoking will be eligible for hernia repair   Recommendations/Changes made from today's visit: -Recommending for patient to continue to work towards smoking cessation, patient was not interested in NRT or medications to aid in smoking cessation - is aware to 1-800-QUIT-NOW that she can call -Recommending for BMP to ordered to recheck kidney function, likely kidney function was decreased in ED due to kidney stone, will recheck and adjust gabapentin dosing should CrCl remain <527mmin    Subjective: [name] is an [age] year old female who is a primary patient of Burns, StClaudina LickMD.  The CCM team was consulted for assistance with disease management and care coordination needs.    Engaged with patient by telephone for follow up visit in response to provider referral for pharmacy case management and/or care coordination services.   Consent to Services:  The patient was given the following information about Chronic Care Management services today, agreed to services, and gave verbal consent: 1. CCM service includes personalized support from designated clinical staff supervised by the primary care provider, including individualized  plan of care and coordination with other care providers 2. 24/7 contact phone numbers for assistance for urgent and routine care needs. 3. Service will only be billed when office clinical staff spend 20 minutes or more in a month to coordinate care. 4. Only one practitioner may furnish and bill the service in a calendar month. 5.The patient may stop CCM services at any time (effective at the end of the month) by phone call to the office staff. 6. The patient will be responsible for cost sharing (co-pay) of up to 20% of the service fee (after annual deductible is met). Patient agreed to services and consent obtained.  Patient Care Team: BuBinnie RailMD as PCP - General (Internal Medicine) AmYisroel RammingBrEverardo AllMD (Obstetrics and Gynecology) ZeJana HalfDPM (Podiatry) GoHeath LarkMD as Consulting Physician (Hematology and Oncology) ThLeighton RuffMD (General Surgery) Armbruster, StCarlota RaspberryMD as Consulting Physician (Gastroenterology) [facility]a Bl[facility] Pharmacist (Pharmacist)  Recent office visits: 10/03/2020 - Dr. BuQuay Burow f/u - referred to podiatry for neuropathy in feet   Recent consult visits: 10/17/2020 - Dr. EgValentina Lucks Podiatry - diabetic shoes ordered, nails debrided on each foot   Hospital visits: 11/10/2020 - ED visit for kidney stone, given oral pain medications, discharged on percocet 5/32559m 1 tablet every 6 hours as needed and zofran 4mg4m1 tablet every 8 hours as needed   Objective:  Lab Results  Component Value Date   CREATININE 1.43 (H) 11/10/2020   BUN 48 (H) 11/10/2020   GFR 48.98 (L) 06/13/2020   GFRNONAA 41 (L) 11/10/2020  GFRAA >60 01/20/2015   NA 137 11/10/2020   K 4.9 11/10/2020   CALCIUM 9.8 11/10/2020   CO2 18 (L) 11/10/2020   GLUCOSE 124 (H) 11/10/2020    Lab Results  Component Value Date/Time   HGBA1C 6.8 (H) 06/13/2020 08:16 AM   HGBA1C 6.5 (A) 11/21/2019 10:07 AM   HGBA1C 7.8 (H) 06/13/2019 08:22 AM   GFR 48.98 (L) 06/13/2020  08:16 AM   GFR 73.68 06/13/2019 08:22 AM   MICROALBUR 11.0 (H) 12/01/2016 08:47 AM    Last diabetic Eye exam:  No results found for: HMDIABEYEEXA  Last diabetic Foot exam:  No results found for: HMDIABFOOTEX   Lab Results  Component Value Date   CHOL 148 06/13/2020   HDL 39.00 (L) 06/13/2020   LDLCALC 80 06/13/2020   LDLDIRECT 51.0 12/13/2018   TRIG 145.0 06/13/2020   CHOLHDL 4 06/13/2020    Hepatic Function Latest Ref Rng & Units 11/10/2020 06/13/2020 06/13/2019  Total Protein 6.5 - 8.1 g/dL 7.6 7.1 7.3  Albumin 3.5 - 5.0 g/dL 4.4 4.2 4.2  AST 15 - 41 U/L _0 ALT 0 - 44 U/L _1 Alk Phosphatase 38 - 126 U/L 57 62 71  Total Bilirubin 0.3 - 1.2 mg/dL 0.6 0.4 0.5  Bilirubin, Direct 0.0 - 0.3 mg/dL - - -    Lab Results  Component Value Date/Time   TSH 0.82 10/09/2015 04:45 PM   TSH 0.87 05/23/2014 09:27 AM    CBC Latest Ref Rng & Units 11/10/2020 06/13/2020 06/13/2019  WBC 4.0 - 10.5 K/uL 9.5 8.2 8.4  Hemoglobin 12.0 - 15.0 g/dL 12.7 13.5 12.7  Hematocrit 36.0 - 46.0 % 39.6 41.0 38.7  Platelets 150 - 400 K/uL 351 341.0 322.0    No results found for: VD25OH  Clinical ASCVD: No  The 10-year ASCVD risk score (Arnett DK, et al., 2019) is: 14.1%   Values used to calculate the score:     Age: 65 years     Sex: Female     Is Non-Hispanic African American: Yes     Diabetic: Yes     Tobacco smoker: Yes     Systolic Blood Pressure: 91 mmHg     Is BP treated: Yes     HDL Cholesterol: 39 mg/dL     Total Cholesterol: 148 mg/dL    Depression screen [facility] 2/9 10/03/2020 01/02/2020 11/21/2019  Decreased Interest 0 0 0  Down, Depressed, Hopeless 0 1 0  PHQ - 2 Score 0 1 0  Altered sleeping - - -  Tired, decreased energy - - -  Change in appetite - - -  Feeling bad or failure about yourself  - - -  Trouble concentrating - - -  Moving slowly or fidgety/restless - - -  Suicidal thoughts - - -  PHQ-9 Score - - -  Difficult doing work/chores - - -  Some recent data might be  hidden     Social History   Tobacco Use  Smoking Status Every Day   Packs/day: 0.25   Years: 40.00   Pack years: 10.00   Types: Cigarettes  Smokeless Tobacco Never  Tobacco Comments   Pt. in project free program, motivated to stop smoking completely. Declined smoking cessation cone program.   BP Readings from Last 3 Encounters:  11/10/20 (!) 91/55  10/03/20 122/68  06/13/20 130/78   Pulse Readings from Last 3 Encounters:  11/10/20 65  10/03/20 73  06/13/20 60   Wt Readings  from Last 3 Encounters:  11/10/20 175 lb (79.4 kg)  10/03/20 169 lb (76.7 kg)  06/13/20 175 lb (79.4 kg)   BMI Readings from Last 3 Encounters:  11/10/20 28.25 kg/m  10/03/20 27.28 kg/m  06/13/20 28.25 kg/m    Assessment/Interventions: Review of patient past medical history, allergies, medications, health status, including review of consultants reports, laboratory and other test data, was performed as part of comprehensive evaluation and provision of chronic care management services.   SDOH:  (Social Determinants of Health) assessments and interventions performed: Yes  SDOH Screenings   Alcohol Screen: Medium Risk   Last Alcohol Screening Score (AUDIT): 10  Depression (PHQ2-9): Low Risk    PHQ-2 Score: 0  Financial Resource Strain: Low Risk    Difficulty of Paying Living Expenses: Not hard at all  Food Insecurity: No Food Insecurity   Worried About Charity fundraiser in the Last Year: Never true   Ran Out of Food in the Last Year: Never true  Housing: [place] Risk Score: 0  Physical Activity: Sufficiently Active   Days of Exercise per Week: 5 days   Minutes of Exercise per Session: 30 min  Social Connections: Unknown   Frequency of Communication with Friends and Family: More than three times a week   Frequency of Social Gatherings with Friends and Family: More than three times a week   Attends Religious Services: Patient refused   Marine scientist or  Organizations: Patient refused   Attends Music therapist: Patient refused   Marital Status: Living with partner  Stress: Stress Concern Present   Feeling of Stress : Rather much  Tobacco Use: High Risk   Smoking Tobacco Use: Every Day   Smokeless Tobacco Use: Never  Transportation Needs: No Transportation Needs   Lack of Transportation (Medical): No   Lack of Transportation (Non-Medical): No    CCM Care Plan  Allergies  Allergen Reactions   Ampicillin Hives   Hydrocodone Itching    Can take Oxycodone and Codeine   Penicillins Other (See Comments)    Unknown reaction   Grapeseed Extract [Nutritional Supplements] Rash    Medications Reviewed Today     Reviewed by Tomasa Blase, [facility] (Pharmacist) on 12/20/20 at [place] List Status: <None>   Medication Order Taking? Sig Documenting Provider Last Dose Status Informant  ALPRAZolam (XANAX) 0.5 MG tablet [id number] Yes TAKE 1 TABLET BY MOUTH  TWICE DAILY AS NEEDED FOR  ANXIETY Janith Lima, MD Taking Active   Blood Glucose Monitoring Suppl (ACCU-CHEK GUIDE) w/Device KIT [id number] Yes USE TO CHECK BLOOD SUGAR  ONCE DAILY AND AS NEEDED Binnie Rail, MD Taking Active   colchicine 0.6 MG tablet [id number] Yes TAKE 2 TABLETS BY MOUTH FOR ONE DOSE AND THEN 1 TABLET  BY MOUTH 1 HOUR LATER FOR  GOUT FLARE Binnie Rail, MD Taking Active   fenofibrate (TRICOR) 145 MG tablet [id number] Yes TAKE 1 TABLET BY MOUTH ONCE DAILY Burns, Claudina Lick, MD Taking Active   gabapentin (NEURONTIN) 600 MG tablet [id number] Yes TAKE 1 TABLET BY MOUTH 3  TIMES DAILY Burns, Claudina Lick, MD Taking Active   glucose blood (FREESTYLE LITE) test strip [id number] Yes Use as instructed Binnie Rail, MD Taking Active   hydrochlorothiazide (HYDRODIURIL) 25 MG tablet [id number] Yes TAKE 1 TABLET BY MOUTH  DAILY Burns, Claudina Lick, MD Taking Active   Lancets [facility] ULTRASOFT) lancets [id number] Yes Use as directed to  check sugars once daily and as needed.  Dx E11.43  Binnie Rail, MD Taking Active   lisinopril (ZESTRIL) 10 MG tablet [id number] Yes TAKE 1 TABLET BY MOUTH  DAILY Burns, Claudina Lick, MD Taking Active   metFORMIN (GLUCOPHAGE) 500 MG tablet [id number] Yes TAKE 1 TABLET BY MOUTH  TWICE DAILY WITH MEALS Burns, Claudina Lick, MD Taking Active   rosuvastatin (CRESTOR) 5 MG tablet [id number] Yes TAKE 1 TABLET BY MOUTH  TWICE WEEKLY Binnie Rail, MD Taking Active             Patient Active Problem List   Diagnosis Date Noted   History of gout 02/15/2020   Alcohol abuse    Hypertriglyceridemia 06/13/2019   Aortic atherosclerosis ([place]) 06/13/2019   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   B12 deficiency 06/15/2016   Anxiety 06/12/2016   Type 2 diabetes mellitus with diabetic autonomic neuropathy, without long-term current use of insulin ([place]) 01/10/2016   Peristomal hernia 04/19/2012   Seborrheic dermatitis 08/09/2010   PERSONAL HX RECTAL CANCER 06/05/2010   Tobacco dependence due to cigarettes 04/16/2010   ALLERGIC RHINITIS 04/16/2010   Peripheral neuropathy ([place]) 12/06/2009   Essential hypertension 12/06/2009   Diarrhea 12/06/2009    Immunization History  Administered Date(s) Administered   Influenza Whole 01/02/2020   Influenza,inj,Quad PF,6+ Mos 03/18/2013, 05/23/2014, 01/10/2016, 04/03/2017, 12/09/2017, 12/13/2018   PFIZER(Purple Top)SARS-COV-2 Vaccination 07/07/2019, 07/28/2019   Pneumococcal Conjugate-13 05/23/2014   Pneumococcal Polysaccharide-23 06/12/2016   Td 05/01/2010    Conditions to be addressed/monitored:  Hypertension, Hyperlipidemia, Diabetes, Anxiety, Gout and peripheral neuropathy   Care Plan : CCM Care Plan  Updates made by Tomasa Blase, RPH since 12/20/2020 12:00 AM     Problem: HTN, HLD, DM2, Gout, Anxiety, Neuropathy, Smoking Cessation   Priority: High     Long-Range Goal: Disease Management   Start Date: 09/20/2020  Expected End Date: 03/22/2021  This Visit's Progress: On track  Recent Progress: On track   Priority: High  Note:   Current Barriers:  Unable to independently monitor therapeutic efficacy  Pharmacist Clinical Goal(s):  Patient will achieve adherence to monitoring guidelines and medication adherence to achieve therapeutic efficacy maintain control of BP, Blood sugars, and LDL as evidenced by BP and blood sugar logs / next lipid panel  Smoking cessation through collaboration with PharmD and provider.   Interventions: 1:1 collaboration with Binnie Rail, MD regarding development and update of comprehensive plan of care as evidenced by provider attestation and co-signature Inter-disciplinary care team collaboration (see longitudinal plan of care) Comprehensive medication review performed; medication list updated in electronic medical record  Hypertension (BP goal <130/80) -Controlled -Current treatment: Hydrochlorothiazide 63m daily  Lisinopril 144mdaily  -Medications previously tried: amlodipine  -Current home readings: 131/75, 130/75 BP Readings from Last 3 Encounters:  11/10/20 (!) 91/55  10/03/20 122/68  06/13/20 130/78  -Current dietary habits: eats a sodium reduced diet -Current exercise habits: limited  -Denies hypotensive/hypertensive symptoms -Educated on BP goals and benefits of medications for prevention of heart attack, stroke and kidney damage; Daily salt intake goal < 2300 mg; Importance of home blood pressure monitoring; Symptoms of hypotension and importance of maintaining adequate hydration; -Counseled to monitor BP at home daily as she has been doing, document, and provide log at future appointments -Counseled on diet and exercise extensively Recommended to continue current medication  Hyperlipidemia: (LDL goal < 70) -Controlled  Lab Results  Component Value Date   LDLCALC 80 06/13/2020  -Current treatment: Rosuvastatin 63m57m-  taking twice weekly  Fenofibrate 172m daily  -Medications previously tried: n/a  -Current dietary patterns: avoids  fried fatty foods, has reduced her red meat intake, trying to watch diet closer than she previously had been to reduce LDL and blood sugars -Current exercise habits: limited  -Educated on Cholesterol goals;  Benefits of statin for ASCVD risk reduction; Importance of limiting foods high in cholesterol; -Counseled on diet and exercise extensively Recommended to continue current medication  Diabetes (A1c goal <7%) -Controlled  Lab Results  Component Value Date   HGBA1C 6.8 (H) 06/13/2020  -Last GFR 48.98 mL/min  (06/13/2020) -Current medications: Metformin 5038mtwice daily  -Medications previously tried: checking 2-3 times weekly   -Current home glucose readings fasting glucose: 95-134 -Reports hypoglycemic/hyperglycemic symptoms -Current meal patterns:  breakfast: egg whites +/- toast, grits - at times does not eat   lunch: may not eat / may be her breakfast   dinner: homemade, meat, vegetable, and a small amount of starch/ carb snacks: chips drinks: water, on rare occasion Kool-Aid, coffee w/ creamer  -Current exercise: limited  -Educated on A1c and blood sugar goals; Complications of diabetes including kidney damage, retinal damage, and cardiovascular disease; Prevention and management of hypoglycemic episodes; Benefits of routine self-monitoring of blood sugar; -Counseled to check feet daily and get yearly eye exams -Counseled on diet and exercise extensively Recommended to continue current medication  Anxiety (Goal: Prevention of Anxiety Attack / Mood Control ) -Not ideally controlled per patient better when taking alprazolam -Current treatment: Alprazolam 0.62m38mwice daily as needed for anxiety (reports that she has been taking since 2012) -Medications previously tried/failed: wellbutrin, citalopram, duloxetine, elavil  -GAD7: 13 - patient notes that she feels her screening is a major improvement since starting medication -Educated on Benefits of medication for symptom  control Benefits of cognitive-behavioral therapy with or without medication -Recommended to continue current medication Educated on using alprazolam no more than as prescribed, advised patient to use only as needed and to not take on a scheduled basis   Gout (Goal: Prevention of gout attacks / treatment of acute attacks ) -Controlled -Current treatment  Colchicine 0.6mg63m2 tablets x 1 dose then 1 tablet 1 hour later if needed  -Medications previously tried: n/a  -Recommended to continue current medication Counseled on diet, avoiding foods that can exacerbate / lead to acute flares    Peripheral Neuropathy (Goal: Pain control ) -Not ideally controlled  - Last estimated CrCl = 49.47 mL/min - likely due to kidney stone  - would recommend recheck and decrease dosage to 900mg79mly should CrCl remain <50mL/73m -Current treatment  Gabapentin 600mg -29mablets daily   -Medications previously tried: duloxetine, lyrica  -Recommended for patient to continue current medication at this time  Tobacco use (Goal Smoking Cessation) -Not ideally controlled -Previous quit attempts: Had stopped smoking for 13 days previously while admitted after surgery also had quit 4-6 months prior to COVID - restarted due to stress from COVID  -Current tobacco usage  1 pack can last her about >1 month- depending on stress levels can go 2-3 days at a time without smoking a cigarette - reports that she has not bought a pack of cigarettes for 3 months Notes to infrequent vape usage  -Patient smokes After 30 minutes of waking -Patient triggers include: stress and anxiety, finishing a meal, and going to a social event and seeing someone else smoke -On a scale of 1-10, reports MOTIVATION to quit is 6-7 -On a  scale of 1-10, reports CONFIDENCE in quitting is 8-9 -Provided contact information for [place] Quit Line (1-800-QUIT-NOW) and encouraged patient to reach out to this group for support.  -Recommended for patient to  establish a quit date before next appointment (3 months)   Patient Goals/Self-Care Activities Patient will:  - take medications as prescribed check glucose daily, document, and provide at future appointments check blood pressure daily, document, and provide at future appointments Establish smoking quit date before next appointment   Follow Up Plan: Telephone follow up appointment with care management team member scheduled for: 1 week The patient has been provided with contact information for the care management team and has been advised to call with any health related questions or concerns.         Medication Assistance: None required.  Patient affirms current coverage meets needs.  Patient's preferred pharmacy is:  [facility] ([place]), [place] - [address] [place] ([place]) [place] [address] Phone: [phone] Fax: [phone]  [facility]  ([place]) - [place], [place] [facility] [address] [place] [address] Phone: [phone] Fax: [phone]   Uses pill box? No - reports that she turns bottle over after she takes in the morning, turns over at the end of the day  Pt endorses 100% compliance  Care Plan and Follow Up Patient Decision:  Patient agrees to Care Plan and Follow-up.  Plan: Telephone follow up appointment with care management team member scheduled for:  3 months  and The patient has been provided with contact information for the care management team and has been advised to call with any health related questions or concerns.   Tomasa Blase, PharmD Clinical Pharmacist, [place]

## 2020-12-25 ENCOUNTER — Other Ambulatory Visit: Payer: Medicare Other

## 2020-12-26 ENCOUNTER — Other Ambulatory Visit: Payer: Self-pay

## 2020-12-26 ENCOUNTER — Telehealth: Payer: Medicare Other

## 2020-12-26 ENCOUNTER — Other Ambulatory Visit (INDEPENDENT_AMBULATORY_CARE_PROVIDER_SITE_OTHER): Payer: Medicare Other

## 2020-12-26 DIAGNOSIS — I10 Essential (primary) hypertension: Secondary | ICD-10-CM | POA: Diagnosis not present

## 2020-12-26 LAB — BASIC METABOLIC PANEL
BUN: 23 mg/dL (ref 6–23)
CO2: 23 mEq/L (ref 19–32)
Calcium: 10.2 mg/dL (ref 8.4–10.5)
Chloride: 107 mEq/L (ref 96–112)
Creatinine, Ser: 1.24 mg/dL — ABNORMAL HIGH (ref 0.40–1.20)
GFR: 45.98 mL/min — ABNORMAL LOW (ref >60.00)
Glucose, Bld: 98 mg/dL (ref 70–99)
Potassium: 3.7 mEq/L (ref 3.5–5.1)
Sodium: 141 mEq/L (ref 135–145)

## 2020-12-27 ENCOUNTER — Ambulatory Visit: Payer: Medicare Other

## 2020-12-27 NOTE — Progress Notes (Signed)
Chronic Care Management Pharmacy Note  12/27/2020 Name:  [name] MRN:  [id number] DOB:  [birth year]  Summary: - patient completed repeat BMP yesterday, Scr remains slightly elevated but improved at 1.24 mg/dL, eGFR remains decreased at 45.110m/min, but CrCl has improved to 57 mL/min -Patient reports that she is doing well, continuing to work toward smoking cessation  Recommendations/Changes made from today's visit: -as BMP shows that CrCl has increased and is now >561mmin - recommending for patient to return to gabapentin 60015m times daily, patient voiced understanding of plan, will continue to remain well hydrated throughout the day -Patient agreeable to continue to work toward smoking cessation, trying to have a quit date in place with next visit   Subjective: [name] is an [age]o. year old female who is a primary patient of Burns, StaClaudina LickD.  The CCM team was consulted for assistance with disease management and care coordination needs.    Engaged with patient by telephone for follow up visit in response to provider referral for pharmacy case management and/or care coordination services.   Consent to Services:  The patient was given the following information about Chronic Care Management services today, agreed to services, and gave verbal consent: 1. CCM service includes personalized support from designated clinical staff supervised by the primary care provider, including individualized plan of care and coordination with other care providers 2. 24/7 contact phone numbers for assistance for urgent and routine care needs. 3. Service will only be billed when office clinical staff spend 20 minutes or more in a month to coordinate care. 4. Only one practitioner may furnish and bill the service in a calendar month. 5.The patient may stop CCM services at any time (effective at the end of the month) by phone call to the office staff. 6. The patient will be  responsible for cost sharing (co-pay) of up to 20% of the service fee (after annual deductible is met). Patient agreed to services and consent obtained.  Patient Care Team: BurBinnie RailD as PCP - General (Internal Medicine) AmuYisroel RammingroEverardo AllD (Obstetrics and Gynecology) ZeiJana HalfPM (Podiatry) GorHeath LarkD as Consulting Physician (Hematology and Oncology) ThoLeighton RuffD (General Surgery) Armbruster, SteCarlota RaspberryD as Consulting Physician (Gastroenterology) SzaDelice BisonnDarnelle MaffucciPHLexington [facility] Pharmacist (Pharmacist)  Recent office visits: 10/03/2020 - Dr. BurQuay Burowf/u - referred to podiatry for neuropathy in feet   Recent consult visits: 10/17/2020 - Dr. EgeValentina LucksPodiatry - diabetic shoes ordered, nails debrided on each foot   Hospital visits: 11/10/2020 - ED visit for kidney stone, given oral pain medications, discharged on percocet 5/325m45m1 tablet every 6 hours as needed and zofran 4mg 58m tablet every 8 hours as needed   Objective:  Lab Results  Component Value Date   CREATININE 1.24 (H) 12/26/2020   BUN 23 12/26/2020   GFR 45.98 (L) 12/26/2020   GFRNONAA 41 (L) 11/10/2020   GFRAA >60 01/20/2015   NA 141 12/26/2020   K 3.7 12/26/2020   CALCIUM 10.2 12/26/2020   CO2 23 12/26/2020   GLUCOSE 98 12/26/2020    Lab Results  Component Value Date/Time   HGBA1C 6.8 (H) 06/13/2020 08:16 AM   HGBA1C 6.5 (A) 11/21/2019 10:07 AM   HGBA1C 7.8 (H) 06/13/2019 08:22 AM   GFR 45.98 (L) 12/26/2020 12:23 PM   GFR 48.98 (L) 06/13/2020 08:16 AM   MICROALBUR 11.0 (H) 12/01/2016 08:47 AM    Last diabetic Eye exam:  No results found for: HMDIABEYEEXA  Last diabetic Foot exam:  No results found for: HMDIABFOOTEX   Lab Results  Component Value Date   CHOL 148 06/13/2020   HDL 39.00 (L) 06/13/2020   LDLCALC 80 06/13/2020   LDLDIRECT 51.0 12/13/2018   TRIG 145.0 06/13/2020   CHOLHDL 4 06/13/2020    Hepatic Function Latest Ref Rng & Units 11/10/2020 06/13/2020 06/13/2019   Total Protein 6.5 - 8.1 g/dL 7.6 7.1 7.3  Albumin 3.5 - 5.0 g/dL 4.4 4.2 4.2  AST 15 - 41 U/L '26 17 17  ' ALT 0 - 44 U/L '19 11 12  ' Alk Phosphatase 38 - 126 U/L 57 62 71  Total Bilirubin 0.3 - 1.2 mg/dL 0.6 0.4 0.5  Bilirubin, Direct 0.0 - 0.3 mg/dL - - -    Lab Results  Component Value Date/Time   TSH 0.82 10/09/2015 04:45 PM   TSH 0.87 05/23/2014 09:27 AM    CBC Latest Ref Rng & Units 11/10/2020 06/13/2020 06/13/2019  WBC 4.0 - 10.5 K/uL 9.5 8.2 8.4  Hemoglobin 12.0 - 15.0 g/dL 12.7 13.5 12.7  Hematocrit 36.0 - 46.0 % 39.6 41.0 38.7  Platelets 150 - 400 K/uL 351 341.0 322.0    No results found for: VD25OH  Clinical ASCVD: No  The 10-year ASCVD risk score (Arnett DK, et al., 2019) is: 14.1%   Values used to calculate the score:     Age: 30 years     Sex: Female     Is Non-Hispanic African American: Yes     Diabetic: Yes     Tobacco smoker: Yes     Systolic Blood Pressure: 91 mmHg     Is BP treated: Yes     HDL Cholesterol: 39 mg/dL     Total Cholesterol: 148 mg/dL    Depression screen [facility] 2/9 10/03/2020 01/02/2020 11/21/2019  Decreased Interest 0 0 0  Down, Depressed, Hopeless 0 1 0  PHQ - 2 Score 0 1 0  Altered sleeping - - -  Tired, decreased energy - - -  Change in appetite - - -  Feeling bad or failure about yourself  - - -  Trouble concentrating - - -  Moving slowly or fidgety/restless - - -  Suicidal thoughts - - -  PHQ-9 Score - - -  Difficult doing work/chores - - -  Some recent data might be hidden     Social History   Tobacco Use  Smoking Status Every Day   Packs/day: 0.25   Years: 40.00   Pack years: 10.00   Types: Cigarettes  Smokeless Tobacco Never  Tobacco Comments   Pt. in project free program, motivated to stop smoking completely. Declined smoking cessation cone program.   BP Readings from Last 3 Encounters:  11/10/20 (!) 91/55  10/03/20 122/68  06/13/20 130/78   Pulse Readings from Last 3 Encounters:  11/10/20 65  10/03/20 73  06/13/20  60   Wt Readings from Last 3 Encounters:  11/10/20 175 lb (79.4 kg)  10/03/20 169 lb (76.7 kg)  06/13/20 175 lb (79.4 kg)   BMI Readings from Last 3 Encounters:  11/10/20 28.25 kg/m  10/03/20 27.28 kg/m  06/13/20 28.25 kg/m    Assessment/Interventions: Review of patient past medical history, allergies, medications, health status, including review of consultants reports, laboratory and other test data, was performed as part of comprehensive evaluation and provision of chronic care management services.   SDOH:  (Social Determinants of Health) assessments and interventions performed: Yes  SDOH Screenings   Alcohol Screen: Medium Risk   Last Alcohol Screening Score (AUDIT): 10  Depression (PHQ2-9): Low Risk    PHQ-2 Score: 0  Financial Resource Strain: Low Risk    Difficulty of Paying Living Expenses: Not hard at all  Food Insecurity: No Food Insecurity   Worried About Charity fundraiser in the Last Year: Never true   Ran Out of Food in the Last Year: Never true  Housing: [place] Risk Score: 0  Physical Activity: Sufficiently Active   Days of Exercise per Week: 5 days   Minutes of Exercise per Session: 30 min  Social Connections: Unknown   Frequency of Communication with Friends and Family: More than three times a week   Frequency of Social Gatherings with Friends and Family: More than three times a week   Attends Religious Services: Patient refused   Marine scientist or Organizations: Patient refused   Attends Music therapist: Patient refused   Marital Status: Living with partner  Stress: Stress Concern Present   Feeling of Stress : Rather much  Tobacco Use: High Risk   Smoking Tobacco Use: Every Day   Smokeless Tobacco Use: Never  Transportation Needs: No Transportation Needs   Lack of Transportation (Medical): No   Lack of Transportation (Non-Medical): No    CCM Care Plan  Allergies  Allergen Reactions   Ampicillin Hives    Hydrocodone Itching    Can take Oxycodone and Codeine   Penicillins Other (See Comments)    Unknown reaction   Grapeseed Extract [Nutritional Supplements] Rash    Medications Reviewed Today     Reviewed by Tomasa Blase, [facility] (Pharmacist) on 12/27/20 at 1128  Med List Status: <None>   Medication Order Taking? Sig Documenting Provider Last Dose Status Informant  ALPRAZolam (XANAX) 0.5 MG tablet [id number] No TAKE 1 TABLET BY MOUTH  TWICE DAILY AS NEEDED FOR  ANXIETY Janith Lima, MD Taking Active   Blood Glucose Monitoring Suppl (ACCU-CHEK GUIDE) w/Device KIT [id number] No USE TO CHECK BLOOD SUGAR  ONCE DAILY AND AS NEEDED Binnie Rail, MD Taking Active   colchicine 0.6 MG tablet [id number] No TAKE 2 TABLETS BY MOUTH FOR ONE DOSE AND THEN 1 TABLET  BY MOUTH 1 HOUR LATER FOR  GOUT FLARE Binnie Rail, MD Taking Active   fenofibrate (TRICOR) 145 MG tablet [id number] No TAKE 1 TABLET BY MOUTH ONCE DAILY Burns, Claudina Lick, MD Taking Active   gabapentin (NEURONTIN) 600 MG tablet [id number] No TAKE 1 TABLET BY MOUTH 3  TIMES DAILY Burns, Claudina Lick, MD Taking Active   glucose blood (FREESTYLE LITE) test strip [id number] No Use as instructed Binnie Rail, MD Taking Active   hydrochlorothiazide (HYDRODIURIL) 25 MG tablet [id number] No TAKE 1 TABLET BY MOUTH  DAILY Burns, Claudina Lick, MD Taking Active   Lancets [facility] ULTRASOFT) lancets [id number] No Use as directed to check sugars once daily and as needed.  Dx F81.82 Binnie Rail, MD Taking Active   lisinopril (ZESTRIL) 10 MG tablet [id number] No TAKE 1 TABLET BY MOUTH  DAILY Burns, Claudina Lick, MD Taking Active   metFORMIN (GLUCOPHAGE) 500 MG tablet [id number] No TAKE 1 TABLET BY MOUTH  TWICE DAILY WITH MEALS Burns, Claudina Lick, MD Taking Active   rosuvastatin (CRESTOR) 5 MG tablet [id number] No TAKE 1 TABLET BY MOUTH  TWICE WEEKLY Burns, Claudina Lick, MD Taking Active  Patient Active Problem List   Diagnosis Date Noted   History of gout 02/15/2020    Alcohol abuse    Hypertriglyceridemia 06/13/2019   Aortic atherosclerosis ([place]) 06/13/2019   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[facility]) 12/13/2018   B12 deficiency 06/15/2016   Anxiety 06/12/2016   Type 2 diabetes mellitus with diabetic autonomic neuropathy, without long-term current use of insulin ([place]) 01/10/2016   Peristomal hernia 04/19/2012   Seborrheic dermatitis 08/09/2010   PERSONAL HX RECTAL CANCER 06/05/2010   Tobacco dependence due to cigarettes 04/16/2010   ALLERGIC RHINITIS 04/16/2010   Peripheral neuropathy ([place]) 12/06/2009   Essential hypertension 12/06/2009   Diarrhea 12/06/2009    Immunization History  Administered Date(s) Administered   Influenza Whole 01/02/2020   Influenza,inj,Quad PF,6+ Mos 03/18/2013, 05/23/2014, 01/10/2016, 04/03/2017, 12/09/2017, 12/13/2018   PFIZER(Purple Top)SARS-COV-2 Vaccination 07/07/2019, 07/28/2019   Pneumococcal Conjugate-13 05/23/2014   Pneumococcal Polysaccharide-23 06/12/2016   Td 05/01/2010    Conditions to be addressed/monitored:  Hypertension, Hyperlipidemia, Diabetes, Anxiety, Gout and peripheral neuropathy   Care Plan : CCM Care Plan  Updates made by Tomasa Blase, [place] since 12/27/2020 12:00 AM     Problem: HTN, HLD, DM2, Gout, Anxiety, Neuropathy, Smoking Cessation   Priority: High     Long-Range Goal: Disease Management   Start Date: 09/20/2020  Expected End Date: 03/22/2021  This Visit's Progress: On track  Recent Progress: On track  Priority: High  Note:   Current Barriers:  Unable to independently monitor therapeutic efficacy  Pharmacist Clinical Goal(s):  Patient will achieve adherence to monitoring guidelines and medication adherence to achieve therapeutic efficacy maintain control of BP, Blood sugars, and LDL as evidenced by BP and blood sugar logs / next lipid panel  Smoking cessation through collaboration with PharmD and provider.   Interventions: 1:1 collaboration with Binnie Rail, MD regarding development  and update of comprehensive plan of care as evidenced by provider attestation and co-signature Inter-disciplinary care team collaboration (see longitudinal plan of care) Comprehensive medication review performed; medication list updated in electronic medical record  Hypertension (BP goal <130/80) -Controlled -Current treatment: Hydrochlorothiazide 27m daily  Lisinopril 176mdaily  -Medications previously tried: amlodipine  -Current home readings: 131/75, 130/75 BP Readings from Last 3 Encounters:  11/10/20 (!) 91/55  10/03/20 122/68  06/13/20 130/78  -Current dietary habits: eats a sodium reduced diet -Current exercise habits: limited  -Denies hypotensive/hypertensive symptoms -Educated on BP goals and benefits of medications for prevention of heart attack, stroke and kidney damage; Daily salt intake goal < 2300 mg; Importance of home blood pressure monitoring; Symptoms of hypotension and importance of maintaining adequate hydration; -Counseled to monitor BP at home daily as she has been doing, document, and provide log at future appointments -Counseled on diet and exercise extensively Recommended to continue current medication  Hyperlipidemia: (LDL goal < 70) -Controlled  Lab Results  Component Value Date   LDLCALC 80 06/13/2020  -Current treatment: Rosuvastatin 59m1m- taking twice weekly  Fenofibrate 1459m26mily  -Medications previously tried: n/a  -Current dietary patterns: avoids fried fatty foods, has reduced her red meat intake, trying to watch diet closer than she previously had been to reduce LDL and blood sugars -Current exercise habits: limited  -Educated on Cholesterol goals;  Benefits of statin for ASCVD risk reduction; Importance of limiting foods high in cholesterol; -Counseled on diet and exercise extensively Recommended to continue current medication  Diabetes (A1c goal <7%) -Controlled  Lab Results  Component Value Date   HGBA1C 6.8 (H) 06/13/2020   -  Last GFR 48.98 mL/min  (06/13/2020) -Current medications: Metformin 577m twice daily  -Medications previously tried: checking 2-3 times weekly   -Current home glucose readings fasting glucose: 95-134 -Reports hypoglycemic/hyperglycemic symptoms -Current meal patterns:  breakfast: egg whites +/- toast, grits - at times does not eat   lunch: may not eat / may be her breakfast   dinner: homemade, meat, vegetable, and a small amount of starch/ carb snacks: chips drinks: water, on rare occasion Kool-Aid, coffee w/ creamer  -Current exercise: limited  -Educated on A1c and blood sugar goals; Complications of diabetes including kidney damage, retinal damage, and cardiovascular disease; Prevention and management of hypoglycemic episodes; Benefits of routine self-monitoring of blood sugar; -Counseled to check feet daily and get yearly eye exams -Counseled on diet and exercise extensively Recommended to continue current medication  Anxiety (Goal: Prevention of Anxiety Attack / Mood Control ) -Not ideally controlled per patient better when taking alprazolam -Current treatment: Alprazolam 0.523mtwice daily as needed for anxiety (reports that she has been taking since 2012) -Medications previously tried/failed: wellbutrin, citalopram, duloxetine, elavil  -GAD7: 13 - patient notes that she feels her screening is a major improvement since starting medication -Educated on Benefits of medication for symptom control Benefits of cognitive-behavioral therapy with or without medication -Recommended to continue current medication Educated on using alprazolam no more than as prescribed, advised patient to use only as needed and to not take on a scheduled basis   Gout (Goal: Prevention of gout attacks / treatment of acute attacks ) -Controlled -Current treatment  Colchicine 0.61m24m 2 tablets x 1 dose then 1 tablet 1 hour later if needed  -Medications previously tried: n/a  -Recommended to continue  current medication Counseled on diet, avoiding foods that can exacerbate / lead to acute flares    Peripheral Neuropathy (Goal: Pain control ) -Not ideally controlled  - Last estimated CrCl =  57.035 mL/min  -Current treatment  Gabapentin 600m28m3 tablets daily   -Medications previously tried: duloxetine, lyrica  -Recommended for patient to continue current medication at this time  Tobacco use (Goal Smoking Cessation) -Not ideally controlled -Previous quit attempts: Had stopped smoking for 13 days previously while admitted after surgery also had quit 4-6 months prior to COVID - restarted due to stress from COVID  -Current tobacco usage  1 pack can last her about >1 month- depending on stress levels can go 2-3 days at a time without smoking a cigarette - reports that she has not bought a pack of cigarettes for 3 months Notes to infrequent vape usage  -Patient smokes After 30 minutes of waking -Patient triggers include: stress and anxiety, finishing a meal, and going to a social event and seeing someone else smoke -On a scale of 1-10, reports MOTIVATION to quit is 6-7 -On a scale of 1-10, reports CONFIDENCE in quitting is 8-9 -Provided contact information for [place] [facility]uit Line (1-800-QUIT-NOW) and encouraged patient to reach out to this group for support.  -Recommended for patient to establish a quit date before next appointment (3 months)   Patient Goals/Self-Care Activities Patient will:  - take medications as prescribed check glucose daily, document, and provide at future appointments check blood pressure daily, document, and provide at future appointments Establish smoking quit date before next appointment   Follow Up Plan: Telephone follow up appointment with care management team member scheduled for: 3 months The patient has been provided with contact information for the care management team and has been advised to call  with any health related questions or concerns.           Medication Assistance: None required.  Patient affirms current coverage meets needs.  Patient's preferred pharmacy is:  [facility] ([place]), [place] - [address] [place] ([place]) [place] [address] Phone: [phone] Fax: [phone]  [facility]  ([place]) - [place], [facility] [address] [place] [address] Phone: [phone] Fax: [phone]   Uses pill box? No - reports that she turns bottle over after she takes in the morning, turns over at the end of the day  Pt endorses 100% compliance  Care Plan and Follow Up Patient Decision:  Patient agrees to Care Plan and Follow-up.  Plan: Telephone follow up appointment with care management team member scheduled for:  3 months  and The patient has been provided with contact information for the care management team and has been advised to call with any health related questions or concerns.   Tomasa Blase, PharmD Clinical Pharmacist, [place]

## 2020-12-27 NOTE — Patient Instructions (Signed)
[name],  It was great to talk to you today!  Please call me with any questions or concerns.  Visit Information  PATIENT GOALS:  Goals Addressed             This Visit's Progress    Monitor and Manage My Blood Sugar-Diabetes Type 2   On track    Timeframe:  Long-Range Goal Priority:  High Start Date:  09/20/2020                           Expected End Date:  03/28/2021                     Follow Up Date 03/28/2021   - check blood sugar at prescribed times - check blood sugar if I feel it is too high or too low - enter blood sugar readings and medication or insulin into daily log - take the blood sugar log to all doctor visits    Why is this important?   Checking your blood sugar at home helps to keep it from getting very high or very low.  Writing the results in a diary or log helps the doctor know how to care for you.  Your blood sugar log should have the time, date and the results.  Also, write down the amount of insulin or other medicine that you take.  Other information, like what you ate, exercise done and how you were feeling, will also be helpful.     Notes: Patient to reach out should blood sugars elevate out of goal range (<150) or should she experience more frequent hypoglycemic episodes      Track and Manage My Blood Pressure-Hypertension   On track    Timeframe:  Long-Range Goal Priority:  High Start Date:   09/20/2020                          Expected End Date:  03/28/2021                    Follow Up Date 03/28/2021    - check blood pressure daily    Why is this important?   You won't feel high blood pressure, but it can still hurt your blood vessels.  High blood pressure can cause heart or kidney problems. It can also cause a stroke.  Making lifestyle changes like losing a little weight or eating less salt will help.  Checking your blood pressure at home and at different times of the day can help to control blood pressure.  If the doctor  prescribes medicine remember to take it the way the doctor ordered.  Call the office if you cannot afford the medicine or if there are questions about it.     Notes: Patient to reach out to clinic with blood pressures that are averaging above goal range (<130/80)        The patient verbalized understanding of instructions, educational materials, and care plan provided today and declined offer to receive copy of patient instructions, educational materials, and care plan.   Telephone follow up appointment with care management team member scheduled for: 3 months The patient has been provided with contact information for the care management team and has been advised to call with any health related questions or concerns.   Tomasa Blase, PharmD Clinical Pharmacist, [facility]

## 2021-01-03 IMAGING — CT CT ABD-PELV W/ CM
1 of 3 series · 13 of 32 positions shown, 18 images · IV contrast (APPLIED)
Comparison: 01/23/2017

CLINICAL DATA: Ventral incisional hernia without gangrene.
Abdominal pain and cramping. Previous surgery, chemotherapy, and
radiation therapy for colorectal carcinoma.

Creatinine was obtained on site at [HOSPITAL] at [HOSPITAL].
Results: Creatinine 0.9 mg/dL.
EXAM:
CT ABDOMEN AND PELVIS WITH CONTRAST
TECHNIQUE: Multidetector CT imaging of the abdomen and pelvis was performed
using the standard protocol following bolus administration of
intravenous contrast.
CONTRAST:  100mL [id number] IOPAMIDOL (DO92LZ-[id number]) INJECTION 61%

[Series 2: abd/pelvis w/cm · axial · 0.73mm/px · z∈[-411,-56]mm · 13 of 83 slices shown, 18 images]
[im 6/83  soft-tissue]
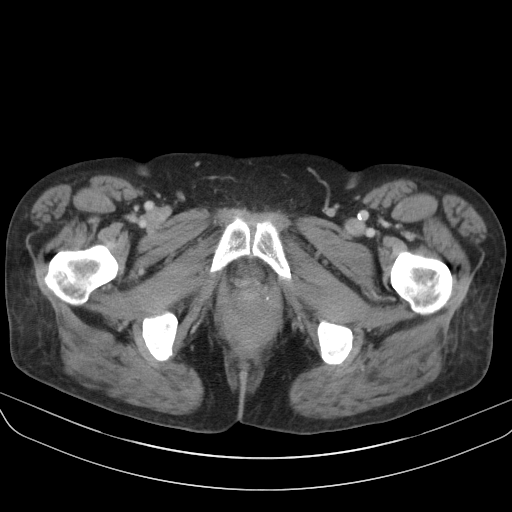
[im 6/83  bone]
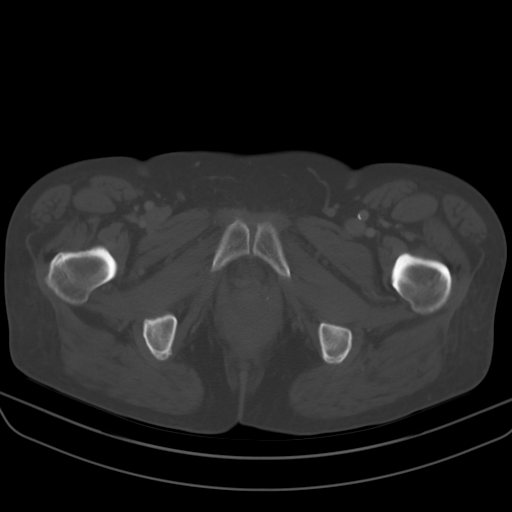
[im 11/83  soft-tissue]
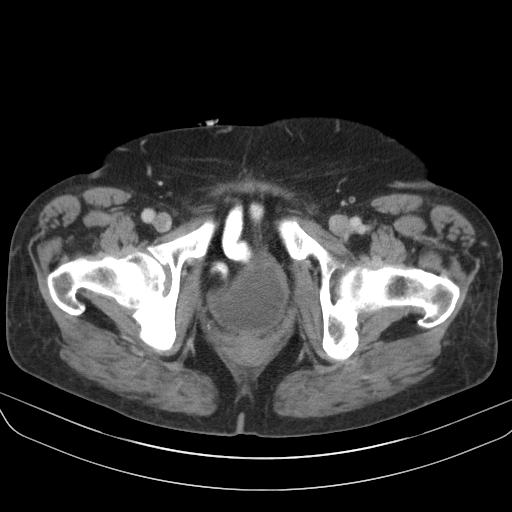
[im 17/83  soft-tissue]
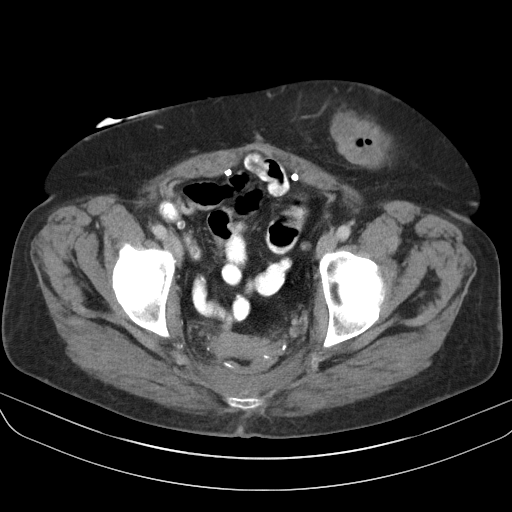
[im 28/83  soft-tissue]
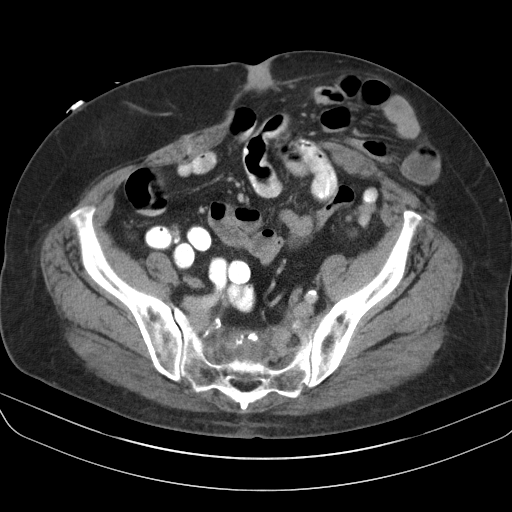
[im 33/83  soft-tissue]
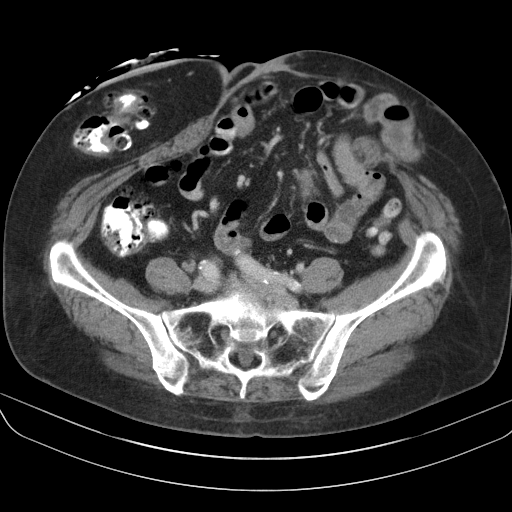
[im 39/83  soft-tissue]
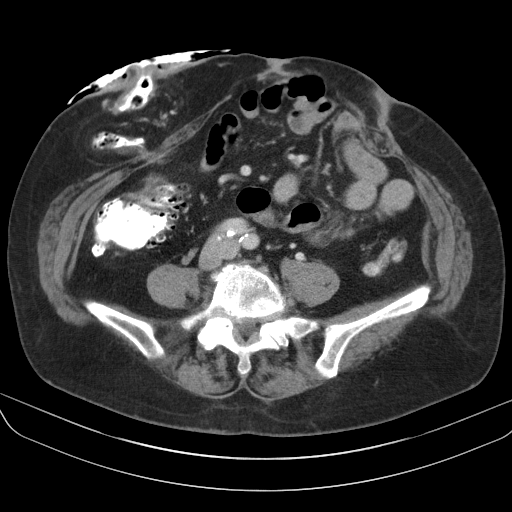
[im 44/83  soft-tissue]
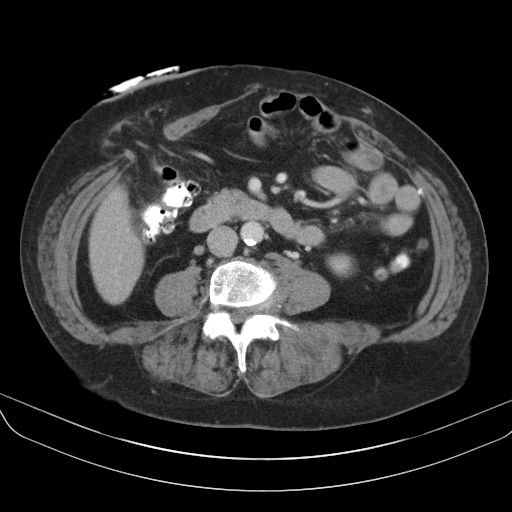
[im 50/83  soft-tissue]
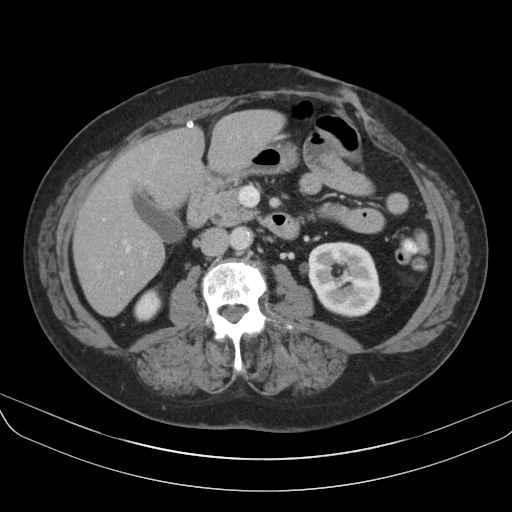
[im 55/83  soft-tissue]
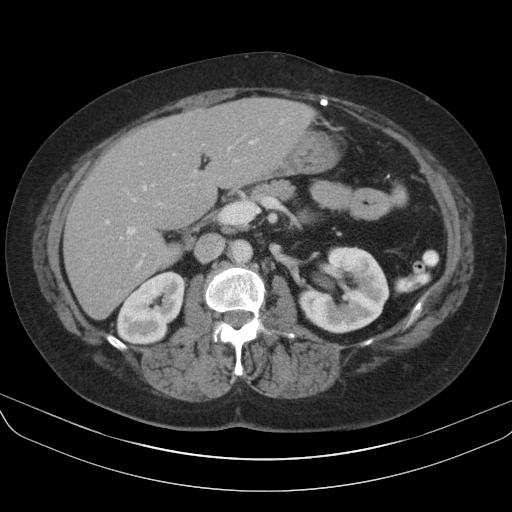
[im 55/83  bone]
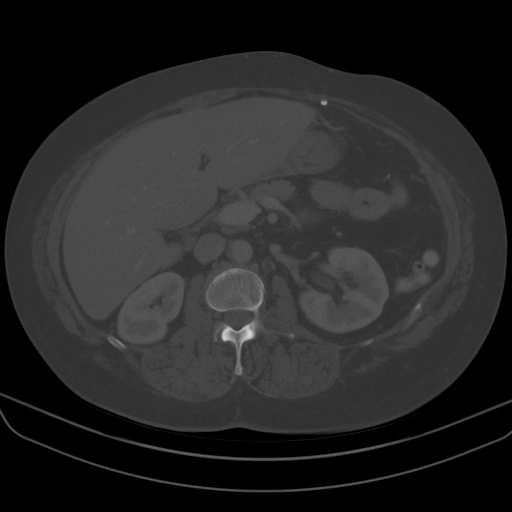
[im 61/83  lung]
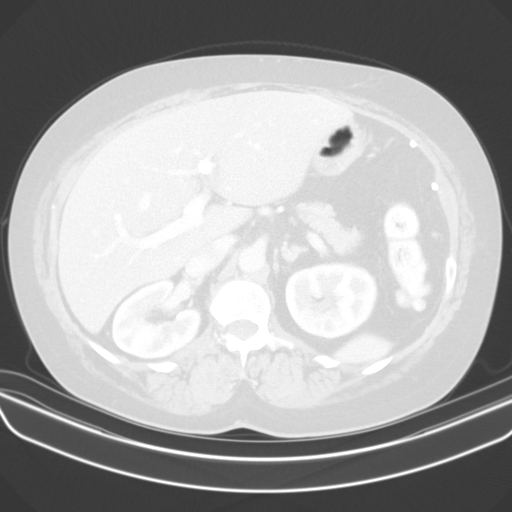
[im 66/83  soft-tissue]
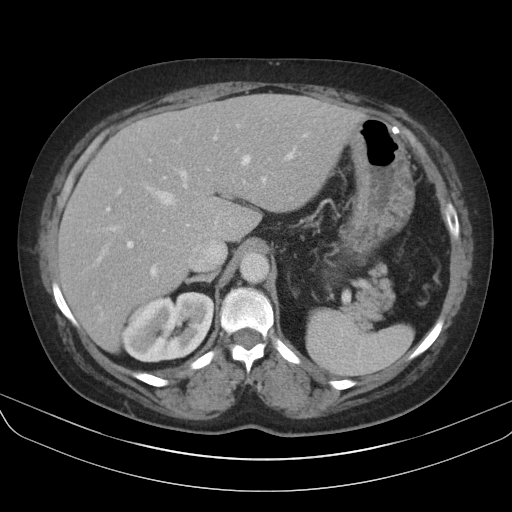
[im 66/83  lung]
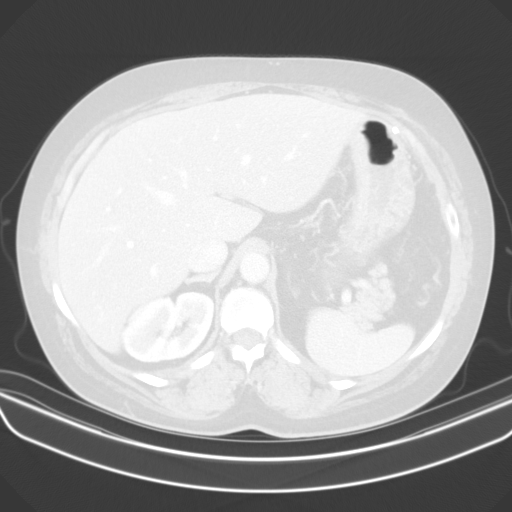
[im 72/83  soft-tissue]
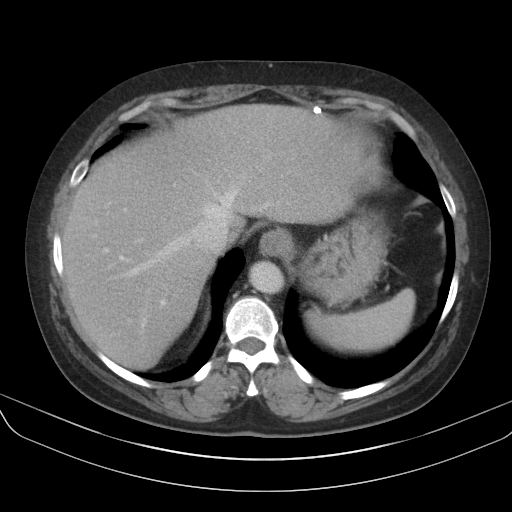
[im 72/83  lung]
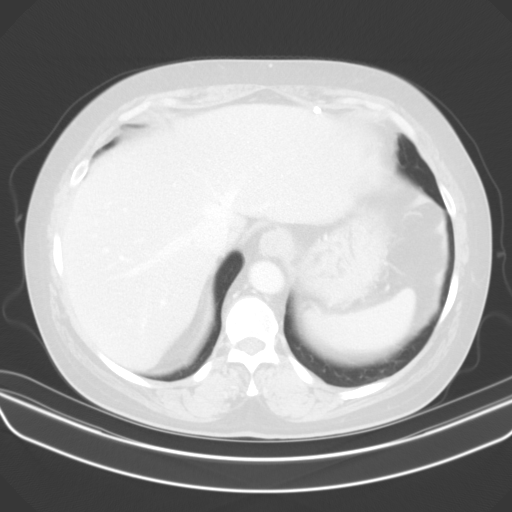
[im 77/83  soft-tissue]
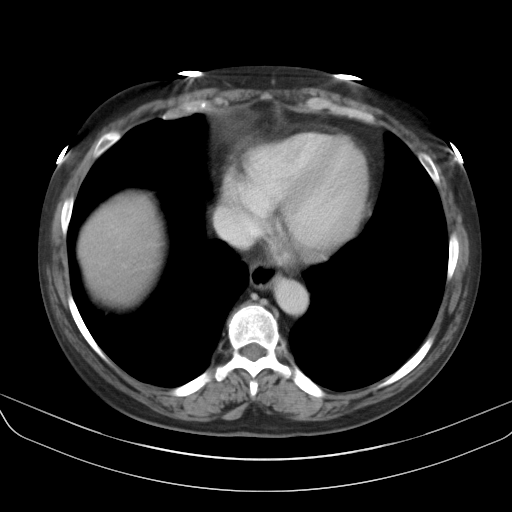
[im 77/83  lung]
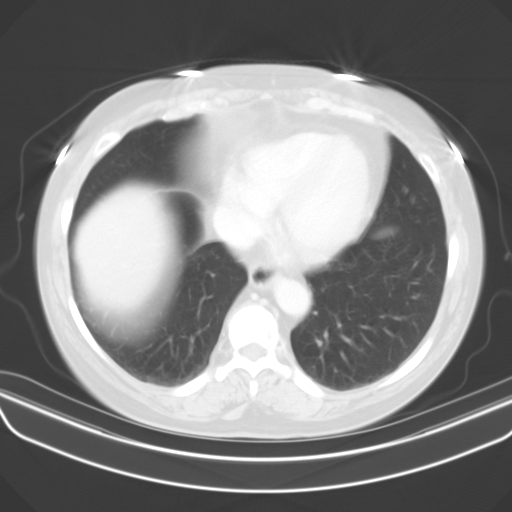

[13 of 32 positions shown; findings below may reference images not displayed]

FINDINGS: Lower Chest: No acute findings.

Hepatobiliary: No hepatic masses identified. Gallbladder is
unremarkable. No evidence of biliary ductal dilatation.

Pancreas:  No mass or inflammatory changes.

Spleen: Within normal limits in size and appearance.

Adrenals/Urinary Tract: No masses identified. Probable tiny sub-cm
right renal cyst again noted. No evidence of hydronephrosis.

Stomach/Bowel: Stable postop changes seen from transverse colectomy,
with right upper quadrant colostomy. Diffuse diverticulosis is seen
involving the remaining colon, however there is no evidence of
diverticulitis. No evidence of bowel obstruction, inflammatory
process, or abnormal fluid collections. Ill-defined soft tissue
density with calcifications in the presacral region is stable and
consistent with post treatment changes.

Vascular/Lymphatic: No pathologically enlarged lymph nodes. No
abdominal aortic aneurysm. Aortic atherosclerosis incidentally
noted.

Reproductive:  No mass or other significant abnormality.

Other: A large left paraumbilical ventral hernia is seen containing
multiple small bowel loops. This shows mild increase in size since
previous study, however there is no evidence of bowel obstruction or
strangulation.

Musculoskeletal:  No suspicious bone lesions identified.
IMPRESSION: 1. Mild increase in size of large left paraumbilical ventral hernia,
which contains multiple small bowel loops. No evidence of bowel
obstruction or strangulation.
2. No evidence of recurrent or metastatic carcinoma within the
abdomen or pelvis.
3. Colonic diverticulosis. No radiographic evidence of
diverticulitis.

## 2021-01-11 DIAGNOSIS — E1143 Type 2 diabetes mellitus with diabetic autonomic (poly)neuropathy: Secondary | ICD-10-CM | POA: Diagnosis not present

## 2021-01-11 DIAGNOSIS — I10 Essential (primary) hypertension: Secondary | ICD-10-CM | POA: Diagnosis not present

## 2021-01-17 ENCOUNTER — Telehealth: Payer: Self-pay | Admitting: Gastroenterology

## 2021-01-17 ENCOUNTER — Ambulatory Visit: Payer: Medicare Other | Admitting: Sports Medicine

## 2021-01-17 NOTE — Telephone Encounter (Signed)
Called and spoke to patient. Let her know we don't manage ostomies and supplies. She will check back with her PCP and may ask for a referral to the ostomy clinic for ongoing care.

## 2021-01-17 NOTE — Telephone Encounter (Signed)
Pt called asking if we could write her prescriptions for her ostomy supplies or if that would continue to need to be done through her PCP.  Please advise.

## 2021-01-18 ENCOUNTER — Telehealth: Payer: Self-pay | Admitting: Internal Medicine

## 2021-01-18 DIAGNOSIS — Z933 Colostomy status: Secondary | ICD-10-CM

## 2021-01-18 NOTE — Telephone Encounter (Signed)
Patient says she needs rx for ostomy medical supplies sent to:  [facility] [phone] (p)  Item # [id number]- 2 piece drainable ostomy pouch transparent 10 units (use 1 monthly)  Item #[id number]- 2 piece ostomy skin barrier tape 5 units (use 1 monthly)

## 2021-01-20 NOTE — Telephone Encounter (Signed)
printed

## 2021-01-21 ENCOUNTER — Telehealth: Payer: Self-pay | Admitting: Internal Medicine

## 2021-01-21 DIAGNOSIS — F419 Anxiety disorder, unspecified: Secondary | ICD-10-CM

## 2021-01-21 MED ORDER — ALPRAZOLAM 0.5 MG PO TABS: 0.5000 mg | ORAL_TABLET | Freq: Two times a day (BID) | ORAL | 0 refills | Status: DC | PRN

## 2021-01-21 NOTE — Telephone Encounter (Signed)
Faxed today

## 2021-01-21 NOTE — Telephone Encounter (Signed)
1.Medication Requested: ALPRAZolam (XANAX) 0.5 MG tablet  2. Pharmacy (Name, Street, [place]): [facility]  ([place], [place]  Phone:  [phone] Fax:  [phone]   3. On Med List: yes  4. Last Visit with PCP: 06.22.22  5. Next visit date with PCP: n/a   Agent: Please be advised that RX refills may take up to 3 business days. We ask that you follow-up with your pharmacy.

## 2021-02-13 NOTE — Telephone Encounter (Signed)
Called and left message for patient on home machine because VM was full. Order faxed to [facility]. I called and spoke with Stanton Kidney but she said it may take a bit for the fax to come to them because the original fax number goes to their main office and then it is sent to them.

## 2021-02-13 NOTE — Telephone Encounter (Signed)
Reprinted.

## 2021-02-13 NOTE — Telephone Encounter (Signed)
Patient calling in  Says she spoke w/ [facility] supply & they advised her that they did receive new rx faxed over from [place] but only one item was listed (see previous message below)  Patient also stated a "DX Code" needs to be attached to rx order.  Patient says she is now completely out of supplies & she needs them by end of day today  Please fu w/ patient [phone]

## 2021-02-13 NOTE — Addendum Note (Signed)
Addended by: Binnie Rail on: 02/13/2021 01:52 PM   Modules accepted: Orders

## 2021-02-13 NOTE — Telephone Encounter (Signed)
Re-faxed today.

## 2021-02-21 ENCOUNTER — Ambulatory Visit: Payer: Medicare Other | Admitting: Sports Medicine

## 2021-02-25 ENCOUNTER — Other Ambulatory Visit: Payer: Self-pay | Admitting: Internal Medicine

## 2021-02-25 DIAGNOSIS — F419 Anxiety disorder, unspecified: Secondary | ICD-10-CM

## 2021-02-27 ENCOUNTER — Telehealth: Payer: Self-pay | Admitting: Internal Medicine

## 2021-02-27 NOTE — Telephone Encounter (Signed)
Patient states she has had a sore for x3d  Patient requesting a call back for advice on otc medication she can take  Offered patient an appt, patient declined

## 2021-02-28 ENCOUNTER — Ambulatory Visit: Payer: Medicare Other | Admitting: Sports Medicine

## 2021-02-28 NOTE — Telephone Encounter (Signed)
Pt returned call, stated she is feeling a lot better. She took a covid test and it was negative.

## 2021-02-28 NOTE — Telephone Encounter (Signed)
Left message for patient to return call to clinic to see if she needs to come in or not.

## 2021-03-22 ENCOUNTER — Ambulatory Visit (INDEPENDENT_AMBULATORY_CARE_PROVIDER_SITE_OTHER): Payer: Medicare Other

## 2021-03-22 ENCOUNTER — Other Ambulatory Visit: Payer: Self-pay

## 2021-03-22 DIAGNOSIS — Z780 Asymptomatic menopausal state: Secondary | ICD-10-CM

## 2021-03-22 DIAGNOSIS — Z0000 Encounter for general adult medical examination without abnormal findings: Secondary | ICD-10-CM | POA: Diagnosis not present

## 2021-03-22 NOTE — Progress Notes (Signed)
Subjective:   [name] is a 65 y.o. female who presents for Medicare Annual (Subsequent) preventive examination.  I connected with [name] today by telephone and verified that I am speaking with the correct person using two identifiers. Location patient: home Location provider: work Persons participating in the virtual visit: patient, provider.   I discussed the limitations, risks, security and privacy concerns of performing an evaluation and management service by telephone and the availability of in person appointments. I also discussed with the patient that there may be a patient responsible charge related to this service. The patient expressed understanding and verbally consented to this telephonic visit.    Interactive audio and video telecommunications were attempted between this provider and patient, however failed, due to patient having technical difficulties OR patient did not have access to video capability.  We continued and completed visit with audio only.    Review of Systems     Cardiac Risk Factors include: advanced age (>50mn, >>73women);diabetes mellitus;dyslipidemia;hypertension     Objective:    Today's Vitals   There is no height or weight on file to calculate BMI.  Advanced Directives 03/22/2021 11/10/2020 01/02/2020 03/25/2017 10/20/2016 01/10/2016 01/09/2015  Does Patient Have a Medical Advance Directive? No No No No No No No  Would patient like information on creating a medical advance directive? No - Patient declined - No - Patient declined No - Patient declined - No - patient declined information Yes - Educational materials given    Current Medications (verified) Outpatient Encounter Medications as of 03/22/2021  Medication Sig   ALPRAZolam (XANAX) 0.5 MG tablet TAKE 1 TABLET BY MOUTH  TWICE DAILY AS NEEDED FOR  ANXIETY   Blood Glucose Monitoring Suppl (ACCU-CHEK GUIDE) w/Device KIT USE TO CHECK BLOOD SUGAR  ONCE DAILY AND AS NEEDED    colchicine 0.6 MG tablet TAKE 2 TABLETS BY MOUTH FOR ONE DOSE AND THEN 1 TABLET  BY MOUTH 1 HOUR LATER FOR  GOUT FLARE   fenofibrate (TRICOR) 145 MG tablet TAKE 1 TABLET BY MOUTH ONCE DAILY   gabapentin (NEURONTIN) 600 MG tablet TAKE 1 TABLET BY MOUTH 3  TIMES DAILY   glucose blood (FREESTYLE LITE) test strip Use as instructed   hydrochlorothiazide (HYDRODIURIL) 25 MG tablet TAKE 1 TABLET BY MOUTH  DAILY   Lancets (ONETOUCH ULTRASOFT) lancets Use as directed to check sugars once daily and as needed.  Dx E11.43   lisinopril (ZESTRIL) 10 MG tablet TAKE 1 TABLET BY MOUTH  DAILY   metFORMIN (GLUCOPHAGE) 500 MG tablet TAKE 1 TABLET BY MOUTH  TWICE DAILY WITH MEALS   rosuvastatin (CRESTOR) 5 MG tablet TAKE 1 TABLET BY MOUTH  TWICE WEEKLY   No facility-administered encounter medications on file as of 03/22/2021.    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   LAR surg 02/2009,  chemo thru 09/2009   Tibial plateau fracture, left 01/19/2014   Past Surgical History:  Procedure Laterality Date   APPENDECTOMY N/A 01/09/2015   Procedure: APPENDECTOMY;  Surgeon: Excell Seltzer, MD;  Location: [facility];  Service: General;  Laterality: N/A;   COLONOSCOPY  01/09/2015   [facility]   COLOSTOMY  2013   [facility]   FINGER FRACTURE SURGERY Left    and hand surgery   HERNIA REPAIR     LAPAROSCOPIC PARASTOMAL HERNIA N/A 05/12/2013   Procedure:  LAPAROSCOPIC PARASTOMAL HERNIA;  Surgeon: Leighton Ruff, MD;  Location: [facility];  Service: General;  Laterality: N/A;   LAPAROTOMY N/A 01/09/2015   Procedure: EXPLORATORY LAPAROTOMY;  Surgeon: Excell Seltzer, MD;  Location: [facility];  Service: General;  Laterality: N/A;  RESECTION OF COLOSTOMY, CREATION OF NEW COLOSTOMY, VENTRAL HERNIA REPAIR WITH BIOLOGIC MESH,  LYSIS OF ADHESIONS , OMENTECTOMY   LOW ANTERIOR BOWEL RESECTION  03/07/09   MOUTH SURGERY  10/2009   PORTACATH PLACEMENT     and removal   Family History  Problem Relation Age of Onset   Colon cancer Mother 58   Diabetes Father    Cirrhosis Brother    Stomach cancer Sister    Colon polyps Sister    Pulmonary Hypertension Brother    Esophageal cancer Neg Hx    Rectal cancer Neg Hx    Social History   Socioeconomic History   Marital status: Significant Other    Spouse name: Not on file   Number of children: 0   Years of education: Not on file   Highest education level: Not on file  Occupational History   Occupation: disabiled  Tobacco Use   Smoking status: Every Day    Packs/day: 0.25    Years: 40.00    Pack years: 10.00    Types: Cigarettes   Smokeless tobacco: Never   Tobacco comments:    Pt. in project free program, motivated to stop smoking completely. Declined smoking cessation [facility] program.  Vaping Use   Vaping Use: Never used  Substance and Sexual Activity   Alcohol use: Yes    Alcohol/week: 18.0 standard drinks    Types: 14 Cans of beer, 4 Glasses of wine per week   Drug use: Yes    Frequency: 1.0 times per week    Types: Marijuana, Cocaine    Comment: Pt is using Marijuana; last use of cocaine nov 2014    Sexual activity: Not on file  Other Topics Concern   Not on file  Social History Narrative   Divorced, has significant other for past 30 years; values spending time with 2 grandsons and 74 year old dog who lives with significant other   Frequent moves between family with stressors in relationship    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, jewellery, Psychologist, 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, 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   Social Determinants of Health   Financial Resource Strain: Low Risk    Difficulty of Paying Living Expenses: Not hard at all  Food Insecurity: No Food Insecurity   Worried About Charity fundraiser in the Last Year: Never true   Arboriculturist in the Last Year: Never true  Transportation Needs: No Transportation Needs   Lack of Transportation (Medical): No   Lack of Transportation (Non-Medical): No  Physical Activity: Insufficiently Active   Days of Exercise per Week: 3 days   Minutes of Exercise per Session: 30 min  Stress:  No Stress Concern Present   Feeling of Stress : Not at all  Social Connections: Moderately Isolated   Frequency of Communication with Friends and Family: Twice a week   Frequency of Social Gatherings with Friends and Family: Twice a week   Attends Religious Services: More than 4 times per year   Active Member of [employer] or Organizations: No   Attends Archivist Meetings: Never   Marital Status: Divorced    Tobacco Counseling Ready to quit: Not Answered Counseling given: Not Answered Tobacco comments: Pt. in project free program, motivated to stop smoking completely. Declined smoking cessation [facility] program.   Clinical Intake:  Pre-visit preparation completed: Yes  Pain : No/denies pain     Nutritional Risks: None Diabetes: Yes CBG done?: No Did pt. bring in CBG monitor from home?: No  How often do you need to have someone help you when you read instructions, pamphlets, or other written materials from your doctor or pharmacy?: 1 - Never What is the last grade level you completed in school?: High School  Diabetic?yes Nutrition Risk Assessment:  Has the patient had any N/V/D within the last 2 months?  No  Does the patient have any non-healing wounds?  No  Has the patient had any  unintentional weight loss or weight gain?  No   Diabetes:  Is the patient diabetic?  Yes  If diabetic, was a CBG obtained today?  No  Did the patient bring in their glucometer from home?  No  How often do you monitor your CBG's? 2-3 times day .   Financial Strains and Diabetes Management:  Are you having any financial strains with the device, your supplies or your medication? No .  Does the patient want to be seen by Chronic Care Management for management of their diabetes?  No  Would the patient like to be referred to a Nutritionist or for Diabetic Management?  No   Diabetic Exams:  Diabetic Eye Exam: Completed 08/2020 Diabetic Foot Exam: Overdue, Pt has been advised about the importance in completing this exam. Pt is scheduled for diabetic foot exam on next office visit .   Interpreter Needed?: No  Information entered by :: [place] of Daily Living In your present state of health, do you have any difficulty performing the following activities: 03/22/2021  Hearing? N  Vision? N  Difficulty concentrating or making decisions? N  Walking or climbing stairs? N  Dressing or bathing? N  Doing errands, shopping? N  Preparing Food and eating ? N  Using the Toilet? N  In the past six months, have you accidently leaked urine? N  Do you have problems with loss of bowel control? N  Managing your Medications? N  Managing your Finances? N  Housekeeping or managing your Housekeeping? N  Some recent data might be hidden    Patient Care Team: Binnie Rail, MD as PCP - General (Internal Medicine) Nunzio Cobbs, MD (Obstetrics and Gynecology) Jana Half, DPM (Podiatry) Heath Lark, MD as Consulting Physician (Hematology and Oncology) Leighton Ruff, MD (General Surgery) [facility], Carlota Raspberry, MD as Consulting Physician (Gastroenterology) Delice Bison Darnelle Maffucci, [facility] as Pharmacist (Pharmacist)  Indicate any recent Medical Services you may have received from  other than [facility] providers in the past year (date may be approximate).     Assessment:   This is a routine wellness examination for Muslima.  Hearing/Vision screen Vision Screening - Comments:: Annual eye exam wear glasses  Dietary issues and exercise activities discussed: Current Exercise Habits: Home exercise routine, Type of exercise: walking, Time (Minutes): 20, Frequency (Times/Week): 3, Weekly Exercise (Minutes/Week): 60, Intensity: Mild, Exercise limited by: None identified   Goals Addressed   None    Depression Screen PHQ 2/9 Scores 03/22/2021 10/03/2020 01/02/2020 11/21/2019 04/20/2018 07/20/2013  PHQ - 2 Score 0 0 1 0 0 0  PHQ- 9 Score - - - - 2 -    Fall Risk Fall Risk  03/22/2021 10/03/2020 01/02/2020 11/21/2019 06/13/2019  Falls in the past year? 0 0 '1 1 1  ' Number falls in past yr: 0 0 1 0 1  Injury with Fall? 0 0 0 0 0  Risk for fall due to : - - Impaired balance/gait;History of fall(s);Medication side effect;Orthopedic patient Orthopedic patient -  Follow up Falls evaluation completed - Falls evaluation completed;Education provided Falls evaluation completed -    FALL RISK PREVENTION PERTAINING TO THE HOME:  Any stairs in or around the home? No  If so, are there any without handrails? No  Home free of loose throw rugs in walkways, pet beds, electrical cords, etc? Yes  Adequate lighting in your home to reduce risk of falls? Yes   ASSISTIVE DEVICES UTILIZED TO PREVENT FALLS:  Life alert? No  Use of a cane, walker or w/c? No  Grab bars in the bathroom? Yes  Shower chair or bench in shower? Yes  Elevated toilet seat or a handicapped toilet? No    Cognitive Function:  Normal cognitive status assessed by direct observation by this Nurse Health Advisor. No abnormalities found.     6CIT Screen 01/02/2020  What Year? 0 points  What month? 0 points  What time? 0 points  Count back from 20 0 points  Months in reverse 0 points  Repeat phrase 0 points  Total Score 0     Immunizations Immunization History  Administered Date(s) Administered   Influenza Whole 01/02/2020   Influenza,inj,Quad PF,6+ Mos 03/18/2013, 05/23/2014, 01/10/2016, 04/03/2017, 12/09/2017, 12/13/2018   PFIZER(Purple Top)SARS-COV-2 Vaccination 07/07/2019, 07/28/2019   Pneumococcal Conjugate-13 05/23/2014   Pneumococcal Polysaccharide-23 06/12/2016   Td 05/01/2010    TDAP status: Due, Education has been provided regarding the importance of this vaccine. Advised may receive this vaccine at local pharmacy or Health Dept. Aware to provide a copy of the vaccination record if obtained from local pharmacy or Health Dept. Verbalized acceptance and understanding.  Flu Vaccine status: Due, Education has been provided regarding the importance of this vaccine. Advised may receive this vaccine at local pharmacy or Health Dept. Aware to provide a copy of the vaccination record if obtained from local pharmacy or Health Dept. Verbalized acceptance and understanding.  Pneumococcal vaccine status: Up to date  Covid-19 vaccine status: Completed vaccines  Qualifies for Shingles Vaccine? Yes   Zostavax completed No   Shingrix Completed?: No.    Education has been provided regarding the importance of this vaccine. Patient has been advised to call insurance company to determine out of pocket expense if they have not yet received this vaccine. Advised may also receive vaccine at local pharmacy or Health Dept. Verbalized acceptance and understanding.  Screening Tests Health Maintenance  Topic Date Due   Zoster Vaccines- Shingrix (1 of 2) Never done   PAP SMEAR-Modifier  12/12/2013   COVID-19 Vaccine (3 - Pfizer risk series) 08/25/2019   INFLUENZA VACCINE  11/12/2020   DEXA SCAN  12/04/2020   HEMOGLOBIN A1C  12/14/2020   Pneumococcal Vaccine 22-80 Years old (  3 - PPSV23 if available, else PCV20) 06/12/2021   TETANUS/TDAP  10/03/2021 (Originally 05/01/2020)   MAMMOGRAM  06/12/2021   OPHTHALMOLOGY EXAM   08/12/2021   FOOT EXAM  10/03/2021   COLONOSCOPY (Pts 45-31yr Insurance coverage will need to be confirmed)  03/01/2025   Hepatitis C Screening  Completed   HIV Screening  Completed   HPV VACCINES  Aged Out    Health Maintenance  Health Maintenance Due  Topic Date Due   Zoster Vaccines- Shingrix (1 of 2) Never done   PAP SMEAR-Modifier  12/12/2013   COVID-19 Vaccine (3 - Pfizer risk series) 08/25/2019   INFLUENZA VACCINE  11/12/2020   DEXA SCAN  12/04/2020   HEMOGLOBIN A1C  12/14/2020   Pneumococcal Vaccine 116659Years old (3 - PPSV23 if available, else PCV20) 06/12/2021    Colorectal cancer screening: Type of screening: Colonoscopy. Completed 02/20/2020. Repeat every 5 years  Mammogram status: Completed 06/12/2020. Repeat every year  Bone Density status: Ordered 04/11/2021. Pt provided with contact info and advised to call to schedule appt.  Lung Cancer Screening: (Low Dose CT Chest recommended if Age 64-80years, 30 pack-year currently smoking OR have quit w/in 15years.) does not qualify.   Lung Cancer Screening Referral: n/a  Additional Screening:  Hepatitis C Screening: does not qualify; Completed 10/09/2015  Vision Screening: Recommended annual ophthalmology exams for early detection of glaucoma and other disorders of the eye. Is the patient up to date with their annual eye exam?  Yes  Who is the provider or what is the name of the office in which the patient attends annual eye exams? [facility]  If pt is not established with a provider, would they like to be referred to a provider to establish care? No .   Dental Screening: Recommended annual dental exams for proper oral hygiene  Community Resource Referral / Chronic Care Management: CRR required this visit?  No   CCM required this visit?  No      Plan:     I have personally reviewed and noted the following in the patient's chart:   Medical and social history Use of alcohol, tobacco or illicit drugs   Current medications and supplements including opioid prescriptions.  Functional ability and status Nutritional status Physical activity Advanced directives List of other physicians Hospitalizations, surgeries, and ER visits in previous 12 months Vitals Screenings to include cognitive, depression, and falls Referrals and appointments  In addition, I have reviewed and discussed with patient certain preventive protocols, quality metrics, and best practice recommendations. A written personalized care plan for preventive services as well as general preventive health recommendations were provided to patient.     LRandel Pigg LPN   [id number]  Nurse Notes: none

## 2021-03-22 NOTE — Patient Instructions (Signed)
[name] , Thank you for taking time to come for your Medicare Wellness Visit. I appreciate your ongoing commitment to your health goals. Please review the following plan we discussed and let me know if I can assist you in the future.   Screening recommendations/referrals: Colonoscopy: 03/01/2020 Mammogram: 06/12/2020 Bone Density: ordered 12/9/202 Recommended yearly ophthalmology/optometry visit for glaucoma screening and checkup Recommended yearly dental visit for hygiene and checkup  Vaccinations: Influenza vaccine: completed  Pneumococcal vaccine: completed  Tdap vaccine: due  Shingles vaccine: will obtain     Advanced directives: none   Conditions/risks identified: none   Next appointment: 03/27/2021  1020am  Dr Quay Burow    Preventive Care 65 Years and Older, Female Preventive care refers to lifestyle choices and visits with your health care provider that can promote health and wellness. What does preventive care include? A yearly physical exam. This is also called an annual well check. Dental exams once or twice a year. Routine eye exams. Ask your health care provider how often you should have your eyes checked. Personal lifestyle choices, including: Daily care of your teeth and gums. Regular physical activity. Eating a healthy diet. Avoiding tobacco and drug use. Limiting alcohol use. Practicing safe sex. Taking low-dose aspirin every day. Taking vitamin and mineral supplements as recommended by your health care provider. What happens during an annual well check? The services and screenings done by your health care provider during your annual well check will depend on your age, overall health, lifestyle risk factors, and family history of disease. Counseling  Your health care provider may ask you questions about your: Alcohol use. Tobacco use. Drug use. Emotional well-being. Home and relationship well-being. Sexual activity. Eating habits. History of  falls. Memory and ability to understand (cognition). Work and work Statistician. Reproductive health. Screening  You may have the following tests or measurements: Height, weight, and BMI. Blood pressure. Lipid and cholesterol levels. These may be checked every 5 years, or more frequently if you are over 70 years old. Skin check. Lung cancer screening. You may have this screening every year starting at age 31 if you have a 30-pack-year history of smoking and currently smoke or have quit within the past 15 years. Fecal occult blood test (FOBT) of the stool. You may have this test every year starting at age 56. Flexible sigmoidoscopy or colonoscopy. You may have a sigmoidoscopy every 5 years or a colonoscopy every 10 years starting at age 33. Hepatitis C blood test. Hepatitis B blood test. Sexually transmitted disease (STD) testing. Diabetes screening. This is done by checking your blood sugar (glucose) after you have not eaten for a while (fasting). You may have this done every 1-3 years. Bone density scan. This is done to screen for osteoporosis. You may have this done starting at age 20. Mammogram. This may be done every 1-2 years. Talk to your health care provider about how often you should have regular mammograms. Talk with your health care provider about your test results, treatment options, and if necessary, the need for more tests. Vaccines  Your health care provider may recommend certain vaccines, such as: Influenza vaccine. This is recommended every year. Tetanus, diphtheria, and acellular pertussis (Tdap, Td) vaccine. You may need a Td booster every 10 years. Zoster vaccine. You may need this after age 70. Pneumococcal 13-valent conjugate (PCV13) vaccine. One dose is recommended after age 82. Pneumococcal polysaccharide (PPSV23) vaccine. One dose is recommended after age 39. Talk to your health care provider about which screenings  and vaccines you need and how often you need  them. This information is not intended to replace advice given to you by your health care provider. Make sure you discuss any questions you have with your health care provider. Document Released: 04/27/2015 Document Revised: 12/19/2015 Document Reviewed: 01/30/2015 Elsevier Interactive Patient Education  2017 [place] Prevention in the Home Falls can cause injuries. They can happen to people of all ages. There are many things you can do to make your home safe and to help prevent falls. What can I do on the outside of my home? Regularly fix the edges of walkways and driveways and fix any cracks. Remove anything that might make you trip as you walk through a door, such as a raised step or threshold. Trim any bushes or trees on the path to your home. Use bright outdoor lighting. Clear any walking paths of anything that might make someone trip, such as rocks or tools. Regularly check to see if handrails are loose or broken. Make sure that both sides of any steps have handrails. Any raised decks and porches should have guardrails on the edges. Have any leaves, snow, or ice cleared regularly. Use sand or salt on walking paths during winter. Clean up any spills in your garage right away. This includes oil or grease spills. What can I do in the bathroom? Use night lights. Install grab bars by the toilet and in the tub and shower. Do not use towel bars as grab bars. Use non-skid mats or decals in the tub or shower. If you need to sit down in the shower, use a plastic, non-slip stool. Keep the floor dry. Clean up any water that spills on the floor as soon as it happens. Remove soap buildup in the tub or shower regularly. Attach bath mats securely with double-sided non-slip rug tape. Do not have throw rugs and other things on the floor that can make you trip. What can I do in the bedroom? Use night lights. Make sure that you have a light by your bed that is easy to reach. Do not use  any sheets or blankets that are too big for your bed. They should not hang down onto the floor. Have a firm chair that has side arms. You can use this for support while you get dressed. Do not have throw rugs and other things on the floor that can make you trip. What can I do in the kitchen? Clean up any spills right away. Avoid walking on wet floors. Keep items that you use a lot in easy-to-reach places. If you need to reach something above you, use a strong step stool that has a grab bar. Keep electrical cords out of the way. Do not use floor polish or wax that makes floors slippery. If you must use wax, use non-skid floor wax. Do not have throw rugs and other things on the floor that can make you trip. What can I do with my stairs? Do not leave any items on the stairs. Make sure that there are handrails on both sides of the stairs and use them. Fix handrails that are broken or loose. Make sure that handrails are as long as the stairways. Check any carpeting to make sure that it is firmly attached to the stairs. Fix any carpet that is loose or worn. Avoid having throw rugs at the top or bottom of the stairs. If you do have throw rugs, attach them to the floor with carpet tape.  Make sure that you have a light switch at the top of the stairs and the bottom of the stairs. If you do not have them, ask someone to add them for you. What else can I do to help prevent falls? Wear shoes that: Do not have high heels. Have rubber bottoms. Are comfortable and fit you well. Are closed at the toe. Do not wear sandals. If you use a stepladder: Make sure that it is fully opened. Do not climb a closed stepladder. Make sure that both sides of the stepladder are locked into place. Ask someone to hold it for you, if possible. Clearly mark and make sure that you can see: Any grab bars or handrails. First and last steps. Where the edge of each step is. Use tools that help you move around (mobility aids)  if they are needed. These include: Canes. Walkers. Scooters. Crutches. Turn on the lights when you go into a dark area. Replace any light bulbs as soon as they burn out. Set up your furniture so you have a clear path. Avoid moving your furniture around. If any of your floors are uneven, fix them. If there are any pets around you, be aware of where they are. Review your medicines with your doctor. Some medicines can make you feel dizzy. This can increase your chance of falling. Ask your doctor what other things that you can do to help prevent falls. This information is not intended to replace advice given to you by your health care provider. Make sure you discuss any questions you have with your health care provider. Document Released: 01/25/2009 Document Revised: 09/06/2015 Document Reviewed: 05/05/2014 Elsevier Interactive Patient Education  2017 [employer].

## 2021-03-26 NOTE — Progress Notes (Signed)
Subjective:    Patient ID: [name], female    DOB: 01-05-1956, 65 y.o.   MRN: [id number]  This visit occurred during the SARS-CoV-2 public health emergency.  Safety protocols were in place, including screening questions prior to the visit, additional usage of staff PPE, and extensive cleaning of exam room while observing appropriate contact time as indicated for disinfecting solutions.    HPI The patient is here for an acute visit.  She is also due for follow up of her chronic medical problems. - htn, DM, hld, peripheral neuropathy   Abdominal wall hernia - discomfort.  This is a recurrent hernia.  She really wants to have this fixed.  She has seen surgery in the past for it, but they wanted her to stop smoking before they would do it and she has not been able to stop smoking.  She is trying to do vaping and hopefully eventually stop, but it is difficult.  Having pain in her left buttock.  The pain is constant for the past couple of weeks.  No NT.  No pain into leg.  She has had pain across her lower back, but denies any pain in her back now.  It does hurt when she sits.  Feces discharge from rectum  - does not look like mucus - looks like feces.  Initially gray paste color and not looks like feces.     She has had increased anxiety and is taking the alprazolam more consistently-it does help.  Having gout every 3-5 months.  She takes colchicine as needed and it works well.  Medications and allergies reviewed with patient and updated if appropriate.  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   B12 deficiency 06/15/2016   Anxiety 06/12/2016   Type 2 diabetes mellitus with diabetic autonomic neuropathy, without long-term current use of insulin ([place]) 01/10/2016    Peristomal hernia 04/19/2012   Seborrheic dermatitis 08/09/2010   PERSONAL HX RECTAL CANCER 06/05/2010   Tobacco dependence due to cigarettes 04/16/2010   ALLERGIC RHINITIS 04/16/2010   Peripheral neuropathy ([place]) 12/06/2009   Essential hypertension 12/06/2009   Diarrhea 12/06/2009    Current Outpatient Medications on File Prior to Visit  Medication Sig Dispense Refill   ALPRAZolam (XANAX) 0.5 MG tablet TAKE 1 TABLET BY MOUTH  TWICE DAILY AS NEEDED FOR  ANXIETY 90 tablet 0   Blood Glucose Monitoring Suppl (ACCU-CHEK GUIDE) w/Device KIT USE TO CHECK BLOOD SUGAR  ONCE DAILY AND AS NEEDED 1 kit 2   colchicine 0.6 MG tablet TAKE 2 TABLETS BY MOUTH FOR ONE DOSE AND THEN 1 TABLET  BY MOUTH 1 HOUR LATER FOR  GOUT FLARE 21 tablet 3   fenofibrate (TRICOR) 145 MG tablet TAKE 1 TABLET BY MOUTH ONCE DAILY 90 tablet 3   gabapentin (NEURONTIN) 600 MG tablet TAKE 1 TABLET BY MOUTH 3  TIMES DAILY 270 tablet 3   glucose blood (FREESTYLE LITE) test strip Use as instructed 100 each 12   hydrochlorothiazide (HYDRODIURIL) 25 MG tablet TAKE 1 TABLET BY MOUTH  DAILY 90 tablet 3   Lancets (ONETOUCH ULTRASOFT) lancets Use as directed to check sugars once daily and as needed.  Dx E11.43 100 each 12   lisinopril (ZESTRIL) 10 MG tablet TAKE 1 TABLET BY MOUTH  DAILY  90 tablet 3   metFORMIN (GLUCOPHAGE) 500 MG tablet TAKE 1 TABLET BY MOUTH  TWICE DAILY WITH MEALS 180 tablet 3   rosuvastatin (CRESTOR) 5 MG tablet TAKE 1 TABLET BY MOUTH  TWICE WEEKLY 26 tablet 3   No current facility-administered medications on file prior to visit.    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   LAR surg 02/2009, chemo thru 09/2009   Tibial plateau fracture, left 01/19/2014    Past Surgical History:  Procedure Laterality Date   APPENDECTOMY N/A 01/09/2015   Procedure: APPENDECTOMY;  Surgeon: Excell Seltzer, MD;  Location: [facility];  Service: General;  Laterality: N/A;   COLONOSCOPY  01/09/2015   [facility]   COLOSTOMY  2013   [facility]   FINGER FRACTURE SURGERY Left    and hand surgery   HERNIA REPAIR     LAPAROSCOPIC PARASTOMAL HERNIA N/A 05/12/2013   Procedure: LAPAROSCOPIC PARASTOMAL HERNIA;  Surgeon: Leighton Ruff, MD;  Location: [facility];  Service: General;  Laterality: N/A;   LAPAROTOMY N/A 01/09/2015   Procedure: EXPLORATORY LAPAROTOMY;  Surgeon: Excell Seltzer, MD;  Location: [facility];  Service: General;  Laterality: N/A;  RESECTION OF COLOSTOMY, CREATION OF NEW COLOSTOMY, VENTRAL HERNIA REPAIR WITH BIOLOGIC MESH,  LYSIS OF ADHESIONS , OMENTECTOMY   LOW ANTERIOR BOWEL RESECTION  03/07/09   MOUTH SURGERY  10/2009   PORTACATH PLACEMENT     and removal    Social History   Socioeconomic History   Marital status: Significant Other    Spouse name: Not on file   Number of children: 0   Years of education: Not on file   Highest education level: Not on file  Occupational History   Occupation: disabiled  Tobacco Use   Smoking status: Every Day    Packs/day: 0.25    Years: 40.00    Pack years: 10.00    Types: Cigarettes   Smokeless tobacco: Never   Tobacco comments:    Pt. in project free program, motivated to stop smoking completely. Declined smoking cessation cone program.  Vaping Use   Vaping Use: Never used  Substance and Sexual Activity   Alcohol use: Yes    Alcohol/week: 18.0 standard drinks    Types: 14 Cans of beer, 4 Glasses of wine per week   Drug use: Yes    Frequency: 1.0 times per week    Types: Marijuana, Cocaine    Comment: Pt is using  Marijuana; last use of cocaine nov 2014    Sexual activity: Not on file  Other Topics Concern   Not on file  Social History Narrative   Divorced, has significant other for past 30 years; values spending time with 2 grandsons and 59 year old dog who lives with significant other   Frequent moves between family with stressors in relationship   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, jewellery, Psychologist, 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, 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   Social Determinants of Health   Financial Resource Strain: Low Risk    Difficulty of Paying Living Expenses: Not hard at all  Food Insecurity: No Food Insecurity   Worried About Charity fundraiser in the Last Year: Never true   Arboriculturist in the Last Year: Never true  Transportation Needs: No Transportation Needs   Lack of Transportation (Medical): No   Lack of Transportation (Non-Medical): No  Physical Activity: Insufficiently Active   Days of Exercise per Week: 3 days   Minutes of Exercise per Session: 30 min  Stress: No Stress Concern Present   Feeling of Stress : Not at all  Social Connections: Moderately Isolated   Frequency of Communication with Friends and Family: Twice a week   Frequency of Social Gatherings with Friends and Family: Twice a week   Attends Religious Services: More than 4 times per year   Active Member of [employer] or Organizations: No   Attends Music therapist: Never   Marital Status: Divorced    Family History  Problem Relation Age of Onset   Colon cancer Mother 21   Diabetes Father    Cirrhosis Brother    Stomach cancer Sister    Colon polyps Sister    Pulmonary Hypertension Brother    Esophageal cancer Neg Hx    Rectal cancer Neg Hx     Review of Systems  Constitutional:  Negative for chills and fever.  Respiratory:  Positive for cough (from smoking). Negative for shortness of breath and wheezing.   Cardiovascular:  Negative for chest pain,  palpitations and leg swelling.  Gastrointestinal:  Positive for abdominal pain (discomfort from hernia).  Neurological:  Negative for light-headedness and headaches.      Objective:   Vitals:   03/27/21 1032  BP: 118/68  Pulse: 65  Temp: 98.2 F (36.8 C)  SpO2: 98%   BP Readings from Last 3 Encounters:  03/27/21 118/68  11/10/20 (!) 91/55  10/03/20 122/68   Wt Readings from Last 3 Encounters:  03/27/21 168 lb (76.2 kg)  11/10/20 175 lb (79.4 kg)  10/03/20 169 lb (76.7 kg)   Body mass index is 27.12 kg/m.   Physical Exam    Constitutional: Appears well-developed and well-nourished. No distress.  Head: Normocephalic and atraumatic.  Neck: Neck supple. No tracheal deviation present. No thyromegaly present.  No cervical lymphadenopathy Cardiovascular: Normal rate, regular rhythm and normal heart sounds.  No murmur heard. No carotid bruit .  No edema Pulmonary/Chest: Effort normal and breath sounds normal. No respiratory distress. No has no wheezes. No rales. Abdomen: 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place in right mid abdomen, large abdominal wall hernia left lower quadrant-nontender Musculoskeletal: Tenderness with palpation left ischial tuberosity, no lumbar spine or lower back tenderness with palpation Skin: Skin is warm and dry. Not diaphoretic.  Psychiatric: Normal mood and affect. Behavior is normal.       Assessment & Plan:    See Problem List for Assessment and Plan of chronic medical problems.

## 2021-03-26 NOTE — Patient Instructions (Addendum)
°  Flu immunization administered today.     Blood work was ordered.     Medications changes include :   none    A referral was ordered for surgery and sports medicine.      Someone from their office will call you to schedule an appointment.    Please followup in 6 months

## 2021-03-27 ENCOUNTER — Encounter: Payer: Self-pay | Admitting: Internal Medicine

## 2021-03-27 ENCOUNTER — Ambulatory Visit (INDEPENDENT_AMBULATORY_CARE_PROVIDER_SITE_OTHER): Payer: Medicare Other | Admitting: Internal Medicine

## 2021-03-27 ENCOUNTER — Other Ambulatory Visit: Payer: Self-pay

## 2021-03-27 VITALS — BP 118/68 | HR 65 | Temp 98.2°F | Ht 66.0 in | Wt 168.0 lb

## 2021-03-27 DIAGNOSIS — Z8739 Personal history of other diseases of the musculoskeletal system and connective tissue: Secondary | ICD-10-CM | POA: Diagnosis not present

## 2021-03-27 DIAGNOSIS — I10 Essential (primary) hypertension: Secondary | ICD-10-CM | POA: Diagnosis not present

## 2021-03-27 DIAGNOSIS — I700 Atherosclerosis of aorta: Secondary | ICD-10-CM | POA: Diagnosis not present

## 2021-03-27 DIAGNOSIS — F419 Anxiety disorder, unspecified: Secondary | ICD-10-CM

## 2021-03-27 DIAGNOSIS — E538 Deficiency of other specified B group vitamins: Secondary | ICD-10-CM

## 2021-03-27 DIAGNOSIS — E1143 Type 2 diabetes mellitus with diabetic autonomic (poly)neuropathy: Secondary | ICD-10-CM | POA: Diagnosis not present

## 2021-03-27 DIAGNOSIS — G6289 Other specified polyneuropathies: Secondary | ICD-10-CM

## 2021-03-27 DIAGNOSIS — M7918 Myalgia, other site: Secondary | ICD-10-CM | POA: Insufficient documentation

## 2021-03-27 DIAGNOSIS — K439 Ventral hernia without obstruction or gangrene: Secondary | ICD-10-CM | POA: Diagnosis not present

## 2021-03-27 DIAGNOSIS — F1010 Alcohol abuse, uncomplicated: Secondary | ICD-10-CM

## 2021-03-27 DIAGNOSIS — E781 Pure hyperglyceridemia: Secondary | ICD-10-CM | POA: Diagnosis not present

## 2021-03-27 DIAGNOSIS — Z23 Encounter for immunization: Secondary | ICD-10-CM | POA: Diagnosis not present

## 2021-03-27 LAB — COMPREHENSIVE METABOLIC PANEL
ALT: 11 U/L (ref 0–35)
AST: 17 U/L (ref 0–37)
Albumin: 4.3 g/dL (ref 3.5–5.2)
Alkaline Phosphatase: 69 U/L (ref 39–117)
BUN: 28 mg/dL — ABNORMAL HIGH (ref 6–23)
CO2: 24 mEq/L (ref 19–32)
Calcium: 9.7 mg/dL (ref 8.4–10.5)
Chloride: 106 mEq/L (ref 96–112)
Creatinine, Ser: 1.27 mg/dL — ABNORMAL HIGH (ref 0.40–1.20)
GFR: 44.6 mL/min — ABNORMAL LOW (ref >60.00)
Glucose, Bld: 100 mg/dL — ABNORMAL HIGH (ref 70–99)
Potassium: 4.2 mEq/L (ref 3.5–5.1)
Sodium: 138 mEq/L (ref 135–145)
Total Bilirubin: 0.4 mg/dL (ref 0.2–1.2)
Total Protein: 7.1 g/dL (ref 6.0–8.3)

## 2021-03-27 LAB — LIPID PANEL
Cholesterol: 142 mg/dL (ref 0–200)
HDL: 49.9 mg/dL (ref >39.00)
LDL Cholesterol: 68 mg/dL (ref 0–99)
NonHDL: 92.03
Total CHOL/HDL Ratio: 3
Triglycerides: 119 mg/dL (ref 0.0–149.0)
VLDL: 23.8 mg/dL (ref 0.0–40.0)

## 2021-03-27 LAB — CBC WITH DIFFERENTIAL/PLATELET
Basophils Absolute: 0 10*3/uL (ref 0.0–0.1)
Basophils Relative: 0.5 % (ref 0.0–3.0)
Eosinophils Absolute: 0.1 10*3/uL (ref 0.0–0.7)
Eosinophils Relative: 1.4 % (ref 0.0–5.0)
HCT: 39.3 % (ref 36.0–46.0)
Hemoglobin: 12.6 g/dL (ref 12.0–15.0)
Lymphocytes Relative: 25.1 % (ref 12.0–46.0)
Lymphs Abs: 1.9 10*3/uL (ref 0.7–4.0)
MCHC: 31.9 g/dL (ref 30.0–36.0)
MCV: 91.2 fl (ref 78.0–100.0)
Monocytes Absolute: 0.6 10*3/uL (ref 0.1–1.0)
Monocytes Relative: 7.5 % (ref 3.0–12.0)
Neutro Abs: 5 10*3/uL (ref 1.4–7.7)
Neutrophils Relative %: 65.5 % (ref 43.0–77.0)
Platelets: 322 10*3/uL (ref 150.0–400.0)
RBC: 4.31 Mil/uL (ref 3.87–5.11)
RDW: 14.5 % (ref 11.5–15.5)
WBC: 7.7 10*3/uL (ref 4.0–10.5)

## 2021-03-27 LAB — HEMOGLOBIN A1C: Hgb A1c MFr Bld: 6.6 % — ABNORMAL HIGH (ref 4.6–6.5)

## 2021-03-27 LAB — VITAMIN B12: Vitamin B-12: 209 pg/mL — ABNORMAL LOW (ref 211–911)

## 2021-03-27 LAB — URIC ACID: Uric Acid, Serum: 6.7 mg/dL (ref 2.4–7.0)

## 2021-03-27 NOTE — Assessment & Plan Note (Signed)
Chronic Continue gabapentin 600 mg 3 times daily

## 2021-03-27 NOTE — Assessment & Plan Note (Signed)
Chronic Blood pressure well controlled CMP Continue hydrochlorothiazide 25 mg daily, lisinopril 10 mg daily

## 2021-03-27 NOTE — Assessment & Plan Note (Signed)
Chronic Check A1c Continue metformin 500 mg twice daily-we will titrate if needed

## 2021-03-27 NOTE — Assessment & Plan Note (Signed)
Chronic Gets a gout attack every 3-5 months Takes colchicine as needed-3 pills only and that works well Will check uric acid level She is not interested in a preventative medication at this time

## 2021-03-27 NOTE — Assessment & Plan Note (Addendum)
Chronic Recurrent Has seen surgery in the past and that time they would not operate until she quits smoking, which she is trying to do, but has not been successful Will refer to surgery for reevaluation and also because she is experiencing fecal discharge from her rectum and has a colostomy in place and this should not be happening

## 2021-03-27 NOTE — Addendum Note (Signed)
Addended by: Marcina Millard on: 03/27/2021 03:33 PM   Modules accepted: Orders

## 2021-03-27 NOTE — Assessment & Plan Note (Signed)
Chronic ?Check B12 level ?

## 2021-03-27 NOTE — Assessment & Plan Note (Signed)
Acute No radiation of lower back pain, no numbness or tingling Tenderness at her left ischial tuberosity-?  Bursitis Will refer to sports medicine for further evaluation

## 2021-03-27 NOTE — Assessment & Plan Note (Signed)
Chronic Has had increased stress recently She is taking alprazolam on a regular basis-encouraged her to keep this to a minimum-discussed that it is addicting Continue alprazolam 0.5 mg twice daily as needed

## 2021-03-27 NOTE — Assessment & Plan Note (Signed)
Chronic Regular exercise and healthy diet encouraged Check lipid panel  Continue rosuvastatin 5 mg twice weekly Stressed smoking cessation and keeping alcohol to a minimum

## 2021-03-27 NOTE — Assessment & Plan Note (Addendum)
Chronic Continue rosuvastatin 5 mg twice weekly, Tricor 145 mg daily Check lipid panel She is active and walking Encouraged smoking cessation

## 2021-03-27 NOTE — Assessment & Plan Note (Signed)
She currently states she is not drinking on a daily basis and not drinking that much

## 2021-03-28 ENCOUNTER — Telehealth: Payer: Medicare Other

## 2021-03-28 NOTE — Progress Notes (Deleted)
° °Chronic Care Management °Pharmacy Note ° °03/28/2021 °Name:  [name] MRN:  [id number] DOB:  04/09/1956 ° °Summary: °- patient completed repeat BMP yesterday, Scr remains slightly elevated but improved at 1.24 mg/dL, eGFR remains decreased at 45.98mL/min, but CrCl has improved to 57 mL/min °-Patient reports that she is doing well, continuing to work toward smoking cessation ° °Recommendations/Changes made from today's visit: °-as BMP shows that CrCl has increased and is now >50mL/min - recommending for patient to return to gabapentin 600mg 3 times daily, patient voiced understanding of plan, will continue to remain well hydrated throughout the day °-Patient agreeable to continue to work toward smoking cessation, trying to have a quit date in place with next visit  ° °Subjective: °[name] is an 65 y.o. year old female who is a primary patient of Burns, Stacy J, MD.  The CCM team was consulted for assistance with disease management and care coordination needs.   ° °Engaged with patient by telephone for follow up visit in response to provider referral for pharmacy case management and/or care coordination services.  ° °Consent to Services:  °The patient was given the following information about Chronic Care Management services today, agreed to services, and gave verbal consent: 1. CCM service includes personalized support from designated clinical staff supervised by the primary care provider, including individualized plan of care and coordination with other care providers 2. 24/7 contact phone numbers for assistance for urgent and routine care needs. 3. Service will only be billed when office clinical staff spend 20 minutes or more in a month to coordinate care. 4. Only one practitioner may furnish and bill the service in a calendar month. 5.The patient may stop CCM services at any time (effective at the end of the month) by phone call to the office staff. 6. The patient will be  responsible for cost sharing (co-pay) of up to 20% of the service fee (after annual deductible is met). Patient agreed to services and consent obtained. ° °Patient Care Team: °Burns, Stacy J, MD as PCP - General (Internal Medicine) °Amundson C Silva, Brook E, MD (Obstetrics and Gynecology) °Zeigler, Jason C, DPM (Podiatry) °Gorsuch, Ni, MD as Consulting Physician (Hematology and Oncology) °Thomas, Alicia, MD (General Surgery) °Armbruster, Steven P, MD as Consulting Physician (Gastroenterology) °,  C, RPH as Pharmacist (Pharmacist) ° °Recent office visits: °03/27/2021 - Dr. Burns - pain in left buttock - referred to sports medicine, continue crestor twice weekly - stressed smoking cessation  ° °Recent consult visits: °None since last visit  ° °Hospital visits: °None since last visit  ° °Objective: ° °Lab Results  °Component Value Date  ° CREATININE 1.27 (H) 03/27/2021  ° BUN 28 (H) 03/27/2021  ° GFR 44.60 (L) 03/27/2021  ° GFRNONAA 41 (L) 11/10/2020  ° GFRAA >60 01/20/2015  ° NA 138 03/27/2021  ° K 4.2 03/27/2021  ° CALCIUM 9.7 03/27/2021  ° CO2 24 03/27/2021  ° GLUCOSE 100 (H) 03/27/2021  ° ° °Lab Results  °Component Value Date/Time  ° HGBA1C 6.6 (H) 03/27/2021 11:22 AM  ° HGBA1C 6.8 (H) 06/13/2020 08:16 AM  ° GFR 44.60 (L) 03/27/2021 11:22 AM  ° GFR 45.98 (L) 12/26/2020 12:23 PM  ° MICROALBUR 11.0 (H) 12/01/2016 08:47 AM  °  °Last diabetic Eye exam:  °No results found for: HMDIABEYEEXA  °Last diabetic Foot exam:  °No results found for: HMDIABFOOTEX  ° °Lab Results  °Component Value Date  ° CHOL 142 03/27/2021  ° HDL 49.90 03/27/2021  ° LDLCALC 68   68 03/27/2021   LDLDIRECT 51.0 12/13/2018   TRIG 119.0 03/27/2021   CHOLHDL 3 03/27/2021    Hepatic Function Latest Ref Rng & Units 03/27/2021 11/10/2020 06/13/2020  Total Protein 6.0 - 8.3 g/dL 7.1 7.6 7.1  Albumin 3.5 - 5.2 g/dL 4.3 4.4 4.2  AST 0 - 37 U/L _0 ALT 0 - 35 U/L _1 Alk Phosphatase 39 - 117 U/L 69 57 62  Total Bilirubin 0.2 -  1.2 mg/dL 0.4 0.6 0.4  Bilirubin, Direct 0.0 - 0.3 mg/dL - - -    Lab Results  Component Value Date/Time   TSH 0.82 10/09/2015 04:45 PM   TSH 0.87 05/23/2014 09:27 AM    CBC Latest Ref Rng & Units 03/27/2021 11/10/2020 06/13/2020  WBC 4.0 - 10.5 K/uL 7.7 9.5 8.2  Hemoglobin 12.0 - 15.0 g/dL 12.6 12.7 13.5  Hematocrit 36.0 - 46.0 % 39.3 39.6 41.0  Platelets 150.0 - 400.0 K/uL 322.0 351 341.0    No results found for: VD25OH  Clinical ASCVD: No  The 10-year ASCVD risk score (Arnett DK, et al., 2019) is: 23.1%   Values used to calculate the score:     Age: 40 years     Sex: Female     Is Non-Hispanic African American: Yes     Diabetic: Yes     Tobacco smoker: Yes     Systolic Blood Pressure: 258 mmHg     Is BP treated: Yes     HDL Cholesterol: 49.9 mg/dL     Total Cholesterol: 142 mg/dL    Depression screen [facility] 2/9 03/22/2021 10/03/2020 01/02/2020  Decreased Interest 0 0 0  Down, Depressed, Hopeless 0 0 1  PHQ - 2 Score 0 0 1  Altered sleeping - - -  Tired, decreased energy - - -  Change in appetite - - -  Feeling bad or failure about yourself  - - -  Trouble concentrating - - -  Moving slowly or fidgety/restless - - -  Suicidal thoughts - - -  PHQ-9 Score - - -  Difficult doing work/chores - - -  Some recent data might be hidden     Social History   Tobacco Use  Smoking Status Every Day   Packs/day: 0.25   Years: 40.00   Pack years: 10.00   Types: Cigarettes  Smokeless Tobacco Never  Tobacco Comments   Pt. in project free program, motivated to stop smoking completely. Declined smoking cessation cone program.   BP Readings from Last 3 Encounters:  03/27/21 118/68  11/10/20 (!) 91/55  10/03/20 122/68   Pulse Readings from Last 3 Encounters:  03/27/21 65  11/10/20 65  10/03/20 73   Wt Readings from Last 3 Encounters:  03/27/21 168 lb (76.2 kg)  11/10/20 175 lb (79.4 kg)  10/03/20 169 lb (76.7 kg)   BMI Readings from Last 3 Encounters:  03/27/21 27.12  kg/m  11/10/20 28.25 kg/m  10/03/20 27.28 kg/m    Assessment/Interventions: Review of patient past medical history, allergies, medications, health status, including review of consultants reports, laboratory and other test data, was performed as part of comprehensive evaluation and provision of chronic care management services.   SDOH:  (Social Determinants of Health) assessments and interventions performed: Yes  SDOH Screenings   Alcohol Screen: Low Risk    Last Alcohol Screening Score (AUDIT): 1  Depression (PHQ2-9): Low Risk    PHQ-2 Score: 0  Financial Resource Strain: Low Risk    Difficulty  of Paying Living Expenses: Not hard at all  Food Insecurity: No Food Insecurity   Worried About Charity fundraiser in the Last Year: Never true   Ran Out of Food in the Last Year: Never true  Housing: Low Risk    Last Housing Risk Score: 0  Physical Activity: Insufficiently Active   Days of Exercise per Week: 3 days   Minutes of Exercise per Session: 30 min  Social Connections: Moderately Isolated   Frequency of Communication with Friends and Family: Twice a week   Frequency of Social Gatherings with Friends and Family: Twice a week   Attends Religious Services: More than 4 times per year   Active Member of [employer] or Organizations: No   Attends Music therapist: Never   Marital Status: Divorced  Stress: No Stress Concern Present   Feeling of Stress : Not at all  Tobacco Use: High Risk   Smoking Tobacco Use: Every Day   Smokeless Tobacco Use: Never   Passive Exposure: Not on file  Transportation Needs: No Transportation Needs   Lack of Transportation (Medical): No   Lack of Transportation (Non-Medical): No    CCM Care Plan  Allergies  Allergen Reactions   Ampicillin Hives   Hydrocodone Itching    Can take Oxycodone and Codeine   Penicillins Other (See Comments)    Unknown reaction   Grapeseed Extract [Nutritional Supplements] Rash    Medications Reviewed  Today     Reviewed by Binnie Rail, MD (Physician) on 03/27/21 at [facility] Status: <None>   Medication Order Taking? Sig Documenting Provider Last Dose Status Informant  ALPRAZolam (XANAX) 0.5 MG tablet [id number] Yes TAKE 1 TABLET BY MOUTH  TWICE DAILY AS NEEDED FOR  ANXIETY Burns, Claudina Lick, MD Taking Active   Blood Glucose Monitoring Suppl (ACCU-CHEK GUIDE) w/Device KIT [id number] Yes USE TO CHECK BLOOD SUGAR  ONCE DAILY AND AS NEEDED Binnie Rail, MD Taking Active   colchicine 0.6 MG tablet [id number] Yes TAKE 2 TABLETS BY MOUTH FOR ONE DOSE AND THEN 1 TABLET  BY MOUTH 1 HOUR LATER FOR  GOUT FLARE Binnie Rail, MD Taking Active   fenofibrate (TRICOR) 145 MG tablet [id number] Yes TAKE 1 TABLET BY MOUTH ONCE DAILY Burns, Claudina Lick, MD Taking Active   gabapentin (NEURONTIN) 600 MG tablet [id number] Yes TAKE 1 TABLET BY MOUTH 3  TIMES DAILY Burns, Claudina Lick, MD Taking Active   glucose blood (FREESTYLE LITE) test strip [id number] Yes Use as instructed Binnie Rail, MD Taking Active   hydrochlorothiazide (HYDRODIURIL) 25 MG tablet [id number] Yes TAKE 1 TABLET BY MOUTH  DAILY Burns, Claudina Lick, MD Taking Active   Lancets [facility] ULTRASOFT) lancets [id number] Yes Use as directed to check sugars once daily and as needed.  Dx O11.57 Binnie Rail, MD Taking Active   lisinopril (ZESTRIL) 10 MG tablet [id number] Yes TAKE 1 TABLET BY MOUTH  DAILY Burns, Claudina Lick, MD Taking Active   metFORMIN (GLUCOPHAGE) 500 MG tablet [id number] Yes TAKE 1 TABLET BY MOUTH  TWICE DAILY WITH MEALS Burns, Claudina Lick, MD Taking Active   rosuvastatin (CRESTOR) 5 MG tablet [id number] Yes TAKE 1 TABLET BY MOUTH  TWICE WEEKLY Binnie Rail, MD Taking Active             Patient Active Problem List   Diagnosis Date Noted   Abdominal wall hernia 03/27/2021   Pain in left buttock 03/27/2021   History of gout 02/15/2020  Alcohol abuse    Hypertriglyceridemia 06/13/2019   Aortic atherosclerosis ([place]) 06/13/2019   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place [facility]) 12/13/2018   B12 deficiency 06/15/2016   Anxiety 06/12/2016   Type 2 diabetes mellitus with diabetic autonomic neuropathy, without long-term current use of insulin ([place]) 01/10/2016   Peristomal hernia 04/19/2012   Seborrheic dermatitis 08/09/2010   PERSONAL HX RECTAL CANCER 06/05/2010   Tobacco dependence due to cigarettes 04/16/2010   ALLERGIC RHINITIS 04/16/2010   Peripheral neuropathy ([place]) 12/06/2009   Essential hypertension 12/06/2009   Diarrhea 12/06/2009    Immunization History  Administered Date(s) Administered   Influenza Whole 01/02/2020   Influenza,inj,Quad PF,6+ Mos 03/18/2013, 05/23/2014, 01/10/2016, 04/03/2017, 12/09/2017, 12/13/2018, 03/27/2021   PFIZER(Purple Top)SARS-COV-2 Vaccination 07/07/2019, 07/28/2019   Pneumococcal Conjugate-13 05/23/2014   Pneumococcal Polysaccharide-23 06/12/2016   Td 05/01/2010    Conditions to be addressed/monitored:  Hypertension, Hyperlipidemia, Diabetes, Anxiety, Gout and peripheral neuropathy   There are no care plans that you recently modified to display for this patient.      Medication Assistance: None required.  Patient affirms current coverage meets needs.  Patient's preferred pharmacy is:  [place] ([place]), [place] - [address] [place] ([place]) [place] [address] Phone: [phone] Fax: [phone]  [facility] ([place], [place] [place] [address] Phone: [phone] Fax: [phone]  [facility] ([facility] ) - [place], [place] [address] Phone: [phone] Fax: [phone]   Uses pill box? No - reports that she turns bottle over after she takes in the morning, turns over at the end of the day  Pt endorses 100% compliance  Care Plan and Follow Up Patient Decision:  Patient agrees  to Care Plan and Follow-up.  Plan: Telephone follow up appointment with care management team member scheduled for:  3 months  and The patient has been provided with contact information for the care management team and has been advised to call with any health related questions or concerns.   Tomasa Blase, PharmD Clinical Pharmacist, [place]

## 2021-04-03 NOTE — Progress Notes (Signed)
[name] D.[name] Phone: [phone]   Assessment and Plan:     1. Piriformis syndrome, left 2. Pain in left buttock - Acute, uncomplicated, initial sports medicine visit - Likely left-sided sciatica due to piriformis syndrome based on HPI, physical exam, lack of MOI - Start HEP for sciatica and piriformis syndrome - Start meloxicam 15 mg daily x2 weeks and then discontinue - Start Flexeril 5 mg nightly for muscle spasms   Pertinent previous records reviewed include PCP note from 04/03/2021   Follow Up: 3-week follow-up for reevaluation.  If no improvement or worsening of symptoms could consider hip versus lumbar spine x-ray, formal PT   Subjective:   I, [name], am serving as a Education administrator for [name]  Chief Complaint: left buttock pain   HPI:   04/04/2021 Patient is a 65 year old female complaining of left buttock pain. Patient states  she has pain under her left buttock for about 2 weeks she also states that she has been lifting her paralyzed significant other but does not remember a specific  MOI and also has a hernia on that left side does radiate down the back of the leg, the pain aches with movement has neuropathy   Relevant Historical Information: DM type II, history of rectal cancer with colostomy bag  Additional pertinent review of systems negative.   Current Outpatient Medications:    ALPRAZolam (XANAX) 0.5 MG tablet, TAKE 1 TABLET BY MOUTH  TWICE DAILY AS NEEDED FOR  ANXIETY, Disp: 90 tablet, Rfl: 0   Blood Glucose Monitoring Suppl (ACCU-CHEK GUIDE) w/Device KIT, USE TO CHECK BLOOD SUGAR  ONCE DAILY AND AS NEEDED, Disp: 1 kit, Rfl: 2   colchicine 0.6 MG tablet, TAKE 2 TABLETS BY MOUTH FOR ONE DOSE AND THEN 1 TABLET  BY MOUTH 1 HOUR LATER FOR  GOUT FLARE, Disp: 21 tablet, Rfl: 3   cyclobenzaprine (FLEXERIL) 5 MG tablet, Take 1 tablet (5 mg total) by mouth at  bedtime., Disp: 15 tablet, Rfl: 0   fenofibrate (TRICOR) 145 MG tablet, TAKE 1 TABLET BY MOUTH ONCE DAILY, Disp: 90 tablet, Rfl: 3   gabapentin (NEURONTIN) 600 MG tablet, TAKE 1 TABLET BY MOUTH 3  TIMES DAILY, Disp: 270 tablet, Rfl: 3   glucose blood (FREESTYLE LITE) test strip, Use as instructed, Disp: 100 each, Rfl: 12   hydrochlorothiazide (HYDRODIURIL) 25 MG tablet, TAKE 1 TABLET BY MOUTH  DAILY, Disp: 90 tablet, Rfl: 3   Lancets (ONETOUCH ULTRASOFT) lancets, Use as directed to check sugars once daily and as needed.  Dx E11.43, Disp: 100 each, Rfl: 12   lisinopril (ZESTRIL) 10 MG tablet, TAKE 1 TABLET BY MOUTH  DAILY, Disp: 90 tablet, Rfl: 3   meloxicam (MOBIC) 15 MG tablet, Take 1 tablet (15 mg total) by mouth daily., Disp: 30 tablet, Rfl: 0   metFORMIN (GLUCOPHAGE) 500 MG tablet, TAKE 1 TABLET BY MOUTH  TWICE DAILY WITH MEALS, Disp: 180 tablet, Rfl: 3   rosuvastatin (CRESTOR) 5 MG tablet, TAKE 1 TABLET BY MOUTH  TWICE WEEKLY, Disp: 26 tablet, Rfl: 3   Objective:     Vitals:   04/04/21 1345  BP: 100/60  Pulse: 79  SpO2: 97%  Weight: 169 lb (76.7 kg)  Height: _0  (1.676 m)      Body mass index is 27.28 kg/m.    Physical Exam:    General: awake, alert, and oriented no acute distress, nontoxic  Skin: no suspicious lesions or rashes Neuro:sensation intact distally with no dificits, normal muscle tone, no atrophy, strength 5/5 in all tested lower ext groups Psych: normal mood and affect, speech clear  Left hip: No deformity, swelling or wasting ROM Fexion 90, ext 20, IR 20, ER 35 TTP gluteal musculature NTTP over the hip flexors, greater troch,  si joint, lumbar spine Negative log roll with FROM Negative FABER  positive FADIR with gluteal pain Positive piriformis test   Gait slowed favoring right leg   Electronically signed by:  [name] D.[facility] 2:09 PM 04/04/21

## 2021-04-04 ENCOUNTER — Encounter: Payer: Self-pay | Admitting: Sports Medicine

## 2021-04-04 ENCOUNTER — Telehealth: Payer: Self-pay | Admitting: Internal Medicine

## 2021-04-04 ENCOUNTER — Other Ambulatory Visit: Payer: Self-pay

## 2021-04-04 ENCOUNTER — Ambulatory Visit (INDEPENDENT_AMBULATORY_CARE_PROVIDER_SITE_OTHER): Payer: Medicare Other | Admitting: Sports Medicine

## 2021-04-04 VITALS — BP 100/60 | HR 79 | Ht 66.0 in | Wt 169.0 lb

## 2021-04-04 DIAGNOSIS — G5702 Lesion of sciatic nerve, left lower limb: Secondary | ICD-10-CM | POA: Diagnosis not present

## 2021-04-04 DIAGNOSIS — L84 Corns and callosities: Secondary | ICD-10-CM

## 2021-04-04 DIAGNOSIS — B351 Tinea unguium: Secondary | ICD-10-CM

## 2021-04-04 DIAGNOSIS — E1143 Type 2 diabetes mellitus with diabetic autonomic (poly)neuropathy: Secondary | ICD-10-CM | POA: Diagnosis not present

## 2021-04-04 DIAGNOSIS — M7918 Myalgia, other site: Secondary | ICD-10-CM | POA: Diagnosis not present

## 2021-04-04 DIAGNOSIS — M79674 Pain in right toe(s): Secondary | ICD-10-CM | POA: Diagnosis not present

## 2021-04-04 DIAGNOSIS — M79675 Pain in left toe(s): Secondary | ICD-10-CM | POA: Diagnosis not present

## 2021-04-04 MED ORDER — CYCLOBENZAPRINE HCL 5 MG PO TABS: 5.0000 mg | ORAL_TABLET | Freq: Every day | ORAL | 0 refills | Status: DC

## 2021-04-04 MED ORDER — MELOXICAM 15 MG PO TABS: 15.0000 mg | ORAL_TABLET | Freq: Every day | ORAL | 0 refills | Status: DC

## 2021-04-04 NOTE — Telephone Encounter (Signed)
Patient requesting a call back

## 2021-04-04 NOTE — Patient Instructions (Addendum)
Good to see you Meloxicam 15 mg 2 weeks daily and then discontinue  Flexeril 5 mg at night as needed Piriformis and sciatica  HEP 3 week follow up

## 2021-04-04 NOTE — Progress Notes (Signed)
Subjective: [name] is a 65 y.o. female patient with history of diabetes who presents to office today complaining of long,mildly painful nails  while ambulating in shoes; unable to trim. Patient states that the glucose reading this morning was not recorded. No other issues noted.   Patient Active Problem List   Diagnosis Date Noted   Abdominal wall hernia 03/27/2021   Pain in left buttock 03/27/2021   History of gout 02/15/2020   Alcohol abuse    Hypertriglyceridemia 06/13/2019   Aortic atherosclerosis ([place]) 06/13/2019   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place [facility]) 12/13/2018   B12 deficiency 06/15/2016   Anxiety 06/12/2016   Type 2 diabetes mellitus with diabetic autonomic neuropathy, without long-term current use of insulin ([place]) 01/10/2016   Peristomal hernia 04/19/2012   Seborrheic dermatitis 08/09/2010   PERSONAL HX RECTAL CANCER 06/05/2010   Tobacco dependence due to cigarettes 04/16/2010   ALLERGIC RHINITIS 04/16/2010   Peripheral neuropathy ([place]) 12/06/2009   Essential hypertension 12/06/2009   Diarrhea 12/06/2009   Current Outpatient Medications on File Prior to Visit  Medication Sig Dispense Refill   ALPRAZolam (XANAX) 0.5 MG tablet TAKE 1 TABLET BY MOUTH  TWICE DAILY AS NEEDED FOR  ANXIETY 90 tablet 0   Blood Glucose Monitoring Suppl (ACCU-CHEK GUIDE) w/Device KIT USE TO CHECK BLOOD SUGAR  ONCE DAILY AND AS NEEDED 1 kit 2   colchicine 0.6 MG tablet TAKE 2 TABLETS BY MOUTH FOR ONE DOSE AND THEN 1 TABLET  BY MOUTH 1 HOUR LATER FOR  GOUT FLARE 21 tablet 3   fenofibrate (TRICOR) 145 MG tablet TAKE 1 TABLET BY MOUTH ONCE DAILY 90 tablet 3   gabapentin (NEURONTIN) 600 MG tablet TAKE 1 TABLET BY MOUTH 3  TIMES DAILY 270 tablet 3   glucose blood (FREESTYLE LITE) test strip Use as instructed 100 each 12   hydrochlorothiazide (HYDRODIURIL) 25 MG tablet TAKE 1 TABLET BY MOUTH  DAILY 90 tablet 3   Lancets (ONETOUCH ULTRASOFT) lancets Use as directed to check sugars once daily and as  needed.  Dx E11.43 100 each 12   lisinopril (ZESTRIL) 10 MG tablet TAKE 1 TABLET BY MOUTH  DAILY 90 tablet 3   metFORMIN (GLUCOPHAGE) 500 MG tablet TAKE 1 TABLET BY MOUTH  TWICE DAILY WITH MEALS 180 tablet 3   rosuvastatin (CRESTOR) 5 MG tablet TAKE 1 TABLET BY MOUTH  TWICE WEEKLY 26 tablet 3   No current facility-administered medications on file prior to visit.   Allergies  Allergen Reactions   Ampicillin Hives   Hydrocodone Itching    Can take Oxycodone and Codeine   Penicillins Other (See Comments)    Unknown reaction   Grapeseed Extract [Nutritional Supplements] Rash    Recent Results (from the past 2160 hour(s))  Uric acid     Status: None   Collection Time: 03/27/21 11:22 AM  Result Value Ref Range   Uric Acid, Serum 6.7 2.4 - 7.0 mg/dL  CBC with Differential/Platelet     Status: None   Collection Time: 03/27/21 11:22 AM  Result Value Ref Range   WBC 7.7 4.0 - 10.5 K/uL   RBC 4.31 3.87 - 5.11 Mil/uL   Hemoglobin 12.6 12.0 - 15.0 g/dL   HCT 39.3 36.0 - 46.0 %   MCV 91.2 78.0 - 100.0 fl   MCHC 31.9 30.0 - 36.0 g/dL   RDW 14.5 11.5 - 15.5 %   Platelets 322.0 150.0 - 400.0 K/uL   Neutrophils Relative % 65.5 43.0 - 77.0 %   Lymphocytes  Relative 25.1 12.0 - 46.0 %   Monocytes Relative 7.5 3.0 - 12.0 %   Eosinophils Relative 1.4 0.0 - 5.0 %   Basophils Relative 0.5 0.0 - 3.0 %   Neutro Abs 5.0 1.4 - 7.7 K/uL   Lymphs Abs 1.9 0.7 - 4.0 K/uL   Monocytes Absolute 0.6 0.1 - 1.0 K/uL   Eosinophils Absolute 0.1 0.0 - 0.7 K/uL   Basophils Absolute 0.0 0.0 - 0.1 K/uL  Vitamin B12     Status: Abnormal   Collection Time: 03/27/21 11:22 AM  Result Value Ref Range   Vitamin B-12 209 (L) 211 - 911 pg/mL  Lipid panel     Status: None   Collection Time: 03/27/21 11:22 AM  Result Value Ref Range   Cholesterol 142 0 - 200 mg/dL    Comment: ATP III Classification       Desirable:  < 200 mg/dL               Borderline High:  200 - 239 mg/dL          High:  > = 240 mg/dL    Triglycerides 119.0 0.0 - 149.0 mg/dL    Comment: Normal:  <150 mg/dLBorderline High:  150 - 199 mg/dL   HDL 49.90 >39.00 mg/dL   VLDL 23.8 0.0 - 40.0 mg/dL   LDL Cholesterol 68 0 - 99 mg/dL   Total CHOL/HDL Ratio 3     Comment:                Men          Women1/2 Average Risk     3.4          3.3Average Risk          5.0          4.42X Average Risk          9.6          7.13X Average Risk          15.0          11.0                       NonHDL 92.03     Comment: NOTE:  Non-HDL goal should be 30 mg/dL higher than patient's LDL goal (i.e. LDL goal of < 70 mg/dL, would have non-HDL goal of < 100 mg/dL)  Hemoglobin A1c     Status: Abnormal   Collection Time: 03/27/21 11:22 AM  Result Value Ref Range   Hgb A1c MFr Bld 6.6 (H) 4.6 - 6.5 %    Comment: Glycemic Control Guidelines for People with Diabetes:Non Diabetic:  <6%Goal of Therapy: <7%Additional Action Suggested:  >8%   Comprehensive metabolic panel     Status: Abnormal   Collection Time: 03/27/21 11:22 AM  Result Value Ref Range   Sodium 138 135 - 145 mEq/L   Potassium 4.2 3.5 - 5.1 mEq/L   Chloride 106 96 - 112 mEq/L   CO2 24 19 - 32 mEq/L   Glucose, Bld 100 (H) 70 - 99 mg/dL   BUN 28 (H) 6 - 23 mg/dL   Creatinine, Ser 1.27 (H) 0.40 - 1.20 mg/dL   Total Bilirubin 0.4 0.2 - 1.2 mg/dL   Alkaline Phosphatase 69 39 - 117 U/L   AST 17 0 - 37 U/L   ALT 11 0 - 35 U/L   Total Protein 7.1 6.0 - 8.3 g/dL   Albumin 4.3 3.5 -  5.2 g/dL   GFR 44.60 (L) >60.00 mL/min    Comment: Calculated using the CKD-EPI Creatinine Equation (2021)   Calcium 9.7 8.4 - 10.5 mg/dL    Objective: General: Patient is awake, alert, and oriented x 3 and in no acute distress.  Integument: Skin is warm, dry and supple bilateral. Nails are tender, long, thickened and  dystrophic with subungual debris, consistent with onychomycosis, 1-5 bilateral. Diffuse callus plantar forefoot. No signs of infection. No open lesions or preulcerative lesions present bilateral.  Remaining integument unremarkable.  Vasculature:  Dorsalis Pedis pulse 1/4 bilateral. Posterior Tibial pulse  1/4 bilateral.  Capillary fill time <3 sec 1-5 bilateral. Positive hair growth to the level of the digits. Temperature gradient within normal limits. No varicosities present bilateral. No edema present bilateral.   Neurology: Gross sensation via light touch bilateral.   Musculoskeletal: Asymptomatic bunion and hammertoe pedal deformities noted bilateral. Muscular strength 5/5 in all lower extremity muscular groups bilateral without pain on range of motion . No tenderness with calf compression bilateral.  Assessment and Plan: Problem List Items Addressed This Visit       Endocrine   Type 2 diabetes mellitus with diabetic autonomic neuropathy, without long-term current use of insulin ([place])   Other Visit Diagnoses     Pain due to onychomycosis of toenails of both feet    -  Primary   Callus of foot           -Examined patient. -Discussed and educated patient on diabetic foot care, especially with  regards to the vascular, neurological and musculoskeletal systems.  -Stressed the importance of good glycemic control and the detriment of not  controlling glucose levels in relation to the foot. -Mechanically debrided all nails 1-5 bilateral using sterile nail nipper and filed with dremel without incident  -Mechanically debrided callus plantar forefoot bilateral -Answered all patient questions -Patient to return  in 3 months for at risk foot care -Patient advised to call the office if any problems or questions arise in the meantime.  Landis Martins, DPM

## 2021-04-04 NOTE — Telephone Encounter (Signed)
Left message, patient to return call / call office back to reschedule previously missed appointment

## 2021-04-06 IMAGING — MG DIGITAL SCREENING BILAT W/ TOMO W/ CAD
8 series · 8 of 24 positions shown · non-contrast
Comparison: Previous exam(s).

CLINICAL DATA: Screening.

EXAM:
DIGITAL SCREENING BILATERAL MAMMOGRAM WITH TOMO AND CAD

[L CC synth-2D]
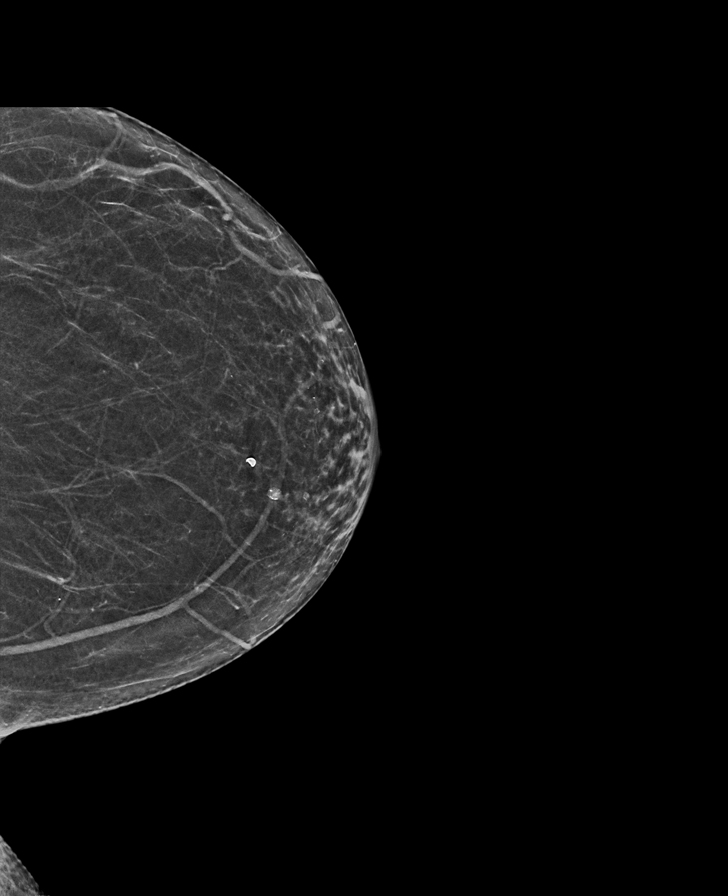

[L MLO synth-2D]
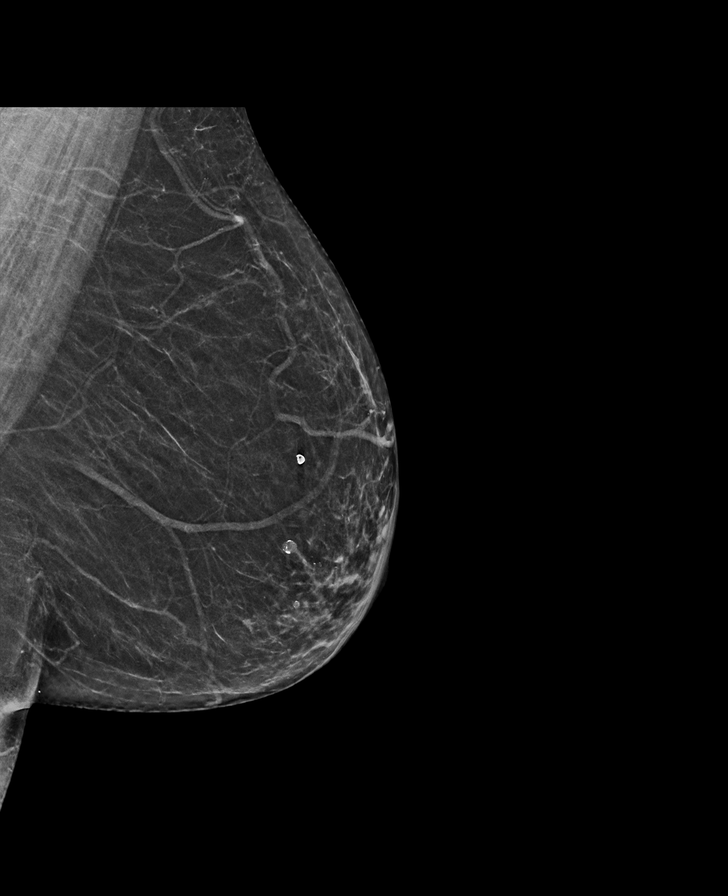

[R CC synth-2D]
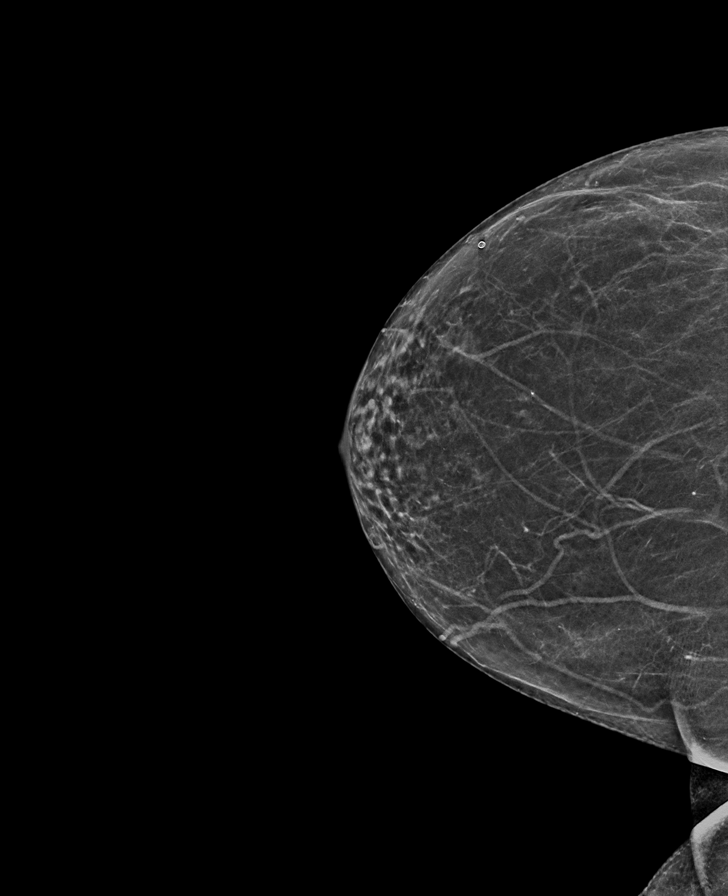

[R MLO synth-2D]
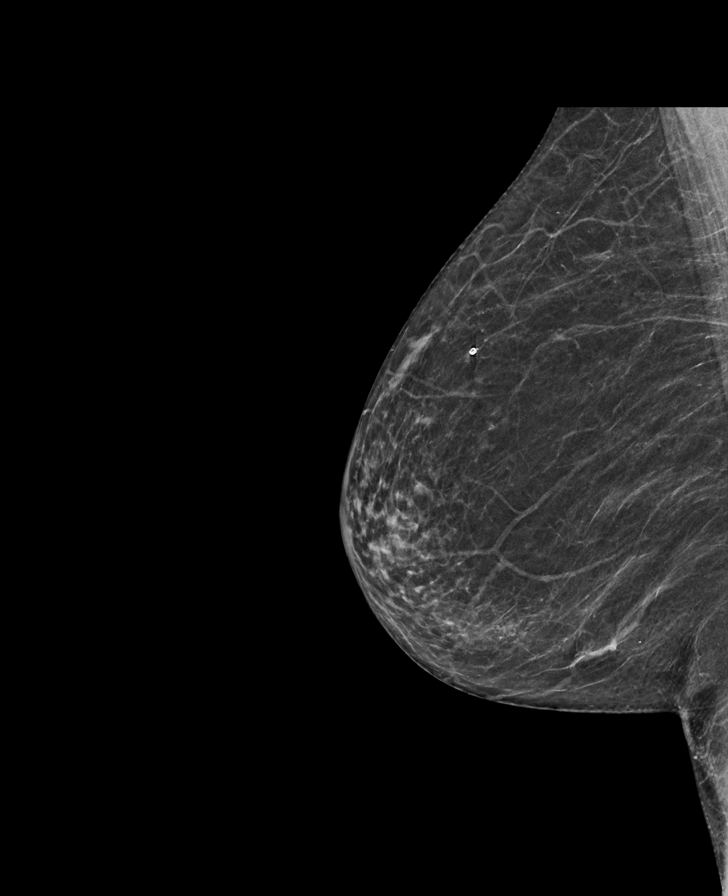

[L CC tomo · tomo slice 28/55.0]
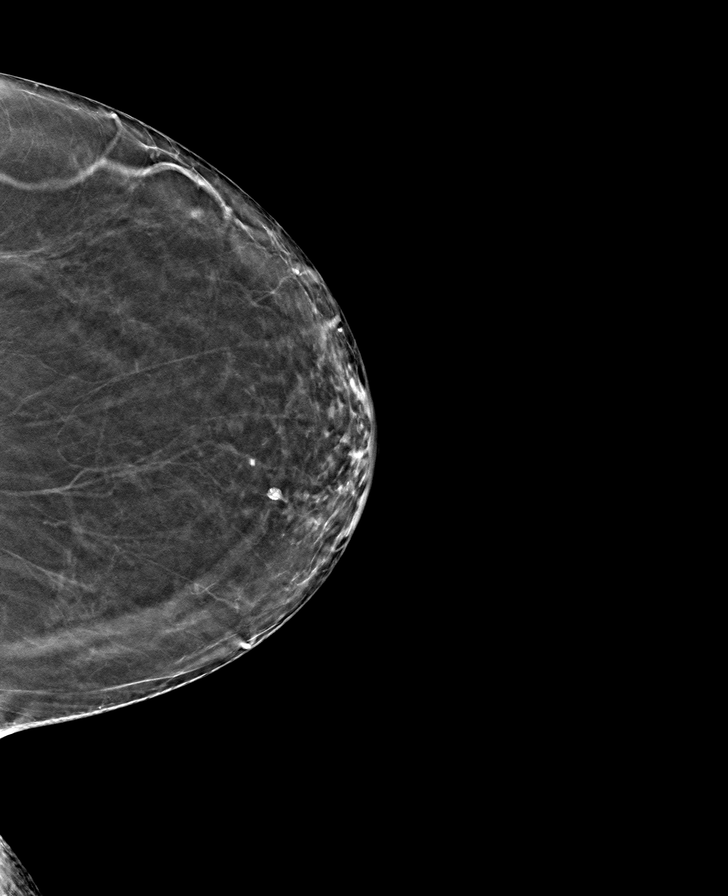

[R MLO tomo · tomo slice 29/57.0]
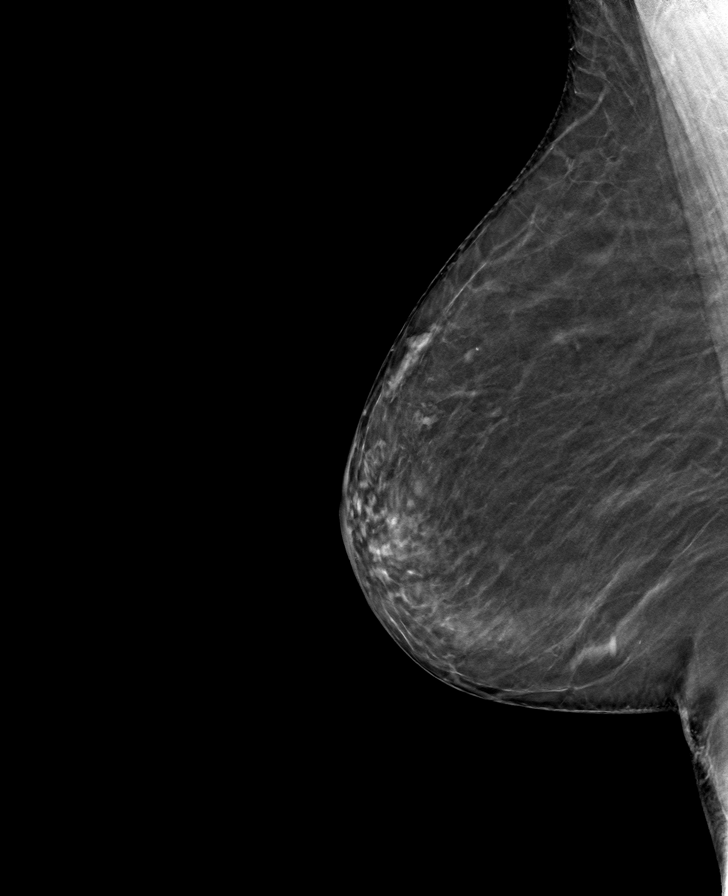

[L MLO tomo · tomo slice 30/59.0]
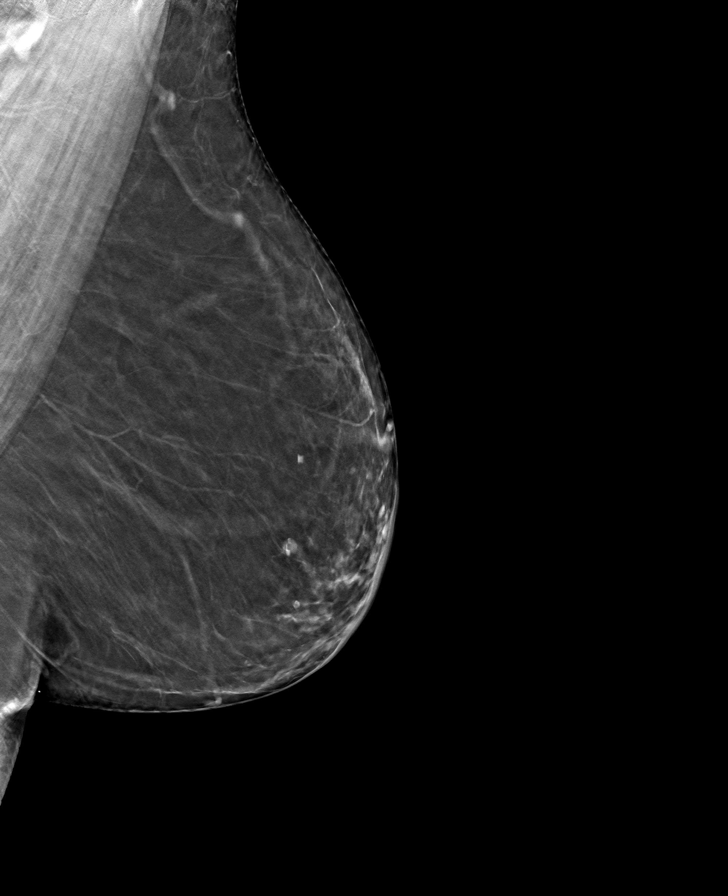

[R CC tomo · tomo slice 29/57.0]
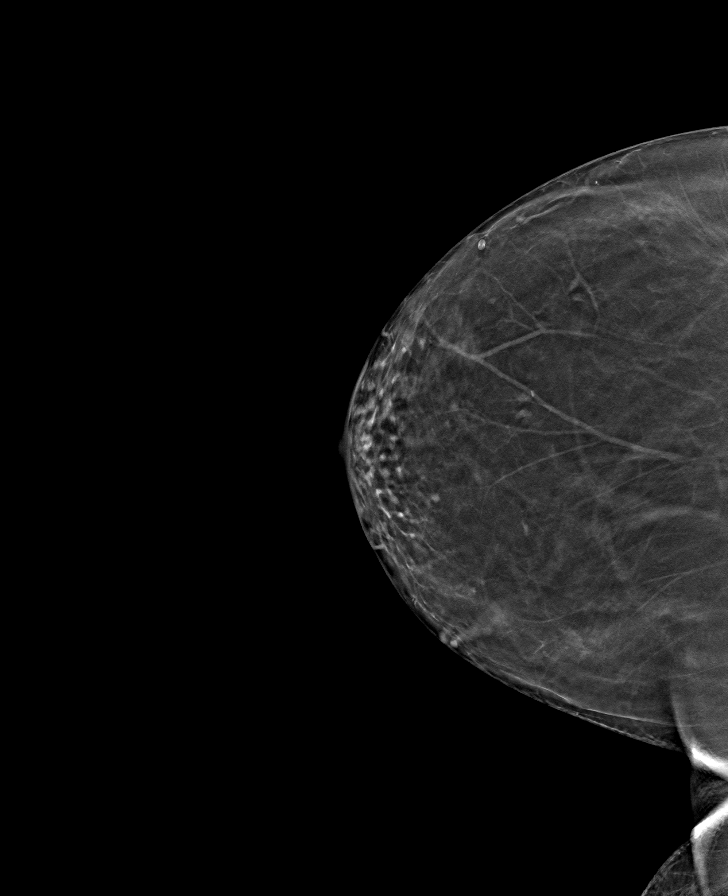

[8 of 24 positions shown; findings below may reference images not displayed]

ACR Breast Density Category b: There are scattered areas of
fibroglandular density.
FINDINGS: There are no findings suspicious for malignancy. Images were
processed with CAD.
IMPRESSION: No mammographic evidence of malignancy. A result letter of this
screening mammogram will be mailed directly to the patient.

RECOMMENDATION:
Screening mammogram in one year. (Code:[id number])

BI-RADS CATEGORY  1: Negative.

## 2021-04-12 ENCOUNTER — Telehealth: Payer: Self-pay | Admitting: Internal Medicine

## 2021-04-12 ENCOUNTER — Ambulatory Visit (INDEPENDENT_AMBULATORY_CARE_PROVIDER_SITE_OTHER): Payer: Medicare Other

## 2021-04-12 DIAGNOSIS — E1143 Type 2 diabetes mellitus with diabetic autonomic (poly)neuropathy: Secondary | ICD-10-CM

## 2021-04-12 DIAGNOSIS — I10 Essential (primary) hypertension: Secondary | ICD-10-CM

## 2021-04-12 DIAGNOSIS — F17210 Nicotine dependence, cigarettes, uncomplicated: Secondary | ICD-10-CM

## 2021-04-12 NOTE — Patient Instructions (Signed)
Visit Information  Following are the goals we discussed today:   Track and Manage My Blood Pressure   Timeframe:  Long-Range Goal Priority:  High Start Date:  09/20/2020                           Expected End Date:  03/28/2022                     Follow Up Date 07/11/2021   - check blood pressure weekly    Why is this important?   You won't feel high blood pressure, but it can still hurt your blood vessels.  High blood pressure can cause heart or kidney problems. It can also cause a stroke.  Making lifestyle changes like losing a little weight or eating less salt will help.  Checking your blood pressure at home and at different times of the day can help to control blood pressure.  If the doctor prescribes medicine remember to take it the way the doctor ordered.  Call the office if you cannot afford the medicine or if there are questions about it.     Notes: Patient to reach out to clinic with blood pressures that are averaging above goal range (<130/80)  Track and Manage My Blood Sugars   Timeframe:  Long-Range Goal Priority:  High Start Date:  09/20/2020                           Expected End Date:  03/28/2022                     Follow Up Date 07/11/2021   - check blood sugar at prescribed times - check blood sugar if I feel it is too high or too low - enter blood sugar readings and medication or insulin into daily log - take the blood sugar log to all doctor visits    Why is this important?   Checking your blood sugar at home helps to keep it from getting very high or very low.  Writing the results in a diary or log helps the doctor know how to care for you.  Your blood sugar log should have the time, date and the results.  Also, write down the amount of insulin or other medicine that you take.  Other information, like what you ate, exercise done and how you were feeling, will also be helpful.     Notes: Patient to reach out should blood sugars elevate out of goal range  (>150) or should she experience more frequent hypoglycemic episodes   Plan: Telephone follow up appointment with care management team member scheduled for:  3 months  The patient has been provided with contact information for the care management team and has been advised to call with any health related questions or concerns.   Tomasa Blase, PharmD Clinical Pharmacist, [facility]   Please call the care guide team at [phone] if you need to cancel or reschedule your appointment.   The patient verbalized understanding of instructions, educational materials, and care plan provided today and declined offer to receive copy of patient instructions, educational materials, and care plan.

## 2021-04-12 NOTE — Telephone Encounter (Signed)
Patient requesting a call back from pharmacist regarding her medications

## 2021-04-12 NOTE — Telephone Encounter (Signed)
Patient checking status of lab results  Informed patient of provider's 03-28-2021 lab result notes and recommendations  Patient understood provider's notes and recommendations, patient does not have any questions at this time  Patient is scheduled for a b12 injection on 04-17-2021

## 2021-04-12 NOTE — Progress Notes (Signed)
Chronic Care Management Pharmacy Note  04/12/2021 Name:  [name] MRN:  [id number] DOB:  05-13-1955  Summary: -Reports that she has been having sciatica in her left buttocks - was given 2 week prescriptions of meloxicam and cyclobenzaprine by sports medicine - has been helpful at reducing pain levels and keeping pain controlled  -New Years resolution is to quit smoking - has a friend that she plans to quit with -BG and BP within goal, no issues with hypotension / hypoglycemia  Recommendations/Changes made from today's visit: -A1c improved remains at goal, LDL at goal as well, kidney function remains stable  -Recommending no changes to medications, advised to use NSAIDs for shortest amount of time possible in setting of decreased kidney function -Encouraged patient to reach out to clinic with any medication questions or concerns   Subjective: [name] is an 65 y.o. year old female who is a primary patient of Burns, Claudina Lick, MD.  The CCM team was consulted for assistance with disease management and care coordination needs.    Engaged with patient by telephone for follow up visit in response to provider referral for pharmacy case management and/or care coordination services.   Consent to Services:  The patient was given the following information about Chronic Care Management services today, agreed to services, and gave verbal consent: 1. CCM service includes personalized support from designated clinical staff supervised by the primary care provider, including individualized plan of care and coordination with other care providers 2. 24/7 contact phone numbers for assistance for urgent and routine care needs. 3. Service will only be billed when office clinical staff spend 20 minutes or more in a month to coordinate care. 4. Only one practitioner may furnish and bill the service in a calendar month. 5.The patient may stop CCM services at any time (effective at the end  of the month) by phone call to the office staff. 6. The patient will be responsible for cost sharing (co-pay) of up to 20% of the service fee (after annual deductible is met). Patient agreed to services and consent obtained.  Patient Care Team: Binnie Rail, MD as PCP - General (Internal Medicine) Nunzio Cobbs, MD (Obstetrics and Gynecology) Jana Half, DPM (Podiatry) Heath Lark, MD as Consulting Physician (Hematology and Oncology) Leighton Ruff, MD (General Surgery) Armbruster, Carlota Raspberry, MD as Consulting Physician (Gastroenterology) Delice Bison Darnelle Maffucci, [facility] as Pharmacist (Pharmacist)  Recent office visits: 03/27/2021 - Dr. Quay Burow - pain in left buttock - referred to sports medicine, continue crestor twice weekly - stressed smoking cessation   Recent consult visits: 04/04/2021 - Dr. Glennon Mac -sports medicine - started cyclobenzaprine and meloxicam  04/04/2021 - Dr. Cannon Kettle - Podiatry - routine  Hospital visits: None since last visit   Objective:  Lab Results  Component Value Date   CREATININE 1.27 (H) 03/27/2021   BUN 28 (H) 03/27/2021   GFR 44.60 (L) 03/27/2021   GFRNONAA 41 (L) 11/10/2020   GFRAA >60 01/20/2015   NA 138 03/27/2021   K 4.2 03/27/2021   CALCIUM 9.7 03/27/2021   CO2 24 03/27/2021   GLUCOSE 100 (H) 03/27/2021    Lab Results  Component Value Date/Time   HGBA1C 6.6 (H) 03/27/2021 11:22 AM   HGBA1C 6.8 (H) 06/13/2020 08:16 AM   GFR 44.60 (L) 03/27/2021 11:22 AM   GFR 45.98 (L) 12/26/2020 12:23 PM   MICROALBUR 11.0 (H) 12/01/2016 08:47 AM    Last diabetic Eye exam:  No results found for: HMDIABEYEEXA  Last diabetic Foot exam:  No results found for: HMDIABFOOTEX   Lab Results  Component Value Date   CHOL 142 03/27/2021   HDL 49.90 03/27/2021   LDLCALC 68 03/27/2021   LDLDIRECT 51.0 12/13/2018   TRIG 119.0 03/27/2021   CHOLHDL 3 03/27/2021    Hepatic Function Latest Ref Rng & Units 03/27/2021 11/10/2020 06/13/2020  Total Protein 6.0  - 8.3 g/dL 7.1 7.6 7.1  Albumin 3.5 - 5.2 g/dL 4.3 4.4 4.2  AST 0 - 37 U/L _0 ALT 0 - 35 U/L _1 Alk Phosphatase 39 - 117 U/L 69 57 62  Total Bilirubin 0.2 - 1.2 mg/dL 0.4 0.6 0.4  Bilirubin, Direct 0.0 - 0.3 mg/dL - - -    Lab Results  Component Value Date/Time   TSH 0.82 10/09/2015 04:45 PM   TSH 0.87 05/23/2014 09:27 AM    CBC Latest Ref Rng & Units 03/27/2021 11/10/2020 06/13/2020  WBC 4.0 - 10.5 K/uL 7.7 9.5 8.2  Hemoglobin 12.0 - 15.0 g/dL 12.6 12.7 13.5  Hematocrit 36.0 - 46.0 % 39.3 39.6 41.0  Platelets 150.0 - 400.0 K/uL 322.0 351 341.0    No results found for: VD25OH  Clinical ASCVD: No  The 10-year ASCVD risk score (Arnett DK, et al., 2019) is: 15.8%   Values used to calculate the score:     Age: 88 years     Sex: Female     Is Non-Hispanic African American: Yes     Diabetic: Yes     Tobacco smoker: Yes     Systolic Blood Pressure: 711 mmHg     Is BP treated: Yes     HDL Cholesterol: 49.9 mg/dL     Total Cholesterol: 142 mg/dL    Depression screen [facility] 2/9 03/22/2021 10/03/2020 01/02/2020  Decreased Interest 0 0 0  Down, Depressed, Hopeless 0 0 1  PHQ - 2 Score 0 0 1  Altered sleeping - - -  Tired, decreased energy - - -  Change in appetite - - -  Feeling bad or failure about yourself  - - -  Trouble concentrating - - -  Moving slowly or fidgety/restless - - -  Suicidal thoughts - - -  PHQ-9 Score - - -  Difficult doing work/chores - - -  Some recent data might be hidden     Social History   Tobacco Use  Smoking Status Every Day   Packs/day: 0.25   Years: 40.00   Pack years: 10.00   Types: Cigarettes  Smokeless Tobacco Never  Tobacco Comments   Pt. in project free program, motivated to stop smoking completely. Declined smoking cessation cone program.   BP Readings from Last 3 Encounters:  04/04/21 100/60  03/27/21 118/68  11/10/20 (!) 91/55   Pulse Readings from Last 3 Encounters:  04/04/21 79  03/27/21 65  11/10/20 65   Wt  Readings from Last 3 Encounters:  04/04/21 169 lb (76.7 kg)  03/27/21 168 lb (76.2 kg)  11/10/20 175 lb (79.4 kg)   BMI Readings from Last 3 Encounters:  04/04/21 27.28 kg/m  03/27/21 27.12 kg/m  11/10/20 28.25 kg/m    Assessment/Interventions: Review of patient past medical history, allergies, medications, health status, including review of consultants reports, laboratory and other test data, was performed as part of comprehensive evaluation and provision of chronic care management services.   SDOH:  (Social Determinants of Health) assessments and interventions performed: Yes  SDOH Screenings   Alcohol Screen: Low  Risk    Last Alcohol Screening Score (AUDIT): 1  Depression (PHQ2-9): Low Risk    PHQ-2 Score: 0  Financial Resource Strain: Low Risk    Difficulty of Paying Living Expenses: Not hard at all  Food Insecurity: No Food Insecurity   Worried About Charity fundraiser in the Last Year: Never true   Ran Out of Food in the Last Year: Never true  Housing: Low Risk    Last Housing Risk Score: 0  Physical Activity: Insufficiently Active   Days of Exercise per Week: 3 days   Minutes of Exercise per Session: 30 min  Social Connections: Moderately Isolated   Frequency of Communication with Friends and Family: Twice a week   Frequency of Social Gatherings with Friends and Family: Twice a week   Attends Religious Services: More than 4 times per year   Active Member of [employer] or Organizations: No   Attends Music therapist: Never   Marital Status: Divorced  Stress: No Stress Concern Present   Feeling of Stress : Not at all  Tobacco Use: High Risk   Smoking Tobacco Use: Every Day   Smokeless Tobacco Use: Never   Passive Exposure: Not on file  Transportation Needs: No Transportation Needs   Lack of Transportation (Medical): No   Lack of Transportation (Non-Medical): No    CCM Care Plan  Allergies  Allergen Reactions   Ampicillin Hives   Hydrocodone  Itching    Can take Oxycodone and Codeine   Penicillins Other (See Comments)    Unknown reaction   Grapeseed Extract [Nutritional Supplements] Rash    Medications Reviewed Today     Reviewed by Tomasa Blase, [facility] (Pharmacist) on 04/12/21 at 1153  Med List Status: <None>   Medication Order Taking? Sig Documenting Provider Last Dose Status Informant  ALPRAZolam (XANAX) 0.5 MG tablet [id number] No TAKE 1 TABLET BY MOUTH  TWICE DAILY AS NEEDED FOR  ANXIETY Burns, Claudina Lick, MD Taking Active   Blood Glucose Monitoring Suppl (ACCU-CHEK GUIDE) w/Device KIT [id number] No USE TO CHECK BLOOD SUGAR  ONCE DAILY AND AS NEEDED Binnie Rail, MD Taking Active   colchicine 0.6 MG tablet [id number] No TAKE 2 TABLETS BY MOUTH FOR ONE DOSE AND THEN 1 TABLET  BY MOUTH 1 HOUR LATER FOR  GOUT FLARE Burns, Claudina Lick, MD Taking Active   cyclobenzaprine (FLEXERIL) 5 MG tablet [id number]  Take 1 tablet (5 mg total) by mouth at bedtime. Glennon Mac, DO  Active   fenofibrate (TRICOR) 145 MG tablet [id number] No TAKE 1 TABLET BY MOUTH ONCE DAILY Burns, Claudina Lick, MD Taking Active   gabapentin (NEURONTIN) 600 MG tablet [id number] No TAKE 1 TABLET BY MOUTH 3  TIMES DAILY Burns, Claudina Lick, MD Taking Active   glucose blood (FREESTYLE LITE) test strip [id number] No Use as instructed Binnie Rail, MD Taking Active   hydrochlorothiazide (HYDRODIURIL) 25 MG tablet [id number] No TAKE 1 TABLET BY MOUTH  DAILY Burns, Claudina Lick, MD Taking Active   Lancets [facility] ULTRASOFT) lancets [id number] No Use as directed to check sugars once daily and as needed.  Dx H21.22 Binnie Rail, MD Taking Active   lisinopril (ZESTRIL) 10 MG tablet [id number] No TAKE 1 TABLET BY MOUTH  DAILY Burns, Claudina Lick, MD Taking Active   meloxicam (MOBIC) 15 MG tablet [id number]  Take 1 tablet (15 mg total) by mouth daily. 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   B12 deficiency 06/15/2016   Anxiety 06/12/2016   Type 2 diabetes mellitus with diabetic autonomic neuropathy, without long-term current use of insulin ([place]) 01/10/2016   Peristomal hernia 04/19/2012   Seborrheic dermatitis 08/09/2010   PERSONAL HX RECTAL CANCER 06/05/2010   Tobacco dependence due to cigarettes 04/16/2010   ALLERGIC RHINITIS 04/16/2010   Peripheral neuropathy ([place]) 12/06/2009   Essential hypertension 12/06/2009   Diarrhea 12/06/2009    Immunization History  Administered Date(s) Administered   Influenza Whole 01/02/2020   Influenza,inj,Quad PF,6+ Mos 03/18/2013, 05/23/2014, 01/10/2016, 04/03/2017, 12/09/2017, 12/13/2018, 03/27/2021   PFIZER(Purple Top)SARS-COV-2 Vaccination 07/07/2019, 07/28/2019   Pneumococcal Conjugate-13 05/23/2014   Pneumococcal Polysaccharide-23 06/12/2016   Td 05/01/2010    Conditions to be addressed/monitored:  Hypertension, Hyperlipidemia, Diabetes, Anxiety, Gout and peripheral neuropathy   Care Plan : CCM Care Plan  Updates made by Tomasa Blase, [place] since 04/12/2021 12:00 AM     Problem: HTN, HLD, DM2, Gout, Anxiety, Neuropathy, Smoking Cessation   Priority: High     Long-Range Goal: Disease Management   Start Date: 09/20/2020  Expected End Date: 04/12/2022  This Visit's Progress: On track  Recent Progress: On track  Priority: High  Note:   Current Barriers:  Unable to independently monitor therapeutic efficacy  Pharmacist Clinical Goal(s):  Patient will achieve adherence to monitoring  guidelines and medication adherence to achieve therapeutic efficacy maintain control of BP, Blood sugars, and LDL as evidenced by BP and blood sugar logs / next lipid panel  Smoking cessation through collaboration with PharmD and provider.   Interventions: 1:1 collaboration with Binnie Rail, MD regarding development and update of comprehensive plan of care as evidenced by provider attestation and co-signature Inter-disciplinary care team collaboration (see longitudinal plan of care) Comprehensive medication review performed; medication list updated in electronic medical record  Hypertension (BP goal <130/80) -Controlled -Current treatment: Hydrochlorothiazide 32m daily  Lisinopril 1781mdaily  -Medications previously tried: amlodipine  -Current home readings: n/a - has been controlled with most recent office visits  BP Readings from Last 3 Encounters:  04/04/21 100/60  03/27/21 118/68  11/10/20 (!) 91/55  -Current dietary habits: eats a sodium reduced diet -Current exercise habits: limited  -Denies hypotensive/hypertensive symptoms -Educated on BP goals and benefits of medications for prevention of heart attack, stroke and kidney damage; Daily salt intake goal < 2300 mg; Importance of home blood pressure monitoring; Symptoms of hypotension and importance of maintaining adequate hydration; -Counseled to monitor BP at home daily as she has been doing, document, and provide log at future appointments -Counseled on diet and exercise extensively Recommended to continue current medication  Hyperlipidemia: (LDL goal < 70) -Controlled  Lab Results  Component Value Date   LDLCALC 68 03/27/2021  -Current treatment: Rosuvastatin 81m51m- taking twice weekly  Fenofibrate 1481m10mily  -Medications previously tried: n/a  -Current dietary patterns: avoids fried fatty foods, has reduced her red meat  intake, trying to watch diet closer than she previously had been to reduce LDL and blood  sugars -Current exercise habits: limited  -Educated on Cholesterol goals;  Benefits of statin for ASCVD risk reduction; Importance of limiting foods high in cholesterol; -Counseled on diet and exercise extensively Recommended to continue current medication  Diabetes (A1c goal <7%) -Controlled  Lab Results  Component Value Date   HGBA1C 6.6 (H) 03/27/2021  -Last GFR 44.60 mL/min  -Current medications: Metformin 525m twice daily  -Medications previously tried: checking 2-3 times weekly   -Current home glucose readings fasting glucose: 95-134 -Reports hypoglycemic/hyperglycemic symptoms -Current meal patterns:  breakfast: egg whites +/- toast, grits - at times does not eat   lunch: may not eat / may be her breakfast   dinner: homemade, meat, vegetable, and a small amount of starch/ carb snacks: chips drinks: water, on rare occasion Kool-Aid, coffee w/ creamer  -Current exercise: limited  -Educated on A1c and blood sugar goals; Complications of diabetes including kidney damage, retinal damage, and cardiovascular disease; Prevention and management of hypoglycemic episodes; Benefits of routine self-monitoring of blood sugar; -Counseled to check feet daily and get yearly eye exams -Counseled on diet and exercise extensively Recommended to continue current medication  Anxiety (Goal: Prevention of Anxiety Attack / Mood Control ) -Not ideally controlled per patient better when taking alprazolam -Current treatment: Alprazolam 0.565mtwice daily as needed for anxiety (reports that she has been taking since 2012) -Medications previously tried/failed: wellbutrin, citalopram, duloxetine, elavil  -GAD7: 13 - patient notes that she feels her screening is a major improvement since starting medication -Educated on Benefits of medication for symptom control Benefits of cognitive-behavioral therapy with or without medication -Recommended to continue current medication Educated on using  alprazolam no more than as prescribed, advised patient to use only as needed and to not take on a scheduled basis   Gout (Goal: Prevention of gout attacks / treatment of acute attacks ) -Controlled -Current treatment  Colchicine 0.8m8m 2 tablets x 1 dose then 1 tablet 1 hour later if needed  -Medications previously tried: n/a  -Recommended to continue current medication Counseled on diet, avoiding foods that can exacerbate / lead to acute flares    Peripheral Neuropathy (Goal: Pain control ) -Not ideally controlled  - Last estimated CrCl =  53.23 mL/min  -Current treatment  Gabapentin 600m33m3 tablets daily   -Medications previously tried: duloxetine, lyrica  -Recommended for patient to continue current medication at this time  Tobacco use (Goal Smoking Cessation) -Not ideally controlled - patient plans to stop smoking as her New Year's Resolution - has a friend that is going to quit with her as well -Previous quit attempts: Had stopped smoking for 13 days previously while admitted after surgery also had quit 4-6 months [place] to [place] due to [place] from [place]  -Current tobacco usage  1 pack can last her about >1 month- depending on stress levels can go 2-3 days at a time without smoking a cigarette - not smoking full cigarette when she does smoke Notes to vape usage  - 1 cartridge lasts for about 1 month -Patient smokes After 30 minutes of waking -Patient triggers include: stress and anxiety, finishing a meal, and going to a social event and seeing someone else smoke -On a scale of 1-10, reports MOTIVATION to quit is 6-7 -On a scale of 1-10, reports CONFIDENCE in quitting is 8-9 -Provided contact information for [facility] (1-800-QUIT-NOW) and encouraged patient  to reach out to this group for support.  -Recommended for patient to establish a quit date before next appointment (3 months)   Patient Goals/Self-Care Activities Patient will:  - take medications as  prescribed check glucose daily, document, and provide at future appointments check blood pressure daily, document, and provide at future appointments Establish smoking quit date before next appointment  / start her plan to stop smoking   Follow Up Plan: Telephone follow up appointment with care management team member scheduled for: 3 months The patient has been provided with contact information for the care management team and has been advised to call with any health related questions or concerns.           Medication Assistance: None required.  Patient affirms current coverage meets needs.  Patient's preferred pharmacy is:  [facility] ([place]), [place] - [address] [place] [address] Phone: [phone] Fax: [phone]  [facility] ([place], [address] [place] [address] [place] [address] Phone: [phone] Fax: [phone]  [facility] ([facility] ) - [place], [place] [address] Phone: [phone] Fax: [phone]    Uses pill box? No - reports that she turns bottle over after she takes in the morning, turns over at the end of the day  Pt endorses 100% compliance  Care Plan and Follow Up Patient Decision:  Patient agrees to Care Plan and Follow-up.  Plan: Telephone follow up appointment with care management team member scheduled for:  3 months  and The patient has been provided with contact information for the care management team and has been advised to call with any health related questions or concerns.   Tomasa Blase, PharmD Clinical Pharmacist, [place]

## 2021-04-13 DIAGNOSIS — I10 Essential (primary) hypertension: Secondary | ICD-10-CM

## 2021-04-13 DIAGNOSIS — Z933 Colostomy status: Secondary | ICD-10-CM | POA: Diagnosis not present

## 2021-04-13 DIAGNOSIS — E1143 Type 2 diabetes mellitus with diabetic autonomic (poly)neuropathy: Secondary | ICD-10-CM

## 2021-04-16 ENCOUNTER — Other Ambulatory Visit: Payer: Self-pay | Admitting: Internal Medicine

## 2021-04-16 DIAGNOSIS — F419 Anxiety disorder, unspecified: Secondary | ICD-10-CM

## 2021-04-17 ENCOUNTER — Ambulatory Visit: Payer: Medicare Other

## 2021-04-25 ENCOUNTER — Ambulatory Visit (INDEPENDENT_AMBULATORY_CARE_PROVIDER_SITE_OTHER): Payer: Medicare Other

## 2021-04-25 ENCOUNTER — Other Ambulatory Visit: Payer: Self-pay

## 2021-04-25 DIAGNOSIS — E538 Deficiency of other specified B group vitamins: Secondary | ICD-10-CM

## 2021-04-25 MED ORDER — CYANOCOBALAMIN 1000 MCG/ML IJ SOLN
1000.0000 ug | Freq: Once | INTRAMUSCULAR | Status: AC
  Administered 2021-04-25: 1000 ug via INTRAMUSCULAR

## 2021-04-25 NOTE — Progress Notes (Addendum)
Pt here for monthly B12 injection per Burns  B12 1013mcg given IM, and pt tolerated injection well.  Medical screening examination/treatment/procedure(s) were performed by non-physician practitioner and as supervising physician I was immediately available for consultation/collaboration.  I agree with above. Lew Dawes, MD

## 2021-04-27 ENCOUNTER — Other Ambulatory Visit: Payer: Self-pay | Admitting: Internal Medicine

## 2021-05-22 ENCOUNTER — Other Ambulatory Visit: Payer: Self-pay | Admitting: Internal Medicine

## 2021-05-27 ENCOUNTER — Ambulatory Visit: Payer: Medicare Other

## 2021-05-29 DIAGNOSIS — Z933 Colostomy status: Secondary | ICD-10-CM | POA: Diagnosis not present

## 2021-06-17 NOTE — Progress Notes (Deleted)
? ?   [name] [name] ?[facility] ?[place] ?Phone: [phone] ?  ?Assessment and Plan:   ?  ?There are no diagnoses linked to this encounter.  ?*** ?  ?Pertinent previous records reviewed include *** ?  ?Follow Up: ***  ? ?  ?Subjective:   ?I, Pincus Badder, am serving as a Education administrator for [employer] ? ?Chief Complaint: left side sciatic pain  ? ?HPI:  ?04/04/2021 ?Patient is a 66 year old female complaining of left buttock pain. Patient states  she has pain under her left buttock for about 2 weeks she also states that she has been lifting her paralyzed significant other but does not remember a specific  MOI and also has a hernia on that left side does radiate down the back of the leg, the pain aches with movement has neuropathy  ?  ?06/18/2021 ?Patient states ? ? ?Relevant Historical Information: DM type II, history of rectal cancer with colostomy bag ? ? ?Additional pertinent review of systems negative. ? ? ?Current Outpatient Medications:  ?  ALPRAZolam (XANAX) 0.5 MG tablet, TAKE 1 TABLET BY MOUTH TWICE  DAILY AS NEEDED FOR ANXIETY, Disp: 180 tablet, Rfl: 0 ?  Blood Glucose Monitoring Suppl (ACCU-CHEK GUIDE) w/Device KIT, USE TO CHECK BLOOD SUGAR  ONCE DAILY AND AS NEEDED, Disp: 1 kit, Rfl: 2 ?  colchicine 0.6 MG tablet, TAKE 2 TABLETS BY MOUTH FOR ONE DOSE AND THEN 1 TABLET  BY MOUTH 1 HOUR LATER FOR  GOUT FLARE, Disp: 21 tablet, Rfl: 3 ?  cyclobenzaprine (FLEXERIL) 5 MG tablet, Take 1 tablet (5 mg total) by mouth at bedtime., Disp: 15 tablet, Rfl: 0 ?  fenofibrate (TRICOR) 145 MG tablet, TAKE 1 TABLET BY MOUTH ONCE DAILY, Disp: 90 tablet, Rfl: 3 ?  gabapentin (NEURONTIN) 600 MG tablet, TAKE 1 TABLET BY MOUTH 3  TIMES DAILY, Disp: 270 tablet, Rfl: 3 ?  glucose blood (FREESTYLE LITE) test strip, Use as instructed, Disp: 100 each, Rfl: 12 ?  hydrochlorothiazide (HYDRODIURIL) 25 MG tablet, TAKE 1 TABLET BY MOUTH  DAILY, Disp: 90 tablet, Rfl:  3 ?  Lancets (ONETOUCH ULTRASOFT) lancets, Use as directed to check sugars once daily and as needed.  Dx E11.43, Disp: 100 each, Rfl: 12 ?  lisinopril (ZESTRIL) 10 MG tablet, TAKE 1 TABLET BY MOUTH  DAILY, Disp: 90 tablet, Rfl: 3 ?  meloxicam (MOBIC) 15 MG tablet, Take 1 tablet (15 mg total) by mouth daily., Disp: 30 tablet, Rfl: 0 ?  metFORMIN (GLUCOPHAGE) 500 MG tablet, TAKE 1 TABLET BY MOUTH  TWICE DAILY WITH MEALS, Disp: 180 tablet, Rfl: 3 ?  rosuvastatin (CRESTOR) 5 MG tablet, TAKE 1 TABLET BY MOUTH  TWICE WEEKLY, Disp: 26 tablet, Rfl: 3  ? ?Objective:   ?  ?There were no vitals filed for this visit.  ?  ?There is no height or weight on file to calculate BMI.  ?  ?Physical Exam:   ? ?*** ? ? ?Electronically signed by:  ?[name] [name] ?[facility] ?12:56 PM 06/17/21 ?

## 2021-06-18 ENCOUNTER — Ambulatory Visit: Payer: Medicare Other | Admitting: Sports Medicine

## 2021-07-01 NOTE — Progress Notes (Addendum)
? ? Benito Mccreedy D.Merril Abbe ?[facility] ?[place] ?Phone: [phone] ?  ?Assessment and Plan:   ?  ?1. Pain in left buttock ?2. Piriformis syndrome, left ?3. Low back pain, unspecified back pain laterality, unspecified chronicity, unspecified whether sciatica present ?-Chronic with exacerbation, subsequent visit ?- Improvement after HEP and course of NSAIDs in 03/2021, with recurrence over the past 1 month ?- X-ray obtained in clinic.  My interpretation: No acute fracture, vertebral collapse, dislocation.  Moderate lumbar DDD no significant L4-L5.  Decreased bilateral hip joint space with mild femoral and acetabular cortical changes ?--Restart meloxicam 15 mg daily x2 weeks.  If still having pain after 2 weeks, complete 3rd-week of meloxicam. May use remaining meloxicam as needed once daily for pain control.  Do not to use additional NSAIDs while taking meloxicam.  May use Tylenol [phone] mg 2  times a day for breakthrough pain. ?- After completing course of meloxicam.  Start Tylenol 501,000 mg 2 times a day for day-to-day pain relief ?- Continue HEP ?- DG Lumbar Spine 2-3 Views; Future ?- DG Pelvis 1-2 Views; Future  ?  ?Pertinent previous records reviewed include none ?  ?Follow Up: 4 weeks for reevaluation.  Could consider advanced imaging or potential epidurals if no improvement or worsening symptoms at that time.  Could also consider formal PT ?  ?Subjective:   ?I, [name], am serving as a Education administrator for [employer] ? ?Chief Complaint: sciatic pain  ? ?HPI:  ?04/04/2021 ?Patient is a 66 year old female complaining of left buttock pain. Patient states  she has pain under her left buttock for about 2 weeks she also states that she has been lifting her paralyzed significant other but does not remember a specific  MOI and also has a hernia on that left side does radiate down the back of the leg, the pain aches with movement has neuropathy  ?   ?07/02/2021 ?Patient states the pain is now underneath her left buttock and across her low back but and down her side, does state she does a lot of walking, the meloxicam worked but the flexeril was no help at all, thought she was getting better but had a flare up about  month ago ? ? ?Relevant Historical Information: DM type II, history of rectal cancer with colostomy bag ? ?Additional pertinent review of systems negative. ? ? ?Current Outpatient Medications:  ?  ALPRAZolam (XANAX) 0.5 MG tablet, TAKE 1 TABLET BY MOUTH TWICE  DAILY AS NEEDED FOR ANXIETY, Disp: 180 tablet, Rfl: 0 ?  Blood Glucose Monitoring Suppl (ACCU-CHEK GUIDE) w/Device KIT, USE TO CHECK BLOOD SUGAR  ONCE DAILY AND AS NEEDED, Disp: 1 kit, Rfl: 2 ?  colchicine 0.6 MG tablet, TAKE 2 TABLETS BY MOUTH FOR ONE DOSE AND THEN 1 TABLET  BY MOUTH 1 HOUR LATER FOR  GOUT FLARE, Disp: 21 tablet, Rfl: 3 ?  cyclobenzaprine (FLEXERIL) 5 MG tablet, Take 1 tablet (5 mg total) by mouth at bedtime., Disp: 15 tablet, Rfl: 0 ?  fenofibrate (TRICOR) 145 MG tablet, TAKE 1 TABLET BY MOUTH ONCE DAILY, Disp: 90 tablet, Rfl: 3 ?  gabapentin (NEURONTIN) 600 MG tablet, TAKE 1 TABLET BY MOUTH 3  TIMES DAILY, Disp: 270 tablet, Rfl: 3 ?  glucose blood (FREESTYLE LITE) test strip, Use as instructed, Disp: 100 each, Rfl: 12 ?  hydrochlorothiazide (HYDRODIURIL) 25 MG tablet, TAKE 1 TABLET BY MOUTH  DAILY, Disp: 90 tablet, Rfl: 3 ?  Lancets (ONETOUCH ULTRASOFT) lancets, Use as directed to check sugars once daily and as needed.  Dx E11.43, Disp: 100 each, Rfl: 12 ?  lisinopril (ZESTRIL) 10 MG tablet, TAKE 1 TABLET BY MOUTH  DAILY, Disp: 90 tablet, Rfl: 3 ?  meloxicam (MOBIC) 15 MG tablet, Take 1 tablet (15 mg total) by mouth daily., Disp: 30 tablet, Rfl: 0 ?  metFORMIN (GLUCOPHAGE) 500 MG tablet, TAKE 1 TABLET BY MOUTH  TWICE DAILY WITH MEALS, Disp: 180 tablet, Rfl: 3 ?  rosuvastatin (CRESTOR) 5 MG tablet, TAKE 1 TABLET BY MOUTH  TWICE WEEKLY, Disp: 26 tablet, Rfl: 3  ? ?Objective:    ?  ?Vitals:  ? 07/02/21 0833  ?BP: 122/82  ?Pulse: 78  ?SpO2: 99%  ?Weight: 163 lb (73.9 kg)  ?Height: '5\' 6"'  (1.676 m)  ?  ?  ?Body mass index is 26.31 kg/m?.  ?  ?Physical Exam:   ? ?Gen: Appears well, nad, nontoxic and pleasant ?Psych: Alert and oriented, appropriate mood and affect ?Neuro: sensation intact, strength is 5/5 in upper and lower extremities, muscle tone wnl ?Skin: no susupicious lesions or rashes ? ?Back - Normal skin, Spine with normal alignment and no deformity.   ?No tenderness to vertebral process palpation.   ?Paraspinous muscles are mildly tender and without spasm ?Straight leg raise negative ?   ?Left hip: ?No deformity, swelling or wasting ?ROM Flexion 90, ext 30, IR 45, ER 45 ?TTP gluteal musculature, lumbar spine ?NTTP over the hip flexors, greater troch,  si joint,   ?Negative log roll with FROM ?Negative FABER ?Negative FADIR ?Negative Piriformis test ?  ?Gait normal  ? ? ?Electronically signed by:  ?Benito Mccreedy D.Merril Abbe ?[facility] ?9:03 AM 07/02/21 ?

## 2021-07-02 ENCOUNTER — Ambulatory Visit (INDEPENDENT_AMBULATORY_CARE_PROVIDER_SITE_OTHER): Payer: Medicare Other

## 2021-07-02 ENCOUNTER — Other Ambulatory Visit: Payer: Self-pay

## 2021-07-02 ENCOUNTER — Ambulatory Visit (INDEPENDENT_AMBULATORY_CARE_PROVIDER_SITE_OTHER): Payer: Medicare Other | Admitting: Sports Medicine

## 2021-07-02 VITALS — BP 122/82 | HR 78 | Ht 66.0 in | Wt 163.0 lb

## 2021-07-02 DIAGNOSIS — M545 Low back pain, unspecified: Secondary | ICD-10-CM

## 2021-07-02 DIAGNOSIS — M7918 Myalgia, other site: Secondary | ICD-10-CM | POA: Diagnosis not present

## 2021-07-02 DIAGNOSIS — G5702 Lesion of sciatic nerve, left lower limb: Secondary | ICD-10-CM | POA: Diagnosis not present

## 2021-07-02 DIAGNOSIS — M47816 Spondylosis without myelopathy or radiculopathy, lumbar region: Secondary | ICD-10-CM | POA: Diagnosis not present

## 2021-07-02 MED ORDER — MELOXICAM 15 MG PO TABS: 15.0000 mg | ORAL_TABLET | Freq: Every day | ORAL | 0 refills | Status: DC

## 2021-07-02 NOTE — Patient Instructions (Addendum)
Good to see you  ?Start meloxicam 15 mg daily x2 weeks.  If still having pain after 2 weeks, complete 3rd-week of meloxicam. May use remaining meloxicam as needed once daily for pain control.  Do not to use additional NSAIDs while taking meloxicam.  May use Tylenol [phone] mg 2 times a day for breakthrough pain. ?Continue HEP  ?4 week follow up  ?

## 2021-07-04 ENCOUNTER — Ambulatory Visit: Payer: Medicare Other | Admitting: Sports Medicine

## 2021-07-05 ENCOUNTER — Other Ambulatory Visit: Payer: Self-pay | Admitting: Internal Medicine

## 2021-07-05 DIAGNOSIS — F419 Anxiety disorder, unspecified: Secondary | ICD-10-CM

## 2021-07-10 ENCOUNTER — Ambulatory Visit (INDEPENDENT_AMBULATORY_CARE_PROVIDER_SITE_OTHER): Payer: Medicare Other

## 2021-07-10 DIAGNOSIS — E781 Pure hyperglyceridemia: Secondary | ICD-10-CM

## 2021-07-10 DIAGNOSIS — I700 Atherosclerosis of aorta: Secondary | ICD-10-CM

## 2021-07-10 DIAGNOSIS — E1143 Type 2 diabetes mellitus with diabetic autonomic (poly)neuropathy: Secondary | ICD-10-CM

## 2021-07-10 DIAGNOSIS — I10 Essential (primary) hypertension: Secondary | ICD-10-CM

## 2021-07-10 NOTE — Progress Notes (Signed)
? ?Chronic Care Management ?Pharmacy Note ? ?07/10/2021 ?Name:  [name] MRN:  [id number] DOB:  Jun 25, 1955 ? ?Summary: ?-Continues to have issues with back pain - was given 2 week supply of meloxicam from sports medicine, has been helpful since starting  ?-Reports that BG at home averaging 90-130 - last A1c in range  ?-Has not been checking BP at home, in office has been controlled, denies any issues  ? ?Recommendations/Changes made from today's visit: ?-A1c improved remains at goal, LDL at goal as well, kidney function remains stable  ?-Recommending no changes to medications, advised to use NSAIDs for shortest amount of time possible in setting of decreased kidney function - continue to monitor closely, borderline for gabapentin decrease - should kidney function decrease further would recommend reduction to 931m/day  ?-Encouraged patient to reach out to clinic with any medication questions or concerns  ? ?Subjective: ?Kimberly Guzzettais an 66y.o. year old female who is a primary patient of Burns, SClaudina Lick MD.  The CCM team was consulted for assistance with disease management and care coordination needs.   ? ?Engaged with patient by telephone for follow up visit in response to provider referral for pharmacy case management and/or care coordination services.  ? ?Consent to Services:  ?The patient was given the following information about Chronic Care Management services today, agreed to services, and gave verbal consent: 1. CCM service includes personalized support from designated clinical staff supervised by the primary care provider, including individualized plan of care and coordination with other care providers 2. 24/7 contact phone numbers for assistance for urgent and routine care needs. 3. Service will only be billed when office clinical staff spend 20 minutes or more in a month to coordinate care. 4. Only one practitioner may furnish and bill the service in a calendar month. 5.The  patient may stop CCM services at any time (effective at the end of the month) by phone call to the office staff. 6. The patient will be responsible for cost sharing (co-pay) of up to 20% of the service fee (after annual deductible is met). Patient agreed to services and consent obtained. ? ?Patient Care Team: ?BBinnie Rail MD as PCP - General (Internal Medicine) ?ANunzio Cobbs MD (Obstetrics and Gynecology) ?ZJana Half DPM (Podiatry) ?GHeath Lark MD as Consulting Physician (Hematology and Oncology) ?TLeighton Ruff MD (General Surgery) ?Armbruster, SCarlota Raspberry MD as Consulting Physician (Gastroenterology) ?STomasa Blase RWestern [facility] Centeras Pharmacist (Pharmacist) ? ?Recent office visits: ?03/27/2021 - Dr. BQuay Burow- pain in left buttock - referred to sports medicine, continue crestor twice weekly - stressed smoking cessation  ? ?Recent consult visits: ?07/02/2021 - Dr. JGlennon Mac- Sports Medicine - meloxicam 137mx 2 weeks, APAP [id number] prn for breakthrough pain  ? ?Hospital visits: ?None since last visit  ? ?Objective: ? ?Lab Results  ?Component Value Date  ? CREATININE 1.27 (H) 03/27/2021  ? BUN 28 (H) 03/27/2021  ? GFR 44.60 (L) 03/27/2021  ? GFRNONAA 41 (L) 11/10/2020  ? GFRAA >60 01/20/2015  ? NA 138 03/27/2021  ? K 4.2 03/27/2021  ? CALCIUM 9.7 03/27/2021  ? CO2 24 03/27/2021  ? GLUCOSE 100 (H) 03/27/2021  ? ? ?Lab Results  ?Component Value Date/Time  ? HGBA1C 6.6 (H) 03/27/2021 11:22 AM  ? HGBA1C 6.8 (H) 06/13/2020 08:16 AM  ? GFR 44.60 (L) 03/27/2021 11:22 AM  ? GFR 45.98 (L) 12/26/2020 12:23 PM  ? MICROALBUR 11.0 (H) 12/01/2016 08:47 AM  ?  ?Last  diabetic Eye exam:  ?No results found for: HMDIABEYEEXA  ?Last diabetic Foot exam:  ?No results found for: HMDIABFOOTEX  ? ?Lab Results  ?Component Value Date  ? CHOL 142 03/27/2021  ? HDL 49.90 03/27/2021  ? [place] 68 03/27/2021  ? LDLDIRECT 51.0 12/13/2018  ? TRIG 119.0 03/27/2021  ? CHOLHDL 3 03/27/2021  ? ? ? ?  Latest Ref Rng & Units 03/27/2021   ? 11:22 AM 11/10/2020  ? 11:39 AM 06/13/2020  ?  8:16 AM  ?Hepatic Function  ?Total Protein 6.0 - 8.3 g/dL 7.1   7.6   7.1    ?Albumin 3.5 - 5.2 g/dL 4.3   4.4   4.2    ?AST 0 - 37 U/L _0 ?ALT 0 - 35 U/L _1 ?Alk Phosphatase 39 - 117 U/L 69   57   62    ?Total Bilirubin 0.2 - 1.2 mg/dL 0.4   0.6   0.4    ? ? ?Lab Results  ?Component Value Date/Time  ? TSH 0.82 10/09/2015 04:45 PM  ? TSH 0.87 05/23/2014 09:27 AM  ? ? ? ?  Latest Ref Rng & Units 03/27/2021  ? 11:22 AM 11/10/2020  ? 11:39 AM 06/13/2020  ?  8:16 AM  ?CBC  ?WBC 4.0 - 10.5 K/uL 7.7   9.5   8.2    ?Hemoglobin 12.0 - 15.0 g/dL 12.6   12.7   13.5    ?Hematocrit 36.0 - 46.0 % 39.3   39.6   41.0    ?Platelets 150.0 - 400.0 K/uL 322.0   351   341.0    ? ? ?No results found for: VD25OH ? ?Clinical ASCVD: No  ?The 10-year ASCVD risk score (Arnett DK, et al., 2019) is: 26.1% ?  Values used to calculate the score: ?    Age: 66 years ?    Sex: Female ?    Is Non-Hispanic African American: Yes ?    Diabetic: Yes ?    Tobacco smoker: Yes ?    Systolic Blood Pressure: 841 mmHg ?    Is BP treated: Yes ?    HDL Cholesterol: 49.9 mg/dL ?    Total Cholesterol: 142 mg/dL   ? ? ?  03/22/2021  ?  8:32 AM 10/03/2020  ? 11:23 AM 01/02/2020  ? 10:19 AM  ?Depression screen PHQ 2/9  ?Decreased Interest 0 0 0  ?Down, Depressed, Hopeless 0 0 1  ?PHQ - 2 Score 0 0 1  ?  ? ?Social History  ? ?Tobacco Use  ?Smoking Status Every Day  ? Packs/day: 0.25  ? Years: 40.00  ? Pack years: 10.00  ? Types: Cigarettes  ?Smokeless Tobacco Never  ?Tobacco Comments  ? Pt. in project free program, motivated to stop smoking completely. Declined smoking cessation cone program.  ? ?BP Readings from Last 3 Encounters:  ?07/02/21 122/82  ?04/04/21 100/60  ?03/27/21 118/68  ? ?Pulse Readings from Last 3 Encounters:  ?07/02/21 78  ?04/04/21 79  ?03/27/21 65  ? ?Wt Readings from Last 3 Encounters:  ?07/02/21 163 lb (73.9 kg)  ?04/04/21 169 lb (76.7 kg)  ?03/27/21 168 lb (76.2 kg)  ? ?BMI  Readings from Last 3 Encounters:  ?07/02/21 26.31 kg/m?  ?04/04/21 27.28 kg/m?  ?03/27/21 27.12 kg/m?  ? ? ?Assessment/Interventions: Review of patient past medical history, allergies, medications, health status, including review of consultants reports, laboratory  and other test data, was performed as part of comprehensive evaluation and provision of chronic care management services.  ? ?SDOH:  (Social Determinants of Health) assessments and interventions performed: Yes ? ?SDOH Screenings  ? ?Alcohol Screen: Low Risk   ? Last Alcohol Screening Score (AUDIT): 1  ?Depression (PHQ2-9): Low Risk   ? PHQ-2 Score: 0  ?Financial Resource Strain: Low Risk   ? Difficulty of Paying Living Expenses: Not hard at all  ?Food Insecurity: No Food Insecurity  ? Worried About Charity fundraiser in the Last Year: Never true  ? Ran Out of Food in the Last Year: Never true  ?Housing: Low Risk   ? Last Housing Risk Score: 0  ?Physical Activity: Insufficiently Active  ? Days of Exercise per Week: 3 days  ? Minutes of Exercise per Session: 30 min  ?Social Connections: Moderately Isolated  ? Frequency of Communication with Friends and Family: Twice a week  ? Frequency of Social Gatherings with Friends and Family: Twice a week  ? Attends Religious Services: More than 4 times per year  ? Active Member of Clubs or Organizations: No  ? Attends Archivist Meetings: Never  ? Marital Status: Divorced  ?Stress: No Stress Concern Present  ? Feeling of Stress : Not at all  ?Tobacco Use: High Risk  ? Smoking Tobacco Use: Every Day  ? Smokeless Tobacco Use: Never  ? Passive Exposure: Not on file  ?Transportation Needs: No Transportation Needs  ? Lack of Transportation (Medical): No  ? Lack of Transportation (Non-Medical): No  ? ? ?CCM Care Plan ? ?Allergies  ?Allergen Reactions  ? Ampicillin Hives  ? Hydrocodone Itching  ?  Can take Oxycodone and Codeine  ? Penicillins Other (See Comments)  ?  Unknown reaction  ? Grapeseed Extract  [Nutritional Supplements] Rash  ? ? ?Medications Reviewed Today   ? ? Reviewed by Tomasa Blase, [facility] (Pharmacist) on 07/10/21 at 1445  Med List Status: <None>  ? ?Medication Order Taking? Sig Documenting Provider La

## 2021-07-10 NOTE — Patient Instructions (Signed)
Visit Information ? ?Following are the goals we discussed today:  ? ?Track and Manage My Blood Sugar  ? ?Timeframe:  Long-Range Goal ?Priority:  High ?Start Date:  09/20/2020                           ?Expected End Date:  03/28/2022                    ? ?Follow Up Date 10/2021 ?  ?- check blood sugar at prescribed times ?- check blood sugar if I feel it is too high or too low ?- enter blood sugar readings and medication or insulin into daily log ?- take the blood sugar log to all doctor visits  ?  ?Why is this important?   ?Checking your blood sugar at home helps to keep it from getting very high or very low.  ?Writing the results in a diary or log helps the doctor know how to care for you.  ?Your blood sugar log should have the time, date and the results.  ?Also, write down the amount of insulin or other medicine that you take.  ?Other information, like what you ate, exercise done and how you were feeling, will also be helpful.   ?  ?Notes: Patient to reach out should blood sugars elevate out of goal range (<150) or should she experience more frequent hypoglycemic episodes  ? ?Plan: Telephone follow up appointment with care management team member scheduled for:  4-5 months ?The patient has been provided with contact information for the care management team and has been advised to call with any health related questions or concerns.  ? ?Tomasa Blase, PharmD ?Clinical Pharmacist, [place]  ? ?Please call the care guide team at [phone] if you need to cancel or reschedule your appointment.  ? ?The patient verbalized understanding of instructions, educational materials, and care plan provided today and declined offer to receive copy of patient instructions, educational materials, and care plan.  ? ? ?

## 2021-07-12 DIAGNOSIS — E1143 Type 2 diabetes mellitus with diabetic autonomic (poly)neuropathy: Secondary | ICD-10-CM | POA: Diagnosis not present

## 2021-07-12 DIAGNOSIS — I10 Essential (primary) hypertension: Secondary | ICD-10-CM | POA: Diagnosis not present

## 2021-07-12 DIAGNOSIS — E781 Pure hyperglyceridemia: Secondary | ICD-10-CM | POA: Diagnosis not present

## 2021-07-25 ENCOUNTER — Ambulatory Visit: Payer: Medicare Other | Admitting: Sports Medicine

## 2021-07-29 NOTE — Progress Notes (Signed)
? ? [name] ?[facility] ?[place] ?Phone: [phone] ?  ?Assessment and Plan:   ?  ?1. Pain in left buttock ?2. Chronic bilateral low back pain with left-sided sciatica ?3. DDD (degenerative disc disease), lumbar ?-Chronic with exacerbation, subsequent sports medicine visit ?- Minimal improvement after 2-week course of meloxicam and HEP with symptoms most consistent with lumbar etiology.  Differential includes spinal stenosis versus facet arthropathy ?- Patient has not significantly improved with >6 weeks conservative therapy, so we will proceed with lumbar MRI at this time.  Could consider epidural injection depending on MRI results ?-Discontinue meloxicam daily.  May use remainder as needed ?- Start Tylenol 500 to 1000 mg tablets 2-3 times a day for day-to-day pain relief ? ?Pertinent previous records reviewed include none ?  ?Follow Up: - Patient has not significantly improved with >6 weeks conservative therapy, so we will proceed with lumbar MRI at this time.  Could consider epidural injection depending on MRI results  ? ?  ?Subjective:   ?I, Pincus Badder, am serving as a Education administrator for [employer] ?  ?Chief Complaint: sciatic pain  ?  ?HPI:  ?04/04/2021 ?Patient is a 66 year old female complaining of left buttock pain. Patient states  she has pain under her left buttock for about 2 weeks she also states that she has been lifting her paralyzed significant other but does not remember a specific  MOI and also has a hernia on that left side does radiate down the back of the leg, the pain aches with movement has neuropathy  ?  ?07/02/2021 ?Patient states the pain is now underneath her left buttock and across her low back but and down her side, does state she does a lot of walking, the meloxicam worked but the flexeril was no help at all, thought she was getting better but had a flare up about  month ago ?  ?07/29/2021 ?Patient states  that she is okay she is using the mobic and tylenol , she has learned new things that will help alleviate some pain, she thinks maybe her hernia could be causing some problems pain is across her hip and through her glutes , some days its okay and then some days she has pain if she moves a certain way  ?  ?Relevant Historical Information: DM type II, history of rectal cancer with colostomy bag ? ?Additional pertinent review of systems negative. ? ? ?Current Outpatient Medications:  ?  ALPRAZolam (XANAX) 0.5 MG tablet, TAKE 1 TABLET BY MOUTH TWICE  DAILY AS NEEDED FOR ANXIETY, Disp: 180 tablet, Rfl: 0 ?  Blood Glucose Monitoring Suppl (ACCU-CHEK GUIDE) w/Device KIT, USE TO CHECK BLOOD SUGAR  ONCE DAILY AND AS NEEDED, Disp: 1 kit, Rfl: 2 ?  colchicine 0.6 MG tablet, TAKE 2 TABLETS BY MOUTH FOR ONE DOSE AND THEN 1 TABLET  BY MOUTH 1 HOUR LATER FOR  GOUT FLARE, Disp: 21 tablet, Rfl: 3 ?  fenofibrate (TRICOR) 145 MG tablet, TAKE 1 TABLET BY MOUTH ONCE DAILY, Disp: 90 tablet, Rfl: 3 ?  gabapentin (NEURONTIN) 600 MG tablet, TAKE 1 TABLET BY MOUTH 3  TIMES DAILY, Disp: 270 tablet, Rfl: 3 ?  glucose blood (FREESTYLE LITE) test strip, Use as instructed, Disp: 100 each, Rfl: 12 ?  hydrochlorothiazide (HYDRODIURIL) 25 MG tablet, TAKE 1 TABLET BY MOUTH  DAILY, Disp: 90 tablet, Rfl: 3 ?  Lancets (ONETOUCH ULTRASOFT) lancets, Use as directed to check sugars once daily  and as needed.  Dx E11.43, Disp: 100 each, Rfl: 12 ?  lisinopril (ZESTRIL) 10 MG tablet, TAKE 1 TABLET BY MOUTH  DAILY, Disp: 90 tablet, Rfl: 3 ?  meloxicam (MOBIC) 15 MG tablet, Take 1 tablet (15 mg total) by mouth daily., Disp: 30 tablet, Rfl: 0 ?  metFORMIN (GLUCOPHAGE) 500 MG tablet, TAKE 1 TABLET BY MOUTH  TWICE DAILY WITH MEALS, Disp: 180 tablet, Rfl: 3 ?  rosuvastatin (CRESTOR) 5 MG tablet, TAKE 1 TABLET BY MOUTH  TWICE WEEKLY, Disp: 26 tablet, Rfl: 3 ?  vitamin B-12 (CYANOCOBALAMIN) 500 MCG tablet, Take 1,500 mcg by mouth daily., Disp: , Rfl:   ? ?Objective:    ?  ?Vitals:  ? 07/30/21 1447  ?BP: 110/80  ?Pulse: 66  ?SpO2: 98%  ?Weight: 164 lb (74.4 kg)  ?Height: _0  (1.676 m)  ?  ?  ?Body mass index is 26.47 kg/m?.  ?  ?Physical Exam:   ? ?Gen: Appears well, nad, nontoxic and pleasant ?Psych: Alert and oriented, appropriate mood and affect ?Neuro: sensation intact, strength is 5/5 in upper and lower extremities, muscle tone wnl ?Skin: no susupicious lesions or rashes ?  ?Back - Normal skin, Spine with normal alignment and no deformity.   ?No tenderness to vertebral process palpation.   ?Paraspinous muscles are mildly tender and without spasm ?Straight leg raise positive on left, negative on right   ?   ?Left hip: ?No deformity, swelling or wasting ?ROM Flexion 90, ext 20, IR 35, ER 45 ?TTP gluteal musculature, lumbar spine ?NTTP over the hip flexors, greater troch,  si joint,   ?Negative log roll with FROM ?Negative FABER ?Positive FADIR ?Positive piriformis test ?  ?Gait normal  ? ? ?Electronically signed by:  ?[name] ?[facility] ?3:06 PM 07/30/21 ?

## 2021-07-30 ENCOUNTER — Ambulatory Visit (INDEPENDENT_AMBULATORY_CARE_PROVIDER_SITE_OTHER): Payer: Medicare Other | Admitting: Sports Medicine

## 2021-07-30 VITALS — BP 110/80 | HR 66 | Ht 66.0 in | Wt 164.0 lb

## 2021-07-30 DIAGNOSIS — M5442 Lumbago with sciatica, left side: Secondary | ICD-10-CM

## 2021-07-30 DIAGNOSIS — M7918 Myalgia, other site: Secondary | ICD-10-CM

## 2021-07-30 DIAGNOSIS — M5136 Other intervertebral disc degeneration, lumbar region: Secondary | ICD-10-CM

## 2021-07-30 DIAGNOSIS — G8929 Other chronic pain: Secondary | ICD-10-CM | POA: Diagnosis not present

## 2021-07-30 NOTE — Patient Instructions (Addendum)
Good to see you  ?MRI referral  ?Stop daily meloxicam use remainder as needed  ?Tylenol [id number] mg 2-3 times a day for pain relief  ?Follow up with [place] 3 days after your MRI to discuss results  ?

## 2021-07-31 NOTE — Addendum Note (Signed)
Addended by: Douglass Rivers T on: 07/31/2021 02:15 PM ? ? Modules accepted: Orders ? ?

## 2021-08-28 ENCOUNTER — Ambulatory Visit
Admission: RE | Admit: 2021-08-28 | Discharge: 2021-08-28 | Disposition: A | Discharge status: Home / Self Care | Payer: Medicare Other | Source: Ambulatory Visit | Attending: Internal Medicine | Admitting: Internal Medicine

## 2021-08-28 DIAGNOSIS — M85832 Other specified disorders of bone density and structure, left forearm: Secondary | ICD-10-CM | POA: Diagnosis not present

## 2021-08-28 DIAGNOSIS — Z780 Asymptomatic menopausal state: Secondary | ICD-10-CM

## 2021-08-31 ENCOUNTER — Encounter: Payer: Self-pay | Admitting: Internal Medicine

## 2021-08-31 DIAGNOSIS — M8580 Other specified disorders of bone density and structure, unspecified site: Secondary | ICD-10-CM | POA: Insufficient documentation

## 2021-09-27 ENCOUNTER — Other Ambulatory Visit: Payer: Self-pay | Admitting: Internal Medicine

## 2021-09-29 NOTE — Patient Instructions (Addendum)
   Prevnar pneumonia 20 given today.   Blood work was ordered.     Medications changes include :   none     Return in about 6 months (around 04/02/2022) for follow up.

## 2021-09-29 NOTE — Progress Notes (Unsigned)
Subjective:    Patient ID: [name], female    DOB: [birth year], 66 y.o.   MRN: [id number]     HPI [name] is here for follow up of her chronic medical problems, including htn, DM, hld, peripheral neuropathy, h/o gout, anxiety, B12 def  She is now vaping - not smoking.  She vapes less than she smokes.  She is trying to cut done.    She has a split in her bottom lip and keeps cracking when she opens her mouth and it improves with A& D ointment but then splits again.    She walks daily  She is not drinking alcohol daily.  She drinks socially.    Medications and allergies reviewed with patient and updated if appropriate.  Current Outpatient Medications on File Prior to Visit  Medication Sig Dispense Refill   ALPRAZolam (XANAX) 0.5 MG tablet TAKE 1 TABLET BY MOUTH TWICE  DAILY AS NEEDED FOR ANXIETY 180 tablet 0   Blood Glucose Monitoring Suppl (ACCU-CHEK GUIDE) w/Device KIT USE TO CHECK BLOOD SUGAR  ONCE DAILY AND AS NEEDED 1 kit 2   colchicine 0.6 MG tablet TAKE 2 TABLETS BY MOUTH FOR ONE DOSE AND THEN 1 TABLET  BY MOUTH 1 HOUR LATER FOR   GOUT FLARE 21 tablet 3   fenofibrate (TRICOR) 145 MG tablet TAKE 1 TABLET BY MOUTH ONCE DAILY 90 tablet 3   gabapentin (NEURONTIN) 600 MG tablet TAKE 1 TABLET BY MOUTH 3  TIMES DAILY 270 tablet 3   glucose blood (FREESTYLE LITE) test strip Use as instructed 100 each 12   hydrochlorothiazide (HYDRODIURIL) 25 MG tablet TAKE 1 TABLET BY MOUTH  DAILY 90 tablet 3   Lancets (ONETOUCH ULTRASOFT) lancets Use as directed to check sugars once daily and as needed.  Dx E11.43 100 each 12   lisinopril (ZESTRIL) 10 MG tablet TAKE 1 TABLET BY MOUTH  DAILY 90 tablet 3   meloxicam (MOBIC) 15 MG tablet Take 1 tablet (15 mg total) by mouth daily. 30 tablet 0   metFORMIN (GLUCOPHAGE) 500 MG tablet TAKE 1 TABLET BY MOUTH  TWICE DAILY WITH MEALS 180 tablet 3   rosuvastatin (CRESTOR) 5 MG tablet TAKE 1 TABLET BY MOUTH  TWICE WEEKLY 26 tablet 3    vitamin B-12 (CYANOCOBALAMIN) 500 MCG tablet Take 1,500 mcg by mouth daily.     No current facility-administered medications on file prior to visit.     Review of Systems  Constitutional:  Negative for appetite change, chills and fever.  HENT:  Negative for congestion, ear pain, sinus pain and sore throat.   Respiratory:  Positive for cough (chronic - mostly dry, occ sputum). Negative for shortness of breath and wheezing.   Cardiovascular:  Negative for chest pain, palpitations and leg swelling.  Musculoskeletal:  Positive for joint swelling (left knee).  Neurological:  Negative for light-headedness and headaches.       Objective:   Vitals:   10/01/21 0756  BP: 118/60  Pulse: 63  Temp: 98.1 F (36.7 C)  SpO2: 95%   BP Readings from Last 3 Encounters:  10/01/21 118/60  07/30/21 110/80  07/02/21 122/82   Wt Readings from Last 3 Encounters:  10/01/21 163 lb (73.9 kg)  07/30/21 164 lb (74.4 kg)  07/02/21 163 lb (73.9 kg)   Body mass index is 26.31 kg/m.    Physical Exam Constitutional:      General: She is not in acute distress.    Appearance: Normal appearance.  HENT:     Head: Normocephalic and atraumatic.  Eyes:     Conjunctiva/sclera: Conjunctivae normal.  Cardiovascular:     Rate and Rhythm: Normal rate and regular rhythm.     Heart sounds: Normal heart sounds. No murmur heard. Pulmonary:     Effort: Pulmonary effort is normal. No respiratory distress.     Breath sounds: Normal breath sounds. No wheezing.  Musculoskeletal:     Cervical back: Neck supple.     Right lower leg: No edema.     Left lower leg: No edema.  Lymphadenopathy:     Cervical: No cervical adenopathy.  Skin:    General: Skin is warm and dry.     Findings: No rash.  Neurological:     Mental Status: She is alert. Mental status is at baseline.  Psychiatric:        Mood and Affect: Mood normal.        Behavior: Behavior normal.        Lab Results  Component Value Date   WBC 7.7  03/27/2021   HGB 12.6 03/27/2021   HCT 39.3 03/27/2021   PLT 322.0 03/27/2021   GLUCOSE 100 (H) 03/27/2021   CHOL 142 03/27/2021   TRIG 119.0 03/27/2021   HDL 49.90 03/27/2021   LDLDIRECT 51.0 12/13/2018   LDLCALC 68 03/27/2021   ALT 11 03/27/2021   AST 17 03/27/2021   NA 138 03/27/2021   K 4.2 03/27/2021   CL 106 03/27/2021   CREATININE 1.27 (H) 03/27/2021   BUN 28 (H) 03/27/2021   CO2 24 03/27/2021   TSH 0.82 10/09/2015   INR 0.91 04/01/2013   HGBA1C 6.6 (H) 03/27/2021   MICROALBUR 11.0 (H) 12/01/2016     Assessment & Plan:   Prevnar 20 given today  See Problem List for Assessment and Plan of chronic medical problems.

## 2021-09-30 ENCOUNTER — Encounter: Payer: Self-pay | Admitting: Internal Medicine

## 2021-10-01 ENCOUNTER — Ambulatory Visit (INDEPENDENT_AMBULATORY_CARE_PROVIDER_SITE_OTHER): Payer: Medicare Other | Admitting: Internal Medicine

## 2021-10-01 VITALS — BP 118/60 | HR 63 | Temp 98.1°F | Ht 66.0 in | Wt 163.0 lb

## 2021-10-01 DIAGNOSIS — I700 Atherosclerosis of aorta: Secondary | ICD-10-CM

## 2021-10-01 DIAGNOSIS — G629 Polyneuropathy, unspecified: Secondary | ICD-10-CM | POA: Insufficient documentation

## 2021-10-01 DIAGNOSIS — I10 Essential (primary) hypertension: Secondary | ICD-10-CM

## 2021-10-01 DIAGNOSIS — Z8739 Personal history of other diseases of the musculoskeletal system and connective tissue: Secondary | ICD-10-CM | POA: Diagnosis not present

## 2021-10-01 DIAGNOSIS — E1143 Type 2 diabetes mellitus with diabetic autonomic (poly)neuropathy: Secondary | ICD-10-CM | POA: Diagnosis not present

## 2021-10-01 DIAGNOSIS — E781 Pure hyperglyceridemia: Secondary | ICD-10-CM | POA: Diagnosis not present

## 2021-10-01 DIAGNOSIS — G6289 Other specified polyneuropathies: Secondary | ICD-10-CM

## 2021-10-01 DIAGNOSIS — G622 Polyneuropathy due to other toxic agents: Secondary | ICD-10-CM

## 2021-10-01 DIAGNOSIS — F419 Anxiety disorder, unspecified: Secondary | ICD-10-CM

## 2021-10-01 LAB — COMPREHENSIVE METABOLIC PANEL
ALT: 13 U/L (ref 0–35)
AST: 19 U/L (ref 0–37)
Albumin: 4.1 g/dL (ref 3.5–5.2)
Alkaline Phosphatase: 58 U/L (ref 39–117)
BUN: 22 mg/dL (ref 6–23)
CO2: 23 mEq/L (ref 19–32)
Calcium: 9.7 mg/dL (ref 8.4–10.5)
Chloride: 104 mEq/L (ref 96–112)
Creatinine, Ser: 1.2 mg/dL (ref 0.40–1.20)
GFR: 47.57 mL/min — ABNORMAL LOW (ref >60.00)
Glucose, Bld: 104 mg/dL — ABNORMAL HIGH (ref 70–99)
Potassium: 4.8 mEq/L (ref 3.5–5.1)
Sodium: 135 mEq/L (ref 135–145)
Total Bilirubin: 0.4 mg/dL (ref 0.2–1.2)
Total Protein: 6.9 g/dL (ref 6.0–8.3)

## 2021-10-01 LAB — LIPID PANEL
Cholesterol: 135 mg/dL (ref 0–200)
HDL: 48.1 mg/dL (ref >39.00)
LDL Cholesterol: 64 mg/dL (ref 0–99)
NonHDL: 86.95
Total CHOL/HDL Ratio: 3
Triglycerides: 115 mg/dL (ref 0.0–149.0)
VLDL: 23 mg/dL (ref 0.0–40.0)

## 2021-10-01 LAB — VITAMIN B12: Vitamin B-12: 518 pg/mL (ref 211–911)

## 2021-10-01 LAB — HEMOGLOBIN A1C: Hgb A1c MFr Bld: 6.1 % (ref 4.6–6.5)

## 2021-10-01 NOTE — Assessment & Plan Note (Signed)
Chronic BP well controlled Continue lisinopril 10 mg daily, hydrochlorothiazide 25 mg daily cmp

## 2021-10-01 NOTE — Assessment & Plan Note (Signed)
Chronic  Lab Results  Component Value Date   HGBA1C 6.6 (H) 03/27/2021   Sugars  controlled Testing sugars 1 times a day Check A1c, urine microalbumin today Continue metformin '500mg'$  bid ac Stressed regular exercise, diabetic diet

## 2021-10-01 NOTE — Assessment & Plan Note (Addendum)
Chronic controlled Continue gabapentin 600 mg 3 times daily

## 2021-10-01 NOTE — Assessment & Plan Note (Signed)
Chronic Controlled Continue gabapentin 600 mg 3 times daily

## 2021-10-01 NOTE — Assessment & Plan Note (Signed)
Chronic LDL at goal Continue Crestor 5 mg twice weekly, fenofibrate 145 mg daily Continue regular walking Healthy diet encouraged Smoking cessation encouraged

## 2021-10-01 NOTE — Assessment & Plan Note (Addendum)
Chronic Controlled, Stable Continue continue alprazolam 0.5 mg twice daily as needed - not taking daily

## 2021-10-01 NOTE — Assessment & Plan Note (Signed)
Chronic Gets gout attacks 2-3 times a year Continue colchicine as needed, which works well

## 2021-10-01 NOTE — Assessment & Plan Note (Signed)
Chronic Regular exercise and healthy diet encouraged Check lipid panel  Continue fenofibrate 145 mg daily, Crestor 5 mg twice weekly

## 2021-10-08 ENCOUNTER — Other Ambulatory Visit: Payer: Self-pay | Admitting: Internal Medicine

## 2021-10-08 DIAGNOSIS — F419 Anxiety disorder, unspecified: Secondary | ICD-10-CM

## 2021-10-09 ENCOUNTER — Other Ambulatory Visit: Payer: Self-pay | Admitting: Internal Medicine

## 2021-10-09 ENCOUNTER — Telehealth: Payer: Self-pay | Admitting: Internal Medicine

## 2021-10-09 NOTE — Telephone Encounter (Signed)
Caller & Relationship to patient:Self   Call back OPRAFO:[phone]   Date of last office visit:   Date of next office visit:   Medication(s) to be refilled:ALPRAZolam Duanne Moron) 0.5 MG tablet        Preferred [facility]acy: [facility] [place], [place] Phone:  [phone]  Fax:  [phone]

## 2021-10-10 NOTE — Progress Notes (Signed)
[place] Phone: [phone] Subjective:   [name], am serving as a scribe for Dr. Hulan Saas.  I'm seeing this patient by the request  of:  Binnie Rail, MD  CC: back pain after fall   STM:[id number]  07/30/2021 1. Pain in left buttock 2. Chronic bilateral low back pain with left-sided sciatica 3. DDD (degenerative disc disease), lumbar -Chronic with exacerbation, subsequent sports medicine visit - Minimal improvement after 2-week course of meloxicam and HEP with symptoms most consistent with lumbar etiology.  Differential includes spinal stenosis versus facet arthropathy - Patient has not significantly improved with >6 weeks conservative therapy, so we will proceed with lumbar MRI at this time.  Could consider epidural injection depending on MRI results -Discontinue meloxicam daily.  May use remainder as needed - Start Tylenol 500 to 1000 mg tablets 2-3 times a day for day-to-day pain relief   Pertinent previous records reviewed include none   Follow Up: - Patient has not significantly improved with >6 weeks conservative therapy, so we will proceed with lumbar MRI at this time.  Could consider epidural injection depending on MRI results   Updated 10/11/2021 [name] is a 66 y.o. female coming in with complaint of back pain. Saw Dr. Glennon Mac on 07/30/2021. Patient states she fell on Sunday. Pain increased over L ischial tuberosity and top of SI joint. Pain radiates down back of her leg. Has not been sleeping due to pain. Using Tylenol and gabapentin.       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   LAR surg 02/2009, chemo thru 09/2009   Tibial plateau fracture, left 01/19/2014   Past Surgical History:  Procedure Laterality Date   APPENDECTOMY N/A 01/09/2015   Procedure: APPENDECTOMY;  Surgeon: Excell Seltzer, MD;  Location: [facility];  Service: General;  Laterality: N/A;   COLONOSCOPY  01/09/2015   [facility]   COLOSTOMY  2013   [facility]   FINGER FRACTURE SURGERY Left    and hand surgery   HERNIA REPAIR     LAPAROSCOPIC PARASTOMAL HERNIA N/A 05/12/2013   Procedure: LAPAROSCOPIC PARASTOMAL HERNIA;  Surgeon: Leighton Ruff, MD;  Location: [facility];  Service: General;  Laterality: N/A;   LAPAROTOMY N/A 01/09/2015   Procedure: EXPLORATORY LAPAROTOMY;  Surgeon: Excell Seltzer, MD;  Location: [facility];  Service: General;  Laterality: N/A;  RESECTION OF COLOSTOMY, CREATION OF NEW COLOSTOMY, VENTRAL HERNIA REPAIR WITH BIOLOGIC MESH,  LYSIS OF ADHESIONS , OMENTECTOMY   LOW ANTERIOR BOWEL RESECTION  03/07/09   MOUTH SURGERY  10/2009   PORTACATH PLACEMENT     and removal   Social History   Socioeconomic History   Marital status: Significant Other    Spouse name: Not on file   Number of children: 0   Years of education: Not on file   Highest education level: Not on file  Occupational History   Occupation: disabiled  Tobacco Use   Smoking status: Every Day    Packs/day: 0.25    Years: 40.00    Total pack years: 10.00    Types: Cigarettes   Smokeless tobacco: Never   Tobacco comments:    Pt. in project free program, motivated to stop smoking completely. Declined smoking cessation cone program.  Vaping Use   Vaping Use: Never used  Substance and Sexual Activity   Alcohol use: Yes    Alcohol/week: 18.0 standard drinks of alcohol    Types: 14 Cans of beer, 4 Glasses of wine per week   Drug use: Yes    Frequency: 1.0 times per  week    Types: Marijuana, Cocaine    Comment: Pt is using Marijuana; last use of cocaine nov 2014    Sexual activity: Not on file  Other Topics Concern   Not on file  Social History Narrative   Divorced, has significant other for past 30 years; values spending time with 2 grandsons and 41 year old dog who lives with significant other   Frequent moves between family with stressors in relationship   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, jewellery, Psychologist, 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, 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   Social Determinants of Health   Financial Resource Strain: [place]  (03/22/2021)   Overall Financial Resource Strain (CARDIA)    Difficulty of Paying Living Expenses: Not hard at all  Food Insecurity: Griffin Food Insecurity (03/22/2021)   Hunger Vital Sign    Worried About Running Out of Food in the Last Year: Never true    [place] in the Last Year: Never true  Transportation Needs: Griffin Transportation Needs (03/22/2021)   PRAPARE - Hydrologist (Medical): Griffin    Lack of Transportation (Non-Medical): Griffin  Physical Activity: Insufficiently Active (03/22/2021)   Exercise Vital Sign    Days of Exercise per Week: 3 days    Minutes of Exercise per Session: 30 min  Stress: Griffin Stress Concern Present (03/22/2021)   [place]    Feeling of Stress : Not at all  Social Connections: Moderately Isolated (03/22/2021)   Social Connection and Isolation Panel [NHANES]    Frequency of Communication with Friends and Family: Twice a week    Frequency of Social Gatherings with Friends and Family: Twice a week    Attends Religious Services: More than 4 times per year    Active Member of [employer] or Organizations: Griffin    Attends Music therapist: Never    Marital Status: Divorced   Allergies  Allergen Reactions   Ampicillin Hives   Hydrocodone Itching     Can take Oxycodone and Codeine   Penicillins Other (See Comments)    Unknown reaction   Grapeseed Extract [Nutritional Supplements] Rash   Family History  Problem Relation Age of Onset   Colon cancer Mother 58   Diabetes Father    Cirrhosis Brother    Stomach cancer Sister    Colon polyps Sister    Pulmonary Hypertension Brother    Esophageal cancer Neg Hx    Rectal cancer Neg Hx     Current Outpatient Medications (Endocrine & Metabolic):    metFORMIN (GLUCOPHAGE) 500 MG tablet, TAKE 1 TABLET BY MOUTH  TWICE DAILY WITH MEALS  Current Outpatient Medications (Cardiovascular):    fenofibrate (TRICOR) 145 MG  tablet, TAKE 1 TABLET BY MOUTH ONCE DAILY   hydrochlorothiazide (HYDRODIURIL) 25 MG tablet, TAKE 1 TABLET BY MOUTH  DAILY   lisinopril (ZESTRIL) 10 MG tablet, TAKE 1 TABLET BY MOUTH  DAILY   rosuvastatin (CRESTOR) 5 MG tablet, TAKE 1 TABLET BY MOUTH  TWICE WEEKLY   Current Outpatient Medications (Analgesics):    colchicine 0.6 MG tablet, TAKE 2 TABLETS BY MOUTH FOR ONE DOSE AND THEN 1 TABLET  BY MOUTH 1 HOUR LATER FOR   GOUT FLARE   meloxicam (MOBIC) 15 MG tablet, Take 1 tablet (15 mg total) by mouth daily.  Current Outpatient Medications (Hematological):    vitamin B-12 (CYANOCOBALAMIN) 500 MCG tablet, Take 1,500 mcg by mouth daily.  Current Outpatient Medications (Other):    ALPRAZolam (XANAX) 0.5 MG tablet, TAKE 1 TABLET BY MOUTH TWICE  DAILY AS NEEDED FOR ANXIETY   Blood Glucose Monitoring Suppl (ACCU-CHEK GUIDE) w/Device KIT, USE TO CHECK BLOOD SUGAR  ONCE DAILY AND AS NEEDED   gabapentin (NEURONTIN) 600 MG tablet, TAKE 1 TABLET BY MOUTH 3  TIMES DAILY   glucose blood (FREESTYLE LITE) test strip, Use as instructed   Lancets (ONETOUCH ULTRASOFT) lancets, Use as directed to check sugars once daily and as needed.  Dx E11.43   Reviewed prior external information including notes and imaging from  primary care provider As well as notes that were available from care  everywhere and other healthcare systems.  Past medical history, social, surgical and family history all reviewed in electronic medical record.  Griffin pertanent information unless stated regarding to the chief complaint.   Review of Systems:  Griffin headache, visual changes, nausea, vomiting, diarrhea, constipation, dizziness, abdominal pain, skin rash, fevers, chills, night sweats, weight loss, swollen lymph nodes, body aches, joint swelling, chest pain, shortness of breath, mood changes. POSITIVE muscle aches  Objective  Blood pressure 124/78, pulse 78, height '5\' 6"'  (1.676 m), weight 160 lb (72.6 kg), SpO2 97 %.   General: Griffin apparent distress alert and oriented x3 mood normal but patient does seem to be very uncomfortable. HEENT: Pupils equal, extraocular movements intact  Respiratory: Patient's speak in full sentences and does not appear short of breath  Cardiovascular: Griffin lower extremity edema, non tender, Griffin erythema  Antalgic gait noted. Patient does have tenderness to palpation in the paraspinal musculature in the lumbar spine left greater than right.  Seems to be more in the soft tissue. Patient does have some tightness noted with FABER test on the left side.  Patient is found to have weakness noted with 3+ out of 5 strength with dorsiflexion.  Deep tendon reflexes are intact. Patient does have a colostomy bag in place.  Large hernia noted in the left lower quadrant.  Nontender though.    Impression and Recommendations:

## 2021-10-11 ENCOUNTER — Ambulatory Visit (INDEPENDENT_AMBULATORY_CARE_PROVIDER_SITE_OTHER): Payer: Medicare Other

## 2021-10-11 ENCOUNTER — Ambulatory Visit: Payer: Medicare Other | Admitting: Family Medicine

## 2021-10-11 VITALS — BP 124/78 | HR 78 | Ht 66.0 in | Wt 160.0 lb

## 2021-10-11 DIAGNOSIS — M545 Low back pain, unspecified: Secondary | ICD-10-CM

## 2021-10-11 DIAGNOSIS — M25552 Pain in left hip: Secondary | ICD-10-CM | POA: Diagnosis not present

## 2021-10-11 DIAGNOSIS — M5416 Radiculopathy, lumbar region: Secondary | ICD-10-CM | POA: Diagnosis not present

## 2021-10-11 DIAGNOSIS — E538 Deficiency of other specified B group vitamins: Secondary | ICD-10-CM | POA: Diagnosis not present

## 2021-10-11 MED ORDER — KETOROLAC TROMETHAMINE 60 MG/2ML IM SOLN
60.0000 mg | Freq: Once | INTRAMUSCULAR | Status: AC
  Administered 2021-10-11: 60 mg via INTRAMUSCULAR

## 2021-10-11 MED ORDER — METHYLPREDNISOLONE ACETATE 80 MG/ML IJ SUSP
80.0000 mg | Freq: Once | INTRAMUSCULAR | Status: AC
  Administered 2021-10-11: 80 mg via INTRAMUSCULAR

## 2021-10-11 MED ORDER — CYANOCOBALAMIN 1000 MCG/ML IJ SOLN
1000.0000 ug | Freq: Once | INTRAMUSCULAR | Status: AC
  Administered 2021-10-11: 1000 ug via INTRAMUSCULAR

## 2021-10-11 NOTE — Assessment & Plan Note (Signed)
Patient does have lumbar radiculopathy.  Patient also noted does have chronic kidney disease and would like her to avoid chronic anti-inflammatories.  Patient given injection of Toradol and Depo-Medrol to help.  Patient will watch her blood sugars a little closer.  Patient previously was to get an MRI and which she never did and now has some weakness of the lower extremity on the left side.  Do feel that MRI is necessary to further evaluate for any nerve root impingement that would need some type of intervention.  Patient is in agreement with the plan.  Due to is going on a holiday weekend if worsening pain patient needs to go to the emergency room.

## 2021-10-11 NOTE — Assessment & Plan Note (Signed)
Given patient's B12 injection this month.

## 2021-10-11 NOTE — Patient Instructions (Addendum)
MRI [facility] [phone] Injections in backside today Watch blood sugar for next 3 days Dr. Glennon Mac will see you at your next scheduled appt

## 2021-10-24 ENCOUNTER — Ambulatory Visit
Admission: RE | Admit: 2021-10-24 | Discharge: 2021-10-24 | Disposition: A | Discharge status: Home / Self Care | Payer: Medicare Other | Source: Ambulatory Visit | Attending: Family Medicine | Admitting: Family Medicine

## 2021-10-24 DIAGNOSIS — M545 Low back pain, unspecified: Secondary | ICD-10-CM

## 2021-10-24 DIAGNOSIS — M79605 Pain in left leg: Secondary | ICD-10-CM | POA: Diagnosis not present

## 2021-10-24 DIAGNOSIS — M48061 Spinal stenosis, lumbar region without neurogenic claudication: Secondary | ICD-10-CM | POA: Diagnosis not present

## 2021-10-25 NOTE — Progress Notes (Signed)
[name] [name] [address] [place] [address] Phone: [phone]   Assessment and Plan:     1. Lumbar spine pain 2. DDD (degenerative disc disease), lumbar 3. Chronic bilateral low back pain with left-sided sciatica -Chronic with exacerbation, subsequent visit - Continued low back pain that is severe at times and limits patient's day-to-day activities, with radicular symptoms to left lower extremity consistent with patient's MRI showing L2-3 large inferiorly migrating disc extrusion causing severe spinal stenosis. - Reviewed MRI with patient in room which included L2-3 large inferior migrating disc extrusion causing severe spinal stenosis, L3-4 advanced spinal stenosis, L4-5 moderate spinal stenosis and L5-S1 severe foraminal stenosis - Based on patient's symptoms, we will start with epidural injection L2-L3 on the left and follow-up with patient 2 weeks after injection to review benefit - DG INJECT DIAG/THERA/INC NEEDLE/CATH/PLC EPI/LUMB/SAC W/IMG; Future    Pertinent previous records reviewed include lumbar MRI 10/24/2021   Follow Up: 2 weeks after epidural injection to review benefit   Subjective:   I, [name], am serving as a Neurosurgeon for Doctor [employer]  Chief Complaint: continued pain   HPI:   07/30/2021 1. Pain in left buttock 2. Chronic bilateral low back pain with left-sided sciatica 3. DDD (degenerative disc disease), lumbar -Chronic with exacerbation, subsequent sports medicine visit - Minimal improvement after 2-week course of meloxicam and HEP with symptoms most consistent with lumbar etiology.  Differential includes spinal stenosis versus facet arthropathy - Patient has not significantly improved with >6 weeks conservative therapy, so we will proceed with lumbar MRI at this time.  Could consider epidural injection depending on MRI results -Discontinue meloxicam daily.  May use remainder as needed -  Start Tylenol 500 to 1000 mg tablets 2-3 times a day for day-to-day pain relief   Pertinent previous records reviewed include none   Follow Up: - Patient has not significantly improved with >6 weeks conservative therapy, so we will proceed with lumbar MRI at this time.  Could consider epidural injection depending on MRI results    Updated 10/11/2021 [name] is a 66 y.o. female coming in with complaint of back pain. Saw Dr. Jean Rosenthal on 07/30/2021. Patient states she fell on Sunday. Pain increased over L ischial tuberosity and top of SI joint. Pain radiates down back of her leg. Has not been sleeping due to pain. Using Tylenol and gabapentin.   10/28/2021 Patient states she is still having the pain after b12, steroid , she was okay over the weekend now the pain is back , she isnt able to sleep   Relevant Historical Information: Hypertension, DM type II,  Additional pertinent review of systems negative.   Current Outpatient Medications:    ALPRAZolam (XANAX) 0.5 MG tablet, TAKE 1 TABLET BY MOUTH TWICE  DAILY AS NEEDED FOR ANXIETY, Disp: 180 tablet, Rfl: 0   Blood Glucose Monitoring Suppl (ACCU-CHEK GUIDE) w/Device KIT, USE TO CHECK BLOOD SUGAR  ONCE DAILY AND AS NEEDED, Disp: 1 kit, Rfl: 2   colchicine 0.6 MG tablet, TAKE 2 TABLETS BY MOUTH FOR ONE DOSE AND THEN 1 TABLET  BY MOUTH 1 HOUR LATER FOR   GOUT FLARE, Disp: 21 tablet, Rfl: 3   fenofibrate (TRICOR) 145 MG tablet, TAKE 1 TABLET BY MOUTH ONCE DAILY, Disp: 90 tablet, Rfl: 3   gabapentin (NEURONTIN) 600 MG tablet, TAKE 1 TABLET BY MOUTH 3  TIMES DAILY, Disp: 270 tablet, Rfl: 3   glucose blood (FREESTYLE LITE) test strip,  Use as instructed, Disp: 100 each, Rfl: 12   hydrochlorothiazide (HYDRODIURIL) 25 MG tablet, TAKE 1 TABLET BY MOUTH  DAILY, Disp: 90 tablet, Rfl: 3   Lancets (ONETOUCH ULTRASOFT) lancets, Use as directed to check sugars once daily and as needed.  Dx E11.43, Disp: 100 each, Rfl: 12   lisinopril (ZESTRIL) 10 MG  tablet, TAKE 1 TABLET BY MOUTH  DAILY, Disp: 90 tablet, Rfl: 3   meloxicam (MOBIC) 15 MG tablet, Take 1 tablet (15 mg total) by mouth daily., Disp: 30 tablet, Rfl: 0   metFORMIN (GLUCOPHAGE) 500 MG tablet, TAKE 1 TABLET BY MOUTH  TWICE DAILY WITH MEALS, Disp: 180 tablet, Rfl: 3   rosuvastatin (CRESTOR) 5 MG tablet, TAKE 1 TABLET BY MOUTH  TWICE WEEKLY, Disp: 26 tablet, Rfl: 2   vitamin B-12 (CYANOCOBALAMIN) 500 MCG tablet, Take 1,500 mcg by mouth daily., Disp: , Rfl:    Objective:     Vitals:   10/28/21 1316  BP: 120/80  Pulse: 60  SpO2: 98%  Weight: 158 lb (71.7 kg)  Height: 5\' 6"  (1.676 m)      Body mass index is 25.5 kg/m.    Physical Exam:    Gen: Appears well, nad, nontoxic and pleasant Psych: Alert and oriented, appropriate mood and affect Neuro: Decreased sensation over left lateral and anterior thigh compared to right, otherwise sensation intact, strength is 5/5 in upper and lower extremities, muscle tone wnl Skin: no susupicious lesions or rashes  Back - Normal skin, Spine with normal alignment and no deformity.   Mild tenderness to vertebral process palpation.   Lumbar paraspinous muscles are significantly tender and without spasm Straight leg raise positive left     Electronically signed by:  [name] [name] 1:49 PM 10/28/21

## 2021-10-28 ENCOUNTER — Ambulatory Visit (INDEPENDENT_AMBULATORY_CARE_PROVIDER_SITE_OTHER): Payer: Medicare Other | Admitting: Sports Medicine

## 2021-10-28 VITALS — BP 120/80 | HR 60 | Ht 66.0 in | Wt 158.0 lb

## 2021-10-28 DIAGNOSIS — M5136 Other intervertebral disc degeneration, lumbar region: Secondary | ICD-10-CM | POA: Diagnosis not present

## 2021-10-28 DIAGNOSIS — M545 Low back pain, unspecified: Secondary | ICD-10-CM

## 2021-10-28 DIAGNOSIS — M51369 Other intervertebral disc degeneration, lumbar region without mention of lumbar back pain or lower extremity pain: Secondary | ICD-10-CM

## 2021-10-28 DIAGNOSIS — G8929 Other chronic pain: Secondary | ICD-10-CM | POA: Diagnosis not present

## 2021-10-28 DIAGNOSIS — M5442 Lumbago with sciatica, left side: Secondary | ICD-10-CM

## 2021-10-28 NOTE — Patient Instructions (Addendum)
Good to see you  Epidural referral  Tylenol [phone] mg 2-3 times a day for pain relief  Follow up 2 weeks after your injection to discuss results

## 2021-10-30 ENCOUNTER — Telehealth: Payer: Self-pay | Admitting: Sports Medicine

## 2021-10-30 DIAGNOSIS — M545 Low back pain, unspecified: Secondary | ICD-10-CM

## 2021-10-30 MED ORDER — MELOXICAM 15 MG PO TABS: 15.0000 mg | ORAL_TABLET | Freq: Every day | ORAL | 0 refills | Status: DC | PRN

## 2021-10-30 NOTE — Telephone Encounter (Signed)
Pt requesting a refill of Meloxicam. Tylenol is not helping enough and pt is getting ready to travel.   She is aware to take sparingly and keep hydrated because of kidney function concerns.  [facility]

## 2021-11-06 ENCOUNTER — Ambulatory Visit
Admission: RE | Admit: 2021-11-06 | Discharge: 2021-11-06 | Disposition: A | Discharge status: Home / Self Care | Payer: Medicare Other | Source: Ambulatory Visit | Attending: Sports Medicine | Admitting: Sports Medicine

## 2021-11-06 DIAGNOSIS — G8929 Other chronic pain: Secondary | ICD-10-CM

## 2021-11-06 DIAGNOSIS — M545 Low back pain, unspecified: Secondary | ICD-10-CM

## 2021-11-06 DIAGNOSIS — M5126 Other intervertebral disc displacement, lumbar region: Secondary | ICD-10-CM | POA: Diagnosis not present

## 2021-11-06 DIAGNOSIS — M5136 Other intervertebral disc degeneration, lumbar region: Secondary | ICD-10-CM

## 2021-11-06 DIAGNOSIS — M47817 Spondylosis without myelopathy or radiculopathy, lumbosacral region: Secondary | ICD-10-CM | POA: Diagnosis not present

## 2021-11-06 MED ORDER — IOPAMIDOL (ISOVUE-M 200) INJECTION 41%
1.0000 mL | Freq: Once | INTRAMUSCULAR | Status: AC
  Administered 2021-11-06: 1 mL via EPIDURAL

## 2021-11-06 MED ORDER — METHYLPREDNISOLONE ACETATE 40 MG/ML INJ SUSP (RADIOLOG
80.0000 mg | Freq: Once | INTRAMUSCULAR | Status: AC
  Administered 2021-11-06: 80 mg via EPIDURAL

## 2021-11-06 NOTE — Discharge Instructions (Signed)

## 2021-11-15 DIAGNOSIS — I959 Hypotension, unspecified: Secondary | ICD-10-CM | POA: Diagnosis not present

## 2021-11-15 DIAGNOSIS — L299 Pruritus, unspecified: Secondary | ICD-10-CM | POA: Diagnosis not present

## 2021-12-03 ENCOUNTER — Telehealth: Payer: Medicare Other

## 2022-01-02 ENCOUNTER — Other Ambulatory Visit: Payer: Self-pay | Admitting: Internal Medicine

## 2022-01-14 ENCOUNTER — Other Ambulatory Visit: Payer: Self-pay | Admitting: Internal Medicine

## 2022-01-14 DIAGNOSIS — F419 Anxiety disorder, unspecified: Secondary | ICD-10-CM

## 2022-01-15 NOTE — Telephone Encounter (Signed)
Error

## 2022-02-14 ENCOUNTER — Other Ambulatory Visit: Payer: Self-pay | Admitting: Internal Medicine

## 2022-03-04 ENCOUNTER — Telehealth: Payer: Medicare Other

## 2022-03-12 ENCOUNTER — Ambulatory Visit (INDEPENDENT_AMBULATORY_CARE_PROVIDER_SITE_OTHER): Payer: Medicare Other

## 2022-03-12 ENCOUNTER — Ambulatory Visit (INDEPENDENT_AMBULATORY_CARE_PROVIDER_SITE_OTHER): Payer: Medicare Other | Admitting: Family Medicine

## 2022-03-12 ENCOUNTER — Telehealth: Payer: Self-pay

## 2022-03-12 ENCOUNTER — Encounter: Payer: Self-pay | Admitting: Family Medicine

## 2022-03-12 VITALS — BP 110/60 | HR 75 | Temp 97.6°F | Ht 66.0 in | Wt 161.0 lb

## 2022-03-12 DIAGNOSIS — W19XXXA Unspecified fall, initial encounter: Secondary | ICD-10-CM

## 2022-03-12 DIAGNOSIS — S80811A Abrasion, right lower leg, initial encounter: Secondary | ICD-10-CM | POA: Diagnosis not present

## 2022-03-12 DIAGNOSIS — R0781 Pleurodynia: Secondary | ICD-10-CM | POA: Diagnosis not present

## 2022-03-12 DIAGNOSIS — Z23 Encounter for immunization: Secondary | ICD-10-CM

## 2022-03-12 DIAGNOSIS — S4991XA Unspecified injury of right shoulder and upper arm, initial encounter: Secondary | ICD-10-CM

## 2022-03-12 DIAGNOSIS — R0789 Other chest pain: Secondary | ICD-10-CM

## 2022-03-12 DIAGNOSIS — M19011 Primary osteoarthritis, right shoulder: Secondary | ICD-10-CM | POA: Diagnosis not present

## 2022-03-12 DIAGNOSIS — S0003XA Contusion of scalp, initial encounter: Secondary | ICD-10-CM

## 2022-03-12 MED ORDER — MELOXICAM 15 MG PO TABS
15.0000 mg | ORAL_TABLET | Freq: Every day | ORAL | 0 refills | Status: DC | PRN
Start: 2022-03-12 — End: 2022-10-14

## 2022-03-12 NOTE — Progress Notes (Signed)
No rib fracture on X ray and her lungs are normal. She will be sore from the fall for several days but should start to improve soon. Follow up if any concerns.

## 2022-03-12 NOTE — Telephone Encounter (Signed)
FYI: Spoke with the pt and she has stated she had a fall last night 03/11/2022 and is experiencing pain on the rt side of her body... She has stated where her ribs/breast is on the rt side it hurts to take in breathes. Pt states she did not hit her head, pass out or anything.  *I advised pt to be seen at [facility] or ED pt has denied to going due to the wait time being 32+ hours and it is hard to sit up for a long period of time.  **Pt states she wants to be seen by her provider so she will come in tomorrow morning as she was informed provider schedule was booked up for today. I also informed the pt that if she is experiencing any sob, chest pain, dizziness, nausea, vomiting, syncope, or any worsening sxs call 911 ASAP.

## 2022-03-12 NOTE — Telephone Encounter (Signed)
FYI  I was able to r/s pt to come in today at 4:20 pm to see Vickie for her fall.

## 2022-03-12 NOTE — Patient Instructions (Signed)
Please go downstairs for x-rays before you leave today.  You may take Tylenol for pain.  Use ice or heat.  You may also use a topical pain medicine over-the-counter such as Salonpas or Biofreeze.  I refilled meloxicam which you may take as needed as well.  If you develop a headache, vision changes, confusion or any concerning symptoms then you will need to call 911 or go to the emergency department for further evaluation since you hit your head last night.

## 2022-03-12 NOTE — Progress Notes (Signed)
Her shoulder XR shows mild arthritis but no acute fracture. Her pain should continue to improve.

## 2022-03-12 NOTE — Progress Notes (Signed)
Subjective:     Patient ID: [name], female    DOB: 05-10-1955, 66 y.o.   MRN: [id number]  Chief Complaint  Patient presents with   Fall    Pt had fall outside on sidewalk on 11/28 and wasn't able to catch herself and now is experiencing right sided body pains with pains in her right rib and breast area. Reports every time she breathes it hurts, is not able to sleep    HPI Patient is in today for fall last night while tripping on her sidewalk landing on her right side. Denies feeling dizzy, having chest pain or shortness of breath. States she hit the right side of her head but did not lose consciousness. No vision changes. States she had a headache last night but none today.  Denies neck or back pain.  She is not on anticoagulant.   Right shoulder pain and right rib and breast pain.   Scraped her right knee.   Denies fever, chills, dizziness, chest pain, palpitations, shortness of breath, abdominal pain, N/V/D, urinary symptoms, LE edema.   Health Maintenance Due  Topic Date Due   Zoster Vaccines- Shingrix (1 of 2) Never done   PAP SMEAR-Modifier  12/12/2013   Diabetic kidney evaluation - Urine ACR  12/01/2017   COVID-19 Vaccine (3 - Pfizer risk series) 08/25/2019   OPHTHALMOLOGY EXAM  08/12/2021   INFLUENZA VACCINE  11/12/2021   Medicare Annual Wellness (AWV)  03/22/2022    Past Medical History:  Diagnosis Date   Adenomatous colon polyp    Anemia, unspecified    Cocaine abuse ([place])    as recently as 10/21/11   Depression    Difficulty sleeping    takes meds to sleep   Diverticulosis    Hernia 03/18/2013   History of transfusion    Hypertension    Incontinence of bowel    since colostomy   Knee fracture, left    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   Nonspecific colitis 10/10/11   Radiation proctitis    mild   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   LAR surg 02/2009, chemo thru 09/2009   Tibial plateau fracture, left 01/19/2014    Past Surgical  History:  Procedure Laterality Date   APPENDECTOMY N/A 01/09/2015   Procedure: APPENDECTOMY;  Surgeon: Excell Seltzer, MD;  Location: [facility];  Service: General;  Laterality: N/A;   COLONOSCOPY  01/09/2015   [facility]   COLOSTOMY  2013   [facility]   FINGER FRACTURE SURGERY Left    and hand surgery   HERNIA REPAIR     LAPAROSCOPIC PARASTOMAL HERNIA N/A 05/12/2013   Procedure: LAPAROSCOPIC PARASTOMAL HERNIA;  Surgeon: Leighton Ruff, MD;  Location: [facility];  Service: General;  Laterality: N/A;   LAPAROTOMY N/A 01/09/2015   Procedure: EXPLORATORY LAPAROTOMY;  Surgeon: Excell Seltzer, MD;  Location: [facility];  Service: General;  Laterality: N/A;  RESECTION OF COLOSTOMY, CREATION OF NEW COLOSTOMY, VENTRAL HERNIA REPAIR WITH BIOLOGIC MESH,  LYSIS OF ADHESIONS , OMENTECTOMY   LOW ANTERIOR BOWEL RESECTION  03/07/09   MOUTH SURGERY  10/2009   PORTACATH PLACEMENT     and removal    Family History  Problem Relation Age of Onset   Colon cancer Mother 48   Diabetes Father    Cirrhosis Brother    Stomach cancer Sister    Colon polyps Sister    Pulmonary Hypertension Brother    Esophageal cancer Neg Hx    Rectal cancer Neg Hx  Social History   Socioeconomic History   Marital status: Significant Other    Spouse name: Not on file   Number of children: 0   Years of education: Not on file   Highest education level: Not on file  Occupational History   Occupation: disabiled  Tobacco Use   Smoking status: Every Day    Packs/day: 0.25    Years: 40.00    Total pack years: 10.00    Types: Cigarettes   Smokeless tobacco: Never   Tobacco comments:    Pt. in project free program, motivated to stop smoking completely. Declined smoking cessation cone program.  Vaping Use   Vaping Use: Never used  Substance and Sexual Activity   Alcohol use: Yes    Alcohol/week: 18.0 standard drinks of alcohol    Types: 14 Cans of beer, 4 Glasses of wine per week   Drug use: Yes    Frequency: 1.0 times per  week    Types: Marijuana, Cocaine    Comment: Pt is using Marijuana; last use of cocaine nov 2014    Sexual activity: Not on file  Other Topics Concern   Not on file  Social History Narrative   Divorced, has significant other for past 30 years; values spending time with 2 grandsons and 65 year old dog who lives with significant other   Frequent moves between family with stressors in relationship   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, jewellery, Psychologist, 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, 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   Social Determinants of Health   Financial Resource Strain: [place]  (03/22/2021)   Overall Financial Resource Strain (CARDIA)    Difficulty of Paying Living Expenses: Not hard at all  Food Insecurity: No Food Insecurity (03/22/2021)   Hunger Vital Sign    Worried About Running Out of Food in the Last Year: Never true    [place] in the Last Year: Never true  Transportation Needs: No Transportation Needs (03/22/2021)   PRAPARE - Hydrologist (Medical): No    Lack of Transportation (Non-Medical): No  Physical Activity: Insufficiently Active (03/22/2021)   Exercise Vital Sign    Days of Exercise per Week: 3 days    Minutes of Exercise per Session: 30 min  Stress: No Stress Concern Present (03/22/2021)   [place]    Feeling of Stress : Not at all  Social Connections: Moderately Isolated (03/22/2021)   Social Connection and Isolation Panel [NHANES]    Frequency of Communication with Friends and Family: Twice a week    Frequency of Social Gatherings with Friends and Family: Twice a week    Attends Religious Services: More than 4 times per year    Active Member of [employer] or Organizations: No    Attends Archivist Meetings: Never    Marital Status: Divorced  Human resources officer Violence: Not At Risk (03/22/2021)   Humiliation, Afraid, Rape,  and Kick questionnaire    Fear of Current or Ex-Partner: No    Emotionally Abused: No    Physically Abused: No    Sexually Abused: No    Outpatient Medications Prior to Visit  Medication Sig Dispense Refill   ALPRAZolam (XANAX) 0.5 MG tablet TAKE 1 TABLET BY MOUTH TWICE  DAILY AS NEEDED FOR ANXIETY 180 tablet 0   Blood Glucose Monitoring Suppl (ACCU-CHEK GUIDE) w/Device KIT USE TO CHECK BLOOD SUGAR  ONCE DAILY AND AS NEEDED 1 kit 2   colchicine 0.6 MG tablet TAKE 2 TABLETS BY MOUTH FOR ONE DOSE AND THEN 1 TABLET  BY MOUTH 1 HOUR LATER FOR   GOUT FLARE 21 tablet 3   fenofibrate (TRICOR) 145 MG tablet TAKE 1 TABLET BY MOUTH ONCE DAILY 90 tablet 3   gabapentin (NEURONTIN) 600 MG tablet TAKE 1 TABLET BY MOUTH 3  TIMES DAILY 270 tablet 3   glucose blood (FREESTYLE LITE) test strip Use as instructed 100 each 12   hydrochlorothiazide (HYDRODIURIL) 25 MG tablet TAKE 1 TABLET BY MOUTH  DAILY 90 tablet 3   Lancets (ONETOUCH ULTRASOFT) lancets Use as directed to check sugars once daily and as needed.  Dx E11.43 100 each 12   lisinopril (ZESTRIL) 10 MG tablet TAKE 1 TABLET BY MOUTH DAILY 100 tablet 2   metFORMIN (GLUCOPHAGE) 500 MG tablet TAKE 1 TABLET BY MOUTH  TWICE DAILY WITH MEALS 180 tablet 3   rosuvastatin (CRESTOR) 5 MG tablet TAKE 1 TABLET BY MOUTH TWICE  WEEKLY 29 tablet 2   vitamin B-12 (CYANOCOBALAMIN) 500 MCG tablet Take 1,500 mcg by mouth daily.     meloxicam (MOBIC) 15 MG tablet Take 1 tablet (15 mg total) by mouth daily as needed for pain. (Patient not taking: Reported on 03/12/2022) 30 tablet 0   No facility-administered medications prior to visit.    Allergies  Allergen Reactions   Ampicillin Hives   Hydrocodone Itching    Can take Oxycodone and Codeine   Penicillins Other (See Comments)    Unknown reaction   Grapeseed Extract [Nutritional Supplements] Rash    ROS     Objective:    Physical Exam Constitutional:      General: She is not in acute distress.     Appearance: She is not ill-appearing.  HENT:     Head:      Comments: Small hematoma and TTP of right forehead above right eyebrow.  Eyes:     General: Lids are normal. Vision grossly intact.     Extraocular Movements: Extraocular movements intact.     Conjunctiva/sclera: Conjunctivae normal.     Pupils: Pupils are equal, round, and reactive to light.  Cardiovascular:     Rate and Rhythm: Normal rate and regular rhythm.  Pulmonary:     Effort: Pulmonary effort is normal.     Breath sounds: Normal breath sounds.  Chest:     Chest wall: Tenderness present. No lacerations, deformity or crepitus.       Comments: Right lateral and anterior rib TTP along with right upper chest wall.  Abdominal:     General: Bowel sounds are normal.     Palpations: Abdomen is soft.     Tenderness: There is no abdominal tenderness.  Musculoskeletal:     Right shoulder: Tenderness present. No crepitus. Normal range of motion. Normal strength. Normal pulse.     Cervical back: Normal, normal range of motion and neck supple. No tenderness. No spinous process tenderness.     Thoracic back: Normal.     Lumbar back: Normal.     Right hip: Normal.     Left hip: Normal.     Right lower leg: No edema.     Left lower leg: No edema.     Comments: Superficial abrasion with scab to lateral right knee.   Lymphadenopathy:     Cervical: No cervical adenopathy.  Skin:    General: Skin is warm and dry.  Neurological:  General: No focal deficit present.     Mental Status: She is alert and oriented to person, place, and time.     Sensory: Sensation is intact.     Motor: Motor function is intact.     Coordination: Coordination is intact.     Gait: Gait is intact.  Psychiatric:        Attention and Perception: Attention normal.        Mood and Affect: Mood normal.        Speech: Speech normal.        Behavior: Behavior normal.     BP 110/60 (BP Location: Left Arm, Patient Position: Sitting, Cuff Size: Large)    Pulse 75   Temp 97.6 F (36.4 C) (Temporal)   Ht _0  (1.676 m)   Wt 161 lb (73 kg)   SpO2 98%   BMI 25.99 kg/m  Wt Readings from Last 3 Encounters:  03/12/22 161 lb (73 kg)  10/28/21 158 lb (71.7 kg)  10/11/21 160 lb (72.6 kg)       Assessment & Plan:   Problem List Items Addressed This Visit   None Visit Diagnoses     Fall, initial encounter    -  Primary   Relevant Orders   DG Ribs Unilateral W/Chest Right (Completed)   DG Shoulder Right (Completed)   Injury of right shoulder, initial encounter       Relevant Medications   meloxicam (MOBIC) 15 MG tablet   Other Relevant Orders   DG Shoulder Right (Completed)   Tenderness of chest wall       Relevant Orders   DG Ribs Unilateral W/Chest Right (Completed)   Rib pain on right side       Relevant Medications   meloxicam (MOBIC) 15 MG tablet   Other Relevant Orders   DG Ribs Unilateral W/Chest Right (Completed)   Abrasion of right lower extremity, initial encounter       Relevant Orders   Td : Tetanus/diphtheria >7yo Preservative  free (Completed)   Scalp hematoma, initial encounter       Need for Td vaccine       Relevant Orders   Td : Tetanus/diphtheria >7yo Preservative  free (Completed)      Mechanical fall last night. No LOC. Neuro exam is benign.  X rays ordered of her right shoulder, right ribs with chest wall.  Discussed conservative treatment using ice or heat, topical analgesic and Tylenol or NSAID prn. Refilled meloxicam.  Discussed splinting ribs when coughing.  Follow up pending X ray results.  Strict precautions that if she develops headache, vision changes, confusion, vomiting or any other new symptoms that she will call 911 or go to the ED.   I am having [name] "Montanna Phillip Heal" maintain her FREESTYLE LITE, onetouch ultrasoft, Accu-Chek Guide, hydrochlorothiazide, metFORMIN, vitamin B-12, colchicine, fenofibrate, gabapentin, lisinopril, ALPRAZolam, rosuvastatin, and  meloxicam.  Meds ordered this encounter  Medications   meloxicam (MOBIC) 15 MG tablet    Sig: Take 1 tablet (15 mg total) by mouth daily as needed for pain.    Dispense:  30 tablet    Refill:  0    Order Specific Question:   Supervising Provider    Answer:   Pricilla Holm A [[id number]]

## 2022-03-13 ENCOUNTER — Ambulatory Visit: Payer: Medicare Other | Admitting: Internal Medicine

## 2022-03-24 ENCOUNTER — Ambulatory Visit (INDEPENDENT_AMBULATORY_CARE_PROVIDER_SITE_OTHER): Payer: Medicare Other

## 2022-03-24 VITALS — Ht 66.0 in | Wt 161.0 lb

## 2022-03-24 DIAGNOSIS — Z0000 Encounter for general adult medical examination without abnormal findings: Secondary | ICD-10-CM

## 2022-03-24 DIAGNOSIS — Z1239 Encounter for other screening for malignant neoplasm of breast: Secondary | ICD-10-CM | POA: Diagnosis not present

## 2022-03-24 DIAGNOSIS — Z135 Encounter for screening for eye and ear disorders: Secondary | ICD-10-CM

## 2022-03-24 NOTE — Progress Notes (Signed)
Virtual Visit via Telephone Note  I connected with  [name] on 03/24/22 at 11:30 AM EST by telephone and verified that I am speaking with the correct person using two identifiers.  Location: Patient: Home Provider: [place] Persons participating in the virtual visit: [place]   I discussed the limitations, risks, security and privacy concerns of performing an evaluation and management service by telephone and the availability of in person appointments. The patient expressed understanding and agreed to proceed.  Interactive audio and video telecommunications were attempted between this nurse and patient, however failed, due to patient having technical difficulties OR patient did not have access to video capability.  We continued and completed visit with audio only.  Some vital signs may be absent or patient reported.   Sheral Flow, LPN  Subjective:   [name] is a 66 y.o. female who presents for Medicare Annual (Subsequent) preventive examination.  Review of Systems     Cardiac Risk Factors include: advanced age (>69mn, >>41women);diabetes mellitus;dyslipidemia;hypertension     Objective:    Today's Vitals   03/24/22 1132  Weight: 161 lb (73 kg)  Height: _0  (1.676 m)  PainSc: 0-No pain   Body mass index is 25.99 kg/m.     03/24/2022   11:35 AM 03/22/2021    8:25 AM 11/10/2020   11:02 AM 01/02/2020   10:24 AM 03/25/2017   10:44 AM 10/20/2016    1:54 PM 01/10/2016    9:34 AM  Advanced Directives  Does Patient Have a Medical Advance Directive? _1  No No  Would patient like information on creating a medical advance directive? No - Patient declined No - Patient declined  No - Patient declined No - Patient declined  No - patient declined information    Current Medications (verified) Outpatient Encounter Medications as of 03/24/2022  Medication Sig   ALPRAZolam (XANAX) 0.5 MG tablet TAKE 1  TABLET BY MOUTH TWICE  DAILY AS NEEDED FOR ANXIETY   Blood Glucose Monitoring Suppl (ACCU-CHEK GUIDE) w/Device KIT USE TO CHECK BLOOD SUGAR  ONCE DAILY AND AS NEEDED   colchicine 0.6 MG tablet TAKE 2 TABLETS BY MOUTH FOR ONE DOSE AND THEN 1 TABLET  BY MOUTH 1 HOUR LATER FOR   GOUT FLARE   fenofibrate (TRICOR) 145 MG tablet TAKE 1 TABLET BY MOUTH ONCE DAILY   gabapentin (NEURONTIN) 600 MG tablet TAKE 1 TABLET BY MOUTH 3  TIMES DAILY   glucose blood (FREESTYLE LITE) test strip Use as instructed   hydrochlorothiazide (HYDRODIURIL) 25 MG tablet TAKE 1 TABLET BY MOUTH  DAILY   Lancets (ONETOUCH ULTRASOFT) lancets Use as directed to check sugars once daily and as needed.  Dx E11.43   lisinopril (ZESTRIL) 10 MG tablet TAKE 1 TABLET BY MOUTH DAILY   meloxicam (MOBIC) 15 MG tablet Take 1 tablet (15 mg total) by mouth daily as needed for pain.   metFORMIN (GLUCOPHAGE) 500 MG tablet TAKE 1 TABLET BY MOUTH  TWICE DAILY WITH MEALS   rosuvastatin (CRESTOR) 5 MG tablet TAKE 1 TABLET BY MOUTH TWICE  WEEKLY   vitamin B-12 (CYANOCOBALAMIN) 500 MCG tablet Take 1,500 mcg by mouth daily.   No facility-administered encounter medications on file as of 03/24/2022.    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   LAR surg 02/2009, chemo thru 09/2009   Tibial plateau fracture, left 01/19/2014   Past Surgical History:  Procedure Laterality Date    APPENDECTOMY N/A 01/09/2015   Procedure: APPENDECTOMY;  Surgeon: Excell Seltzer, MD;  Location: [facility];  Service: General;  Laterality: N/A;   COLONOSCOPY  01/09/2015   [facility]   COLOSTOMY  2013   [facility]   FINGER FRACTURE SURGERY Left    and hand surgery   HERNIA REPAIR     LAPAROSCOPIC PARASTOMAL HERNIA N/A 05/12/2013   Procedure: LAPAROSCOPIC PARASTOMAL HERNIA;  Surgeon: Leighton Ruff, MD;  Location: [facility];  Service: General;  Laterality: N/A;   LAPAROTOMY N/A 01/09/2015   Procedure: EXPLORATORY LAPAROTOMY;  Surgeon: Excell Seltzer, MD;  Location: [facility];  Service: General;  Laterality: N/A;  RESECTION OF COLOSTOMY, CREATION OF NEW COLOSTOMY, VENTRAL HERNIA REPAIR WITH BIOLOGIC MESH,  LYSIS OF ADHESIONS , OMENTECTOMY   LOW ANTERIOR BOWEL RESECTION  03/07/09   MOUTH SURGERY  10/2009   PORTACATH PLACEMENT     and removal   Family History  Problem Relation Age of Onset   Colon cancer Mother 48   Diabetes Father    Cirrhosis Brother    Stomach cancer Sister    Colon polyps Sister    Pulmonary Hypertension Brother    Esophageal cancer Neg Hx    Rectal cancer Neg Hx    Social History   Socioeconomic History   Marital status: Significant Other    Spouse name: Not on file   Number of children: 0   Years of education: Not on file   Highest education level: Not on file  Occupational History   Occupation: disabiled  Tobacco Use   Smoking status: Every Day    Packs/day: 0.25    Years: 40.00    Total pack years: 10.00    Types: Cigarettes   Smokeless tobacco: Never   Tobacco comments:    Pt. in project free program, motivated to stop smoking completely. Declined smoking cessation [facility] program.  Vaping Use   Vaping Use: Never used  Substance and Sexual Activity   Alcohol use: Yes    Alcohol/week: 18.0 standard drinks of alcohol    Types: 14 Cans of beer, 4 Glasses of wine per week   Drug use: Yes    Frequency: 1.0 times per week    Types: Marijuana, Cocaine     Comment: Pt is using Marijuana; last use of cocaine nov 2014    Sexual activity: Not on file  Other Topics Concern   Not on file  Social History Narrative   Divorced, has significant other for past 30 years; values spending time with 2 grandsons and 5 year old dog who lives with significant other   Frequent moves between family with stressors in relationship   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, jewellery, Psychologist, 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, 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   Social Determinants of Health   Financial Resource Strain: [place]  (  03/24/2022)   Overall Financial Resource Strain (CARDIA)    Difficulty of Paying Living Expenses: Not hard at all  Food Insecurity: No Food Insecurity (03/24/2022)   Hunger Vital Sign    Worried About Running Out of Food in the Last Year: Never true    Ran Out of Food in the Last Year: Never true  Transportation Needs: No Transportation Needs (03/24/2022)   PRAPARE - Hydrologist (Medical): No    Lack of Transportation (Non-Medical): No  Physical Activity: Sufficiently Active (03/24/2022)   Exercise Vital Sign    Days of Exercise per Week: 5 days    Minutes of Exercise per Session: 30 min  Stress: No Stress Concern Present (03/24/2022)   [place]    Feeling of Stress : Not at all  Social Connections: Moderately Isolated (03/24/2022)   Social Connection and Isolation Panel [NHANES]    Frequency of Communication with Friends and Family: Twice a week    Frequency of Social Gatherings with Friends and Family: Twice a week    Attends Religious Services: More than 4 times per year    Active Member of [employer] or Organizations: No    Attends Archivist Meetings: Never    Marital Status: Divorced    Tobacco Counseling Ready to quit: Not Answered Counseling given: Not Answered Tobacco comments: Pt. in  project free program, motivated to stop smoking completely. Declined smoking cessation [facility] program.   Clinical Intake:  Pre-visit preparation completed: Yes  Pain : No/denies pain Pain Score: 0-No pain     BMI - recorded: 25.99 Nutritional Status: BMI 25 -29 Overweight Nutritional Risks: None Diabetes: Yes CBG done?: No Did pt. bring in CBG monitor from home?: No  How often do you need to have someone help you when you read instructions, pamphlets, or other written materials from your doctor or pharmacy?: 1 - Never What is the last grade level you completed in school?: Graduate school courses  Nutrition Risk Assessment:  Has the patient had any N/V/D within the last 2 months?  No  Does the patient have any non-healing wounds?  No  Has the patient had any unintentional weight loss or weight gain?  No   Diabetes:  Is the patient diabetic?  Yes  If diabetic, was a CBG obtained today?  No  Did the patient bring in their glucometer from home?  No  How often do you monitor your CBG's? Once daily as needed.   Financial Strains and Diabetes Management:  Are you having any financial strains with the device, your supplies or your medication? No .  Does the patient want to be seen by Chronic Care Management for management of their diabetes?  No  Would the patient like to be referred to a Nutritionist or for Diabetic Management?  No   Diabetic Exams:  Diabetic Eye Exam: Overdue for diabetic eye exam. Pt has been advised about the importance in completing this exam. Patient advised to call and schedule an eye exam. Diabetic Foot Exam: Completed 04/04/2021   Interpreter Needed?: No  Information entered by :: Lisette Abu, LPN.   Activities of Daily Living    03/24/2022   12:05 PM  In your present state of health, do you have any difficulty performing the following activities:  Hearing? 0  Vision? 0  Difficulty concentrating or making decisions? 0  Walking or  climbing stairs? 0  Dressing or bathing? 0  Doing errands, shopping? 0  Preparing Food and eating ? N  Using the Toilet? N  In the past six months, have you accidently leaked urine? Y  Do you have problems with loss of bowel control? Y  Managing your Medications? N  Managing your Finances? N  Housekeeping or managing your Housekeeping? N    Patient Care Team: Binnie Rail, MD as PCP - General (Internal Medicine) Nunzio Cobbs, MD (Obstetrics and Gynecology) Jana Half, DPM (Podiatry) Heath Lark, MD as Consulting Physician (Hematology and Oncology) Leighton Ruff, MD (General Surgery) [facility], Carlota Raspberry, MD as Consulting Physician (Gastroenterology) Szabat, Darnelle Maffucci, [facility] (Inactive) as Pharmacist (Pharmacist)  Indicate any recent Medical Services you may have received from other than [facility] providers in the past year (date may be approximate).     Assessment:   This is a routine wellness examination for [name].  Hearing/Vision screen Hearing Screening - Comments:: Denies hearing difficulties   Vision Screening - Comments:: Wears readers - not up to date with routine eye exam.   Dietary issues and exercise activities discussed: Current Exercise Habits: Home exercise routine, Type of exercise: walking, Time (Minutes): 30, Frequency (Times/Week): 5, Weekly Exercise (Minutes/Week): 150, Intensity: Moderate, Exercise limited by: None identified   Goals Addressed             This Visit's Progress    DIET - INCREASE WATER INTAKE       Monitor and Manage My Blood Sugar-Diabetes Type 2   On track    Timeframe:  Long-Range Goal Priority:  High Start Date:  09/20/2020                           Expected End Date:  03/28/2022                     Follow Up Date 10/2021   - check blood sugar at prescribed times - check blood sugar if I feel it is too high or too low - enter blood sugar readings and medication or insulin into daily log - take the blood sugar log  to all doctor visits    Why is this important?   Checking your blood sugar at home helps to keep it from getting very high or very low.  Writing the results in a diary or log helps the doctor know how to care for you.  Your blood sugar log should have the time, date and the results.  Also, write down the amount of insulin or other medicine that you take.  Other information, like what you ate, exercise done and how you were feeling, will also be helpful.     Notes: Patient to reach out should blood sugars elevate out of goal range (<150) or should she experience more frequent hypoglycemic episodes       Depression Screen    03/24/2022   11:55 AM 10/01/2021    7:55 AM 03/22/2021    8:32 AM 10/03/2020   11:23 AM 01/02/2020   10:19 AM 11/21/2019   10:04 AM 04/20/2018    8:17 AM  PHQ 2/9 Scores  PHQ - 2 Score 0 1 0 0 1 0 0  PHQ- 9 Score  7     2    Fall Risk    03/24/2022   11:36 AM 10/01/2021    7:56 AM 03/22/2021    8:27 AM 10/03/2020   11:23 AM 01/02/2020  10:25 AM  Fall Risk   Falls in the past year? 1 1 0 0 1  Number falls in past yr: 0 1 0 0 1  Injury with Fall? 1 0 0 0 0  Risk for fall due to :     Impaired balance/gait;History of fall(s);Medication side effect;Orthopedic patient  Follow up Falls evaluation completed  Falls evaluation completed  Falls evaluation completed;Education provided    FALL RISK PREVENTION PERTAINING TO THE HOME:  Any stairs in or around the home? No  If so, are there any without handrails? No  Home free of loose throw rugs in walkways, pet beds, electrical cords, etc? Yes  Adequate lighting in your home to reduce risk of falls? Yes   ASSISTIVE DEVICES UTILIZED TO PREVENT FALLS:  Life alert? No  Use of a cane, walker or w/c? No  Grab bars in the bathroom? Yes  Shower chair or bench in shower? No  Elevated toilet seat or a handicapped toilet? No   TIMED UP AND GO:  Was the test performed? No . Phone Visit   Cognitive Function:         03/24/2022   12:05 PM 01/02/2020   10:28 AM  6CIT Screen  What Year? 0 points 0 points  What month? 0 points 0 points  What time? 0 points 0 points  Count back from 20 0 points 0 points  Months in reverse 0 points 0 points  Repeat phrase 0 points 0 points  Total Score 0 points 0 points    Immunizations Immunization History  Administered Date(s) Administered   Influenza Whole 01/02/2020   Influenza,inj,Quad PF,6+ Mos 03/18/2013, 05/23/2014, 01/10/2016, 04/03/2017, 12/09/2017, 12/13/2018, 03/27/2021   PFIZER(Purple Top)SARS-COV-2 Vaccination 07/07/2019, 07/28/2019   PNEUMOCOCCAL CONJUGATE-20 10/01/2021   Pneumococcal Conjugate-13 05/23/2014   Pneumococcal Polysaccharide-23 06/12/2016   Td 05/01/2010, 03/12/2022    TDAP status: Up to date  Flu Vaccine status: Due, Education has been provided regarding the importance of this vaccine. Advised may receive this vaccine at local pharmacy or Health Dept. Aware to provide a copy of the vaccination record if obtained from local pharmacy or Health Dept. Verbalized acceptance and understanding.  Pneumococcal vaccine status: Up to date  Covid-19 vaccine status: Completed vaccines  Qualifies for Shingles Vaccine? Yes   Zostavax completed No   Shingrix Completed?: No.    Education has been provided regarding the importance of this vaccine. Patient has been advised to call insurance company to determine out of pocket expense if they have not yet received this vaccine. Advised may also receive vaccine at local pharmacy or Health Dept. Verbalized acceptance and understanding.  Screening Tests Health Maintenance  Topic Date Due   Zoster Vaccines- Shingrix (1 of 2) Never done   PAP SMEAR-Modifier  12/12/2013   Diabetic kidney evaluation - Urine ACR  12/01/2017   COVID-19 Vaccine (3 - Pfizer risk series) 08/25/2019   OPHTHALMOLOGY EXAM  08/12/2021   INFLUENZA VACCINE  11/12/2021   MAMMOGRAM  10/02/2022 (Originally 06/12/2021)   HEMOGLOBIN A1C   04/02/2022   FOOT EXAM  04/04/2022   Diabetic kidney evaluation - eGFR measurement  10/02/2022   Medicare Annual Wellness (AWV)  03/25/2023   DEXA SCAN  08/28/2024   COLONOSCOPY (Pts 45-5yr Insurance coverage will need to be confirmed)  03/01/2025   DTaP/Tdap/Td (3 - Tdap) 03/12/2032   Pneumonia Vaccine 65 Years old  Completed   Hepatitis C Screening  Completed   HIV Screening  Completed   HPV VACCINES  Aged  Out    Health Maintenance  Health Maintenance Due  Topic Date Due   Zoster Vaccines- Shingrix (1 of 2) Never done   PAP SMEAR-Modifier  12/12/2013   Diabetic kidney evaluation - Urine ACR  12/01/2017   COVID-19 Vaccine (3 - Pfizer risk series) 08/25/2019   OPHTHALMOLOGY EXAM  08/12/2021   INFLUENZA VACCINE  11/12/2021    Colorectal cancer screening: Type of screening: Colonoscopy. Completed 03/01/2020. Repeat every 5 years  Mammogram status: Ordered 03/24/2022. Pt provided with contact info and advised to call to schedule appt.   Bone Density status: Completed 08/28/2021. Results reflect: Bone density results: OSTEOPENIA. Repeat every 3 years.  Lung Cancer Screening: (Low Dose CT Chest recommended if Age 8-80 years, 30 pack-year currently smoking OR have quit w/in 15years.) does not qualify.   Lung Cancer Screening Referral: no  Additional Screening:  Hepatitis C Screening: does qualify; Completed 10/09/2015  Vision Screening: Recommended annual ophthalmology exams for early detection of glaucoma and other disorders of the eye. Is the patient up to date with their annual eye exam?  No  Who is the provider or what is the name of the office in which the patient attends annual eye exams? No Eye doctor If pt is not established with a provider, would they like to be referred to a provider to establish care? Yes .   Dental Screening: Recommended annual dental exams for proper oral hygiene  Community Resource Referral / Chronic Care Management: CRR required this visit?   Yes   CCM required this visit?  No      Plan:     I have personally reviewed and noted the following in the patient's chart:   Medical and social history Use of alcohol, tobacco or illicit drugs  Current medications and supplements including opioid prescriptions. Patient is not currently taking opioid prescriptions. Functional ability and status Nutritional status Physical activity Advanced directives List of other physicians Hospitalizations, surgeries, and ER visits in previous 12 months Vitals Screenings to include cognitive, depression, and falls Referrals and appointments  In addition, I have reviewed and discussed with patient certain preventive protocols, quality metrics, and best practice recommendations. A written personalized care plan for preventive services as well as general preventive health recommendations were provided to patient.     Sheral Flow, LPN   [id number]   Nurse Notes: n/a

## 2022-03-24 NOTE — Patient Instructions (Signed)
[name] , Thank you for taking time to come for your Medicare Wellness Visit. I appreciate your ongoing commitment to your health goals. Please review the following plan we discussed and let me know if I can assist you in the future.   These are the goals we discussed:  Goals      DIET - INCREASE WATER INTAKE     Monitor and Manage My Blood Sugar-Diabetes Type 2     Timeframe:  Long-Range Goal Priority:  High Start Date:  09/20/2020                           Expected End Date:  03/28/2022                     Follow Up Date 10/2021   - check blood sugar at prescribed times - check blood sugar if I feel it is too high or too low - enter blood sugar readings and medication or insulin into daily log - take the blood sugar log to all doctor visits    Why is this important?   Checking your blood sugar at home helps to keep it from getting very high or very low.  Writing the results in a diary or log helps the doctor know how to care for you.  Your blood sugar log should have the time, date and the results.  Also, write down the amount of insulin or other medicine that you take.  Other information, like what you ate, exercise done and how you were feeling, will also be helpful.     Notes: Patient to reach out should blood sugars elevate out of goal range (<150) or should she experience more frequent hypoglycemic episodes         This is a list of the screening recommended for you and due dates:  Health Maintenance  Topic Date Due   Zoster (Shingles) Vaccine (1 of 2) Never done   Pap Smear  12/12/2013   Yearly kidney health urinalysis for diabetes  12/01/2017   COVID-19 Vaccine (3 - Pfizer risk series) 08/25/2019   Eye exam for diabetics  08/12/2021   Flu Shot  11/12/2021   Mammogram  10/02/2022*   Hemoglobin A1C  04/02/2022   Complete foot exam   04/04/2022   Yearly kidney function blood test for diabetes  10/02/2022   Medicare Annual Wellness Visit  03/25/2023    DEXA scan (bone density measurement)  08/28/2024   Colon Cancer Screening  03/01/2025   DTaP/Tdap/Td vaccine (3 - Tdap) 03/12/2032   Pneumonia Vaccine  Completed   Hepatitis C Screening: USPSTF Recommendation to screen - Ages 34-79 yo.  Completed   HIV Screening  Completed   HPV Vaccine  Aged Out  *Topic was postponed. The date shown is not the original due date.    Advanced directives: No  Conditions/risks identified: Yes; Type II Diabetes  Next appointment: Follow up in one year for your annual wellness visit.   Preventive Care 24 Years and Older, Female Preventive care refers to lifestyle choices and visits with your health care provider that can promote health and wellness. What does preventive care include? A yearly physical exam. This is also called an annual well check. Dental exams once or twice a year. Routine eye exams. Ask your health care provider how often you should have your eyes checked. Personal lifestyle choices, including: Daily care of your teeth and gums. Regular physical  activity. Eating a healthy diet. Avoiding tobacco and drug use. Limiting alcohol use. Practicing safe sex. Taking low-dose aspirin every day. Taking vitamin and mineral supplements as recommended by your health care provider. What happens during an annual well check? The services and screenings done by your health care provider during your annual well check will depend on your age, overall health, lifestyle risk factors, and family history of disease. Counseling  Your health care provider may ask you questions about your: Alcohol use. Tobacco use. Drug use. Emotional well-being. Home and relationship well-being. Sexual activity. Eating habits. History of falls. Memory and ability to understand (cognition). Work and work Statistician. Reproductive health. Screening  You may have the following tests or measurements: Height, weight, and BMI. Blood pressure. Lipid and cholesterol  levels. These may be checked every 5 years, or more frequently if you are over 31 years old. Skin check. Lung cancer screening. You may have this screening every year starting at age 56 if you have a 30-pack-year history of smoking and currently smoke or have quit within the past 15 years. Fecal occult blood test (FOBT) of the stool. You may have this test every year starting at age 40. Flexible sigmoidoscopy or colonoscopy. You may have a sigmoidoscopy every 5 years or a colonoscopy every 10 years starting at age 23. Hepatitis C blood test. Hepatitis B blood test. Sexually transmitted disease (STD) testing. Diabetes screening. This is done by checking your blood sugar (glucose) after you have not eaten for a while (fasting). You may have this done every 1-3 years. Bone density scan. This is done to screen for osteoporosis. You may have this done starting at age 50. Mammogram. This may be done every 1-2 years. Talk to your health care provider about how often you should have regular mammograms. Talk with your health care provider about your test results, treatment options, and if necessary, the need for more tests. Vaccines  Your health care provider may recommend certain vaccines, such as: Influenza vaccine. This is recommended every year. Tetanus, diphtheria, and acellular pertussis (Tdap, Td) vaccine. You may need a Td booster every 10 years. Zoster vaccine. You may need this after age 38. Pneumococcal 13-valent conjugate (PCV13) vaccine. One dose is recommended after age 31. Pneumococcal polysaccharide (PPSV23) vaccine. One dose is recommended after age 49. Talk to your health care provider about which screenings and vaccines you need and how often you need them. This information is not intended to replace advice given to you by your health care provider. Make sure you discuss any questions you have with your health care provider. Document Released: 04/27/2015 Document Revised: 12/19/2015  Document Reviewed: 01/30/2015 Elsevier Interactive Patient Education  2017 [place] Prevention in the Home Falls can cause injuries. They can happen to people of all ages. There are many things you can do to make your home safe and to help prevent falls. What can I do on the outside of my home? Regularly fix the edges of walkways and driveways and fix any cracks. Remove anything that might make you trip as you walk through a door, such as a raised step or threshold. Trim any bushes or trees on the path to your home. Use bright outdoor lighting. Clear any walking paths of anything that might make someone trip, such as rocks or tools. Regularly check to see if handrails are loose or broken. Make sure that both sides of any steps have handrails. Any raised decks and porches should have guardrails on  the edges. Have any leaves, snow, or ice cleared regularly. Use sand or salt on walking paths during winter. Clean up any spills in your garage right away. This includes oil or grease spills. What can I do in the bathroom? Use night lights. Install grab bars by the toilet and in the tub and shower. Do not use towel bars as grab bars. Use non-skid mats or decals in the tub or shower. If you need to sit down in the shower, use a plastic, non-slip stool. Keep the floor dry. Clean up any water that spills on the floor as soon as it happens. Remove soap buildup in the tub or shower regularly. Attach bath mats securely with double-sided non-slip rug tape. Do not have throw rugs and other things on the floor that can make you trip. What can I do in the bedroom? Use night lights. Make sure that you have a light by your bed that is easy to reach. Do not use any sheets or blankets that are too big for your bed. They should not hang down onto the floor. Have a firm chair that has side arms. You can use this for support while you get dressed. Do not have throw rugs and other things on the floor  that can make you trip. What can I do in the kitchen? Clean up any spills right away. Avoid walking on wet floors. Keep items that you use a lot in easy-to-reach places. If you need to reach something above you, use a strong step stool that has a grab bar. Keep electrical cords out of the way. Do not use floor polish or wax that makes floors slippery. If you must use wax, use non-skid floor wax. Do not have throw rugs and other things on the floor that can make you trip. What can I do with my stairs? Do not leave any items on the stairs. Make sure that there are handrails on both sides of the stairs and use them. Fix handrails that are broken or loose. Make sure that handrails are as long as the stairways. Check any carpeting to make sure that it is firmly attached to the stairs. Fix any carpet that is loose or worn. Avoid having throw rugs at the top or bottom of the stairs. If you do have throw rugs, attach them to the floor with carpet tape. Make sure that you have a light switch at the top of the stairs and the bottom of the stairs. If you do not have them, ask someone to add them for you. What else can I do to help prevent falls? Wear shoes that: Do not have high heels. Have rubber bottoms. Are comfortable and fit you well. Are closed at the toe. Do not wear sandals. If you use a stepladder: Make sure that it is fully opened. Do not climb a closed stepladder. Make sure that both sides of the stepladder are locked into place. Ask someone to hold it for you, if possible. Clearly mark and make sure that you can see: Any grab bars or handrails. First and last steps. Where the edge of each step is. Use tools that help you move around (mobility aids) if they are needed. These include: Canes. Walkers. Scooters. Crutches. Turn on the lights when you go into a dark area. Replace any light bulbs as soon as they burn out. Set up your furniture so you have a clear path. Avoid moving your  furniture around. If any of your floors are  uneven, fix them. If there are any pets around you, be aware of where they are. Review your medicines with your doctor. Some medicines can make you feel dizzy. This can increase your chance of falling. Ask your doctor what other things that you can do to help prevent falls. This information is not intended to replace advice given to you by your health care provider. Make sure you discuss any questions you have with your health care provider. Document Released: 01/25/2009 Document Revised: 09/06/2015 Document Reviewed: 05/05/2014 Elsevier Interactive Patient Education  2017 [employer].

## 2022-03-30 NOTE — Patient Instructions (Addendum)
      Blood work was ordered.   The lab is on the first floor.    Medications changes include :       A referral was ordered for XXX.     Someone will call you to schedule an appointment.    Return in about 6 months (around 10/02/2022) for Physical Exam.

## 2022-03-30 NOTE — Progress Notes (Signed)
      Subjective:    Patient ID: [name], female    DOB: 1956-02-19, 66 y.o.   MRN: [id number]     HPI [place] is here for follow up of her chronic medical problems, including htn, DM, hld, peripheral neuropathy, h/o gout, anxiety, B12 def    Medications and allergies reviewed with patient and updated if appropriate.  Current Outpatient Medications on File Prior to Visit  Medication Sig Dispense Refill  . Blood Glucose Monitoring Suppl (ACCU-CHEK GUIDE) w/Device KIT USE TO CHECK BLOOD SUGAR  ONCE DAILY AND AS NEEDED 1 kit 2  . colchicine 0.6 MG tablet TAKE 2 TABLETS BY MOUTH FOR ONE DOSE AND THEN 1 TABLET  BY MOUTH 1 HOUR LATER FOR   GOUT FLARE 21 tablet 3  . fenofibrate (TRICOR) 145 MG tablet TAKE 1 TABLET BY MOUTH ONCE DAILY 90 tablet 3  . gabapentin (NEURONTIN) 600 MG tablet TAKE 1 TABLET BY MOUTH 3  TIMES DAILY 270 tablet 3  . glucose blood (FREESTYLE LITE) test strip Use as instructed 100 each 12  . Lancets (ONETOUCH ULTRASOFT) lancets Use as directed to check sugars once daily and as needed.  Dx E11.43 100 each 12  . lisinopril (ZESTRIL) 10 MG tablet TAKE 1 TABLET BY MOUTH DAILY 100 tablet 2  . meloxicam (MOBIC) 15 MG tablet Take 1 tablet (15 mg total) by mouth daily as needed for pain. 30 tablet 0  . metFORMIN (GLUCOPHAGE) 500 MG tablet TAKE 1 TABLET BY MOUTH  TWICE DAILY WITH MEALS 180 tablet 3  . rosuvastatin (CRESTOR) 5 MG tablet TAKE 1 TABLET BY MOUTH TWICE  WEEKLY 29 tablet 2  . vitamin B-12 (CYANOCOBALAMIN) 500 MCG tablet Take 1,500 mcg by mouth daily.     No current facility-administered medications on file prior to visit.     Review of Systems     Objective:  There were no vitals filed for this visit. BP Readings from Last 3 Encounters:  04/16/22 122/72  03/12/22 110/60  11/06/21 (!) 155/74   Wt Readings from Last 3 Encounters:  04/16/22 164 lb (74.4 kg)  03/24/22 161 lb (73 kg)  03/12/22 161 lb (73 kg)   There is no height or weight on  file to calculate BMI.    Physical Exam     Lab Results  Component Value Date   WBC 7.7 03/27/2021   HGB 12.6 03/27/2021   HCT 39.3 03/27/2021   PLT 322.0 03/27/2021   GLUCOSE 93 04/16/2022   CHOL 151 04/16/2022   TRIG 160.0 (H) 04/16/2022   HDL 49.40 04/16/2022   LDLDIRECT 51.0 12/13/2018   LDLCALC 70 04/16/2022   ALT 12 04/16/2022   AST 19 04/16/2022   NA 137 04/16/2022   K 4.6 04/16/2022   CL 106 04/16/2022   CREATININE 1.10 04/16/2022   BUN 29 (H) 04/16/2022   CO2 21 04/16/2022   TSH 0.82 10/09/2015   INR 0.91 04/01/2013   HGBA1C 6.2 04/16/2022   MICROALBUR 11.0 (H) 12/01/2016     Assessment & Plan:    See Problem List for Assessment and Plan of chronic medical problems.    This encounter was created in error - please disregard.

## 2022-04-02 ENCOUNTER — Encounter: Payer: Medicare Other | Admitting: Internal Medicine

## 2022-04-02 DIAGNOSIS — F419 Anxiety disorder, unspecified: Secondary | ICD-10-CM

## 2022-04-02 DIAGNOSIS — I10 Essential (primary) hypertension: Secondary | ICD-10-CM

## 2022-04-02 DIAGNOSIS — E538 Deficiency of other specified B group vitamins: Secondary | ICD-10-CM

## 2022-04-02 DIAGNOSIS — E781 Pure hyperglyceridemia: Secondary | ICD-10-CM

## 2022-04-02 DIAGNOSIS — Z8739 Personal history of other diseases of the musculoskeletal system and connective tissue: Secondary | ICD-10-CM

## 2022-04-02 DIAGNOSIS — E1143 Type 2 diabetes mellitus with diabetic autonomic (poly)neuropathy: Secondary | ICD-10-CM

## 2022-04-02 DIAGNOSIS — G622 Polyneuropathy due to other toxic agents: Secondary | ICD-10-CM

## 2022-04-15 ENCOUNTER — Encounter: Payer: Self-pay | Admitting: Internal Medicine

## 2022-04-15 NOTE — Patient Instructions (Addendum)
     Flu immunization administered today.     Blood work was ordered.   The lab is on the first floor.    Medications changes include :   none      Return in about 6 months (around 10/15/2022) for follow up.

## 2022-04-15 NOTE — Progress Notes (Unsigned)
Subjective:    Patient ID: [name], female    DOB: July 28, 1955, 67 y.o.   MRN: [id number]     HPI [name] is here for follow up of her chronic medical problems, including htn, DM, hld, peripheral neuropathy, h/o gout, anxiety, B12 def  She does a little walking around the neighborhood.  It has been a stressful past year-2 of her sisters died and one of her brothers died.  She also lost a nephew.  Overall she feels like she is dealing with everything well.  She does take the alprazolam as needed and that has been helpful.  Medications and allergies reviewed with patient and updated if appropriate.  Current Outpatient Medications on File Prior to Visit  Medication Sig Dispense Refill   Blood Glucose Monitoring Suppl (ACCU-CHEK GUIDE) w/Device KIT USE TO CHECK BLOOD SUGAR  ONCE DAILY AND AS NEEDED 1 kit 2   colchicine 0.6 MG tablet TAKE 2 TABLETS BY MOUTH FOR ONE DOSE AND THEN 1 TABLET  BY MOUTH 1 HOUR LATER FOR   GOUT FLARE 21 tablet 3   fenofibrate (TRICOR) 145 MG tablet TAKE 1 TABLET BY MOUTH ONCE DAILY 90 tablet 3   gabapentin (NEURONTIN) 600 MG tablet TAKE 1 TABLET BY MOUTH 3  TIMES DAILY 270 tablet 3   glucose blood (FREESTYLE LITE) test strip Use as instructed 100 each 12   Lancets (ONETOUCH ULTRASOFT) lancets Use as directed to check sugars once daily and as needed.  Dx E11.43 100 each 12   lisinopril (ZESTRIL) 10 MG tablet TAKE 1 TABLET BY MOUTH DAILY 100 tablet 2   meloxicam (MOBIC) 15 MG tablet Take 1 tablet (15 mg total) by mouth daily as needed for pain. 30 tablet 0   metFORMIN (GLUCOPHAGE) 500 MG tablet TAKE 1 TABLET BY MOUTH  TWICE DAILY WITH MEALS 180 tablet 3   rosuvastatin (CRESTOR) 5 MG tablet TAKE 1 TABLET BY MOUTH TWICE  WEEKLY 29 tablet 2   vitamin B-12 (CYANOCOBALAMIN) 500 MCG tablet Take 1,500 mcg by mouth daily.     No current facility-administered medications on file prior to visit.     Review of Systems  Constitutional:  Negative for  fever.  Respiratory:  Positive for cough (from residucal URI). Negative for shortness of breath and wheezing.   Cardiovascular:  Negative for chest pain, palpitations and leg swelling.  Neurological:  Negative for light-headedness and headaches.       Objective:   Vitals:   04/16/22 1341  BP: 122/72  Pulse: 74  Temp: 98 F (36.7 C)  SpO2: 98%   BP Readings from Last 3 Encounters:  04/16/22 122/72  03/12/22 110/60  11/06/21 (!) 155/74   Wt Readings from Last 3 Encounters:  04/16/22 164 lb (74.4 kg)  03/24/22 161 lb (73 kg)  03/12/22 161 lb (73 kg)   Body mass index is 26.47 kg/m.    Physical Exam Constitutional:      General: She is not in acute distress.    Appearance: Normal appearance.  HENT:     Head: Normocephalic and atraumatic.  Eyes:     Conjunctiva/sclera: Conjunctivae normal.  Cardiovascular:     Rate and Rhythm: Normal rate and regular rhythm.     Heart sounds: Normal heart sounds. No murmur heard. Pulmonary:     Effort: Pulmonary effort is normal. No respiratory distress.     Breath sounds: Normal breath sounds. No wheezing.  Musculoskeletal:     Cervical back: Neck supple.  Right lower leg: No edema.     Left lower leg: No edema.  Lymphadenopathy:     Cervical: No cervical adenopathy.  Skin:    General: Skin is warm and dry.     Findings: No rash.  Neurological:     Mental Status: She is alert. Mental status is at baseline.  Psychiatric:        Mood and Affect: Mood normal.        Behavior: Behavior normal.        Lab Results  Component Value Date   WBC 7.7 03/27/2021   HGB 12.6 03/27/2021   HCT 39.3 03/27/2021   PLT 322.0 03/27/2021   GLUCOSE 104 (H) 10/01/2021   CHOL 135 10/01/2021   TRIG 115.0 10/01/2021   HDL 48.10 10/01/2021   LDLDIRECT 51.0 12/13/2018   LDLCALC 64 10/01/2021   ALT 13 10/01/2021   AST 19 10/01/2021   NA 135 10/01/2021   K 4.8 10/01/2021   CL 104 10/01/2021   CREATININE 1.20 10/01/2021   BUN 22  10/01/2021   CO2 23 10/01/2021   TSH 0.82 10/09/2015   INR 0.91 04/01/2013   HGBA1C 6.1 10/01/2021   MICROALBUR 11.0 (H) 12/01/2016     Assessment & Plan:    See Problem List for Assessment and Plan of chronic medical problems.

## 2022-04-16 ENCOUNTER — Encounter: Payer: Self-pay | Admitting: Internal Medicine

## 2022-04-16 ENCOUNTER — Ambulatory Visit (INDEPENDENT_AMBULATORY_CARE_PROVIDER_SITE_OTHER): Payer: Medicare Other | Admitting: Internal Medicine

## 2022-04-16 VITALS — BP 122/72 | HR 74 | Temp 98.0°F | Ht 66.0 in | Wt 164.0 lb

## 2022-04-16 DIAGNOSIS — Z23 Encounter for immunization: Secondary | ICD-10-CM

## 2022-04-16 DIAGNOSIS — E1143 Type 2 diabetes mellitus with diabetic autonomic (poly)neuropathy: Secondary | ICD-10-CM

## 2022-04-16 DIAGNOSIS — I700 Atherosclerosis of aorta: Secondary | ICD-10-CM

## 2022-04-16 DIAGNOSIS — F17210 Nicotine dependence, cigarettes, uncomplicated: Secondary | ICD-10-CM | POA: Diagnosis not present

## 2022-04-16 DIAGNOSIS — Z8739 Personal history of other diseases of the musculoskeletal system and connective tissue: Secondary | ICD-10-CM

## 2022-04-16 DIAGNOSIS — F419 Anxiety disorder, unspecified: Secondary | ICD-10-CM | POA: Diagnosis not present

## 2022-04-16 DIAGNOSIS — E781 Pure hyperglyceridemia: Secondary | ICD-10-CM | POA: Diagnosis not present

## 2022-04-16 DIAGNOSIS — I10 Essential (primary) hypertension: Secondary | ICD-10-CM

## 2022-04-16 DIAGNOSIS — E538 Deficiency of other specified B group vitamins: Secondary | ICD-10-CM

## 2022-04-16 LAB — COMPREHENSIVE METABOLIC PANEL
ALT: 12 U/L (ref 0–35)
AST: 19 U/L (ref 0–37)
Albumin: 4.5 g/dL (ref 3.5–5.2)
Alkaline Phosphatase: 53 U/L (ref 39–117)
BUN: 29 mg/dL — ABNORMAL HIGH (ref 6–23)
CO2: 21 mEq/L (ref 19–32)
Calcium: 9.8 mg/dL (ref 8.4–10.5)
Chloride: 106 mEq/L (ref 96–112)
Creatinine, Ser: 1.1 mg/dL (ref 0.40–1.20)
GFR: 52.6 mL/min — ABNORMAL LOW (ref >60.00)
Glucose, Bld: 93 mg/dL (ref 70–99)
Potassium: 4.6 mEq/L (ref 3.5–5.1)
Sodium: 137 mEq/L (ref 135–145)
Total Bilirubin: 0.5 mg/dL (ref 0.2–1.2)
Total Protein: 7.4 g/dL (ref 6.0–8.3)

## 2022-04-16 LAB — HEMOGLOBIN A1C: Hgb A1c MFr Bld: 6.2 % (ref 4.6–6.5)

## 2022-04-16 LAB — LIPID PANEL
Cholesterol: 151 mg/dL (ref 0–200)
HDL: 49.4 mg/dL (ref >39.00)
LDL Cholesterol: 70 mg/dL (ref 0–99)
NonHDL: 101.71
Total CHOL/HDL Ratio: 3
Triglycerides: 160 mg/dL — ABNORMAL HIGH (ref 0.0–149.0)
VLDL: 32 mg/dL (ref 0.0–40.0)

## 2022-04-16 MED ORDER — ALPRAZOLAM 0.5 MG PO TABS: 0.5000 mg | ORAL_TABLET | Freq: Two times a day (BID) | ORAL | 0 refills | Status: DC | PRN

## 2022-04-16 MED ORDER — HYDROCHLOROTHIAZIDE 25 MG PO TABS: 25.0000 mg | ORAL_TABLET | Freq: Every day | ORAL | 3 refills | Status: DC

## 2022-04-16 NOTE — Assessment & Plan Note (Addendum)
Smoking cessation was discussed for 4 1/2 minutes.  The patient was counseled on the dangers of tobacco use, and was advised to quit and wants to quit .  She has cut back a little, but is still having difficulty quitting.  She has tried patches in the gum in the past.  She also tried vaping.  Reviewed ways of quitting smoking including nicotine replacement, vapping/e-cigarettes, cold [place], weaning off cigarettes, and pharmacotherapy (wellbutrin and chantix).  She was not familiar with Wellbutrin and Chantix and I reviewed both of those medications and possible side effects.  We discussed that because it so difficult to quit you can try several times and many of these tools to help quit are not very successful.  She is somewhat dedicated to quit smoking and encouraged her to continue to work on it.  Discussed to think about Chantix and Wellbutrin and if she wants to try them to let me know.

## 2022-04-16 NOTE — Assessment & Plan Note (Signed)
Chronic   Lab Results  Component Value Date   HGBA1C 6.1 10/01/2021   Sugars well controlled Check A1c, urine microalbumin today Continue metformin 500 mg twice daily Stressed regular exercise, diabetic diet

## 2022-04-16 NOTE — Assessment & Plan Note (Addendum)
Chronic Regular exercise and healthy diet encouraged Check lipid panel  Continue fenofibrate 145 mg daily, Crestor 5 mg twice weekly Stressed smoking cessation

## 2022-04-16 NOTE — Assessment & Plan Note (Signed)
Chronic Blood pressure well controlled CMP Continue lisinopril 10 mg daily, hydrochlorothiazide 25 mg daily

## 2022-04-16 NOTE — Assessment & Plan Note (Signed)
Chronic Advised B12 supplementation which she is taking-continue long-term

## 2022-04-16 NOTE — Assessment & Plan Note (Signed)
Chronic Deferred daily preventative medication Taking colchicine as needed-continue

## 2022-04-16 NOTE — Assessment & Plan Note (Addendum)
Chronic Continue rosuvastatin 5 mg twice weekly, fenofibrate 145 mg daily Stressed healthy diet, regular exercise, smoking cessation and not overdoing alcohol intake Check lipid panel

## 2022-04-16 NOTE — Assessment & Plan Note (Signed)
Chronic Controlled, Stable Continue alprazolam 0.5 mg twice daily as needed-not taking on a regular basis

## 2022-04-17 NOTE — Addendum Note (Signed)
Addended by: Marcina Millard on: 04/17/2022 07:49 AM   Modules accepted: Orders

## 2022-05-20 ENCOUNTER — Ambulatory Visit
Admission: RE | Admit: 2022-05-20 | Discharge: 2022-05-20 | Disposition: A | Discharge status: Home / Self Care | Payer: 59 | Source: Ambulatory Visit | Attending: Internal Medicine | Admitting: Internal Medicine

## 2022-05-20 DIAGNOSIS — Z1239 Encounter for other screening for malignant neoplasm of breast: Secondary | ICD-10-CM

## 2022-06-18 ENCOUNTER — Telehealth: Payer: Self-pay | Admitting: Internal Medicine

## 2022-06-18 ENCOUNTER — Other Ambulatory Visit: Payer: Self-pay | Admitting: Internal Medicine

## 2022-06-18 DIAGNOSIS — Z933 Colostomy status: Secondary | ICD-10-CM

## 2022-06-18 NOTE — Telephone Encounter (Signed)
Pt is requesting a call from Dr. Quay Burow nurse to discuss some medical supplies she needs re-ordered.   Please call pt at:  [phone]

## 2022-06-18 NOTE — Telephone Encounter (Signed)
Called pt she states need MD to call  [facility] on [address] and placed order for her colostomy bag/supples. Skin Protector Barrier 1 Box Colostomy Bag [phone] #60 (holester) Wafer # [id number] 4 Boxes Odor Eliminator # [id number] 1 bottle

## 2022-06-22 MED ORDER — ODOR ELIMINATOR LIQD: 0 refills | Status: AC

## 2022-06-22 NOTE — Telephone Encounter (Signed)
printed

## 2022-06-24 NOTE — Telephone Encounter (Signed)
Spoke with Gwinda Passe today with [facility] and obtained fax number.  Orders faxed today to [phone]

## 2022-06-26 ENCOUNTER — Other Ambulatory Visit: Payer: Self-pay

## 2022-06-26 ENCOUNTER — Telehealth: Payer: Self-pay | Admitting: Internal Medicine

## 2022-06-26 ENCOUNTER — Other Ambulatory Visit: Payer: Self-pay | Admitting: Internal Medicine

## 2022-06-26 MED ORDER — FENOFIBRATE 145 MG PO TABS: 145.0000 mg | ORAL_TABLET | Freq: Every day | ORAL | 3 refills | Status: DC

## 2022-06-26 NOTE — Telephone Encounter (Signed)
Scripts sent in for patient today.

## 2022-06-26 NOTE — Telephone Encounter (Signed)
Prescription Request  06/26/2022  LOV: 04/16/2022  What is the name of the medication or equipment? Metformin glucose blood (FREESTYLE LITE) test strip  fenofibrate (TRICOR) 145 MG tablet    Have you contacted your pharmacy to request a refill? Yes   Which pharmacy would you like this sent to?  [facility]) [place], [place] [facility] [address] [place] [address] [place] [address] Phone: [phone] Fax: [phone]  Patient notified that their request is being sent to the clinical staff for review and that they should receive a response within 2 business days.   Please advise at Mobile [phone] (mobile)

## 2022-07-02 ENCOUNTER — Other Ambulatory Visit: Payer: Self-pay | Admitting: Internal Medicine

## 2022-07-02 DIAGNOSIS — F419 Anxiety disorder, unspecified: Secondary | ICD-10-CM

## 2022-07-07 ENCOUNTER — Telehealth: Payer: Self-pay | Admitting: Internal Medicine

## 2022-07-07 NOTE — Telephone Encounter (Signed)
Prescription Request  07/07/2022  LOV: 04/16/2022  What is the name of the medication or equipment? ALPRAZolam (XANAX) 0.5 MG tablet  fenofibrate (TRICOR) 145 MG tablet  Have you contacted your pharmacy to request a refill? Yes   Which pharmacy would you like this sent to?  [facility] ([place]) [place], [place] [facility] [address] [place] [address] [place] [address] Phone: [phone] Fax: [phone]   Patient notified that their request is being sent to the clinical staff for review and that they should receive a response within 2 business days.   Please advise at Mobile [phone] (mobile)

## 2022-07-07 NOTE — Telephone Encounter (Signed)
Please notify pt when refill is sent over to pharmacy.

## 2022-07-08 NOTE — Telephone Encounter (Signed)
Called Pt was unable to leave voicemail.

## 2022-07-24 ENCOUNTER — Other Ambulatory Visit: Payer: Self-pay

## 2022-07-24 MED ORDER — ACCU-CHEK GUIDE W/DEVICE KIT: PACK | 2 refills | Status: AC

## 2022-07-31 ENCOUNTER — Other Ambulatory Visit: Payer: Self-pay | Admitting: Internal Medicine

## 2022-08-07 ENCOUNTER — Telehealth: Payer: Self-pay | Admitting: Internal Medicine

## 2022-08-07 NOTE — Telephone Encounter (Signed)
Patient called and said [facility] will be sending over a fax for patient's ostomy supplies. She decided to not use [employer] [facility]. Best callback is [phone].

## 2022-08-08 NOTE — Telephone Encounter (Signed)
Noted.  Will wait for fax

## 2022-08-11 NOTE — Telephone Encounter (Signed)
Chris with [facility] called back wanting Rx sent over for Ostomy supplies best call back # for chris is [phone]

## 2022-08-12 NOTE — Telephone Encounter (Signed)
Thayer Ohm from [employer] said the fax we sent to them was future dated for July. They will need one for an immediate date. Best callback is [phone].

## 2022-08-13 NOTE — Telephone Encounter (Signed)
Faxed today

## 2022-08-15 DIAGNOSIS — Z433 Encounter for attention to colostomy: Secondary | ICD-10-CM | POA: Diagnosis not present

## 2022-09-20 DIAGNOSIS — Z433 Encounter for attention to colostomy: Secondary | ICD-10-CM | POA: Diagnosis not present

## 2022-09-21 ENCOUNTER — Other Ambulatory Visit: Payer: Self-pay | Admitting: Internal Medicine

## 2022-10-06 ENCOUNTER — Other Ambulatory Visit: Payer: Self-pay | Admitting: Internal Medicine

## 2022-10-06 ENCOUNTER — Telehealth: Payer: Self-pay | Admitting: Internal Medicine

## 2022-10-06 DIAGNOSIS — F419 Anxiety disorder, unspecified: Secondary | ICD-10-CM

## 2022-10-06 MED ORDER — ALPRAZOLAM 0.5 MG PO TABS: 0.5000 mg | ORAL_TABLET | Freq: Two times a day (BID) | ORAL | 0 refills | Status: DC | PRN

## 2022-10-06 NOTE — Telephone Encounter (Signed)
Prescription Request  10/06/2022  LOV: 04/16/2022  What is the name of the medication or equipment?  ALPRAZolam (XANAX) 0.5 MG tablet   Have you contacted your pharmacy to request a refill? Yes   Which pharmacy would you like this sent to?  [facility] - [place], [place] - [address] [place] [address] Phone: [phone] Fax: [phone]    Patient notified that their request is being sent to the clinical staff for review and that they should receive a response within 2 business days.   Please advise at Mobile [phone] (mobile)

## 2022-10-14 ENCOUNTER — Encounter: Payer: Self-pay | Admitting: Internal Medicine

## 2022-10-14 NOTE — Progress Notes (Unsigned)
Subjective:    Patient ID: [name], female    DOB: October 09, 1955, 67 y.o.   MRN: [id number]     HPI [name] is here for follow up of her chronic medical problems.  Has had a few falls since memorial day.  She stumbled once and was carrying something too heavy one time.   Increased stress - smoking more  - wants to quit  Medications and allergies reviewed with patient and updated if appropriate.  Current Outpatient Medications on File Prior to Visit  Medication Sig Dispense Refill   ACCU-CHEK GUIDE test strip USE AS DIRECTED 300 strip 3   ALPRAZolam (XANAX) 0.5 MG tablet Take 1 tablet (0.5 mg total) by mouth 2 (two) times daily as needed. for anxiety 180 tablet 0   Blood Glucose Monitoring Suppl (ACCU-CHEK GUIDE) w/Device KIT USE TO CHECK BLOOD SUGAR  ONCE DAILY AND AS NEEDED 1 kit 2   colchicine 0.6 MG tablet TAKE 2 TABLETS BY MOUTH FOR ONE DOSE AND THEN 1 TABLET  BY MOUTH 1 HOUR LATER FOR   GOUT FLARE 21 tablet 3   fenofibrate (TRICOR) 145 MG tablet Take 1 tablet (145 mg total) by mouth daily. 90 tablet 3   gabapentin (NEURONTIN) 600 MG tablet TAKE 1 TABLET BY MOUTH 3 TIMES  DAILY 300 tablet 2   hydrochlorothiazide (HYDRODIURIL) 25 MG tablet Take 1 tablet (25 mg total) by mouth daily. 90 tablet 3   Lancets (ONETOUCH ULTRASOFT) lancets Use as directed to check sugars once daily and as needed.  Dx E11.43 100 each 12   lisinopril (ZESTRIL) 10 MG tablet TAKE 1 TABLET BY MOUTH DAILY 100 tablet 2   metFORMIN (GLUCOPHAGE) 500 MG tablet TAKE 1 TABLET BY MOUTH TWICE  DAILY WITH MEALS 200 tablet 2   Ostomy Supplies (ODOR ELIMINATOR) LIQD UAD 240 mL 0   rosuvastatin (CRESTOR) 5 MG tablet TAKE 1 TABLET BY MOUTH TWICE  WEEKLY 29 tablet 2   vitamin B-12 (CYANOCOBALAMIN) 500 MCG tablet Take 1,500 mcg by mouth daily.     No current facility-administered medications on file prior to visit.     Review of Systems  Constitutional:  Negative for fever.  Respiratory:  Positive for  cough (mild). Negative for shortness of breath and wheezing.   Cardiovascular:  Negative for chest pain, palpitations and leg swelling.  Neurological:  Negative for light-headedness and headaches.       Objective:   Vitals:   10/15/22 0754  BP: 104/70  Pulse: (!) 59  Temp: 98.2 F (36.8 C)  SpO2: 98%   BP Readings from Last 3 Encounters:  10/15/22 104/70  04/16/22 122/72  03/12/22 110/60   Wt Readings from Last 3 Encounters:  10/15/22 165 lb (74.8 kg)  04/16/22 164 lb (74.4 kg)  03/24/22 161 lb (73 kg)   Body mass index is 26.63 kg/m.    Physical Exam Constitutional:      General: She is not in acute distress.    Appearance: Normal appearance.  HENT:     Head: Normocephalic and atraumatic.  Eyes:     Conjunctiva/sclera: Conjunctivae normal.  Cardiovascular:     Rate and Rhythm: Normal rate and regular rhythm.     Heart sounds: Normal heart sounds.  Pulmonary:     Effort: Pulmonary effort is normal. No respiratory distress.     Breath sounds: Normal breath sounds. No wheezing.  Musculoskeletal:     Cervical back: Neck supple.     Right lower  leg: No edema.     Left lower leg: No edema.  Lymphadenopathy:     Cervical: No cervical adenopathy.  Skin:    General: Skin is warm and dry.     Findings: No rash.  Neurological:     Mental Status: She is alert. Mental status is at baseline.  Psychiatric:        Mood and Affect: Mood normal.        Behavior: Behavior normal.       Diabetic Foot Exam - Simple   Simple Foot Form Diabetic Foot exam was performed with the following findings: Yes 10/15/2022  8:22 AM  Visual Inspection See comments: Yes Sensation Testing See comments: Yes Pulse Check Posterior Tibialis and Dorsalis pulse intact bilaterally: Yes Comments Decreased sensation with light touch b/l feet.  Callus formation b/l plantar surfaces near second TCP      Lab Results  Component Value Date   WBC 7.7 03/27/2021   HGB 12.6 03/27/2021   HCT  39.3 03/27/2021   PLT 322.0 03/27/2021   GLUCOSE 93 04/16/2022   CHOL 151 04/16/2022   TRIG 160.0 (H) 04/16/2022   HDL 49.40 04/16/2022   LDLDIRECT 51.0 12/13/2018   LDLCALC 70 04/16/2022   ALT 12 04/16/2022   AST 19 04/16/2022   NA 137 04/16/2022   K 4.6 04/16/2022   CL 106 04/16/2022   CREATININE 1.10 04/16/2022   BUN 29 (H) 04/16/2022   CO2 21 04/16/2022   TSH 0.82 10/09/2015   INR 0.91 04/01/2013   HGBA1C 6.2 04/16/2022   MICROALBUR 11.0 (H) 12/01/2016     Assessment & Plan:    See Problem List for Assessment and Plan of chronic medical problems.

## 2022-10-14 NOTE — Patient Instructions (Addendum)
      Blood work was ordered.   The lab is on the first floor.    Medications changes include :   decrease gabapentin - stop mid day dose.  Try chantix.        Return in about 6 months (around 04/17/2023) for Physical Exam.

## 2022-10-15 ENCOUNTER — Ambulatory Visit (INDEPENDENT_AMBULATORY_CARE_PROVIDER_SITE_OTHER): Payer: 59 | Admitting: Internal Medicine

## 2022-10-15 VITALS — BP 104/70 | HR 59 | Temp 98.2°F | Ht 66.0 in | Wt 165.0 lb

## 2022-10-15 DIAGNOSIS — E538 Deficiency of other specified B group vitamins: Secondary | ICD-10-CM | POA: Diagnosis not present

## 2022-10-15 DIAGNOSIS — F17210 Nicotine dependence, cigarettes, uncomplicated: Secondary | ICD-10-CM | POA: Diagnosis not present

## 2022-10-15 DIAGNOSIS — F419 Anxiety disorder, unspecified: Secondary | ICD-10-CM | POA: Diagnosis not present

## 2022-10-15 DIAGNOSIS — E785 Hyperlipidemia, unspecified: Secondary | ICD-10-CM

## 2022-10-15 DIAGNOSIS — Z8739 Personal history of other diseases of the musculoskeletal system and connective tissue: Secondary | ICD-10-CM

## 2022-10-15 DIAGNOSIS — G622 Polyneuropathy due to other toxic agents: Secondary | ICD-10-CM

## 2022-10-15 DIAGNOSIS — Z933 Colostomy status: Secondary | ICD-10-CM

## 2022-10-15 DIAGNOSIS — E1143 Type 2 diabetes mellitus with diabetic autonomic (poly)neuropathy: Secondary | ICD-10-CM | POA: Diagnosis not present

## 2022-10-15 DIAGNOSIS — I700 Atherosclerosis of aorta: Secondary | ICD-10-CM | POA: Diagnosis not present

## 2022-10-15 DIAGNOSIS — I10 Essential (primary) hypertension: Secondary | ICD-10-CM

## 2022-10-15 LAB — LIPID PANEL
Cholesterol: 152 mg/dL (ref 0–200)
HDL: 38.2 mg/dL — ABNORMAL LOW (ref >39.00)
LDL Cholesterol: 98 mg/dL (ref 0–99)
NonHDL: 113.95
Total CHOL/HDL Ratio: 4
Triglycerides: 82 mg/dL (ref 0.0–149.0)
VLDL: 16.4 mg/dL (ref 0.0–40.0)

## 2022-10-15 LAB — CBC WITH DIFFERENTIAL/PLATELET
Basophils Absolute: 0.1 10*3/uL (ref 0.0–0.1)
Basophils Relative: 0.8 % (ref 0.0–3.0)
Eosinophils Absolute: 0.2 10*3/uL (ref 0.0–0.7)
Eosinophils Relative: 1.8 % (ref 0.0–5.0)
HCT: 38.9 % (ref 36.0–46.0)
Hemoglobin: 12.4 g/dL (ref 12.0–15.0)
Lymphocytes Relative: 22.8 % (ref 12.0–46.0)
Lymphs Abs: 2 10*3/uL (ref 0.7–4.0)
MCHC: 31.8 g/dL (ref 30.0–36.0)
MCV: 91.7 fl (ref 78.0–100.0)
Monocytes Absolute: 0.7 10*3/uL (ref 0.1–1.0)
Monocytes Relative: 8.3 % (ref 3.0–12.0)
Neutro Abs: 5.8 10*3/uL (ref 1.4–7.7)
Neutrophils Relative %: 66.3 % (ref 43.0–77.0)
Platelets: 391 10*3/uL (ref 150.0–400.0)
RBC: 4.24 Mil/uL (ref 3.87–5.11)
RDW: 15.5 % (ref 11.5–15.5)
WBC: 8.8 10*3/uL (ref 4.0–10.5)

## 2022-10-15 LAB — COMPREHENSIVE METABOLIC PANEL
ALT: 13 U/L (ref 0–35)
AST: 20 U/L (ref 0–37)
Albumin: 4 g/dL (ref 3.5–5.2)
Alkaline Phosphatase: 72 U/L (ref 39–117)
BUN: 33 mg/dL — ABNORMAL HIGH (ref 6–23)
CO2: 22 mEq/L (ref 19–32)
Calcium: 9.4 mg/dL (ref 8.4–10.5)
Chloride: 108 mEq/L (ref 96–112)
Creatinine, Ser: 1.27 mg/dL — ABNORMAL HIGH (ref 0.40–1.20)
GFR: 44.12 mL/min — ABNORMAL LOW (ref >60.00)
Glucose, Bld: 123 mg/dL — ABNORMAL HIGH (ref 70–99)
Potassium: 4.8 mEq/L (ref 3.5–5.1)
Sodium: 135 mEq/L (ref 135–145)
Total Bilirubin: 0.3 mg/dL (ref 0.2–1.2)
Total Protein: 6.8 g/dL (ref 6.0–8.3)

## 2022-10-15 LAB — MICROALBUMIN / CREATININE URINE RATIO
Creatinine,U: 74 mg/dL
Microalb Creat Ratio: 0.9 mg/g (ref 0.0–30.0)
Microalb, Ur: <0.7 mg/dL (ref 0.0–1.9)

## 2022-10-15 LAB — HEMOGLOBIN A1C: Hgb A1c MFr Bld: 6.3 % (ref 4.6–6.5)

## 2022-10-15 MED ORDER — VARENICLINE TARTRATE (STARTER) 0.5 MG X 11 & 1 MG X 42 PO TBPK
ORAL_TABLET | ORAL | 0 refills | Status: DC
Start: 2022-10-15 — End: 2022-11-28

## 2022-10-15 MED ORDER — HYDROCHLOROTHIAZIDE 12.5 MG PO TABS: 12.5000 mg | ORAL_TABLET | Freq: Every day | ORAL | 3 refills | Status: DC

## 2022-10-15 MED ORDER — GABAPENTIN 600 MG PO TABS: 600.0000 mg | ORAL_TABLET | Freq: Two times a day (BID) | ORAL | 2 refills | Status: DC

## 2022-10-15 NOTE — Assessment & Plan Note (Signed)
Chronic Advised B12 supplementation which she is taking-continue long-term 

## 2022-10-15 NOTE — Assessment & Plan Note (Signed)
Chronic Regular exercise and healthy diet encouraged Check lipid panel  Continue fenofibrate 145 mg daily, Crestor 5 mg twice weekly Stressed smoking cessation 

## 2022-10-15 NOTE — Assessment & Plan Note (Addendum)
Chronic Blood pressure well controlled CMP, cbc Continue lisinopril 10 mg daily, hydrochlorothiazide 25 mg daily

## 2022-10-15 NOTE — Assessment & Plan Note (Addendum)
Smoking cessation was discussed for more than 4 1/2 minutes.  The patient was counseled on the dangers of tobacco use, and was advised to quit and wants to quit .  Reviewed ways of quitting smoking including nicotine replacement, vapping/e-cigarettes, cold [place], weaning off cigarettes, and pharmacotherapy (wellbutrin and chantix).  Has not tolerated the patches in the past.  Discussed side effects of wellbutrin and chantix.  Willing to try chantix.

## 2022-10-15 NOTE — Assessment & Plan Note (Signed)
Chronic Secondary to surgery for rectal cancer Continue home care Will order all supplies as needed

## 2022-10-15 NOTE — Assessment & Plan Note (Addendum)
Chronic   Lab Results  Component Value Date   HGBA1C 6.2 04/16/2022   Sugars well controlled Check A1c, urine microalbumin  Continue metformin 500 mg twice daily Stressed regular exercise, diabetic diet

## 2022-10-15 NOTE — Assessment & Plan Note (Signed)
Chronic Controlled Continue gabapentin 600 mg 3 times daily 

## 2022-10-15 NOTE — Assessment & Plan Note (Signed)
Chronic Continue rosuvastatin 5 mg twice weekly, fenofibrate 145 mg daily Stressed healthy diet, regular exercise, smoking cessation and not overdoing alcohol intake Check lipid panel 

## 2022-10-15 NOTE — Assessment & Plan Note (Signed)
Chronic Controlled, Stable Continue alprazolam 0.5 mg twice daily as needed 

## 2022-10-15 NOTE — Assessment & Plan Note (Signed)
Chronic Infrequent flares Deferred daily preventative medication Continue colchicine as needed

## 2022-11-03 ENCOUNTER — Other Ambulatory Visit: Payer: Self-pay | Admitting: Internal Medicine

## 2022-11-28 ENCOUNTER — Telehealth: Payer: Self-pay | Admitting: Internal Medicine

## 2022-11-28 MED ORDER — VARENICLINE TARTRATE 1 MG PO TABS: 1.0000 mg | ORAL_TABLET | Freq: Two times a day (BID) | ORAL | 0 refills | Status: DC

## 2022-11-28 NOTE — Telephone Encounter (Signed)
Prescription Request  11/28/2022  LOV: 10/15/2022  What is the name of the medication or equipment? Varenicline Tartrate, Starter, (CHANTIX STARTING MONTH PAK) 0.5 MG X 11 & 1 MG X 42 TBPK   Have you contacted your pharmacy to request a refill? No   Which pharmacy would you like this sent to?  [facility] - [place], [place] - [address] [place] [address] Phone: [phone] Fax: [phone]    Patient notified that their request is being sent to the clinical staff for review and that they should receive a response within 2 business days.   Please advise at Mobile [phone] (mobile)

## 2022-11-28 NOTE — Telephone Encounter (Signed)
Pls advise if ok.Marland KitchenJohny Griffin

## 2023-01-20 ENCOUNTER — Other Ambulatory Visit: Payer: Self-pay | Admitting: Internal Medicine

## 2023-01-20 DIAGNOSIS — F419 Anxiety disorder, unspecified: Secondary | ICD-10-CM

## 2023-01-28 IMAGING — DX DG LUMBAR SPINE 2-3V
3 series · 3 of 3 positions shown · non-contrast
Comparison: None.

CLINICAL DATA: Low back pain without known injury.

EXAM:
LUMBAR SPINE - 2-3 VIEW

[l-spine ap]
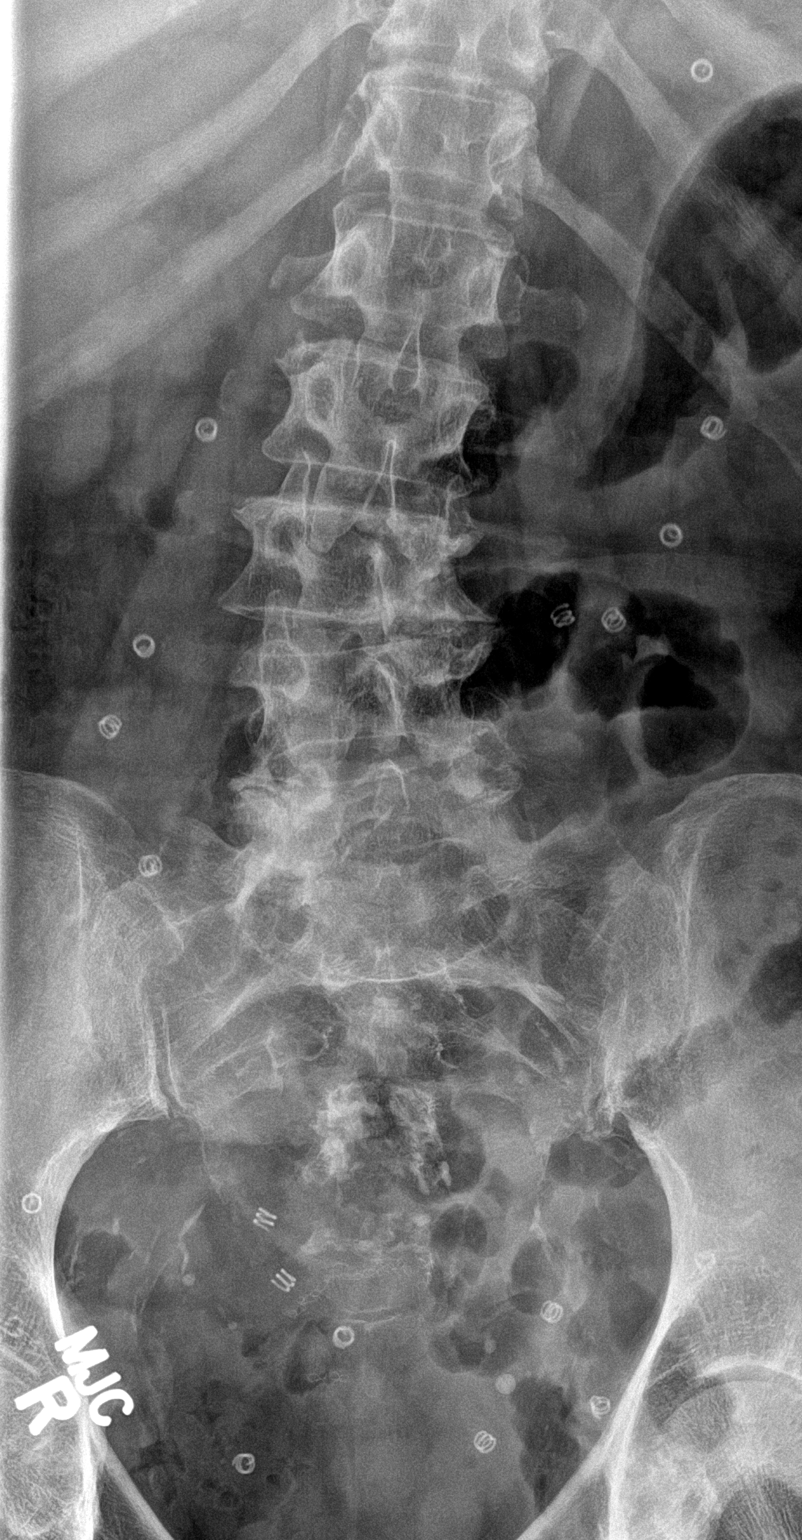

[l-spine lateral]
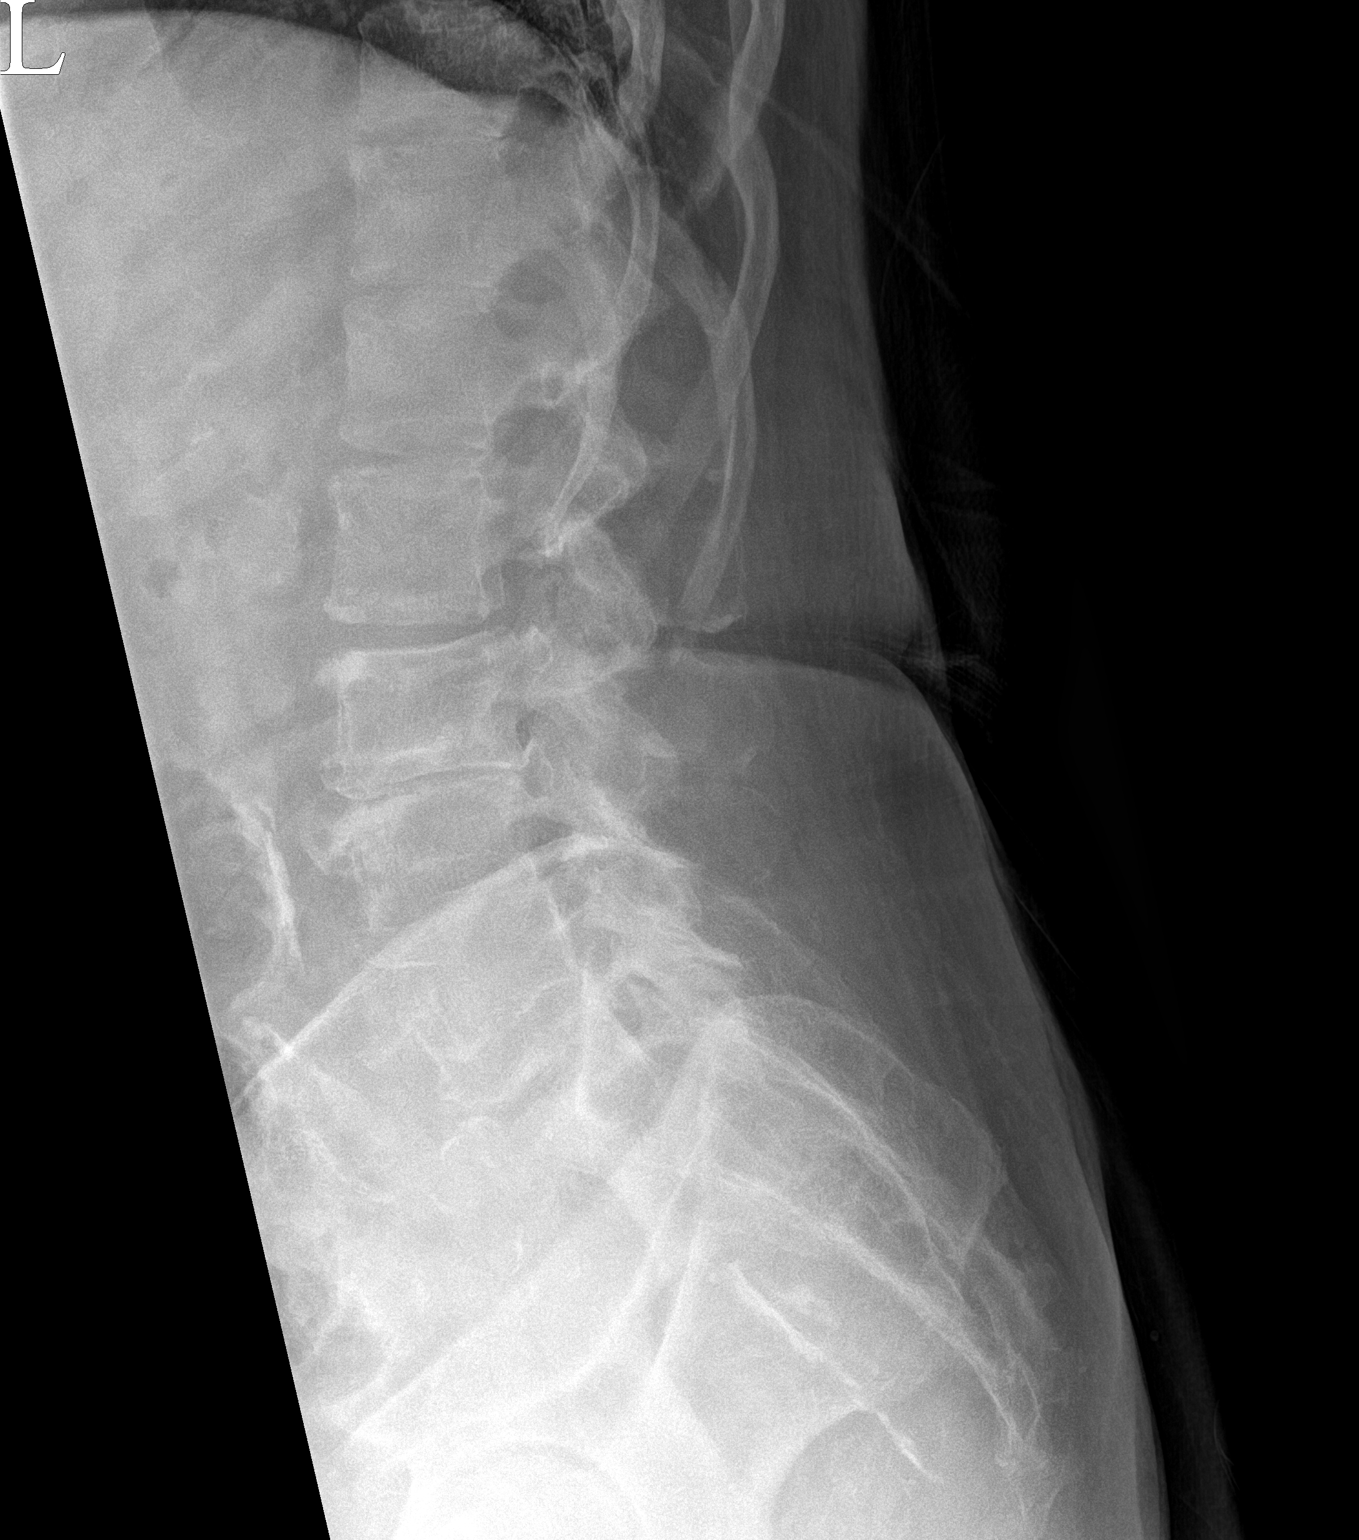

[l-spine spot]
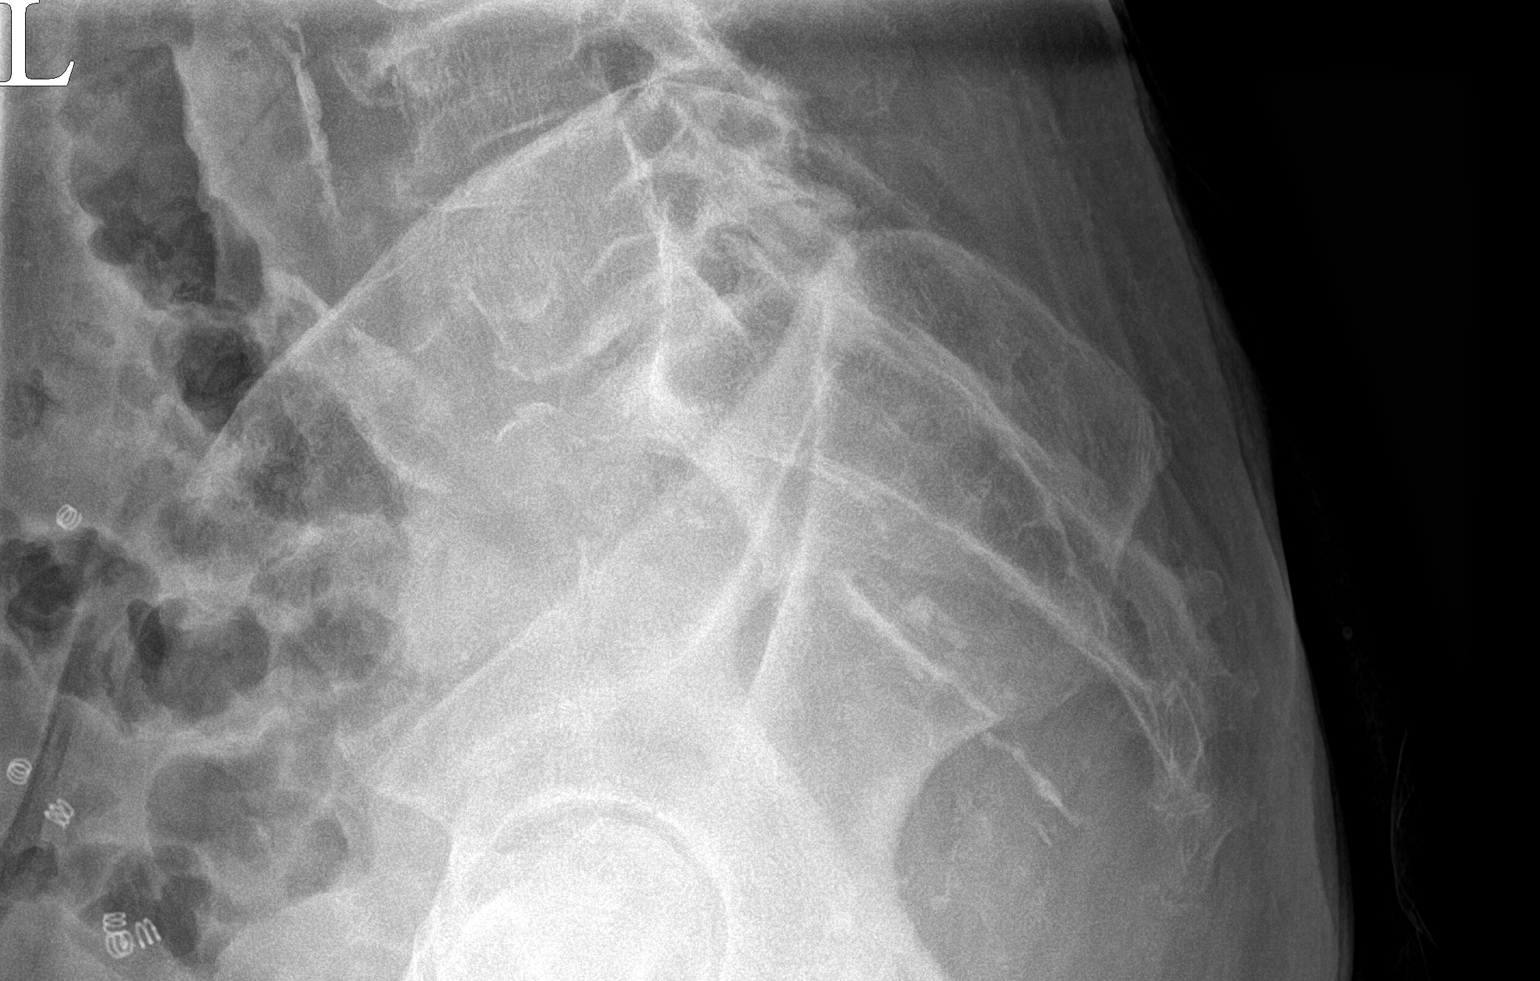

[3 of 3 positions shown; findings below may reference images not displayed]

FINDINGS: Curvature of the lumbar spine, apex to the right. No other
malalignment. No fractures identified. Lower lumbar facet
degenerative changes. Multilevel degenerative disc disease.
Calcified atherosclerosis in the abdominal aorta. Hernia mesh
identified.
IMPRESSION: 1. Curvature of the lumbar spine, apex to the right. No other
malalignment. No fracture.
2. Degenerative changes as above.
3. Calcified atherosclerosis in the abdominal aorta.

## 2023-01-28 IMAGING — DX DG PELVIS 1-2V
1 series · 1 of 1 positions shown · non-contrast
Comparison: None.

CLINICAL DATA: Pain.  No known injury.

EXAM:
PELVIS - 1-2 VIEW

[pelvis ap]
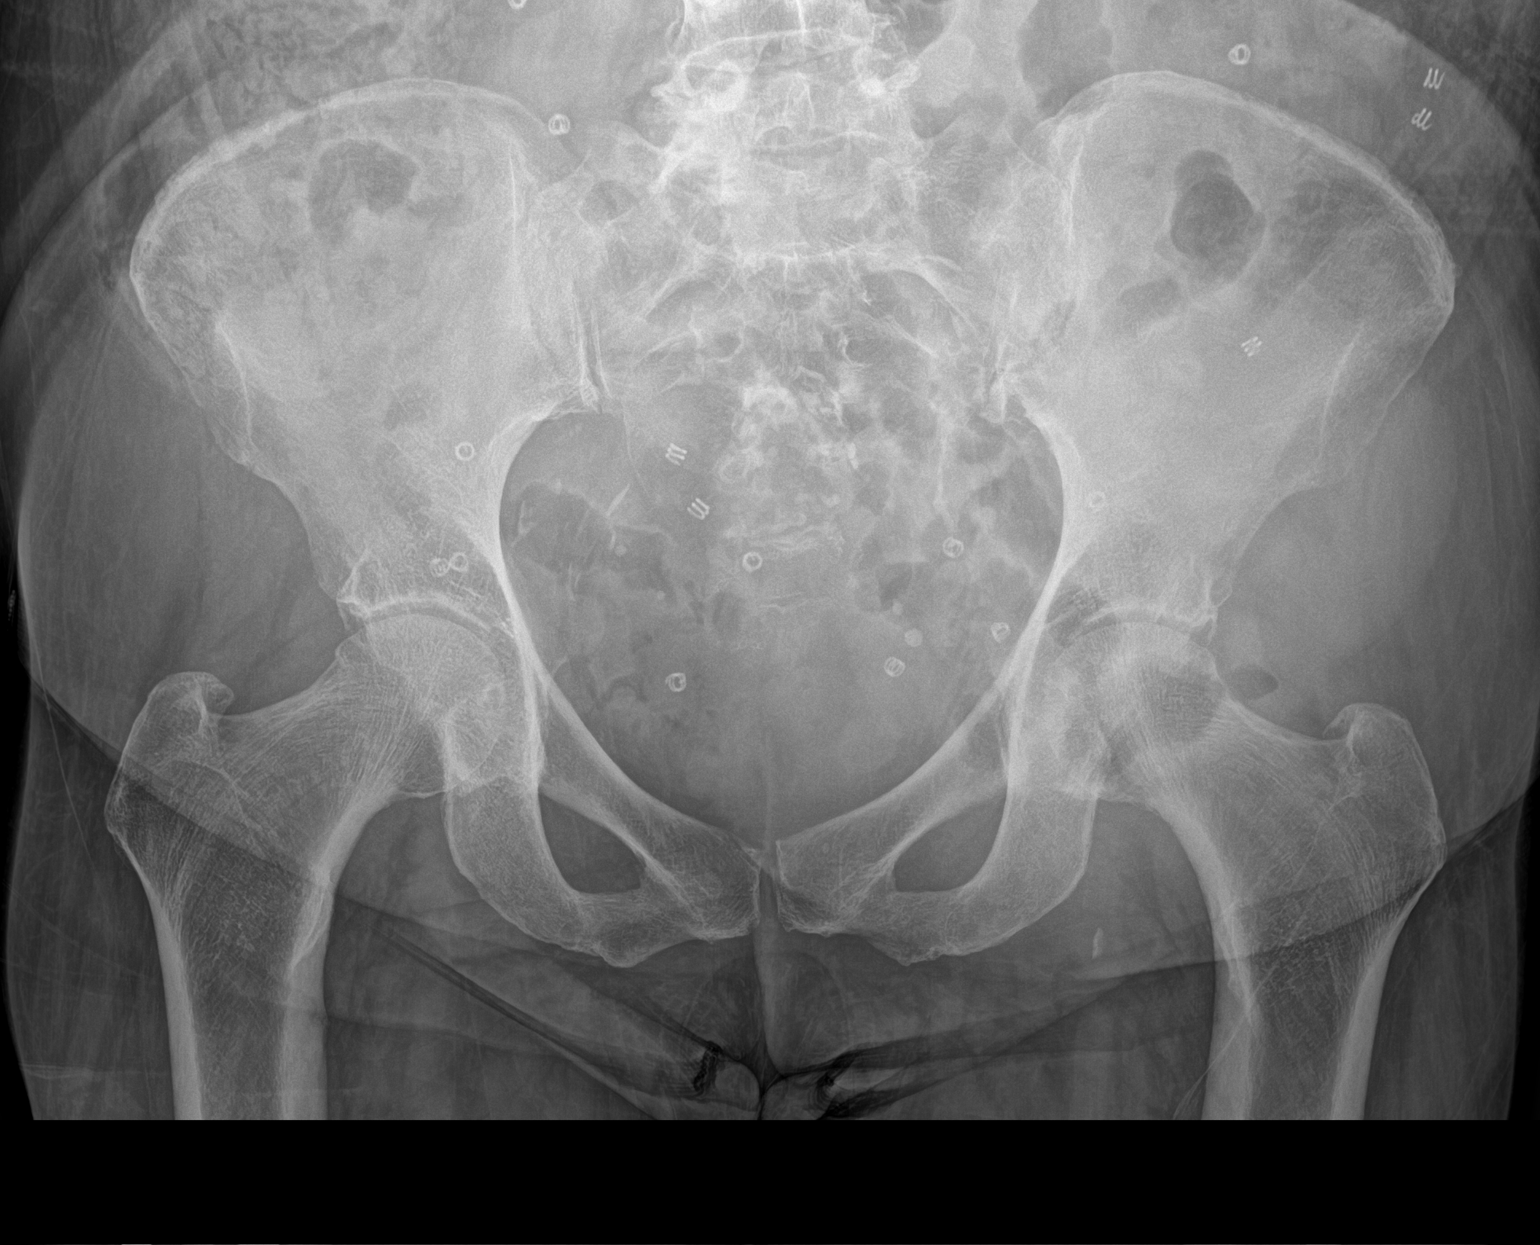

[1 of 1 positions shown; findings below may reference images not displayed]

FINDINGS: Degenerative changes are identified in both hips without significant
loss of joint space. Degenerative changes are identified in the
lower lumbar spine. No fractures or dislocations. No other acute
abnormalities.
IMPRESSION: Degenerative changes in the lower lumbar spine and hips as above.

## 2023-02-11 ENCOUNTER — Other Ambulatory Visit (INDEPENDENT_AMBULATORY_CARE_PROVIDER_SITE_OTHER): Payer: 59

## 2023-02-11 ENCOUNTER — Other Ambulatory Visit: Payer: Self-pay

## 2023-02-11 ENCOUNTER — Telehealth: Payer: Self-pay | Admitting: Internal Medicine

## 2023-02-11 ENCOUNTER — Telehealth: Payer: Self-pay | Admitting: Gastroenterology

## 2023-02-11 DIAGNOSIS — R58 Hemorrhage, not elsewhere classified: Secondary | ICD-10-CM

## 2023-02-11 LAB — CBC WITH DIFFERENTIAL/PLATELET
Basophils Absolute: 0.1 10*3/uL (ref 0.0–0.1)
Basophils Relative: 1.3 % (ref 0.0–3.0)
Eosinophils Absolute: 0.2 10*3/uL (ref 0.0–0.7)
Eosinophils Relative: 1.9 % (ref 0.0–5.0)
HCT: 41.5 % (ref 36.0–46.0)
Hemoglobin: 13.1 g/dL (ref 12.0–15.0)
Lymphocytes Relative: 26 % (ref 12.0–46.0)
Lymphs Abs: 2.5 10*3/uL (ref 0.7–4.0)
MCHC: 31.4 g/dL (ref 30.0–36.0)
MCV: 92.4 fL (ref 78.0–100.0)
Monocytes Absolute: 0.7 10*3/uL (ref 0.1–1.0)
Monocytes Relative: 7.7 % (ref 3.0–12.0)
Neutro Abs: 6 10*3/uL (ref 1.4–7.7)
Neutrophils Relative %: 63.1 % (ref 43.0–77.0)
Platelets: 506 10*3/uL — ABNORMAL HIGH (ref 150.0–400.0)
RBC: 4.49 Mil/uL (ref 3.87–5.11)
RDW: 15.2 % (ref 11.5–15.5)
WBC: 9.5 10*3/uL (ref 4.0–10.5)

## 2023-02-11 NOTE — Telephone Encounter (Signed)
Patient is advised again that she should go to the ER if she sees any additional blood in the ostomy. Advised we would like her to come today for CBC to insure that hemoglobin is stable. Patient verbalizes understanding and states she will try her best to get here today. Orders entered in EPIC.

## 2023-02-11 NOTE — Telephone Encounter (Signed)
Attempted to reach patient. No answer, no voicemail. Will attempt to reach out again at a later time.

## 2023-02-11 NOTE — Telephone Encounter (Signed)
Agree if she continues to have blood in the stool she needs to go the ED. If she has not seen any today she can monitor and go to the lab for CBC but otherwise if this persists she needs to go to the ED. Thanks

## 2023-02-11 NOTE — Telephone Encounter (Signed)
Patient returned your call, please advise. 

## 2023-02-11 NOTE — Telephone Encounter (Signed)
Inbound call from patient stating her colotomy bag was full of blood yesterday 10/30. States this happened 3 times and then had blood in her stool. Patient is concerned and wishing for a call to discuss further. Please advise, thank you.

## 2023-02-11 NOTE — Telephone Encounter (Signed)
FYI - Patient just wanted you to know that she was having some bleeding in her colostomy bag yesterday and today but she has spoken to her gastroenterologist.

## 2023-02-11 NOTE — Telephone Encounter (Signed)
Again attempted to reach patient. No answer, no voicemail. Will continue efforts to reach her.

## 2023-02-11 NOTE — Telephone Encounter (Signed)
Noted  

## 2023-02-11 NOTE — Telephone Encounter (Signed)
Patient calls stating that yesterday, when emptying her colostomy bag (hx rectal cancer) she had 3 distinct episodes with a large amount of blood in the ostomy bag. She estimates that she had up to 1 quart of blood each episode. There was no associated abdominal pain. Did have 1 additional episode of stool with some blood mixed in. Patient denies any further episodes of blood thus far since yesterday evening. No fever, no purulent drainage or any obvious problems with the skin surrounding the stoma. Patient denies any SOB, dizziness, heart palpitations or increased fatigue. Does note that she is having some mild lower pelvic cramping today.   Patient has been advised that if she has any further episodes of blood in her ostomy, she should report to the emergency room for more expedient evaluation.  Dr Adela Lank, additional thoughts?

## 2023-02-19 ENCOUNTER — Encounter: Payer: Self-pay | Admitting: Gastroenterology

## 2023-02-19 ENCOUNTER — Ambulatory Visit: Payer: 59 | Admitting: Gastroenterology

## 2023-02-19 VITALS — BP 130/62 | HR 65 | Ht 66.0 in | Wt 163.0 lb

## 2023-02-19 DIAGNOSIS — Z933 Colostomy status: Secondary | ICD-10-CM

## 2023-02-19 DIAGNOSIS — K5731 Diverticulosis of large intestine without perforation or abscess with bleeding: Secondary | ICD-10-CM

## 2023-02-19 DIAGNOSIS — K922 Gastrointestinal hemorrhage, unspecified: Secondary | ICD-10-CM | POA: Diagnosis not present

## 2023-02-19 DIAGNOSIS — Z85038 Personal history of other malignant neoplasm of large intestine: Secondary | ICD-10-CM

## 2023-02-19 NOTE — Progress Notes (Signed)
HPI :  67 year old female here for an acute visit for recent episode of blood coming out of her ostomy.  Recall she has history of rectal cancer diagnosed in November 2010, low anterior resection followed by radiation.  She subsequently had a diverting colostomy.  She currently has a double barrel colostomy with the proximal and distal limb coming from the transverse colon.  She has had radiation proctitis, incontinence surgery performed at [facility] in 2013.  She has had parastomal hernia and prolapsed colostomy repair by Dr. Maisie Fus in January 2015.  I performed for colonoscopy in 2016, unfortunately she had such severe changes in her diverted segment of radiation she had perforation when evaluating her left colon.  She required revision of her ostomy and surgery at that time.  She recovered and had been doing well since then.  I performed her last colonoscopy of her proximal colon only, given history of perforation in the past when evaluating the distal segment.  She has significant diverticulosis of the proximal colon, had 1 small adenoma removed at the time.  She normally does not have any blood in her ostomy that she sees at all and has regular stool output.  She states around October 30 she had filled up her ostomy bag 4 times with maroon-colored blood in and clots.  This persisted over the course of 2 days or so and then resolved on its own.  She has not seen any further bleeding with this.  She has not had any abdominal pain with it.  She is not on any anticoagulants, does not use any NSAIDs.  She denies any nausea or vomiting.  She eats well, no reflux symptoms.  Stool was not black or tarry etc.  She did not pass any blood per rectum etc. during this time.  We had her come to the lab and her hemoglobin is normal after having passed the blood.  Prior workup: Colonoscopy 01/09/15: The patient has a colostomy with an afferent and efferent limb. Only the proximal limb was able to be intubated and  traverse the ostomy. There was severe diverticulosis noted in the promixal limb. A 3mm polyp was noted within the appendiceal orifice and removed with cold forceps. No other polyps were appreciated in the proximal limb. The endoscope then intubated the anus and the rectum and sigmoid colon were examined. There was severe diverticulosis and evidence of diversion colitis within the left colon, as evidenced by pale mucosa with contact bleeding and friability. The preparation in the left colon was poor and not completely able to be examined up to the point of the ostomy given the poor preparation. On examination of the stomach, polyps were noted along the mucosa with friability, the largest over 1cm in size. These were not removed with the endoscope. Retroflexion was not performed due to a narrow rectal vault.  Procedure complicated by perforation from radiation changes of her left colon  Exploratory laparotomy, resection and revision of colostomy, appendectomy, omentectomy, repair incisional hernia and placement of pelvic drains, Dr. Jaclynn Guarneri 01-10-15    Colonoscopy 03/01/2020: - The terminal ileum appeared normal. - Multiple medium-mouthed diverticula were found in the transverse colon, proximal transverse colon, ascending colon and cecum. - A 3 mm polyp was found in the transverse colon. The polyp was sessile. The polyp was removed with a cold biopsy forceps. Resection and retrieval were complete. - The exam was otherwise without abnormality. Overall, the ostomy looks normal. The proximal limb evaluated the proximal transverse colon, ascending  colon and cecum. It was tortous with some restricted mobility likely due to adhesive disease. Patient has large ventral hernia on the left. I could not see the lumen of the distal limb through the colostomy to evaluate the left colon, the entrance to it is quite angulated and not able to be intubated. The rectum was not intubated given complications the patient had  during the last colonoscopy (friable tissue from radiation)  Surgical [P], colon, transverse, polyp - TUBULAR ADENOMA. 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   LAR surg 02/2009, chemo thru 09/2009   Tibial plateau fracture, left 01/19/2014     Past Surgical History:  Procedure Laterality Date   APPENDECTOMY N/A 01/09/2015   Procedure: APPENDECTOMY;  Surgeon: Glenna Fellows, MD;  Location: [facility];  Service: General;  Laterality: N/A;   COLONOSCOPY  01/09/2015   [name]   COLOSTOMY  2013   [facility]   FINGER FRACTURE SURGERY Left    and hand surgery   HERNIA REPAIR     LAPAROSCOPIC PARASTOMAL HERNIA N/A 05/12/2013   Procedure: LAPAROSCOPIC PARASTOMAL HERNIA;  Surgeon: Romie Levee, MD;  Location: [facility];  Service: General;  Laterality: N/A;   LAPAROTOMY N/A 01/09/2015   Procedure: EXPLORATORY LAPAROTOMY;  Surgeon: Glenna Fellows, MD;  Location: [facility];  Service: General;  Laterality: N/A;  RESECTION OF COLOSTOMY, CREATION OF NEW COLOSTOMY, VENTRAL HERNIA REPAIR WITH BIOLOGIC MESH,  LYSIS OF ADHESIONS , OMENTECTOMY   LOW ANTERIOR BOWEL RESECTION  03/07/09   MOUTH SURGERY  10/2009   PORTACATH PLACEMENT     and removal   Family History  Problem Relation Age of Onset   Colon cancer Mother 55   Diabetes Father    Cancer Sister    Stomach cancer Sister    Colon polyps Sister    Cirrhosis Brother    Pulmonary Hypertension Brother    Esophageal cancer Neg Hx     Rectal cancer Neg Hx    Social History   Tobacco Use   Smoking status: Every Day    Current packs/day: 0.25    Average packs/day: 0.3 packs/day for 40.0 years (10.0 ttl pk-yrs)    Types: Cigarettes   Smokeless tobacco: Never   Tobacco comments:    Pt. in project free program, motivated to stop smoking completely. Declined smoking cessation cone program.  Vaping Use   Vaping status: Never Used  Substance Use Topics   Alcohol use: Yes    Alcohol/week: 18.0 standard drinks of alcohol    Types: 14 Cans of beer, 4 Glasses of wine per week   Drug use: Yes    Frequency: 1.0 times per week    Types: Marijuana, Cocaine    Comment: Pt is using Marijuana; last use of cocaine nov 2014    Current Outpatient Medications  Medication Sig Dispense Refill  ACCU-CHEK GUIDE test strip USE AS DIRECTED 300 strip 3   ALPRAZolam (XANAX) 0.5 MG tablet TAKE 1 TABLET BY MOUTH TWICE  DAILY AS NEEDED FOR ANXIETY 180 tablet 0   Blood Glucose Monitoring Suppl (ACCU-CHEK GUIDE) w/Device KIT USE TO CHECK BLOOD SUGAR  ONCE DAILY AND AS NEEDED 1 kit 2   colchicine 0.6 MG tablet TAKE 2 TABLETS BY MOUTH FOR ONE DOSE AND THEN 1 TABLET  BY MOUTH 1 HOUR LATER FOR   GOUT FLARE 21 tablet 3   fenofibrate (TRICOR) 145 MG tablet Take 1 tablet (145 mg total) by mouth daily. 90 tablet 3   gabapentin (NEURONTIN) 600 MG tablet Take 1 tablet (600 mg total) by mouth 2 (two) times daily. 180 tablet 2   hydrochlorothiazide (HYDRODIURIL) 12.5 MG tablet Take 1 tablet (12.5 mg total) by mouth daily. 90 tablet 3   Lancets (ONETOUCH ULTRASOFT) lancets Use as directed to check sugars once daily and as needed.  Dx E11.43 100 each 12   lisinopril (ZESTRIL) 10 MG tablet TAKE 1 TABLET BY MOUTH DAILY 100 tablet 2   metFORMIN (GLUCOPHAGE) 500 MG tablet TAKE 1 TABLET BY MOUTH TWICE  DAILY WITH MEALS 200 tablet 2   Ostomy Supplies (ODOR ELIMINATOR) LIQD UAD 240 mL 0   rosuvastatin (CRESTOR) 5 MG tablet TAKE 1 TABLET BY MOUTH TWICE  WEEKLY 29  tablet 2   varenicline (CHANTIX CONTINUING MONTH PAK) 1 MG tablet Take 1 tablet (1 mg total) by mouth 2 (two) times daily. 180 tablet 0   vitamin B-12 (CYANOCOBALAMIN) 500 MCG tablet Take 1,500 mcg by mouth daily.     No current facility-administered medications for this visit.   Allergies  Allergen Reactions   Ampicillin Hives   Hydrocodone Itching    Can take Oxycodone and Codeine   Penicillins Other (See Comments)    Unknown reaction   Grapeseed Extract [Nutritional Supplements] Rash     Review of Systems: All systems reviewed and negative except where noted in HPI.   Lab Results  Component Value Date   WBC 9.5 02/11/2023   HGB 13.1 02/11/2023   HCT 41.5 02/11/2023   MCV 92.4 02/11/2023   PLT 506.0 (H) 02/11/2023     Physical Exam: BP 130/62   Pulse 65   Ht 5\' 6"  (1.676 m)   Wt 163 lb (73.9 kg)   BMI 26.31 kg/m  Constitutional: Pleasant,well-developed, female in no acute distress. Abdominal: Soft, nondistended, nontender. Colostomy looks okay - no superficial lacerations. Brown stool in bag.  Neurological: Alert and oriented to person place and time. Psychiatric: Normal mood and affect. Behavior is normal.   ASSESSMENT: 67 y.o. female here for assessment of the following  1. Lower GI bleed   2. Diverticulosis of colon with hemorrhage   3. 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place [facility])   4. History of colon cancer    Patient with roughly 24 to 48 hours worth of what appeared to be fresh red / maroon colored blood in the ostomy along with clots.  Painless.  Surprisingly hemoglobin remained normal throughout this.  Her symptoms have resolved and no longer bothering her.  She feels at baseline.  We discussed differential diagnosis.  She had no blood per rectum from the diverted segment of her colon.  I suspect she very likely had a diverticular bleed from the right side of her colon exiting the ostomy.  Small bowel source or upper tract possible however I think an upper GI bleed would  be very unusual  to present with that much blood and then have a normal hemoglobin.  We discussed where to go from here.  I offered her a colonoscopy to reassess her transverse and right colon given her last exam was about 3 years ago, although no concerning pathology was really noted there and the likelihood of this being diverticular in etiology is highest.  I discussed risks and benefits of colonoscopy and anesthesia with her.  Following discussion of this she is comfortable with monitoring for now and declines colonoscopy.  She is next due for routine exam in 2026.  If she has any recurrence of symptoms however in the interim she will contact me and do it sooner.  If she changes her mind otherwise she will let me know.  I asked her to contact me with any recurrence of bleeding otherwise moving forward.  PLAN: - offered colonoscopy vs. observation, she wants to observe for now and declined colonoscopy / endoscopic evaluation. Very likely she had a diverticular bleed from the R colon - low threshold to do a colonoscopy. She will let me know and agrees if any recurrence of bleeding and will contact me. Otherwise will plan on surveillance colonoscopy 02/2025 in light of her history of colon cancer.  Harlin Rain, MD [facility]

## 2023-02-23 ENCOUNTER — Telehealth: Payer: Self-pay | Admitting: Gastroenterology

## 2023-02-23 DIAGNOSIS — Z85038 Personal history of other malignant neoplasm of large intestine: Secondary | ICD-10-CM

## 2023-02-23 DIAGNOSIS — K5731 Diverticulosis of large intestine without perforation or abscess with bleeding: Secondary | ICD-10-CM

## 2023-02-23 DIAGNOSIS — Z933 Colostomy status: Secondary | ICD-10-CM

## 2023-02-23 DIAGNOSIS — K922 Gastrointestinal hemorrhage, unspecified: Secondary | ICD-10-CM

## 2023-02-23 MED ORDER — NA SULFATE-K SULFATE-MG SULF 17.5-3.13-1.6 GM/177ML PO SOLN: 1.0000 | Freq: Once | ORAL | 0 refills | Status: AC

## 2023-02-23 NOTE — Telephone Encounter (Signed)
Okay thanks Jan 

## 2023-02-23 NOTE — Telephone Encounter (Signed)
Inbound call from patient, would like to discuss proceeding with colonoscopy. States she spoke to family members and decided this was the best plan of action for her.

## 2023-02-23 NOTE — Telephone Encounter (Signed)
Called patient. She would like to proceed with colonoscopy.  Scheduled her for Wed, 03-04-23.  Instructions to be mailed and sent to MyChart. Patient would like Suprep and it will be sent to [facility] in [place].  She will call back with any questions.

## 2023-02-24 ENCOUNTER — Encounter: Payer: Self-pay | Admitting: Gastroenterology

## 2023-02-26 ENCOUNTER — Other Ambulatory Visit: Payer: Self-pay | Admitting: Internal Medicine

## 2023-03-04 ENCOUNTER — Ambulatory Visit: Payer: 59 | Admitting: Gastroenterology

## 2023-03-04 ENCOUNTER — Encounter: Payer: Self-pay | Admitting: Gastroenterology

## 2023-03-04 VITALS — BP 146/71 | HR 69 | Temp 96.9°F | Resp 20 | Ht 66.0 in | Wt 163.0 lb

## 2023-03-04 DIAGNOSIS — K922 Gastrointestinal hemorrhage, unspecified: Secondary | ICD-10-CM | POA: Diagnosis not present

## 2023-03-04 DIAGNOSIS — D123 Benign neoplasm of transverse colon: Secondary | ICD-10-CM | POA: Diagnosis not present

## 2023-03-04 MED ORDER — SODIUM CHLORIDE 0.9 % IV SOLN: 500.0000 mL | Freq: Once | INTRAVENOUS | Status: DC

## 2023-03-04 NOTE — Progress Notes (Signed)
Sedate, gd SR, tolerated procedure well, VSS, report to RN 

## 2023-03-04 NOTE — Progress Notes (Signed)
History and Physical Interval Note: seen on 11/7. See that note for details. Remote history of rectal cancer dx Nov 201, s/p resection, diverting colostomy. Has double barrel transverse colostomy. History of radiation proctitis and colonic perforation of diverted segment during colonoscopy in 2016.   She has had colonoscopy since that time. Cannot evaluate the left colon due to friability / radiation changes and that it has not been able to be access through the colostomy. R / transverse colon has been evaluated, last 02/2020. She had recent episodes of blood from the colonoscopy. Thought to be diverticular in nature, colonoscopy to exclude other pathology.  She has not had much of any significant bleeding since I have seen her. Doing better. We discussed risks / benefits of the exam. Only planning on evaluating her right colon with this exam, given she has very friable tissue in the left colon from diversion / radiation changes and perforation in the past.     03/04/2023 4:03 PM  Wafa Graham-Montgomery  has presented today for endoscopic procedure(s), with the diagnosis of  Encounter Diagnosis  Name Primary?   Lower GI bleed Yes  .  The various methods of evaluation and treatment have been discussed with the patient and/or family. After consideration of risks, benefits and other options for treatment, the patient has consented to  the endoscopic procedure(s).   The patient's history has been reviewed, patient examined, no change in status, stable for surgery.  I have reviewed the patient's chart and labs.  Questions were answered to the patient's satisfaction.    Harlin Rain, MD [facility]

## 2023-03-04 NOTE — Op Note (Addendum)
[facility] Patient Name: [name] Procedure Date: 03/04/2023 3:56 PM MRN: [id number] Endoscopist: Viviann Spare P. Adela Lank , MD, [id number] Age: 67 Referring MD:  Date of Birth: [birth year] Gender: Female Account #: [id number] Procedure:                Colonoscopy Indications:              Blood per ostomy - end of October, no recurrence                            since office visit. History of rectal cancer s/p                            resection and double barrel transverse colostomy                            with radiation changes to left colon / diversion                            colitis Medicines:                Monitored Anesthesia Care Procedure:                Pre-Anesthesia Assessment:                           - Prior to the procedure, a History and Physical                            was performed, and patient medications and                            allergies were reviewed. The patient's tolerance of                            previous anesthesia was also reviewed. The risks                            and benefits of the procedure and the sedation                            options and risks were discussed with the patient.                            All questions were answered, and informed consent                            was obtained. Prior Anticoagulants: The patient has                            taken no anticoagulant or antiplatelet agents. ASA                            Grade Assessment: II - A patient with mild systemic  disease. After reviewing the risks and benefits,                            the patient was deemed in satisfactory condition to                            undergo the procedure.                           After obtaining informed consent, the colonoscope                            was passed under direct vision. Throughout the                            procedure, the patient's blood pressure,  pulse, and                            oxygen saturations were monitored continuously. The                            [id number]-HQ190L Colonoscope [id number] was introduced                            through the transverse colostomy and advanced to                            the the terminal ileum, with identification of the                            appendiceal orifice and IC valve. The colonoscopy                            was performed without difficulty. The patient                            tolerated the procedure well. The quality of the                            bowel preparation was good. The terminal ileum,                            ileocecal valve, appendiceal orifice, and rectum                            were photographed. Scope In: 4:16:54 PM Scope Out: 4:31:41 PM Scope Withdrawal Time: 0 hours 11 minutes 47 seconds  Total Procedure Duration: 0 hours 14 minutes 47 seconds  Findings:                 The terminal ileum appeared normal.                           Many medium-mouthed diverticula were found in the  transverse colon and ascending colon.                           A 3 mm polyp was found in the proximal transverse                            colon. The polyp was sessile. The polyp was removed                            with a cold snare. Resection and retrieval were                            complete.                           The entrance to the transverse colon through the                            colostomy is tortous and somewhat restricted. It                            took some time with water immersion to open the                            lumen and navigate this area slowly. The exam was                            otherwise without abnormality. The limb to the                            diverted left colon was not able to be intubated                            (which has been the case in the past with her                             anatomy). Diverted left colon not examined from the                            rectum due to history of perforation of the left                            colon in the past in the setting of friable colonic                            mucosa in that area. Complications:            No immediate complications. Estimated blood loss:                            Minimal. Estimated Blood Loss:     Estimated blood loss was minimal. Impression:               -  The examined portion of the ileum was normal.                           - Diverticulosis in the transverse colon and in the                            ascending colon.                           - One 3 mm polyp in the proximal transverse colon,                            removed with a cold snare. Resected and retrieved.                           - The examination was otherwise normal.                           Suspect the patient had diverticular bleeding from                            the right / transverse colon that has since                            resolved on its own. No concerning pathology on                            this exam. Recommendation:           - Patient has a contact number available for                            emergencies. The signs and symptoms of potential                            delayed complications were discussed with the                            patient. Return to normal activities tomorrow.                            Written discharge instructions were provided to the                            patient.                           - Resume previous diet.                           - Continue present medications.                           - Await pathology results.                           -  Anticipate repeat colonoscopy in 5 years for                            surveillance given history of colon cancer.                           - Contact me if recurrent bleeding symptoms Elmor Kost P. Ariba Lehnen,  MD 03/04/2023 4:42:09 PM This report has been signed electronically.

## 2023-03-04 NOTE — Progress Notes (Signed)
Called to room to assist during endoscopic procedure.  Patient ID and intended procedure confirmed with present staff. Received instructions for my participation in the procedure from the performing physician.  

## 2023-03-04 NOTE — Progress Notes (Signed)
Last used marijuana on Monday 03/02/23

## 2023-03-04 NOTE — Patient Instructions (Signed)

## 2023-03-05 ENCOUNTER — Telehealth: Payer: Self-pay | Admitting: *Deleted

## 2023-03-05 ENCOUNTER — Telehealth: Payer: Self-pay

## 2023-03-05 NOTE — Telephone Encounter (Signed)
Patient returned call stating that she is doing well, and is requesting a call to discuss some questions she has. Please advise.

## 2023-03-05 NOTE — Telephone Encounter (Signed)
  Follow up Call-     03/04/2023    3:24 PM  Call back number  Post procedure Call Back phone  # [phone]  Permission to leave phone message Yes   [facility]

## 2023-03-05 NOTE — Telephone Encounter (Signed)
Attempted to call patient back but no answer, left a message on her voicemail

## 2023-03-09 LAB — SURGICAL PATHOLOGY

## 2023-03-15 ENCOUNTER — Encounter: Payer: Self-pay | Admitting: Gastroenterology

## 2023-03-23 IMAGING — CT CT RENAL STONE PROTOCOL
2 of 4 series · 16 of 46 positions shown, 18 images · non-contrast
Comparison: Previous exams.

CLINICAL DATA: Pt stating having left flank pain Hx of colostomy,
colorectal, stones

EXAM:
CT ABDOMEN AND PELVIS WITHOUT CONTRAST
TECHNIQUE: Multidetector CT imaging of the abdomen and pelvis was performed
following the standard protocol without IV contrast.

[Series 2: axial st · axial · 0.86mm/px · z∈[-716,-342]mm · 13 of 86 slices shown, 15 images]
[im 6/86  soft-tissue]
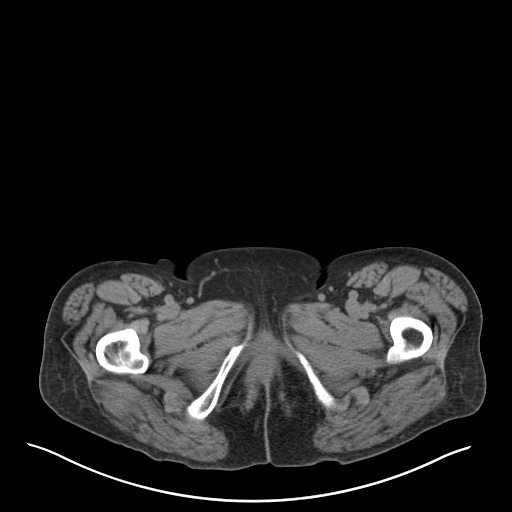
[im 6/86  bone]
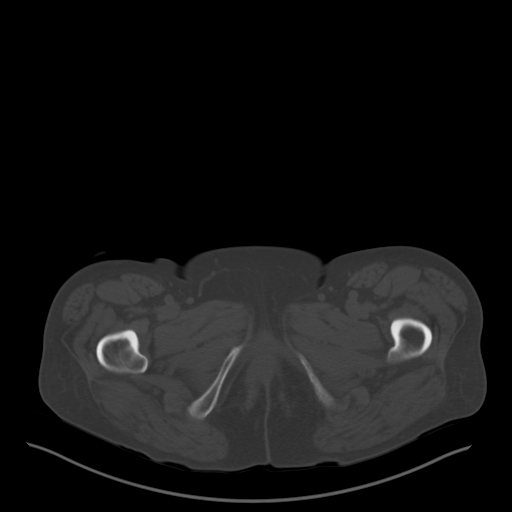
[im 11/86  soft-tissue]
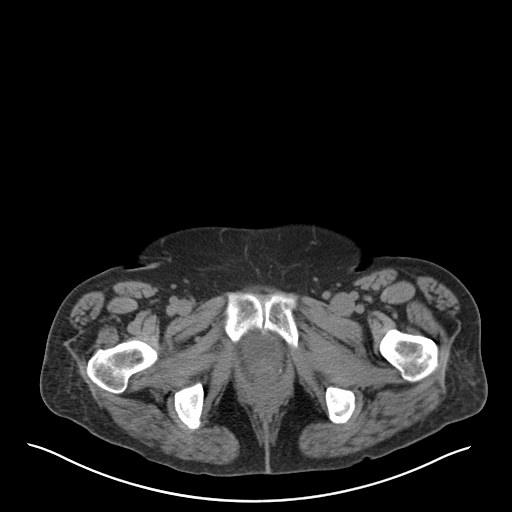
[im 21/86  soft-tissue]
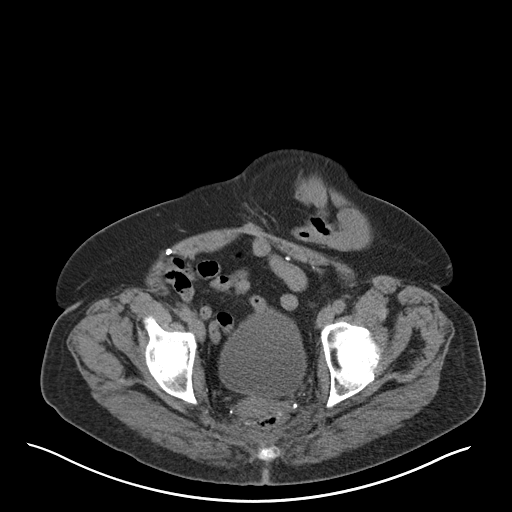
[im 26/86  soft-tissue]
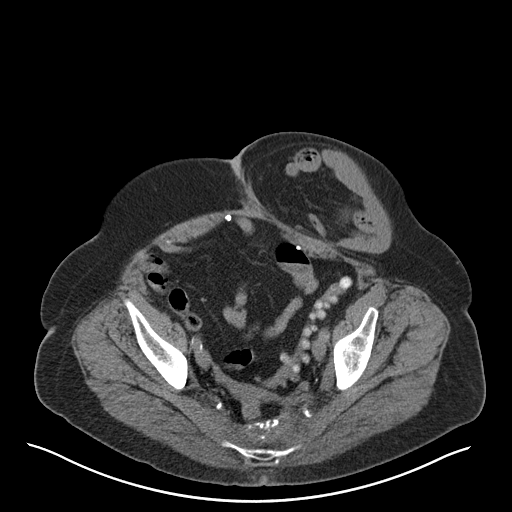
[im 31/86  soft-tissue]
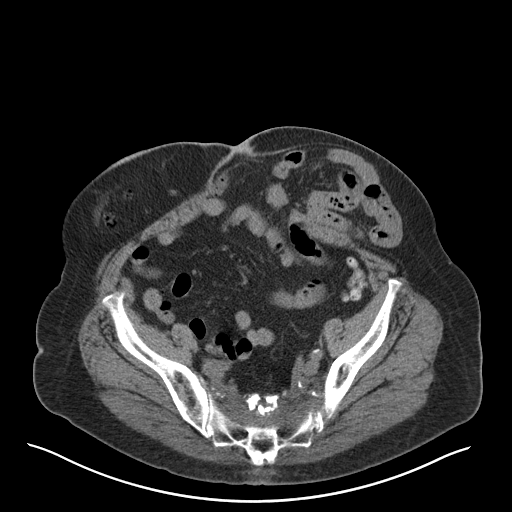
[im 36/86  soft-tissue]
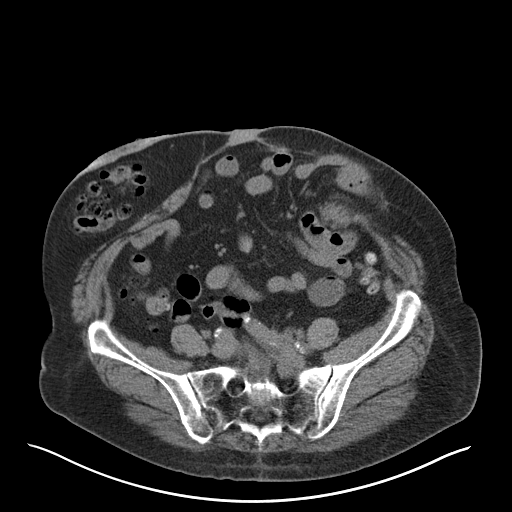
[im 46/86  soft-tissue]
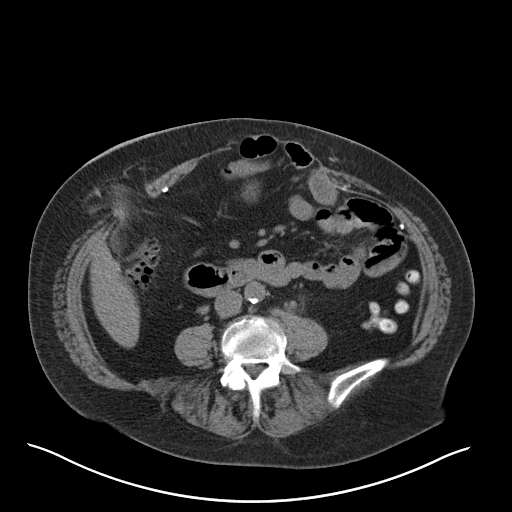
[im 51/86  soft-tissue]
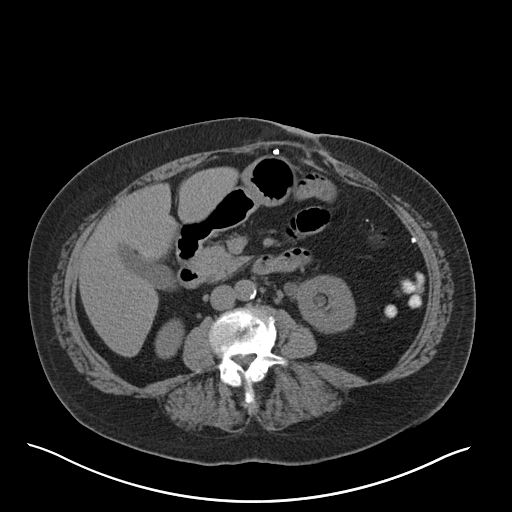
[im 56/86  soft-tissue]
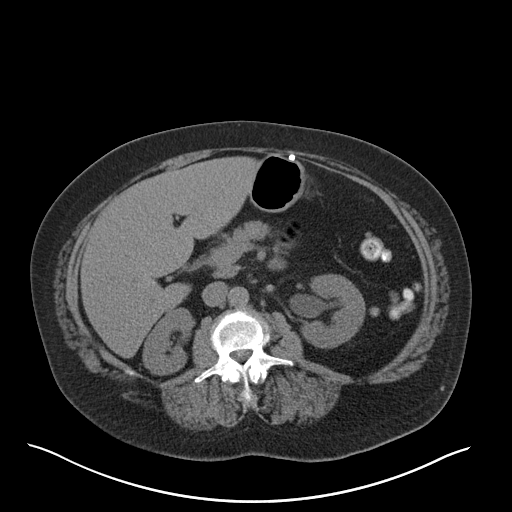
[im 56/86  bone]
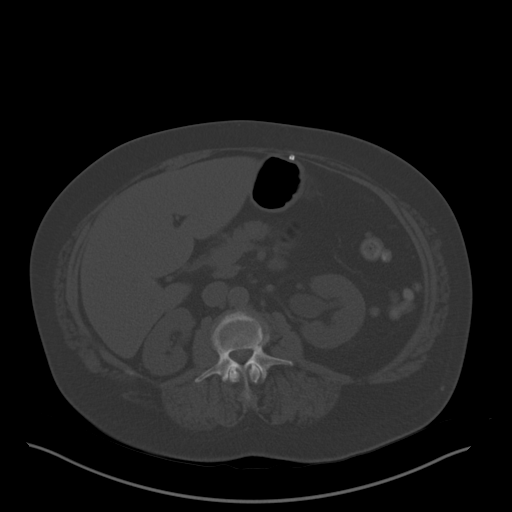
[im 61/86  soft-tissue]
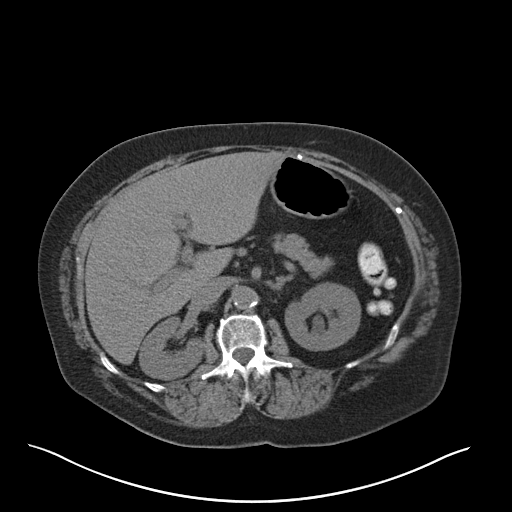
[im 66/86  soft-tissue]
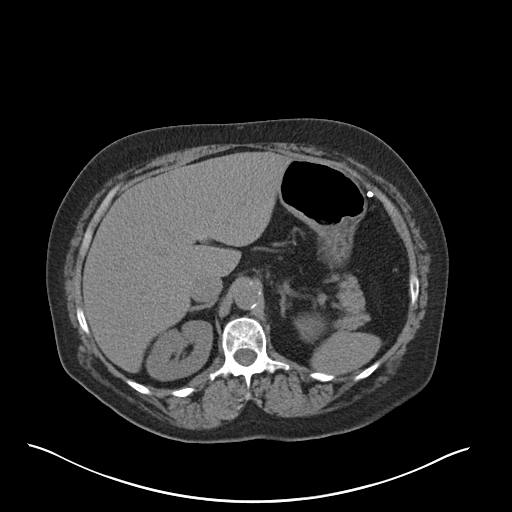
[im 76/86  soft-tissue]
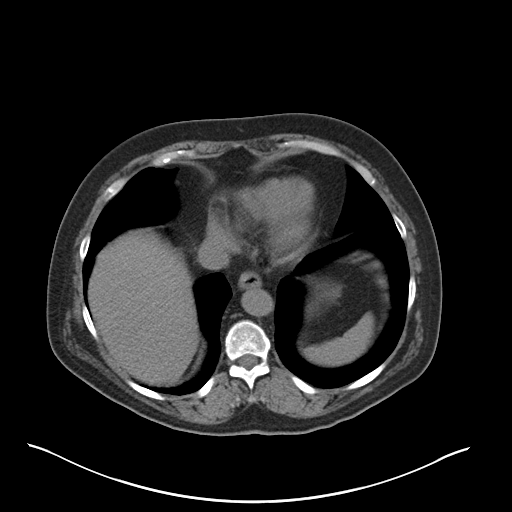
[im 81/86  soft-tissue]
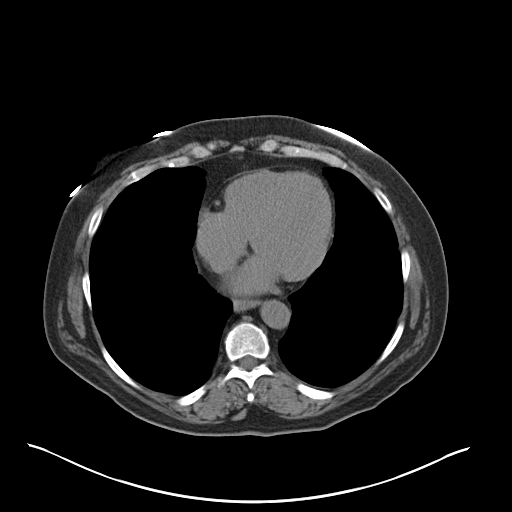

[Series 5: coronal · coronal · 0.72mm/px · 3 of 161 slices shown]
[im 54/161  soft-tissue]
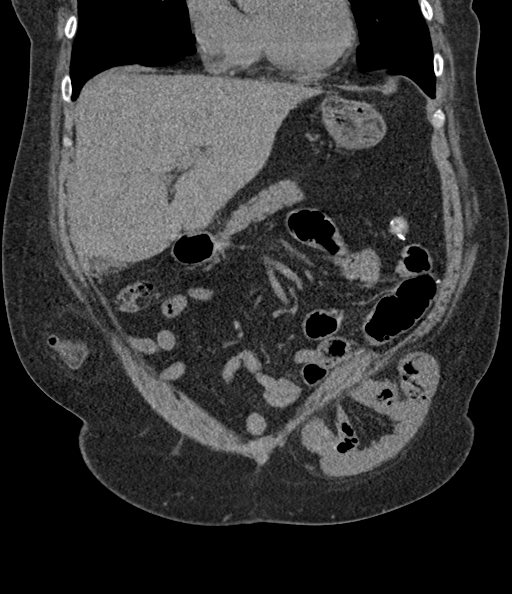
[im 72/161  soft-tissue]
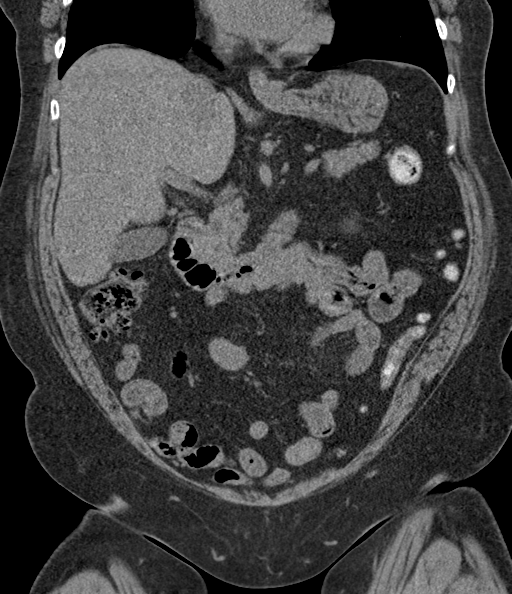
[im 89/161  soft-tissue]
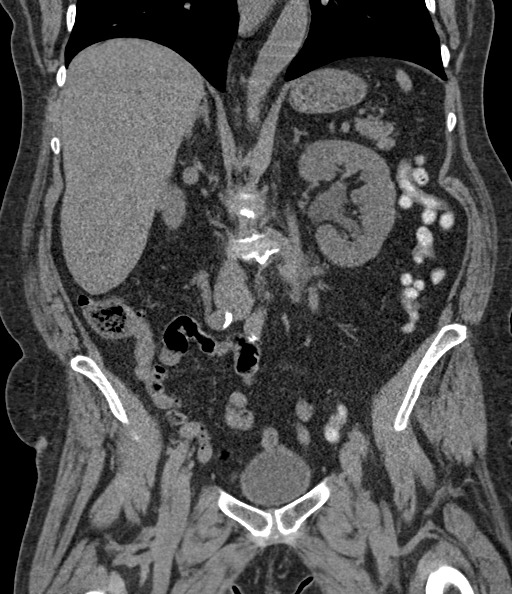

[16 of 46 positions shown; findings below may reference images not displayed]

FINDINGS: Lower chest: No acute abnormality.

Evaluation of the abdominal viscera limited by the lack of IV
contrast.

Hepatobiliary: No focal liver abnormality is seen. Normal appearance
of the gallbladder.

Pancreas: Unremarkable. No surrounding inflammatory changes.

Spleen: Normal in size without focal abnormality.

Adrenals/Urinary Tract: Adrenal glands are unremarkable. Normal
appearance of the right kidney no hydronephrosis or renal calculi.
No large renal mass. There is mild left hydronephrosis and
ureterectasis secondary to an obstructing stone in the distal left
ureter measuring 0.4 cm (series 5, image 108). No additional renal
calculi identified. Normal appearance of the urinary bladder.

Stomach/Bowel: Stomach is within normal limits. Status post
transverse colectomy with right upper quadrant colostomy. Numerous
colonic diverticula without evidence of diverticulitis. No evidence
of bowel obstruction or other inflammatory process. Presacral
thickening with calcification, similar to prior.

Vascular/Lymphatic: Aortic atherosclerosis without aneurysm. No
enlarged abdominal or pelvic lymph nodes.

Reproductive: Prostate is unremarkable.

Other: Large left paraumbilical ventral hernia containing multiple
loops of nondilated small bowel. Multiple surgical clips along the
anterior abdominal wall.

Musculoskeletal: Multilevel degenerative disc disease in the lumbar
spine worse at L5-S1.
IMPRESSION: Mild left hydronephrosis and ureterectasis secondary to an
obstructing 0.4 cm stone in the distal left ureter.

Aortic Atherosclerosis ([id number].K).

## 2023-03-29 ENCOUNTER — Other Ambulatory Visit: Payer: Self-pay | Admitting: Internal Medicine

## 2023-03-29 DIAGNOSIS — F419 Anxiety disorder, unspecified: Secondary | ICD-10-CM

## 2023-04-17 ENCOUNTER — Other Ambulatory Visit: Payer: Self-pay | Admitting: Internal Medicine

## 2023-05-21 ENCOUNTER — Ambulatory Visit: Payer: Self-pay | Admitting: Internal Medicine

## 2023-05-21 NOTE — Telephone Encounter (Signed)
 Copied from CRM [id number]. Topic: Clinical - Red Word Triage >> May 21, 2023  1:42 PM Pinkey ORN wrote: Red Word that prompted transfer to Nurse Triage: Severe Cramping / Left Leg & Foot   Chief Complaint: intermittent bilateral calf cramping Symptoms: radiates to foot causing foot to flex Frequency: ongoing for months but worsening in the past week Pertinent Negatives: Patient denies swelling or redness  Disposition: [] ED /[] Urgent Care (no appt availability in office) / [x] Appointment(In office/virtual)/ []  [place] [facility]/ [] Home Care/ [] Refused Recommended Disposition /[] [place] [facility]/ []  Follow-up with PCP Additional Notes: The patient reported that for the past few months she has experienced intermittent leg cramping in both her left and right calf.  The cramping starts in her calf and goes down to her foot causing her foot to flex upward and her toes to flex.  The muscle cramps are worse at night. She has a history of neuropathy and takes gabapentin  1800 mg daily.  She stated that she is trying to stay hydrated and is unsure if there is any other treatment recommendation to assist with this issue.  She was scheduled with Dr. Geofm at her earliest available.    Reason for Disposition  Leg pain or muscle cramp is a chronic symptom (recurrent or ongoing AND present > 4 weeks)  Answer Assessment - Initial Assessment Questions 1. ONSET: When did the pain start?      Off and on for past few months; comes and goes  2. LOCATION: Where is the pain located?      Left calf down to foot cramping causing foot to flex  Right calf down to foot  1800 mg gabapentin  neuropathy  3. PAIN: How bad is the pain?    (Scale 1-10; or mild, moderate, severe)   -  MILD (1-3): doesn't interfere with normal activities    -  MODERATE (4-7): interferes with normal activities (e.g., work or school) or awakens from sleep, limping    -  SEVERE (8-10): excruciating pain, unable to do any normal  activities, unable to walk     5/10 Wakes her up in the middle of the night 10 plus  4. WORK OR EXERCISE: Has there been any recent work or exercise that involved this part of the body?      Laying in bed for the past day due to upper respiratory infection  5. CAUSE: What do you think is causing the leg pain?     Unknown possibly due to not drinking much 6. OTHER SYMPTOMS: Do you have any other symptoms? (e.g., chest pain, back pain, breathing difficulty, swelling, rash, fever, numbness, weakness)     Congestion due to recent cold  Protocols used: Leg Pain-A-AH

## 2023-05-26 ENCOUNTER — Ambulatory Visit: Payer: 59 | Admitting: Internal Medicine

## 2023-05-28 ENCOUNTER — Ambulatory Visit: Payer: Self-pay | Admitting: Internal Medicine

## 2023-05-28 NOTE — Telephone Encounter (Signed)
Copied from CRM [id number]. Topic: Clinical - Red Word Triage >> May 28, 2023 11:48 AM Adele Barthel wrote: Red Word that prompted transfer to Nurse Triage:   Symptoms started over a week ago.  Had headaches that have resolved Dry cough that has progressed a wet cough Heavy congestion Tried over-the-counter meds, honey, and hot tea and not beneficial. Blood pressure is normal.  Chief Complaint: moist productive cough Symptoms: cough, clear sputum Frequency: constant Pertinent Negatives: Patient denies fever, sob, runny nose Disposition: [] ED /[] Urgent Care (no appt availability in office) / [] Appointment(In office/virtual)/ []  [place] [facility]/ [] Home Care/ [] Refused Recommended Disposition /[] [place] [facility]/ [x]  Follow-up with PCP Additional Notes: Patient would like call back from office.  States she has tried everything to get rid of cough.  Would like some recommendations for OTC medication.  Office to call patient back.  She has apt on 06/08/23.   Reason for Disposition  [1] Nasal discharge AND [2] present > 10 days  Answer Assessment - Initial Assessment Questions 1. ONSET: "When did the cough begin?"      A week ago 2. SEVERITY: "How bad is the cough today?"      severe 3. SPUTUM: "Describe the color of your sputum" (none, dry cough; clear, white, yellow, green)     Clear and a lot of it 4. HEMOPTYSIS: "Are you coughing up any blood?" If so ask: "How much?" (flecks, streaks, tablespoons, etc.)     denies 5. DIFFICULTY BREATHING: "Are you having difficulty breathing?" If Yes, ask: "How bad is it?" (e.g., mild, moderate, severe)    - MILD: No SOB at rest, mild SOB with walking, speaks normally in sentences, can lie down, no retractions, pulse < 100.    - MODERATE: SOB at rest, SOB with minimal exertion and prefers to sit, cannot lie down flat, speaks in phrases, mild retractions, audible wheezing, pulse 100-120.    - SEVERE: Very SOB at rest, speaks in single words,  struggling to breathe, sitting hunched forward, retractions, pulse > 120      denies 6. FEVER: "Do you have a fever?" If Yes, ask: "What is your temperature, how was it measured, and when did it start?"     denies 7. CARDIAC HISTORY: "Do you have any history of heart disease?" (e.g., heart attack, congestive heart failure)      HTN,  8. LUNG HISTORY: "Do you have any history of lung disease?"  (e.g., pulmonary embolus, asthma, emphysema)     denies 9. PE RISK FACTORS: "Do you have a history of blood clots?" (or: recent major surgery, recent prolonged travel, bedridden)     denies 10. OTHER SYMPTOMS: "Do you have any other symptoms?" (e.g., runny nose, wheezing, chest pain)       Denies.  11. PREGNANCY: "Is there any chance you are pregnant?" "When was your last menstrual period?"       na 12. TRAVEL: "Have you traveled out of the country in the last month?" (e.g., travel history, exposures)       na  Protocols used: Cough - Acute Productive-A-AH

## 2023-05-28 NOTE — Telephone Encounter (Signed)
Spoke with patient and recommended Deslymn for cough.  She did not want to make an appointment just yet and will continue to treat with OTC.  Will follow up if not better by next week.

## 2023-06-02 ENCOUNTER — Ambulatory Visit: Payer: 59

## 2023-06-02 ENCOUNTER — Encounter: Payer: 59 | Admitting: Internal Medicine

## 2023-06-04 ENCOUNTER — Other Ambulatory Visit: Payer: Self-pay | Admitting: Internal Medicine

## 2023-06-06 ENCOUNTER — Other Ambulatory Visit: Payer: Self-pay | Admitting: Internal Medicine

## 2023-06-07 ENCOUNTER — Encounter: Payer: Self-pay | Admitting: Internal Medicine

## 2023-06-07 DIAGNOSIS — N183 Chronic kidney disease, stage 3 unspecified: Secondary | ICD-10-CM | POA: Insufficient documentation

## 2023-06-07 DIAGNOSIS — N1832 Chronic kidney disease, stage 3b: Secondary | ICD-10-CM | POA: Insufficient documentation

## 2023-06-07 NOTE — Patient Instructions (Addendum)
      Blood work was ordered.       Medications changes include :   None    A referral was ordered and someone will call you to schedule an appointment.     Return in about 6 months (around 12/06/2023) for follow up.

## 2023-06-07 NOTE — Progress Notes (Signed)
 Subjective:    Patient ID: [name], female    DOB: 04/19/1955, 68 y.o.   MRN: [id number]      HPI [name] is here for a Physical exam and her chronic medical problems.    CKD newbut not new - disucss  CrCl 30-59: 200-700 mg bid of gabapnetin    Medications and allergies reviewed with patient and updated if appropriate.  Current Outpatient Medications on File Prior to Visit  Medication Sig Dispense Refill  . ACCU-CHEK GUIDE test strip USE AS DIRECTED 300 strip 3  . Blood Glucose Monitoring Suppl (ACCU-CHEK GUIDE) w/Device KIT USE TO CHECK BLOOD SUGAR  ONCE DAILY AND AS NEEDED 1 kit 2  . colchicine  0.6 MG tablet TAKE 2 TABLETS BY MOUTH FOR ONE DOSE AND THEN 1 TABLET  BY MOUTH 1 HOUR LATER FOR   GOUT FLARE 21 tablet 3  . hydrochlorothiazide  (HYDRODIURIL ) 12.5 MG tablet TAKE 1 TABLET BY MOUTH DAILY 100 tablet 2  . Lancets (ONETOUCH ULTRASOFT) lancets Use as directed to check sugars once daily and as needed.  Dx E11.43 100 each 12  . lisinopril  (ZESTRIL ) 10 MG tablet TAKE 1 TABLET BY MOUTH DAILY 100 tablet 2  . Ostomy Supplies (ODOR ELIMINATOR) LIQD UAD 240 mL 0  . vitamin B-12 (CYANOCOBALAMIN ) 500 MCG tablet Take 1,500 mcg by mouth daily.     No current facility-administered medications on file prior to visit.    Review of Systems     Objective:  There were no vitals filed for this visit. There were no vitals filed for this visit. There is no height or weight on file to calculate BMI.  BP Readings from Last 3 Encounters:  07/10/23 98/60  03/04/23 (!) 146/71  02/19/23 130/62    Wt Readings from Last 3 Encounters:  07/10/23 158 lb (71.7 kg)  06/26/23 163 lb (73.9 kg)  03/04/23 163 lb (73.9 kg)       Physical Exam Constitutional: She appears well-developed and well-nourished. No distress.  HENT:  Head: Normocephalic and atraumatic.  Right Ear: External ear normal. Normal ear canal and TM Left Ear: External ear normal.  Normal ear canal and  TM Mouth/Throat: Oropharynx is clear and moist.  Eyes: Conjunctivae normal.  Neck: Neck supple. No tracheal deviation present. No thyromegaly present.  No carotid bruit  Cardiovascular: Normal rate, regular rhythm and normal heart sounds.   No murmur heard.  No edema. Pulmonary/Chest: Effort normal and breath sounds normal. No respiratory distress. She has no wheezes. She has no rales.  Breast: deferred   Abdominal: Soft. She exhibits no distension. There is no tenderness.  Lymphadenopathy: She has no cervical adenopathy.  Skin: Skin is warm and dry. She is not diaphoretic.  Psychiatric: She has a normal mood and affect. Her behavior is normal.     Lab Results  Component Value Date   WBC 10.2 07/10/2023   HGB 12.7 07/10/2023   HCT 39.7 07/10/2023   PLT 459.0 (H) 07/10/2023   GLUCOSE 118 (H) 07/10/2023   CHOL 139 07/10/2023   TRIG 85.0 07/10/2023   HDL 43.60 07/10/2023   LDLDIRECT 51.0 12/13/2018   LDLCALC 78 07/10/2023   ALT 11 07/10/2023   AST 17 07/10/2023   NA 141 07/10/2023   K 5.2 No hemolysis seen (H) 07/10/2023   CL 112 07/10/2023   CREATININE 1.35 (H) 07/10/2023   BUN 47 (H) 07/10/2023   CO2 22 07/10/2023   TSH 0.82 10/09/2015   INR 0.91 04/01/2013  HGBA1C 6.5 07/10/2023   MICROALBUR <0.7 07/10/2023         Assessment & Plan:   Physical exam: Screening blood work  ordered Exercise   Weight   Substance abuse  none   Reviewed recommended immunizations.   Health Maintenance  Topic Date Due  . OPHTHALMOLOGY EXAM  08/12/2021  . COVID-19 Vaccine (3 - 2024-25 season) 12/14/2022  . Zoster Vaccines- Shingrix (1 of 2) 10/10/2023 (Originally 06/09/2006)  . FOOT EXAM  10/15/2023  . INFLUENZA VACCINE  11/13/2023  . HEMOGLOBIN A1C  01/10/2024  . Medicare Annual Wellness (AWV)  06/25/2024  . Diabetic kidney evaluation - eGFR measurement  07/09/2024  . Diabetic kidney evaluation - Urine ACR  07/09/2024  . MAMMOGRAM  07/14/2024  . DEXA SCAN  08/28/2024  .  Colonoscopy  03/03/2028  . DTaP/Tdap/Td (3 - Tdap) 03/12/2032  . Pneumonia Vaccine 43+ Years old  Completed  . Hepatitis C Screening  Completed  . HPV VACCINES  Aged Out  . Meningococcal B Vaccine  Aged Out          See Problem List for Assessment and Plan of chronic medical problems.     This encounter was created in error - please disregard.

## 2023-06-08 ENCOUNTER — Encounter: Payer: 59 | Admitting: Internal Medicine

## 2023-06-08 DIAGNOSIS — E1143 Type 2 diabetes mellitus with diabetic autonomic (poly)neuropathy: Secondary | ICD-10-CM

## 2023-06-08 DIAGNOSIS — N1831 Chronic kidney disease, stage 3a: Secondary | ICD-10-CM

## 2023-06-08 DIAGNOSIS — I10 Essential (primary) hypertension: Secondary | ICD-10-CM

## 2023-06-08 DIAGNOSIS — F419 Anxiety disorder, unspecified: Secondary | ICD-10-CM

## 2023-06-08 DIAGNOSIS — E785 Hyperlipidemia, unspecified: Secondary | ICD-10-CM

## 2023-06-08 DIAGNOSIS — Z933 Colostomy status: Secondary | ICD-10-CM

## 2023-06-08 DIAGNOSIS — G622 Polyneuropathy due to other toxic agents: Secondary | ICD-10-CM

## 2023-06-08 DIAGNOSIS — Z0000 Encounter for general adult medical examination without abnormal findings: Secondary | ICD-10-CM

## 2023-06-08 DIAGNOSIS — E538 Deficiency of other specified B group vitamins: Secondary | ICD-10-CM

## 2023-06-08 DIAGNOSIS — Z8739 Personal history of other diseases of the musculoskeletal system and connective tissue: Secondary | ICD-10-CM

## 2023-06-08 NOTE — Assessment & Plan Note (Signed)
 Chronic Blood pressure well controlled CMP, cbc Continue lisinopril 10 mg daily, hydrochlorothiazide 25 mg daily

## 2023-06-08 NOTE — Assessment & Plan Note (Signed)
Chronic Controlled, Stable Continue alprazolam 0.5 mg twice daily as needed

## 2023-06-08 NOTE — Assessment & Plan Note (Signed)
 Chronic Secondary to surgery for rectal cancer Continue home care Will order all supplies as needed

## 2023-06-08 NOTE — Assessment & Plan Note (Signed)
Chronic Controlled Continue gabapentin 600 mg 3 times daily 

## 2023-06-08 NOTE — Assessment & Plan Note (Signed)
 Chronic Advised avoiding NSAIDs, maintaining good water intake Stressed good blood pressure control, sugar control CBC, CMP

## 2023-06-08 NOTE — Assessment & Plan Note (Signed)
 Chronic Infrequent flares Deferred daily preventative medication Continue colchicine as needed

## 2023-06-08 NOTE — Assessment & Plan Note (Signed)
 Chronic  Lab Results  Component Value Date   HGBA1C 6.3 10/15/2022   Sugars well controlled Check A1c Continue metformin 500 mg twice daily Stressed regular exercise, diabetic diet

## 2023-06-08 NOTE — Assessment & Plan Note (Signed)
Chronic Regular exercise and healthy diet encouraged Check lipid panel  Continue fenofibrate 145 mg daily, Crestor 5 mg twice weekly Stressed smoking cessation 

## 2023-06-08 NOTE — Assessment & Plan Note (Signed)
 Chronic Continue B12 supplementation Check B12 level

## 2023-06-26 ENCOUNTER — Ambulatory Visit: Payer: 59

## 2023-06-26 VITALS — Ht 66.0 in | Wt 163.0 lb

## 2023-06-26 DIAGNOSIS — Z122 Encounter for screening for malignant neoplasm of respiratory organs: Secondary | ICD-10-CM

## 2023-06-26 DIAGNOSIS — Z0000 Encounter for general adult medical examination without abnormal findings: Secondary | ICD-10-CM

## 2023-06-26 DIAGNOSIS — Z0100 Encounter for examination of eyes and vision without abnormal findings: Secondary | ICD-10-CM

## 2023-06-26 DIAGNOSIS — F17200 Nicotine dependence, unspecified, uncomplicated: Secondary | ICD-10-CM

## 2023-06-26 DIAGNOSIS — Z1231 Encounter for screening mammogram for malignant neoplasm of breast: Secondary | ICD-10-CM | POA: Diagnosis not present

## 2023-06-26 NOTE — Progress Notes (Signed)
 Subjective:   [name] is a 68 y.o. who presents for a Medicare Wellness preventive visit.  Visit Complete: Virtual I connected with  [name] on 06/26/23 by a audio enabled telemedicine application and verified that I am speaking with the correct person using two identifiers.  Patient Location: Home  Provider Location: Office/Clinic  I discussed the limitations of evaluation and management by telemedicine. The patient expressed understanding and agreed to proceed.  Vital Signs: Because this visit was a virtual/telehealth visit, some criteria may be missing or patient reported. Any vitals not documented were not able to be obtained and vitals that have been documented are patient reported.  VideoDeclined- This patient declined Librarian, academic. Therefore the visit was completed with audio only.  Persons Participating in Visit: Patient.  AWV Questionnaire: No: Patient Medicare AWV questionnaire was not completed prior to this visit.  Cardiac Risk Factors include: advanced age (>38men, >44 women);diabetes mellitus;dyslipidemia;hypertension;smoking/ tobacco exposure     Objective:    Today's Vitals   06/26/23 0816  Weight: 163 lb (73.9 kg)  Height: 5\' 6"  (1.676 m)   Body mass index is 26.31 kg/m.     06/26/2023    8:13 AM 03/24/2022   11:35 AM 03/22/2021    8:25 AM 11/10/2020   11:02 AM 01/02/2020   10:24 AM 03/25/2017   10:44 AM 10/20/2016    1:54 PM  Advanced Directives  Does Patient Have a Medical Advance Directive? Yes No No No No No No  Type of Estate agent of [place];Living will        Copy of Healthcare Power of Attorney in Chart? No - copy requested        Would patient like information on creating a medical advance directive?  No - Patient declined No - Patient declined  No - Patient declined No - Patient declined     Current Medications (verified) Outpatient Encounter Medications  as of 06/26/2023  Medication Sig   ACCU-CHEK GUIDE test strip USE AS DIRECTED   ALPRAZolam (XANAX) 0.5 MG tablet TAKE 1 TABLET BY MOUTH TWICE  DAILY AS NEEDED FOR ANXIETY   Blood Glucose Monitoring Suppl (ACCU-CHEK GUIDE) w/Device KIT USE TO CHECK BLOOD SUGAR  ONCE DAILY AND AS NEEDED   colchicine 0.6 MG tablet TAKE 2 TABLETS BY MOUTH FOR ONE DOSE AND THEN 1 TABLET  BY MOUTH 1 HOUR LATER FOR   GOUT FLARE   fenofibrate (TRICOR) 145 MG tablet Take 1 tablet (145 mg total) by mouth daily.   gabapentin (NEURONTIN) 600 MG tablet TAKE 1 TABLET BY MOUTH 3 TIMES  DAILY   hydrochlorothiazide (HYDRODIURIL) 12.5 MG tablet TAKE 1 TABLET BY MOUTH DAILY   Lancets (ONETOUCH ULTRASOFT) lancets Use as directed to check sugars once daily and as needed.  Dx E11.43   lisinopril (ZESTRIL) 10 MG tablet TAKE 1 TABLET BY MOUTH DAILY   metFORMIN (GLUCOPHAGE) 500 MG tablet TAKE 1 TABLET BY MOUTH TWICE  DAILY WITH MEALS   Ostomy Supplies (ODOR ELIMINATOR) LIQD UAD   rosuvastatin (CRESTOR) 5 MG tablet TAKE 1 TABLET BY MOUTH TWICE  WEEKLY   vitamin B-12 (CYANOCOBALAMIN) 500 MCG tablet Take 1,500 mcg by mouth daily.   [DISCONTINUED] varenicline (CHANTIX CONTINUING MONTH PAK) 1 MG tablet Take 1 tablet (1 mg total) by mouth 2 (two) times daily.   No facility-administered encounter medications on file as of 06/26/2023.    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   LAR surg 02/2009, chemo thru 09/2009   Tibial plateau fracture, left 01/19/2014   Past Surgical History:  Procedure Laterality Date   APPENDECTOMY N/A 01/09/2015   Procedure: APPENDECTOMY;  Surgeon: Glenna Fellows, MD;  Location: [facility];  Service: General;  Laterality: N/A;   COLONOSCOPY  01/09/2015   [facility]   COLOSTOMY  2013   [facility]   FINGER FRACTURE SURGERY Left    and hand surgery   HERNIA REPAIR     LAPAROSCOPIC PARASTOMAL HERNIA N/A 05/12/2013   Procedure: LAPAROSCOPIC PARASTOMAL HERNIA;  Surgeon: Romie Levee, MD;  Location: [facility];  Service: General;  Laterality: N/A;   LAPAROTOMY N/A 01/09/2015   Procedure: EXPLORATORY LAPAROTOMY;  Surgeon: Glenna Fellows, MD;  Location: [facility];  Service: General;  Laterality: N/A;  RESECTION OF COLOSTOMY, CREATION OF NEW COLOSTOMY, VENTRAL HERNIA REPAIR WITH BIOLOGIC MESH,  LYSIS OF ADHESIONS , OMENTECTOMY   LOW ANTERIOR BOWEL RESECTION  03/07/09   MOUTH SURGERY  10/2009   PORTACATH PLACEMENT     and removal   Family History  Problem Relation Age of Onset   Colon cancer Mother 71   Diabetes Father    Cancer Sister    Stomach cancer Sister    Colon polyps Sister    Cirrhosis Brother    Pulmonary Hypertension Brother    Esophageal cancer Neg Hx    Rectal cancer Neg Hx    Social History   Socioeconomic History   Marital status: Divorced    Spouse name: Not on file   Number of children: 0   Years of education: Not on file   Highest education level: Not on file  Occupational History   Occupation: disabiled  Tobacco Use   Smoking status: Every Day    Current packs/day: 0.25    Average packs/day: 0.3 packs/day for 53.2 years (13.3 ttl pk-yrs)    Types: Cigarettes    Start date: 04/14/1970    Passive exposure: Current   Smokeless tobacco: Never   Tobacco comments:    Pt. in project free program, motivated to stop smoking completely. Declined smoking cessation [facility] program.  Vaping Use   Vaping  status: Never Used  Substance and Sexual Activity   Alcohol use: Yes    Alcohol/week: 3.0 standard drinks of alcohol    Types: 1 Cans of beer, 2 Shots of liquor per week    Comment: occ   Drug use: Yes    Frequency: 1.0 times per week    Types: Marijuana, Cocaine    Comment: Pt is using Marijuana; last use of cocaine nov 2014    Sexual activity: Not Currently  Other Topics Concern   Not on file  Social History Narrative   Divorced, has significant other for past 30 years; values spending time with 2 grandsons and 26 year old dog who lives  with significant other   Frequent moves between family with stressors in relationship   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[employer], 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, 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   Social Drivers of Health   Financial Resource Strain: Low Risk  (06/26/2023)   Overall Financial Resource Strain (CARDIA)    Difficulty of Paying Living Expenses: Not hard at all  Food Insecurity: No Food Insecurity (06/26/2023)   Hunger Vital Sign    Worried About Running Out of Food in the Last Year: Never true    Ran Out of Food in the Last Year: Never true  Transportation Needs: No Transportation Needs (06/26/2023)   PRAPARE - Administrator, Civil Service (Medical): No    Lack of Transportation (Non-Medical): No  Physical Activity: Insufficiently Active (06/26/2023)   Exercise Vital Sign    Days of Exercise per Week: 3 days    Minutes of Exercise per Session: 30 min  Stress: No Stress Concern Present (06/26/2023)   [employer] of Occupational Health - Occupational Stress Questionnaire    Feeling of Stress : Not at all  Social Connections: Socially Isolated (06/26/2023)   Social Connection and Isolation Panel [NHANES]    Frequency of Communication with Friends and Family: More than three times a week    Frequency of Social Gatherings with Friends and Family: Twice a week    Attends  Religious Services: Never    Database administrator or Organizations: No    Attends Engineer, structural: Never    Marital Status: Divorced    Tobacco Counseling - Current Smoker Ready to quit: No Counseling given: Yes Tobacco comments: Pt. in project free program, motivated to stop smoking completely. Declined smoking cessation [facility] program. Lung Cancer Screening test ordered.   Clinical Intake:  Pre-visit preparation completed: Yes  Pain : No/denies pain     BMI - recorded: 26.31 Nutritional Risks: None Diabetes: Yes CBG done?: No Did pt. bring in CBG monitor from home?: No  How often do you need to have someone help you when you read instructions, pamphlets, or other written materials from your doctor or pharmacy?: 1 - Never  Interpreter Needed?: No  Information entered by :: Hassell Halim, CMA   Activities of Daily Living     06/26/2023    8:38 AM  In your present state of health, do you have any difficulty performing the following activities:  Hearing? 0  Vision? 0  Difficulty concentrating or making decisions? 0  Walking or climbing stairs? 0  Dressing or bathing? 0  Doing errands, shopping? 0  Preparing Food and eating ? N  Using the Toilet? N  In the past six months, have you accidently leaked urine? N  Do you have problems with loss of bowel control? N  Managing your Medications? N  Managing your Finances? N  Housekeeping or managing your Housekeeping? N    Patient Care Team: Pincus Sanes, MD as PCP - General (Internal Medicine) Ardell Isaacs, Forrestine Him, MD (Obstetrics and Gynecology) Sherin Quarry, DPM (Inactive) (Podiatry) Artis Delay, MD as Consulting Physician (Hematology and Oncology) Romie Levee, MD (General Surgery) [facility], Willaim Rayas, MD as Consulting Physician (Gastroenterology) Szabat, Vinnie Level, [facility] (Inactive) as Pharmacist (Pharmacist)  Indicate any recent Medical Services you may have received from other than [facility]  providers in the past year (date may be approximate).     Assessment:   This is  a routine wellness examination for [name].  Hearing/Vision screen Hearing Screening - Comments:: Denies hearing difficulties   Vision Screening - Comments:: Wears eyeglasses - Referral to [facility]   Goals Addressed               This Visit's Progress     Patient Stated (pt-stated)        Patient stated she wants to try to reduce smoking bc it affects her health.       Depression Screen     06/26/2023    8:27 AM 04/16/2022    1:49 PM 03/24/2022   11:55 AM 10/01/2021    7:55 AM 03/22/2021    8:32 AM 10/03/2020   11:23 AM 01/02/2020   10:19 AM  PHQ 2/9 Scores  PHQ - 2 Score 0 0 0 1 0 0 1  PHQ- 9 Score 0 4  7       Fall Risk     06/26/2023    8:23 AM 04/16/2022    1:49 PM 03/24/2022   11:36 AM 10/01/2021    7:56 AM 03/22/2021    8:27 AM  Fall Risk   Falls in the past year? 1 1 1 1  0  Number falls in past yr: 0 0 0 1 0  Injury with Fall? 0 1 1 0 0  Risk for fall due to :  No Fall Risks     Follow up Falls evaluation completed;Falls prevention discussed Falls evaluation completed Falls evaluation completed  Falls evaluation completed    MEDICARE RISK AT HOME:  Medicare Risk at Home Any stairs in or around the home?: No If so, are there any without handrails?: No Home free of loose throw rugs in walkways, pet beds, electrical cords, etc?: Yes Adequate lighting in your home to reduce risk of falls?: Yes Life alert?: No Use of a cane, walker or w/c?: No Grab bars in the bathroom?: Yes Shower chair or bench in shower?: Yes Elevated toilet seat or a handicapped toilet?: No  TIMED UP AND GO:  Was the test performed?  No  Cognitive Function: 6CIT completed        06/26/2023    8:29 AM 03/24/2022   12:05 PM 01/02/2020   10:28 AM  6CIT Screen  What Year? 0 points 0 points 0 points  What month? 0 points 0 points 0 points  What time? 0 points 0 points 0 points  Count back from 20 0  points 0 points 0 points  Months in reverse 0 points 0 points 0 points  Repeat phrase 0 points 0 points 0 points  Total Score 0 points 0 points 0 points    Immunizations Immunization History  Administered Date(s) Administered   Fluad Quad(high Dose 65+) 04/16/2022   Influenza Whole 01/02/2020   Influenza,inj,Quad PF,6+ Mos 03/18/2013, 05/23/2014, 01/10/2016, 04/03/2017, 12/09/2017, 12/13/2018, 03/27/2021   PFIZER(Purple Top)SARS-COV-2 Vaccination 07/07/2019, 07/28/2019   PNEUMOCOCCAL CONJUGATE-20 10/01/2021   Pneumococcal Conjugate-13 05/23/2014   Pneumococcal Polysaccharide-23 06/12/2016   Td 05/01/2010, 03/12/2022    Screening Tests Health Maintenance  Topic Date Due   Zoster Vaccines- Shingrix (1 of 2) Never done   OPHTHALMOLOGY EXAM  08/12/2021   COVID-19 Vaccine (3 - 2024-25 season) 12/14/2022   HEMOGLOBIN A1C  04/17/2023   MAMMOGRAM  05/21/2023   INFLUENZA VACCINE  07/13/2023 (Originally 11/13/2022)   Diabetic kidney evaluation - eGFR measurement  10/15/2023   Diabetic kidney evaluation - Urine ACR  10/15/2023   FOOT EXAM  10/15/2023  Medicare Annual Wellness (AWV)  06/25/2024   DEXA SCAN  08/28/2024   Colonoscopy  03/03/2028   DTaP/Tdap/Td (3 - Tdap) 03/12/2032   Pneumonia Vaccine 30+ Years old  Completed   Hepatitis C Screening  Completed   HPV VACCINES  Aged Out    Health Maintenance  Health Maintenance Due  Topic Date Due   Zoster Vaccines- Shingrix (1 of 2) Never done   OPHTHALMOLOGY EXAM  08/12/2021   COVID-19 Vaccine (3 - 2024-25 season) 12/14/2022   HEMOGLOBIN A1C  04/17/2023   MAMMOGRAM  05/21/2023   Health Maintenance Items Addressed: 06/26/2023 Mammogram ordered, Lung Cancer Screening ordered, Referral sent to Optometry/Ophthalmology  Additional Screening:  Vision Screening: Recommended annual ophthalmology exams for early detection of glaucoma and other disorders of the eye.  Referral to [facility] done today.  Dental Screening: Recommended  annual dental exams for proper oral hygiene  Community Resource Referral / Chronic Care Management: CRR required this visit?  No   CCM required this visit?  No     Plan:     I have personally reviewed and noted the following in the patient's chart:   Medical and social history Use of alcohol, tobacco or illicit drugs  Current medications and supplements including opioid prescriptions. Patient is not currently taking opioid prescriptions. Functional ability and status Nutritional status Physical activity Advanced directives List of other physicians Hospitalizations, surgeries, and ER visits in previous 12 months Vitals Screenings to include cognitive, depression, and falls Referrals and appointments  In addition, I have reviewed and discussed with patient certain preventive protocols, quality metrics, and best practice recommendations. A written personalized care plan for preventive services as well as general preventive health recommendations were provided to patient.     Darreld Mclean, CMA   06/26/2023   After Visit Summary: (MyChart) Due to this being a telephonic visit, the after visit summary with patients personalized plan was offered to patient via MyChart   Notes: Please refer to Routing Comments.

## 2023-06-26 NOTE — Patient Instructions (Addendum)
 Ms. Horstman , Thank you for taking time to come for your Medicare Wellness Visit. I appreciate your ongoing commitment to your health goals. Please review the following plan we discussed and let me know if I can assist you in the future.   Referrals/Orders/Follow-Ups/Clinician Recommendations: Aim for 30 minutes of exercise or brisk walking, 6-8 glasses of water, and 5 servings of fruits and vegetables each day.   This is a list of the screening recommended for you and due dates:  Health Maintenance  Topic Date Due   Zoster (Shingles) Vaccine (1 of 2) Never done   Eye exam for diabetics  08/12/2021   COVID-19 Vaccine (3 - 2024-25 season) 12/14/2022   Hemoglobin A1C  04/17/2023   Mammogram  05/21/2023   Flu Shot  07/13/2023*   Yearly kidney function blood test for diabetes  10/15/2023   Yearly kidney health urinalysis for diabetes  10/15/2023   Complete foot exam   10/15/2023   Medicare Annual Wellness Visit  06/25/2024   DEXA scan (bone density measurement)  08/28/2024   Colon Cancer Screening  03/03/2028   DTaP/Tdap/Td vaccine (3 - Tdap) 03/12/2032   Pneumonia Vaccine  Completed   Hepatitis C Screening  Completed   HPV Vaccine  Aged Out  *Topic was postponed. The date shown is not the original due date.    Advanced directives: (Copy Requested) Please bring a copy of your health care power of attorney and living will to the office to be added to your chart at your convenience. You can mail to [facility] [address] [place], [place] [address] or email to [email or web address]  Next Medicare Annual Wellness Visit scheduled for next year: Yes

## 2023-07-09 ENCOUNTER — Encounter: Payer: Self-pay | Admitting: Internal Medicine

## 2023-07-09 NOTE — Patient Instructions (Addendum)

## 2023-07-09 NOTE — Progress Notes (Unsigned)
 Subjective:    Patient ID: [name], female    DOB: 1956-03-07, 68 y.o.   MRN: [id number]      HPI [name] is here for a Physical exam and her chronic medical problems.    CKD newbut not new - disucss  CrCl 30-59: 200-700 mg bid of gabapnetin    Medications and allergies reviewed with patient and updated if appropriate.  Current Outpatient Medications on File Prior to Visit  Medication Sig Dispense Refill   ACCU-CHEK GUIDE test strip USE AS DIRECTED 300 strip 3   ALPRAZolam (XANAX) 0.5 MG tablet TAKE 1 TABLET BY MOUTH TWICE  DAILY AS NEEDED FOR ANXIETY 180 tablet 0   Blood Glucose Monitoring Suppl (ACCU-CHEK GUIDE) w/Device KIT USE TO CHECK BLOOD SUGAR  ONCE DAILY AND AS NEEDED 1 kit 2   colchicine 0.6 MG tablet TAKE 2 TABLETS BY MOUTH FOR ONE DOSE AND THEN 1 TABLET  BY MOUTH 1 HOUR LATER FOR   GOUT FLARE 21 tablet 3   fenofibrate (TRICOR) 145 MG tablet Take 1 tablet (145 mg total) by mouth daily. 90 tablet 3   gabapentin (NEURONTIN) 600 MG tablet TAKE 1 TABLET BY MOUTH 3 TIMES  DAILY 300 tablet 2   hydrochlorothiazide (HYDRODIURIL) 12.5 MG tablet TAKE 1 TABLET BY MOUTH DAILY 100 tablet 2   Lancets (ONETOUCH ULTRASOFT) lancets Use as directed to check sugars once daily and as needed.  Dx E11.43 100 each 12   lisinopril (ZESTRIL) 10 MG tablet TAKE 1 TABLET BY MOUTH DAILY 100 tablet 2   metFORMIN (GLUCOPHAGE) 500 MG tablet TAKE 1 TABLET BY MOUTH TWICE  DAILY WITH MEALS 200 tablet 2   Ostomy Supplies (ODOR ELIMINATOR) LIQD UAD 240 mL 0   rosuvastatin (CRESTOR) 5 MG tablet TAKE 1 TABLET BY MOUTH TWICE  WEEKLY 29 tablet 2   vitamin B-12 (CYANOCOBALAMIN) 500 MCG tablet Take 1,500 mcg by mouth daily.     No current facility-administered medications on file prior to visit.    Review of Systems     Objective:  There were no vitals filed for this visit. There were no vitals filed for this visit. There is no height or weight on file to calculate BMI.  BP Readings  from Last 3 Encounters:  03/04/23 (!) 146/71  02/19/23 130/62  10/15/22 104/70    Wt Readings from Last 3 Encounters:  06/26/23 163 lb (73.9 kg)  03/04/23 163 lb (73.9 kg)  02/19/23 163 lb (73.9 kg)       Physical Exam Constitutional: She appears well-developed and well-nourished. No distress.  HENT:  Head: Normocephalic and atraumatic.  Right Ear: External ear normal. Normal ear canal and TM Left Ear: External ear normal.  Normal ear canal and TM Mouth/Throat: Oropharynx is clear and moist.  Eyes: Conjunctivae normal.  Neck: Neck supple. No tracheal deviation present. No thyromegaly present.  No carotid bruit  Cardiovascular: Normal rate, regular rhythm and normal heart sounds.   No murmur heard.  No edema. Pulmonary/Chest: Effort normal and breath sounds normal. No respiratory distress. She has no wheezes. She has no rales.  Breast: deferred   Abdominal: Soft. She exhibits no distension. There is no tenderness.  Lymphadenopathy: She has no cervical adenopathy.  Skin: Skin is warm and dry. She is not diaphoretic.  Psychiatric: She has a normal mood and affect. Her behavior is normal.     Lab Results  Component Value Date   WBC 9.5 02/11/2023   HGB 13.1 02/11/2023  HCT 41.5 02/11/2023   PLT 506.0 (H) 02/11/2023   GLUCOSE 123 (H) 10/15/2022   CHOL 152 10/15/2022   TRIG 82.0 10/15/2022   HDL 38.20 (L) 10/15/2022   LDLDIRECT 51.0 12/13/2018   LDLCALC 98 10/15/2022   ALT 13 10/15/2022   AST 20 10/15/2022   NA 135 10/15/2022   K 4.8 10/15/2022   CL 108 10/15/2022   CREATININE 1.27 (H) 10/15/2022   BUN 33 (H) 10/15/2022   CO2 22 10/15/2022   TSH 0.82 10/09/2015   INR 0.91 04/01/2013   HGBA1C 6.3 10/15/2022   MICROALBUR <0.7 10/15/2022         Assessment & Plan:   Physical exam: Screening blood work  ordered Exercise   Weight   Substance abuse  none   Reviewed recommended immunizations.   Health Maintenance  Topic Date Due   Zoster Vaccines-  Shingrix (1 of 2) Never done   OPHTHALMOLOGY EXAM  08/12/2021   COVID-19 Vaccine (3 - 2024-25 season) 12/14/2022   HEMOGLOBIN A1C  04/17/2023   MAMMOGRAM  05/21/2023   INFLUENZA VACCINE  07/13/2023 (Originally 11/13/2022)   Diabetic kidney evaluation - eGFR measurement  10/15/2023   Diabetic kidney evaluation - Urine ACR  10/15/2023   FOOT EXAM  10/15/2023   Medicare Annual Wellness (AWV)  06/25/2024   DEXA SCAN  08/28/2024   Colonoscopy  03/03/2028   DTaP/Tdap/Td (3 - Tdap) 03/12/2032   Pneumonia Vaccine 23+ Years old  Completed   Hepatitis C Screening  Completed   HPV VACCINES  Aged Out          See Problem List for Assessment and Plan of chronic medical problems.

## 2023-07-10 ENCOUNTER — Ambulatory Visit (INDEPENDENT_AMBULATORY_CARE_PROVIDER_SITE_OTHER): Payer: 59 | Admitting: Internal Medicine

## 2023-07-10 ENCOUNTER — Encounter: Payer: Self-pay | Admitting: Internal Medicine

## 2023-07-10 VITALS — BP 98/60 | HR 59 | Temp 98.6°F | Ht 66.0 in | Wt 158.0 lb

## 2023-07-10 DIAGNOSIS — E538 Deficiency of other specified B group vitamins: Secondary | ICD-10-CM

## 2023-07-10 DIAGNOSIS — E1143 Type 2 diabetes mellitus with diabetic autonomic (poly)neuropathy: Secondary | ICD-10-CM | POA: Diagnosis not present

## 2023-07-10 DIAGNOSIS — F419 Anxiety disorder, unspecified: Secondary | ICD-10-CM

## 2023-07-10 DIAGNOSIS — N1831 Chronic kidney disease, stage 3a: Secondary | ICD-10-CM | POA: Diagnosis not present

## 2023-07-10 DIAGNOSIS — K439 Ventral hernia without obstruction or gangrene: Secondary | ICD-10-CM | POA: Diagnosis not present

## 2023-07-10 DIAGNOSIS — Z933 Colostomy status: Secondary | ICD-10-CM | POA: Diagnosis not present

## 2023-07-10 DIAGNOSIS — Z8739 Personal history of other diseases of the musculoskeletal system and connective tissue: Secondary | ICD-10-CM

## 2023-07-10 DIAGNOSIS — F1010 Alcohol abuse, uncomplicated: Secondary | ICD-10-CM

## 2023-07-10 DIAGNOSIS — F17210 Nicotine dependence, cigarettes, uncomplicated: Secondary | ICD-10-CM | POA: Diagnosis not present

## 2023-07-10 DIAGNOSIS — I10 Essential (primary) hypertension: Secondary | ICD-10-CM

## 2023-07-10 DIAGNOSIS — E785 Hyperlipidemia, unspecified: Secondary | ICD-10-CM

## 2023-07-10 DIAGNOSIS — Z0000 Encounter for general adult medical examination without abnormal findings: Secondary | ICD-10-CM | POA: Diagnosis not present

## 2023-07-10 LAB — CBC WITH DIFFERENTIAL/PLATELET
Basophils Absolute: 0.1 10*3/uL (ref 0.0–0.1)
Basophils Relative: 1.1 % (ref 0.0–3.0)
Eosinophils Absolute: 0.2 10*3/uL (ref 0.0–0.7)
Eosinophils Relative: 2.1 % (ref 0.0–5.0)
HCT: 39.7 % (ref 36.0–46.0)
Hemoglobin: 12.7 g/dL (ref 12.0–15.0)
Lymphocytes Relative: 23 % (ref 12.0–46.0)
Lymphs Abs: 2.3 10*3/uL (ref 0.7–4.0)
MCHC: 32 g/dL (ref 30.0–36.0)
MCV: 91.9 fl (ref 78.0–100.0)
Monocytes Absolute: 0.8 10*3/uL (ref 0.1–1.0)
Monocytes Relative: 7.8 % (ref 3.0–12.0)
Neutro Abs: 6.7 10*3/uL (ref 1.4–7.7)
Neutrophils Relative %: 66 % (ref 43.0–77.0)
Platelets: 459 10*3/uL — ABNORMAL HIGH (ref 150.0–400.0)
RBC: 4.32 Mil/uL (ref 3.87–5.11)
RDW: 15.4 % (ref 11.5–15.5)
WBC: 10.2 10*3/uL (ref 4.0–10.5)

## 2023-07-10 LAB — COMPREHENSIVE METABOLIC PANEL WITH GFR
ALT: 11 U/L (ref 0–35)
AST: 17 U/L (ref 0–37)
Albumin: 4.3 g/dL (ref 3.5–5.2)
Alkaline Phosphatase: 77 U/L (ref 39–117)
BUN: 47 mg/dL — ABNORMAL HIGH (ref 6–23)
CO2: 22 meq/L (ref 19–32)
Calcium: 10.1 mg/dL (ref 8.4–10.5)
Chloride: 112 meq/L (ref 96–112)
Creatinine, Ser: 1.35 mg/dL — ABNORMAL HIGH (ref 0.40–1.20)
GFR: 40.79 mL/min — ABNORMAL LOW (ref >60.00)
Glucose, Bld: 118 mg/dL — ABNORMAL HIGH (ref 70–99)
Potassium: 5.2 meq/L — ABNORMAL HIGH (ref 3.5–5.1)
Sodium: 141 meq/L (ref 135–145)
Total Bilirubin: 0.3 mg/dL (ref 0.2–1.2)
Total Protein: 7.5 g/dL (ref 6.0–8.3)

## 2023-07-10 LAB — LIPID PANEL
Cholesterol: 139 mg/dL (ref 0–200)
HDL: 43.6 mg/dL (ref >39.00)
LDL Cholesterol: 78 mg/dL (ref 0–99)
NonHDL: 95.33
Total CHOL/HDL Ratio: 3
Triglycerides: 85 mg/dL (ref 0.0–149.0)
VLDL: 17 mg/dL (ref 0.0–40.0)

## 2023-07-10 LAB — MICROALBUMIN / CREATININE URINE RATIO
Creatinine,U: 92.1 mg/dL
Microalb Creat Ratio: UNDETERMINED mg/g (ref 0.0–30.0)
Microalb, Ur: <0.7 mg/dL

## 2023-07-10 LAB — URIC ACID: Uric Acid, Serum: 8.2 mg/dL — ABNORMAL HIGH (ref 2.4–7.0)

## 2023-07-10 LAB — VITAMIN B12: Vitamin B-12: 699 pg/mL (ref 211–911)

## 2023-07-10 MED ORDER — GABAPENTIN 600 MG PO TABS: 600.0000 mg | ORAL_TABLET | Freq: Two times a day (BID) | ORAL | 1 refills | Status: DC

## 2023-07-10 MED ORDER — METFORMIN HCL 500 MG PO TABS: 500.0000 mg | ORAL_TABLET | Freq: Two times a day (BID) | ORAL | 1 refills | Status: DC

## 2023-07-10 MED ORDER — ALPRAZOLAM 0.5 MG PO TABS: 0.5000 mg | ORAL_TABLET | Freq: Two times a day (BID) | ORAL | 0 refills | Status: DC | PRN

## 2023-07-10 MED ORDER — ROSUVASTATIN CALCIUM 5 MG PO TABS: ORAL_TABLET | ORAL | 1 refills | Status: DC

## 2023-07-10 NOTE — Assessment & Plan Note (Addendum)
 Chronic Blood pressure well controlled-overcontrolled Advised monitoring BP regularly at home-this is unusually low for her so I do not know how accurate it is Discussed BP being too high or too low can both be concerning for affecting her kidney function CMP, cbc Continue lisinopril 10 mg daily, hydrochlorothiazide 25 mg daily

## 2023-07-10 NOTE — Assessment & Plan Note (Signed)
 Chronic Secondary to surgery for rectal cancer Continue home care Will order all supplies as needed necessary for the colostomy

## 2023-07-10 NOTE — Assessment & Plan Note (Signed)
 Chronic  Lab Results  Component Value Date   HGBA1C 6.3 10/15/2022   Sugars well controlled Check A1c, urine microalbumin Continue metformin 500 mg twice daily Stressed regular exercise, diabetic diet

## 2023-07-10 NOTE — Assessment & Plan Note (Signed)
 Chronic Continue B12 supplementation Check B12 level

## 2023-07-10 NOTE — Assessment & Plan Note (Addendum)
 Chronic Infrequent flares Continue colchicine as needed Check uric acid level

## 2023-07-10 NOTE — Assessment & Plan Note (Signed)
 Chronic Large lower abdominal hernia Has skin issues underneath the hernia from shaving and rubbing and discomfort from the hernia itself Stress smoking cessation in order for surgery to consider repair

## 2023-07-10 NOTE — Assessment & Plan Note (Signed)
 Smoking cessation was discussed for 3 and half minutes.  The patient was counseled on the dangers of tobacco use, and was advised to quit and wants to quit .  Reviewed ways of quitting smoking including nicotine replacement, cold [place], weaning off cigarettes, and pharmacotherapy (wellbutrin and chantix).  She has not tolerated nicotine in the past and it did not work well.  She did try Chantix and that did help.  She does have some Chantix at home that is not expired and is willing to try that.    Discussed the importance for her kidney health the fact that she does want to have her abdominal hernia repair surgery and would need to stop smoking in order for that to happen

## 2023-07-10 NOTE — Assessment & Plan Note (Addendum)
 Chronic Regular exercise and healthy diet encouraged Check lipid panel, CMP Continue Crestor 5 mg twice weekly Discontinue fenofibrate-not sure how much this is actually helping-stressed to decreasing alcohol intake which can increase triglycerides  Stressed smoking cessation

## 2023-07-10 NOTE — Assessment & Plan Note (Signed)
 Chronic She is drinking more alcohol than recommended Stressed that she needs to cut back on her alcohol

## 2023-07-10 NOTE — Assessment & Plan Note (Signed)
Chronic Controlled, Stable Continue alprazolam 0.5 mg twice daily as needed

## 2023-07-10 NOTE — Assessment & Plan Note (Addendum)
 Chronic Advised avoiding NSAIDs, maintaining good water intake Stressed good blood pressure control, sugar control CBC, CMP Stressed smoking cessation, alcohol cessation and healthy diet to help preserve her kidney function

## 2023-07-11 LAB — HEMOGLOBIN A1C: Hgb A1c MFr Bld: 6.5 % (ref 4.6–6.5)

## 2023-07-12 ENCOUNTER — Encounter: Payer: Self-pay | Admitting: Internal Medicine

## 2023-07-13 NOTE — Progress Notes (Deleted)
 [name] D.[facility] [address] [place] [address] Phone: [phone]   Assessment and Plan:     There are no diagnoses linked to this encounter.  ***   Pertinent previous records reviewed include ***    Follow Up: ***     Subjective:   I, [name], am serving as a Neurosurgeon for [name]   Chief Complaint: continued pain    HPI:    07/30/2021 1. Pain in left buttock 2. Chronic bilateral low back pain with left-sided sciatica 3. DDD (degenerative disc disease), lumbar -Chronic with exacerbation, subsequent sports medicine visit - Minimal improvement after 2-week course of meloxicam and HEP with symptoms most consistent with lumbar etiology.  Differential includes spinal stenosis versus facet arthropathy - Patient has not significantly improved with >6 weeks conservative therapy, so we will proceed with lumbar MRI at this time.  Could consider epidural injection depending on MRI results -Discontinue meloxicam daily.  May use remainder as needed - Start Tylenol 500 to 1000 mg tablets 2-3 times a day for day-to-day pain relief   Pertinent previous records reviewed include none   Follow Up: - Patient has not significantly improved with >6 weeks conservative therapy, so we will proceed with lumbar MRI at this time.  Could consider epidural injection depending on MRI results    Updated 10/11/2021 [name] is a 68 y.o. female coming in with complaint of back pain. Saw [name] on 07/30/2021. Patient states she fell on Sunday. Pain increased over L ischial tuberosity and top of SI joint. Pain radiates down back of her leg. Has not been sleeping due to pain. Using Tylenol and gabapentin.    10/28/2021 Patient states she is still having the pain after b12, steroid , she was okay over the weekend now the pain is back , she isnt able to sleep   07/14/2023 Patient states  Relevant Historical  Information: Hypertension, DM type II,   Additional pertinent review of systems negative.   Current Outpatient Medications:    ACCU-CHEK GUIDE test strip, USE AS DIRECTED, Disp: 300 strip, Rfl: 3   ALPRAZolam (XANAX) 0.5 MG tablet, Take 1 tablet (0.5 mg total) by mouth 2 (two) times daily as needed. for anxiety, Disp: 180 tablet, Rfl: 0   Blood Glucose Monitoring Suppl (ACCU-CHEK GUIDE) w/Device KIT, USE TO CHECK BLOOD SUGAR  ONCE DAILY AND AS NEEDED, Disp: 1 kit, Rfl: 2   colchicine 0.6 MG tablet, TAKE 2 TABLETS BY MOUTH FOR ONE DOSE AND THEN 1 TABLET  BY MOUTH 1 HOUR LATER FOR   GOUT FLARE, Disp: 21 tablet, Rfl: 3   gabapentin (NEURONTIN) 600 MG tablet, Take 1 tablet (600 mg total) by mouth 2 (two) times daily., Disp: 200 tablet, Rfl: 1   hydrochlorothiazide (HYDRODIURIL) 12.5 MG tablet, TAKE 1 TABLET BY MOUTH DAILY, Disp: 100 tablet, Rfl: 2   Lancets (ONETOUCH ULTRASOFT) lancets, Use as directed to check sugars once daily and as needed.  Dx E11.43, Disp: 100 each, Rfl: 12   lisinopril (ZESTRIL) 10 MG tablet, TAKE 1 TABLET BY MOUTH DAILY, Disp: 100 tablet, Rfl: 2   metFORMIN (GLUCOPHAGE) 500 MG tablet, Take 1 tablet (500 mg total) by mouth 2 (two) times daily with a meal., Disp: 200 tablet, Rfl: 1   Ostomy Supplies (ODOR ELIMINATOR) LIQD, UAD, Disp: 240 mL, Rfl: 0   rosuvastatin (CRESTOR) 5 MG tablet, TAKE 1 TABLET BY MOUTH TWICE  WEEKLY, Disp: 29 tablet, Rfl: 1  vitamin B-12 (CYANOCOBALAMIN) 500 MCG tablet, Take 1,500 mcg by mouth daily., Disp: , Rfl:    Objective:     There were no vitals filed for this visit.    There is no height or weight on file to calculate BMI.    Physical Exam:    ***   Electronically signed by:  [name] D.[facility] 7:29 AM 07/13/23

## 2023-07-14 ENCOUNTER — Ambulatory Visit: Admitting: Sports Medicine

## 2023-07-15 ENCOUNTER — Telehealth: Payer: Self-pay | Admitting: Acute Care

## 2023-07-15 ENCOUNTER — Ambulatory Visit
Admission: RE | Admit: 2023-07-15 | Discharge: 2023-07-15 | Disposition: A | Discharge status: Home / Self Care | Source: Ambulatory Visit | Attending: Internal Medicine | Admitting: Internal Medicine

## 2023-07-15 DIAGNOSIS — Z87891 Personal history of nicotine dependence: Secondary | ICD-10-CM

## 2023-07-15 DIAGNOSIS — Z122 Encounter for screening for malignant neoplasm of respiratory organs: Secondary | ICD-10-CM

## 2023-07-15 DIAGNOSIS — Z1231 Encounter for screening mammogram for malignant neoplasm of breast: Secondary | ICD-10-CM

## 2023-07-15 DIAGNOSIS — F17210 Nicotine dependence, cigarettes, uncomplicated: Secondary | ICD-10-CM

## 2023-07-15 NOTE — Telephone Encounter (Signed)
 Lung Cancer Screening Narrative/Criteria Questionnaire (Cigarette Smokers Only- No Cigars/Pipes/vapes)   [name]   SDMV:08/17/2023 at 11:15am with Orpha Bur        11/26/1955   LDCT: 08/24/2023 at 8:00am at [facility]    68 y.o.   Phone: [phone]  Lung Screening Narrative (confirm age 41-77 yrs Medicare / 50-80 yrs Private pay insurance)   Insurance information:[employer]   Referring Provider:Dr. Lawerance Bach   This screening involves an initial phone call with a team member from our program. It is called a shared decision making visit. The initial meeting is required by  insurance and Medicare to make sure you understand the program. This appointment takes about 15-20 minutes to complete. You will complete the screening scan at your scheduled date/time.  This scan takes about 5-10 minutes to complete. You can eat and drink normally before and after the scan.  Criteria questions for Lung Cancer Screening:   Are you a current or former smoker? Current Age began smoking: 68yo   If you are a former smoker, what year did you quit smoking? N/A(within 15 yrs)   To calculate your smoking history, I need an accurate estimate of how many packs of cigarettes you smoked per day and for how many years. (Not just the number of PPD you are now smoking)   Years smoking 54 x Packs per day 1 = Pack years 54   (at least 20 pack yrs)   (Make sure they understand that we need to know how much they have smoked in the past, not just the number of PPD they are smoking now)  Do you have a personal history of cancer?  Yes - (type and when diagnosed - 5 yrs cancer free) colorectal - diagnosed in 2010, received chemo/radiation and surgical treatment, has been in remission.     Do you have a family history of cancer? Yes  (cancer type and and relative) mother - colon. Sister - breast.   Are you coughing up blood?  No  Have you had unexplained weight loss of 15 lbs or more in the last 6 months? No  It looks like you  meet all criteria.  When would be a good time for [place] to schedule you for this screening?   Additional information: N/A

## 2023-07-17 ENCOUNTER — Encounter: Payer: Self-pay | Admitting: Sports Medicine

## 2023-08-07 ENCOUNTER — Other Ambulatory Visit: Payer: Self-pay | Admitting: Internal Medicine

## 2023-08-17 ENCOUNTER — Ambulatory Visit: Admitting: Adult Health

## 2023-08-17 ENCOUNTER — Encounter: Payer: Self-pay | Admitting: Adult Health

## 2023-08-17 DIAGNOSIS — F17210 Nicotine dependence, cigarettes, uncomplicated: Secondary | ICD-10-CM

## 2023-08-17 NOTE — Progress Notes (Signed)
  Virtual Visit via Telephone Note  I connected with [name] , 08/17/23 11:24 AM by a telemedicine application and verified that I am speaking with the correct person using two identifiers.  Location: Patient: home Provider: home   I discussed the limitations of evaluation and management by telemedicine and the availability of in person appointments. The patient expressed understanding and agreed to proceed.   Shared Decision Making Visit Lung Cancer Screening Program [phone])   Eligibility: 68 y.o. Pack Years Smoking History Calculation = 54 pack years  (# packs/per year x # years smoked) Recent History of coughing up blood  no Unexplained weight loss? no ( >Than 15 pounds within the last 6 months ) Prior History Lung / other cancer no (Diagnosis within the last 5 years already requiring surveillance chest CT Scans). Smoking Status Current Smoker  Visit Components: Discussion included one or more decision making aids. YES Discussion included risk/benefits of screening. YES Discussion included potential follow up diagnostic testing for abnormal scans. YES Discussion included meaning and risk of over diagnosis. YES Discussion included meaning and risk of False Positives. YES Discussion included meaning of total radiation exposure. YES  Counseling Included: Importance of adherence to annual lung cancer LDCT screening. YES Impact of comorbidities on ability to participate in the program. YES Ability and willingness to under diagnostic treatment. YES  Smoking Cessation Counseling: Current Smokers:  Discussed importance of smoking cessation. yes Information about tobacco cessation classes and interventions provided to patient. yes Patient provided with "ticket" for LDCT Scan. yes Symptomatic Patient. NO Diagnosis Code: Tobacco Use Z72.0 Asymptomatic Patient yes  Counseling - 4 minutes of smoking cessation counseling  (CT Chest Lung Cancer Screening Low Dose  W/O CM) [id number]  Z12.2-Screening of respiratory organs Z87.891-Personal history of nicotine  dependence   [place] Dose 08/17/23

## 2023-08-17 NOTE — Patient Instructions (Signed)

## 2023-08-24 ENCOUNTER — Ambulatory Visit
Admission: RE | Admit: 2023-08-24 | Discharge: 2023-08-24 | Disposition: A | Discharge status: Home / Self Care | Source: Ambulatory Visit | Attending: Acute Care | Admitting: Acute Care

## 2023-08-24 DIAGNOSIS — Z122 Encounter for screening for malignant neoplasm of respiratory organs: Secondary | ICD-10-CM

## 2023-08-24 DIAGNOSIS — Z87891 Personal history of nicotine dependence: Secondary | ICD-10-CM

## 2023-08-24 DIAGNOSIS — F17210 Nicotine dependence, cigarettes, uncomplicated: Secondary | ICD-10-CM | POA: Diagnosis not present

## 2023-09-02 ENCOUNTER — Emergency Department (HOSPITAL_COMMUNITY)
Admission: EM | Admit: 2023-09-02 | Discharge: 2023-09-02 | Disposition: A | Discharge status: Home / Self Care | Attending: Emergency Medicine | Admitting: Emergency Medicine

## 2023-09-02 ENCOUNTER — Encounter (HOSPITAL_COMMUNITY): Payer: Self-pay

## 2023-09-02 ENCOUNTER — Ambulatory Visit: Payer: Self-pay

## 2023-09-02 ENCOUNTER — Emergency Department (HOSPITAL_COMMUNITY)

## 2023-09-02 DIAGNOSIS — M549 Dorsalgia, unspecified: Secondary | ICD-10-CM | POA: Diagnosis not present

## 2023-09-02 DIAGNOSIS — M5441 Lumbago with sciatica, right side: Secondary | ICD-10-CM | POA: Diagnosis not present

## 2023-09-02 DIAGNOSIS — Z79899 Other long term (current) drug therapy: Secondary | ICD-10-CM | POA: Insufficient documentation

## 2023-09-02 DIAGNOSIS — I129 Hypertensive chronic kidney disease with stage 1 through stage 4 chronic kidney disease, or unspecified chronic kidney disease: Secondary | ICD-10-CM | POA: Insufficient documentation

## 2023-09-02 DIAGNOSIS — M47816 Spondylosis without myelopathy or radiculopathy, lumbar region: Secondary | ICD-10-CM | POA: Diagnosis not present

## 2023-09-02 DIAGNOSIS — M48061 Spinal stenosis, lumbar region without neurogenic claudication: Secondary | ICD-10-CM | POA: Diagnosis not present

## 2023-09-02 DIAGNOSIS — Z7984 Long term (current) use of oral hypoglycemic drugs: Secondary | ICD-10-CM | POA: Insufficient documentation

## 2023-09-02 DIAGNOSIS — M438X6 Other specified deforming dorsopathies, lumbar region: Secondary | ICD-10-CM | POA: Diagnosis not present

## 2023-09-02 DIAGNOSIS — Z85048 Personal history of other malignant neoplasm of rectum, rectosigmoid junction, and anus: Secondary | ICD-10-CM | POA: Insufficient documentation

## 2023-09-02 DIAGNOSIS — N189 Chronic kidney disease, unspecified: Secondary | ICD-10-CM | POA: Diagnosis not present

## 2023-09-02 DIAGNOSIS — M545 Low back pain, unspecified: Secondary | ICD-10-CM | POA: Diagnosis not present

## 2023-09-02 MED ORDER — CYCLOBENZAPRINE HCL 10 MG PO TABS
5.0000 mg | ORAL_TABLET | Freq: Once | ORAL | Status: AC
  Administered 2023-09-02: 5 mg via ORAL
  Filled 2023-09-02: qty 1

## 2023-09-02 MED ORDER — CYCLOBENZAPRINE HCL 10 MG PO TABS: 10.0000 mg | ORAL_TABLET | Freq: Two times a day (BID) | ORAL | 0 refills | Status: AC | PRN

## 2023-09-02 MED ORDER — ACETAMINOPHEN 500 MG PO TABS: 1000.0000 mg | ORAL_TABLET | Freq: Four times a day (QID) | ORAL | 0 refills | Status: AC | PRN

## 2023-09-02 MED ORDER — ACETAMINOPHEN 500 MG PO TABS
1000.0000 mg | ORAL_TABLET | Freq: Once | ORAL | Status: AC
  Administered 2023-09-02: 1000 mg via ORAL
  Filled 2023-09-02: qty 2

## 2023-09-02 NOTE — Telephone Encounter (Signed)
  Chief Complaint: back pain Symptoms: back pain Frequency: one day Pertinent Negatives: Patient denies  Disposition: [] ED /[] Urgent Care (no appt availability in office) / [] Appointment(In office/virtual)/ []  [facility]/ [] Home Care/ [] Refused Recommended Disposition /[] [facility]/ [x]  Follow-up with PCP Additional Notes: patient called from ED discharge for back pain.  Displeased with treatment at ED.  Informed of reportable s/s and encouraged home remedies until appointment including using medications prescribed by ED.  Copied from CRM [id number]. Topic: Clinical - Red Word Triage >> Sep 02, 2023  3:32 PM Juleen Oakland F wrote: Red Word that prompted transfer to Nurse Triage: Patient having severe lower right back pain, she was seen at EMERGENCY DEPARTMENT AT [facility] today, x-ray didn't show anything and she's still having the same severe pain Reason for Disposition  [1] MODERATE back pain (e.g., interferes with normal activities) AND [2] present > 3 days  Answer Assessment - Initial Assessment Questions 1. ONSET: "When did the pain begin?"      Back pain  2. LOCATION: "Where does it hurt?" (upper, mid or lower back)     Right lower back 3. SEVERITY: "How bad is the pain?"  (e.g., Scale 1-10; mild, moderate, or severe)   - MILD (1-3): Doesn't interfere with normal activities.    - MODERATE (4-7): Interferes with normal activities or awakens from sleep.    - SEVERE (8-10): Excruciating pain, unable to do any normal activities.      10 4. PATTERN: "Is the pain constant?" (e.g., yes, no; constant, intermittent)      constant 5. RADIATION: "Does the pain shoot into your legs or somewhere else?"     Right leg 6. CAUSE:  "What do you think is causing the back pain?"      Patient states that she knows that she has a pinched nerve 7. BACK OVERUSE:  "Any recent lifting of heavy objects, strenuous work or exercise?"     no 8. MEDICINES: "What have you taken so far  for the pain?" (e.g., nothing, acetaminophen , NSAIDS)     Tylenol , muscle relaxer from [facility] 9. NEUROLOGIC SYMPTOMS: "Do you have any weakness, numbness, or problems with bowel/bladder control?"     no 10. OTHER SYMPTOMS: "Do you have any other symptoms?" (e.g., fever, abdomen pain, burning with urination, blood in urine)       no  Protocols used: Back Pain-A-AH

## 2023-09-02 NOTE — ED Provider Notes (Signed)
 [place] EMERGENCY DEPARTMENT AT [facility] Provider Note   CSN: [id number] Arrival date & time: 09/02/23  0945     History  Chief Complaint  Patient presents with   Back Pain   Leg Pain    [name] is a 69 y.o. female.  The history is provided by the patient and medical records. No language interpreter was used.  Back Pain Associated symptoms: leg pain   Leg Pain Associated symptoms: back pain      68 year old female history of lumbar radiculopathy, CKD, osteopenia, gout, rectosigmoid cancer, hypertension, cocaine abuse brought here via EMS from home with complaint of leg pain.  Patient report for the past several days she has had some lower back pain and today she endorsed pain radiates to her right leg.  Pain seems to be worse with movement and somewhat improved when she leans forward.  Pain is not out of control despite taking Tylenol  at home.  She mentioned that she has been diagnosed with radicular pain in the past and has had steroid injection to her spine 2 years prior for somewhat of a similar symptom.  She does not endorse any fever no bowel bladder incontinence or saddle anesthesia.  She has colostomy.    Home Medications Prior to Admission medications   Medication Sig Start Date End Date Taking? Authorizing Provider  ACCU-CHEK GUIDE test strip USE AS DIRECTED 06/26/22   Colene Dauphin, MD  ALPRAZolam  (XANAX ) 0.5 MG tablet Take 1 tablet (0.5 mg total) by mouth 2 (two) times daily as needed. for anxiety 07/10/23   Colene Dauphin, MD  Blood Glucose Monitoring Suppl (ACCU-CHEK GUIDE) w/Device KIT USE TO CHECK BLOOD SUGAR  ONCE DAILY AND AS NEEDED 07/24/22   Burns, Beckey Bourgeois, MD  colchicine  0.6 MG tablet TAKE 2 TABLETS BY MOUTH FOR ONE DOSE AND THEN 1 TABLET  BY MOUTH 1 HOUR LATER FOR   GOUT FLARE 09/27/21   Burns, Beckey Bourgeois, MD  gabapentin  (NEURONTIN ) 600 MG tablet Take 1 tablet (600 mg total) by mouth 2 (two) times daily. 07/10/23   Colene Dauphin, MD   hydrochlorothiazide  (HYDRODIURIL ) 12.5 MG tablet TAKE 1 TABLET BY MOUTH DAILY 06/08/23   Colene Dauphin, MD  Lancets [facility] ULTRASOFT) lancets Use as directed to check sugars once daily and as needed.  Dx W09.81 08/07/20   Colene Dauphin, MD  lisinopril  (ZESTRIL ) 10 MG tablet TAKE 1 TABLET BY MOUTH DAILY 06/05/23   Colene Dauphin, MD  metFORMIN  (GLUCOPHAGE ) 500 MG tablet Take 1 tablet (500 mg total) by mouth 2 (two) times daily with a meal. 07/10/23   Burns, Beckey Bourgeois, MD  Ostomy Supplies (ODOR ELIMINATOR) LIQD UAD 06/22/22   Colene Dauphin, MD  rosuvastatin  (CRESTOR ) 5 MG tablet TAKE 1 TABLET BY MOUTH TWICE  WEEKLY 07/10/23   Colene Dauphin, MD  vitamin B-12 (CYANOCOBALAMIN ) 500 MCG tablet Take 1,500 mcg by mouth daily.    [provider]      Allergies    Ampicillin, Hydrocodone, Penicillins, and Grapeseed extract [nutritional supplements]    Review of Systems   Review of Systems  Musculoskeletal:  Positive for back pain.  All other systems reviewed and are negative.   Physical Exam Updated Vital Signs BP (!) 158/68   Pulse 64   Temp 98.6 F (37 C) (Oral)   Resp 18   Ht 5\' 6"  (1.676 m)   Wt 71.7 kg   SpO2 97%   BMI 25.50  kg/m  Physical Exam Vitals and nursing note reviewed.  Constitutional:      General: She is not in acute distress.    Appearance: She is well-developed.  HENT:     Head: Atraumatic.  Eyes:     Conjunctiva/sclera: Conjunctivae normal.  Pulmonary:     Effort: Pulmonary effort is normal.  Abdominal:     Palpations: Abdomen is soft.     Tenderness: There is no abdominal tenderness. There is no right CVA tenderness or left CVA tenderness.  Musculoskeletal:     Cervical back: Neck supple.     Comments: Tenderness along lumbar and paralumbar spinal muscle on palpation.  Negative straight leg raise bilaterally.  Patellar deep tendon reflex intact bilaterally without any foot drop.  Skin:    Capillary Refill: Capillary refill takes less than 2 seconds.      Findings: No rash.  Neurological:     Mental Status: She is alert.  Psychiatric:        Mood and Affect: Mood normal.     ED Results / Procedures / Treatments   Labs (all labs ordered are listed, but only abnormal results are displayed) Labs Reviewed - No data to display  EKG None  Radiology DG Lumbar Spine Complete Result Date: 09/02/2023 CLINICAL DATA:  Back pain EXAM: LUMBAR SPINE - COMPLETE 4+ VIEW COMPARISON:  Lumbar spine x-ray 10/11/2021 FINDINGS: There is no evidence for lumbar spine fracture. There is mild dextroconvex curvature of the lumbar spine which is unchanged. Spinal alignment is normal. Degenerative disc space narrowing and endplate osteophyte formation is seen throughout the lumbar spine similar to the prior examination. This is most significant at L3-L4 and L5-S1. There are degenerative changes of facet joints at L5-S1. IMPRESSION: 1. No acute fracture or subluxation. 2. Multilevel degenerative changes of the lumbar spine, similar to the prior examination. Electronically Signed   By: Tyron Gallon M.D.   On: 09/02/2023 15:02    Procedures Procedures    Medications Ordered in ED Medications  acetaminophen  (TYLENOL ) tablet 1,000 mg (1,000 mg Oral Given 09/02/23 1144)  cyclobenzaprine  (FLEXERIL ) tablet 5 mg (5 mg Oral Given 09/02/23 1144)    ED Course/ Medical Decision Making/ A&P                                 Medical Decision Making Amount and/or Complexity of Data Reviewed Radiology: ordered.  Risk OTC drugs. Prescription drug management.   BP (!) 158/68   Pulse 64   Temp 98.6 F (37 C) (Oral)   Resp 18   Ht 5\' 6"  (1.676 m)   Wt 71.7 kg   SpO2 97%   BMI 25.50 kg/m   75:56 AM  68 year old female history of lumbar radiculopathy, CKD, osteopenia, gout, rectosigmoid cancer, hypertension, cocaine abuse brought here via EMS from home with complaint of leg pain.  Patient report for the past several days she has had some lower back pain and  today she endorsed pain radiates to her right leg.  Pain seems to be worse with movement and somewhat improved when she leans forward.  Pain is not out of control despite taking Tylenol  at home.  She mentioned that she has been diagnosed with radicular pain in the past and has had steroid injection to her spine 2 years prior for somewhat of a similar symptom.  She does not endorse any fever no bowel bladder incontinence or saddle anesthesia.  She  has colostomy.    On exam, patient is sitting leaning forward in no acute discomfort.  She does have some difficulty laying backwards endorse increasing lower back pain.  She does have some tenderness to the lumbar and paralumbar spine muscle.  She has negative straight leg raise.  She is neurovascularly intact.  Given her previous history of colorectal cancer, I will obtain x-ray of lumbar spine to assess for any potential malignancy causing her discomfort.  Doubt cauda equina.  Doubt kidney stone or dissection.  Patient given Tylenol  and muscle relaxant for symptom control.  X-ray lumbar spine obtained independent view interpreted by me which shows no acute fracture or subluxation.  Evidence of degenerative changes were noted but no new symptoms.  I discussed care with Dr. Gordon Latus.  I felt her symptom is likely muscle skeletal in origin.  Will provide supportive care and outpatient follow-up.  Return precaution given.  Patient able to ambulate.       Final Clinical Impression(s) / ED Diagnoses Final diagnoses:  Acute bilateral low back pain with right-sided sciatica    Rx / DC Orders ED Discharge Orders          Ordered    acetaminophen  (TYLENOL ) 500 MG tablet  Every 6 hours PRN        09/02/23 1511    cyclobenzaprine  (FLEXERIL ) 10 MG tablet  2 times daily PRN        09/02/23 1511              Debbra Fairy, PA-C 09/02/23 1512    Arvilla Birmingham, MD 09/02/23 1530

## 2023-09-02 NOTE — Discharge Instructions (Addendum)
 You have been evaluated for your symptoms.  Fortunately x-ray did not show any concerning finding.  Please take medication as prescribed as needed for your symptoms.  Follow-up with your doctor for further care.

## 2023-09-02 NOTE — ED Triage Notes (Signed)
 Per [facility], Pt, from home, c/o low back pain radiating down R leg starting last night.  Pain score 10/10.  Denies injury.  Pt was ambulatory in scene.   Pt has not taken anything for pain.

## 2023-09-03 ENCOUNTER — Encounter: Payer: Self-pay | Admitting: Internal Medicine

## 2023-09-03 NOTE — Patient Instructions (Addendum)
    You had a steroid and pain medication injection today    Medications changes include :   increase gabapentin  to 600 mg three times a day      Return if symptoms worsen or fail to improve.

## 2023-09-03 NOTE — Progress Notes (Signed)
 Subjective:    Patient ID: [name], female    DOB: [birth year], 68 y.o.   MRN: [id number]      HPI [name] is here for  Chief Complaint  Patient presents with   Back Pain    Pt states she is having back pain x 2 days , lower right side     Back pain: no back pain chronically.  She has a history of lumbar radiculopathy.  A couple of days ago her back pain increased. No injury or fall.  She has pain in her right lower back to the right knee.  No n/t.  No weakness in leg.  No changes in bowel or bladder.    Has been taking Es tylenol  and flexeril  - no relief.  Went to ED 5/21. xray without acute injury.     MRI lumbar spine (10/2021)- 1. L2-3 large inferiorly migrating disc extrusion causing severe spinal stenosis. 2. L3-4 advanced spinal and left foraminal stenosis. 3. L4-5 moderate spinal stenosis. Either L5 nerve root could be affected in the subarticular recesses. 4. L5-S1 severe right foraminal stenosis.   Medications and allergies reviewed with patient and updated if appropriate.  Current Outpatient Medications on File Prior to Visit  Medication Sig Dispense Refill   ACCU-CHEK GUIDE test strip USE AS DIRECTED 300 strip 3   acetaminophen  (TYLENOL ) 500 MG tablet Take 2 tablets (1,000 mg total) by mouth every 6 (six) hours as needed for mild pain (pain score 1-3) or moderate pain (pain score 4-6). 30 tablet 0   ALPRAZolam  (XANAX ) 0.5 MG tablet Take 1 tablet (0.5 mg total) by mouth 2 (two) times daily as needed. for anxiety 180 tablet 0   Blood Glucose Monitoring Suppl (ACCU-CHEK GUIDE) w/Device KIT USE TO CHECK BLOOD SUGAR  ONCE DAILY AND AS NEEDED 1 kit 2   colchicine  0.6 MG tablet TAKE 2 TABLETS BY MOUTH FOR ONE DOSE AND THEN 1 TABLET  BY MOUTH 1 HOUR LATER FOR   GOUT FLARE 21 tablet 3   cyclobenzaprine  (FLEXERIL ) 10 MG tablet Take 1 tablet (10 mg total) by mouth 2 (two) times daily as needed for muscle spasms. 20 tablet 0   gabapentin  (NEURONTIN ) 600 MG  tablet Take 1 tablet (600 mg total) by mouth 2 (two) times daily. 200 tablet 1   hydrochlorothiazide  (HYDRODIURIL ) 12.5 MG tablet TAKE 1 TABLET BY MOUTH DAILY 100 tablet 2   Lancets (ONETOUCH ULTRASOFT) lancets Use as directed to check sugars once daily and as needed.  Dx E11.43 100 each 12   lisinopril  (ZESTRIL ) 10 MG tablet TAKE 1 TABLET BY MOUTH DAILY 100 tablet 2   metFORMIN  (GLUCOPHAGE ) 500 MG tablet Take 1 tablet (500 mg total) by mouth 2 (two) times daily with a meal. 200 tablet 1   Ostomy Supplies (ODOR ELIMINATOR) LIQD UAD 240 mL 0   rosuvastatin  (CRESTOR ) 5 MG tablet TAKE 1 TABLET BY MOUTH TWICE  WEEKLY 29 tablet 1   vitamin B-12 (CYANOCOBALAMIN ) 500 MCG tablet Take 1,500 mcg by mouth daily.     No current facility-administered medications on file prior to visit.    Review of Systems     Objective:   Vitals:   09/04/23 0749  BP: 122/72  Pulse: 68  Temp: 98.8 F (37.1 C)  SpO2: 99%   BP Readings from Last 3 Encounters:  09/04/23 122/72  09/02/23 (!) 158/68  07/10/23 98/60   Wt Readings from Last 3 Encounters:  09/04/23 156 lb 9.6 oz (71 kg)  09/02/23 158 lb (71.7 kg)  07/10/23 158 lb (71.7 kg)   Body mass index is 25.28 kg/m.    Physical Exam Constitutional:      General: She is not in acute distress.    Appearance: Normal appearance. She is not ill-appearing.  HENT:     Head: Normocephalic and atraumatic.  Musculoskeletal:        General: Tenderness (mild tenderness right lower back) present.     Right lower leg: No edema.     Left lower leg: No edema.  Skin:    General: Skin is warm and dry.     Findings: No rash.  Neurological:     Mental Status: She is alert.     Sensory: No sensory deficit.     Motor: No weakness.     Comments: Positive right straight leg raise            Assessment & Plan:    See Problem List for Assessment and Plan of chronic medical problems.

## 2023-09-04 ENCOUNTER — Ambulatory Visit (INDEPENDENT_AMBULATORY_CARE_PROVIDER_SITE_OTHER): Admitting: Internal Medicine

## 2023-09-04 VITALS — BP 122/72 | HR 68 | Temp 98.8°F | Ht 66.0 in | Wt 156.6 lb

## 2023-09-04 DIAGNOSIS — M5416 Radiculopathy, lumbar region: Secondary | ICD-10-CM

## 2023-09-04 DIAGNOSIS — G622 Polyneuropathy due to other toxic agents: Secondary | ICD-10-CM | POA: Diagnosis not present

## 2023-09-04 MED ORDER — KETOROLAC TROMETHAMINE 60 MG/2ML IM SOLN
60.0000 mg | Freq: Once | INTRAMUSCULAR | Status: AC
  Administered 2023-09-04: 60 mg via INTRAMUSCULAR

## 2023-09-04 MED ORDER — METHYLPREDNISOLONE ACETATE 80 MG/ML IJ SUSP
80.0000 mg | Freq: Once | INTRAMUSCULAR | Status: AC
  Administered 2023-09-04: 80 mg via INTRAMUSCULAR

## 2023-09-04 MED ORDER — GABAPENTIN 600 MG PO TABS: 600.0000 mg | ORAL_TABLET | Freq: Three times a day (TID) | ORAL | Status: DC

## 2023-09-04 NOTE — Assessment & Plan Note (Addendum)
 Acute Right sided lumbar radiculopathy w/o weakness, n/t, or changes in bowel/bladder Has had episodes in the past Depo-medrol  80 mg IM, Toradol  60 mg IM Continue tylenol , flexeril  prn Already on Gapabentin - increase to 600 mg tid If no improvement will let me know so I can refer to sports medicine for possible epidural

## 2023-09-04 NOTE — Assessment & Plan Note (Addendum)
 Chronic Not ideally controlled Continue gabapentin  but increase back to 600 mg 3 times daily

## 2023-09-08 ENCOUNTER — Ambulatory Visit: Payer: Self-pay

## 2023-09-08 NOTE — Telephone Encounter (Signed)
  Chief Complaint: chronic lower back pain Symptoms: R > L Frequency: chronic, worsening x 1 week Pertinent Negatives: Patient denies fever, injuries/falls, CP, SOB, neurological sx Disposition: [] ED /[] Urgent Care (no appt availability in office) / [x] Appointment(In office/virtual)/ []  [facility]/ [] Home Care/ [] Refused Recommended Disposition /[] [facility]/ []  Follow-up with PCP Additional Notes: Pt c.o chronic lower back pain that has worsened in the past week. Pt reports recent ED visit and prescribed flexeril  with minimal relief and followed up with PCP 09/04/2023 and received steroid INJ. Pt reports INJ temporarily provided relief, but now pain is back. Pt is currently using OTC tylenol  with minimal/no relief, and flexeril  that makes her drowsy. Pt denies any neurological sx, CP, SOB, injuries or falls. Scheduled patient per protocol on 09/10/2023. Patient verbalized understanding and to call back with worsening symptoms.       Copied from CRM [id number]. Topic: Clinical - Red Word Triage >> Sep 08, 2023  2:18 PM Chuck Crater wrote: Red Word that prompted transfer to Nurse Triage: Patient states that the shot and medication that was given to her on Friday for his back pain is not helping. She is still having lower back extreme 8/10 pain on the right side. Reason for Disposition  [1] MODERATE back pain (e.g., interferes with normal activities) AND [2] present > 3 days  Answer Assessment - Initial Assessment Questions 1. ONSET: "When did the pain begin?"      Last Wednesday   Recent ED/PCP acute visit - was given steroid shot with some relief 2. LOCATION: "Where does it hurt?" (upper, mid or lower back)     Lower back pain 3. SEVERITY: "How bad is the pain?"  (e.g., Scale 1-10; mild, moderate, or severe)   - MILD (1-3): Doesn't interfere with normal activities.    - MODERATE (4-7): Interferes with normal activities or awakens from sleep.    - SEVERE (8-10):  Excruciating pain, unable to do any normal activities.      8-10/10 4. PATTERN: "Is the pain constant?" (e.g., yes, no; constant, intermittent)      constant 5. RADIATION: "Does the pain shoot into your legs or somewhere else?"     Initially started at lower back to buttocks down leg. Now lower back on R side down to R buttock only. 6. CAUSE:  "What do you think is causing the back pain?"      unknown 7. BACK OVERUSE:  "Any recent lifting of heavy objects, strenuous work or exercise?"     denies 8. MEDICINES: "What have you taken so far for the pain?" (e.g., nothing, acetaminophen , NSAIDS)     Rx: Flexeril , steroid shot OTC tylenol  9. 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 OTHER SYMPTOMS: "Do you have any other symptoms?" (e.g., fever, abdomen pain, burning with urination, blood in urine)       denies 11. PREGNANCY: "Is there any chance you are pregnant?" "When was your last menstrual period?"       N/a  Protocols used: Back Pain-A-AH

## 2023-09-09 ENCOUNTER — Telehealth: Payer: Self-pay | Admitting: Internal Medicine

## 2023-09-09 ENCOUNTER — Encounter: Payer: Self-pay | Admitting: Internal Medicine

## 2023-09-09 NOTE — Telephone Encounter (Signed)
 She is coming in tomorrow-back pain is not better.  They said coming to see me see if she would like us  to schedule her with sports medicine for tomorrow to see if they can help more with her pain

## 2023-09-09 NOTE — Telephone Encounter (Signed)
 Message left for patient.  If she calls back please cancel appointment and move her to see sports med (or) give her number to call for an appointment.

## 2023-09-10 ENCOUNTER — Ambulatory Visit: Admitting: Internal Medicine

## 2023-09-15 ENCOUNTER — Telehealth: Payer: Self-pay

## 2023-09-15 NOTE — Telephone Encounter (Signed)
 Spoke with patient. Advised results are still pending and we will contact her once received. Pt verbalized understanding.

## 2023-09-15 NOTE — Telephone Encounter (Signed)
 Copied from CRM [id number]. Topic: Clinical - Lab/Test Results >> Sep 14, 2023  4:02 PM [name] B wrote: Reason for CRM: Patient had a lung screening CT on 5/12. She would like a call to discuss her results. Please call.  Please advise.

## 2023-09-16 ENCOUNTER — Other Ambulatory Visit: Payer: Self-pay

## 2023-09-16 DIAGNOSIS — Z87891 Personal history of nicotine dependence: Secondary | ICD-10-CM

## 2023-09-16 DIAGNOSIS — F17210 Nicotine dependence, cigarettes, uncomplicated: Secondary | ICD-10-CM

## 2023-09-16 DIAGNOSIS — Z122 Encounter for screening for malignant neoplasm of respiratory organs: Secondary | ICD-10-CM

## 2023-09-24 ENCOUNTER — Telehealth: Payer: Self-pay | Admitting: Internal Medicine

## 2023-09-24 NOTE — Telephone Encounter (Signed)
 Copied from CRM [id number]. Topic: Clinical - Lab/Test Results >> Sep 24, 2023 12:39 PM [name] D wrote: Reason for CRM: Patient is calling to request the results of her lung cancer screening. Requesting call back today if possible.

## 2023-09-25 NOTE — Telephone Encounter (Signed)
 Message left for patient to follow up with Pulmonary since Dara Ear ordered the original scan for results.   If she calls back please have her follow up with Pulmonary.

## 2023-09-29 NOTE — Telephone Encounter (Signed)
 Copied from CRM [id number]. Topic: Clinical - Lab/Test Results >> Sep 29, 2023 12:12 PM Isabell A wrote: Reason for CRM: Patient calling to discuss her lung cancer screening results.    Callback number: [phone] Melven Stable)

## 2023-10-08 ENCOUNTER — Other Ambulatory Visit: Payer: Self-pay | Admitting: Internal Medicine

## 2023-10-08 DIAGNOSIS — F419 Anxiety disorder, unspecified: Secondary | ICD-10-CM

## 2023-10-08 NOTE — Telephone Encounter (Unsigned)
 Copied from CRM [id number]. Topic: Clinical - Medication Refill >> Oct 08, 2023 10:07 AM Marissa P wrote: Medication: ALPRAZolam  (XANAX ) 0.5 MG tablet  Has the patient contacted their pharmacy? Yes (Agent: If no, request that the patient contact the pharmacy for the refill. If patient does not wish to contact the pharmacy document the reason why and proceed with request.) (Agent: If yes, when and what did the pharmacy advise?)  This is the patient's preferred pharmacy:   [facility] - [place], [place] - [address] [place] [address] Phone: [phone] Fax: [phone]  Is this the correct pharmacy for this prescription? Yes If no, delete pharmacy and type the correct one.   Has the prescription been filled recently? No  Is the patient out of the medication? No  Has the patient been seen for an appointment in the last year OR does the patient have an upcoming appointment? Yes  Can we respond through MyChart? No  Agent: Please be advised that Rx refills may take up to 3 business days. We ask that you follow-up with your pharmacy.  PATIENT WOULD LIKE TO NOT USE MYCHART ONLY EMAILS.

## 2023-10-09 MED ORDER — ALPRAZOLAM 0.5 MG PO TABS: 0.5000 mg | ORAL_TABLET | Freq: Two times a day (BID) | ORAL | 0 refills | Status: DC | PRN

## 2024-01-05 ENCOUNTER — Ambulatory Visit: Payer: Self-pay

## 2024-01-05 NOTE — Telephone Encounter (Signed)
  FYI Only or Action Required?: FYI only for provider.  Patient was last seen in primary care on 09/04/2023 by Geofm Glade PARAS, MD.  Called Nurse Triage reporting Foot Pain.  Symptoms began several days ago.  Interventions attempted: Rest, hydration, or home remedies and Ice/heat application.  Symptoms are: unchanged.  Triage Disposition: See PCP When Office is Open (Within 3 Days) appointment already scheduled  Patient/caregiver understands and will follow disposition?:  Copied from CRM [id number]. Topic: Clinical - Red Word Triage >> Jan 05, 2024  3:14 PM Drema MATSU wrote: Red Word that prompted transfer to Nurse Triage: Patient woke up friday and couldnt walk on right foot. Patient states it is still swollen (beneath toes to above ankle today) and is hard to walk on. She wants to know if it is safe to work on if it is wrapped up. Reason for Disposition  [1] MODERATE pain (e.g., interferes with normal activities, limping) AND [2] present > 3 days  Answer Assessment - Initial Assessment Questions Patient denies injury, but admits to episodes of gout.   Denies shortness of breath or difficulty breathing.   1. ONSET: When did the pain start?      Four days ago 2. LOCATION: Where is the pain located?      Right foot pain and swelling 3. PAIN: How bad is the pain?    (Scale 1-10; or mild, moderate, severe)     Pain with walking 4. WORK OR EXERCISE: Has there been any recent work or exercise that involved this part of the body?      Aggravated with walking 5. CAUSE: What do you think is causing the foot pain?     unknown 6. OTHER SYMPTOMS: Do you have any other symptoms? (e.g., leg pain, rash, fever, numbness)     swelling 7. PREGNANCY: Is there any chance you are pregnant? When was your last menstrual period?     N/A Patient would like to know if it is safe to go to work.  Explained that without specific injury, it is safe to be up on her feet as much as she can  tolerate, she verbalizes understanding Will practice RICE (rest, ice, compression, elevation) And call back if swelling or pain worsens  Protocols used: Foot Pain-A-AH

## 2024-01-10 ENCOUNTER — Encounter: Payer: Self-pay | Admitting: Internal Medicine

## 2024-01-10 NOTE — Patient Instructions (Addendum)
      Blood work was ordered.       Medications changes include :   increase lexapro  to 20 mg      Return in about 6 months (around 07/10/2024) for Physical Exam.

## 2024-01-10 NOTE — Progress Notes (Unsigned)
 Subjective:    Patient ID: [name], female    DOB: April 09, 1956, 68 y.o.   MRN: [id number]     HPI [name] is here for follow up of her chronic medical problems.  Working part time 25 hr / week.  She walks to work - 8 minute walk.    Memory better - taking B12 daily.   Medications and allergies reviewed with patient and updated if appropriate.  Current Outpatient Medications on File Prior to Visit  Medication Sig Dispense Refill   ACCU-CHEK GUIDE test strip USE AS DIRECTED 300 strip 3   acetaminophen  (TYLENOL ) 500 MG tablet Take 2 tablets (1,000 mg total) by mouth every 6 (six) hours as needed for mild pain (pain score 1-3) or moderate pain (pain score 4-6). 30 tablet 0   Blood Glucose Monitoring Suppl (ACCU-CHEK GUIDE) w/Device KIT USE TO CHECK BLOOD SUGAR  ONCE DAILY AND AS NEEDED 1 kit 2   colchicine  0.6 MG tablet TAKE 2 TABLETS BY MOUTH FOR ONE DOSE AND THEN 1 TABLET  BY MOUTH 1 HOUR LATER FOR   GOUT FLARE 21 tablet 3   cyclobenzaprine  (FLEXERIL ) 10 MG tablet Take 1 tablet (10 mg total) by mouth 2 (two) times daily as needed for muscle spasms. 20 tablet 0   gabapentin  (NEURONTIN ) 600 MG tablet Take 1 tablet (600 mg total) by mouth 3 (three) times daily.     hydrochlorothiazide  (HYDRODIURIL ) 12.5 MG tablet TAKE 1 TABLET BY MOUTH DAILY 100 tablet 2   Lancets (ONETOUCH ULTRASOFT) lancets Use as directed to check sugars once daily and as needed.  Dx E11.43 100 each 12   lisinopril  (ZESTRIL ) 10 MG tablet TAKE 1 TABLET BY MOUTH DAILY 100 tablet 2   metFORMIN  (GLUCOPHAGE ) 500 MG tablet Take 1 tablet (500 mg total) by mouth 2 (two) times daily with a meal. 200 tablet 1   Ostomy Supplies (ODOR ELIMINATOR) LIQD UAD 240 mL 0   rosuvastatin  (CRESTOR ) 5 MG tablet TAKE 1 TABLET BY MOUTH TWICE  WEEKLY 29 tablet 1   vitamin B-12 (CYANOCOBALAMIN ) 500 MCG tablet Take 1,500 mcg by mouth daily.     No current facility-administered medications on file prior to visit.     Review  of Systems  Constitutional:  Negative for fever.  Respiratory:  Positive for cough (clear phlegm). Negative for shortness of breath and wheezing.   Cardiovascular:  Negative for chest pain, palpitations and leg swelling.  Gastrointestinal:  Negative for abdominal pain, blood in stool and nausea.       No gerd  Neurological:  Positive for light-headedness (if she stands too quick). Negative for headaches.       Objective:   Vitals:   01/11/24 0752  BP: 124/70  Pulse: 95  Temp: 98.2 F (36.8 C)  SpO2: 96%   BP Readings from Last 3 Encounters:  01/11/24 124/70  09/04/23 122/72  09/02/23 (!) 158/68   Wt Readings from Last 3 Encounters:  01/11/24 141 lb (64 kg)  09/04/23 156 lb 9.6 oz (71 kg)  09/02/23 158 lb (71.7 kg)   Body mass index is 22.76 kg/m.    Physical Exam Constitutional:      General: She is not in acute distress.    Appearance: Normal appearance.  HENT:     Head: Normocephalic and atraumatic.  Eyes:     Conjunctiva/sclera: Conjunctivae normal.  Cardiovascular:     Rate and Rhythm: Normal rate and regular rhythm.     Heart  sounds: Normal heart sounds.  Pulmonary:     Effort: Pulmonary effort is normal. No respiratory distress.     Breath sounds: Normal breath sounds. No wheezing.  Musculoskeletal:     Cervical back: Neck supple.     Right lower leg: No edema.     Left lower leg: No edema.  Lymphadenopathy:     Cervical: No cervical adenopathy.  Skin:    General: Skin is warm and dry.     Findings: No rash.  Neurological:     Mental Status: She is alert. Mental status is at baseline.  Psychiatric:        Mood and Affect: Mood normal.        Behavior: Behavior normal.       Diabetic Foot Exam - Simple   Simple Foot Form Diabetic Foot exam was performed with the following findings: Yes 01/11/2024  8:22 AM  Visual Inspection See comments: Yes Sensation Testing See comments: Yes Pulse Check Posterior Tibialis and Dorsalis pulse intact  bilaterally: Yes Comments No sensation to light touch b/l plantar surfaces b/l.  Callus formation b/l plantar feet.  onychomycosis      Lab Results  Component Value Date   WBC 10.2 07/10/2023   HGB 12.7 07/10/2023   HCT 39.7 07/10/2023   PLT 459.0 (H) 07/10/2023   GLUCOSE 118 (H) 07/10/2023   CHOL 139 07/10/2023   TRIG 85.0 07/10/2023   HDL 43.60 07/10/2023   LDLDIRECT 51.0 12/13/2018   LDLCALC 78 07/10/2023   ALT 11 07/10/2023   AST 17 07/10/2023   NA 141 07/10/2023   K 5.2 No hemolysis seen (H) 07/10/2023   CL 112 07/10/2023   CREATININE 1.35 (H) 07/10/2023   BUN 47 (H) 07/10/2023   CO2 22 07/10/2023   TSH 0.82 10/09/2015   INR 0.91 04/01/2013   HGBA1C 6.5 07/10/2023   MICROALBUR <0.7 07/10/2023     Assessment & Plan:    See Problem List for Assessment and Plan of chronic medical problems.

## 2024-01-11 ENCOUNTER — Ambulatory Visit: Admitting: Internal Medicine

## 2024-01-11 VITALS — BP 124/70 | HR 95 | Temp 98.2°F | Ht 66.0 in | Wt 141.0 lb

## 2024-01-11 DIAGNOSIS — Z7984 Long term (current) use of oral hypoglycemic drugs: Secondary | ICD-10-CM | POA: Diagnosis not present

## 2024-01-11 DIAGNOSIS — E785 Hyperlipidemia, unspecified: Secondary | ICD-10-CM

## 2024-01-11 DIAGNOSIS — E1140 Type 2 diabetes mellitus with diabetic neuropathy, unspecified: Secondary | ICD-10-CM | POA: Diagnosis not present

## 2024-01-11 DIAGNOSIS — Z23 Encounter for immunization: Secondary | ICD-10-CM

## 2024-01-11 DIAGNOSIS — E538 Deficiency of other specified B group vitamins: Secondary | ICD-10-CM | POA: Diagnosis not present

## 2024-01-11 DIAGNOSIS — E1159 Type 2 diabetes mellitus with other circulatory complications: Secondary | ICD-10-CM | POA: Diagnosis not present

## 2024-01-11 DIAGNOSIS — F419 Anxiety disorder, unspecified: Secondary | ICD-10-CM

## 2024-01-11 DIAGNOSIS — F17210 Nicotine dependence, cigarettes, uncomplicated: Secondary | ICD-10-CM

## 2024-01-11 DIAGNOSIS — E1169 Type 2 diabetes mellitus with other specified complication: Secondary | ICD-10-CM

## 2024-01-11 DIAGNOSIS — I152 Hypertension secondary to endocrine disorders: Secondary | ICD-10-CM

## 2024-01-11 DIAGNOSIS — N1832 Chronic kidney disease, stage 3b: Secondary | ICD-10-CM

## 2024-01-11 DIAGNOSIS — Z8739 Personal history of other diseases of the musculoskeletal system and connective tissue: Secondary | ICD-10-CM

## 2024-01-11 DIAGNOSIS — Z933 Colostomy status: Secondary | ICD-10-CM

## 2024-01-11 DIAGNOSIS — M5416 Radiculopathy, lumbar region: Secondary | ICD-10-CM | POA: Diagnosis not present

## 2024-01-11 DIAGNOSIS — G629 Polyneuropathy, unspecified: Secondary | ICD-10-CM | POA: Diagnosis not present

## 2024-01-11 DIAGNOSIS — E1143 Type 2 diabetes mellitus with diabetic autonomic (poly)neuropathy: Secondary | ICD-10-CM

## 2024-01-11 LAB — CBC WITH DIFFERENTIAL/PLATELET
Basophils Absolute: 0.1 K/uL (ref 0.0–0.1)
Basophils Relative: 0.8 % (ref 0.0–3.0)
Eosinophils Absolute: 0.1 K/uL (ref 0.0–0.7)
Eosinophils Relative: 1.6 % (ref 0.0–5.0)
HCT: 38.2 % (ref 36.0–46.0)
Hemoglobin: 12.6 g/dL (ref 12.0–15.0)
Lymphocytes Relative: 22.2 % (ref 12.0–46.0)
Lymphs Abs: 1.7 K/uL (ref 0.7–4.0)
MCHC: 33 g/dL (ref 30.0–36.0)
MCV: 89.4 fl (ref 78.0–100.0)
Monocytes Absolute: 0.6 K/uL (ref 0.1–1.0)
Monocytes Relative: 8.3 % (ref 3.0–12.0)
Neutro Abs: 5 K/uL (ref 1.4–7.7)
Neutrophils Relative %: 67.1 % (ref 43.0–77.0)
Platelets: 542 K/uL — ABNORMAL HIGH (ref 150.0–400.0)
RBC: 4.27 Mil/uL (ref 3.87–5.11)
RDW: 14.1 % (ref 11.5–15.5)
WBC: 7.4 K/uL (ref 4.0–10.5)

## 2024-01-11 LAB — COMPREHENSIVE METABOLIC PANEL WITH GFR
ALT: 12 U/L (ref 0–35)
AST: 15 U/L (ref 0–37)
Albumin: 4.1 g/dL (ref 3.5–5.2)
Alkaline Phosphatase: 96 U/L (ref 39–117)
BUN: 24 mg/dL — ABNORMAL HIGH (ref 6–23)
CO2: 24 meq/L (ref 19–32)
Calcium: 10.2 mg/dL (ref 8.4–10.5)
Chloride: 104 meq/L (ref 96–112)
Creatinine, Ser: 1.04 mg/dL (ref 0.40–1.20)
GFR: 55.58 mL/min — ABNORMAL LOW (ref >60.00)
Glucose, Bld: 105 mg/dL — ABNORMAL HIGH (ref 70–99)
Potassium: 4.2 meq/L (ref 3.5–5.1)
Sodium: 137 meq/L (ref 135–145)
Total Bilirubin: 0.4 mg/dL (ref 0.2–1.2)
Total Protein: 7.6 g/dL (ref 6.0–8.3)

## 2024-01-11 LAB — LIPID PANEL
Cholesterol: 167 mg/dL (ref 0–200)
HDL: 44.3 mg/dL (ref >39.00)
LDL Cholesterol: 97 mg/dL (ref 0–99)
NonHDL: 122.68
Total CHOL/HDL Ratio: 4
Triglycerides: 129 mg/dL (ref 0.0–149.0)
VLDL: 25.8 mg/dL (ref 0.0–40.0)

## 2024-01-11 LAB — VITAMIN D 25 HYDROXY (VIT D DEFICIENCY, FRACTURES): VITD: 10.7 ng/mL — ABNORMAL LOW (ref 30.00–100.00)

## 2024-01-11 LAB — VITAMIN B12: Vitamin B-12: >1500 pg/mL — ABNORMAL HIGH (ref 211–911)

## 2024-01-11 LAB — HEMOGLOBIN A1C: Hgb A1c MFr Bld: 6.5 % (ref 4.6–6.5)

## 2024-01-11 MED ORDER — ALPRAZOLAM 0.5 MG PO TABS: 0.5000 mg | ORAL_TABLET | Freq: Two times a day (BID) | ORAL | 1 refills | Status: AC | PRN

## 2024-01-11 NOTE — Assessment & Plan Note (Signed)
 Chronic Regular exercise and healthy diet encouraged Check lipid panel, CMP Continue Crestor  5 mg twice weekly Advised smoking cessation, alcohol cessation

## 2024-01-11 NOTE — Assessment & Plan Note (Addendum)
 Chronic Associated with diabetic neuropathy, hyperlipidemia, CKD 3B  Lab Results  Component Value Date   HGBA1C 6.5 07/10/2023   Sugars well controlled Check A1c, CMP, lipids Continue metformin  500 mg twice daily Stressed regular exercise, diabetic diet

## 2024-01-11 NOTE — Assessment & Plan Note (Signed)
 Chronic Fairly controlled Continue gabapentin  600 mg 3 times daily

## 2024-01-11 NOTE — Assessment & Plan Note (Signed)
 Chronic Secondary to surgery for rectal cancer Functioning well Continue home care Will order all supplies as needed necessary for the colostomy

## 2024-01-11 NOTE — Assessment & Plan Note (Signed)
Chronic Controlled, Stable Continue alprazolam 0.5 mg twice daily as needed

## 2024-01-11 NOTE — Assessment & Plan Note (Signed)
 Chronic Stage IIIb Advised avoiding NSAIDs, maintaining good water  intake Stressed good blood pressure control, sugar control CBC, CMP, vitamin D level Stressed smoking cessation, alcohol cessation and healthy diet to help preserve her kidney function

## 2024-01-11 NOTE — Assessment & Plan Note (Signed)
 Subacute  Having another flare on right side x 3-4 months Right sided lumbar radiculopathy w/o weakness, n/t, or changes in bowel/bladder Has had episodes in the past Continue tylenol , flexeril  prn Already on Gapabentin - taking 600 mg tid Make an appt with sports medicine - may need an epidural which worked well in the past

## 2024-01-11 NOTE — Assessment & Plan Note (Signed)
 Chronic Continue B12 supplementation Check B12 level

## 2024-01-11 NOTE — Assessment & Plan Note (Signed)
 Chronic Blood pressure well controlled Advised monitoring BP regularly at [facility] the importance of good control of BP CMP, cbc Continue lisinopril  10 mg daily, hydrochlorothiazide  12.5 mg daily

## 2024-01-11 NOTE — Assessment & Plan Note (Signed)
 Chronic Uric acid level not ideally controlled Deferred allopurinol Infrequent flares Continue colchicine  as needed  Lab Results  Component Value Date   LABURIC 8.2 (H) 07/10/2023

## 2024-01-14 ENCOUNTER — Ambulatory Visit: Payer: Self-pay | Admitting: Internal Medicine

## 2024-01-18 ENCOUNTER — Emergency Department (HOSPITAL_COMMUNITY)
Admission: EM | Admit: 2024-01-18 | Discharge: 2024-01-18 | Disposition: A | Discharge status: Home / Self Care | Source: Ambulatory Visit | Attending: Emergency Medicine | Admitting: Emergency Medicine

## 2024-01-18 ENCOUNTER — Telehealth: Payer: Self-pay

## 2024-01-18 ENCOUNTER — Emergency Department (HOSPITAL_COMMUNITY)

## 2024-01-18 ENCOUNTER — Encounter (HOSPITAL_COMMUNITY): Payer: Self-pay

## 2024-01-18 ENCOUNTER — Other Ambulatory Visit: Payer: Self-pay

## 2024-01-18 DIAGNOSIS — M438X2 Other specified deforming dorsopathies, cervical region: Secondary | ICD-10-CM | POA: Diagnosis not present

## 2024-01-18 DIAGNOSIS — M542 Cervicalgia: Secondary | ICD-10-CM | POA: Diagnosis not present

## 2024-01-18 DIAGNOSIS — M436 Torticollis: Secondary | ICD-10-CM | POA: Diagnosis not present

## 2024-01-18 DIAGNOSIS — Z79899 Other long term (current) drug therapy: Secondary | ICD-10-CM | POA: Insufficient documentation

## 2024-01-18 DIAGNOSIS — K627 Radiation proctitis: Secondary | ICD-10-CM | POA: Diagnosis not present

## 2024-01-18 DIAGNOSIS — M47812 Spondylosis without myelopathy or radiculopathy, cervical region: Secondary | ICD-10-CM | POA: Diagnosis not present

## 2024-01-18 HISTORY — DX: Type 2 diabetes mellitus without complications: E11.9

## 2024-01-18 MED ORDER — DEXAMETHASONE SODIUM PHOSPHATE 10 MG/ML IJ SOLN
10.0000 mg | Freq: Once | INTRAMUSCULAR | Status: AC
  Administered 2024-01-18: 10 mg via INTRAMUSCULAR
  Filled 2024-01-18: qty 1

## 2024-01-18 MED ORDER — OXYCODONE-ACETAMINOPHEN 5-325 MG PO TABS: 1.0000 | ORAL_TABLET | Freq: Three times a day (TID) | ORAL | 0 refills | Status: AC | PRN

## 2024-01-18 MED ORDER — DICLOFENAC EPOLAMINE 1.3 % EX PTCH: 1.0000 | MEDICATED_PATCH | Freq: Two times a day (BID) | CUTANEOUS | 1 refills | Status: AC

## 2024-01-18 MED ORDER — DICLOFENAC EPOLAMINE 1.3 % EX PTCH
1.0000 | MEDICATED_PATCH | Freq: Two times a day (BID) | CUTANEOUS | Status: DC
  Administered 2024-01-18: 1 via TRANSDERMAL
  Filled 2024-01-18: qty 1

## 2024-01-18 MED ORDER — OXYCODONE-ACETAMINOPHEN 5-325 MG PO TABS
1.0000 | ORAL_TABLET | Freq: Once | ORAL | Status: AC
  Administered 2024-01-18: 1 via ORAL
  Filled 2024-01-18: qty 1

## 2024-01-18 MED ORDER — PREDNISONE 20 MG PO TABS: 40.0000 mg | ORAL_TABLET | Freq: Every day | ORAL | 0 refills | Status: AC

## 2024-01-18 NOTE — Discharge Instructions (Signed)
 Please take all medication as prescribed and follow-up with your physician.  Return here for concerning changes in your condition.  An alternative to the diclofenac  prescribed patches is Salonpas medicated patches with methyl salicylate and lidocaine .

## 2024-01-18 NOTE — ED Triage Notes (Signed)
 Patient has had a headache along with neck pain that moves across her shoulders since Saturday. Cannot turn neck without pain. No numbness or tingling. Thought it was a crick in her neck but said it has not gone away. No nausea or vomiting.

## 2024-01-18 NOTE — Telephone Encounter (Signed)
 Copied from CRM [id number]. Topic: Clinical - Medical Advice >> Jan 18, 2024  1:29 PM Rea ORN wrote: Reason for CRM: Pt went to ED today and was prescribed new rx. Pt wants to know if she should take an additional day off work to let the new meds work in her system. Please call back to advise.

## 2024-01-18 NOTE — ED Provider Notes (Signed)
 [facility]PART[facility] AT [facility] Provider Note   CSN: [id number] Arrival date & time: 01/18/24  9145     Patient presents with: Neck Pain   [name] is a 68 y.o. female.   HPI Patient presents with neck pain.  Pain is C6/C7 area, nonradiating severe, worse with attempts at motion.  No nausea, vomiting, vision changes.  No weakness in her extremities.  No relief with OTC or gabapentin .    Prior to Admission medications   Medication Sig Start Date End Date Taking? Authorizing Provider  diclofenac  (FLECTOR) 1.3 % PT[facility] Place 1 patch onto the skin 2 (two) times daily. 01/18/24  Yes Garrick Charleston, MD  oxyCODONE -acetaminophen  (PERCOCET/ROXICET) 5-325 MG tablet Take 1 tablet by mouth every 8 (eight) hours as needed for severe pain (pain score 7-10). 01/18/24  Yes Garrick Charleston, MD  predniSONE (DELTASONE) 20 MG tablet Take 2 tablets (40 mg total) by mouth daily with breakfast. For the next four days 01/18/24  Yes Garrick Charleston, MD  ACCU-CHEK GUIDE test strip USE AS DIRECTED 06/26/22   Geofm Glade PARAS, MD  acetaminophen  (TYLENOL ) 500 MG tablet Take 2 tablets (1,000 mg total) by mouth every 6 (six) hours as needed for mild pain (pain score 1-3) or moderate pain (pain score 4-6). 09/02/23   Nivia Colon, PA-C  ALPRAZolam  (XANAX ) 0.5 MG tablet Take 1 tablet (0.5 mg total) by mouth 2 (two) times daily as needed. for anxiety 01/11/24   Geofm Glade PARAS, MD  Blood Glucose Monitoring Suppl (ACCU-CHEK GUIDE) w/Device KIT USE TO CHECK BLOOD SUGAR  ONCE DAILY AND AS NEEDED 07/24/22   Burns, Glade PARAS, MD  colchicine  0.6 MG tablet TAKE 2 TABLETS BY MOUTH FOR ONE DOSE AND THEN 1 TABLET  BY MOUTH 1 HOUR LATER FOR   GOUT FLARE 09/27/21   Burns, Glade PARAS, MD  cyclobenzaprine  (FLEXERIL ) 10 MG tablet Take 1 tablet (10 mg total) by mouth 2 (two) times daily as needed for muscle spasms. 09/02/23   Nivia Colon, PA-C  gabapentin  (NEURONTIN ) 600 MG tablet Take 1 tablet (600 mg total) by  mouth 3 (three) times daily. 09/04/23   Geofm Glade PARAS, MD  hydrochlorothiazide  (HYDRODIURIL ) 12.5 MG tablet TAKE 1 TABLET BY MOUTH DAILY 06/08/23   Geofm Glade PARAS, MD  Lancets [facility] ULTRASOFT) lancets Use as directed to check sugars once daily and as needed.  Dx Z88.56 08/07/20   Geofm Glade PARAS, MD  lisinopril  (ZESTRIL ) 10 MG tablet TAKE 1 TABLET BY MOUTH DAILY 06/05/23   Geofm Glade PARAS, MD  metFORMIN  (GLUCOPHAGE ) 500 MG tablet Take 1 tablet (500 mg total) by mouth 2 (two) times daily with a meal. 07/10/23   Burns, Glade PARAS, MD  Ostomy Supplies (ODOR ELIMINATOR) LIQD UAD 06/22/22   Geofm Glade PARAS, MD  rosuvastatin  (CRESTOR ) 5 MG tablet TAKE 1 TABLET BY MOUTH TWICE  WEEKLY 07/10/23   Geofm Glade PARAS, MD  vitamin B-12 (CYANOCOBALAMIN ) 500 MCG tablet Take 1,500 mcg by mouth daily.    [provider]    Allergies: Ampicillin, Hydrocodone, Penicillins, and Grapeseed extract [nutritional supplements]    Review of Systems  Updated Vital Signs BP 132/75 (BP Location: Left Arm)   Pulse 67   Temp 97.6 F (36.4 C) (Oral)   Resp 18   Ht 1.676 m (5' 6)   Wt 67.1 kg   SpO2 100%   BMI 23.89 kg/m   Physical Exam Vitals and nursing note reviewed.  Constitutional:  General: She is not in acute distress.    Appearance: She is well-developed.  HENT:     Head: Normocephalic and atraumatic.  Eyes:     Conjunctiva/sclera: Conjunctivae normal.  Neck:   Cardiovascular:     Rate and Rhythm: Regular rhythm.  Pulmonary:     Effort: Pulmonary effort is normal. No respiratory distress.     Breath sounds: No stridor.  Abdominal:     General: There is no distension.  Skin:    General: Skin is warm and dry.  Neurological:     Mental Status: She is alert and oriented to person, place, and time.     Cranial Nerves: No cranial nerve deficit.     Comments: Grip strength symmetric upper extremity strength symmetric, 5/5 face symmetric, speech clear.  Psychiatric:        Mood and Affect: Mood  normal.     (all labs ordered are listed, but only abnormal results are displayed) Labs Reviewed - No data to display  EKG: None  Radiology: DG Cervical Spine 2-3 Views Result Date: 01/18/2024 CLINICAL DATA:  pain at APPROX.C6-C7 w/o clear etiology. Hx of malignancy, poss mets? EXAM: CERVICAL SPINE - 2-3 VIEW COMPARISON:  None Available. FINDINGS: Anatomic cervical curvature. No spondylolisthesis. Vertebral body heights are maintained. No fracture or destructive lesion. The C1 lateral masses are symmetrically positioned around the odontoid process. Mild-moderate multilevel degenerative changes in the form of reduced intervertebral disc height, endplate sclerosis/irregularity, facet arthropathy and marginal osteophyte formation. Prevertebral soft tissues within normal limits. IMPRESSION: No acute osseous abnormality of the cervical spine. Mild-moderate multilevel degenerative changes. Electronically Signed   By: Ree Molt M.D.   On: 01/18/2024 10:23     Procedures   Medications Ordered in the ED  diclofenac  (FLECTOR) 1.3 % 1 patch (1 patch Transdermal Patch Applied 01/18/24 1015)  oxyCODONE -acetaminophen  (PERCOCET/ROXICET) 5-325 MG per tablet 1 tablet (has no administration in time range)  dexamethasone (DECADRON) injection 10 mg (10 mg Intramuscular Given 01/18/24 1014)                                    Medical Decision Making Adult female with a history of malignancy now presents with neck pain.  Symptoms may be musculoskeletal, given her history of malignancy, metastatic disease is a consideration.  Patient's preserved neurostatus is reassuring.  X-ray ordered, meds ordered.  Amount and/or Complexity of Data Reviewed Radiology: ordered and independent interpretation performed. Decision-making details documented in ED Course.  Risk Prescription drug management.   10:59 AM Patient in no distress, remained hemodynamically unremarkable, x-ray reviewed, no notable findings  aside from degenerative changes.  With no neurocomplaints, no hemodynamic instability, low suspicion for CNS pathology, patient will continue steroids, analgesics, which we discussed prior to provision, and follow-up with primary care as needed.      Final diagnoses:  Neck pain    ED Discharge Orders          Ordered    predniSONE (DELTASONE) 20 MG tablet  Daily with breakfast        01/18/24 1059    diclofenac  (FLECTOR) 1.3 % PT[facility]  2 times daily        01/18/24 1059    oxyCODONE -acetaminophen  (PERCOCET/ROXICET) 5-325 MG tablet  Every 8 hours PRN        01/18/24 1059               Garrick Charleston,  MD 01/18/24 1059

## 2024-01-20 NOTE — Telephone Encounter (Signed)
Attempted to reach patient but VM was full.

## 2024-01-31 ENCOUNTER — Other Ambulatory Visit: Payer: Self-pay | Admitting: Internal Medicine

## 2024-02-04 NOTE — Progress Notes (Deleted)
    [name] [address] [place] [address] Phone: [phone]   Assessment and Plan:     ***    Pertinent previous records reviewed include ***   Follow Up: ***     Subjective:   I, Kyree Fedorko, am serving as a Neurosurgeon for Doctor Morene Mace  Chief Complaint: pinched nerve   HPI:   02/05/2024 Patient is a 68 year old female with pinch nerve pain. Patient states   Relevant Historical Information: ***  Additional pertinent review of systems negative.   Current Outpatient Medications:    ACCU-CHEK GUIDE test strip, USE AS DIRECTED, Disp: 300 strip, Rfl: 3   acetaminophen  (TYLENOL ) 500 MG tablet, Take 2 tablets (1,000 mg total) by mouth every 6 (six) hours as needed for mild pain (pain score 1-3) or moderate pain (pain score 4-6)., Disp: 30 tablet, Rfl: 0   ALPRAZolam  (XANAX ) 0.5 MG tablet, Take 1 tablet (0.5 mg total) by mouth 2 (two) times daily as needed. for anxiety, Disp: 180 tablet, Rfl: 1   Blood Glucose Monitoring Suppl (ACCU-CHEK GUIDE) w/Device KIT, USE TO CHECK BLOOD SUGAR  ONCE DAILY AND AS NEEDED, Disp: 1 kit, Rfl: 2   colchicine  0.6 MG tablet, TAKE 2 TABLETS BY MOUTH FOR ONE DOSE AND THEN 1 TABLET  BY MOUTH 1 HOUR LATER FOR   GOUT FLARE, Disp: 21 tablet, Rfl: 3   cyclobenzaprine  (FLEXERIL ) 10 MG tablet, Take 1 tablet (10 mg total) by mouth 2 (two) times daily as needed for muscle spasms., Disp: 20 tablet, Rfl: 0   diclofenac  (FLECTOR) 1.3 % PTCH, Place 1 patch onto the skin 2 (two) times daily., Disp: 5 patch, Rfl: 1   gabapentin  (NEURONTIN ) 600 MG tablet, Take 1 tablet (600 mg total) by mouth 3 (three) times daily., Disp: , Rfl:    hydrochlorothiazide  (HYDRODIURIL ) 12.5 MG tablet, TAKE 1 TABLET BY MOUTH DAILY, Disp: 100 tablet, Rfl: 2   Lancets (ONETOUCH ULTRASOFT) lancets, Use as directed to check sugars once daily and as needed.  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 For the next four days, Disp: 8 tablet, Rfl: 0   rosuvastatin  (CRESTOR ) 5 MG tablet, TAKE 1 TABLET BY MOUTH TWICE  WEEKLY, Disp: 29 tablet, Rfl: 2   vitamin B-12 (CYANOCOBALAMIN ) 500 MCG tablet, Take 1,500 mcg by mouth daily., Disp: , Rfl:    Objective:     There were no vitals filed for this visit.    There is no height or weight on file to calculate BMI.    Physical Exam:    ***   Electronically signed by:  Odis Mace [name] 7:28 AM 02/04/24

## 2024-02-05 ENCOUNTER — Ambulatory Visit: Admitting: Sports Medicine

## 2024-02-24 ENCOUNTER — Other Ambulatory Visit: Payer: Self-pay | Admitting: Internal Medicine

## 2024-03-19 ENCOUNTER — Other Ambulatory Visit: Payer: Self-pay | Admitting: Internal Medicine

## 2024-07-12 ENCOUNTER — Encounter: Admitting: Internal Medicine

## 2024-07-12 ENCOUNTER — Ambulatory Visit
